# Patient Record
Sex: Male | Born: 1955 | Race: White | Hispanic: No | Marital: Married | Smoking: Never smoker
Health system: Southern US, Community
[De-identification: ages and names within clinical notes are randomized; demographics above are authoritative.]

## PROBLEM LIST (undated history)

## (undated) DIAGNOSIS — E119 Type 2 diabetes mellitus without complications: Secondary | ICD-10-CM

## (undated) DIAGNOSIS — C801 Malignant (primary) neoplasm, unspecified: Secondary | ICD-10-CM

## (undated) DIAGNOSIS — C159 Malignant neoplasm of esophagus, unspecified: Secondary | ICD-10-CM

## (undated) DIAGNOSIS — N189 Chronic kidney disease, unspecified: Secondary | ICD-10-CM

## (undated) DIAGNOSIS — M1990 Unspecified osteoarthritis, unspecified site: Secondary | ICD-10-CM

## (undated) DIAGNOSIS — I10 Essential (primary) hypertension: Secondary | ICD-10-CM

## (undated) DIAGNOSIS — E785 Hyperlipidemia, unspecified: Secondary | ICD-10-CM

## (undated) HISTORY — DX: Essential (primary) hypertension: I10

## (undated) HISTORY — DX: Hyperlipidemia, unspecified: E78.5

## (undated) HISTORY — DX: Type 2 diabetes mellitus without complications: E11.9

## (undated) HISTORY — DX: Malignant neoplasm of esophagus, unspecified: C15.9

---

## 2002-03-05 ENCOUNTER — Ambulatory Visit (HOSPITAL_COMMUNITY): Admission: RE | Admit: 2002-03-05 | Discharge: 2002-03-05 | Payer: Self-pay | Admitting: Internal Medicine

## 2002-03-05 ENCOUNTER — Encounter: Payer: Self-pay | Admitting: Internal Medicine

## 2006-03-27 ENCOUNTER — Ambulatory Visit: Payer: Self-pay | Admitting: Internal Medicine

## 2006-03-28 ENCOUNTER — Ambulatory Visit (HOSPITAL_COMMUNITY): Admission: RE | Admit: 2006-03-28 | Discharge: 2006-03-28 | Payer: Self-pay | Admitting: Internal Medicine

## 2006-03-28 ENCOUNTER — Ambulatory Visit: Payer: Self-pay | Admitting: Internal Medicine

## 2006-04-14 ENCOUNTER — Ambulatory Visit: Payer: Self-pay

## 2020-04-18 ENCOUNTER — Other Ambulatory Visit: Payer: Self-pay | Admitting: Internal Medicine

## 2020-04-19 ENCOUNTER — Other Ambulatory Visit: Payer: Self-pay | Admitting: Diagnostic Radiology

## 2020-04-19 ENCOUNTER — Other Ambulatory Visit: Payer: Self-pay | Admitting: Internal Medicine

## 2020-04-19 DIAGNOSIS — N281 Cyst of kidney, acquired: Secondary | ICD-10-CM

## 2020-04-19 DIAGNOSIS — N183 Chronic kidney disease, stage 3 unspecified: Secondary | ICD-10-CM

## 2020-05-11 ENCOUNTER — Ambulatory Visit
Admission: RE | Admit: 2020-05-11 | Discharge: 2020-05-11 | Disposition: A | Discharge status: Home / Self Care | Payer: 59 | Source: Ambulatory Visit | Attending: Internal Medicine | Admitting: Internal Medicine

## 2020-05-11 DIAGNOSIS — N281 Cyst of kidney, acquired: Secondary | ICD-10-CM

## 2020-05-11 MED ORDER — IOPAMIDOL (ISOVUE-300) INJECTION 61%
100.0000 mL | Freq: Once | INTRAVENOUS | Status: AC | PRN
  Administered 2020-05-11: 100 mL via INTRAVENOUS

## 2020-08-09 ENCOUNTER — Telehealth: Payer: Self-pay | Admitting: Oncology

## 2020-08-09 ENCOUNTER — Inpatient Hospital Stay: Payer: 59

## 2020-08-09 NOTE — Telephone Encounter (Signed)
Per Tanya/Patient (refused to wear a mask) - Canceled 3/23 Labs, 3/29 Follow Up Appt

## 2020-08-15 ENCOUNTER — Inpatient Hospital Stay: Payer: 59 | Admitting: Oncology

## 2021-03-22 ENCOUNTER — Telehealth: Payer: Self-pay | Admitting: Hematology and Oncology

## 2021-03-22 NOTE — Telephone Encounter (Signed)
Scheduled appt per 10/20 referral. Pt is aware of appt date and time.

## 2021-04-08 NOTE — Progress Notes (Signed)
  [facility] CONSULT NOTE  Patient Care Team: Patient, No Pcp Per (Inactive) as PCP - General (General Practice)  CHIEF COMPLAINTS/PURPOSE OF CONSULTATION:  Newly diagnosed protein electrophoresis abnormality  HISTORY OF PRESENTING ILLNESS:  Seth Burch 65 y.o. male is here because of recent diagnosis of  protein electrophoresis abnormality. He presents to the clinic today for initial evaluation and discussion of treatment options.  The entire work-up started because he has chronic kidney disease.  The differential was diabetic nephropathy versus any other etiology.  Work-up was performed which revealed an abnormal serum protein electrophoresis showing increased amounts of M protein.  This led to consultation for hematology.  I reviewed his records extensively and collaborated the history with the patient.  MEDICAL HISTORY:  Diabetes, diabetic nephropathy, hypertension  SURGICAL HISTORY:  no prior surgeries  SOCIAL HISTORY: Denies any tobacco or alcohol or recreational drug use He works as an Art gallery manager at [employer]. He does not have any exposure to chemicals.  FAMILY HISTORY: Grandmother may have passed away of cancer but he does not remember what kind.  No history of any blood disorders.    ALLERGIES:  has no allergies on file.  MEDICATIONS:    REVIEW OF SYSTEMS:   Constitutional: Denies fevers, chills or abnormal night sweats All other systems were reviewed with the patient and are negative.  PHYSICAL EXAMINATION: ECOG PERFORMANCE STATUS: 1 - Symptomatic but completely ambulatory  Vitals:   04/09/21 1527  BP: (!) 144/78  Pulse: 70  Resp: 18  Temp: 97.7 F (36.5 C)  SpO2: 99%   Filed Weights   04/09/21 1527  Weight: 219 lb 3.2 oz (99.4 kg)        RADIOGRAPHIC STUDIES: I have personally reviewed the radiological reports and agreed with the findings in the report.  ASSESSMENT AND PLAN:  MGUS (monoclonal gammopathy of  unknown significance) March 2021: M spike 0.7 g (kidney function started to decline, creatinine 2: Due to diabetes)    Counseling: I discussed with the patient the spectrum of disorders from MGUS to multiple myeloma. We discussed the role of plasma cells in producing immunoglobulins. We discussed structure of immunoglobulins on how they make up the heavy chains and the light chains. The light chains are Kappa and lambda. I discussed the difference between MGUS and multiple myeloma. MGUS is characterized by elevation monoclonal protein without any end organ damage. Multiple myeloma is associated with elevation monoclonal protein and end organ damage (hypercalcemia, renal dysfunction, anemia, bone lytic lesions ) along with a bone marrow showing greater than 10% plasma cells.  Workup recommended: 1. serum protein electrophoresis with immunofixation 2. kappa lambda ratio and beta-2 microglobulin 3. Bone survey   If the bone survey shows abnormality then we have to consider doing a bone marrow biopsy.  I will call him with the result of this test. The plan is to bring him back in 6 months with labs and a telephone visit 1 week afterwards to discuss results.   All questions were answered. The patient knows to call the clinic with any problems, questions or concerns.   Rulon Eisenmenger, MD, MPH 04/09/2021    I, Thana Ates, am acting as scribe for Nicholas Lose, MD.  I have reviewed the above documentation for accuracy and completeness, and I agree with the above.

## 2021-04-09 ENCOUNTER — Inpatient Hospital Stay: Payer: 59 | Attending: Hematology and Oncology | Admitting: Hematology and Oncology

## 2021-04-09 ENCOUNTER — Other Ambulatory Visit: Payer: Self-pay

## 2021-04-09 ENCOUNTER — Inpatient Hospital Stay: Payer: 59

## 2021-04-09 DIAGNOSIS — E1140 Type 2 diabetes mellitus with diabetic neuropathy, unspecified: Secondary | ICD-10-CM | POA: Insufficient documentation

## 2021-04-09 DIAGNOSIS — E1122 Type 2 diabetes mellitus with diabetic chronic kidney disease: Secondary | ICD-10-CM | POA: Diagnosis not present

## 2021-04-09 DIAGNOSIS — D472 Monoclonal gammopathy: Secondary | ICD-10-CM

## 2021-04-09 DIAGNOSIS — Z79899 Other long term (current) drug therapy: Secondary | ICD-10-CM | POA: Diagnosis not present

## 2021-04-09 DIAGNOSIS — N189 Chronic kidney disease, unspecified: Secondary | ICD-10-CM | POA: Insufficient documentation

## 2021-04-09 DIAGNOSIS — I129 Hypertensive chronic kidney disease with stage 1 through stage 4 chronic kidney disease, or unspecified chronic kidney disease: Secondary | ICD-10-CM | POA: Diagnosis not present

## 2021-04-09 NOTE — Assessment & Plan Note (Signed)
March 2021: M spike 0.7 g (kidney function started to decline, creatinine 2: Due to diabetes) was seen by Dr. Bobby Rumpf at [facility] (observation)

## 2021-04-10 LAB — KAPPA/LAMBDA LIGHT CHAINS
Kappa free light chain: 65.6 mg/L — ABNORMAL HIGH (ref 3.3–19.4)
Kappa, lambda light chain ratio: 5.56 — ABNORMAL HIGH (ref 0.26–1.65)
Lambda free light chains: 11.8 mg/L (ref 5.7–26.3)

## 2021-04-12 LAB — MULTIPLE MYELOMA PANEL, SERUM
Albumin SerPl Elph-Mcnc: 4 g/dL (ref 2.9–4.4)
Albumin/Glob SerPl: 1.4 (ref 0.7–1.7)
Alpha 1: 0.1 g/dL (ref 0.0–0.4)
Alpha2 Glob SerPl Elph-Mcnc: 0.9 g/dL (ref 0.4–1.0)
B-Globulin SerPl Elph-Mcnc: 1.1 g/dL (ref 0.7–1.3)
Gamma Glob SerPl Elph-Mcnc: 0.9 g/dL (ref 0.4–1.8)
Globulin, Total: 3 g/dL (ref 2.2–3.9)
IgA: 73 mg/dL (ref 61–437)
IgG (Immunoglobin G), Serum: 988 mg/dL (ref 603–1613)
IgM (Immunoglobulin M), Srm: 26 mg/dL (ref 20–172)
M Protein SerPl Elph-Mcnc: 0.7 g/dL — ABNORMAL HIGH
Total Protein ELP: 7 g/dL (ref 6.0–8.5)

## 2021-04-22 NOTE — Progress Notes (Incomplete)
°  HEMATOLOGY-ONCOLOGY TELEPHONE VISIT PROGRESS NOTE  I connected with Seth Burch on 04/23/2021 at  4:30 PM EST by telephone and verified that I am speaking with the correct person using two identifiers.  I discussed the limitations, risks, security and privacy concerns of performing an evaluation and management service by telephone and the availability of in person appointments.  I also discussed with the patient that there may be a patient responsible charge related to this service. The patient expressed understanding and agreed to proceed.   History of Present Illness: Seth Burch is a 65 y.o. male with above-mentioned history of protein electrophoresis abnormality. He presents via telephone for follow-up.   Observations/Objective:     Assessment Plan:  MGUS (monoclonal gammopathy of unknown significance) March 2021: M spike 0.7 g (kidney function started to decline, creatinine 2: Due to diabetes)   04/09/21: M Protein 0.7 gm, IgG Kappa, Kappa: 65.6, Ratio: 5.56  Bone Survey:Pending Discussed with the patient that the M-Protein has not changed and there fore we can see him again in 1 year with labs ahead of the visit.  RTC in 1 year    I discussed the assessment and treatment plan with the patient. The patient was provided an opportunity to ask questions and all were answered. The patient agreed with the plan and demonstrated an understanding of the instructions. The patient was advised to call back or seek an in-person evaluation if the symptoms worsen or if the condition fails to improve as anticipated.   Total time spent: *** mins including non-face to face time and time spent for planning, charting and coordination of care  Rulon Eisenmenger, MD 04/23/2021    I, Thana Ates, am acting as scribe for Nicholas Lose, MD.  {Add scribe attestation statement}

## 2021-04-22 NOTE — Assessment & Plan Note (Deleted)
March 2021: M spike 0.7 g (kidney function started to decline, creatinine 2: Due to diabetes)   04/09/21: M Protein 0.7 gm, IgG Kappa, Kappa: 65.6, Ratio: 5.56  Bone Survey:Pending Discussed with the patient that the M-Protein has not changed and there fore we can see him again in 1 year with labs ahead of the visit.  RTC in 1 year

## 2021-04-23 ENCOUNTER — Inpatient Hospital Stay: Payer: 59 | Admitting: Hematology and Oncology

## 2021-05-04 ENCOUNTER — Ambulatory Visit (HOSPITAL_COMMUNITY)
Admission: RE | Admit: 2021-05-04 | Discharge: 2021-05-04 | Disposition: A | Discharge status: Home / Self Care | Payer: 59 | Source: Ambulatory Visit | Attending: Hematology and Oncology | Admitting: Hematology and Oncology

## 2021-05-04 DIAGNOSIS — D472 Monoclonal gammopathy: Secondary | ICD-10-CM

## 2021-05-06 NOTE — Progress Notes (Signed)
°  HEMATOLOGY-ONCOLOGY TELEPHONE VISIT PROGRESS NOTE  I connected with Seth Burch on 05/07/2021 at 12:30 PM EST by telephone and verified that I am speaking with the correct person using two identifiers.  I discussed the limitations, risks, security and privacy concerns of performing an evaluation and management service by telephone and the availability of in person appointments.  I also discussed with the patient that there may be a patient responsible charge related to this service. The patient expressed understanding and agreed to proceed.   History of Present Illness: Seth Burch is a 65 y.o. male with above-mentioned history of  protein electrophoresis abnormality. He presents via telephone today for follow-up.  Observations/Objective: No new problems or concerns    Assessment Plan:  MGUS (monoclonal gammopathy of unknown significance) Lab Review March 2021: M spike 0.7 g (kidney function started to decline, creatinine 2: Due to diabetes) 04/09/21: M-Spike 0.7 gm, IgG Kappa, Kappa 65.6, Lambda 11.8, Ratio: 5.56   05/04/21: Bone Survey: Small possible lytic lesion on skull (this is related to prior trauma)   Plan: Labs in 1 year and follow-up a week later by telephone visit      I discussed the assessment and treatment plan with the patient. The patient was provided an opportunity to ask questions and all were answered. The patient agreed with the plan and demonstrated an understanding of the instructions. The patient was advised to call back or seek an in-person evaluation if the symptoms worsen or if the condition fails to improve as anticipated.   Total time spent: 11 mins including non-face to face time and time spent for planning, charting and coordination of care  Rulon Eisenmenger, MD 05/07/2021    I, Thana Ates, am acting as scribe for Nicholas Lose, MD.  I have reviewed the above documentation for accuracy and completeness, and I agree with the above.

## 2021-05-06 NOTE — Assessment & Plan Note (Signed)
Lab Review March 2021: M spike 0.7 g (kidney function started to decline, creatinine 2: Due to diabetes) 04/09/21: M-Spike 0.7 gm, IgG Kappa, Kappa 65.6, Lambda 11.8, Ratio: 5.56   05/04/21: Bone Survey: Small possible lytic lesion on skull  Workup recommended: Bone Marrow aspiration and Biopsy (Because of the lytic lesion)

## 2021-05-07 ENCOUNTER — Inpatient Hospital Stay: Payer: 59 | Attending: Hematology and Oncology | Admitting: Hematology and Oncology

## 2021-05-07 DIAGNOSIS — D472 Monoclonal gammopathy: Secondary | ICD-10-CM

## 2021-10-08 ENCOUNTER — Inpatient Hospital Stay: Payer: 59 | Attending: Hematology and Oncology

## 2021-10-08 ENCOUNTER — Other Ambulatory Visit: Payer: Self-pay | Admitting: *Deleted

## 2021-10-08 ENCOUNTER — Telehealth: Payer: Self-pay

## 2021-10-08 DIAGNOSIS — D472 Monoclonal gammopathy: Secondary | ICD-10-CM

## 2021-10-08 NOTE — Telephone Encounter (Signed)
Attempted to call pt regarding [place]/[facility] for lab appt today. LVM form pt to call back and R/S.

## 2021-10-10 NOTE — Progress Notes (Incomplete)
HEMATOLOGY-ONCOLOGY TELEPHONE VISIT PROGRESS NOTE  I connected with '@PTNAME'$ @ on 10/10/21 at  3:30 PM EDT by telephone and verified that I am speaking with the correct person using two identifiers.  I discussed the limitations, risks, security and privacy concerns of performing an evaluation and management service by telephone and the availability of in person appointments.  I also discussed with the patient that there may be a patient responsible charge related to this service. The patient expressed understanding and agreed to proceed.   History of Present Illness: Seth Burch is a 66 y.o. male with above-mentioned history of  protein electrophoresis abnormality. He presents via telephone today for follow-up.  Oncology History   No history exists.    REVIEW OF SYSTEMS:   Constitutional: Denies fevers, chills or abnormal weight loss Eyes: Denies blurriness of vision Ears, nose, mouth, throat, and face: Denies mucositis or sore throat Respiratory: Denies cough, dyspnea or wheezes Cardiovascular: Denies palpitation, chest discomfort Gastrointestinal:  Denies nausea, heartburn or change in bowel habits Skin: Denies abnormal skin rashes Lymphatics: Denies new lymphadenopathy or easy bruising Neurological:Denies numbness, tingling or new weaknesses Behavioral/Psych: Mood is stable, no new changes  Extremities: No lower extremity edema Breast: *** denies any pain or lumps or nodules in either breasts All other systems were reviewed with the patient and are negative. Observations/Objective:     Assessment Plan:  No problem-specific Assessment & Plan notes found for this encounter.    I discussed the assessment and treatment plan with the patient. The patient was provided an opportunity to ask questions and all were answered. The patient agreed with the plan and demonstrated an understanding of the instructions. The patient was advised to call back or seek an in-person evaluation if the  symptoms worsen or if the condition fails to improve as anticipated.   I provided *** minutes of non-face-to-face time during this encounter. Derrian Rodak Bryson Ha, CMA   I Gardiner Coins am scribing for Dr. Lindi Adie  ***

## 2021-10-16 ENCOUNTER — Inpatient Hospital Stay: Payer: 59 | Admitting: Hematology and Oncology

## 2021-10-16 DIAGNOSIS — D472 Monoclonal gammopathy: Secondary | ICD-10-CM

## 2021-10-16 NOTE — Progress Notes (Signed)
Patient did not respond to the phone call. He needs blood work first and then follow-up a week later to discuss results.

## 2021-10-16 NOTE — Assessment & Plan Note (Signed)
March 2021: M spike 0.7 g (kidney function started to decline, creatinine 2: Due to diabetes) 04/09/21: M-Spike 0.7 gm, IgG Kappa, Kappa 65.6, Lambda 11.8, Ratio: 5.56 05/04/21: Bone Survey: Small possible lytic lesion on skull (this is related to prior trauma)   Plan: Labs in 1 year and follow-up a week later by telephone visit

## 2022-01-05 IMAGING — CT CT ABD-PEL WO/W CM
3 of 7 series · 12 of 32 positions shown, 17 images · IV contrast (APPLIED)
Comparison: Ultrasound dated 04/03/2020

CLINICAL DATA: Renal cystic lesion workup

EXAM:
CT ABDOMEN AND PELVIS WITHOUT AND WITH CONTRAST
TECHNIQUE: Multidetector CT imaging of the abdomen and pelvis was performed
following the standard protocol before and following the bolus
administration of intravenous contrast.
CONTRAST:  100mL P49TPM-T33 IOPAMIDOL ([id number]) INJECTION 61%
Creatinine was obtained on site at [HOSPITAL] at [HOSPITAL].
Results: Creatinine 1.7 mg/dL.

[Series 2: abd w/(date) · axial · 0.90mm/px · z∈[-175,-82]mm · 2 of 94 slices shown]
[im 32/94  soft-tissue]
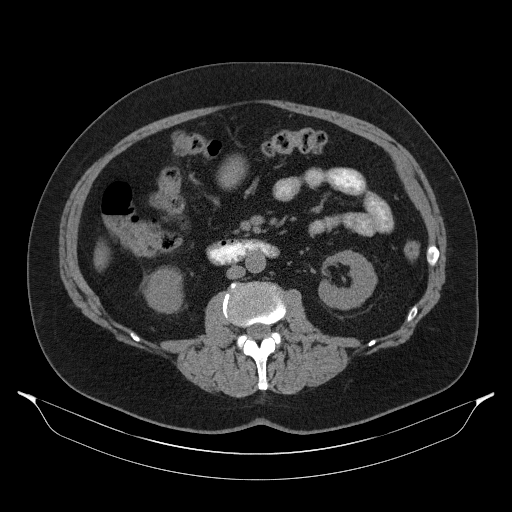
[im 63/94  soft-tissue]
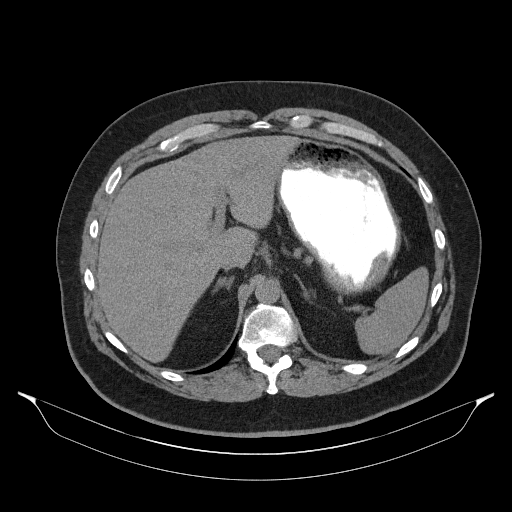

[Series 3: arterial · axial · arterial · 0.90mm/px · z∈[-199,-61]mm · 3 of 94 slices shown]
[im 24/94  soft-tissue]
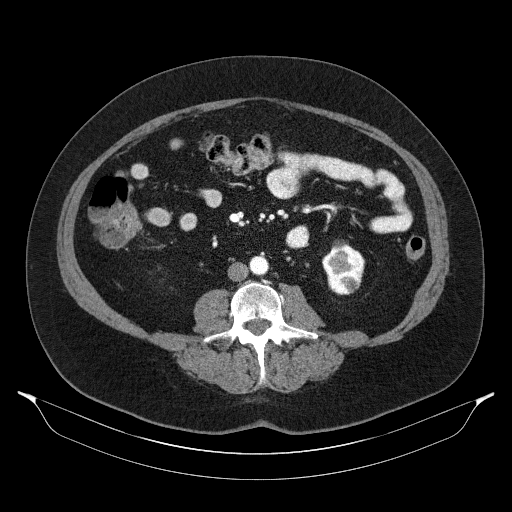
[im 47/94  soft-tissue]
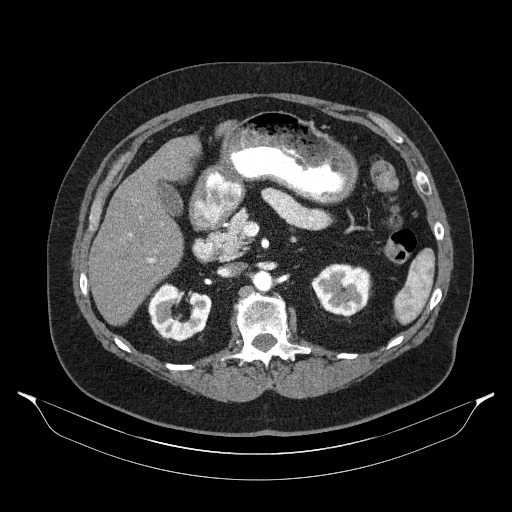
[im 70/94  soft-tissue]
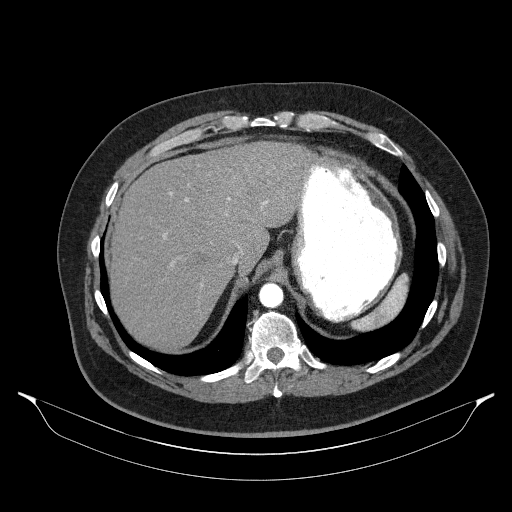

[Series 5: nephrographic · axial · 0.90mm/px · z∈[-412,-52]mm · 7 of 162 slices shown, 12 images]
[im 21/162  soft-tissue]
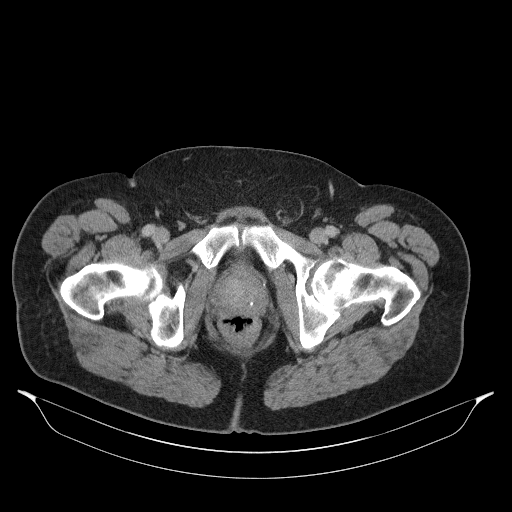
[im 21/162  bone]
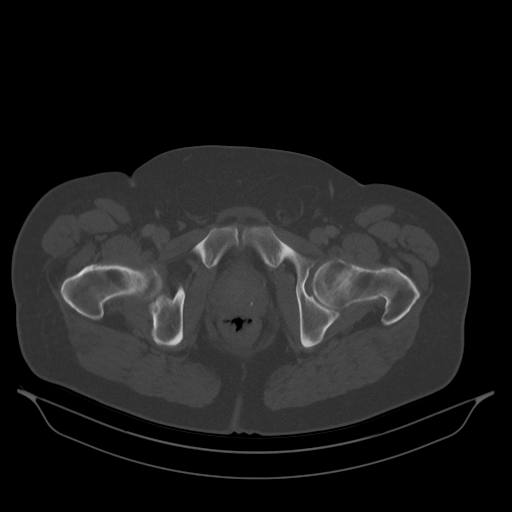
[im 41/162  soft-tissue]
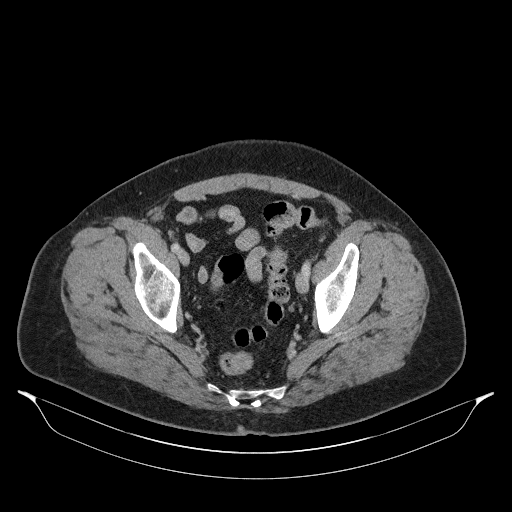
[im 61/162  soft-tissue]
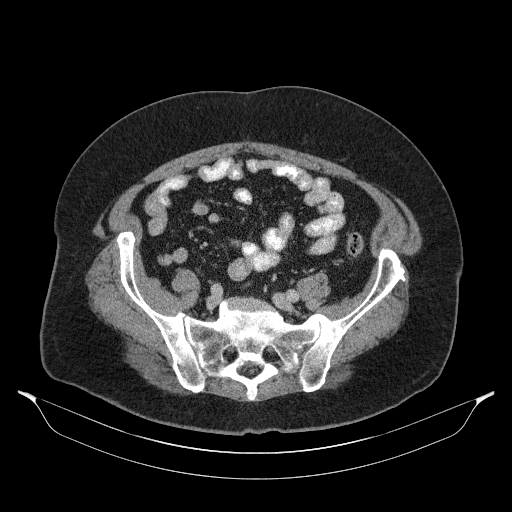
[im 81/162  soft-tissue]
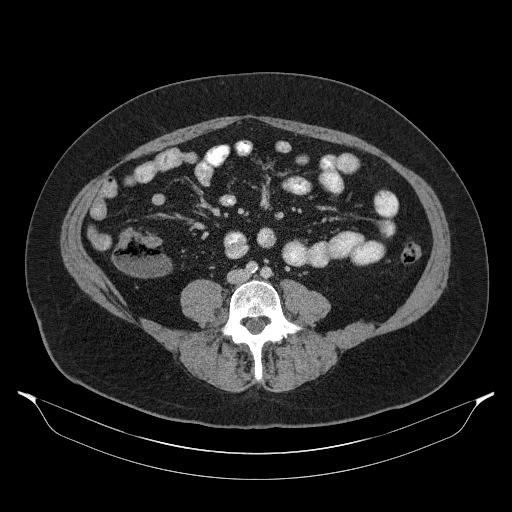
[im 81/162  lung]
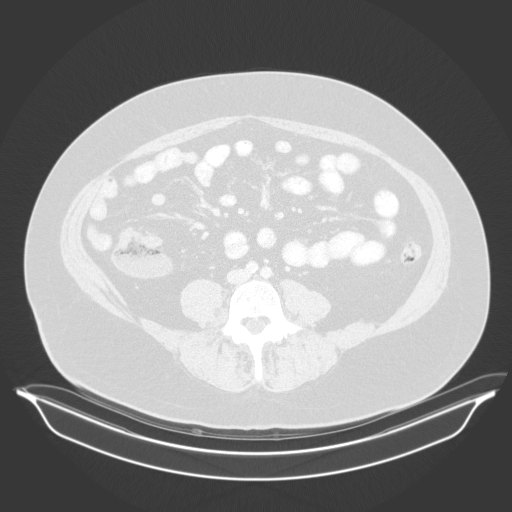
[im 101/162  soft-tissue]
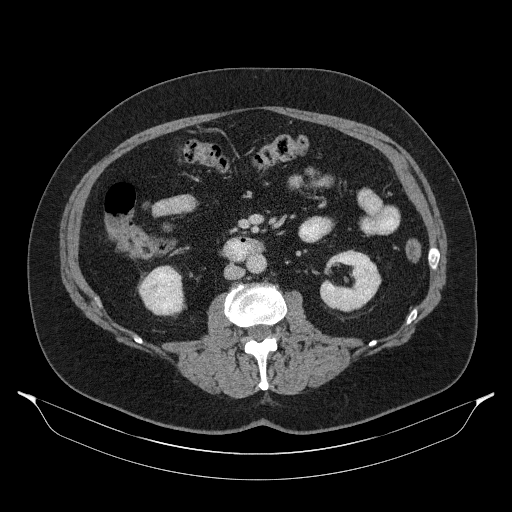
[im 101/162  lung]
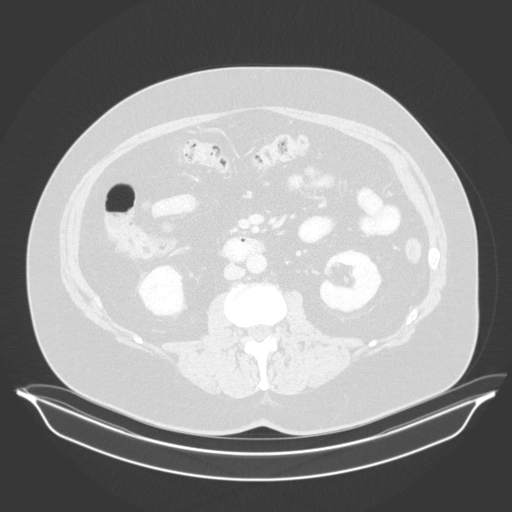
[im 121/162  soft-tissue]
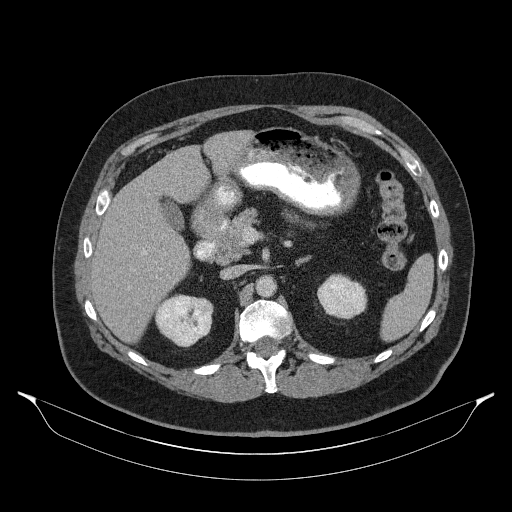
[im 121/162  lung]
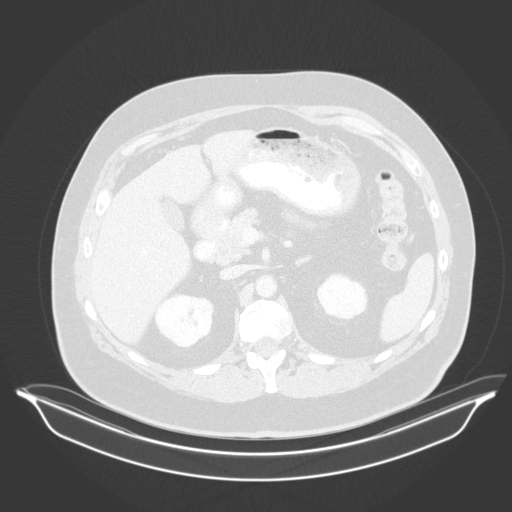
[im 141/162  soft-tissue]
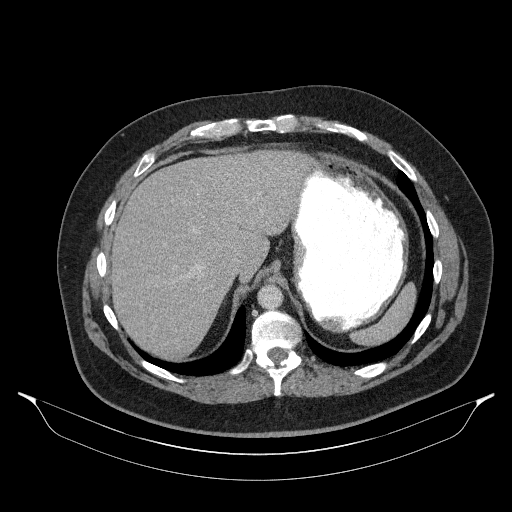
[im 141/162  lung]
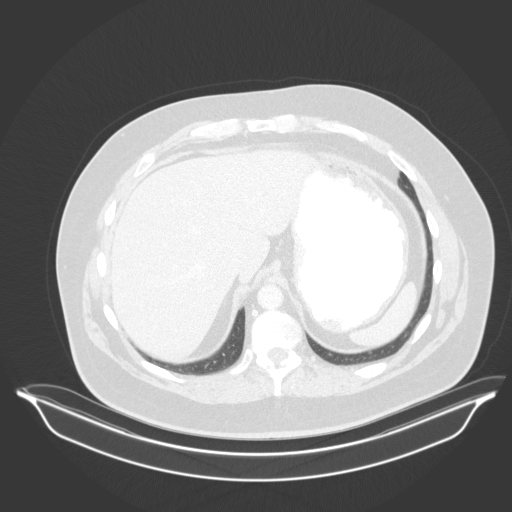

[12 of 32 positions shown; findings below may reference images not displayed]

FINDINGS: Lower chest: Small type 1 hiatal hernia. Mild distal esophageal wall
thickening, esophagitis would be a common cause.

Hepatobiliary: 4 mm hypodensity along the anterior right hepatic
lobe capsule on image 12 of series 5, likely a tiny cyst although
technically nonspecific due to small size.

The gallbladder appears unremarkable. No biliary dilatation. The
liver appears otherwise normal.

Pancreas: Unremarkable

Spleen: Unremarkable

Adrenals/Urinary Tract: No hydronephrosis or proximal hydroureter.
2.6 by 1.7 cm left peripelvic cyst on image 24 series May 26, 14 by
cm anterior cyst of the left kidney lower pole, and 1.4 by 1.0 cm
posterior cyst of the left kidney lower pole. These are Bosniak
category 1.

There several small hypodense lesions in the right kidney which are
7 mm or less in diameter and technically too small to characterize,
although statistically likely to be benign. No urinary tract
calculi. Urinary bladder appears unremarkable.

Stomach/Bowel: Unremarkable

Vascular/Lymphatic: Aortoiliac atherosclerotic vascular disease.

Reproductive: Borderline prostatomegaly.

Other: No supplemental non-categorized findings.

Musculoskeletal: Mild bridging spurring of both sacroiliac joints.
IMPRESSION: 1. There are 3 Bosniak category 1 cysts of the left kidney including
a parapelvic cyst. No hydronephrosis or urinary tract calculi.
2. Small type 1 hiatal hernia. Mild distal esophageal wall
thickening, esophagitis would be a common cause.
3. Borderline prostatomegaly.
4. Mild bridging spurring of both sacroiliac joints.
5. Aortic atherosclerosis.

Aortic Atherosclerosis ([id number]).

## 2022-04-30 ENCOUNTER — Inpatient Hospital Stay: Payer: 59 | Attending: Hematology and Oncology

## 2022-05-07 ENCOUNTER — Inpatient Hospital Stay: Payer: 59 | Admitting: Hematology and Oncology

## 2022-05-07 ENCOUNTER — Telehealth: Payer: Self-pay | Admitting: *Deleted

## 2022-05-07 NOTE — Telephone Encounter (Signed)
RN placed call to pt regarding the need to cancel appt for today.  Pt did not show for labs work and provider requesting to reschedule since lab work is needed prior.  Pt notified and stated he wished to not continue with following up with our office at this time.  RN encouraged pt to contact our office when he would like to be seen in the future.  Pt verbalized understanding.

## 2023-03-11 DIAGNOSIS — C155 Malignant neoplasm of lower third of esophagus: Secondary | ICD-10-CM | POA: Insufficient documentation

## 2023-03-27 ENCOUNTER — Encounter: Payer: Self-pay | Admitting: Oncology

## 2023-03-27 ENCOUNTER — Inpatient Hospital Stay: Payer: 59

## 2023-03-27 ENCOUNTER — Other Ambulatory Visit: Payer: Self-pay

## 2023-03-27 ENCOUNTER — Inpatient Hospital Stay: Payer: 59 | Attending: Oncology | Admitting: Oncology

## 2023-03-27 VITALS — BP 129/76 | HR 74 | Temp 98.9°F | Resp 17 | Wt 185.9 lb

## 2023-03-27 DIAGNOSIS — D472 Monoclonal gammopathy: Secondary | ICD-10-CM

## 2023-03-27 DIAGNOSIS — R634 Abnormal weight loss: Secondary | ICD-10-CM | POA: Diagnosis not present

## 2023-03-27 DIAGNOSIS — Z7952 Long term (current) use of systemic steroids: Secondary | ICD-10-CM | POA: Insufficient documentation

## 2023-03-27 DIAGNOSIS — I10 Essential (primary) hypertension: Secondary | ICD-10-CM | POA: Diagnosis not present

## 2023-03-27 DIAGNOSIS — C155 Malignant neoplasm of lower third of esophagus: Secondary | ICD-10-CM

## 2023-03-27 DIAGNOSIS — E785 Hyperlipidemia, unspecified: Secondary | ICD-10-CM | POA: Diagnosis not present

## 2023-03-27 DIAGNOSIS — Z7984 Long term (current) use of oral hypoglycemic drugs: Secondary | ICD-10-CM | POA: Insufficient documentation

## 2023-03-27 DIAGNOSIS — R1319 Other dysphagia: Secondary | ICD-10-CM

## 2023-03-27 DIAGNOSIS — Z79899 Other long term (current) drug therapy: Secondary | ICD-10-CM | POA: Diagnosis not present

## 2023-03-27 DIAGNOSIS — E119 Type 2 diabetes mellitus without complications: Secondary | ICD-10-CM | POA: Diagnosis not present

## 2023-03-27 DIAGNOSIS — R1310 Dysphagia, unspecified: Secondary | ICD-10-CM | POA: Insufficient documentation

## 2023-03-27 LAB — COMPREHENSIVE METABOLIC PANEL
ALT: 13 U/L (ref 0–44)
AST: 16 U/L (ref 15–41)
Albumin: 4.4 g/dL (ref 3.5–5.0)
Alkaline Phosphatase: 132 U/L — ABNORMAL HIGH (ref 38–126)
Anion gap: 6 (ref 5–15)
BUN: 20 mg/dL (ref 8–23)
CO2: 27 mmol/L (ref 22–32)
Calcium: 9.4 mg/dL (ref 8.9–10.3)
Chloride: 107 mmol/L (ref 98–111)
Creatinine, Ser: 1.41 mg/dL — ABNORMAL HIGH (ref 0.61–1.24)
GFR, Estimated: 55 mL/min — ABNORMAL LOW (ref >60)
Glucose, Bld: 127 mg/dL — ABNORMAL HIGH (ref 70–99)
Potassium: 4.1 mmol/L (ref 3.5–5.1)
Sodium: 140 mmol/L (ref 135–145)
Total Bilirubin: 0.5 mg/dL (ref <1.2)
Total Protein: 8 g/dL (ref 6.5–8.1)

## 2023-03-27 LAB — CBC WITH DIFFERENTIAL/PLATELET
Abs Immature Granulocytes: 0.01 10*3/uL (ref 0.00–0.07)
Basophils Absolute: 0.1 10*3/uL (ref 0.0–0.1)
Basophils Relative: 1 %
Eosinophils Absolute: 0.2 10*3/uL (ref 0.0–0.5)
Eosinophils Relative: 3 %
HCT: 40.8 % (ref 39.0–52.0)
Hemoglobin: 13.5 g/dL (ref 13.0–17.0)
Immature Granulocytes: 0 %
Lymphocytes Relative: 29 %
Lymphs Abs: 1.9 10*3/uL (ref 0.7–4.0)
MCH: 29.3 pg (ref 26.0–34.0)
MCHC: 33.1 g/dL (ref 30.0–36.0)
MCV: 88.7 fL (ref 80.0–100.0)
Monocytes Absolute: 0.6 10*3/uL (ref 0.1–1.0)
Monocytes Relative: 9 %
Neutro Abs: 3.9 10*3/uL (ref 1.7–7.7)
Neutrophils Relative %: 58 %
Platelets: 279 10*3/uL (ref 150–400)
RBC: 4.6 MIL/uL (ref 4.22–5.81)
RDW: 13.4 % (ref 11.5–15.5)
WBC: 6.7 10*3/uL (ref 4.0–10.5)
nRBC: 0 % (ref 0.0–0.2)

## 2023-03-27 LAB — IRON AND IRON BINDING CAPACITY (CC-WL,HP ONLY)
Iron: 58 ug/dL (ref 45–182)
Saturation Ratios: 13 % — ABNORMAL LOW (ref 17.9–39.5)
TIBC: 434 ug/dL (ref 250–450)
UIBC: 376 ug/dL (ref 117–376)

## 2023-03-27 LAB — FERRITIN: Ferritin: 28 ng/mL (ref 24–336)

## 2023-03-27 LAB — LACTATE DEHYDROGENASE: LDH: 146 U/L (ref 98–192)

## 2023-03-27 LAB — MAGNESIUM: Magnesium: 2.3 mg/dL (ref 1.7–2.4)

## 2023-03-27 MED ORDER — ONDANSETRON HCL 8 MG PO TABS: 8.0000 mg | ORAL_TABLET | Freq: Three times a day (TID) | ORAL | 1 refills | Status: DC | PRN

## 2023-03-27 MED ORDER — LIDOCAINE-PRILOCAINE 2.5-2.5 % EX CREA: TOPICAL_CREAM | CUTANEOUS | 3 refills | Status: DC

## 2023-03-27 MED ORDER — DEXAMETHASONE 4 MG PO TABS: ORAL_TABLET | ORAL | 1 refills | Status: DC

## 2023-03-27 MED ORDER — PROCHLORPERAZINE MALEATE 10 MG PO TABS: 10.0000 mg | ORAL_TABLET | Freq: Four times a day (QID) | ORAL | 1 refills | Status: DC | PRN

## 2023-03-27 NOTE — Assessment & Plan Note (Signed)
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  Concern for lymph node involvement based on CT scan.  Significant weight loss (40-50 lbs) over the past 9-10 months.  - Dysphagia with pain, particularly with sticky foods. No vomiting, hematemesis, or melena. CT scan shows a lymph node in the vicinity of the esophagus, suggesting at least stage II or III disease. -Today I discussed tentative staging, prognosis, plan of care, treatment options with the patient and his wife, pending.  Reviewed NCCN guidelines.  Explained to them that treatment of esophageal cancer involves multidisciplinary approach including cardiothoracic surgery, medical oncology, radiation oncology, gastroenterology.  His case will be presented at GI tumor conference. -Request submitted for staging PET/CT. -I discussed his case with Dr. Jennye Boroughs today.  Since they do not perform EUS, referral sent to local GI Dr. Jeani Hawking for possible EUS to help with staging. -Referral sent to cardiothoracic surgeon Dr. Cliffton Asters and to Dr. Mitzi Hansen, radiation oncologist for concurrent chemoradiation planning in the neoadjuvant setting. -Discussed different treatment options.  Discussed risks versus benefits with each modality/regimen. Plan to start chemotherapy with carboplatin and paclitaxel, once weekly, in conjunction with radiation therapy. -Explained to him that he may need laparoscopic staging prior to treatment initiation but we will defer this to Dr. Cliffton Asters. -Order additional testing on biopsy specimen for potential targeted therapy options including MMR testing, PD L1 testing -Plan for potential surgery following chemoradiation, pending further staging and response to treatment. -He was advised to continue omeprazole daily.

## 2023-03-27 NOTE — Progress Notes (Signed)
Seth Burch  ONCOLOGY CONSULT NOTE   PATIENT NAME: Seth Burch   MR#: [id number] DOB: 11-02-1955  DATE OF SERVICE: 03/27/2023   REFERRING PHYSICIAN  Seth Roof, MD, Gastroenterology.   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  Discussed the use of AI scribe software for clinical note transcription with the patient, who gave verbal consent to proceed.   I have reviewed his chart and materials related to his cancer extensively and collaborated history with the patient. 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 Jennye Burch after he presented to his PCP with complaints of dysphagia and weight loss.  He started noticing difficulty in swallowing in January or February 2024.  He also reported unintentional weight loss of about 20 pounds in 1 year.  Previously he used to take ranitidine for intermittent acid reflux but it was stopped approximately in 2018.  He was started on omeprazole and an EGD was planned for further evaluation.  Also screening colonoscopy was recommended since last colonoscopy was in 2012.   03/11/2023 Pathology Results   Biopsy of stomach/gastric cardia: Cardiac mucosa showing at least intramucosal adenocarcinoma.  Biopsy of esophageal mass: Invasive, moderately  differentiated adenocarcinoma.   03/11/2023 Procedure   EGD with biopsy by Seth Burch: Large mass in the distal esophagus between 30 and 38 cm.  Probable infiltration of the mass in the gastric cardia.  Colonoscopy on 03/11/2023: Mild diverticular disease in the left colon.   03/14/2023 Imaging   CT chest, abdomen and pelvis: Circumferential, masslike thickening in the lower third of esophagus, 7 cm in length, consistent with known primary esophageal malignancy.  Enlarged right paraesophageal lymph node measuring 1.2 x 0.8 cm, consistent with nodal metastatic disease.  No other evidence of lymphadenopathy or metastatic disease in the chest, abdomen or pelvis.  Background of very fine centrilobular pulmonary nodules, most concentrated in the lung apices as well as mild diffuse bronchial wall thickening, most commonly seen in smoking-related respiratory bronchiolitis.  Prostatomegaly.   04/10/2023 -  Chemotherapy   Patient is on Treatment Plan : ESOPHAGUS Carboplatin + Paclitaxel Weekly X 6 Weeks with XRT       INTERVAL HISTORY:  He reports difficulty swallowing that started at the beginning of the year. The patient reports that the condition has not worsened but remains consistent. Certain foods, particularly sticky ones, cause discomfort and pain during swallowing. The patient describes the pain as if someone is squeezing the top of their stomach with their fingernails. The patient has also experienced significant weight loss, approximately 40-50 pounds since January. The patient denies any nausea, vomiting, or blood in stools. The patient has regular bowel movements and no blood in stools. The patient used to experience frequent acid reflux and took ranitidine regularly, but the frequency of acid reflux has decreased significantly. The patient reports no pain in the stomach area except when swallowing certain  foods, particularly bread-type products. The patient has no issues swallowing liquids,  non-carbonated drinks, and protein shakes.  MEDICAL HISTORY:  Past Medical History:  Diagnosis Date   Diabetes mellitus (HCC)    Dyslipidemia    Hypertension     SURGICAL HISTORY: History reviewed. No pertinent surgical history.  SOCIAL HISTORY: Social History   Socioeconomic History   Marital status: Married    Spouse name: Not on file   Number of children: Not on file   Years of education: Not on file   Highest education level: Not on file  Occupational History   Not on file  Tobacco Use   Smoking status: Never   Smokeless tobacco: Never  Substance and Sexual Activity   Alcohol use: Yes    Alcohol/week: 1.0 standard drink of alcohol    Types: 1 Cans of beer per week   Drug use: Never   Sexual activity: Not on file  Other Topics Concern   Not on file  Social History Narrative   Not on file   Social Determinants of Health   Financial Resource Strain: Not on file  Food Insecurity: No Food Insecurity (03/27/2023)   Hunger Vital Sign    Worried About Running Out of Food in the Last Year: Never true    Ran Out of Food in the Last Year: Never true  Transportation Needs: No Transportation Needs (03/27/2023)   PRAPARE - Administrator, Civil Service (Medical): No    Lack of Transportation (Non-Medical): No  Physical Activity: Not on file  Stress: Not on file  Social Connections: Not on file  Intimate Partner Violence: Not At Risk (03/27/2023)   Humiliation, Afraid, Rape, and Kick questionnaire    Fear of Current or Ex-Partner: No    Emotionally Abused: No    Physically Abused: No    Sexually Abused: No    FAMILY HISTORY: History reviewed. No pertinent family history.  ALLERGIES:  He is allergic to no known allergies.  MEDICATIONS:  Current Outpatient Medications  Medication Sig Dispense Refill   ACCU-CHEK GUIDE test strip 1 each by Other route 2 (two) times daily.     amLODipine (NORVASC) 5 MG tablet Take 1 tablet by mouth daily.     Aspirin 81  MG CAPS Take 81 mg by mouth daily.     atorvastatin (LIPITOR) 20 MG tablet Take 1 tablet by mouth daily.     JARDIANCE 10 MG TABS tablet Take 1 tablet by mouth daily.     LANTUS SOLOSTAR 100 UNIT/ML Solostar Pen Inject 26 Units into the skin daily.     metFORMIN (GLUCOPHAGE) 500 MG tablet Take 2 tablets by mouth daily.     omeprazole (PRILOSEC) 40 MG capsule Take 40 mg by mouth daily.     ramipril (ALTACE) 10 MG capsule Take 2 capsules by mouth daily.     tamsulosin (FLOMAX) 0.4 MG CAPS capsule Take 1 capsule by mouth daily.     dexamethasone (DECADRON) 4 MG tablet Take 2 tablets daily for 2 days, start the day after chemotherapy. Take with food. 30 tablet 1   lidocaine-prilocaine (EMLA) cream Apply to affected area once 30 g 3   ondansetron (ZOFRAN) 8 MG tablet Take 1 tablet (8 mg total) by mouth every 8 (eight) hours as needed for nausea or vomiting. Start on the third day after chemotherapy. 30 tablet 1   prochlorperazine (COMPAZINE) 10 MG tablet Take 1 tablet (10 mg total) by mouth every 6 (six) hours as  needed for nausea or vomiting. 30 tablet 1   No current facility-administered medications for this visit.    REVIEW OF SYSTEMS:    Review of Systems - Oncology  All other pertinent systems were reviewed with the patient and are negative.  PHYSICAL EXAMINATION:  ECOG PERFORMANCE STATUS: 1 - Symptomatic but completely ambulatory  Vitals:   03/27/23 1114  BP: 129/76  Pulse: 74  Resp: 17  Temp: 98.9 F (37.2 C)  SpO2: 100%   Filed Weights   03/27/23 1114  Weight: 185 lb 14.4 oz (84.3 kg)    Physical Exam Constitutional:      General: He is not in acute distress.    Appearance: Normal appearance.  HENT:     Head: Normocephalic and atraumatic.  Eyes:     General: No scleral icterus.    Conjunctiva/sclera: Conjunctivae normal.  Cardiovascular:     Rate and Rhythm: Normal rate and regular rhythm.     Heart sounds: Normal heart sounds.  Pulmonary:     Effort: Pulmonary  effort is normal.     Breath sounds: Normal breath sounds.  Abdominal:     General: There is no distension.     Palpations: Abdomen is soft. There is no mass.  Musculoskeletal:     Right lower leg: No edema.     Left lower leg: No edema.  Lymphadenopathy:     Cervical: No cervical adenopathy.  Neurological:     General: No focal deficit present.     Mental Status: He is alert and oriented to person, place, and time.  Psychiatric:        Mood and Affect: Mood normal.        Behavior: Behavior normal.        Thought Content: Thought content normal.     LABORATORY DATA:   I have reviewed the data as listed.  Results for orders placed or performed in visit on 03/27/23  Burch and Burch Binding Capacity ([facility] only)  Result Value Ref Range   Burch 58 45 - 182 ug/dL   TIBC 161 096 - 045 ug/dL   Saturation Ratios 13 (L) 17.9 - 39.5 %   UIBC 376 117 - 376 ug/dL  Lactate dehydrogenase  Result Value Ref Range   LDH 146 98 - 192 U/L  Magnesium  Result Value Ref Range   Magnesium 2.3 1.7 - 2.4 mg/dL  Comprehensive metabolic panel  Result Value Ref Range   Sodium 140 135 - 145 mmol/L   Potassium 4.1 3.5 - 5.1 mmol/L   Chloride 107 98 - 111 mmol/L   CO2 27 22 - 32 mmol/L   Glucose, Bld 127 (H) 70 - 99 mg/dL   BUN 20 8 - 23 mg/dL   Creatinine, Ser 4.09 (H) 0.61 - 1.24 mg/dL   Calcium 9.4 8.9 - 81.1 mg/dL   Total Protein 8.0 6.5 - 8.1 g/dL   Albumin 4.4 3.5 - 5.0 g/dL   AST 16 15 - 41 U/L   ALT 13 0 - 44 U/L   Alkaline Phosphatase 132 (H) 38 - 126 U/L   Total Bilirubin 0.5 <1.2 mg/dL   GFR, Estimated 55 (L) >60 mL/min   Anion gap 6 5 - 15  CBC with Differential/Platelet  Result Value Ref Range   WBC 6.7 4.0 - 10.5 K/uL   RBC 4.60 4.22 - 5.81 MIL/uL   Hemoglobin 13.5 13.0 - 17.0 g/dL   HCT 91.4 78.2 - 95.6 %   MCV 88.7 80.0 -  100.0 fL   MCH 29.3 26.0 - 34.0 pg   MCHC 33.1 30.0 - 36.0 g/dL   RDW 16.1 09.6 - 04.5 %   Platelets 279 150 - 400 K/uL   nRBC 0.0 0.0 - 0.2 %    Neutrophils Relative % 58 %   Neutro Abs 3.9 1.7 - 7.7 K/uL   Lymphocytes Relative 29 %   Lymphs Abs 1.9 0.7 - 4.0 K/uL   Monocytes Relative 9 %   Monocytes Absolute 0.6 0.1 - 1.0 K/uL   Eosinophils Relative 3 %   Eosinophils Absolute 0.2 0.0 - 0.5 K/uL   Basophils Relative 1 %   Basophils Absolute 0.1 0.0 - 0.1 K/uL   Immature Granulocytes 0 %   Abs Immature Granulocytes 0.01 0.00 - 0.07 K/uL      RADIOGRAPHIC STUDIES:  I have personally reviewed the CT chest, abdomen pelvis report from outside facility dated 03/14/2023.  ASSESSMENT & PLAN:   67 y.o. 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  Concern for lymph node involvement based on CT scan.  Significant weight loss (40-50 lbs) over the past 9-10 months.  - Dysphagia with pain, particularly with sticky foods. No vomiting, hematemesis, or melena. CT scan shows a lymph node in the vicinity of the esophagus, suggesting at least stage II or III disease. -Today I discussed tentative staging, prognosis, plan of care, treatment options with the patient and his wife, pending.  Reviewed NCCN guidelines.  Explained to them that treatment of esophageal cancer involves multidisciplinary approach including cardiothoracic surgery, medical oncology, radiation oncology, gastroenterology.  His case will be presented at GI tumor conference. -Request submitted for staging PET/CT. -I discussed his case with Dr. Jennye Burch today.  Since they do not perform EUS, referral sent to local GI Dr. Jeani Hawking for possible EUS to help with staging. -Referral sent to cardiothoracic surgeon Dr. Cliffton Asters and to Dr. Mitzi Hansen, radiation oncologist for concurrent  chemoradiation planning in the neoadjuvant setting. -Discussed different treatment options.  Discussed risks versus benefits with each modality/regimen. Plan to start chemotherapy with carboplatin and paclitaxel, once weekly, in conjunction with radiation therapy. -Explained to him that he may need laparoscopic staging prior to treatment initiation but we will defer this to Dr. Cliffton Asters. -Order additional testing on biopsy specimen for potential targeted therapy options including MMR testing, PD L1 testing -Plan for potential surgery following chemoradiation, pending further staging and response to treatment. -He was advised to continue omeprazole daily.  MGUS (monoclonal gammopathy of unknown significance) -He has been on surveillance for IgG kappa MGUS, last evaluation in August 2022.  Repeated myeloma labs today.  Will follow-up.  We will arrange for chemo education.  Plan to start chemoradiation in the next 3 to 4 weeks, pending above workup/procedures.  He will also need Port-A-Cath placement and feeding tube placement.  Will discuss this with cardiothoracic surgery department.  The total time spent in the appointment was 60 minutes encounter with the patient, including review of chart and results of various tests, discussion about plan of care and coordination of care plan.  I reviewed lab results and outside records for this visit and discussed relevant results with the patient. Diagnosis, plan of care and treatment options were also discussed in detail with the patient.  Opportunity provided to ask questions and answers provided to his apparent satisfaction. Provided instructions to call our clinic with any problems, questions or concerns prior to return visit. I recommended to continue follow-up with PCP and sub-specialists. He verbalized understanding and agreed with the plan. No barriers to learning was detected.  NCCN guidelines have been consulted in the planning of this patient's  care.  Meryl Crutch, MD  03/27/2023 2:38 PM  [facility] - [facility] [address] [place] [address] Dept: [phone] Dept Fax: [phone]   Orders Placed This Encounter  Procedures   Consent Attestation for Oncology Treatment    Order Specific Question:   The patient is informed of risks, benefits, side-effects of the prescribed oncology treatment. Potential short term and long term side effects and response rates discussed. After a long discussion, the patient made informed decision to proceed.    Answer:   Yes   CBC with Differential/Platelet    Standing Status:   Future    Number of Occurrences:   1    Standing Expiration Date:   03/26/2024   Comprehensive metabolic panel    Standing Status:   Future    Number of Occurrences:   1    Standing Expiration Date:   03/26/2024   Magnesium    Standing Status:   Future    Number of Occurrences:   1    Standing Expiration Date:   03/26/2024   Lactate dehydrogenase    Standing Status:   Future    Number of Occurrences:   1    Standing Expiration Date:   03/26/2024   Ferritin    Standing Status:   Future    Number of Occurrences:   1    Standing Expiration Date:   03/26/2024   Burch and Burch Binding Capacity ([facility] only)    Standing Status:   Future    Number of Occurrences:   1    Standing Expiration Date:   03/26/2024   Multiple Myeloma Panel (SPEP&IFE w/QIG)    Standing Status:   Future    Number of Occurrences:   1    Standing Expiration Date:   03/26/2024   Kappa/lambda light chains    Standing Status:   Future    Number of Occurrences:   1    Standing Expiration Date:   03/26/2024   [facility] PHYSICIAN COMMUNICATION 1    0 Number of doses of carboplatin received at [facility] or outside facility. Nahara Dona. signature of Provider. If patient has received greater than 5 doses of carboplatin, the following pre-medications should be ordered: dexamethasone,  diphenhydramine, and formulary histamine H2 antagonist. If patient cannot tolerate oral histamine H2 antagonist, IV may be given.     CODE STATUS:   Future Appointments  Date Time Provider Department [facility]  04/03/2023  4:00 PM [facility] CHEMO [facility]U [facility] None      This document was completed utilizing speech recognition software. Grammatical errors, random word insertions, pronoun errors, and incomplete sentences are an occasional consequence of this system due to software limitations, ambient noise, and hardware issues. Any formal questions or concerns about the content, text or information contained within the body of this dictation should be directly addressed to the provider for clarification.

## 2023-03-27 NOTE — Assessment & Plan Note (Signed)
-  He has been on surveillance for IgG kappa MGUS, last evaluation in August 2022.  Repeated myeloma labs today.  Will follow-up.

## 2023-03-28 ENCOUNTER — Other Ambulatory Visit: Payer: Self-pay

## 2023-03-28 LAB — KAPPA/LAMBDA LIGHT CHAINS
Kappa free light chain: 89.1 mg/L — ABNORMAL HIGH (ref 3.3–19.4)
Kappa, lambda light chain ratio: 5.24 — ABNORMAL HIGH (ref 0.26–1.65)
Lambda free light chains: 17 mg/L (ref 5.7–26.3)

## 2023-03-29 ENCOUNTER — Telehealth: Payer: Self-pay | Admitting: Oncology

## 2023-03-29 NOTE — Telephone Encounter (Signed)
Scheduled per 11/07 los, spoke with patient's spouse regarding upcoming appointments. Informed patient's spouse, we are still trying to work patient's infusion appointments in and they will receive a separate call when that is made.

## 2023-03-31 ENCOUNTER — Other Ambulatory Visit: Payer: Self-pay | Admitting: Radiation Oncology

## 2023-03-31 ENCOUNTER — Inpatient Hospital Stay
Admission: RE | Admit: 2023-03-31 | Discharge: 2023-03-31 | Disposition: A | Discharge status: Home / Self Care | Payer: Self-pay | Source: Ambulatory Visit | Attending: Radiation Oncology | Admitting: Radiation Oncology

## 2023-03-31 DIAGNOSIS — C155 Malignant neoplasm of lower third of esophagus: Secondary | ICD-10-CM

## 2023-03-31 NOTE — Progress Notes (Signed)
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   HISTORY OF PRESENT ILLNESS: Seth Burch is a 67 y.o. male seen at Seth request of Dr. Arlana Pouch for a newly diagnosed distal esophagus cancer.  Seth Burch initially presented to Seth primary care doctor after complaining of dysphagia and weight loss since January or February 2024 with approximately 20 pounds of unintended weight loss.  He had a history of intermittent reflux and had been taking H2 blockers in Seth past and was started on omeprazole and referred to GI.  He underwent endoscopy and colonoscopy in [place] with Dr. Jennye Boroughs on 03/11/2023.  On endoscopy he had a mass seated in Seth distal esophagus between 30 and 38 cm from Seth incisors that was necrotic and infiltrating and some oozing prior to Seth passing of Seth endoscope was noted, Seth remainder of Seth exam showed infiltrative mass extending into Seth gastric cardia and normal-appearing duodenum.  No abnormalities were seen on colonoscopy.  Final pathology showed invasive moderately differentiated adenocarcinoma of Seth esophagus specimen and at least intramucosal adenocarcinoma of Seth gastric cardia biopsy.  He has undergone additional imaging with CT of Seth chest abdomen and pelvis on 03/14/2023, this showed circumferential masslike thickening of Seth lower third of Seth esophagus measuring 7 cm in length with an enlarged right paraesophageal lymph node measuring 1.2 cm a background of very fine centrilobular pulmonary nodules most contracted send treated in Seth lung apices and mild diffuse bronchial wall thickening was noted, no other findings concerning for metastatic disease were  appreciated and he had prostatic megaly and atherosclerotic changes.  He met with Dr. Arlana Pouch, and a PET scan is scheduled for Monday, and is scheduled for EUS with Dr. Elnoria Howard tomorrow, and will see Dr. Cliffton Asters 04/25/23 to consider esophagectomy after neoadjuvant treatment. He's seen today to discuss chemoradiation.     PREVIOUS RADIATION THERAPY: No   PAST MEDICAL HISTORY:  Past Medical History:  Diagnosis Date   Diabetes mellitus (HCC)    Dyslipidemia    Hypertension        PAST SURGICAL HISTORY:History reviewed. No pertinent surgical history.   FAMILY HISTORY: History reviewed. No pertinent family history.   SOCIAL HISTORY:  reports that he has never smoked. He has never used smokeless tobacco. He reports current alcohol use of about 1.0 standard drink of alcohol per week. He reports that he does not use drugs. Seth Burch is married and lives in [place]. 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 He is accompanied by Seth Burch.    ALLERGIES: No known allergies   MEDICATIONS:  Current Outpatient Medications  Medication Sig Dispense Refill   ACCU-CHEK GUIDE test strip 1 each by Other route 2 (two) times daily.     amLODipine (NORVASC) 5 MG tablet Take 1 tablet by mouth daily.     Aspirin 81 MG CAPS Take 81 mg by mouth daily.     atorvastatin (LIPITOR) 20 MG tablet Take 1 tablet by mouth daily.     dexamethasone (DECADRON) 4 MG tablet Take 2 tablets daily for 2 days, start Seth day after chemotherapy. Take with food. 30 tablet 1  JARDIANCE 10 MG TABS tablet Take 1 tablet by mouth daily.     LANTUS SOLOSTAR 100 UNIT/ML Solostar Pen Inject 26 Units into Seth skin daily.     lidocaine-prilocaine (EMLA) cream Apply to affected area once 30 g 3   metFORMIN (GLUCOPHAGE) 500 MG tablet Take 2 tablets by mouth daily.     omeprazole (PRILOSEC) 40 MG capsule Take 40 mg by mouth daily.     ondansetron (ZOFRAN) 8 MG tablet Take 1 tablet (8 mg total) by mouth every 8 (eight)  hours as needed for nausea or vomiting. Start on Seth third day after chemotherapy. 30 tablet 1   prochlorperazine (COMPAZINE) 10 MG tablet Take 1 tablet (10 mg total) by mouth every 6 (six) hours as needed for nausea or vomiting. 30 tablet 1   ramipril (ALTACE) 10 MG capsule Take 2 capsules by mouth daily.     tamsulosin (FLOMAX) 0.4 MG CAPS capsule Take 1 capsule by mouth daily.     No current facility-administered medications for this encounter.     REVIEW OF SYSTEMS: On review of systems, Seth Burch reports that he is doing well and eating most foods without difficulties. Seth exception is with bread or starch containing foods. He has lost about 40 pounds unintentionally. He describes pain with belching or feelings of indigestion. No other complaints are verbalized.      PHYSICAL EXAM:  Wt Readings from Last 3 Encounters:  04/03/23 185 lb (83.9 kg)  03/27/23 185 lb 14.4 oz (84.3 kg)  04/09/21 219 lb 3.2 oz (99.4 kg)   Temp Readings from Last 3 Encounters:  04/03/23 97.8 F (36.6 C)  03/27/23 98.9 F (37.2 C) (Oral)  04/09/21 97.7 F (36.5 C) (Temporal)   BP Readings from Last 3 Encounters:  04/03/23 (!) 147/81  03/27/23 129/76  04/09/21 (!) 144/78   Pulse Readings from Last 3 Encounters:  04/03/23 79  03/27/23 74  04/09/21 70   Pain Assessment Pain Score: 0-No pain/10  In general this is a well appearing caucasian male in no acute distress. He's alert and oriented x4 and appropriate throughout Seth examination. Cardiopulmonary assessment is negative for acute distress and he exhibits normal effort.     ECOG = 1  0 - Asymptomatic (Fully active, able to carry on all predisease activities without restriction)  1 - Symptomatic but completely ambulatory (Restricted in physically strenuous activity but ambulatory and able to carry out work of a light or sedentary nature. For example, light housework, office work)  2 - Symptomatic, <50% in bed during Seth day (Ambulatory  and capable of all self care but unable to carry out any work activities. Up and about more than 50% of waking hours)  3 - Symptomatic, >50% in bed, but not bedbound (Capable of only limited self-care, confined to bed or chair 50% or more of waking hours)  4 - Bedbound (Completely disabled. Cannot carry on any self-care. Totally confined to bed or chair)  5 - Death   Santiago Glad MM, Creech RH, Tormey DC, et al. [phone]). "Toxicity and response criteria of [facility]". Am. Evlyn Clines. Oncol. 5 (6): 649-55    LABORATORY DATA:  Lab Results  Component Value Date   WBC 6.7 03/27/2023   HGB 13.5 03/27/2023   HCT 40.8 03/27/2023   MCV 88.7 03/27/2023   PLT 279 03/27/2023   Lab Results  Component Value Date   NA 140 03/27/2023   K 4.1 03/27/2023   CL 107 03/27/2023  CO2 27 03/27/2023   Lab Results  Component Value Date   ALT 13 03/27/2023   AST 16 03/27/2023   ALKPHOS 132 (H) 03/27/2023   BILITOT 0.5 03/27/2023      RADIOGRAPHY: No results found.     IMPRESSION/PLAN: 1. Locally Advanced Adenocarcinoma of Seth Distal esophagus overlapping into Seth gastric cardia. Dr. Mitzi Hansen discusses Seth pathology findings and reviews Seth nature of locally advanced cancer of Seth esophagus. Dr. Mitzi Hansen agrees with Seth recommendations given by Dr. Arlana Pouch for PET imaging to rule out metastatic disease as well as that this is used for treatment planning. He also agrees with EUS for more formal local staging and if a candidate, evaluation with Dr. Cliffton Asters to consider esophagectomy. Dr. Mitzi Hansen also agrees with neoadjuvant chemoradiation.  We discussed Seth risks, benefits, short, and long term effects of radiotherapy, as well as Seth curative intent, and Seth Burch is interested in proceeding. Dr. Mitzi Hansen discusses Seth delivery and logistics of radiotherapy and anticipates a course of 5 1/2 weeks of radiotherapy to Seth esophagus and regional nodes pending Seth work up. Written consent is obtained  and placed in Seth chart, a copy was provided to Seth Burch. Seth Burch will be contacted to coordinate treatment planning by our simulation department. We anticipate starting therapy on 04/21/23   In a visit lasting 60 minutes, greater than 50% of Seth time was spent face to face discussing Seth Burch's condition, in preparation for Seth discussion, and coordinating Seth Burch's care. I was located remotely from home via WebEx encounter.  Seth above documentation reflects my direct findings during this shared Burch visit. Please see Seth separate note by Dr. Mitzi Hansen on this date for Seth remainder of Seth Burch's plan of care.    Osker Mason, [facility]   **Disclaimer: This note was dictated with voice recognition software. Similar sounding words can inadvertently be transcribed and this note may contain transcription errors which may not have been corrected upon publication of note.**

## 2023-04-01 ENCOUNTER — Other Ambulatory Visit: Payer: Self-pay

## 2023-04-01 LAB — MULTIPLE MYELOMA PANEL, SERUM
Albumin SerPl Elph-Mcnc: 3.8 g/dL (ref 2.9–4.4)
Albumin/Glob SerPl: 1.1 (ref 0.7–1.7)
Alpha 1: 0.2 g/dL (ref 0.0–0.4)
Alpha2 Glob SerPl Elph-Mcnc: 1.1 g/dL — ABNORMAL HIGH (ref 0.4–1.0)
B-Globulin SerPl Elph-Mcnc: 1.2 g/dL (ref 0.7–1.3)
Gamma Glob SerPl Elph-Mcnc: 1 g/dL (ref 0.4–1.8)
Globulin, Total: 3.5 g/dL (ref 2.2–3.9)
IgA: 73 mg/dL (ref 61–437)
IgG (Immunoglobin G), Serum: 1120 mg/dL (ref 603–1613)
IgM (Immunoglobulin M), Srm: 29 mg/dL (ref 20–172)
M Protein SerPl Elph-Mcnc: 0.6 g/dL — ABNORMAL HIGH
Total Protein ELP: 7.3 g/dL (ref 6.0–8.5)

## 2023-04-02 ENCOUNTER — Other Ambulatory Visit: Payer: Self-pay

## 2023-04-02 ENCOUNTER — Other Ambulatory Visit: Payer: Self-pay | Admitting: Gastroenterology

## 2023-04-02 NOTE — Progress Notes (Signed)
GI Location of Tumor / Histology: Distal Esophagus  Seth Burch presented to his PCP with dysphagia and  roughly 20 pound weight loss since January/February 2024.   EUS 04/04/2023  PET 04/07/2023     Past/Anticipated interventions by surgeon, if any:    Past/Anticipated interventions by medical oncology, if any:  Dr. Arlana Pouch 03/27/2023 -Plan to start chemotherapy with carboplatin and paclitaxel, once weekly, in conjunction with radiation therapy.     Weight changes, if any: He reports about 5 pounds weight loss over the past month.  Bowel/Bladder complaints, if any: Denies  Nausea / Vomiting, if any: Denies nausea or vomiting.  He reports hiccups when stuff get stuck.  Pain issues, if any: None  Appetite: Good.  He is eating less amounts at a time than his usual.  Swallowing:  He reports it fluctuates depending on what he is eating.  He reports having trouble with starchy foods (breads, mashed potatoes)  SAFETY ISSUES: Prior radiation? No Pacemaker/ICD? No Possible current pregnancy? N/a Is the patient on methotrexate?   Current Complaints/Details:

## 2023-04-03 ENCOUNTER — Inpatient Hospital Stay: Payer: 59

## 2023-04-03 ENCOUNTER — Encounter (HOSPITAL_COMMUNITY): Payer: Self-pay | Admitting: Gastroenterology

## 2023-04-03 ENCOUNTER — Other Ambulatory Visit: Payer: Self-pay

## 2023-04-03 ENCOUNTER — Encounter: Payer: Self-pay | Admitting: Radiation Oncology

## 2023-04-03 ENCOUNTER — Ambulatory Visit
Admission: RE | Admit: 2023-04-03 | Discharge: 2023-04-03 | Disposition: A | Discharge status: Home / Self Care | Payer: 59 | Source: Ambulatory Visit | Attending: Radiation Oncology | Admitting: Radiation Oncology

## 2023-04-03 ENCOUNTER — Other Ambulatory Visit: Payer: Self-pay | Admitting: Oncology

## 2023-04-03 VITALS — BP 147/81 | HR 79 | Temp 97.8°F | Resp 20 | Ht 70.0 in | Wt 185.0 lb

## 2023-04-03 DIAGNOSIS — Z79899 Other long term (current) drug therapy: Secondary | ICD-10-CM | POA: Diagnosis not present

## 2023-04-03 DIAGNOSIS — E119 Type 2 diabetes mellitus without complications: Secondary | ICD-10-CM | POA: Insufficient documentation

## 2023-04-03 DIAGNOSIS — C155 Malignant neoplasm of lower third of esophagus: Secondary | ICD-10-CM | POA: Insufficient documentation

## 2023-04-03 DIAGNOSIS — E785 Hyperlipidemia, unspecified: Secondary | ICD-10-CM | POA: Insufficient documentation

## 2023-04-03 DIAGNOSIS — Z7984 Long term (current) use of oral hypoglycemic drugs: Secondary | ICD-10-CM | POA: Insufficient documentation

## 2023-04-03 DIAGNOSIS — Z7982 Long term (current) use of aspirin: Secondary | ICD-10-CM | POA: Insufficient documentation

## 2023-04-03 DIAGNOSIS — I10 Essential (primary) hypertension: Secondary | ICD-10-CM | POA: Insufficient documentation

## 2023-04-04 ENCOUNTER — Telehealth: Payer: Self-pay

## 2023-04-04 ENCOUNTER — Ambulatory Visit (HOSPITAL_COMMUNITY)
Admission: RE | Admit: 2023-04-04 | Discharge: 2023-04-04 | Disposition: A | Discharge status: Home / Self Care | Payer: 59 | Attending: Gastroenterology | Admitting: Gastroenterology

## 2023-04-04 ENCOUNTER — Ambulatory Visit (HOSPITAL_COMMUNITY): Payer: 59 | Admitting: Anesthesiology

## 2023-04-04 ENCOUNTER — Other Ambulatory Visit: Payer: Self-pay

## 2023-04-04 ENCOUNTER — Encounter (HOSPITAL_COMMUNITY): Payer: Self-pay | Admitting: Gastroenterology

## 2023-04-04 ENCOUNTER — Encounter (HOSPITAL_COMMUNITY)
Admission: RE | Disposition: A | Discharge status: Home / Self Care | Payer: Self-pay | Source: Home / Self Care | Attending: Gastroenterology

## 2023-04-04 DIAGNOSIS — E119 Type 2 diabetes mellitus without complications: Secondary | ICD-10-CM | POA: Insufficient documentation

## 2023-04-04 DIAGNOSIS — K2281 Esophageal polyp: Secondary | ICD-10-CM | POA: Diagnosis present

## 2023-04-04 DIAGNOSIS — I10 Essential (primary) hypertension: Secondary | ICD-10-CM | POA: Diagnosis not present

## 2023-04-04 DIAGNOSIS — C155 Malignant neoplasm of lower third of esophagus: Secondary | ICD-10-CM | POA: Insufficient documentation

## 2023-04-04 DIAGNOSIS — K2289 Other specified disease of esophagus: Secondary | ICD-10-CM | POA: Insufficient documentation

## 2023-04-04 HISTORY — PX: ESOPHAGOGASTRODUODENOSCOPY (EGD) WITH PROPOFOL: SHX5813

## 2023-04-04 HISTORY — PX: UPPER ESOPHAGEAL ENDOSCOPIC ULTRASOUND (EUS): SHX6562

## 2023-04-04 LAB — GLUCOSE, CAPILLARY: Glucose-Capillary: 186 mg/dL — ABNORMAL HIGH (ref 70–99)

## 2023-04-04 SURGERY — UPPER ESOPHAGEAL ENDOSCOPIC ULTRASOUND (EUS)
Anesthesia: Monitor Anesthesia Care

## 2023-04-04 MED ORDER — PROPOFOL 10 MG/ML IV BOLUS
INTRAVENOUS | Status: DC | PRN
  Administered 2023-04-04: 70 mg via INTRAVENOUS

## 2023-04-04 MED ORDER — PROPOFOL 500 MG/50ML IV EMUL
INTRAVENOUS | Status: DC | PRN
  Administered 2023-04-04: 140 ug/kg/min via INTRAVENOUS

## 2023-04-04 MED ORDER — SODIUM CHLORIDE 0.9 % IV SOLN: INTRAVENOUS | Status: DC | PRN

## 2023-04-04 MED ORDER — DEXMEDETOMIDINE HCL IN NACL 80 MCG/20ML IV SOLN
INTRAVENOUS | Status: DC | PRN
  Administered 2023-04-04: 8 ug via INTRAVENOUS

## 2023-04-04 MED ORDER — LIDOCAINE HCL (CARDIAC) PF 100 MG/5ML IV SOSY
PREFILLED_SYRINGE | INTRAVENOUS | Status: DC | PRN
  Administered 2023-04-04: 80 mg via INTRAVENOUS

## 2023-04-04 NOTE — Transfer of Care (Signed)
Immediate Anesthesia Transfer of Care Note  Patient: Seth Burch  Procedure(s) Performed: UPPER ESOPHAGEAL ENDOSCOPIC ULTRASOUND (EUS) ESOPHAGOGASTRODUODENOSCOPY (EGD) WITH PROPOFOL  Patient Location: PACU and Endoscopy Unit  Anesthesia Type:MAC  Level of Consciousness: drowsy  Airway & Oxygen Therapy: Patient Spontanous Breathing and Patient connected to face mask oxygen  Post-op Assessment: Report given to RN and Post -op Vital signs reviewed and stable  Post vital signs: Reviewed and stable  Last Vitals:  Vitals Value Taken Time  BP    Temp    Pulse 63 04/04/23 0856  Resp 21 04/04/23 0856  SpO2 98 % 04/04/23 0856  Vitals shown include unfiled device data.  Last Pain:  Vitals:   04/04/23 0736  TempSrc: Temporal  PainSc: 0-No pain         Complications: No notable events documented.

## 2023-04-04 NOTE — H&P (Signed)
Seth Burch HPI: This past January he had an episode of odynophagia after eating a corn chip.  He thought that he lacerated his esophagus and he had persistent pain at the thoracic inlet.  Over this past summer he started to experience solid dysphagia and his weight loss accelerated.  In total over this year he lost 40 lbs.  Over time his symptoms continued to progress.  He saw Dr. Braulio Conte in October and he was diagnosed with an esophageal cancer.  The patient was then referred to Oncology for further evaluation.  Past Medical History:  Diagnosis Date   Diabetes mellitus (HCC)    Dyslipidemia    Hypertension     History reviewed. No pertinent surgical history.  History reviewed. No pertinent family history.  Social History:  reports that he has never smoked. He has never used smokeless tobacco. He reports current alcohol use of about 1.0 standard drink of alcohol per week. He reports that he does not use drugs.  Allergies:  Allergies  Allergen Reactions   No Known Allergies     Other Reaction(s): Not available    Medications: Scheduled: Continuous:  No results found for this or any previous visit (from the past 24 hour(s)).   No results found.  ROS:  As stated above in the HPI otherwise negative.  There were no vitals taken for this visit.    PE: Gen: NAD, Alert and Oriented HEENT:  [place]/AT, EOMI Neck: Supple, no LAD Lungs: CTA Bilaterally CV: RRR without M/G/R ABD: Soft, NTND, +BS Ext: No C/C/E  Assessment/Plan: 1) Esopahgeal cancer - EUS with possible FNA.  Diontre Harps D 04/04/2023, 7:05 AM

## 2023-04-04 NOTE — Op Note (Addendum)
[facility] Patient Name: Seth Burch Procedure Date: 04/04/2023 MRN: [id number] Attending MD: Jeani Hawking , MD, [id number] Date of Birth: February 14, 1956 CSN: [id number] Age: 67 Admit Type: Outpatient Procedure:                Upper EUS Indications:              Esophageal mucosal mass/polyp found on endoscopy Providers:                Jeani Hawking, MD, Fransisca Connors, Suzy Bouchard,                            RN, Geoffery Lyons, Technician Referring MD:              Medicines:                Propofol per Anesthesia Complications:            No immediate complications. Estimated Blood Loss:     Estimated blood loss: none. Procedure:                Pre-Anesthesia Assessment:                           - Prior to the procedure, a History and Physical                            was performed, and patient medications and                            allergies were reviewed. The patient's tolerance of                            previous anesthesia was also reviewed. The risks                            and benefits of the procedure and the sedation                            options and risks were discussed with the patient.                            All questions were answered, and informed consent                            was obtained. Prior Anticoagulants: The patient has                            taken no anticoagulant or antiplatelet agents. ASA                            Grade Assessment: III - A patient with severe                            systemic disease. After reviewing the risks and  benefits, the patient was deemed in satisfactory                            condition to undergo the procedure.                           - Sedation was administered by an anesthesia                            professional. Deep sedation was attained.                           After obtaining informed consent, the endoscope was                             passed under direct vision. Throughout the                            procedure, the patient's blood pressure, pulse, and                            oxygen saturations were monitored continuously. The                            [id number]-[id number] ([id number]) Olympus endoscope was introduced                            through the mouth, and advanced to the second part                            of duodenum. The [id number] ([id number]) Olympus linear                            ultrasound scope was introduced through the mouth,                            and advanced to the second part of duodenum. The                            upper EUS was accomplished without difficulty. The                            patient tolerated the procedure well. Scope In: Scope Out: Findings:      ENDOSCOPIC FINDING: :      A large, fungating mass with no bleeding and no stigmata of recent       bleeding was found in the lower third of the esophagus, 33 cm from the       incisors. The mass was non-obstructing and circumferential.      ENDOSONOGRAPHIC FINDING: :      A hypoechoic mass was found in the lower third of the esophagus. The       mass was encountered at 33 cm from the incisors and extended to 39 cm.       The lesion was circumferential. The endosonographic borders were  irregular. 50 mm. There was sonographic evidence suggesting invasion       into the adventitia (Layer 5).      The esophageal cancer was noted to extend from 33-39 cm. In the proximal       portion it was 2/3rds circumfirential, but then it was circumfirential       distally. There was no obstruction with the cancer. The cancer was       friable. At its greatest depth it was measured to be approximately 50       mm, however, this was possibily a tagential view. The lesion was       certainly 30 mm. The cancer involved the adventitia, but there was no       invasion into the surrounding vascular structures. Two paraesophageal       LNs were  identified and they were pathologic appearing. In the subcarina       there was evidence of an 11 mm LN, but it appeared shody and it was not       amenable to FNA as it was a thin LN. In the viewed portions of the liver       the was no evidence of any mets. The celiac axis appeared normal and       there was no evidence of any LAD in that region. His pancreas exhibited       hyperechoic strainding and some lobularity.      The staging is a uT3N1/2Mx Impression:               - Malignant esophageal tumor was found in the lower                            third of the esophagus.                           - A mass was found in the lower third of the                            esophagus. Tissue has not been obtained. However,                            the endosonographic appearance is consistent with                            adenocarcinoma. This was staged uT3 N1/2 Mx by                            endosonographic criteria.                           - No specimens collected. Moderate Sedation:      Not Applicable - Patient had care per Anesthesia. Recommendation:           - Patient has a contact number available for                            emergencies. The signs and symptoms of potential  delayed complications were discussed with the                            patient. Return to normal activities tomorrow.                            Written discharge instructions were provided to the                            patient.                           - Resume regular diet.                           - Treatment per Oncology.                           ADDENDUM: This is a uT3 tumor and not a uT4. The                            correction was made in the body of the note. Procedure Code(s):        --- Professional ---                           [phone], Esophagogastroduodenoscopy, flexible,                            transoral; with endoscopic ultrasound examination                             limited to the esophagus, stomach or duodenum, and                            adjacent structures Diagnosis Code(s):        --- Professional ---                           C15.5, Malignant neoplasm of lower third of                            esophagus                           K22.89, Other specified disease of esophagus                           K22.81, Esophageal polyp CPT copyright 2022 American Medical Association. All rights reserved. The codes documented in this report are preliminary and upon coder review may  be revised to meet current compliance requirements. Jeani Hawking, MD Jeani Hawking, MD 04/04/2023 9:14:56 AM This report has been signed electronically. Number of Addenda: 0

## 2023-04-04 NOTE — Telephone Encounter (Signed)
Contacted [facility] to see if I could get color images of this patients endoscopy for Dr. Cliffton Asters so we can move forward with his treatment. Left my number for them to call me back.

## 2023-04-04 NOTE — Anesthesia Postprocedure Evaluation (Signed)
Anesthesia Post Note  Patient: Seth Burch  Procedure(s) Performed: UPPER ESOPHAGEAL ENDOSCOPIC ULTRASOUND (EUS) ESOPHAGOGASTRODUODENOSCOPY (EGD) WITH PROPOFOL     Patient location during evaluation: PACU Anesthesia Type: MAC Level of consciousness: awake and alert Pain management: pain level controlled Vital Signs Assessment: post-procedure vital signs reviewed and stable Respiratory status: spontaneous breathing, nonlabored ventilation and respiratory function stable Cardiovascular status: blood pressure returned to baseline and stable Postop Assessment: no apparent nausea or vomiting Anesthetic complications: no   No notable events documented.  Last Vitals:  Vitals:   04/04/23 0910 04/04/23 0920  BP: 122/66 126/71  Pulse: 61 (!) 59  Resp: (!) 22 18  Temp:    SpO2: 96% 98%    Last Pain:  Vitals:   04/04/23 0920  TempSrc:   PainSc: 0-No pain                 Lowella Curb

## 2023-04-04 NOTE — Anesthesia Preprocedure Evaluation (Signed)
Anesthesia Evaluation  Patient identified by MRN, date of birth, ID band Patient awake    Reviewed: Allergy & Precautions, H&P , NPO status , Patient's Chart, lab work & pertinent test results  Airway Mallampati: II  TM Distance: >3 FB Neck ROM: Full    Dental no notable dental hx.    Pulmonary neg pulmonary ROS   Pulmonary exam normal breath sounds clear to auscultation       Cardiovascular hypertension, Pt. on medications negative cardio ROS Normal cardiovascular exam Rhythm:Regular Rate:Normal     Neuro/Psych negative neurological ROS  negative psych ROS   GI/Hepatic negative GI ROS, Neg liver ROS,,,  Endo/Other  negative endocrine ROSdiabetes, Type 2    Renal/GU negative Renal ROS  negative genitourinary   Musculoskeletal negative musculoskeletal ROS (+)    Abdominal   Peds negative pediatric ROS (+)  Hematology negative hematology ROS (+)   Anesthesia Other Findings Esophageal Cancer  Reproductive/Obstetrics negative OB ROS                             Anesthesia Physical Anesthesia Plan  ASA: 3  Anesthesia Plan: MAC   Post-op Pain Management: Minimal or no pain anticipated   Induction: Intravenous  PONV Risk Score and Plan: 1 and Propofol infusion and Treatment may vary due to age or medical condition  Airway Management Planned: Nasal Cannula  Additional Equipment:   Intra-op Plan:   Post-operative Plan:   Informed Consent: I have reviewed the patients History and Physical, chart, labs and discussed the procedure including the risks, benefits and alternatives for the proposed anesthesia with the patient or authorized representative who has indicated his/her understanding and acceptance.     Dental advisory given  Plan Discussed with: CRNA  Anesthesia Plan Comments:        Anesthesia Quick Evaluation

## 2023-04-04 NOTE — Discharge Instructions (Signed)

## 2023-04-06 ENCOUNTER — Other Ambulatory Visit: Payer: Self-pay

## 2023-04-07 ENCOUNTER — Ambulatory Visit (HOSPITAL_COMMUNITY)
Admission: RE | Admit: 2023-04-07 | Discharge: 2023-04-07 | Disposition: A | Discharge status: Home / Self Care | Payer: 59 | Source: Ambulatory Visit | Attending: Oncology | Admitting: Oncology

## 2023-04-07 ENCOUNTER — Encounter (HOSPITAL_COMMUNITY): Payer: Self-pay | Admitting: Gastroenterology

## 2023-04-07 DIAGNOSIS — C155 Malignant neoplasm of lower third of esophagus: Secondary | ICD-10-CM | POA: Diagnosis present

## 2023-04-07 LAB — GLUCOSE, CAPILLARY: Glucose-Capillary: 134 mg/dL — ABNORMAL HIGH (ref 70–99)

## 2023-04-07 MED ORDER — FLUDEOXYGLUCOSE F - 18 (FDG) INJECTION
9.5000 | Freq: Once | INTRAVENOUS | Status: AC | PRN
  Administered 2023-04-07: 9.2 via INTRAVENOUS

## 2023-04-08 ENCOUNTER — Other Ambulatory Visit: Payer: Self-pay | Admitting: Urology

## 2023-04-08 ENCOUNTER — Ambulatory Visit
Admission: RE | Admit: 2023-04-08 | Discharge: 2023-04-08 | Disposition: A | Discharge status: Home / Self Care | Payer: 59 | Source: Ambulatory Visit | Attending: Radiation Oncology | Admitting: Radiation Oncology

## 2023-04-08 ENCOUNTER — Other Ambulatory Visit: Payer: Self-pay | Admitting: Oncology

## 2023-04-08 ENCOUNTER — Telehealth: Payer: Self-pay

## 2023-04-08 DIAGNOSIS — C155 Malignant neoplasm of lower third of esophagus: Secondary | ICD-10-CM | POA: Diagnosis present

## 2023-04-08 NOTE — H&P (Signed)
Chief Complaint: Patient was seen in consultation today for port-A-cath placement for management of Primary adenocarcinoma of distal third of esophagus, at the request of SethBurch  Supervising Physician: Seth Burch  Patient Status: [facility] - Out-pt  History of Present Illness: Seth Burch is a 67 y.o. male   [...] Code status per patient.  The patient initially presented to his primary care doctor after complaining of dysphagia and weight loss since January or February 2024 with approximately 20 pounds of unintended weight loss.  He was referred to GI.    He underwent endoscopy and colonoscopy in [place] with Dr. Jennye Burch on 03/11/2023.   On endoscopy he had a mass seated in the distal esophagus between 30 and 38 cm from the incisors that was necrotic and infiltrating and some oozing prior to the passing of the endoscope was noted, the remainder of the exam showed infiltrative mass extending into the gastric cardia and normal-appearing duodenum.  No abnormalities were seen on colonoscopy.  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    He has undergone additional imaging with CT of the chest abdomen and pelvis on 03/14/2023, notable circumferential masslike thickening of the lower third of the esophagus measuring 7 cm in length with an enlarged right paraesophageal lymph node measuring 1.2 cm.changes.    He met with Dr. Arlana Burch, and a PET/CT from 04/07/2023 showed known distal esophageal/GE junction adenocarcinoma and lower paraesophageal nodal metastasis.  No evidence of distant metastatic disease.    Patient is scheduled for EUS with Dr. Elnoria Burch 11/19, and will see Dr. Cliffton Burch 04/25/23 to consider esophagectomy after neoadjuvant treatment. Patient is scheduled to begin radiation treatments tentatively from 04/21/2023.  Plan for chemo initiation with  weekly carboplatin and paclitaxel around the same time.  Patient is referred to IR for port-a-cath placement.    Past Medical History:  Diagnosis Date   Diabetes mellitus (HCC)    Dyslipidemia    Hypertension     Past Surgical History:  Procedure Laterality Date   ESOPHAGOGASTRODUODENOSCOPY (EGD) WITH PROPOFOL N/A 04/04/2023   Procedure: ESOPHAGOGASTRODUODENOSCOPY (EGD) WITH PROPOFOL;  Surgeon: Seth Hawking, MD;  Location: [facility];  Service: Gastroenterology;  Laterality: N/A;   UPPER ESOPHAGEAL ENDOSCOPIC ULTRASOUND (EUS) N/A 04/04/2023   Procedure: UPPER ESOPHAGEAL ENDOSCOPIC ULTRASOUND (EUS);  Surgeon: Seth Hawking, MD;  Location: [facility];  Service: Gastroenterology;  Laterality: N/A;    Allergies: No known allergies  Medications: Prior to Admission medications   Medication Sig Start Date End Date Taking? Authorizing Provider  ACCU-CHEK GUIDE test strip 1 each by Other route 2 (two) times daily. 03/15/23   [provider]  amLODipine (NORVASC) 5 MG tablet Take 1 tablet by mouth daily.    [provider]  Aspirin 81 MG CAPS Take 81 mg by mouth daily.    [provider]  atorvastatin (LIPITOR) 20 MG tablet Take 1 tablet by mouth daily. 02/26/19   [provider]  dexamethasone (DECADRON) 4 MG tablet Take 2 tablets daily for 2 days, start the day after chemotherapy. Take with food. 03/27/23   [name], Seth Patten, MD  JARDIANCE 10 MG TABS tablet Take 1 tablet by mouth daily.    [provider]  LANTUS SOLOSTAR 100 UNIT/ML Solostar Pen Inject 26 Units into the skin daily. 02/19/23   [provider]  lidocaine-prilocaine (EMLA) cream Apply to affected area once 03/27/23   [name], Seth Patten, MD  metFORMIN (GLUCOPHAGE)  500 MG tablet Take 2 tablets by mouth daily. 02/26/19   [provider]  omeprazole (PRILOSEC) 40 MG capsule Take 40 mg by mouth daily. 03/13/23   [provider]  ondansetron (ZOFRAN) 8 MG tablet  Take 1 tablet (8 mg total) by mouth every 8 (eight) hours as needed for nausea or vomiting. Start on the third day after chemotherapy. 03/27/23   [name], Seth Patten, MD  prochlorperazine (COMPAZINE) 10 MG tablet Take 1 tablet (10 mg total) by mouth every 6 (six) hours as needed for nausea or vomiting. 03/27/23   [name], Seth Patten, MD  ramipril (ALTACE) 10 MG capsule Take 2 capsules by mouth daily. 03/04/19   [provider]  tamsulosin (FLOMAX) 0.4 MG CAPS capsule Take 1 capsule by mouth daily. 02/08/23   [provider]     No family history on file.  Social History   Socioeconomic History   Marital status: Married    Spouse name: Not on file   Number of children: Not on file   Years of education: Not on file   Highest education level: Not on file  Occupational History   Not on file  Tobacco Use   Smoking status: Never   Smokeless tobacco: Never  Substance and Sexual Activity   Alcohol use: Yes    Alcohol/week: 1.0 standard drink of alcohol    Types: 1 Cans of beer per week   Drug use: Never   Sexual activity: Not on file  Other Topics Concern   Not on file  Social History Narrative   Not on file   Social Determinants of Health   Financial Resource Strain: Not on file  Food Insecurity: No Food Insecurity (03/27/2023)   Hunger Vital Sign    Worried About Running Out of Food in the Last Year: Never true    Ran Out of Food in the Last Year: Never true  Transportation Needs: No Transportation Needs (03/27/2023)   PRAPARE - Administrator, Civil Service (Medical): No    Lack of Transportation (Non-Medical): No  Physical Activity: Not on file  Stress: Not on file  Social Connections: Not on file    Review of Systems: A 12 point ROS discussed and pertinent positives are indicated in the HPI above.  All other systems are negative.  Review of Systems  Vital Signs: There were no vitals taken for this visit.  Advance Care Plan: {Advance Care ZOXW:[id number]}     Physical Exam  Imaging: NM PET Image Initial (PI) Skull Base To Thigh  Result Date: 04/08/2023 CLINICAL DATA:  Initial treatment strategy for distal esophageal cancer. EXAM: NUCLEAR MEDICINE PET SKULL BASE TO THIGH TECHNIQUE: 9.2 mCi F-18 FDG was injected intravenously. Full-ring PET imaging was performed from the skull base to thigh after the radiotracer. CT data was obtained and used for attenuation correction and anatomic localization. Fasting blood glucose: 134 mg/dl COMPARISON:  CT chest abdomen pelvis dated 03/14/2023 FINDINGS: Mediastinal blood pool activity: SUV max 2.9 Liver activity: SUV max NA NECK: No hypermetabolic lymph nodes in the neck. Incidental CT findings: None. CHEST: Long segment masslike wall thickening extending from the distal esophagus to the gastric cardia, max SUV 26.2, suggesting distal esophageal/GE junction adenocarcinoma. Isolated 7 mm short axis lower paraesophageal node (series 4/image 87), max SUV 4.1, suspicious for nodal metastasis. No suspicious pulmonary nodules. Incidental CT findings: Mild atherosclerotic calcifications of the aortic arch. ABDOMEN/PELVIS: No abnormal hypermetabolic activity within the liver, pancreas, adrenal glands, or spleen. No hypermetabolic lymph  nodes in the abdomen or pelvis. Incidental CT findings: Left renal cysts, benign. Prostatomegaly, suggesting BPH. Small fat containing bilateral inguinal hernias. Mild atherosclerotic calcifications abdominal aorta. SKELETON: No focal hypermetabolic activity to suggest skeletal metastasis. Incidental CT findings: Mild degenerative changes of the visualized thoracolumbar spine. IMPRESSION: Distal esophageal/GE junction adenocarcinoma, as above. Lower paraesophageal nodal metastasis. No evidence of metastatic disease in the abdomen/pelvis. Electronically Signed   By: Charline Bills M.D.   On: 04/08/2023 00:13    Labs:  CBC: Recent Labs    03/27/23 1214  WBC 6.7  HGB 13.5  HCT 40.8  PLT  279    COAGS: No results for input(s): "INR", "APTT" in the last 8760 hours.  BMP: Recent Labs    03/27/23 1214  NA 140  K 4.1  CL 107  CO2 27  GLUCOSE 127*  BUN 20  CALCIUM 9.4  CREATININE 1.41*  GFRNONAA 55*    LIVER FUNCTION TESTS: Recent Labs    03/27/23 1214  BILITOT 0.5  AST 16  ALT 13  ALKPHOS 132*  PROT 8.0  ALBUMIN 4.4    TUMOR MARKERS: No results for input(s): "AFPTM", "CEA", "CA199", "CHROMGRNA" in the last 8760 hours.  Assessment and Plan:  PET/CT from 04/07/2023 showed known distal esophageal/GE junction adenocarcinoma and lower paraesophageal nodal metastasis.  No evidence of distant metastatic disease.    Patient is scheduled to begin radiation treatments tentatively from 04/21/2023.  Plan for chemo initiation with weekly carboplatin and paclitaxel around the same time.  Patient presents in IR for scheduled Port-A-cath placement.  Risks and benefits of image guided port-a-catheter placement was discussed with the patient including, but not limited to bleeding, infection, pneumothorax, or fibrin sheath development and need for additional procedures.  All of the patient's questions were answered, patient is agreeable to proceed. Consent signed and in chart.   Thank you for this interesting consult.  I greatly enjoyed meeting KIRIN BUELTEL and look forward to participating in their care.  A copy of this report was sent to the requesting provider on this date.  Electronically Signed: Sable Feil, PA-C 04/08/2023, 10:45 AM   I spent a total of  30 Minutes   in face to face in clinical consultation, greater than 50% of which was counseling/coordinating care for Port-A-Cath placement.

## 2023-04-08 NOTE — Telephone Encounter (Signed)
[facility] Clinical Social Work  Clinical Social Work was referred by new patient protocol for assessment of psychosocial needs.  Clinical Social Worker attempted to contact patient by phone to offer support and assess for needs.  CSW left vm stating purpose of call and direct information if needed. No follow up scheduled at this time.   Marguerita Merles, LCSW  Clinical Social Worker [facility]

## 2023-04-08 NOTE — Progress Notes (Signed)
Patient is scheduled to begin radiation treatments tentatively from 04/21/2023.  We will plan for chemo initiation with weekly carboplatin and paclitaxel around the same time.  Our clinic will arrange appointments.  PET/CT from 04/07/2023 showed known distal esophageal/GE junction adenocarcinoma and lower paraesophageal nodal metastasis.  No evidence of distant metastatic disease.  I tried calling patient with results.  There was no answer.  I will try calling him again.

## 2023-04-09 ENCOUNTER — Ambulatory Visit (HOSPITAL_COMMUNITY)
Admission: RE | Admit: 2023-04-09 | Discharge: 2023-04-09 | Disposition: A | Discharge status: Home / Self Care | Payer: 59 | Source: Ambulatory Visit | Attending: Oncology | Admitting: Oncology

## 2023-04-09 ENCOUNTER — Other Ambulatory Visit: Payer: Self-pay

## 2023-04-09 ENCOUNTER — Encounter (HOSPITAL_COMMUNITY): Payer: Self-pay

## 2023-04-09 DIAGNOSIS — C155 Malignant neoplasm of lower third of esophagus: Secondary | ICD-10-CM | POA: Insufficient documentation

## 2023-04-09 DIAGNOSIS — R634 Abnormal weight loss: Secondary | ICD-10-CM | POA: Insufficient documentation

## 2023-04-09 HISTORY — PX: IR IMAGING GUIDED PORT INSERTION: IMG5740

## 2023-04-09 LAB — GLUCOSE, CAPILLARY: Glucose-Capillary: 204 mg/dL — ABNORMAL HIGH (ref 70–99)

## 2023-04-09 MED ORDER — MIDAZOLAM HCL 2 MG/2ML IJ SOLN
INTRAMUSCULAR | Status: AC | PRN
  Administered 2023-04-09: 1 mg via INTRAVENOUS

## 2023-04-09 MED ORDER — HEPARIN SOD (PORK) LOCK FLUSH 100 UNIT/ML IV SOLN
INTRAVENOUS | Status: AC
  Filled 2023-04-09: qty 5

## 2023-04-09 MED ORDER — MIDAZOLAM HCL 2 MG/2ML IJ SOLN
INTRAMUSCULAR | Status: AC
  Filled 2023-04-09: qty 4

## 2023-04-09 MED ORDER — FENTANYL CITRATE (PF) 100 MCG/2ML IJ SOLN
INTRAMUSCULAR | Status: AC
  Filled 2023-04-09: qty 2

## 2023-04-09 MED ORDER — SODIUM CHLORIDE 0.9% FLUSH
10.0000 mL | Freq: Two times a day (BID) | INTRAVENOUS | Status: DC
  Administered 2023-04-09: 10 mL via INTRAVENOUS

## 2023-04-09 MED ORDER — FENTANYL CITRATE (PF) 100 MCG/2ML IJ SOLN
INTRAMUSCULAR | Status: AC | PRN
  Administered 2023-04-09: 50 ug via INTRAVENOUS

## 2023-04-09 MED ORDER — HEPARIN SOD (PORK) LOCK FLUSH 100 UNIT/ML IV SOLN
500.0000 [IU] | Freq: Once | INTRAVENOUS | Status: AC
  Administered 2023-04-09: 500 [IU] via INTRAVENOUS

## 2023-04-09 MED ORDER — LIDOCAINE-EPINEPHRINE 1 %-1:100000 IJ SOLN
INTRAMUSCULAR | Status: AC
  Filled 2023-04-09: qty 1

## 2023-04-09 MED ORDER — LIDOCAINE-EPINEPHRINE 1 %-1:100000 IJ SOLN
20.0000 mL | Freq: Once | INTRAMUSCULAR | Status: AC
  Administered 2023-04-09: 12 mL via INTRADERMAL

## 2023-04-09 NOTE — Procedures (Signed)
Interventional Radiology Procedure Note  Procedure: Placement of a right internal jugular approach single lumen power-injectable [employer].  Catheter tip at the cavoatrial jxn and ready for use.  Complications: None  Estimated Blood Loss: None  Recommendations: - Routine wound care   Signed,  Sterling Big, MD

## 2023-04-09 NOTE — Discharge Instructions (Addendum)

## 2023-04-10 ENCOUNTER — Other Ambulatory Visit: Payer: Self-pay | Admitting: Oncology

## 2023-04-10 ENCOUNTER — Telehealth: Payer: Self-pay | Admitting: Oncology

## 2023-04-10 NOTE — Telephone Encounter (Signed)
Scheduled appointments per WQ. Talked with the patients wife and she is aware of the made appointments.

## 2023-04-11 ENCOUNTER — Other Ambulatory Visit: Payer: Self-pay

## 2023-04-12 ENCOUNTER — Other Ambulatory Visit: Payer: Self-pay

## 2023-04-15 ENCOUNTER — Telehealth: Payer: Self-pay

## 2023-04-15 DIAGNOSIS — C155 Malignant neoplasm of lower third of esophagus: Secondary | ICD-10-CM | POA: Diagnosis present

## 2023-04-15 NOTE — Telephone Encounter (Signed)
Notified patient's spouse that his [employer] FMLA had been completed and faxed back to the company 11/25. Fax confirmation received.  Copy of documents placed up front for pick up at next visit. No further concerns at this time.

## 2023-04-21 ENCOUNTER — Other Ambulatory Visit: Payer: Self-pay

## 2023-04-21 ENCOUNTER — Ambulatory Visit
Admission: RE | Admit: 2023-04-21 | Discharge: 2023-04-21 | Disposition: A | Discharge status: Home / Self Care | Payer: 59 | Source: Ambulatory Visit | Attending: Radiation Oncology | Admitting: Radiation Oncology

## 2023-04-21 DIAGNOSIS — D701 Agranulocytosis secondary to cancer chemotherapy: Secondary | ICD-10-CM | POA: Diagnosis not present

## 2023-04-21 DIAGNOSIS — I700 Atherosclerosis of aorta: Secondary | ICD-10-CM | POA: Diagnosis not present

## 2023-04-21 DIAGNOSIS — C9000 Multiple myeloma not having achieved remission: Secondary | ICD-10-CM | POA: Diagnosis not present

## 2023-04-21 DIAGNOSIS — Z79630 Long term (current) use of alkylating agent: Secondary | ICD-10-CM | POA: Diagnosis not present

## 2023-04-21 DIAGNOSIS — R634 Abnormal weight loss: Secondary | ICD-10-CM | POA: Diagnosis not present

## 2023-04-21 DIAGNOSIS — I129 Hypertensive chronic kidney disease with stage 1 through stage 4 chronic kidney disease, or unspecified chronic kidney disease: Secondary | ICD-10-CM | POA: Diagnosis not present

## 2023-04-21 DIAGNOSIS — C155 Malignant neoplasm of lower third of esophagus: Secondary | ICD-10-CM | POA: Insufficient documentation

## 2023-04-21 DIAGNOSIS — N281 Cyst of kidney, acquired: Secondary | ICD-10-CM | POA: Diagnosis not present

## 2023-04-21 DIAGNOSIS — Z79633 Long term (current) use of mitotic inhibitor: Secondary | ICD-10-CM | POA: Diagnosis not present

## 2023-04-21 DIAGNOSIS — K4020 Bilateral inguinal hernia, without obstruction or gangrene, not specified as recurrent: Secondary | ICD-10-CM | POA: Diagnosis not present

## 2023-04-21 DIAGNOSIS — Z5111 Encounter for antineoplastic chemotherapy: Secondary | ICD-10-CM | POA: Diagnosis present

## 2023-04-21 DIAGNOSIS — N183 Chronic kidney disease, stage 3 unspecified: Secondary | ICD-10-CM | POA: Diagnosis not present

## 2023-04-21 DIAGNOSIS — Z8501 Personal history of malignant neoplasm of esophagus: Secondary | ICD-10-CM | POA: Diagnosis not present

## 2023-04-21 DIAGNOSIS — E1122 Type 2 diabetes mellitus with diabetic chronic kidney disease: Secondary | ICD-10-CM | POA: Diagnosis not present

## 2023-04-21 DIAGNOSIS — T451X5A Adverse effect of antineoplastic and immunosuppressive drugs, initial encounter: Secondary | ICD-10-CM | POA: Diagnosis not present

## 2023-04-21 DIAGNOSIS — K219 Gastro-esophageal reflux disease without esophagitis: Secondary | ICD-10-CM | POA: Diagnosis not present

## 2023-04-21 DIAGNOSIS — N400 Enlarged prostate without lower urinary tract symptoms: Secondary | ICD-10-CM | POA: Diagnosis not present

## 2023-04-21 DIAGNOSIS — Z79899 Other long term (current) drug therapy: Secondary | ICD-10-CM | POA: Diagnosis not present

## 2023-04-21 LAB — RAD ONC ARIA SESSION SUMMARY
Course Elapsed Days: 0
Plan Fractions Treated to Date: 1
Plan Prescribed Dose Per Fraction: 1.8 Gy
Plan Total Fractions Prescribed: 25
Plan Total Prescribed Dose: 45 Gy
Reference Point Dosage Given to Date: 1.8 Gy
Reference Point Session Dosage Given: 1.8 Gy
Session Number: 1

## 2023-04-22 ENCOUNTER — Encounter: Payer: Self-pay | Admitting: Specialist

## 2023-04-22 ENCOUNTER — Other Ambulatory Visit: Payer: Self-pay

## 2023-04-22 ENCOUNTER — Ambulatory Visit
Admission: RE | Admit: 2023-04-22 | Discharge: 2023-04-22 | Disposition: A | Discharge status: Home / Self Care | Payer: 59 | Source: Ambulatory Visit | Attending: Radiation Oncology | Admitting: Radiation Oncology

## 2023-04-22 DIAGNOSIS — Z5111 Encounter for antineoplastic chemotherapy: Secondary | ICD-10-CM | POA: Diagnosis not present

## 2023-04-22 LAB — RAD ONC ARIA SESSION SUMMARY
Course Elapsed Days: 1
Plan Fractions Treated to Date: 2
Plan Prescribed Dose Per Fraction: 1.8 Gy
Plan Total Fractions Prescribed: 25
Plan Total Prescribed Dose: 45 Gy
Reference Point Dosage Given to Date: 3.6 Gy
Reference Point Session Dosage Given: 1.8 Gy
Session Number: 2

## 2023-04-23 ENCOUNTER — Inpatient Hospital Stay: Payer: 59 | Attending: Oncology

## 2023-04-23 ENCOUNTER — Ambulatory Visit
Admission: RE | Admit: 2023-04-23 | Discharge: 2023-04-23 | Disposition: A | Discharge status: Home / Self Care | Payer: 59 | Source: Ambulatory Visit | Attending: Radiation Oncology | Admitting: Radiation Oncology

## 2023-04-23 ENCOUNTER — Other Ambulatory Visit: Payer: Self-pay

## 2023-04-23 DIAGNOSIS — I700 Atherosclerosis of aorta: Secondary | ICD-10-CM | POA: Insufficient documentation

## 2023-04-23 DIAGNOSIS — Z79633 Long term (current) use of mitotic inhibitor: Secondary | ICD-10-CM | POA: Insufficient documentation

## 2023-04-23 DIAGNOSIS — K4020 Bilateral inguinal hernia, without obstruction or gangrene, not specified as recurrent: Secondary | ICD-10-CM | POA: Insufficient documentation

## 2023-04-23 DIAGNOSIS — C9000 Multiple myeloma not having achieved remission: Secondary | ICD-10-CM | POA: Insufficient documentation

## 2023-04-23 DIAGNOSIS — N183 Chronic kidney disease, stage 3 unspecified: Secondary | ICD-10-CM | POA: Insufficient documentation

## 2023-04-23 DIAGNOSIS — C155 Malignant neoplasm of lower third of esophagus: Secondary | ICD-10-CM | POA: Insufficient documentation

## 2023-04-23 DIAGNOSIS — D701 Agranulocytosis secondary to cancer chemotherapy: Secondary | ICD-10-CM | POA: Insufficient documentation

## 2023-04-23 DIAGNOSIS — I129 Hypertensive chronic kidney disease with stage 1 through stage 4 chronic kidney disease, or unspecified chronic kidney disease: Secondary | ICD-10-CM | POA: Insufficient documentation

## 2023-04-23 DIAGNOSIS — N400 Enlarged prostate without lower urinary tract symptoms: Secondary | ICD-10-CM | POA: Insufficient documentation

## 2023-04-23 DIAGNOSIS — R634 Abnormal weight loss: Secondary | ICD-10-CM | POA: Insufficient documentation

## 2023-04-23 DIAGNOSIS — Z5111 Encounter for antineoplastic chemotherapy: Secondary | ICD-10-CM | POA: Diagnosis not present

## 2023-04-23 DIAGNOSIS — Z79630 Long term (current) use of alkylating agent: Secondary | ICD-10-CM | POA: Insufficient documentation

## 2023-04-23 DIAGNOSIS — Z8501 Personal history of malignant neoplasm of esophagus: Secondary | ICD-10-CM | POA: Insufficient documentation

## 2023-04-23 DIAGNOSIS — K219 Gastro-esophageal reflux disease without esophagitis: Secondary | ICD-10-CM | POA: Insufficient documentation

## 2023-04-23 DIAGNOSIS — Z79899 Other long term (current) drug therapy: Secondary | ICD-10-CM | POA: Insufficient documentation

## 2023-04-23 DIAGNOSIS — T451X5A Adverse effect of antineoplastic and immunosuppressive drugs, initial encounter: Secondary | ICD-10-CM | POA: Insufficient documentation

## 2023-04-23 DIAGNOSIS — N281 Cyst of kidney, acquired: Secondary | ICD-10-CM | POA: Insufficient documentation

## 2023-04-23 DIAGNOSIS — E1122 Type 2 diabetes mellitus with diabetic chronic kidney disease: Secondary | ICD-10-CM | POA: Insufficient documentation

## 2023-04-23 LAB — CBC WITH DIFFERENTIAL (CANCER CENTER ONLY)
Abs Immature Granulocytes: 0.01 10*3/uL (ref 0.00–0.07)
Basophils Absolute: 0.1 10*3/uL (ref 0.0–0.1)
Basophils Relative: 1 %
Eosinophils Absolute: 0.2 10*3/uL (ref 0.0–0.5)
Eosinophils Relative: 2 %
HCT: 40.2 % (ref 39.0–52.0)
Hemoglobin: 13.1 g/dL (ref 13.0–17.0)
Immature Granulocytes: 0 %
Lymphocytes Relative: 18 %
Lymphs Abs: 1.2 10*3/uL (ref 0.7–4.0)
MCH: 28.9 pg (ref 26.0–34.0)
MCHC: 32.6 g/dL (ref 30.0–36.0)
MCV: 88.5 fL (ref 80.0–100.0)
Monocytes Absolute: 0.7 10*3/uL (ref 0.1–1.0)
Monocytes Relative: 10 %
Neutro Abs: 4.8 10*3/uL (ref 1.7–7.7)
Neutrophils Relative %: 69 %
Platelet Count: 260 10*3/uL (ref 150–400)
RBC: 4.54 MIL/uL (ref 4.22–5.81)
RDW: 13.8 % (ref 11.5–15.5)
WBC Count: 6.9 10*3/uL (ref 4.0–10.5)
nRBC: 0 % (ref 0.0–0.2)

## 2023-04-23 LAB — CMP (CANCER CENTER ONLY)
ALT: 9 U/L (ref 0–44)
AST: 12 U/L — ABNORMAL LOW (ref 15–41)
Albumin: 4 g/dL (ref 3.5–5.0)
Alkaline Phosphatase: 112 U/L (ref 38–126)
Anion gap: 6 (ref 5–15)
BUN: 22 mg/dL (ref 8–23)
CO2: 28 mmol/L (ref 22–32)
Calcium: 9.2 mg/dL (ref 8.9–10.3)
Chloride: 106 mmol/L (ref 98–111)
Creatinine: 1.26 mg/dL — ABNORMAL HIGH (ref 0.61–1.24)
GFR, Estimated: >60 mL/min (ref >60)
Glucose, Bld: 158 mg/dL — ABNORMAL HIGH (ref 70–99)
Potassium: 4 mmol/L (ref 3.5–5.1)
Sodium: 140 mmol/L (ref 135–145)
Total Bilirubin: 0.4 mg/dL (ref <1.2)
Total Protein: 7.2 g/dL (ref 6.5–8.1)

## 2023-04-23 LAB — RAD ONC ARIA SESSION SUMMARY
Course Elapsed Days: 2
Plan Fractions Treated to Date: 3
Plan Prescribed Dose Per Fraction: 1.8 Gy
Plan Total Fractions Prescribed: 25
Plan Total Prescribed Dose: 45 Gy
Reference Point Dosage Given to Date: 5.4 Gy
Reference Point Session Dosage Given: 1.8 Gy
Session Number: 3

## 2023-04-24 ENCOUNTER — Telehealth: Payer: Self-pay | Admitting: *Deleted

## 2023-04-24 ENCOUNTER — Inpatient Hospital Stay: Payer: 59 | Admitting: Oncology

## 2023-04-24 ENCOUNTER — Ambulatory Visit
Admission: RE | Admit: 2023-04-24 | Discharge: 2023-04-24 | Disposition: A | Discharge status: Home / Self Care | Payer: 59 | Source: Ambulatory Visit | Attending: Radiation Oncology | Admitting: Radiation Oncology

## 2023-04-24 ENCOUNTER — Encounter: Payer: Self-pay | Admitting: Oncology

## 2023-04-24 ENCOUNTER — Other Ambulatory Visit: Payer: Self-pay

## 2023-04-24 VITALS — BP 139/80 | HR 72 | Temp 97.8°F | Resp 18 | Wt 179.7 lb

## 2023-04-24 DIAGNOSIS — Z5111 Encounter for antineoplastic chemotherapy: Secondary | ICD-10-CM | POA: Diagnosis not present

## 2023-04-24 DIAGNOSIS — N1831 Chronic kidney disease, stage 3a: Secondary | ICD-10-CM | POA: Diagnosis not present

## 2023-04-24 DIAGNOSIS — C155 Malignant neoplasm of lower third of esophagus: Secondary | ICD-10-CM | POA: Diagnosis not present

## 2023-04-24 DIAGNOSIS — D472 Monoclonal gammopathy: Secondary | ICD-10-CM | POA: Diagnosis not present

## 2023-04-24 DIAGNOSIS — N183 Chronic kidney disease, stage 3 unspecified: Secondary | ICD-10-CM | POA: Insufficient documentation

## 2023-04-24 LAB — RAD ONC ARIA SESSION SUMMARY
Course Elapsed Days: 3
Plan Fractions Treated to Date: 4
Plan Prescribed Dose Per Fraction: 1.8 Gy
Plan Total Fractions Prescribed: 25
Plan Total Prescribed Dose: 45 Gy
Reference Point Dosage Given to Date: 7.2 Gy
Reference Point Session Dosage Given: 1.8 Gy
Session Number: 4

## 2023-04-24 NOTE — Assessment & Plan Note (Addendum)
-  He has been on surveillance for IgG kappa MGUS. Seth Burch  Repeat myeloma labs from 03/27/2023 showed M protein of 0.6 g/dL, stable.  IgG kappa type on IFE.  Kappa/lambda light chain ratio stable at 5.24.  Overall picture consistent with MGUS.  Will continue surveillance alone.

## 2023-04-24 NOTE — Assessment & Plan Note (Signed)
History of kidney issues related to diabetes, creatinine improved to 1.26. Patient seeing a kidney specialist. -Monitor kidney function during chemotherapy, ensure patient stays hydrated.  He was advised to drink at least 80 ounces of water a day.

## 2023-04-24 NOTE — Telephone Encounter (Signed)
[employer] Disability claim no#: [id number] received on 04-23-2023 without signed ROI or [facility] cover sheet.  Connected with Placido Sou in radiation waiting area and obtained both. Today completed form to providers collaborative pick up bin for review, amendment, signature an return to form staff to complete process.

## 2023-04-24 NOTE — Progress Notes (Signed)
[facility]  ONCOLOGY CLINIC PROGRESS NOTE   Patient Care Team: Shelle Iron, MD as PCP - General (Family Medicine) Misenheimer, Marcial Pacas, MD as Consulting Physician (Gastroenterology) Meryl Crutch, MD as Consulting Physician (Hematology and Oncology)  Date of visit: 04/24/2023   ASSESSMENT & PLAN:   67 y.o.  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  Concern for lymph node involvement based on CT scan.  Significant weight loss (40-50 lbs) over the past 9-10 months.   - Previously I discussed tentative staging, prognosis, plan of care, treatment options with the patient and his wife.  Reviewed NCCN guidelines.  Explained to them that treatment of esophageal cancer involves multidisciplinary approach including cardiothoracic surgery, medical oncology, radiation oncology, gastroenterology.    -I discussed his case with Dr. Jennye Boroughs previously.  Since they do not perform EUS, referral sent to local GI Dr. Jeani Hawking for EUS to help with staging.  On 04/04/2023, Dr. Elnoria Howard performed upper EUS.  It was at least T3-T4, N 1/2, MX by endosonographic criteria.   -Staging PET scan on 04/07/2023 showed distal esophageal/GE junction adenocarcinoma.  Lower paraesophageal nodal metastasis.  No evidence of distant metastatic disease.  -Referral sent to cardiothoracic surgeon Dr. Cliffton Asters and to Dr. Mitzi Hansen, radiation oncologist for concurrent chemoradiation planning in the neoadjuvant setting.  -Discussed different treatment options.  Discussed risks versus benefits with each modality/regimen. Plan to start chemotherapy with carboplatin and paclitaxel, once weekly,  in conjunction with radiation therapy.  -He started radiation treatments under the direction of Dr. Mitzi Hansen from 04/21/2023.  He is scheduled to start cycle 1 of chemotherapy with carboplatin and paclitaxel tomorrow, 04/25/2023.  He is also meeting Dr. Cliffton Asters for surgical consultation tomorrow.  -Explained to him that he may need laparoscopic staging but we will defer this to Dr. Cliffton Asters.  -Order additional testing on biopsy specimen for potential targeted therapy options including MMR testing, PD L1 testing  -Plan for potential surgery following chemoradiation, pending further staging and response to treatment.  -He was advised to continue omeprazole daily.  MGUS (monoclonal gammopathy of unknown significance) -He has been on surveillance for IgG kappa MGUS. [name]  Repeat myeloma labs from 03/27/2023 showed M protein of 0.6 g/dL, stable.  IgG kappa type on IFE.  Kappa/lambda light chain ratio stable at 5.24.  Overall picture consistent with MGUS.  Will continue surveillance alone.  CKD (chronic kidney disease) stage 3, GFR 30-59 ml/min (HCC) History of kidney issues related to diabetes, creatinine improved to 1.26. Patient seeing a kidney specialist. -Monitor kidney function during chemotherapy, ensure patient stays hydrated.  He was advised to drink at least 80 ounces of water a day.   I will see him in 1 week for follow-up with repeat labs and for cycle 2 of chemotherapy.  He was advised to maintain adequate hydration to preserve his renal function.  I reviewed lab results and outside records for this visit and discussed relevant results with the patient. Diagnosis, plan of care and treatment options were also discussed in detail with the patient. Opportunity provided to ask questions and answers provided to his apparent satisfaction. Provided instructions to call our clinic with any problems, questions or concerns prior to return visit. I recommended to continue  follow-up with PCP and  sub-specialists. He verbalized understanding and agreed with the plan.   NCCN guidelines have been consulted in the planning of this patient's care.  I provided 40 minutes of face-to-face time during this encounter and > 50% was spent counseling as documented under my assessment and plan.    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  Radiation treatment started from 04/21/2023.  Chemotherapy started from 04/25/2023.  INTERVAL HISTORY:    Discussed the use of AI scribe software for clinical note transcription with the patient, who gave verbal consent to proceed.   [facility] is here today for repeat clinical assessment.   The patient's kidney function has been a concern due to elevated creatinine levels in the past, but recent tests show improvement. The patient has been under the care of a kidney specialist for several years due to diabetes-related kidney issues.  The patient has been eating well and has not experienced any difficulty swallowing, nausea, vomiting, or changes in stool. The patient has not reported any fevers, chills, or night sweats.   The patient has opted against a feeding tube at this time due to improved eating habits.  I have reviewed the past medical history, past surgical history, social history and family history with the patient and they are unchanged from previous note.  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 Jennye Boroughs after he presented to his PCP with complaints of dysphagia and weight loss.  He started noticing difficulty in swallowing in January or February 2024.  He also reported unintentional weight loss of about 20 pounds in 1 year.  Previously he used to take ranitidine for intermittent acid reflux but it was stopped approximately in 2018.  He was started on omeprazole and an EGD was planned for further evaluation.  Also screening colonoscopy was recommended since last colonoscopy was in 2012.   03/11/2023 Pathology Results   Biopsy of stomach/gastric cardia: Cardiac mucosa showing at least intramucosal adenocarcinoma.  Biopsy of esophageal mass: Invasive, moderately differentiated adenocarcinoma.   03/11/2023 Procedure   EGD with biopsy by Dr.Misenheimer: Large mass in the distal esophagus between 30 and 38 cm.  Probable infiltration of the mass in the gastric cardia.  Colonoscopy on 03/11/2023: Mild diverticular disease in the left colon.   03/14/2023 Imaging   CT chest, abdomen and pelvis: Circumferential, masslike thickening in the lower third of esophagus, 7 cm in length, consistent with known primary esophageal malignancy.  Enlarged right paraesophageal lymph node measuring 1.2 x 0.8 cm, consistent with nodal metastatic disease.  No other evidence of lymphadenopathy or metastatic disease in the chest, abdomen or pelvis.  Background of very fine centrilobular pulmonary nodules, most concentrated in the lung apices as well as mild diffuse bronchial wall thickening, most commonly seen in smoking-related respiratory bronchiolitis.  Prostatomegaly.   04/04/2023 Procedure   EUS by Dr.Hung: cT3 or cT4, N1/N2,Mx disease.   04/07/2023 PET scan  PET scan: distal esophageal/GE junction adenocarcinoma.  Lower paraesophageal nodal metastasis.  No evidence of distant metastatic disease.   04/24/2023 Cancer Staging   Staging form:  Esophagus - Adenocarcinoma, AJCC 8th Edition - Clinical: Stage III (cT3, cN1, cM0, G2) - Signed by Meryl Crutch, MD on 04/24/2023 Histologic grading system: 3 grade system   04/25/2023 -  Chemotherapy   Patient is on Treatment Plan : ESOPHAGUS Carboplatin + Paclitaxel Weekly X 6 Weeks with XRT         REVIEW OF SYSTEMS:   Review of Systems - Oncology  All other pertinent systems were reviewed with the patient and are negative.  ALLERGIES: He is allergic to no known allergies.  MEDICATIONS:  Current Outpatient Medications  Medication Sig Dispense Refill   ACCU-CHEK GUIDE test strip 1 each by Other route 2 (two) times daily.     amLODipine (NORVASC) 5 MG tablet Take 1 tablet by mouth daily.     Aspirin 81 MG CAPS Take 81 mg by mouth daily.     atorvastatin (LIPITOR) 20 MG tablet Take 1 tablet by mouth daily.     dexamethasone (DECADRON) 4 MG tablet Take 2 tablets daily for 2 days, start the day after chemotherapy. Take with food. 30 tablet 1   JARDIANCE 10 MG TABS tablet Take 1 tablet by mouth daily.     LANTUS SOLOSTAR 100 UNIT/ML Solostar Pen Inject 26 Units into the skin daily.     lidocaine-prilocaine (EMLA) cream Apply to affected area once 30 g 3   metFORMIN (GLUCOPHAGE) 500 MG tablet Take 2 tablets by mouth daily.     omeprazole (PRILOSEC) 40 MG capsule Take 40 mg by mouth daily.     ondansetron (ZOFRAN) 8 MG tablet Take 1 tablet (8 mg total) by mouth every 8 (eight) hours as needed for nausea or vomiting. Start on the third day after chemotherapy. 30 tablet 1   prochlorperazine (COMPAZINE) 10 MG tablet Take 1 tablet (10 mg total) by mouth every 6 (six) hours as needed for nausea or vomiting. 30 tablet 1   ramipril (ALTACE) 10 MG capsule Take 2 capsules by mouth daily.     tamsulosin (FLOMAX) 0.4 MG CAPS capsule Take 1 capsule by mouth daily.     No current facility-administered medications for this visit.     VITALS:   Blood pressure 139/80, pulse 72, temperature 97.8  F (36.6 C), temperature source Temporal, resp. rate 18, weight 179 lb 11.2 oz (81.5 kg), SpO2 100%.  Wt Readings from Last 3 Encounters:  04/24/23 179 lb 11.2 oz (81.5 kg)  04/09/23 185 lb (83.9 kg)  04/04/23 185 lb (83.9 kg)    Body mass index is 25.78 kg/m.  Performance status (ECOG): 1 - Symptomatic but completely ambulatory  PHYSICAL EXAM:   Physical Exam Constitutional:      General: He is not in acute distress.    Appearance: Normal appearance.  HENT:     Head: Normocephalic and atraumatic.  Eyes:     General: No scleral icterus.    Conjunctiva/sclera: Conjunctivae normal.  Cardiovascular:     Rate and Rhythm: Normal rate and regular rhythm.     Heart sounds: Normal heart sounds.  Pulmonary:     Effort: Pulmonary effort is normal.     Breath sounds: Normal breath sounds.  Chest:     Comments: Right-sided Port-A-Cath in place without any signs of infection Abdominal:     General: There is no distension.  Musculoskeletal:  Right lower leg: No edema.     Left lower leg: No edema.  Neurological:     General: No focal deficit present.     Mental Status: He is alert and oriented to person, place, and time.  Psychiatric:        Mood and Affect: Mood normal.        Behavior: Behavior normal.        Thought Content: Thought content normal.      LABORATORY DATA:   I have reviewed the data as listed.   Results for orders placed or performed in visit on 04/23/23 (from the past 72 hour(s))  CMP ([facility] only)     Status: Abnormal   Collection Time: 04/23/23  2:24 PM  Result Value Ref Range   Sodium 140 135 - 145 mmol/L   Potassium 4.0 3.5 - 5.1 mmol/L   Chloride 106 98 - 111 mmol/L   CO2 28 22 - 32 mmol/L   Glucose, Bld 158 (H) 70 - 99 mg/dL    Comment: Glucose reference range applies only to samples taken after fasting for at least 8 hours.   BUN 22 8 - 23 mg/dL   Creatinine 8.65 (H) 7.84 - 1.24 mg/dL   Calcium 9.2 8.9 - 69.6 mg/dL   Total Protein  7.2 6.5 - 8.1 g/dL   Albumin 4.0 3.5 - 5.0 g/dL   AST 12 (L) 15 - 41 U/L   ALT 9 0 - 44 U/L   Alkaline Phosphatase 112 38 - 126 U/L   Total Bilirubin 0.4 <1.2 mg/dL   GFR, Estimated >29 >52 mL/min    Comment: (NOTE) Calculated using the CKD-EPI Creatinine Equation (2021)    Anion gap 6 5 - 15    Comment: Performed at [facility], [address], [place], [place] [address]  CBC with Differential ([facility] Only)     Status: None   Collection Time: 04/23/23  2:24 PM  Result Value Ref Range   WBC Count 6.9 4.0 - 10.5 K/uL   RBC 4.54 4.22 - 5.81 MIL/uL   Hemoglobin 13.1 13.0 - 17.0 g/dL   HCT 44.0 10.2 - 72.5 %   MCV 88.5 80.0 - 100.0 fL   MCH 28.9 26.0 - 34.0 pg   MCHC 32.6 30.0 - 36.0 g/dL   RDW 36.6 44.0 - 34.7 %   Platelet Count 260 150 - 400 K/uL   nRBC 0.0 0.0 - 0.2 %   Neutrophils Relative % 69 %   Neutro Abs 4.8 1.7 - 7.7 K/uL   Lymphocytes Relative 18 %   Lymphs Abs 1.2 0.7 - 4.0 K/uL   Monocytes Relative 10 %   Monocytes Absolute 0.7 0.1 - 1.0 K/uL   Eosinophils Relative 2 %   Eosinophils Absolute 0.2 0.0 - 0.5 K/uL   Basophils Relative 1 %   Basophils Absolute 0.1 0.0 - 0.1 K/uL   Immature Granulocytes 0 %   Abs Immature Granulocytes 0.01 0.00 - 0.07 K/uL    Comment: Performed at [facility], [address] [place], [place] [address]     RADIOGRAPHIC STUDIES:  I have personally reviewed the radiological images as listed and agree with the findings in the report.  IR IMAGING GUIDED PORT INSERTION  Result Date: 04/09/2023 INDICATION: 67 year old male with a history of primary adenocarcinoma of the distal third of the esophagus. He presents for port catheter placement for chemotherapy. EXAM: IMPLANTED PORT A CATH PLACEMENT WITH  ULTRASOUND AND FLUOROSCOPIC GUIDANCE MEDICATIONS: None. ANESTHESIA/SEDATION: Versed 4 mg IV; Fentanyl 100 mcg IV administered by the radiology nurse under my supervision. Moderate  Sedation Time:  16 minutes The patient's vital signs and level of consciousness were continuously monitored during the procedure by the interventional radiology nurse under my direct supervision. FLUOROSCOPY: Radiation exposure index: 1 mGy reference air kerma COMPLICATIONS: None immediate. PROCEDURE: The right neck and chest was prepped with chlorhexidine, and draped in the usual sterile fashion using maximum barrier technique (cap and mask, sterile gown, sterile gloves, large sterile sheet, hand hygiene and cutaneous antiseptic). Local anesthesia was attained by infiltration with 1% lidocaine with epinephrine. Ultrasound demonstrated patency of the right internal jugular vein, and this was documented with an image. Under real-time ultrasound guidance, this vein was accessed with a 21 gauge micropuncture needle and image documentation was performed. A small dermatotomy was made at the access site with an 11 scalpel. A 0.018" wire was advanced into the SVC and the access needle exchanged for a 30F micropuncture vascular sheath. The 0.018" wire was then removed and a 0.035" wire advanced into the IVC. An appropriate location for the subcutaneous reservoir was selected below the clavicle and an incision was made through the skin and underlying soft tissues. The subcutaneous tissues were then dissected using a combination of blunt and sharp surgical technique and a pocket was formed. A single lumen power injectable portacatheter [facility]) was then tunneled through the subcutaneous tissues from the pocket to the dermatotomy and the port reservoir placed within the subcutaneous pocket. The venous access site was then serially dilated and a peel away vascular sheath placed over the wire. The wire was removed and the port catheter advanced into position under fluoroscopic guidance. The catheter tip is positioned in the superior cavoatrial junction. This was documented with a spot image. The portacatheter  was then tested and found to flush and aspirate well. The port was flushed with saline followed by 100 units/mL heparinized saline. The pocket was then closed in two layers using first subdermal inverted interrupted absorbable sutures followed by a running subcuticular suture. The epidermis was then sealed with Dermabond. The dermatotomy at the venous access site was also closed with Dermabond. IMPRESSION: Successful placement of a right IJ approach Smart Port with ultrasound and fluoroscopic guidance. The catheter is ready for use. Electronically Signed   By: Malachy Moan M.D.   On: 04/09/2023 12:47   NM PET Image Initial (PI) Skull Base To Thigh  Result Date: 04/08/2023 CLINICAL DATA:  Initial treatment strategy for distal esophageal cancer. EXAM: NUCLEAR MEDICINE PET SKULL BASE TO THIGH TECHNIQUE: 9.2 mCi F-18 FDG was injected intravenously. Full-ring PET imaging was performed from the skull base to thigh after the radiotracer. CT data was obtained and used for attenuation correction and anatomic localization. Fasting blood glucose: 134 mg/dl COMPARISON:  CT chest abdomen pelvis dated 03/14/2023 FINDINGS: Mediastinal blood pool activity: SUV max 2.9 Liver activity: SUV max NA NECK: No hypermetabolic lymph nodes in the neck. Incidental CT findings: None. CHEST: Long segment masslike wall thickening extending from the distal esophagus to the gastric cardia, max SUV 26.2, suggesting distal esophageal/GE junction adenocarcinoma. Isolated 7 mm short axis lower paraesophageal node (series 4/image 87), max SUV 4.1, suspicious for nodal metastasis. No suspicious pulmonary nodules. Incidental CT findings: Mild atherosclerotic calcifications of the aortic arch. ABDOMEN/PELVIS: No abnormal hypermetabolic activity within the liver, pancreas, adrenal glands, or spleen. No hypermetabolic lymph nodes in the abdomen or pelvis.  Incidental CT findings: Left renal cysts, benign. Prostatomegaly, suggesting BPH. Small fat  containing bilateral inguinal hernias. Mild atherosclerotic calcifications abdominal aorta. SKELETON: No focal hypermetabolic activity to suggest skeletal metastasis. Incidental CT findings: Mild degenerative changes of the visualized thoracolumbar spine. IMPRESSION: Distal esophageal/GE junction adenocarcinoma, as above. Lower paraesophageal nodal metastasis. No evidence of metastatic disease in the abdomen/pelvis. Electronically Signed   By: Charline Bills M.D.   On: 04/08/2023 00:13    CODE STATUS:   No orders of the defined types were placed in this encounter.    Future Appointments  Date Time Provider Department [facility]  04/25/2023  8:00 AM [facility] INFUSION [facility] None  04/25/2023  1:10 PM [name], MD [facility]  04/25/2023  3:15 PM [facility] [id number] [facility] None  04/25/2023  3:30 PM LINAC-MOODY [facility] None  04/28/2023  3:30 PM [facility] [id number] [facility] None  04/29/2023  3:45 PM [facility] [id number] [facility] None  04/30/2023  3:45 PM [facility] [id number] [facility] None  05/01/2023  3:45 PM [facility] [id number] [facility] None  05/02/2023 11:00 AM [facility] FLUSH [facility] None  05/02/2023 11:20 AM Janaria Mccammon, MD [facility] None  05/02/2023 12:00 PM [facility] INFUSION [facility] None  05/02/2023  3:45 PM [facility] [id number] [facility] None  05/05/2023  3:45 PM [facility] [id number] [facility] None  05/06/2023  3:45 PM [facility] [id number] [facility] None  05/07/2023  3:45 PM [facility] [id number] [facility] None  05/08/2023  3:45 PM [facility] [id number] [facility] None  05/09/2023 11:00 AM [facility] FLUSH [facility] None  05/09/2023 11:20 AM Chrystle Murillo, MD [facility] None  05/09/2023 12:00 PM [facility] INFUSION [facility] None  05/09/2023  3:55 PM [facility] [id number] [facility] None  05/12/2023  3:45 PM [facility] [id number] [facility] None  05/13/2023  1:00 PM [facility] [id number]  [facility] None  05/15/2023  3:45 PM [facility] [id number] [facility] None  05/16/2023 11:00 AM [facility] FLUSH [facility] None  05/16/2023 11:20 AM Atom Solivan, MD [facility] None  05/16/2023 12:00 PM [facility] INFUSION [facility] None  05/16/2023  3:45 PM [facility] [id number] [facility] None  05/19/2023  3:45 PM [facility] [id number] [facility] None  05/20/2023  1:30 PM [facility] [id number] [facility] None  05/22/2023 11:15 AM [facility] FLUSH [facility] None  05/22/2023 11:40 AM Korde Jeppsen, MD [facility] None  05/22/2023 12:30 PM [facility] INFUSION [facility] None  05/22/2023  4:15 PM [facility] [id number] [facility] None  05/23/2023  3:45 PM [facility] [id number] [facility] None  05/26/2023  3:45 PM [facility] [id number] [facility] None  05/27/2023  3:45 PM [facility] [id number] [facility] None  05/28/2023  3:45 PM [facility] [id number] [facility] None  05/29/2023  3:45 PM [facility] [id number] [facility] None  05/30/2023 11:15 AM [facility] FLUSH [facility] None  05/30/2023 11:40 AM Orvill Coulthard, MD [facility] None  05/30/2023 12:30 PM [facility] INFUSION [facility] None  05/30/2023  4:15 PM [facility] [id number] [facility] None      This document was completed utilizing speech recognition software. Grammatical errors, random word insertions, pronoun errors, and incomplete sentences are an occasional consequence of this system due to software limitations, ambient noise, and hardware issues. Any formal questions or concerns about the content, text or information contained within the body of this dictation should be directly addressed to the provider for clarification.

## 2023-04-24 NOTE — Assessment & Plan Note (Addendum)
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  Concern for lymph node involvement based on CT scan.  Significant weight loss (40-50 lbs) over the past 9-10 months.   - Previously I discussed tentative staging, prognosis, plan of care, treatment options with the patient and his wife.  Reviewed NCCN guidelines.  Explained to them that treatment of esophageal cancer involves multidisciplinary approach including cardiothoracic surgery, medical oncology, radiation oncology, gastroenterology.    -I discussed his case with Dr. Jennye Boroughs previously.  Since they do not perform EUS, referral sent to local GI Dr. Jeani Hawking for EUS to help with staging.  On 04/04/2023, Dr. Elnoria Howard performed upper EUS.  It was at least T3-T4, N 1/2, MX by endosonographic criteria.   -Staging PET scan on 04/07/2023 showed distal esophageal/GE junction adenocarcinoma.  Lower paraesophageal nodal metastasis.  No evidence of distant metastatic disease.  -Referral sent to cardiothoracic surgeon Dr. Cliffton Asters and to Dr. Mitzi Hansen, radiation oncologist for concurrent chemoradiation planning in the neoadjuvant setting.  -Discussed different treatment options.  Discussed risks versus benefits with each modality/regimen. Plan to start chemotherapy with carboplatin and paclitaxel, once weekly, in conjunction with radiation therapy.  -He started radiation treatments under the direction of Dr. Mitzi Hansen from 04/21/2023.  He is scheduled to start cycle 1 of chemotherapy with carboplatin and paclitaxel tomorrow, 04/25/2023.  He is also meeting Dr. Cliffton Asters for surgical consultation tomorrow.  -Explained to him that he may need laparoscopic staging but we will defer this to Dr. Cliffton Asters.  -Order additional testing on biopsy specimen for potential targeted therapy options including MMR testing, PD L1 testing  -Plan for potential surgery following chemoradiation, pending further staging and response to  treatment.  -He was advised to continue omeprazole daily.

## 2023-04-25 ENCOUNTER — Other Ambulatory Visit: Payer: Self-pay

## 2023-04-25 ENCOUNTER — Inpatient Hospital Stay: Payer: 59

## 2023-04-25 ENCOUNTER — Institutional Professional Consult (permissible substitution): Payer: 59 | Admitting: Thoracic Surgery (Cardiothoracic Vascular Surgery)

## 2023-04-25 ENCOUNTER — Encounter: Payer: Self-pay | Admitting: Thoracic Surgery (Cardiothoracic Vascular Surgery)

## 2023-04-25 ENCOUNTER — Ambulatory Visit
Admission: RE | Admit: 2023-04-25 | Discharge: 2023-04-25 | Disposition: A | Discharge status: Home / Self Care | Payer: 59 | Source: Ambulatory Visit | Attending: Radiation Oncology

## 2023-04-25 VITALS — BP 151/81 | HR 76 | Resp 20 | Ht 70.0 in | Wt 181.0 lb

## 2023-04-25 VITALS — BP 131/71 | HR 65 | Temp 98.4°F | Resp 18 | Wt 180.5 lb

## 2023-04-25 DIAGNOSIS — C155 Malignant neoplasm of lower third of esophagus: Secondary | ICD-10-CM | POA: Diagnosis not present

## 2023-04-25 DIAGNOSIS — Z5111 Encounter for antineoplastic chemotherapy: Secondary | ICD-10-CM | POA: Diagnosis not present

## 2023-04-25 LAB — RAD ONC ARIA SESSION SUMMARY
Course Elapsed Days: 4
Plan Fractions Treated to Date: 5
Plan Prescribed Dose Per Fraction: 1.8 Gy
Plan Total Fractions Prescribed: 25
Plan Total Prescribed Dose: 45 Gy
Reference Point Dosage Given to Date: 9 Gy
Reference Point Session Dosage Given: 1.8 Gy
Session Number: 5

## 2023-04-25 MED ORDER — DIPHENHYDRAMINE HCL 50 MG/ML IJ SOLN
50.0000 mg | Freq: Once | INTRAMUSCULAR | Status: AC
Start: 2023-04-25 — End: 2023-04-25
  Administered 2023-04-25: 50 mg via INTRAVENOUS
  Filled 2023-04-25: qty 1

## 2023-04-25 MED ORDER — SODIUM CHLORIDE 0.9 % IV SOLN: INTRAVENOUS | Status: DC

## 2023-04-25 MED ORDER — FAMOTIDINE IN NACL 20-0.9 MG/50ML-% IV SOLN
20.0000 mg | Freq: Once | INTRAVENOUS | Status: AC
  Administered 2023-04-25: 20 mg via INTRAVENOUS
  Filled 2023-04-25: qty 50

## 2023-04-25 MED ORDER — DEXAMETHASONE SODIUM PHOSPHATE 10 MG/ML IJ SOLN
10.0000 mg | Freq: Once | INTRAMUSCULAR | Status: AC
  Administered 2023-04-25: 10 mg via INTRAVENOUS
  Filled 2023-04-25: qty 1

## 2023-04-25 MED ORDER — CARBOPLATIN CHEMO INJECTION 450 MG/45ML
186.8000 mg | Freq: Once | INTRAVENOUS | Status: AC
  Administered 2023-04-25: 190 mg via INTRAVENOUS
  Filled 2023-04-25: qty 19

## 2023-04-25 MED ORDER — SODIUM CHLORIDE 0.9 % IV SOLN
50.0000 mg/m2 | Freq: Once | INTRAVENOUS | Status: AC
  Administered 2023-04-25: 102 mg via INTRAVENOUS
  Filled 2023-04-25: qty 17

## 2023-04-25 MED ORDER — PALONOSETRON HCL INJECTION 0.25 MG/5ML
0.2500 mg | Freq: Once | INTRAVENOUS | Status: AC
  Administered 2023-04-25: 0.25 mg via INTRAVENOUS
  Filled 2023-04-25: qty 5

## 2023-04-25 MED ORDER — SONAFINE EX EMUL
1.0000 | Freq: Once | CUTANEOUS | Status: AC
  Administered 2023-04-25: 1 via TOPICAL

## 2023-04-25 MED ORDER — SODIUM CHLORIDE 0.9% FLUSH
10.0000 mL | INTRAVENOUS | Status: DC | PRN
  Administered 2023-04-25: 10 mL

## 2023-04-25 MED ORDER — HEPARIN SOD (PORK) LOCK FLUSH 100 UNIT/ML IV SOLN
500.0000 [IU] | Freq: Once | INTRAVENOUS | Status: AC | PRN
  Administered 2023-04-25: 500 [IU]

## 2023-04-25 NOTE — Patient Instructions (Signed)
 [facility] - A DEPT OF [facility]  Discharge Instructions: Thank you for choosing [facility] to provide your oncology and hematology care.   If you have a lab appointment with the Cancer Center, please go directly to the Cancer Center and check in at the registration area.   Wear comfortable clothing and clothing appropriate for easy access to any Portacath or PICC line.   We strive to give you quality time with your provider. You may need to reschedule your appointment if you arrive late (15 or more minutes).  Arriving late affects you and other patients whose appointments are after yours.  Also, if you miss three or more appointments without notifying the office, you may be dismissed from the clinic at the provider's discretion.      For prescription refill requests, have your pharmacy contact our office and allow 72 hours for refills to be completed.    Today you received the following chemotherapy and/or immunotherapy agents Paclitaxel, Carboplatin   To help prevent nausea and vomiting after your treatment, we encourage you to take your nausea medication as directed.  BELOW ARE SYMPTOMS THAT SHOULD BE REPORTED IMMEDIATELY: *FEVER GREATER THAN 100.4 F (38 C) OR HIGHER *CHILLS OR SWEATING *NAUSEA AND VOMITING THAT IS NOT CONTROLLED WITH YOUR NAUSEA MEDICATION *UNUSUAL SHORTNESS OF BREATH *UNUSUAL BRUISING OR BLEEDING *URINARY PROBLEMS (pain or burning when urinating, or frequent urination) *BOWEL PROBLEMS (unusual diarrhea, constipation, pain near the anus) TENDERNESS IN MOUTH AND THROAT WITH OR WITHOUT PRESENCE OF ULCERS (sore throat, sores in mouth, or a toothache) UNUSUAL RASH, SWELLING OR PAIN  UNUSUAL VAGINAL DISCHARGE OR ITCHING   Items with * indicate a potential emergency and should be followed up as soon as possible or go to the Emergency Department if any problems should occur.  Please show the CHEMOTHERAPY ALERT CARD or  IMMUNOTHERAPY ALERT CARD at check-in to the Emergency Department and triage nurse.  Should you have questions after your visit or need to cancel or reschedule your appointment, please contact [facility] - A DEPT OF [facility]  Dept: [phone]  and follow the prompts.  Office hours are 8:00 a.m. to 4:30 p.m. Monday - Friday. Please note that voicemails left after 4:00 p.m. may not be returned until the following business day.  We are closed weekends and major holidays. You have access to a nurse at all times for urgent questions. Please call the main number to the clinic Dept: [phone] and follow the prompts.   For any non-urgent questions, you may also contact your provider using MyChart. We now offer e-Visits for anyone 1 and older to request care online for non-urgent symptoms. For details visit [email or web address].   Also download the MyChart app! Go to the app store, search "MyChart", open the app, select [facility], and log in with your MyChart username and password.

## 2023-04-25 NOTE — Progress Notes (Signed)

## 2023-04-26 ENCOUNTER — Other Ambulatory Visit: Payer: Self-pay

## 2023-04-28 ENCOUNTER — Other Ambulatory Visit: Payer: Self-pay

## 2023-04-28 ENCOUNTER — Telehealth: Payer: Self-pay | Admitting: *Deleted

## 2023-04-28 ENCOUNTER — Ambulatory Visit
Admission: RE | Admit: 2023-04-28 | Discharge: 2023-04-28 | Disposition: A | Discharge status: Home / Self Care | Payer: 59 | Source: Ambulatory Visit | Attending: Radiation Oncology | Admitting: Radiation Oncology

## 2023-04-28 DIAGNOSIS — Z5111 Encounter for antineoplastic chemotherapy: Secondary | ICD-10-CM | POA: Diagnosis not present

## 2023-04-28 LAB — RAD ONC ARIA SESSION SUMMARY
Course Elapsed Days: 7
Plan Fractions Treated to Date: 6
Plan Prescribed Dose Per Fraction: 1.8 Gy
Plan Total Fractions Prescribed: 25
Plan Total Prescribed Dose: 45 Gy
Reference Point Dosage Given to Date: 10.8 Gy
Reference Point Session Dosage Given: 1.8 Gy
Session Number: 6

## 2023-04-28 NOTE — Telephone Encounter (Signed)
Left message for pt to return call & discuss how he is doing with his recent treatment.  Encouraged to call Collaborative RN to discuss.

## 2023-04-28 NOTE — Telephone Encounter (Signed)
-----   Message from Nurse Lanora Manis T sent at 04/25/2023 11:45 AM EST ----- Regarding: Dr Arlana Pouch 1st time taxol/carbo Dr Campbell Riches pt received first time paclitaxel, carboplatin. Pt tolerated tx well without incident. Pt verbalized understanding of education materials and possible side effects. Pt due for callback.

## 2023-04-29 ENCOUNTER — Ambulatory Visit
Admission: RE | Admit: 2023-04-29 | Discharge: 2023-04-29 | Disposition: A | Discharge status: Home / Self Care | Payer: 59 | Source: Ambulatory Visit | Attending: Radiation Oncology | Admitting: Radiation Oncology

## 2023-04-29 ENCOUNTER — Other Ambulatory Visit: Payer: Self-pay

## 2023-04-29 DIAGNOSIS — Z5111 Encounter for antineoplastic chemotherapy: Secondary | ICD-10-CM | POA: Diagnosis not present

## 2023-04-29 LAB — RAD ONC ARIA SESSION SUMMARY
Course Elapsed Days: 8
Plan Fractions Treated to Date: 7
Plan Prescribed Dose Per Fraction: 1.8 Gy
Plan Total Fractions Prescribed: 25
Plan Total Prescribed Dose: 45 Gy
Reference Point Dosage Given to Date: 12.6 Gy
Reference Point Session Dosage Given: 1.8 Gy
Session Number: 7

## 2023-04-30 ENCOUNTER — Ambulatory Visit
Admission: RE | Admit: 2023-04-30 | Discharge: 2023-04-30 | Discharge status: Home / Self Care | Payer: 59 | Source: Ambulatory Visit | Attending: Radiation Oncology

## 2023-04-30 ENCOUNTER — Other Ambulatory Visit: Payer: Self-pay

## 2023-04-30 DIAGNOSIS — Z5111 Encounter for antineoplastic chemotherapy: Secondary | ICD-10-CM | POA: Diagnosis not present

## 2023-04-30 LAB — RAD ONC ARIA SESSION SUMMARY
Course Elapsed Days: 9
Plan Fractions Treated to Date: 8
Plan Prescribed Dose Per Fraction: 1.8 Gy
Plan Total Fractions Prescribed: 25
Plan Total Prescribed Dose: 45 Gy
Reference Point Dosage Given to Date: 14.4 Gy
Reference Point Session Dosage Given: 1.8 Gy
Session Number: 8

## 2023-04-30 NOTE — Telephone Encounter (Signed)
Received [facility] Disability signed by provider.  Successfully returned via fax [phone]) with medication list and pathology per [facility] Provider Statement.  Copy to [facility] bin for items to be scanned.  Form in envelope addressed to Placido Sou to [facility] file folder for pick up as requested completes process.  No further instructions received or actions performed by this nurse.

## 2023-04-30 NOTE — Progress Notes (Signed)
[address]       [place] [address]             [phone]                    JURIEL STAHR [facility] Medical Record #[id number] Date of Birth: March 29, 1956  Referring: Meryl Crutch, MD Primary Care: Shelle Iron, MD Primary Cardiologist: None  Chief Complaint:    Chief Complaint  Patient presents with   Esophageal Cancer    Chest CT 11/11, PET 11/18, EUS 11/15    History of Present Illness:    Seth Burch 67 y.o. 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  He has had a 30lb weight loss over the last several months with progressive dysphagia.  Since have his EUS, he states that he has been able to eat much better.     Zubrod Score: At the time of surgery this patient's most appropriate activity status/level should be described as: [x]     0    Normal activity, no symptoms []     1    Restricted in physical strenuous activity but ambulatory, able to do out light work []     2    Ambulatory and capable of self care, unable to do work activities, up and about               >50 % of waking hours                              []     3    Only limited self care, in bed greater than 50% of waking hours []     4    Completely disabled, no self care, confined to bed or chair []     5    Moribund   Past Medical History:  Diagnosis Date   Diabetes mellitus (HCC)    Dyslipidemia    Hypertension     Past Surgical History:  Procedure Laterality Date   ESOPHAGOGASTRODUODENOSCOPY (EGD) WITH PROPOFOL N/A 04/04/2023   Procedure: ESOPHAGOGASTRODUODENOSCOPY (EGD) WITH PROPOFOL;  Surgeon: Jeani Hawking, MD;  Location: [facility];  Service: Gastroenterology;  Laterality: N/A;   IR IMAGING GUIDED PORT INSERTION  04/09/2023   UPPER ESOPHAGEAL ENDOSCOPIC ULTRASOUND (EUS) N/A 04/04/2023   Procedure: UPPER ESOPHAGEAL ENDOSCOPIC ULTRASOUND (EUS);  Surgeon: Jeani Hawking, MD;  Location: [facility];  Service:  Gastroenterology;  Laterality: N/A;    No family history on file.   Social History   Tobacco Use  Smoking Status Never  Smokeless Tobacco Never    Social History   Substance and Sexual Activity  Alcohol Use Yes   Alcohol/week: 1.0 standard drink of alcohol   Types: 1 Cans of beer per week     Allergies  Allergen Reactions   No Known Allergies     Other Reaction(s): Not available    Current Outpatient Medications  Medication Sig Dispense Refill   ACCU-CHEK GUIDE test strip 1 each by Other route 2 (two) times daily.     amLODipine (NORVASC) 5 MG tablet Take 1 tablet by mouth daily.     Aspirin 81 MG CAPS Take 81 mg by mouth daily.     atorvastatin (LIPITOR) 20 MG tablet Take 1 tablet by mouth daily.     dexamethasone (DECADRON) 4 MG tablet Take 2 tablets daily for 2 days, start  the day after chemotherapy. Take with food. 30 tablet 1   JARDIANCE 10 MG TABS tablet Take 1 tablet by mouth daily.     LANTUS SOLOSTAR 100 UNIT/ML Solostar Pen Inject 26 Units into the skin daily.     lidocaine-prilocaine (EMLA) cream Apply to affected area once 30 g 3   metFORMIN (GLUCOPHAGE) 500 MG tablet Take 2 tablets by mouth daily.     omeprazole (PRILOSEC) 40 MG capsule Take 40 mg by mouth daily.     ondansetron (ZOFRAN) 8 MG tablet Take 1 tablet (8 mg total) by mouth every 8 (eight) hours as needed for nausea or vomiting. Start on the third day after chemotherapy. 30 tablet 1   prochlorperazine (COMPAZINE) 10 MG tablet Take 1 tablet (10 mg total) by mouth every 6 (six) hours as needed for nausea or vomiting. 30 tablet 1   ramipril (ALTACE) 10 MG capsule Take 2 capsules by mouth daily.     tamsulosin (FLOMAX) 0.4 MG CAPS capsule Take 1 capsule by mouth daily.     No current facility-administered medications for this visit.    Review of Systems  Constitutional:  Positive for malaise/fatigue and weight loss.  Respiratory:  Negative for cough and shortness of breath.   Cardiovascular:   Negative for chest pain.  Gastrointestinal:  Positive for abdominal pain and heartburn.  Neurological: Negative.      PHYSICAL EXAMINATION: BP (!) 151/81 (BP Location: Right Arm, Patient Position: Sitting, Cuff Size: Normal)   Pulse 76   Resp 20   Ht 5\' 10"  (1.778 m)   Wt 181 lb (82.1 kg)   SpO2 97% Comment: RA  BMI 25.97 kg/m  Physical Exam Constitutional:      General: He is not in acute distress.    Appearance: He is not ill-appearing.  HENT:     Head: Normocephalic and atraumatic.  Eyes:     Extraocular Movements: Extraocular movements intact.  Cardiovascular:     Rate and Rhythm: Normal rate.  Pulmonary:     Effort: Pulmonary effort is normal. No respiratory distress.  Abdominal:     General: Abdomen is flat. There is no distension.  Musculoskeletal:        General: Normal range of motion.     Cervical back: Normal range of motion.  Skin:    General: Skin is warm and dry.  Neurological:     General: No focal deficit present.     Mental Status: He is alert and oriented to person, place, and time.     Diagnostic Studies & Laboratory data:     PET/CT:   EGD/EUS: Findings: ENDOSCOPIC FINDING:  A large, fungating mass with no bleeding and no stigmata of recent bleeding was found in the lower third of the esophagus, 33 cm from the incisors. The mass was non- obstructing and circumferential.  ENDOSONOGRAPHIC FINDING:  A hypoechoic mass was found in the lower third of the esophagus. The mass was encountered at 33 cm from the incisors and extended to 39 cm. The lesion was circumferential. The endosonographic borders were irregular. 50 mm. There was sonographic evidence suggesting invasion into the adventitia ( Layer 5) .  The esophageal cancer was noted to extend from 33- 39 cm. In the proximal portion it was 2/ 3rds circumfirential, but then it was circumfirential distally. There was no obstruction with the cancer. The cancer was friable. At its greatest depth it was  measured to be approximately 50 mm, however, this was possibily a tagential view.  The lesion was certainly 30 mm. The cancer involved the adventitia, but there was no invasion into the surrounding vascular structures. Two paraesophageal LNs were identified and they were pathologic appearing. In the subcarina there was evidence of an 11 mm LN, but it appeared shody and it was not amenable to FNA as it was a thin LN. In the viewed portions of the liver the was no evidence of any mets. The celiac axis appeared normal and there was no evidence of any LAD in that region. His pancreas exhibited hyperechoic strainding and some lobularity.  The staging is a T4N1/ Impression: - Malignant esophageal tumor was found in the lower third of the esophagus. - A mass was found in the lower third of the esophagus. Tissue has not been obtained. However, the endosonographic appearance is consistent with adenocarcinoma. This was staged T4 N1/ 2 Mx by endosonographic criteria. - No specimens collected.  Path: Cardia - adenocarcinoma, Esophagus moderately differentiated adenocarcinoma Radiation Hx: Began on 12/2     I have independently reviewed the above radiology studies  and reviewed the findings with the patient.   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  I will need to clarify with GI whether there is invasion into surrounding structures as this will determine his resectability.  He has begun his radiation, and we discussed the importance of getting enough protein in his diet.  I will check with him in 1  month.     I  spent 40 minutes with the patient face to face counseling and coordination of care.    [name] 04/30/2023 1:24 PM

## 2023-05-01 ENCOUNTER — Ambulatory Visit
Admission: RE | Admit: 2023-05-01 | Discharge: 2023-05-01 | Disposition: A | Discharge status: Home / Self Care | Payer: 59 | Source: Ambulatory Visit | Attending: Radiation Oncology | Admitting: Radiation Oncology

## 2023-05-01 ENCOUNTER — Other Ambulatory Visit: Payer: Self-pay

## 2023-05-01 DIAGNOSIS — Z5111 Encounter for antineoplastic chemotherapy: Secondary | ICD-10-CM | POA: Diagnosis not present

## 2023-05-01 LAB — RAD ONC ARIA SESSION SUMMARY
Course Elapsed Days: 10
Plan Fractions Treated to Date: 9
Plan Prescribed Dose Per Fraction: 1.8 Gy
Plan Total Fractions Prescribed: 25
Plan Total Prescribed Dose: 45 Gy
Reference Point Dosage Given to Date: 16.2 Gy
Reference Point Session Dosage Given: 1.8 Gy
Session Number: 9

## 2023-05-02 ENCOUNTER — Encounter: Payer: Self-pay | Admitting: Oncology

## 2023-05-02 ENCOUNTER — Inpatient Hospital Stay: Payer: 59

## 2023-05-02 ENCOUNTER — Other Ambulatory Visit: Payer: Self-pay

## 2023-05-02 ENCOUNTER — Ambulatory Visit
Admission: RE | Admit: 2023-05-02 | Discharge: 2023-05-02 | Disposition: A | Discharge status: Home / Self Care | Payer: 59 | Source: Ambulatory Visit | Attending: Radiation Oncology

## 2023-05-02 ENCOUNTER — Inpatient Hospital Stay (HOSPITAL_BASED_OUTPATIENT_CLINIC_OR_DEPARTMENT_OTHER): Payer: 59 | Admitting: Oncology

## 2023-05-02 VITALS — BP 137/76 | HR 77 | Temp 98.0°F | Resp 17

## 2023-05-02 DIAGNOSIS — T451X5A Adverse effect of antineoplastic and immunosuppressive drugs, initial encounter: Secondary | ICD-10-CM | POA: Insufficient documentation

## 2023-05-02 DIAGNOSIS — D472 Monoclonal gammopathy: Secondary | ICD-10-CM | POA: Diagnosis not present

## 2023-05-02 DIAGNOSIS — D701 Agranulocytosis secondary to cancer chemotherapy: Secondary | ICD-10-CM | POA: Diagnosis not present

## 2023-05-02 DIAGNOSIS — C155 Malignant neoplasm of lower third of esophagus: Secondary | ICD-10-CM

## 2023-05-02 DIAGNOSIS — N1831 Chronic kidney disease, stage 3a: Secondary | ICD-10-CM

## 2023-05-02 DIAGNOSIS — Z5111 Encounter for antineoplastic chemotherapy: Secondary | ICD-10-CM | POA: Diagnosis not present

## 2023-05-02 DIAGNOSIS — Z95828 Presence of other vascular implants and grafts: Secondary | ICD-10-CM | POA: Insufficient documentation

## 2023-05-02 LAB — RAD ONC ARIA SESSION SUMMARY
Course Elapsed Days: 11
Plan Fractions Treated to Date: 10
Plan Prescribed Dose Per Fraction: 1.8 Gy
Plan Total Fractions Prescribed: 25
Plan Total Prescribed Dose: 45 Gy
Reference Point Dosage Given to Date: 18 Gy
Reference Point Session Dosage Given: 1.8 Gy
Session Number: 10

## 2023-05-02 LAB — CBC WITH DIFFERENTIAL (CANCER CENTER ONLY)
Abs Immature Granulocytes: 0.02 10*3/uL (ref 0.00–0.07)
Basophils Absolute: 0 10*3/uL (ref 0.0–0.1)
Basophils Relative: 1 %
Eosinophils Absolute: 0.2 10*3/uL (ref 0.0–0.5)
Eosinophils Relative: 5 %
HCT: 39.2 % (ref 39.0–52.0)
Hemoglobin: 12.8 g/dL — ABNORMAL LOW (ref 13.0–17.0)
Immature Granulocytes: 1 %
Lymphocytes Relative: 17 %
Lymphs Abs: 0.6 10*3/uL — ABNORMAL LOW (ref 0.7–4.0)
MCH: 28.6 pg (ref 26.0–34.0)
MCHC: 32.7 g/dL (ref 30.0–36.0)
MCV: 87.7 fL (ref 80.0–100.0)
Monocytes Absolute: 0.3 10*3/uL (ref 0.1–1.0)
Monocytes Relative: 9 %
Neutro Abs: 2.2 10*3/uL (ref 1.7–7.7)
Neutrophils Relative %: 67 %
Platelet Count: 210 10*3/uL (ref 150–400)
RBC: 4.47 MIL/uL (ref 4.22–5.81)
RDW: 13.7 % (ref 11.5–15.5)
WBC Count: 3.2 10*3/uL — ABNORMAL LOW (ref 4.0–10.5)
nRBC: 0 % (ref 0.0–0.2)

## 2023-05-02 LAB — CMP (CANCER CENTER ONLY)
ALT: 11 U/L (ref 0–44)
AST: 13 U/L — ABNORMAL LOW (ref 15–41)
Albumin: 3.9 g/dL (ref 3.5–5.0)
Alkaline Phosphatase: 95 U/L (ref 38–126)
Anion gap: 5 (ref 5–15)
BUN: 24 mg/dL — ABNORMAL HIGH (ref 8–23)
CO2: 28 mmol/L (ref 22–32)
Calcium: 9.1 mg/dL (ref 8.9–10.3)
Chloride: 107 mmol/L (ref 98–111)
Creatinine: 1.36 mg/dL — ABNORMAL HIGH (ref 0.61–1.24)
GFR, Estimated: 57 mL/min — ABNORMAL LOW (ref >60)
Glucose, Bld: 274 mg/dL — ABNORMAL HIGH (ref 70–99)
Potassium: 4.4 mmol/L (ref 3.5–5.1)
Sodium: 140 mmol/L (ref 135–145)
Total Bilirubin: 0.4 mg/dL (ref <1.2)
Total Protein: 6.8 g/dL (ref 6.5–8.1)

## 2023-05-02 MED ORDER — SODIUM CHLORIDE 0.9 % IV SOLN
176.8000 mg | Freq: Once | INTRAVENOUS | Status: AC
  Administered 2023-05-02: 180 mg via INTRAVENOUS
  Filled 2023-05-02: qty 18

## 2023-05-02 MED ORDER — PALONOSETRON HCL INJECTION 0.25 MG/5ML
0.2500 mg | Freq: Once | INTRAVENOUS | Status: AC
  Administered 2023-05-02: 0.25 mg via INTRAVENOUS
  Filled 2023-05-02: qty 5

## 2023-05-02 MED ORDER — DEXAMETHASONE SODIUM PHOSPHATE 10 MG/ML IJ SOLN
10.0000 mg | Freq: Once | INTRAMUSCULAR | Status: AC
  Administered 2023-05-02: 10 mg via INTRAVENOUS
  Filled 2023-05-02: qty 1

## 2023-05-02 MED ORDER — SODIUM CHLORIDE 0.9 % IV SOLN: INTRAVENOUS | Status: DC

## 2023-05-02 MED ORDER — HEPARIN SOD (PORK) LOCK FLUSH 100 UNIT/ML IV SOLN
500.0000 [IU] | Freq: Once | INTRAVENOUS | Status: AC | PRN
  Administered 2023-05-02: 500 [IU]

## 2023-05-02 MED ORDER — SODIUM CHLORIDE 0.9% FLUSH
10.0000 mL | Freq: Once | INTRAVENOUS | Status: AC
Start: 2023-05-02 — End: 2023-05-02
  Administered 2023-05-02: 10 mL

## 2023-05-02 MED ORDER — DIPHENHYDRAMINE HCL 50 MG/ML IJ SOLN
50.0000 mg | Freq: Once | INTRAMUSCULAR | Status: AC
  Administered 2023-05-02: 50 mg via INTRAVENOUS
  Filled 2023-05-02: qty 1

## 2023-05-02 MED ORDER — SODIUM CHLORIDE 0.9% FLUSH
10.0000 mL | INTRAVENOUS | Status: DC | PRN
  Administered 2023-05-02: 10 mL

## 2023-05-02 MED ORDER — FAMOTIDINE IN NACL 20-0.9 MG/50ML-% IV SOLN
20.0000 mg | Freq: Once | INTRAVENOUS | Status: AC
  Administered 2023-05-02: 20 mg via INTRAVENOUS
  Filled 2023-05-02: qty 50

## 2023-05-02 MED ORDER — SODIUM CHLORIDE 0.9 % IV SOLN
50.0000 mg/m2 | Freq: Once | INTRAVENOUS | Status: AC
  Administered 2023-05-02: 102 mg via INTRAVENOUS
  Filled 2023-05-02: qty 17

## 2023-05-02 NOTE — Assessment & Plan Note (Signed)
-   White count 3200 but ANC is 2200.  Will proceed with chemotherapy today.  Will continue to monitor labs each week and defer treatment if ANC is below 1000.

## 2023-05-02 NOTE — Assessment & Plan Note (Signed)
-  He has been on surveillance for IgG kappa MGUS. [name]  Repeat myeloma labs from 03/27/2023 showed M protein of 0.6 g/dL, stable.  IgG kappa type on IFE.  Kappa/lambda light chain ratio stable at 5.24.  Overall picture consistent with MGUS.  Will continue surveillance alone.

## 2023-05-02 NOTE — Patient Instructions (Signed)
 [facility] - A DEPT OF [facility]  Discharge Instructions: Thank you for choosing [facility] to provide your oncology and hematology care.   If you have a lab appointment with the Cancer Center, please go directly to the Cancer Center and check in at the registration area.   Wear comfortable clothing and clothing appropriate for easy access to any Portacath or PICC line.   We strive to give you quality time with your provider. You may need to reschedule your appointment if you arrive late (15 or more minutes).  Arriving late affects you and other patients whose appointments are after yours.  Also, if you miss three or more appointments without notifying the office, you may be dismissed from the clinic at the provider's discretion.      For prescription refill requests, have your pharmacy contact our office and allow 72 hours for refills to be completed.    Today you received the following chemotherapy and/or immunotherapy agents Paclitaxel, Carboplatin   To help prevent nausea and vomiting after your treatment, we encourage you to take your nausea medication as directed.  BELOW ARE SYMPTOMS THAT SHOULD BE REPORTED IMMEDIATELY: *FEVER GREATER THAN 100.4 F (38 C) OR HIGHER *CHILLS OR SWEATING *NAUSEA AND VOMITING THAT IS NOT CONTROLLED WITH YOUR NAUSEA MEDICATION *UNUSUAL SHORTNESS OF BREATH *UNUSUAL BRUISING OR BLEEDING *URINARY PROBLEMS (pain or burning when urinating, or frequent urination) *BOWEL PROBLEMS (unusual diarrhea, constipation, pain near the anus) TENDERNESS IN MOUTH AND THROAT WITH OR WITHOUT PRESENCE OF ULCERS (sore throat, sores in mouth, or a toothache) UNUSUAL RASH, SWELLING OR PAIN  UNUSUAL VAGINAL DISCHARGE OR ITCHING   Items with * indicate a potential emergency and should be followed up as soon as possible or go to the Emergency Department if any problems should occur.  Please show the CHEMOTHERAPY ALERT CARD or  IMMUNOTHERAPY ALERT CARD at check-in to the Emergency Department and triage nurse.  Should you have questions after your visit or need to cancel or reschedule your appointment, please contact [facility] - A DEPT OF [facility]  Dept: [phone]  and follow the prompts.  Office hours are 8:00 a.m. to 4:30 p.m. Monday - Friday. Please note that voicemails left after 4:00 p.m. may not be returned until the following business day.  We are closed weekends and major holidays. You have access to a nurse at all times for urgent questions. Please call the main number to the clinic Dept: [phone] and follow the prompts.   For any non-urgent questions, you may also contact your provider using MyChart. We now offer e-Visits for anyone 1 and older to request care online for non-urgent symptoms. For details visit [email or web address].   Also download the MyChart app! Go to the app store, search "MyChart", open the app, select [facility], and log in with your MyChart username and password.

## 2023-05-02 NOTE — Progress Notes (Signed)
[facility]  ONCOLOGY CLINIC PROGRESS NOTE   Patient Care Team: Shelle Iron, MD as PCP - General (Family Medicine) Misenheimer, Marcial Pacas, MD as Consulting Physician (Gastroenterology) Meryl Crutch, MD as Consulting Physician (Hematology and Oncology)  Date of visit: 05/02/2023   ASSESSMENT & PLAN:   67 y.o.  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  Concern for lymph node involvement based on CT scan.  Significant weight loss (40-50 lbs) over the past 9-10 months.   - Previously I discussed tentative staging, prognosis, plan of care, treatment options with the patient and his wife.  Reviewed NCCN guidelines.  Explained to them that treatment of esophageal cancer involves multidisciplinary approach including cardiothoracic surgery, medical oncology, radiation oncology, gastroenterology.    -I discussed his case with Dr. Jennye Boroughs previously.  Since they do not perform EUS, referral sent to local GI Dr. Jeani Hawking for EUS to help with staging. On 04/04/2023, Dr. Elnoria Howard performed upper EUS.  It was at least T3, N 1/2, MX by endosonographic criteria.   -Staging PET scan on 04/07/2023 showed distal esophageal/GE junction adenocarcinoma.  Lower paraesophageal nodal metastasis.  No evidence of distant metastatic disease.  -Referral sent to cardiothoracic surgeon Dr. Cliffton Asters and to Dr. Mitzi Hansen, radiation oncologist for concurrent chemoradiation planning in the neoadjuvant setting.  -Discussed different treatment options.  Discussed risks versus benefits with each modality/regimen. Plan made to proceed with chemotherapy with carboplatin and paclitaxel, once  weekly, in conjunction with radiation therapy.  -He started radiation treatments under the direction of Dr. Mitzi Hansen from 04/21/2023.  He started chemotherapy with carboplatin and paclitaxel from 04/25/2023.    -Due for dose #2 of carboplatin and paclitaxel today.  Labs reveal mild leukopenia but ANC is 2200.  No other dose-limiting toxicities.  Will proceed with cycle #2 of carboplatin and paclitaxel today and continue this weekly.  -Order additional testing on biopsy specimen for potential targeted therapy options including MMR testing, PD-L1 testing  -Plan for potential surgery following chemoradiation, pending further staging and response to treatment.  -He was advised to continue omeprazole daily.  MGUS (monoclonal gammopathy of unknown significance) -He has been on surveillance for IgG kappa MGUS. [name]  Repeat myeloma labs from 03/27/2023 showed M protein of 0.6 g/dL, stable.  IgG kappa type on IFE.  Kappa/lambda light chain ratio stable at 5.24.  Overall picture consistent with MGUS.  Will continue surveillance alone.  CKD (chronic kidney disease) stage 3, GFR 30-59 ml/min (HCC) History of kidney issues related to diabetes, creatinine slightly worse at 1.36 today.  Patient seeing a kidney specialist. -Monitor kidney function during chemotherapy, ensure patient stays hydrated.  He was advised to drink at least 80 ounces of water a day.  Leukopenia due to antineoplastic chemotherapy (HCC) - White count 3200 but ANC is 2200.  Will proceed with chemotherapy today.  Will continue to monitor labs each week and defer treatment if ANC is below 1000.    I will see him in 1 week for follow-up with repeat labs and for cycle 3 of chemotherapy.  He was advised to maintain adequate hydration to preserve his renal function.  I reviewed lab results and outside records for this visit and discussed relevant results with the patient.  Diagnosis, plan of care and treatment options were also discussed in detail  with the patient. Opportunity provided to ask questions and answers provided to his apparent satisfaction. Provided instructions to call our clinic with any problems, questions or concerns prior to return visit. I recommended to continue follow-up with PCP and sub-specialists. He verbalized understanding and agreed with the plan.   NCCN guidelines have been consulted in the planning of this patient's care.  I provided 30 minutes of face-to-face time during this encounter and > 50% was spent counseling as documented under my assessment and plan.    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  Radiation treatment started from 04/21/2023.  Chemotherapy started from 04/25/2023.  INTERVAL HISTORY:    Discussed the use of AI scribe software for clinical note transcription with the patient, who gave verbal consent to proceed.   Seth Burch is here today for repeat clinical assessment.   He reports feeling 'a little bit on the tired side' on the second and third day after chemotherapy, but otherwise denies any major side effects. He also reports that the radiation treatments are going 'about the same.' He has met with Dr. Cliffton Asters and is scheduled to see him again in about a month.  The patient also mentions a concern about bruising around the upper portion of his port, where it goes into the vein. He denies any issues with swallowing and reports that his eating has been 'great.' He has not had any issues with blood in stools or black stools.  The patient admits to not drinking enough  water, but does consume a lot of fluids, specifically Propel, which contains electrolytes. He denies consuming a lot of caffeinated drinks.  I have reviewed the past medical history, past surgical history, social history and family history with the patient and they are unchanged from previous note.  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 Jennye Boroughs after he presented to his PCP with complaints of dysphagia and weight loss.  He started noticing difficulty in swallowing in January or February 2024.  He also reported unintentional weight loss of about 20 pounds in 1 year.  Previously he used to take ranitidine for intermittent acid reflux but it was stopped approximately in 2018.  He was started on omeprazole and an EGD was planned for further evaluation.  Also screening colonoscopy was recommended since last colonoscopy was in 2012.   03/11/2023 Pathology Results   Biopsy of stomach/gastric cardia: Cardiac mucosa showing at least intramucosal adenocarcinoma.  Biopsy of esophageal mass: Invasive, moderately differentiated adenocarcinoma.   03/11/2023 Procedure   EGD with biopsy by Dr.Misenheimer: Large mass in the distal esophagus between 30 and 38 cm.  Probable infiltration of the mass in the gastric cardia.  Colonoscopy on 03/11/2023: Mild diverticular disease in the left colon.   03/14/2023 Imaging   CT chest, abdomen and pelvis: Circumferential, masslike thickening in the lower third of esophagus, 7 cm  in length, consistent with known primary esophageal malignancy.  Enlarged right paraesophageal lymph node measuring 1.2 x 0.8 cm, consistent with nodal metastatic disease.  No other evidence of lymphadenopathy or metastatic disease in the chest, abdomen or pelvis.  Background of very fine  centrilobular pulmonary nodules, most concentrated in the lung apices as well as mild diffuse bronchial wall thickening, most commonly seen in smoking-related respiratory bronchiolitis.  Prostatomegaly.   04/04/2023 Procedure   EUS by Dr.Hung: cT3 or cT4, N1/N2,Mx disease.   04/07/2023 PET scan   PET scan: distal esophageal/GE junction adenocarcinoma.  Lower paraesophageal nodal metastasis.  No evidence of distant metastatic disease.   04/24/2023 Cancer Staging   Staging form: Esophagus - Adenocarcinoma, AJCC 8th Edition - Clinical: Stage III (cT3, cN1, cM0, G2) - Signed by Meryl Crutch, MD on 04/24/2023 Histologic grading system: 3 grade system   04/25/2023 -  Chemotherapy   Patient is on Treatment Plan : ESOPHAGUS Carboplatin + Paclitaxel Weekly X 6 Weeks with XRT         REVIEW OF SYSTEMS:   Review of Systems - Oncology  All other pertinent systems were reviewed with the patient and are negative.  ALLERGIES: He is allergic to no known allergies.  MEDICATIONS:  Current Outpatient Medications  Medication Sig Dispense Refill   ACCU-CHEK GUIDE test strip 1 each by Other route 2 (two) times daily.     amLODipine (NORVASC) 5 MG tablet Take 1 tablet by mouth daily.     Aspirin 81 MG CAPS Take 81 mg by mouth daily.     atorvastatin (LIPITOR) 20 MG tablet Take 1 tablet by mouth daily.     dexamethasone (DECADRON) 4 MG tablet Take 2 tablets daily for 2 days, start the day after chemotherapy. Take with food. 30 tablet 1   JARDIANCE 10 MG TABS tablet Take 1 tablet by mouth daily.     LANTUS SOLOSTAR 100 UNIT/ML Solostar Pen Inject 26 Units into the skin daily.     lidocaine-prilocaine (EMLA) cream Apply to affected area once 30 g 3   metFORMIN (GLUCOPHAGE) 500 MG tablet Take 2 tablets by mouth daily.     omeprazole (PRILOSEC) 40 MG capsule Take 40 mg by mouth daily.     ondansetron (ZOFRAN) 8 MG tablet Take 1 tablet (8 mg total) by mouth every 8 (eight) hours as needed for nausea or  vomiting. Start on the third day after chemotherapy. 30 tablet 1   prochlorperazine (COMPAZINE) 10 MG tablet Take 1 tablet (10 mg total) by mouth every 6 (six) hours as needed for nausea or vomiting. 30 tablet 1   ramipril (ALTACE) 10 MG capsule Take 2 capsules by mouth daily.     tamsulosin (FLOMAX) 0.4 MG CAPS capsule Take 1 capsule by mouth daily.     No current facility-administered medications for this visit.   Facility-Administered Medications Ordered in Other Visits  Medication Dose Route Frequency Provider Last Rate Last Admin   0.9 %  sodium chloride infusion   Intravenous Continuous River Ambrosio, MD 10 mL/hr at 05/02/23 1209 New Bag at 05/02/23 1209   CARBOplatin (PARAPLATIN) 180 mg in sodium chloride 0.9 % 100 mL chemo infusion  180 mg Intravenous Once Khaya Theissen, MD 236 mL/hr at 05/02/23 1430 180 mg at 05/02/23 1430   heparin lock flush 100 unit/mL  500 Units Intracatheter Once PRN Keaisha Sublette, MD       sodium chloride flush (NS) 0.9 % injection 10 mL  10 mL Intracatheter PRN Lizabeth Fellner,  Breella Vanostrand, MD         VITALS:   Blood pressure 137/76, pulse 77, temperature 98 F (36.7 C), resp. rate 17, SpO2 100%.  Wt Readings from Last 3 Encounters:  04/25/23 181 lb (82.1 kg)  04/25/23 180 lb 8 oz (81.9 kg)  04/24/23 179 lb 11.2 oz (81.5 kg)    There is no height or weight on file to calculate BMI.  Performance status (ECOG): 1 - Symptomatic but completely ambulatory  PHYSICAL EXAM:   Physical Exam Constitutional:      General: He is not in acute distress.    Appearance: Normal appearance.  HENT:     Head: Normocephalic and atraumatic.  Eyes:     General: No scleral icterus.    Conjunctiva/sclera: Conjunctivae normal.  Cardiovascular:     Rate and Rhythm: Normal rate and regular rhythm.     Heart sounds: Normal heart sounds.  Pulmonary:     Effort: Pulmonary effort is normal.     Breath sounds: Normal breath sounds.  Chest:     Comments: Right-sided Port-A-Cath in  place without any signs of infection Abdominal:     General: There is no distension.  Musculoskeletal:     Right lower leg: No edema.     Left lower leg: No edema.  Neurological:     General: No focal deficit present.     Mental Status: He is alert and oriented to person, place, and time.  Psychiatric:        Mood and Affect: Mood normal.        Behavior: Behavior normal.        Thought Content: Thought content normal.      LABORATORY DATA:   I have reviewed the data as listed.   Results for orders placed or performed in visit on 05/02/23 (from the past 72 hours)  CMP (Cancer Center only)     Status: Abnormal   Collection Time: 05/02/23 10:36 AM  Result Value Ref Range   Sodium 140 135 - 145 mmol/L   Potassium 4.4 3.5 - 5.1 mmol/L   Chloride 107 98 - 111 mmol/L   CO2 28 22 - 32 mmol/L   Glucose, Bld 274 (H) 70 - 99 mg/dL    Comment: Glucose reference range applies only to samples taken after fasting for at least 8 hours.   BUN 24 (H) 8 - 23 mg/dL   Creatinine 4.09 (H) 8.11 - 1.24 mg/dL   Calcium 9.1 8.9 - 91.4 mg/dL   Total Protein 6.8 6.5 - 8.1 g/dL   Albumin 3.9 3.5 - 5.0 g/dL   AST 13 (L) 15 - 41 U/L   ALT 11 0 - 44 U/L   Alkaline Phosphatase 95 38 - 126 U/L   Total Bilirubin 0.4 <1.2 mg/dL   GFR, Estimated 57 (L) >60 mL/min    Comment: (NOTE) Calculated using the CKD-EPI Creatinine Equation (2021)    Anion gap 5 5 - 15    Comment: Performed at [facility], [address], [place], [place] [address]  CBC with Differential (Cancer Center Only)     Status: Abnormal   Collection Time: 05/02/23 10:36 AM  Result Value Ref Range   WBC Count 3.2 (L) 4.0 - 10.5 K/uL   RBC 4.47 4.22 - 5.81 MIL/uL   Hemoglobin 12.8 (L) 13.0 - 17.0 g/dL   HCT 62.1 30.8 - 65.7 %   MCV 87.7 80.0 - 100.0 fL   MCH 28.6 26.0 -  34.0 pg   MCHC 32.7 30.0 - 36.0 g/dL   RDW 52.8 41.3 - 24.4 %   Platelet Count 210 150 - 400 K/uL   nRBC 0.0 0.0 - 0.2 %   Neutrophils  Relative % 67 %   Neutro Abs 2.2 1.7 - 7.7 K/uL   Lymphocytes Relative 17 %   Lymphs Abs 0.6 (L) 0.7 - 4.0 K/uL   Monocytes Relative 9 %   Monocytes Absolute 0.3 0.1 - 1.0 K/uL   Eosinophils Relative 5 %   Eosinophils Absolute 0.2 0.0 - 0.5 K/uL   Basophils Relative 1 %   Basophils Absolute 0.0 0.0 - 0.1 K/uL   Immature Granulocytes 1 %   Abs Immature Granulocytes 0.02 0.00 - 0.07 K/uL    Comment: Performed at [facility], [address] [place], [place] [address]     RADIOGRAPHIC STUDIES:  I have personally reviewed the radiological images as listed and agree with the findings in the report.  IR IMAGING GUIDED PORT INSERTION Result Date: 04/09/2023 INDICATION: 67 year old male with a history of primary adenocarcinoma of the distal third of the esophagus. He presents for port catheter placement for chemotherapy. EXAM: IMPLANTED PORT A CATH PLACEMENT WITH ULTRASOUND AND FLUOROSCOPIC GUIDANCE MEDICATIONS: None. ANESTHESIA/SEDATION: Versed 4 mg IV; Fentanyl 100 mcg IV administered by the radiology nurse under my supervision. Moderate Sedation Time:  16 minutes The patient's vital signs and level of consciousness were continuously monitored during the procedure by the interventional radiology nurse under my direct supervision. FLUOROSCOPY: Radiation exposure index: 1 mGy reference air kerma COMPLICATIONS: None immediate. PROCEDURE: The right neck and chest was prepped with chlorhexidine, and draped in the usual sterile fashion using maximum barrier technique (cap and mask, sterile gown, sterile gloves, large sterile sheet, hand hygiene and cutaneous antiseptic). Local anesthesia was attained by infiltration with 1% lidocaine with epinephrine. Ultrasound demonstrated patency of the right internal jugular vein, and this was documented with an image. Under real-time ultrasound guidance, this vein was accessed with a 21 gauge micropuncture needle and image documentation was  performed. A small dermatotomy was made at the access site with an 11 scalpel. A 0.018" wire was advanced into the SVC and the access needle exchanged for a 68F micropuncture vascular sheath. The 0.018" wire was then removed and a 0.035" wire advanced into the IVC. An appropriate location for the subcutaneous reservoir was selected below the clavicle and an incision was made through the skin and underlying soft tissues. The subcutaneous tissues were then dissected using a combination of blunt and sharp surgical technique and a pocket was formed. A single lumen power injectable portacatheter [facility]) was then tunneled through the subcutaneous tissues from the pocket to the dermatotomy and the port reservoir placed within the subcutaneous pocket. The venous access site was then serially dilated and a peel away vascular sheath placed over the wire. The wire was removed and the port catheter advanced into position under fluoroscopic guidance. The catheter tip is positioned in the superior cavoatrial junction. This was documented with a spot image. The portacatheter was then tested and found to flush and aspirate well. The port was flushed with saline followed by 100 units/mL heparinized saline. The pocket was then closed in two layers using first subdermal inverted interrupted absorbable sutures followed by a running subcuticular suture. The epidermis was then sealed with Dermabond. The dermatotomy at the venous access site was also closed with Dermabond. IMPRESSION: Successful placement of a right IJ  approach Smart Port with ultrasound and fluoroscopic guidance. The catheter is ready for use. Electronically Signed   By: Malachy Moan M.D.   On: 04/09/2023 12:47   NM PET Image Initial (PI) Skull Base To Thigh Result Date: 04/08/2023 CLINICAL DATA:  Initial treatment strategy for distal esophageal cancer. EXAM: NUCLEAR MEDICINE PET SKULL BASE TO THIGH TECHNIQUE: 9.2 mCi F-18 FDG was injected  intravenously. Full-ring PET imaging was performed from the skull base to thigh after the radiotracer. CT data was obtained and used for attenuation correction and anatomic localization. Fasting blood glucose: 134 mg/dl COMPARISON:  CT chest abdomen pelvis dated 03/14/2023 FINDINGS: Mediastinal blood pool activity: SUV max 2.9 Liver activity: SUV max NA NECK: No hypermetabolic lymph nodes in the neck. Incidental CT findings: None. CHEST: Long segment masslike wall thickening extending from the distal esophagus to the gastric cardia, max SUV 26.2, suggesting distal esophageal/GE junction adenocarcinoma. Isolated 7 mm short axis lower paraesophageal node (series 4/image 87), max SUV 4.1, suspicious for nodal metastasis. No suspicious pulmonary nodules. Incidental CT findings: Mild atherosclerotic calcifications of the aortic arch. ABDOMEN/PELVIS: No abnormal hypermetabolic activity within the liver, pancreas, adrenal glands, or spleen. No hypermetabolic lymph nodes in the abdomen or pelvis. Incidental CT findings: Left renal cysts, benign. Prostatomegaly, suggesting BPH. Small fat containing bilateral inguinal hernias. Mild atherosclerotic calcifications abdominal aorta. SKELETON: No focal hypermetabolic activity to suggest skeletal metastasis. Incidental CT findings: Mild degenerative changes of the visualized thoracolumbar spine. IMPRESSION: Distal esophageal/GE junction adenocarcinoma, as above. Lower paraesophageal nodal metastasis. No evidence of metastatic disease in the abdomen/pelvis. Electronically Signed   By: Charline Bills M.D.   On: 04/08/2023 00:13    CODE STATUS:   No orders of the defined types were placed in this encounter.    Future Appointments  Date Time Provider Department Center  05/02/2023  3:45 PM [facility] [id number] [facility] None  05/02/2023  4:00 PM [facility]AC-MOODY [facility] None  05/05/2023  3:45 PM [facility] [id number] [facility] None  05/06/2023  3:45 PM [facility]  [id number] [facility] None  05/07/2023  3:45 PM [facility] [id number] [facility] None  05/08/2023  3:45 PM [facility] [id number] [facility] None  05/09/2023 11:00 AM [facility] FLUSH [facility] None  05/09/2023 11:20 AM Kresta Templeman, MD [facility] None  05/09/2023 12:00 PM [facility] INFUSION [facility] None  05/09/2023  4:00 PM [facility] [id number] [facility] None  05/12/2023  3:45 PM [facility] [id number] [facility] None  05/13/2023  1:00 PM [facility] [id number] [facility] None  05/15/2023  3:45 PM [facility] [id number] [facility] None  05/16/2023 11:00 AM [facility] FLUSH [facility] None  05/16/2023 11:20 AM Sora Olivo, MD [facility] None  05/16/2023 12:00 PM [facility] INFUSION [facility] None  05/16/2023  3:45 PM [facility] [id number] [facility] None  05/19/2023  3:45 PM [facility] [id number] [facility] None  05/20/2023  1:30 PM [facility] [id number] [facility] None  05/22/2023 11:15 AM [facility] FLUSH [facility] None  05/22/2023 11:40 AM Shyheim Tanney, MD [facility] None  05/22/2023 12:30 PM [facility] INFUSION [facility] None  05/22/2023  4:15 PM [facility] [id number] [facility] None  05/23/2023  3:45 PM [facility] [id number] [facility] None  05/26/2023  3:45 PM [facility] [id number] [facility] None  05/27/2023  3:45 PM [facility] [id number] [facility] None  05/28/2023  3:45 PM [facility] [id number] [facility] None  05/29/2023  3:45 PM [facility] [id number] [facility] None  05/30/2023 11:15 AM [facility] FLUSH [facility] None  05/30/2023 11:40 AM Braidyn Scorsone, MD [facility] None  05/30/2023 12:30 PM [facility] INFUSION [facility] None  05/30/2023  4:15 PM [facility] LINAC 3 [facility] None  06/12/2023  2:00 PM Bonnita Levan, RD [facility] NDM  06/13/2023  2:00 PM Lightfoot, Eliezer Lofts, MD [facility] TCTSG      This document was completed utilizing speech recognition software. Grammatical errors, random word insertions, pronoun errors, and incomplete  sentences are an occasional consequence of this system due to software limitations, ambient noise, and hardware issues. Any formal questions or concerns about the content, text or information contained within the body of this dictation should be directly addressed to the provider for clarification.

## 2023-05-02 NOTE — Assessment & Plan Note (Signed)
History of kidney issues related to diabetes, creatinine slightly worse at 1.36 today.  Patient seeing a kidney specialist. -Monitor kidney function during chemotherapy, ensure patient stays hydrated.  He was advised to drink at least 80 ounces of water a day.

## 2023-05-02 NOTE — Assessment & Plan Note (Addendum)
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  Concern for lymph node involvement based on CT scan.  Significant weight loss (40-50 lbs) over the past 9-10 months.   - Previously I discussed tentative staging, prognosis, plan of care, treatment options with the patient and his wife.  Reviewed NCCN guidelines.  Explained to them that treatment of esophageal cancer involves multidisciplinary approach including cardiothoracic surgery, medical oncology, radiation oncology, gastroenterology.    -I discussed his case with Dr. Jennye Boroughs previously.  Since they do not perform EUS, referral sent to local GI Dr. Jeani Hawking for EUS to help with staging. On 04/04/2023, Dr. Elnoria Howard performed upper EUS.  It was at least T3, N 1/2, MX by endosonographic criteria.   -Staging PET scan on 04/07/2023 showed distal esophageal/GE junction adenocarcinoma.  Lower paraesophageal nodal metastasis.  No evidence of distant metastatic disease.  -Referral sent to cardiothoracic surgeon Dr. Cliffton Asters and to Dr. Mitzi Hansen, radiation oncologist for concurrent chemoradiation planning in the neoadjuvant setting.  -Discussed different treatment options.  Discussed risks versus benefits with each modality/regimen. Plan made to proceed with chemotherapy with carboplatin and paclitaxel, once weekly, in conjunction with radiation therapy.  -He started radiation treatments under the direction of Dr. Mitzi Hansen from 04/21/2023.  He started chemotherapy with carboplatin and paclitaxel from 04/25/2023.    -Due for dose #2 of carboplatin and paclitaxel today.  Labs reveal mild leukopenia but ANC is 2200.  No other dose-limiting toxicities.  Will proceed with cycle #2 of carboplatin and paclitaxel today and continue this weekly.  -Order additional testing on biopsy specimen for potential targeted therapy options including MMR testing, PD-L1 testing  -Plan for potential surgery following chemoradiation, pending further  staging and response to treatment.  -He was advised to continue omeprazole daily.

## 2023-05-05 ENCOUNTER — Other Ambulatory Visit: Payer: Self-pay

## 2023-05-05 ENCOUNTER — Other Ambulatory Visit: Payer: Self-pay | Admitting: Oncology

## 2023-05-05 ENCOUNTER — Ambulatory Visit
Admission: RE | Admit: 2023-05-05 | Discharge: 2023-05-05 | Disposition: A | Discharge status: Home / Self Care | Payer: 59 | Source: Ambulatory Visit | Attending: Radiation Oncology

## 2023-05-05 DIAGNOSIS — Z5111 Encounter for antineoplastic chemotherapy: Secondary | ICD-10-CM | POA: Diagnosis not present

## 2023-05-05 LAB — RAD ONC ARIA SESSION SUMMARY
Course Elapsed Days: 14
Plan Fractions Treated to Date: 11
Plan Prescribed Dose Per Fraction: 1.8 Gy
Plan Total Fractions Prescribed: 25
Plan Total Prescribed Dose: 45 Gy
Reference Point Dosage Given to Date: 19.8 Gy
Reference Point Session Dosage Given: 1.8 Gy
Session Number: 11

## 2023-05-06 ENCOUNTER — Ambulatory Visit
Admission: RE | Admit: 2023-05-06 | Discharge: 2023-05-06 | Disposition: A | Discharge status: Home / Self Care | Payer: 59 | Source: Ambulatory Visit | Attending: Radiation Oncology | Admitting: Radiation Oncology

## 2023-05-06 ENCOUNTER — Other Ambulatory Visit: Payer: Self-pay

## 2023-05-06 DIAGNOSIS — Z5111 Encounter for antineoplastic chemotherapy: Secondary | ICD-10-CM | POA: Diagnosis not present

## 2023-05-06 LAB — RAD ONC ARIA SESSION SUMMARY
Course Elapsed Days: 15
Plan Fractions Treated to Date: 12
Plan Prescribed Dose Per Fraction: 1.8 Gy
Plan Total Fractions Prescribed: 25
Plan Total Prescribed Dose: 45 Gy
Reference Point Dosage Given to Date: 21.6 Gy
Reference Point Session Dosage Given: 1.8 Gy
Session Number: 12

## 2023-05-07 ENCOUNTER — Other Ambulatory Visit: Payer: Self-pay

## 2023-05-07 ENCOUNTER — Ambulatory Visit
Admission: RE | Admit: 2023-05-07 | Discharge: 2023-05-07 | Disposition: A | Discharge status: Home / Self Care | Payer: 59 | Source: Ambulatory Visit | Attending: Radiation Oncology | Admitting: Radiation Oncology

## 2023-05-07 DIAGNOSIS — Z5111 Encounter for antineoplastic chemotherapy: Secondary | ICD-10-CM | POA: Diagnosis not present

## 2023-05-07 LAB — RAD ONC ARIA SESSION SUMMARY
Course Elapsed Days: 16
Plan Fractions Treated to Date: 13
Plan Prescribed Dose Per Fraction: 1.8 Gy
Plan Total Fractions Prescribed: 25
Plan Total Prescribed Dose: 45 Gy
Reference Point Dosage Given to Date: 23.4 Gy
Reference Point Session Dosage Given: 1.8 Gy
Session Number: 13

## 2023-05-08 ENCOUNTER — Other Ambulatory Visit: Payer: Self-pay

## 2023-05-08 ENCOUNTER — Ambulatory Visit
Admission: RE | Admit: 2023-05-08 | Discharge: 2023-05-08 | Disposition: A | Discharge status: Home / Self Care | Payer: 59 | Source: Ambulatory Visit | Attending: Radiation Oncology | Admitting: Radiation Oncology

## 2023-05-08 DIAGNOSIS — Z5111 Encounter for antineoplastic chemotherapy: Secondary | ICD-10-CM | POA: Diagnosis not present

## 2023-05-08 LAB — RAD ONC ARIA SESSION SUMMARY
Course Elapsed Days: 17
Plan Fractions Treated to Date: 14
Plan Prescribed Dose Per Fraction: 1.8 Gy
Plan Total Fractions Prescribed: 25
Plan Total Prescribed Dose: 45 Gy
Reference Point Dosage Given to Date: 25.2 Gy
Reference Point Session Dosage Given: 1.8 Gy
Session Number: 14

## 2023-05-09 ENCOUNTER — Inpatient Hospital Stay: Payer: 59 | Admitting: Oncology

## 2023-05-09 ENCOUNTER — Inpatient Hospital Stay: Payer: 59

## 2023-05-09 ENCOUNTER — Ambulatory Visit
Admission: RE | Admit: 2023-05-09 | Discharge: 2023-05-09 | Disposition: A | Discharge status: Home / Self Care | Payer: 59 | Source: Ambulatory Visit | Attending: Radiation Oncology | Admitting: Radiation Oncology

## 2023-05-09 ENCOUNTER — Encounter: Payer: Self-pay | Admitting: Oncology

## 2023-05-09 ENCOUNTER — Other Ambulatory Visit: Payer: Self-pay

## 2023-05-09 VITALS — BP 129/76 | HR 72 | Temp 97.8°F | Resp 18 | Ht 70.0 in | Wt 182.0 lb

## 2023-05-09 DIAGNOSIS — T451X5A Adverse effect of antineoplastic and immunosuppressive drugs, initial encounter: Secondary | ICD-10-CM

## 2023-05-09 DIAGNOSIS — Z95828 Presence of other vascular implants and grafts: Secondary | ICD-10-CM

## 2023-05-09 DIAGNOSIS — C155 Malignant neoplasm of lower third of esophagus: Secondary | ICD-10-CM | POA: Diagnosis not present

## 2023-05-09 DIAGNOSIS — N1831 Chronic kidney disease, stage 3a: Secondary | ICD-10-CM | POA: Diagnosis not present

## 2023-05-09 DIAGNOSIS — D472 Monoclonal gammopathy: Secondary | ICD-10-CM

## 2023-05-09 DIAGNOSIS — D701 Agranulocytosis secondary to cancer chemotherapy: Secondary | ICD-10-CM

## 2023-05-09 DIAGNOSIS — Z5111 Encounter for antineoplastic chemotherapy: Secondary | ICD-10-CM | POA: Diagnosis not present

## 2023-05-09 LAB — CBC WITH DIFFERENTIAL (CANCER CENTER ONLY)
Abs Immature Granulocytes: 0.01 10*3/uL (ref 0.00–0.07)
Basophils Absolute: 0.1 10*3/uL (ref 0.0–0.1)
Basophils Relative: 2 %
Eosinophils Absolute: 0.1 10*3/uL (ref 0.0–0.5)
Eosinophils Relative: 4 %
HCT: 37.8 % — ABNORMAL LOW (ref 39.0–52.0)
Hemoglobin: 12.8 g/dL — ABNORMAL LOW (ref 13.0–17.0)
Immature Granulocytes: 0 %
Lymphocytes Relative: 15 %
Lymphs Abs: 0.4 10*3/uL — ABNORMAL LOW (ref 0.7–4.0)
MCH: 29.2 pg (ref 26.0–34.0)
MCHC: 33.9 g/dL (ref 30.0–36.0)
MCV: 86.1 fL (ref 80.0–100.0)
Monocytes Absolute: 0.3 10*3/uL (ref 0.1–1.0)
Monocytes Relative: 10 %
Neutro Abs: 1.8 10*3/uL (ref 1.7–7.7)
Neutrophils Relative %: 69 %
Platelet Count: 160 10*3/uL (ref 150–400)
RBC: 4.39 MIL/uL (ref 4.22–5.81)
RDW: 14.3 % (ref 11.5–15.5)
WBC Count: 2.6 10*3/uL — ABNORMAL LOW (ref 4.0–10.5)
nRBC: 0 % (ref 0.0–0.2)

## 2023-05-09 LAB — CMP (CANCER CENTER ONLY)
ALT: 13 U/L (ref 0–44)
AST: 14 U/L — ABNORMAL LOW (ref 15–41)
Albumin: 3.9 g/dL (ref 3.5–5.0)
Alkaline Phosphatase: 84 U/L (ref 38–126)
Anion gap: 5 (ref 5–15)
BUN: 20 mg/dL (ref 8–23)
CO2: 27 mmol/L (ref 22–32)
Calcium: 8.9 mg/dL (ref 8.9–10.3)
Chloride: 107 mmol/L (ref 98–111)
Creatinine: 1.24 mg/dL (ref 0.61–1.24)
GFR, Estimated: >60 mL/min (ref >60)
Glucose, Bld: 273 mg/dL — ABNORMAL HIGH (ref 70–99)
Potassium: 4.4 mmol/L (ref 3.5–5.1)
Sodium: 139 mmol/L (ref 135–145)
Total Bilirubin: 0.3 mg/dL (ref <1.2)
Total Protein: 6.2 g/dL — ABNORMAL LOW (ref 6.5–8.1)

## 2023-05-09 LAB — RAD ONC ARIA SESSION SUMMARY
Course Elapsed Days: 18
Plan Fractions Treated to Date: 15
Plan Prescribed Dose Per Fraction: 1.8 Gy
Plan Total Fractions Prescribed: 25
Plan Total Prescribed Dose: 45 Gy
Reference Point Dosage Given to Date: 27 Gy
Reference Point Session Dosage Given: 1.8 Gy
Session Number: 15

## 2023-05-09 MED ORDER — HEPARIN SOD (PORK) LOCK FLUSH 100 UNIT/ML IV SOLN
500.0000 [IU] | Freq: Once | INTRAVENOUS | Status: AC | PRN
  Administered 2023-05-09: 500 [IU]

## 2023-05-09 MED ORDER — PACLITAXEL CHEMO INJECTION 300 MG/50ML
50.0000 mg/m2 | Freq: Once | INTRAVENOUS | Status: AC
  Administered 2023-05-09: 102 mg via INTRAVENOUS
  Filled 2023-05-09: qty 17

## 2023-05-09 MED ORDER — FAMOTIDINE IN NACL 20-0.9 MG/50ML-% IV SOLN
20.0000 mg | Freq: Once | INTRAVENOUS | Status: AC
  Administered 2023-05-09: 20 mg via INTRAVENOUS
  Filled 2023-05-09: qty 50

## 2023-05-09 MED ORDER — DEXAMETHASONE SODIUM PHOSPHATE 10 MG/ML IJ SOLN
10.0000 mg | Freq: Once | INTRAMUSCULAR | Status: AC
  Administered 2023-05-09: 10 mg via INTRAVENOUS
  Filled 2023-05-09: qty 1

## 2023-05-09 MED ORDER — SODIUM CHLORIDE 0.9% FLUSH
10.0000 mL | INTRAVENOUS | Status: DC | PRN
  Administered 2023-05-09: 10 mL

## 2023-05-09 MED ORDER — PALONOSETRON HCL INJECTION 0.25 MG/5ML
0.2500 mg | Freq: Once | INTRAVENOUS | Status: AC
Start: 2023-05-09 — End: 2023-05-09
  Administered 2023-05-09: 0.25 mg via INTRAVENOUS
  Filled 2023-05-09: qty 5

## 2023-05-09 MED ORDER — SODIUM CHLORIDE 0.9 % IV SOLN: INTRAVENOUS | Status: DC

## 2023-05-09 MED ORDER — DIPHENHYDRAMINE HCL 50 MG/ML IJ SOLN
50.0000 mg | Freq: Once | INTRAMUSCULAR | Status: AC
  Administered 2023-05-09: 50 mg via INTRAVENOUS
  Filled 2023-05-09: qty 1

## 2023-05-09 MED ORDER — SODIUM CHLORIDE 0.9% FLUSH
10.0000 mL | Freq: Once | INTRAVENOUS | Status: AC
  Administered 2023-05-09: 10 mL

## 2023-05-09 MED ORDER — CARBOPLATIN CHEMO INJECTION 450 MG/45ML
141.7500 mg | Freq: Once | INTRAVENOUS | Status: AC
  Administered 2023-05-09: 140 mg via INTRAVENOUS
  Filled 2023-05-09: qty 14

## 2023-05-09 NOTE — Assessment & Plan Note (Addendum)
History of kidney issues related to diabetes, creatinine has improved at 1.24 today.  Patient seeing a kidney specialist. -Monitor kidney function during chemotherapy, ensure patient stays hydrated.  He was advised to drink at least 80 ounces of water a day.

## 2023-05-09 NOTE — Patient Instructions (Signed)

## 2023-05-09 NOTE — Assessment & Plan Note (Addendum)
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  Concern for lymph node involvement based on CT scan.  Significant weight loss (40-50 lbs) over the past 9-10 months.   - Previously I discussed tentative staging, prognosis, plan of care, treatment options with the patient and his wife.  Reviewed NCCN guidelines.  Explained to them that treatment of esophageal cancer involves multidisciplinary approach including cardiothoracic surgery, medical oncology, radiation oncology, gastroenterology.    -I discussed his case with Dr. Jennye Boroughs previously.  Since they do not perform EUS, referral sent to local GI Dr. Jeani Hawking for EUS to help with staging. On 04/04/2023, Dr. Elnoria Howard performed upper EUS.  It was at least T3, N 1/2, MX by endosonographic criteria.   -Staging PET scan on 04/07/2023 showed distal esophageal/GE junction adenocarcinoma.  Lower paraesophageal nodal metastasis.  No evidence of distant metastatic disease.  -Referral sent to cardiothoracic surgeon Dr. Cliffton Asters and to Dr. Mitzi Hansen, radiation oncologist for concurrent chemoradiation planning in the neoadjuvant setting.  -Discussed different treatment options.  Discussed risks versus benefits with each modality/regimen. Plan made to proceed with chemotherapy with carboplatin and paclitaxel, once weekly, in conjunction with radiation therapy.  -He started radiation treatments under the direction of Dr. Mitzi Hansen from 04/21/2023.  He started chemotherapy with carboplatin and paclitaxel from 04/25/2023.    -Due for dose # 3 of carboplatin and paclitaxel today.  Labs reveal leukopenia with white count of 2600 but ANC is 1800.  No other dose-limiting toxicities.  Will proceed with cycle # 3 of carboplatin and paclitaxel today and continue this weekly.  We will arrange for Neupogen 300 mcg subcutaneously daily x 2 doses next week.   -Ordered additional testing on biopsy specimen for potential targeted therapy options  including MMR testing, PD-L1 testing  -Plan for potential surgery following chemoradiation, pending response assessment to treatment.  -He was advised to continue omeprazole daily.

## 2023-05-09 NOTE — Progress Notes (Signed)
[facility]  ONCOLOGY CLINIC PROGRESS NOTE   Patient Name:  Seth Burch  MR # [id number]  DOB: 1955/09/26   Patient Care Team: Shelle Iron, MD as PCP - General (Family Medicine) Misenheimer, Marcial Pacas, MD as Consulting Physician (Gastroenterology) Meryl Crutch, MD as Consulting Physician (Hematology and Oncology)  Date of visit: 05/09/2023   ASSESSMENT & PLAN:   67 y.o. 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  Concern for lymph node involvement based on CT scan.  Significant weight loss (40-50 lbs) over the past 9-10 months.   - Previously I discussed tentative staging, prognosis, plan of care, treatment options with the patient and his wife.  Reviewed NCCN guidelines.  Explained to them that treatment of esophageal cancer involves multidisciplinary approach including cardiothoracic surgery, medical oncology, radiation oncology, gastroenterology.    -I discussed his case with Dr. Jennye Boroughs previously.  Since they do not perform EUS, referral sent to local GI Dr. Jeani Hawking for EUS to help with staging. On 04/04/2023, Dr. Elnoria Howard performed upper EUS.  It was at least T3, N 1/2, MX by endosonographic criteria.   -Staging PET scan on 04/07/2023 showed distal esophageal/GE junction adenocarcinoma.  Lower paraesophageal nodal metastasis.  No evidence of distant metastatic disease.  -Referral sent to cardiothoracic surgeon Dr. Cliffton Asters and to Dr. Mitzi Hansen, radiation oncologist for concurrent chemoradiation planning in the neoadjuvant setting.  -Discussed different treatment options.  Discussed risks versus benefits with each modality/regimen. Plan made  to proceed with chemotherapy with carboplatin and paclitaxel, once weekly, in conjunction with radiation therapy.  -He started radiation treatments under the direction of Dr. Mitzi Hansen from 04/21/2023.  He started chemotherapy with carboplatin and paclitaxel from 04/25/2023.    -Due for dose # 3 of carboplatin and paclitaxel today.  Labs reveal leukopenia with white count of 2600 but ANC is 1800.  No other dose-limiting toxicities.  Will proceed with cycle # 3 of carboplatin and paclitaxel today and continue this weekly.  We will arrange for Neupogen 300 mcg subcutaneously daily x 2 doses next week.   -Ordered additional testing on biopsy specimen for potential targeted therapy options including MMR testing, PD-L1 testing  -Plan for potential surgery following chemoradiation, pending response assessment to treatment.  -He was advised to continue omeprazole daily.  Leukopenia due to antineoplastic chemotherapy (HCC) - White count 2600 but ANC is 1800.  Will proceed with chemotherapy today but dose reduced carboplatin to AUC 1.5.  Will also arrange Neupogen daily x 2 doses next week.    -Will continue to monitor labs each week and defer treatment if ANC is below 1000.  CKD (chronic kidney disease) stage 3, GFR 30-59 ml/min (HCC) History of kidney issues related to diabetes, creatinine has improved at 1.24 today.  Patient seeing a kidney specialist. -Monitor kidney function during chemotherapy, ensure patient stays hydrated.  He was advised to drink at least 80 ounces of water a day.  MGUS (monoclonal gammopathy of unknown significance) -He has been on surveillance for IgG kappa MGUS. [name]  Repeat myeloma labs from 03/27/2023 showed M protein of 0.6 g/dL, stable.  IgG kappa type on IFE.  Kappa/lambda light chain ratio stable at 5.24.  Overall picture consistent with MGUS.  Will continue  surveillance alone.     I will see him in 1 week for follow-up with repeat labs and for cycle 4 of chemotherapy.  He  was advised to maintain adequate hydration to preserve his renal function.  I reviewed lab results and outside records for this visit and discussed relevant results with the patient. Diagnosis, plan of care and treatment options were also discussed in detail with the patient. Opportunity provided to ask questions and answers provided to his apparent satisfaction. Provided instructions to call our clinic with any problems, questions or concerns prior to return visit. I recommended to continue follow-up with PCP and sub-specialists. He verbalized understanding and agreed with the plan.   NCCN guidelines have been consulted in the planning of this patient's care.  I provided 30 minutes of face-to-face time during this encounter and > 50% was spent counseling as documented under my assessment and plan.    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  Radiation treatment started from 04/21/2023.  Chemotherapy started from 04/25/2023.  INTERVAL HISTORY:    Discussed the use of AI scribe software for clinical note transcription with the patient, who gave verbal consent to proceed.   Seth Burch is here today for repeat clinical assessment.   He reports intermittent chest discomfort occurring two to three times a week, predominantly at night when lying down. The discomfort is described as starting in a specific area and running in a line. The patient denies any relation to food intake. Additionally, the patient experiences neck discomfort, which can be alleviated by stretching the neck. The  patient reports improved swallowing ability and has gained weight. The patient denies any issues with nausea, vomiting, or sores in the mouth. The patient also denies any issues with blood in stools or black stools.  I have reviewed the past medical history, past surgical history, social history and family history with the patient and they are unchanged from previous note.  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 Jennye Boroughs after he presented to his PCP with complaints of dysphagia and weight loss.  He started noticing difficulty in swallowing in January or February 2024.  He also reported unintentional weight loss of about 20 pounds in 1 year.  Previously he used to take ranitidine for intermittent acid reflux but it was stopped approximately in 2018.  He was started on omeprazole and an EGD was planned for further evaluation.  Also screening colonoscopy was recommended since last colonoscopy was in 2012.   03/11/2023 Pathology Results   Biopsy of stomach/gastric cardia: Cardiac mucosa showing at least intramucosal adenocarcinoma.  Biopsy of esophageal mass: Invasive, moderately differentiated adenocarcinoma.   03/11/2023 Procedure   EGD with biopsy by Dr.Misenheimer: Large mass in the distal esophagus between 30 and 38 cm.  Probable infiltration of the mass in the gastric cardia.  Colonoscopy on 03/11/2023: Mild diverticular disease in the left colon.   03/14/2023 Imaging   CT chest,  abdomen and pelvis: Circumferential, masslike thickening in the lower third of esophagus, 7 cm in length, consistent with known primary esophageal malignancy.  Enlarged right paraesophageal lymph node measuring 1.2 x 0.8 cm, consistent with nodal metastatic disease.  No other evidence of lymphadenopathy or  metastatic disease in the chest, abdomen or pelvis.  Background of very fine centrilobular pulmonary nodules, most concentrated in the lung apices as well as mild diffuse bronchial wall thickening, most commonly seen in smoking-related respiratory bronchiolitis.  Prostatomegaly.   04/04/2023 Procedure   EUS by Dr.Hung: cT3 or cT4, N1/N2,Mx disease.   04/07/2023 PET scan   PET scan: distal esophageal/GE junction adenocarcinoma.  Lower paraesophageal nodal metastasis.  No evidence of distant metastatic disease.   04/24/2023 Cancer Staging   Staging form: Esophagus - Adenocarcinoma, AJCC 8th Edition - Clinical: Stage III (cT3, cN1, cM0, G2) - Signed by Meryl Crutch, MD on 04/24/2023 Histologic grading system: 3 grade system   04/25/2023 -  Chemotherapy   Patient is on Treatment Plan : ESOPHAGUS Carboplatin + Paclitaxel Weekly X 6 Weeks with XRT         REVIEW OF SYSTEMS:   Review of Systems - Oncology  All other pertinent systems were reviewed with the patient and are negative.  ALLERGIES: He is allergic to no known allergies.  MEDICATIONS:  Current Outpatient Medications  Medication Sig Dispense Refill   ACCU-CHEK GUIDE test strip 1 each by Other route 2 (two) times daily.     amLODipine (NORVASC) 5 MG tablet Take 1 tablet by mouth daily.     Aspirin 81 MG CAPS Take 81 mg by mouth daily.     atorvastatin (LIPITOR) 20 MG tablet Take 1 tablet by mouth daily.     dexamethasone (DECADRON) 4 MG tablet Take 2 tablets daily for 2 days, start the day after chemotherapy. Take with food. 30 tablet 1   JARDIANCE 10 MG TABS tablet Take 1 tablet by mouth daily.     LANTUS SOLOSTAR 100 UNIT/ML Solostar Pen Inject 26 Units into the skin daily.     lidocaine-prilocaine (EMLA) cream Apply to affected area once 30 g 3   metFORMIN (GLUCOPHAGE) 500 MG tablet Take 2 tablets by mouth daily.     omeprazole (PRILOSEC) 40 MG capsule Take 40 mg by mouth daily.     ondansetron (ZOFRAN) 8 MG tablet Take 1  tablet (8 mg total) by mouth every 8 (eight) hours as needed for nausea or vomiting. Start on the third day after chemotherapy. 30 tablet 1   prochlorperazine (COMPAZINE) 10 MG tablet Take 1 tablet (10 mg total) by mouth every 6 (six) hours as needed for nausea or vomiting. 30 tablet 1   ramipril (ALTACE) 10 MG capsule Take 2 capsules by mouth daily.     tamsulosin (FLOMAX) 0.4 MG CAPS capsule Take 1 capsule by mouth daily.     No current facility-administered medications for this visit.     VITALS:   Blood pressure 129/76, pulse 72, temperature 97.8 F (36.6 C), temperature source Temporal, resp. rate 18, height 5\' 10"  (1.778 m), weight 182 lb (82.6 kg), SpO2 100%.  Wt Readings from Last 3 Encounters:  05/09/23 182 lb (82.6 kg)  04/25/23 181 lb (82.1 kg)  04/25/23 180 lb 8 oz (81.9 kg)    Body mass index is 26.11 kg/m.  Performance status (ECOG): 1 - Symptomatic but completely ambulatory  PHYSICAL EXAM:   Physical Exam Constitutional:      General: He is not in  acute distress.    Appearance: Normal appearance.  HENT:     Head: Normocephalic and atraumatic.  Eyes:     General: No scleral icterus.    Conjunctiva/sclera: Conjunctivae normal.  Cardiovascular:     Rate and Rhythm: Normal rate and regular rhythm.     Heart sounds: Normal heart sounds.  Pulmonary:     Effort: Pulmonary effort is normal.     Breath sounds: Normal breath sounds.  Chest:     Comments: Right-sided Port-A-Cath in place without any signs of infection Abdominal:     General: There is no distension.  Musculoskeletal:     Right lower leg: No edema.     Left lower leg: No edema.  Neurological:     General: No focal deficit present.     Mental Status: He is alert and oriented to person, place, and time.  Psychiatric:        Mood and Affect: Mood normal.        Behavior: Behavior normal.        Thought Content: Thought content normal.      LABORATORY DATA:   I have reviewed the data as  listed.   Results for orders placed or performed in visit on 05/09/23 (from the past 72 hours)  CBC with Differential (Cancer Center Only)     Status: Abnormal   Collection Time: 05/09/23 10:34 AM  Result Value Ref Range   WBC Count 2.6 (L) 4.0 - 10.5 K/uL   RBC 4.39 4.22 - 5.81 MIL/uL   Hemoglobin 12.8 (L) 13.0 - 17.0 g/dL   HCT 16.1 (L) 09.6 - 04.5 %   MCV 86.1 80.0 - 100.0 fL   MCH 29.2 26.0 - 34.0 pg   MCHC 33.9 30.0 - 36.0 g/dL   RDW 40.9 81.1 - 91.4 %   Platelet Count 160 150 - 400 K/uL   nRBC 0.0 0.0 - 0.2 %   Neutrophils Relative % 69 %   Neutro Abs 1.8 1.7 - 7.7 K/uL   Lymphocytes Relative 15 %   Lymphs Abs 0.4 (L) 0.7 - 4.0 K/uL   Monocytes Relative 10 %   Monocytes Absolute 0.3 0.1 - 1.0 K/uL   Eosinophils Relative 4 %   Eosinophils Absolute 0.1 0.0 - 0.5 K/uL   Basophils Relative 2 %   Basophils Absolute 0.1 0.0 - 0.1 K/uL   Immature Granulocytes 0 %   Abs Immature Granulocytes 0.01 0.00 - 0.07 K/uL    Comment: Performed at [facility], [address] [place], [place] [address]  CMP (Cancer Center only)     Status: Abnormal   Collection Time: 05/09/23 10:34 AM  Result Value Ref Range   Sodium 139 135 - 145 mmol/L   Potassium 4.4 3.5 - 5.1 mmol/L   Chloride 107 98 - 111 mmol/L   CO2 27 22 - 32 mmol/L   Glucose, Bld 273 (H) 70 - 99 mg/dL    Comment: Glucose reference range applies only to samples taken after fasting for at least 8 hours.   BUN 20 8 - 23 mg/dL   Creatinine 6.21 3.08 - 1.24 mg/dL   Calcium 8.9 8.9 - 65.7 mg/dL   Total Protein 6.2 (L) 6.5 - 8.1 g/dL   Albumin 3.9 3.5 - 5.0 g/dL   AST 14 (L) 15 - 41 U/L   ALT 13 0 - 44 U/L   Alkaline Phosphatase 84 38 - 126 U/L   Total Bilirubin 0.3 <1.2 mg/dL  GFR, Estimated >60 >60 mL/min    Comment: (NOTE) Calculated using the CKD-EPI Creatinine Equation (2021)    Anion gap 5 5 - 15    Comment: Performed at [facility], [address],  [place], [place] [address]     RADIOGRAPHIC STUDIES:  I have personally reviewed the radiological images as listed and agree with the findings in the report.  No results found.   CODE STATUS:   No orders of the defined types were placed in this encounter.    Future Appointments  Date Time Provider Department Center  05/09/2023  4:00 PM [facility] [id number] [facility] None  05/09/2023  4:15 PM LINAC-MOODY [facility] None  05/12/2023  3:00 PM [facility] FLUSH [facility] None  05/12/2023  3:45 PM [facility] [id number] [facility] None  05/13/2023 12:00 PM [facility] FLUSH [facility] None  05/13/2023  1:00 PM [facility] [id number] [facility] None  05/15/2023  3:45 PM [facility] [id number] [facility] None  05/16/2023 11:00 AM [facility] FLUSH [facility] None  05/16/2023 11:20 AM Patrese Neal, MD [facility] None  05/16/2023 12:00 PM [facility] INFUSION [facility] None  05/16/2023  3:45 PM [facility] [id number] [facility] None  05/19/2023  3:45 PM [facility] [id number] [facility] None  05/20/2023  1:30 PM [facility] [id number] [facility] None  05/22/2023 11:15 AM [facility] FLUSH [facility] None  05/22/2023 11:40 AM Adaysha Dubinsky, MD [facility] None  05/22/2023 12:30 PM [facility] INFUSION [facility] None  05/22/2023  4:15 PM [facility] [id number] [facility] None  05/23/2023  3:45 PM [facility] [id number] [facility] None  05/26/2023  3:45 PM [facility] [id number] [facility] None  05/27/2023  3:45 PM [facility] [id number] [facility] None  05/28/2023  3:45 PM [facility] [id number] [facility] None  05/29/2023  3:45 PM [facility] [id number] [facility] None  05/30/2023 11:15 AM [facility] FLUSH [facility] None  05/30/2023 11:40 AM Nikita Humble, MD [facility] None  05/30/2023 12:30 PM [facility] INFUSION [facility] None  05/30/2023  4:15 PM [facility] LINAC 3 [facility] None  06/12/2023  2:00 PM Bonnita Levan, RD [facility] NDM  06/13/2023  2:00 PM Lightfoot, Eliezer Lofts, MD  [facility] TCTSG      This document was completed utilizing speech recognition software. Grammatical errors, random word insertions, pronoun errors, and incomplete sentences are an occasional consequence of this system due to software limitations, ambient noise, and hardware issues. Any formal questions or concerns about the content, text or information contained within the body of this dictation should be directly addressed to the provider for clarification.

## 2023-05-09 NOTE — Assessment & Plan Note (Signed)
-  He has been on surveillance for IgG kappa MGUS. Seth Burch  Repeat myeloma labs from 03/27/2023 showed M protein of 0.6 g/dL, stable.  IgG kappa type on IFE.  Kappa/lambda light chain ratio stable at 5.24.  Overall picture consistent with MGUS.  Will continue surveillance alone.

## 2023-05-09 NOTE — Assessment & Plan Note (Signed)
-   White count 2600 but ANC is 1800.  Will proceed with chemotherapy today but dose reduced carboplatin to AUC 1.5.  Will also arrange Neupogen daily x 2 doses next week.    -Will continue to monitor labs each week and defer treatment if ANC is below 1000.

## 2023-05-12 ENCOUNTER — Encounter: Payer: Self-pay | Admitting: Oncology

## 2023-05-12 ENCOUNTER — Ambulatory Visit
Admission: RE | Admit: 2023-05-12 | Discharge: 2023-05-12 | Disposition: A | Discharge status: Home / Self Care | Payer: 59 | Source: Ambulatory Visit | Attending: Radiation Oncology | Admitting: Radiation Oncology

## 2023-05-12 ENCOUNTER — Inpatient Hospital Stay: Payer: 59

## 2023-05-12 ENCOUNTER — Other Ambulatory Visit: Payer: Self-pay

## 2023-05-12 VITALS — BP 131/83 | HR 76 | Temp 97.9°F | Resp 18

## 2023-05-12 DIAGNOSIS — Z95828 Presence of other vascular implants and grafts: Secondary | ICD-10-CM

## 2023-05-12 DIAGNOSIS — Z5111 Encounter for antineoplastic chemotherapy: Secondary | ICD-10-CM | POA: Diagnosis not present

## 2023-05-12 LAB — RAD ONC ARIA SESSION SUMMARY
Course Elapsed Days: 21
Plan Fractions Treated to Date: 16
Plan Prescribed Dose Per Fraction: 1.8 Gy
Plan Total Fractions Prescribed: 25
Plan Total Prescribed Dose: 45 Gy
Reference Point Dosage Given to Date: 28.8 Gy
Reference Point Session Dosage Given: 1.8 Gy
Session Number: 16

## 2023-05-12 MED ORDER — FILGRASTIM-SNDZ 300 MCG/0.5ML IJ SOSY
300.0000 ug | PREFILLED_SYRINGE | Freq: Once | INTRAMUSCULAR | Status: AC
  Administered 2023-05-12: 300 ug via SUBCUTANEOUS
  Filled 2023-05-12: qty 0.5

## 2023-05-12 NOTE — Patient Instructions (Signed)
Filgrastim Injection What is this medication? FILGRASTIM (fil GRA stim) lowers the risk of infection in people who are receiving chemotherapy. It works by helping your body make more white blood cells, which protects your body from infection. It may also be used to help people who have been exposed to high doses of radiation. It can be used to help prepare your body before a stem cell transplant. It works by helping your bone marrow make and release stem cells into the blood. This medicine may be used for other purposes; ask your health care provider or pharmacist if you have questions. COMMON BRAND NAME(S): Neupogen, Nivestym, Releuko, Zarxio What should I tell my care team before I take this medication? They need to know if you have any of these conditions: History of blood diseases, such as sickle cell anemia Kidney disease Recent or ongoing radiation An unusual or allergic reaction to filgrastim, pegfilgrastim, latex, rubber, other medications, foods, dyes, or preservatives Pregnant or trying to get pregnant Breast-feeding How should I use this medication? This medication is injected under the skin or into a vein. It is usually given by your care team in a hospital or clinic setting. It may be given at home. If you get this medication at home, you will be taught how to prepare and give it. Use exactly as directed. Take it as directed on the prescription label at the same time every day. Keep taking it unless your care team tells you to stop. It is important that you put your used needles and syringes in a special sharps container. Do not put them in a trash can. If you do not have a sharps container, call your pharmacist or care team to get one. This medication comes with INSTRUCTIONS FOR USE. Ask your pharmacist for directions on how to use this medication. Read the information carefully. Talk to your pharmacist or care team if you have questions. Talk to your care team about the use of this  medication in children. While it may be prescribed for children for selected conditions, precautions do apply. Overdosage: If you think you have taken too much of this medicine contact a poison control center or emergency room at once. NOTE: This medicine is only for you. Do not share this medicine with others. What if I miss a dose? It is important not to miss any doses. Talk to your care team about what to do if you miss a dose. What may interact with this medication? Medications that may cause a release of neutrophils, such as lithium This list may not describe all possible interactions. Give your health care provider a list of all the medicines, herbs, non-prescription drugs, or dietary supplements you use. Also tell them if you smoke, drink alcohol, or use illegal drugs. Some items may interact with your medicine. What should I watch for while using this medication? Your condition will be monitored carefully while you are receiving this medication. You may need bloodwork while taking this medication. Talk to your care team about your risk of cancer. You may be more at risk for certain types of cancer if you take this medication. What side effects may I notice from receiving this medication? Side effects that you should report to your care team as soon as possible: Allergic reactions--skin rash, itching, hives, swelling of the face, lips, tongue, or throat Capillary leak syndrome--stomach or muscle pain, unusual weakness or fatigue, feeling faint or lightheaded, decrease in the amount of urine, swelling of the ankles, hands, or   feet, trouble breathing High white blood cell level--fever, fatigue, trouble breathing, night sweats, change in vision, weight loss Inflammation of the aorta--fever, fatigue, back, chest, or stomach pain, severe headache Kidney injury (glomerulonephritis)--decrease in the amount of urine, red or dark brown urine, foamy or bubbly urine, swelling of the ankles, hands, or  feet Shortness of breath or trouble breathing Spleen injury--pain in upper left stomach or shoulder Unusual bruising or bleeding Side effects that usually do not require medical attention (report to your care team if they continue or are bothersome): Back pain Bone pain Fatigue Fever Headache Nausea This list may not describe all possible side effects. Call your doctor for medical advice about side effects. You may report side effects to FDA at 1-800-FDA-1088. Where should I keep my medication? Keep out of the reach of children and pets. Keep this medication in the original packaging until you are ready to take it. Protect from light. See product for storage information. Each product may have different instructions. Get rid of any unused medication after the expiration date. To get rid of medications that are no longer needed or have expired: Take the medication to a medications take-back program. Check with your pharmacy or law enforcement to find a location. If you cannot return the medication, ask your pharmacist or care team how to get rid of this medication safely. NOTE: This sheet is a summary. It may not cover all possible information. If you have questions about this medicine, talk to your doctor, pharmacist, or health care provider.  2024 Elsevier/Gold Standard (2021-09-27 00:00:00)  

## 2023-05-13 ENCOUNTER — Ambulatory Visit
Admission: RE | Admit: 2023-05-13 | Discharge: 2023-05-13 | Disposition: A | Discharge status: Home / Self Care | Payer: 59 | Source: Ambulatory Visit | Attending: Radiation Oncology

## 2023-05-13 ENCOUNTER — Inpatient Hospital Stay: Payer: 59

## 2023-05-13 ENCOUNTER — Other Ambulatory Visit: Payer: Self-pay

## 2023-05-13 VITALS — BP 135/84 | HR 76 | Temp 97.9°F | Resp 18 | Wt 178.4 lb

## 2023-05-13 DIAGNOSIS — Z95828 Presence of other vascular implants and grafts: Secondary | ICD-10-CM

## 2023-05-13 DIAGNOSIS — Z5111 Encounter for antineoplastic chemotherapy: Secondary | ICD-10-CM | POA: Diagnosis not present

## 2023-05-13 LAB — RAD ONC ARIA SESSION SUMMARY
Course Elapsed Days: 22
Plan Fractions Treated to Date: 17
Plan Prescribed Dose Per Fraction: 1.8 Gy
Plan Total Fractions Prescribed: 25
Plan Total Prescribed Dose: 45 Gy
Reference Point Dosage Given to Date: 30.6 Gy
Reference Point Session Dosage Given: 1.8 Gy
Session Number: 17

## 2023-05-13 MED ORDER — FILGRASTIM-SNDZ 300 MCG/0.5ML IJ SOSY
300.0000 ug | PREFILLED_SYRINGE | Freq: Every day | INTRAMUSCULAR | Status: DC
Start: 2023-05-13 — End: 2023-05-13
  Administered 2023-05-13: 300 ug via SUBCUTANEOUS

## 2023-05-15 ENCOUNTER — Other Ambulatory Visit: Payer: Self-pay

## 2023-05-15 ENCOUNTER — Ambulatory Visit
Admission: RE | Admit: 2023-05-15 | Discharge: 2023-05-15 | Disposition: A | Discharge status: Home / Self Care | Payer: 59 | Source: Ambulatory Visit | Attending: Radiation Oncology | Admitting: Radiation Oncology

## 2023-05-15 DIAGNOSIS — Z5111 Encounter for antineoplastic chemotherapy: Secondary | ICD-10-CM | POA: Diagnosis not present

## 2023-05-15 LAB — RAD ONC ARIA SESSION SUMMARY
Course Elapsed Days: 24
Plan Fractions Treated to Date: 18
Plan Prescribed Dose Per Fraction: 1.8 Gy
Plan Total Fractions Prescribed: 25
Plan Total Prescribed Dose: 45 Gy
Reference Point Dosage Given to Date: 32.4 Gy
Reference Point Session Dosage Given: 1.8 Gy
Session Number: 18

## 2023-05-16 ENCOUNTER — Inpatient Hospital Stay: Payer: 59

## 2023-05-16 ENCOUNTER — Other Ambulatory Visit: Payer: Self-pay

## 2023-05-16 ENCOUNTER — Ambulatory Visit
Admission: RE | Admit: 2023-05-16 | Discharge: 2023-05-16 | Disposition: A | Discharge status: Home / Self Care | Payer: 59 | Source: Ambulatory Visit | Attending: Radiation Oncology | Admitting: Radiation Oncology

## 2023-05-16 ENCOUNTER — Encounter: Payer: Self-pay | Admitting: Oncology

## 2023-05-16 ENCOUNTER — Inpatient Hospital Stay: Payer: 59 | Admitting: Oncology

## 2023-05-16 VITALS — BP 133/84 | HR 89 | Temp 97.3°F | Resp 18 | Wt 180.9 lb

## 2023-05-16 DIAGNOSIS — C155 Malignant neoplasm of lower third of esophagus: Secondary | ICD-10-CM

## 2023-05-16 DIAGNOSIS — D701 Agranulocytosis secondary to cancer chemotherapy: Secondary | ICD-10-CM | POA: Diagnosis not present

## 2023-05-16 DIAGNOSIS — T451X5A Adverse effect of antineoplastic and immunosuppressive drugs, initial encounter: Secondary | ICD-10-CM

## 2023-05-16 DIAGNOSIS — D472 Monoclonal gammopathy: Secondary | ICD-10-CM

## 2023-05-16 DIAGNOSIS — Z5111 Encounter for antineoplastic chemotherapy: Secondary | ICD-10-CM | POA: Diagnosis not present

## 2023-05-16 DIAGNOSIS — N1831 Chronic kidney disease, stage 3a: Secondary | ICD-10-CM

## 2023-05-16 DIAGNOSIS — Z95828 Presence of other vascular implants and grafts: Secondary | ICD-10-CM

## 2023-05-16 LAB — CBC WITH DIFFERENTIAL (CANCER CENTER ONLY)
Abs Immature Granulocytes: 0.06 10*3/uL (ref 0.00–0.07)
Basophils Absolute: 0.1 10*3/uL (ref 0.0–0.1)
Basophils Relative: 2 %
Eosinophils Absolute: 0.1 10*3/uL (ref 0.0–0.5)
Eosinophils Relative: 3 %
HCT: 37.6 % — ABNORMAL LOW (ref 39.0–52.0)
Hemoglobin: 12.7 g/dL — ABNORMAL LOW (ref 13.0–17.0)
Immature Granulocytes: 2 %
Lymphocytes Relative: 7 %
Lymphs Abs: 0.3 10*3/uL — ABNORMAL LOW (ref 0.7–4.0)
MCH: 29.3 pg (ref 26.0–34.0)
MCHC: 33.8 g/dL (ref 30.0–36.0)
MCV: 86.6 fL (ref 80.0–100.0)
Monocytes Absolute: 0.6 10*3/uL (ref 0.1–1.0)
Monocytes Relative: 14 %
Neutro Abs: 3 10*3/uL (ref 1.7–7.7)
Neutrophils Relative %: 72 %
Platelet Count: 129 10*3/uL — ABNORMAL LOW (ref 150–400)
RBC: 4.34 MIL/uL (ref 4.22–5.81)
RDW: 14.9 % (ref 11.5–15.5)
WBC Count: 4.1 10*3/uL (ref 4.0–10.5)
nRBC: 0 % (ref 0.0–0.2)

## 2023-05-16 LAB — CMP (CANCER CENTER ONLY)
ALT: 14 U/L (ref 0–44)
AST: 14 U/L — ABNORMAL LOW (ref 15–41)
Albumin: 4 g/dL (ref 3.5–5.0)
Alkaline Phosphatase: 91 U/L (ref 38–126)
Anion gap: 6 (ref 5–15)
BUN: 24 mg/dL — ABNORMAL HIGH (ref 8–23)
CO2: 26 mmol/L (ref 22–32)
Calcium: 9 mg/dL (ref 8.9–10.3)
Chloride: 105 mmol/L (ref 98–111)
Creatinine: 1.4 mg/dL — ABNORMAL HIGH (ref 0.61–1.24)
GFR, Estimated: 55 mL/min — ABNORMAL LOW (ref >60)
Glucose, Bld: 342 mg/dL — ABNORMAL HIGH (ref 70–99)
Potassium: 4.2 mmol/L (ref 3.5–5.1)
Sodium: 137 mmol/L (ref 135–145)
Total Bilirubin: 0.4 mg/dL (ref <1.2)
Total Protein: 6.6 g/dL (ref 6.5–8.1)

## 2023-05-16 LAB — RAD ONC ARIA SESSION SUMMARY
Course Elapsed Days: 25
Plan Fractions Treated to Date: 19
Plan Prescribed Dose Per Fraction: 1.8 Gy
Plan Total Fractions Prescribed: 25
Plan Total Prescribed Dose: 45 Gy
Reference Point Dosage Given to Date: 34.2 Gy
Reference Point Session Dosage Given: 1.8 Gy
Session Number: 19

## 2023-05-16 MED ORDER — SODIUM CHLORIDE 0.9 % IV SOLN
173.2000 mg | Freq: Once | INTRAVENOUS | Status: AC
  Administered 2023-05-16: 170 mg via INTRAVENOUS
  Filled 2023-05-16: qty 17

## 2023-05-16 MED ORDER — DIPHENHYDRAMINE HCL 50 MG/ML IJ SOLN
25.0000 mg | Freq: Once | INTRAMUSCULAR | Status: DC
Start: 2023-05-16 — End: 2023-05-16
  Filled 2023-05-16: qty 1

## 2023-05-16 MED ORDER — SODIUM CHLORIDE 0.9 % IV SOLN
INTRAVENOUS | Status: DC
Start: 2023-05-16 — End: 2023-05-16

## 2023-05-16 MED ORDER — HEPARIN SOD (PORK) LOCK FLUSH 100 UNIT/ML IV SOLN
500.0000 [IU] | Freq: Once | INTRAVENOUS | Status: AC | PRN
  Administered 2023-05-16: 500 [IU]

## 2023-05-16 MED ORDER — SODIUM CHLORIDE 0.9% FLUSH
10.0000 mL | Freq: Once | INTRAVENOUS | Status: AC
  Administered 2023-05-16: 10 mL

## 2023-05-16 MED ORDER — SODIUM CHLORIDE 0.9 % IV SOLN
50.0000 mg/m2 | Freq: Once | INTRAVENOUS | Status: AC
  Administered 2023-05-16: 102 mg via INTRAVENOUS
  Filled 2023-05-16: qty 17

## 2023-05-16 MED ORDER — PALONOSETRON HCL INJECTION 0.25 MG/5ML
0.2500 mg | Freq: Once | INTRAVENOUS | Status: AC
  Administered 2023-05-16: 0.25 mg via INTRAVENOUS
  Filled 2023-05-16: qty 5

## 2023-05-16 MED ORDER — FAMOTIDINE IN NACL 20-0.9 MG/50ML-% IV SOLN
20.0000 mg | Freq: Once | INTRAVENOUS | Status: AC
Start: 2023-05-16 — End: 2023-05-16
  Administered 2023-05-16: 20 mg via INTRAVENOUS
  Filled 2023-05-16: qty 50

## 2023-05-16 MED ORDER — CETIRIZINE HCL 10 MG/ML IV SOLN
10.0000 mg | Freq: Once | INTRAVENOUS | Status: AC
  Administered 2023-05-16: 10 mg via INTRAVENOUS
  Filled 2023-05-16: qty 1

## 2023-05-16 MED ORDER — SODIUM CHLORIDE 0.9% FLUSH
10.0000 mL | INTRAVENOUS | Status: DC | PRN
  Administered 2023-05-16: 10 mL

## 2023-05-16 MED ORDER — DEXAMETHASONE SODIUM PHOSPHATE 10 MG/ML IJ SOLN
10.0000 mg | Freq: Once | INTRAMUSCULAR | Status: AC
  Administered 2023-05-16: 10 mg via INTRAVENOUS
  Filled 2023-05-16: qty 1

## 2023-05-16 NOTE — Assessment & Plan Note (Signed)
History of kidney issues related to diabetes, creatinine is fluctuating and it is slightly worse at 1.4 today.  Patient seeing a kidney specialist. -Monitor kidney function during chemotherapy, ensure patient stays hydrated.  He was advised to drink at least 80 ounces of water a day.

## 2023-05-16 NOTE — Assessment & Plan Note (Signed)
-  He has been on surveillance for IgG kappa MGUS. [name]  Repeat myeloma labs from 03/27/2023 showed M protein of 0.6 g/dL, stable.  IgG kappa type on IFE.  Kappa/lambda light chain ratio stable at 5.24.  Overall picture consistent with MGUS.  Will continue surveillance alone.

## 2023-05-16 NOTE — Progress Notes (Signed)
Patient reports achy feeling, jumpy legs and an overall "hang-over" feeling following treatment suspect Benadryl. This RN offered to have antihistamine changed to cetirizine from benadryl for today and future treatment plans. MD approved change.

## 2023-05-16 NOTE — Patient Instructions (Signed)

## 2023-05-16 NOTE — Assessment & Plan Note (Addendum)
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  Concern for lymph node involvement based on CT scan.   - On 04/04/2023, Dr. Elnoria Howard performed upper EUS.  It was at least T3, N 1/2, MX by endosonographic criteria.   -Staging PET scan on 04/07/2023 showed distal esophageal/GE junction adenocarcinoma.  Lower paraesophageal nodal metastasis.  No evidence of distant metastatic disease.  -Referral sent to cardiothoracic surgeon Dr. Cliffton Asters and to Dr. Mitzi Hansen, radiation oncologist for concurrent chemoradiation planning in the neoadjuvant setting.  -He started radiation treatments under the direction of Dr. Mitzi Hansen from 04/21/2023.  He started chemotherapy with carboplatin and paclitaxel from 04/25/2023.    -Due for dose # 4 of carboplatin and paclitaxel today.  Labs reveal normal white count at 4100 today, ANC 3000.  Creatinine is 1.4 today, slightly worse compared to last week.  No other dose-limiting toxicities.  Will proceed with cycle # 4 of carboplatin and paclitaxel today and continue this weekly.  Last week we dose reduced carboplatin to AUC 1.5 for leukopenia but this week we will proceed with AUC 2.  Depending on white count, we will consider Neupogen injections as needed.  -Previously ordered additional testing on biopsy specimen for potential targeted therapy options including MMR testing, PD-L1 testing  -Plan for potential surgery following chemoradiation, pending response assessment to treatment.  -He was advised to continue omeprazole daily.

## 2023-05-16 NOTE — Progress Notes (Signed)
IV diphenhydramine changed to IV cetirizine in treatment plan per Dr. Arlana Pouch, as pt endorses that IV benadryl makes his legs "jumpy."

## 2023-05-16 NOTE — Assessment & Plan Note (Signed)
-   Leukopenia is now resolved.  He received Neupogen for 2 doses earlier this week.  No indication for Neupogen next week.  -Will continue to monitor labs each week and defer treatment if ANC is below 1000.

## 2023-05-16 NOTE — Progress Notes (Signed)
[facility]  ONCOLOGY CLINIC PROGRESS NOTE   Patient Name:  Seth Burch  MR # [id number]  DOB: Jan 19, 1956   Patient Care Team: Shelle Iron, MD as PCP - General (Family Medicine) Misenheimer, Marcial Pacas, MD as Consulting Physician (Gastroenterology) Meryl Crutch, MD as Consulting Physician (Hematology and Oncology)  Date of visit: 05/16/2023   ASSESSMENT & PLAN:   67 y.o. 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  Concern for lymph node involvement based on CT scan.   - On 04/04/2023, Dr. Elnoria Howard performed upper EUS.  It was at least T3, N 1/2, MX by endosonographic criteria.   -Staging PET scan on 04/07/2023 showed distal esophageal/GE junction adenocarcinoma.  Lower paraesophageal nodal metastasis.  No evidence of distant metastatic disease.  -Referral sent to cardiothoracic surgeon Dr. Cliffton Asters and to Dr. Mitzi Hansen, radiation oncologist for concurrent chemoradiation planning in the neoadjuvant setting.  -He started radiation treatments under the direction of Dr. Mitzi Hansen from 04/21/2023.  He started chemotherapy with carboplatin and paclitaxel from 04/25/2023.    -Due for dose # 4 of carboplatin and paclitaxel today.  Labs reveal normal white count at 4100 today, ANC 3000.  Creatinine is 1.4 today, slightly worse compared to last week.  No other dose-limiting toxicities.  Will proceed with cycle # 4 of carboplatin and paclitaxel today and continue this weekly.  Last week we dose reduced carboplatin to AUC 1.5 for leukopenia but this week we will proceed with AUC 2.  Depending on white count, we will consider Neupogen injections as needed.  -Previously  ordered additional testing on biopsy specimen for potential targeted therapy options including MMR testing, PD-L1 testing  -Plan for potential surgery following chemoradiation, pending response assessment to treatment.  -He was advised to continue omeprazole daily.  CKD (chronic kidney disease) stage 3, GFR 30-59 ml/min (HCC) History of kidney issues related to diabetes, creatinine is fluctuating and it is slightly worse at 1.4 today.  Patient seeing a kidney specialist. -Monitor kidney function during chemotherapy, ensure patient stays hydrated.  He was advised to drink at least 80 ounces of water a day.  Leukopenia due to antineoplastic chemotherapy (HCC) - Leukopenia is now resolved.  He received Neupogen for 2 doses earlier this week.  No indication for Neupogen next week.  -Will continue to monitor labs each week and defer treatment if ANC is below 1000.  MGUS (monoclonal gammopathy of unknown significance) -He has been on surveillance for IgG kappa MGUS. [name]  Repeat myeloma labs from 03/27/2023 showed M protein of 0.6 g/dL, stable.  IgG kappa type on IFE.  Kappa/lambda light chain ratio stable at 5.24.  Overall picture consistent with MGUS.  Will continue surveillance alone.   I will see him in 1 week for follow-up with repeat labs and for cycle 5 of chemotherapy.  He was advised to maintain adequate hydration to preserve his renal function.  I reviewed lab results and outside records for this visit and discussed relevant results with the patient. Diagnosis, plan of care and treatment options were also discussed in detail with the patient. Opportunity provided to ask questions and answers provided to his apparent satisfaction. Provided instructions to call our clinic with any problems, questions or concerns prior to  return visit. I recommended to continue follow-up with PCP and sub-specialists. He verbalized understanding and agreed with the plan.   NCCN guidelines have been consulted in the  planning of this patient's care.  I provided 30 minutes of face-to-face time during this encounter and > 50% was spent counseling as documented under my assessment and plan.    Meryl Crutch, MD  05/16/2023 1:29 PM  [facility] - A DEPT OF [facility] 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  Radiation treatment started from 04/21/2023.  Chemotherapy started from 04/25/2023.  INTERVAL HISTORY:    Discussed the use of AI scribe software for clinical note transcription with the patient, who gave verbal consent to proceed.   Seth Burch is here today for repeat clinical assessment.   He reports no major problems since the last visit. The patient's kidney numbers have been fluctuating, but the patient attributes this to not drinking as much water as before. The patient's friend, who had the same cancer, has already gone through the surgery and is doing well. The patient's treatments are scheduled to end on January 10th.  The patient reports improved swallowing ability and has gained weight. The patient denies any issues with nausea, vomiting, or sores in the mouth. The patient also denies any issues with blood in stools or black stools.  I have reviewed the past medical history, past surgical history, social history and family history with the patient and they are unchanged from previous note.  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 Jennye Boroughs after he  presented to his PCP with complaints of dysphagia and weight loss.  He started noticing difficulty in swallowing in January or February 2024.  He also reported unintentional weight loss of about 20 pounds in 1 year.  Previously he used to take ranitidine for intermittent acid reflux but it was stopped approximately in 2018.  He was started on omeprazole and an EGD was planned for further evaluation.  Also screening colonoscopy was recommended since last colonoscopy was in 2012.   03/11/2023 Pathology Results   Biopsy of stomach/gastric cardia: Cardiac mucosa showing at least intramucosal adenocarcinoma.  Biopsy of esophageal mass: Invasive, moderately differentiated adenocarcinoma.   03/11/2023 Procedure   EGD with biopsy by Dr.Misenheimer: Large mass in the distal esophagus between 30 and 38 cm.  Probable infiltration of the mass in the gastric cardia.  Colonoscopy on 03/11/2023: Mild diverticular disease in the left colon.   03/14/2023 Imaging   CT chest, abdomen and pelvis: Circumferential, masslike thickening in the lower third of esophagus, 7 cm in length, consistent with known primary esophageal malignancy.  Enlarged right paraesophageal lymph node measuring 1.2 x 0.8 cm, consistent with nodal metastatic disease.  No other evidence of lymphadenopathy or metastatic disease in the chest, abdomen or pelvis.  Background of very fine centrilobular pulmonary nodules, most concentrated in the lung apices as well as mild diffuse bronchial wall thickening, most commonly seen in smoking-related respiratory bronchiolitis.  Prostatomegaly.   04/04/2023 Procedure   EUS by Dr.Hung:  cT3 or cT4, N1/N2,Mx disease.   04/07/2023 PET scan   PET scan: distal esophageal/GE junction adenocarcinoma.  Lower paraesophageal nodal metastasis.  No evidence of distant metastatic disease.   04/24/2023 Cancer Staging   Staging form: Esophagus - Adenocarcinoma, AJCC 8th Edition - Clinical: Stage III (cT3, cN1, cM0, G2)  - Signed by Meryl Crutch, MD on 04/24/2023 Histologic grading system: 3 grade system   04/25/2023 -  Chemotherapy   Patient is on Treatment Plan : ESOPHAGUS Carboplatin + Paclitaxel Weekly X 6 Weeks with XRT         REVIEW OF SYSTEMS:   Review of Systems - Oncology  All other pertinent systems were reviewed with the patient and are negative.  ALLERGIES: He is allergic to no known allergies.  MEDICATIONS:  Current Outpatient Medications  Medication Sig Dispense Refill   ACCU-CHEK GUIDE test strip 1 each by Other route 2 (two) times daily.     amLODipine (NORVASC) 5 MG tablet Take 1 tablet by mouth daily.     Aspirin 81 MG CAPS Take 81 mg by mouth daily.     atorvastatin (LIPITOR) 20 MG tablet Take 1 tablet by mouth daily.     dexamethasone (DECADRON) 4 MG tablet Take 2 tablets daily for 2 days, start the day after chemotherapy. Take with food. 30 tablet 1   JARDIANCE 10 MG TABS tablet Take 1 tablet by mouth daily.     LANTUS SOLOSTAR 100 UNIT/ML Solostar Pen Inject 26 Units into the skin daily.     lidocaine-prilocaine (EMLA) cream Apply to affected area once 30 g 3   metFORMIN (GLUCOPHAGE) 500 MG tablet Take 2 tablets by mouth daily.     omeprazole (PRILOSEC) 40 MG capsule Take 40 mg by mouth daily.     ondansetron (ZOFRAN) 8 MG tablet Take 1 tablet (8 mg total) by mouth every 8 (eight) hours as needed for nausea or vomiting. Start on the third day after chemotherapy. 30 tablet 1   prochlorperazine (COMPAZINE) 10 MG tablet Take 1 tablet (10 mg total) by mouth every 6 (six) hours as needed for nausea or vomiting. 30 tablet 1   ramipril (ALTACE) 10 MG capsule Take 2 capsules by mouth daily.     tamsulosin (FLOMAX) 0.4 MG CAPS capsule Take 1 capsule by mouth daily.     No current facility-administered medications for this visit.   Facility-Administered Medications Ordered in Other Visits  Medication Dose Route Frequency Provider Last Rate Last Admin   0.9 %  sodium chloride  infusion   Intravenous Continuous Katheleen Stella, MD 10 mL/hr at 05/16/23 1214 New Bag at 05/16/23 1214   CARBOplatin (PARAPLATIN) 170 mg in sodium chloride 0.9 % 100 mL chemo infusion  170 mg Intravenous Once Kaelyn Nauta, MD       heparin lock flush 100 unit/mL  500 Units Intracatheter Once PRN Chriselda Leppert, MD       PACLitaxel (TAXOL) 102 mg in sodium chloride 0.9 % 250 mL chemo infusion (</= 80mg /m2)  50 mg/m2 (Order-Specific) Intravenous Once Diavion Labrador, MD       sodium chloride flush (NS) 0.9 % injection 10 mL  10 mL Intracatheter PRN King Pinzon, MD         VITALS:   Blood pressure 133/84, pulse 89, temperature (!) 97.3 F (36.3 C), temperature source Temporal, resp. rate 18, weight 180 lb 14.4 oz (82.1 kg), SpO2 97%.  Wt Readings from Last 3 Encounters:  05/16/23 180 lb 14.4 oz (82.1 kg)  05/13/23 178 lb 6.4 oz (80.9 kg)  05/09/23 182 lb (82.6 kg)    Body mass index is 25.96 kg/m.  Performance status (ECOG): 1 - Symptomatic but completely ambulatory  PHYSICAL EXAM:   Physical Exam Constitutional:      General: He is not in acute distress.    Appearance: Normal appearance.  HENT:     Head: Normocephalic and atraumatic.  Eyes:     General: No scleral icterus.    Conjunctiva/sclera: Conjunctivae normal.  Cardiovascular:     Rate and Rhythm: Normal rate and regular rhythm.     Heart sounds: Normal heart sounds.  Pulmonary:     Effort: Pulmonary effort is normal.     Breath sounds: Normal breath sounds.  Chest:     Comments: Right-sided Port-A-Cath in place without any signs of infection Abdominal:     General: There is no distension.  Musculoskeletal:     Right lower leg: No edema.     Left lower leg: No edema.  Neurological:     General: No focal deficit present.     Mental Status: He is alert and oriented to person, place, and time.  Psychiatric:        Mood and Affect: Mood normal.        Behavior: Behavior normal.        Thought Content: Thought  content normal.      LABORATORY DATA:   I have reviewed the data as listed.   Results for orders placed or performed in visit on 05/16/23 (from the past 72 hours)  CBC with Differential (Cancer Center Only)     Status: Abnormal   Collection Time: 05/16/23 10:39 AM  Result Value Ref Range   WBC Count 4.1 4.0 - 10.5 K/uL   RBC 4.34 4.22 - 5.81 MIL/uL   Hemoglobin 12.7 (L) 13.0 - 17.0 g/dL   HCT 16.1 (L) 09.6 - 04.5 %   MCV 86.6 80.0 - 100.0 fL   MCH 29.3 26.0 - 34.0 pg   MCHC 33.8 30.0 - 36.0 g/dL   RDW 40.9 81.1 - 91.4 %   Platelet Count 129 (L) 150 - 400 K/uL   nRBC 0.0 0.0 - 0.2 %   Neutrophils Relative % 72 %   Neutro Abs 3.0 1.7 - 7.7 K/uL   Lymphocytes Relative 7 %   Lymphs Abs 0.3 (L) 0.7 - 4.0 K/uL   Monocytes Relative 14 %   Monocytes Absolute 0.6 0.1 - 1.0 K/uL   Eosinophils Relative 3 %   Eosinophils Absolute 0.1 0.0 - 0.5 K/uL   Basophils Relative 2 %   Basophils Absolute 0.1 0.0 - 0.1 K/uL   Immature Granulocytes 2 %   Abs Immature Granulocytes 0.06 0.00 - 0.07 K/uL    Comment: Performed at [facility], [address], [place], [place] [address]  CMP (Cancer Center only)     Status: Abnormal   Collection Time: 05/16/23 10:39 AM  Result Value Ref Range   Sodium 137 135 - 145 mmol/L   Potassium 4.2 3.5 - 5.1 mmol/L   Chloride 105 98 - 111 mmol/L   CO2 26 22 - 32 mmol/L   Glucose, Bld 342 (H) 70 - 99 mg/dL    Comment: Glucose reference range applies only to samples taken after fasting for at least 8 hours.   BUN 24 (H) 8 - 23 mg/dL   Creatinine 6.21 (H) 3.08 - 1.24 mg/dL   Calcium 9.0 8.9 - 10.3  mg/dL   Total Protein 6.6 6.5 - 8.1 g/dL   Albumin 4.0 3.5 - 5.0 g/dL   AST 14 (L) 15 - 41 U/L   ALT 14 0 - 44 U/L   Alkaline Phosphatase 91 38 - 126 U/L   Total Bilirubin 0.4 <1.2 mg/dL   GFR, Estimated 55 (L) >60 mL/min    Comment: (NOTE) Calculated using the CKD-EPI Creatinine Equation (2021)    Anion gap 6 5 - 15    Comment:  Performed at [facility], [address], [place], [place] [address]     RADIOGRAPHIC STUDIES:  No recent imaging studies to review.   No results found.   CODE STATUS: Full code.   No orders of the defined types were placed in this encounter.    Future Appointments  Date Time Provider Department Center  05/16/2023  3:15 PM [facility] LINAC 4 [facility] None  05/16/2023  3:30 PM LINAC-[facility] None  05/19/2023  3:45 PM [facility] [id number] [facility] None  05/20/2023  1:30 PM [facility] [id number] [facility] None  05/22/2023 11:15 AM [facility] FLUSH [facility] None  05/22/2023 11:40 AM Nuvia Hileman, MD [facility] None  05/22/2023 12:30 PM [facility] INFUSION [facility] None  05/22/2023  4:15 PM [facility] [id number] [facility] None  05/23/2023  3:45 PM [facility] [id number] [facility] None  05/26/2023  3:45 PM [facility] [id number] [facility] None  05/27/2023  3:45 PM [facility] [id number] [facility] None  05/28/2023  3:45 PM [facility] [id number] [facility] None  05/29/2023  3:45 PM [facility] [id number] [facility] None  05/30/2023 11:15 AM [facility] FLUSH [facility] None  05/30/2023 11:40 AM Asberry Lascola, MD [facility] None  05/30/2023 12:30 PM [facility] INFUSION [facility] None  05/30/2023  4:15 PM [facility] LINAC 3 [facility] None  06/12/2023  2:00 PM Bonnita Levan, RD [facility] NDM  06/13/2023  2:00 PM Lightfoot, Eliezer Lofts, MD [facility]      This document was completed utilizing speech recognition software. Grammatical errors, random word insertions, pronoun errors, and incomplete sentences are an occasional consequence of this system due to software limitations, ambient noise, and hardware issues. Any formal questions or concerns about the content, text or information contained within the body of this dictation should be directly addressed to the provider for clarification.

## 2023-05-19 ENCOUNTER — Ambulatory Visit
Admission: RE | Admit: 2023-05-19 | Discharge: 2023-05-19 | Disposition: A | Discharge status: Home / Self Care | Payer: 59 | Source: Ambulatory Visit | Attending: Radiation Oncology | Admitting: Radiation Oncology

## 2023-05-19 ENCOUNTER — Other Ambulatory Visit: Payer: Self-pay

## 2023-05-19 DIAGNOSIS — Z5111 Encounter for antineoplastic chemotherapy: Secondary | ICD-10-CM | POA: Diagnosis not present

## 2023-05-19 LAB — RAD ONC ARIA SESSION SUMMARY
Course Elapsed Days: 28
Plan Fractions Treated to Date: 20
Plan Prescribed Dose Per Fraction: 1.8 Gy
Plan Total Fractions Prescribed: 25
Plan Total Prescribed Dose: 45 Gy
Reference Point Dosage Given to Date: 36 Gy
Reference Point Session Dosage Given: 1.8 Gy
Session Number: 20

## 2023-05-20 ENCOUNTER — Ambulatory Visit
Admission: RE | Admit: 2023-05-20 | Discharge: 2023-05-20 | Disposition: A | Discharge status: Home / Self Care | Payer: 59 | Source: Ambulatory Visit | Attending: Radiation Oncology

## 2023-05-20 ENCOUNTER — Other Ambulatory Visit: Payer: Self-pay

## 2023-05-20 DIAGNOSIS — Z5111 Encounter for antineoplastic chemotherapy: Secondary | ICD-10-CM | POA: Diagnosis not present

## 2023-05-20 LAB — RAD ONC ARIA SESSION SUMMARY
Course Elapsed Days: 29
Plan Fractions Treated to Date: 21
Plan Prescribed Dose Per Fraction: 1.8 Gy
Plan Total Fractions Prescribed: 25
Plan Total Prescribed Dose: 45 Gy
Reference Point Dosage Given to Date: 37.8 Gy
Reference Point Session Dosage Given: 1.8 Gy
Session Number: 21

## 2023-05-22 ENCOUNTER — Encounter: Payer: Self-pay | Admitting: Oncology

## 2023-05-22 ENCOUNTER — Inpatient Hospital Stay: Payer: 59

## 2023-05-22 ENCOUNTER — Ambulatory Visit: Payer: 59

## 2023-05-22 ENCOUNTER — Inpatient Hospital Stay: Payer: 59 | Attending: Oncology

## 2023-05-22 ENCOUNTER — Other Ambulatory Visit: Payer: Self-pay

## 2023-05-22 ENCOUNTER — Ambulatory Visit
Admission: RE | Admit: 2023-05-22 | Discharge: 2023-05-22 | Disposition: A | Discharge status: Home / Self Care | Payer: 59 | Source: Ambulatory Visit | Attending: Radiation Oncology | Admitting: Radiation Oncology

## 2023-05-22 ENCOUNTER — Inpatient Hospital Stay: Payer: 59 | Attending: Oncology | Admitting: Oncology

## 2023-05-22 VITALS — BP 133/77 | HR 91 | Temp 97.9°F | Resp 16 | Wt 178.5 lb

## 2023-05-22 DIAGNOSIS — I10 Essential (primary) hypertension: Secondary | ICD-10-CM | POA: Insufficient documentation

## 2023-05-22 DIAGNOSIS — C155 Malignant neoplasm of lower third of esophagus: Secondary | ICD-10-CM | POA: Insufficient documentation

## 2023-05-22 DIAGNOSIS — Z95828 Presence of other vascular implants and grafts: Secondary | ICD-10-CM

## 2023-05-22 DIAGNOSIS — T451X5A Adverse effect of antineoplastic and immunosuppressive drugs, initial encounter: Secondary | ICD-10-CM

## 2023-05-22 DIAGNOSIS — R0602 Shortness of breath: Secondary | ICD-10-CM | POA: Insufficient documentation

## 2023-05-22 DIAGNOSIS — K219 Gastro-esophageal reflux disease without esophagitis: Secondary | ICD-10-CM | POA: Insufficient documentation

## 2023-05-22 DIAGNOSIS — R5383 Other fatigue: Secondary | ICD-10-CM | POA: Diagnosis not present

## 2023-05-22 DIAGNOSIS — R200 Anesthesia of skin: Secondary | ICD-10-CM | POA: Insufficient documentation

## 2023-05-22 DIAGNOSIS — E785 Hyperlipidemia, unspecified: Secondary | ICD-10-CM | POA: Insufficient documentation

## 2023-05-22 DIAGNOSIS — R1310 Dysphagia, unspecified: Secondary | ICD-10-CM | POA: Diagnosis not present

## 2023-05-22 DIAGNOSIS — D701 Agranulocytosis secondary to cancer chemotherapy: Secondary | ICD-10-CM | POA: Diagnosis not present

## 2023-05-22 DIAGNOSIS — R531 Weakness: Secondary | ICD-10-CM | POA: Diagnosis not present

## 2023-05-22 DIAGNOSIS — E119 Type 2 diabetes mellitus without complications: Secondary | ICD-10-CM | POA: Diagnosis not present

## 2023-05-22 DIAGNOSIS — D6959 Other secondary thrombocytopenia: Secondary | ICD-10-CM | POA: Diagnosis not present

## 2023-05-22 DIAGNOSIS — R350 Frequency of micturition: Secondary | ICD-10-CM | POA: Diagnosis not present

## 2023-05-22 DIAGNOSIS — Z79899 Other long term (current) drug therapy: Secondary | ICD-10-CM | POA: Insufficient documentation

## 2023-05-22 LAB — CBC WITH DIFFERENTIAL (CANCER CENTER ONLY)
Abs Immature Granulocytes: 0.03 10*3/uL (ref 0.00–0.07)
Basophils Absolute: 0 10*3/uL (ref 0.0–0.1)
Basophils Relative: 1 %
Eosinophils Absolute: 0 10*3/uL (ref 0.0–0.5)
Eosinophils Relative: 2 %
HCT: 35.1 % — ABNORMAL LOW (ref 39.0–52.0)
Hemoglobin: 12.1 g/dL — ABNORMAL LOW (ref 13.0–17.0)
Immature Granulocytes: 1 %
Lymphocytes Relative: 7 %
Lymphs Abs: 0.2 10*3/uL — ABNORMAL LOW (ref 0.7–4.0)
MCH: 29 pg (ref 26.0–34.0)
MCHC: 34.5 g/dL (ref 30.0–36.0)
MCV: 84.2 fL (ref 80.0–100.0)
Monocytes Absolute: 0.1 10*3/uL (ref 0.1–1.0)
Monocytes Relative: 6 %
Neutro Abs: 1.7 10*3/uL (ref 1.7–7.7)
Neutrophils Relative %: 83 %
Platelet Count: 81 10*3/uL — ABNORMAL LOW (ref 150–400)
RBC: 4.17 MIL/uL — ABNORMAL LOW (ref 4.22–5.81)
RDW: 15.1 % (ref 11.5–15.5)
WBC Count: 2.1 10*3/uL — ABNORMAL LOW (ref 4.0–10.5)
nRBC: 0 % (ref 0.0–0.2)

## 2023-05-22 LAB — CMP (CANCER CENTER ONLY)
ALT: 16 U/L (ref 0–44)
AST: 15 U/L (ref 15–41)
Albumin: 3.9 g/dL (ref 3.5–5.0)
Alkaline Phosphatase: 86 U/L (ref 38–126)
Anion gap: 3 — ABNORMAL LOW (ref 5–15)
BUN: 27 mg/dL — ABNORMAL HIGH (ref 8–23)
CO2: 27 mmol/L (ref 22–32)
Calcium: 8.8 mg/dL — ABNORMAL LOW (ref 8.9–10.3)
Chloride: 105 mmol/L (ref 98–111)
Creatinine: 1.35 mg/dL — ABNORMAL HIGH (ref 0.61–1.24)
GFR, Estimated: 58 mL/min — ABNORMAL LOW (ref >60)
Glucose, Bld: 350 mg/dL — ABNORMAL HIGH (ref 70–99)
Potassium: 4.5 mmol/L (ref 3.5–5.1)
Sodium: 135 mmol/L (ref 135–145)
Total Bilirubin: 0.5 mg/dL (ref 0.0–1.2)
Total Protein: 6.5 g/dL (ref 6.5–8.1)

## 2023-05-22 LAB — RAD ONC ARIA SESSION SUMMARY
Course Elapsed Days: 31
Plan Fractions Treated to Date: 22
Plan Prescribed Dose Per Fraction: 1.8 Gy
Plan Total Fractions Prescribed: 25
Plan Total Prescribed Dose: 45 Gy
Reference Point Dosage Given to Date: 39.6 Gy
Reference Point Session Dosage Given: 1.8 Gy
Session Number: 22

## 2023-05-22 MED ORDER — FILGRASTIM-SNDZ 300 MCG/0.5ML IJ SOSY
300.0000 ug | PREFILLED_SYRINGE | Freq: Every day | INTRAMUSCULAR | Status: DC
  Administered 2023-05-22: 300 ug via SUBCUTANEOUS

## 2023-05-22 MED ORDER — SODIUM CHLORIDE 0.9% FLUSH
10.0000 mL | Freq: Once | INTRAVENOUS | Status: AC
Start: 2023-05-22 — End: 2023-05-22
  Administered 2023-05-22: 10 mL via INTRAVENOUS

## 2023-05-22 NOTE — Assessment & Plan Note (Signed)
-  Platelet count 81,000 today.  It is below the threshold of 100,000 to proceed with chemotherapy today.  Will hold chemo and plan to resume chemo next week if platelet count permits.

## 2023-05-22 NOTE — Assessment & Plan Note (Signed)
-   Leukopenia with white count of 2100, ANC of 1700.  Will proceed with Neupogen 1 dose today and tomorrow.  Held chemotherapy today because of thrombocytopenia  -Will continue to monitor labs each week and defer treatment if ANC is below 1000.

## 2023-05-22 NOTE — Progress Notes (Signed)
 [facility]  ONCOLOGY CLINIC PROGRESS NOTE   Patient Name:  Seth Burch  MR # [id number]  DOB: [birth year]   Patient Care Team: Marelyn Axon, MD as PCP - General (Family Medicine) Misenheimer, Evalene, MD as Consulting Physician (Gastroenterology) Autumn Millman, MD as Consulting Physician (Hematology and Oncology)  Date of visit: 05/22/2023   ASSESSMENT & PLAN:   68 y.o. 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  Concern for lymph node involvement based on CT scan.   - On 04/04/2023, Dr. Rollin performed upper EUS.  It was at least T3, N 1/2, MX by endosonographic criteria.   -Staging PET scan on 04/07/2023 showed distal esophageal/GE junction adenocarcinoma.  Lower paraesophageal nodal metastasis.  No evidence of distant metastatic disease.  -Referral sent to cardiothoracic surgeon Dr. Shyrl and to Dr. Dewey, radiation oncologist for concurrent chemoradiation planning in the neoadjuvant setting.  -He started radiation treatments under the direction of Dr. Dewey from 04/21/2023.  He started chemotherapy with carboplatin  and paclitaxel  from 04/25/2023.    -Due for dose # 5 of carboplatin  and paclitaxel  today.  Labs all were reviewed leukopenia with white count of 2100, ANC 1700.  Platelet count decreased at 81,000 today.  Hemoglobin 12.1.  Given platelet count below 100,000, we will hold chemotherapy this week.  He will receive Neupogen  300 mcg today and tomorrow.  Plan for reevaluation and possible resumption of chemotherapy next week.  -Previously ordered additional testing on biopsy specimen for potential targeted therapy options including MMR  testing, PD-L1 testing  -Plan for potential surgery following chemoradiation, pending response assessment to treatment.  -He was advised to continue omeprazole daily.  Chemotherapy-induced thrombocytopenia -Platelet count 81,000 today.  It is below the threshold of 100,000 to proceed with chemotherapy today.  Will hold chemo and plan to resume chemo next week if platelet count permits.  Leukopenia due to antineoplastic chemotherapy (HCC) - Leukopenia with white count of 2100, ANC of 1700.  Will proceed with Neupogen  1 dose today and tomorrow.  Held chemotherapy today because of thrombocytopenia  -Will continue to monitor labs each week and defer treatment if ANC is below 1000.  Potential cold symptoms Patient reports feeling like he is catching a cold, with sinus drainage during weather changes. -Advise patient to take over-the-counter remedies such as Nyquil, Claritin, Zyrtec , Allegra, or Afrin nasal spray as needed. -Continue to monitor symptoms and consider antibiotic therapy if neutrophils drop below 1,000.   I will see him in 1 week for follow-up with repeat labs and for cycle 5 of chemotherapy.  He was advised to maintain adequate hydration to preserve his renal function.  I reviewed lab results and outside records for this visit and discussed relevant results with the patient. Diagnosis, plan of care and treatment options were also discussed in detail with the patient. Opportunity provided to ask questions and answers provided to his apparent satisfaction. Provided instructions to call our clinic with any problems, questions or concerns prior to return visit. I recommended to continue follow-up with PCP and sub-specialists. He verbalized understanding and agreed with the plan.   NCCN guidelines have been consulted in the planning of this patient's care.  I provided 30 minutes of face-to-face time during this  encounter and > 50% was spent counseling as documented under my assessment  and plan.    Chinita Patten, MD  05/22/2023 4:00 PM  [facility] - A DEPT OF 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  Radiation treatment started from 04/21/2023.  Chemotherapy started from 04/25/2023.  INTERVAL HISTORY:    Discussed the use of AI scribe software for clinical note transcription with the patient, who gave verbal consent to proceed.   Seth Burch is here today for repeat clinical assessment.   He reports difficulty swallowing and a sensation of stiffness in the throat. He describes the sensation as if something is pulling backwards, and it affects the swallowing of all types of food and drink. He reports that the symptoms are not as severe as he was before the treatment started, but he seems to be gradually returning to that state.  He reports feeling weak at the beginning of the week, but his energy levels have since improved.  He also mentions a potential onset of a cold, with symptoms of sinus drainage that typically occur with weather changes. He has not noticed any bleeding, tingling, numbness, or fevers.  I have reviewed the past medical history, past surgical history, social history and family history with the patient and they are unchanged from previous note.  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 Larene after he presented to his PCP with complaints of dysphagia and weight loss.  He started noticing difficulty in  swallowing in January or February 2024.  He also reported unintentional weight loss of about 20 pounds in 1 year.  Previously he used to take ranitidine for intermittent acid reflux but it was stopped approximately in 2018.  He was started on omeprazole and an EGD was planned for further evaluation.  Also screening colonoscopy was recommended since last colonoscopy was in 2012.   03/11/2023 Pathology Results   Biopsy of stomach/gastric cardia: Cardiac mucosa showing at least intramucosal adenocarcinoma.  Biopsy of esophageal mass: Invasive, moderately differentiated adenocarcinoma.   03/11/2023 Procedure   EGD with biopsy by Dr.Misenheimer: Large mass in the distal esophagus between 30 and 38 cm.  Probable infiltration of the mass in the gastric cardia.  Colonoscopy on 03/11/2023: Mild diverticular disease in the left colon.   03/14/2023 Imaging   CT chest, abdomen and pelvis: Circumferential, masslike thickening in the lower third of esophagus, 7 cm in length, consistent with known primary esophageal malignancy.  Enlarged right paraesophageal lymph node measuring 1.2 x 0.8 cm, consistent with nodal metastatic disease.  No other evidence of lymphadenopathy or metastatic disease in the chest, abdomen or pelvis.  Background of very fine centrilobular pulmonary nodules, most concentrated in the lung apices as well as mild diffuse bronchial wall thickening, most commonly seen in smoking-related respiratory bronchiolitis.  Prostatomegaly.   04/04/2023 Procedure   EUS by Dr.Hung: cT3 or cT4, N1/N2,Mx disease.   04/07/2023 PET scan   PET scan: distal esophageal/GE junction adenocarcinoma.  Lower paraesophageal nodal metastasis.  No evidence of distant  metastatic disease.   04/24/2023 Cancer Staging   Staging form: Esophagus - Adenocarcinoma, AJCC 8th Edition - Clinical: Stage III (cT3, cN1, cM0, G2) - Signed by Autumn Millman, MD on 04/24/2023 Histologic grading system: 3 grade system   04/25/2023  -  Chemotherapy   Patient is on Treatment Plan : ESOPHAGUS Carboplatin  + Paclitaxel  Weekly X 6 Weeks with XRT         REVIEW OF SYSTEMS:   Review of Systems  Constitutional:  Positive for fatigue.  HENT:   Positive for trouble swallowing (starting to get dysphagia again).   Respiratory:  Positive for shortness of breath (with moderate exertion).   Genitourinary:  Positive for frequency.   Neurological:  Positive for numbness (chronic, in feet).    All other pertinent systems were reviewed with the patient and are negative.  ALLERGIES: He is allergic to no known allergies.  MEDICATIONS:  Current Outpatient Medications  Medication Sig Dispense Refill   ACCU-CHEK GUIDE test strip 1 each by Other route 2 (two) times daily.     amLODipine  (NORVASC ) 5 MG tablet Take 1 tablet by mouth daily.     Aspirin 81 MG CAPS Take 81 mg by mouth daily.     atorvastatin  (LIPITOR) 20 MG tablet Take 1 tablet by mouth daily.     JARDIANCE 10 MG TABS tablet Take 1 tablet by mouth daily.     LANTUS  SOLOSTAR 100 UNIT/ML Solostar Pen Inject 26 Units into the skin daily.     lidocaine -prilocaine  (EMLA ) cream Apply to affected area once 30 g 3   metFORMIN (GLUCOPHAGE) 500 MG tablet Take 2 tablets by mouth daily.     omeprazole (PRILOSEC) 40 MG capsule Take 40 mg by mouth daily.     ramipril (ALTACE) 10 MG capsule Take 2 capsules by mouth daily.     tamsulosin  (FLOMAX ) 0.4 MG CAPS capsule Take 1 capsule by mouth daily.     dexamethasone  (DECADRON ) 4 MG tablet Take 2 tablets daily for 2 days, start the day after chemotherapy. Take with food. (Patient not taking: Reported on 05/22/2023) 30 tablet 1   ondansetron  (ZOFRAN ) 8 MG tablet Take 1 tablet (8 mg total) by mouth every 8 (eight) hours as needed for nausea or vomiting. Start on the third day after chemotherapy. (Patient not taking: Reported on 05/22/2023) 30 tablet 1   prochlorperazine  (COMPAZINE ) 10 MG tablet Take 1 tablet (10 mg total) by mouth every 6 (six)  hours as needed for nausea or vomiting. (Patient not taking: Reported on 05/22/2023) 30 tablet 1   No current facility-administered medications for this visit.   Facility-Administered Medications Ordered in Other Visits  Medication Dose Route Frequency Provider Last Rate Last Admin   filgrastim -sndz (ZARXIO ) injection 300 mcg  300 mcg Subcutaneous Q1200 Abdi Husak, MD   300 mcg at 05/22/23 1306     VITALS:   Blood pressure 133/77, pulse 91, temperature 97.9 F (36.6 C), temperature source Temporal, resp. rate 16, weight 178 lb 8 oz (81 kg), SpO2 99%.  Wt Readings from Last 3 Encounters:  05/22/23 178 lb 8 oz (81 kg)  05/16/23 180 lb 14.4 oz (82.1 kg)  05/13/23 178 lb 6.4 oz (80.9 kg)    Body mass index is 25.61 kg/m.  Performance status (ECOG): 1 - Symptomatic but completely ambulatory  PHYSICAL EXAM:   Physical Exam Constitutional:      General: He is not in acute distress.    Appearance: Normal appearance.  HENT:  Head: Normocephalic and atraumatic.  Eyes:     General: No scleral icterus.    Conjunctiva/sclera: Conjunctivae normal.  Cardiovascular:     Rate and Rhythm: Normal rate and regular rhythm.     Heart sounds: Normal heart sounds.  Pulmonary:     Effort: Pulmonary effort is normal.     Breath sounds: Normal breath sounds.  Chest:     Comments: Right-sided Port-A-Cath in place without any signs of infection Abdominal:     General: There is no distension.  Musculoskeletal:     Right lower leg: No edema.     Left lower leg: No edema.  Neurological:     General: No focal deficit present.     Mental Status: He is alert and oriented to person, place, and time.  Psychiatric:        Mood and Affect: Mood normal.        Behavior: Behavior normal.        Thought Content: Thought content normal.      LABORATORY DATA:   I have reviewed the data as listed.   Results for orders placed or performed in visit on 05/22/23 (from the past 72 hours)  CMP  (Cancer Center only)     Status: Abnormal   Collection Time: 05/22/23 11:39 AM  Result Value Ref Range   Sodium 135 135 - 145 mmol/L   Potassium 4.5 3.5 - 5.1 mmol/L   Chloride 105 98 - 111 mmol/L   CO2 27 22 - 32 mmol/L   Glucose, Bld 350 (H) 70 - 99 mg/dL    Comment: Glucose reference range applies only to samples taken after fasting for at least 8 hours.   BUN 27 (H) 8 - 23 mg/dL   Creatinine 8.64 (H) 9.38 - 1.24 mg/dL   Calcium  8.8 (L) 8.9 - 10.3 mg/dL   Total Protein 6.5 6.5 - 8.1 g/dL   Albumin  3.9 3.5 - 5.0 g/dL   AST 15 15 - 41 U/L   ALT 16 0 - 44 U/L   Alkaline Phosphatase 86 38 - 126 U/L   Total Bilirubin 0.5 0.0 - 1.2 mg/dL   GFR, Estimated 58 (L) >60 mL/min    Comment: (NOTE) Calculated using the CKD-EPI Creatinine Equation (2021)    Anion gap 3 (L) 5 - 15    Comment: Performed at [facility], [address], [place], [place] [address]  CBC with Differential (Cancer Center Only)     Status: Abnormal   Collection Time: 05/22/23 11:39 AM  Result Value Ref Range   WBC Count 2.1 (L) 4.0 - 10.5 K/uL   RBC 4.17 (L) 4.22 - 5.81 MIL/uL   Hemoglobin 12.1 (L) 13.0 - 17.0 g/dL   HCT 64.8 (L) 60.9 - 47.9 %   MCV 84.2 80.0 - 100.0 fL   MCH 29.0 26.0 - 34.0 pg   MCHC 34.5 30.0 - 36.0 g/dL   RDW 84.8 88.4 - 84.4 %   Platelet Count 81 (L) 150 - 400 K/uL   nRBC 0.0 0.0 - 0.2 %   Neutrophils Relative % 83 %   Neutro Abs 1.7 1.7 - 7.7 K/uL   Lymphocytes Relative 7 %   Lymphs Abs 0.2 (L) 0.7 - 4.0 K/uL   Monocytes Relative 6 %   Monocytes Absolute 0.1 0.1 - 1.0 K/uL   Eosinophils Relative 2 %   Eosinophils Absolute 0.0 0.0 - 0.5 K/uL   Basophils Relative 1 %   Basophils Absolute  0.0 0.0 - 0.1 K/uL   Immature Granulocytes 1 %   Abs Immature Granulocytes 0.03 0.00 - 0.07 K/uL    Comment: Performed at [facility], [address], [place], [place] [address]     RADIOGRAPHIC STUDIES:  No recent imaging studies to review.    No results found.   CODE STATUS: Full code.   Orders Placed This Encounter  Procedures   CBC with Differential (Cancer Center Only)    Standing Status:   Future    Expected Date:   05/30/2023    Expiration Date:   05/29/2024   CMP (Cancer Center only)    Standing Status:   Future    Expected Date:   05/30/2023    Expiration Date:   05/29/2024     Future Appointments  Date Time Provider Department Center  05/23/2023  3:00 PM [facility] FLUSH [facility] None  05/23/2023  3:45 PM [facility] [id number] [facility] None  05/23/2023  4:00 PM [facility] None  05/26/2023  3:45 PM [facility] [id number] [facility] None  05/27/2023  3:45 PM [facility] [id number] [facility] None  05/28/2023  3:45 PM [facility] [id number] [facility] None  05/29/2023  3:45 PM [facility] [id number] [facility] None  05/30/2023 11:15 AM [facility] FLUSH [facility] None  05/30/2023 11:40 AM Mikylah Ackroyd, MD [facility] None  05/30/2023 12:30 PM [facility] INFUSION [facility] None  05/30/2023  4:15 PM [facility] LINAC 3 [facility] None  06/12/2023  2:00 PM Knox Leita CROME, RD [facility] NDM  06/13/2023  2:00 PM Lightfoot, Linnie KIDD, MD [facility]      This document was completed utilizing speech recognition software. Grammatical errors, random word insertions, pronoun errors, and incomplete sentences are an occasional consequence of this system due to software limitations, ambient noise, and hardware issues. Any formal questions or concerns about the content, text or information contained within the body of this dictation should be directly addressed to the provider for clarification.

## 2023-05-22 NOTE — Assessment & Plan Note (Signed)
 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  Concern for lymph node involvement based on CT scan.   - On 04/04/2023, Dr. Rollin performed upper EUS.  It was at least T3, N 1/2, MX by endosonographic criteria.   -Staging PET scan on 04/07/2023 showed distal esophageal/GE junction adenocarcinoma.  Lower paraesophageal nodal metastasis.  No evidence of distant metastatic disease.  -Referral sent to cardiothoracic surgeon Dr. Shyrl and to Dr. Dewey, radiation oncologist for concurrent chemoradiation planning in the neoadjuvant setting.  -He started radiation treatments under the direction of Dr. Dewey from 04/21/2023.  He started chemotherapy with carboplatin  and paclitaxel  from 04/25/2023.    -Due for dose # 5 of carboplatin  and paclitaxel  today.  Labs all were reviewed leukopenia with white count of 2100, ANC 1700.  Platelet count decreased at 81,000 today.  Hemoglobin 12.1.  Given platelet count below 100,000, we will hold chemotherapy this week.  He will receive Neupogen  300 mcg today and tomorrow.  Plan for reevaluation and possible resumption of chemotherapy next week.  -Previously ordered additional testing on biopsy specimen for potential targeted therapy options including MMR testing, PD-L1 testing  -Plan for potential surgery following chemoradiation, pending response assessment to treatment.  -He was advised to continue omeprazole daily.

## 2023-05-23 ENCOUNTER — Inpatient Hospital Stay: Payer: 59

## 2023-05-23 ENCOUNTER — Other Ambulatory Visit: Payer: Self-pay

## 2023-05-23 ENCOUNTER — Ambulatory Visit
Admission: RE | Admit: 2023-05-23 | Discharge: 2023-05-23 | Disposition: A | Discharge status: Home / Self Care | Payer: 59 | Source: Ambulatory Visit | Attending: Radiation Oncology | Admitting: Radiation Oncology

## 2023-05-23 ENCOUNTER — Ambulatory Visit: Payer: 59

## 2023-05-23 VITALS — BP 129/82 | HR 94 | Temp 98.4°F | Resp 17

## 2023-05-23 DIAGNOSIS — Z95828 Presence of other vascular implants and grafts: Secondary | ICD-10-CM

## 2023-05-23 DIAGNOSIS — C155 Malignant neoplasm of lower third of esophagus: Secondary | ICD-10-CM | POA: Diagnosis not present

## 2023-05-23 LAB — RAD ONC ARIA SESSION SUMMARY
Course Elapsed Days: 32
Plan Fractions Treated to Date: 23
Plan Prescribed Dose Per Fraction: 1.8 Gy
Plan Total Fractions Prescribed: 25
Plan Total Prescribed Dose: 45 Gy
Reference Point Dosage Given to Date: 41.4 Gy
Reference Point Session Dosage Given: 1.8 Gy
Session Number: 23

## 2023-05-23 MED ORDER — FILGRASTIM-SNDZ 300 MCG/0.5ML IJ SOSY
300.0000 ug | PREFILLED_SYRINGE | Freq: Every day | INTRAMUSCULAR | Status: DC
  Administered 2023-05-23: 300 ug via SUBCUTANEOUS
  Filled 2023-05-23: qty 0.5

## 2023-05-26 ENCOUNTER — Ambulatory Visit
Admission: RE | Admit: 2023-05-26 | Discharge: 2023-05-26 | Disposition: A | Discharge status: Home / Self Care | Payer: 59 | Source: Ambulatory Visit | Attending: Radiation Oncology | Admitting: Radiation Oncology

## 2023-05-26 ENCOUNTER — Other Ambulatory Visit: Payer: Self-pay

## 2023-05-26 DIAGNOSIS — C155 Malignant neoplasm of lower third of esophagus: Secondary | ICD-10-CM | POA: Diagnosis not present

## 2023-05-26 LAB — RAD ONC ARIA SESSION SUMMARY
Course Elapsed Days: 35
Plan Fractions Treated to Date: 24
Plan Prescribed Dose Per Fraction: 1.8 Gy
Plan Total Fractions Prescribed: 25
Plan Total Prescribed Dose: 45 Gy
Reference Point Dosage Given to Date: 43.2 Gy
Reference Point Session Dosage Given: 1.8 Gy
Session Number: 24

## 2023-05-27 ENCOUNTER — Ambulatory Visit
Admission: RE | Admit: 2023-05-27 | Discharge: 2023-05-27 | Disposition: A | Discharge status: Home / Self Care | Payer: 59 | Source: Ambulatory Visit | Attending: Radiation Oncology | Admitting: Radiation Oncology

## 2023-05-27 ENCOUNTER — Other Ambulatory Visit: Payer: Self-pay

## 2023-05-27 DIAGNOSIS — C155 Malignant neoplasm of lower third of esophagus: Secondary | ICD-10-CM | POA: Diagnosis not present

## 2023-05-27 LAB — RAD ONC ARIA SESSION SUMMARY
Course Elapsed Days: 36
Plan Fractions Treated to Date: 25
Plan Prescribed Dose Per Fraction: 1.8 Gy
Plan Total Fractions Prescribed: 25
Plan Total Prescribed Dose: 45 Gy
Reference Point Dosage Given to Date: 45 Gy
Reference Point Session Dosage Given: 1.8 Gy
Session Number: 25

## 2023-05-28 ENCOUNTER — Other Ambulatory Visit: Payer: Self-pay

## 2023-05-28 ENCOUNTER — Ambulatory Visit
Admission: RE | Admit: 2023-05-28 | Discharge: 2023-05-28 | Disposition: A | Discharge status: Home / Self Care | Payer: 59 | Source: Ambulatory Visit | Attending: Radiation Oncology | Admitting: Radiation Oncology

## 2023-05-28 DIAGNOSIS — C155 Malignant neoplasm of lower third of esophagus: Secondary | ICD-10-CM | POA: Diagnosis not present

## 2023-05-28 LAB — RAD ONC ARIA SESSION SUMMARY
Course Elapsed Days: 37
Plan Fractions Treated to Date: 1
Plan Prescribed Dose Per Fraction: 1.8 Gy
Plan Total Fractions Prescribed: 3
Plan Total Prescribed Dose: 5.4 Gy
Reference Point Dosage Given to Date: 1.8 Gy
Reference Point Session Dosage Given: 1.8 Gy
Session Number: 26

## 2023-05-29 ENCOUNTER — Other Ambulatory Visit: Payer: Self-pay

## 2023-05-29 ENCOUNTER — Ambulatory Visit
Admission: RE | Admit: 2023-05-29 | Discharge: 2023-05-29 | Disposition: A | Discharge status: Home / Self Care | Payer: 59 | Source: Ambulatory Visit | Attending: Radiation Oncology | Admitting: Radiation Oncology

## 2023-05-29 ENCOUNTER — Encounter: Payer: Self-pay | Admitting: Oncology

## 2023-05-29 DIAGNOSIS — C155 Malignant neoplasm of lower third of esophagus: Secondary | ICD-10-CM | POA: Diagnosis not present

## 2023-05-29 LAB — RAD ONC ARIA SESSION SUMMARY
Course Elapsed Days: 38
Plan Fractions Treated to Date: 2
Plan Prescribed Dose Per Fraction: 1.8 Gy
Plan Total Fractions Prescribed: 3
Plan Total Prescribed Dose: 5.4 Gy
Reference Point Dosage Given to Date: 3.6 Gy
Reference Point Session Dosage Given: 1.8 Gy
Session Number: 27

## 2023-05-30 ENCOUNTER — Ambulatory Visit
Admission: RE | Admit: 2023-05-30 | Discharge: 2023-05-30 | Disposition: A | Discharge status: Home / Self Care | Payer: 59 | Source: Ambulatory Visit | Attending: Radiation Oncology

## 2023-05-30 ENCOUNTER — Encounter: Payer: Self-pay | Admitting: Oncology

## 2023-05-30 ENCOUNTER — Inpatient Hospital Stay: Payer: 59

## 2023-05-30 ENCOUNTER — Inpatient Hospital Stay: Payer: 59 | Admitting: Oncology

## 2023-05-30 ENCOUNTER — Other Ambulatory Visit: Payer: Self-pay

## 2023-05-30 VITALS — BP 126/80 | HR 85 | Temp 97.5°F | Resp 19 | Wt 177.6 lb

## 2023-05-30 DIAGNOSIS — C155 Malignant neoplasm of lower third of esophagus: Secondary | ICD-10-CM

## 2023-05-30 DIAGNOSIS — D701 Agranulocytosis secondary to cancer chemotherapy: Secondary | ICD-10-CM

## 2023-05-30 DIAGNOSIS — Z95828 Presence of other vascular implants and grafts: Secondary | ICD-10-CM

## 2023-05-30 DIAGNOSIS — D6959 Other secondary thrombocytopenia: Secondary | ICD-10-CM | POA: Diagnosis not present

## 2023-05-30 DIAGNOSIS — T451X5A Adverse effect of antineoplastic and immunosuppressive drugs, initial encounter: Secondary | ICD-10-CM | POA: Diagnosis not present

## 2023-05-30 LAB — CBC WITH DIFFERENTIAL (CANCER CENTER ONLY)
Abs Immature Granulocytes: 0.01 10*3/uL (ref 0.00–0.07)
Basophils Absolute: 0 10*3/uL (ref 0.0–0.1)
Basophils Relative: 1 %
Eosinophils Absolute: 0 10*3/uL (ref 0.0–0.5)
Eosinophils Relative: 2 %
HCT: 33.5 % — ABNORMAL LOW (ref 39.0–52.0)
Hemoglobin: 11.4 g/dL — ABNORMAL LOW (ref 13.0–17.0)
Immature Granulocytes: 1 %
Lymphocytes Relative: 9 %
Lymphs Abs: 0.2 10*3/uL — ABNORMAL LOW (ref 0.7–4.0)
MCH: 29.7 pg (ref 26.0–34.0)
MCHC: 34 g/dL (ref 30.0–36.0)
MCV: 87.2 fL (ref 80.0–100.0)
Monocytes Absolute: 0.4 10*3/uL (ref 0.1–1.0)
Monocytes Relative: 19 %
Neutro Abs: 1.4 10*3/uL — ABNORMAL LOW (ref 1.7–7.7)
Neutrophils Relative %: 68 %
Platelet Count: 117 10*3/uL — ABNORMAL LOW (ref 150–400)
RBC: 3.84 MIL/uL — ABNORMAL LOW (ref 4.22–5.81)
RDW: 16.9 % — ABNORMAL HIGH (ref 11.5–15.5)
WBC Count: 2 10*3/uL — ABNORMAL LOW (ref 4.0–10.5)
nRBC: 0 % (ref 0.0–0.2)

## 2023-05-30 LAB — RAD ONC ARIA SESSION SUMMARY
Course Elapsed Days: 39
Plan Fractions Treated to Date: 3
Plan Prescribed Dose Per Fraction: 1.8 Gy
Plan Total Fractions Prescribed: 3
Plan Total Prescribed Dose: 5.4 Gy
Reference Point Dosage Given to Date: 5.4 Gy
Reference Point Session Dosage Given: 1.8 Gy
Session Number: 28

## 2023-05-30 LAB — CMP (CANCER CENTER ONLY)
ALT: 15 U/L (ref 0–44)
AST: 16 U/L (ref 15–41)
Albumin: 4 g/dL (ref 3.5–5.0)
Alkaline Phosphatase: 107 U/L (ref 38–126)
Anion gap: 5 (ref 5–15)
BUN: 24 mg/dL — ABNORMAL HIGH (ref 8–23)
CO2: 28 mmol/L (ref 22–32)
Calcium: 9.1 mg/dL (ref 8.9–10.3)
Chloride: 107 mmol/L (ref 98–111)
Creatinine: 1.34 mg/dL — ABNORMAL HIGH (ref 0.61–1.24)
GFR, Estimated: 58 mL/min — ABNORMAL LOW (ref >60)
Glucose, Bld: 173 mg/dL — ABNORMAL HIGH (ref 70–99)
Potassium: 4.3 mmol/L (ref 3.5–5.1)
Sodium: 140 mmol/L (ref 135–145)
Total Bilirubin: 0.5 mg/dL (ref 0.0–1.2)
Total Protein: 6.7 g/dL (ref 6.5–8.1)

## 2023-05-30 MED ORDER — SODIUM CHLORIDE 0.9% FLUSH
10.0000 mL | INTRAVENOUS | Status: DC | PRN
Start: 2023-05-30 — End: 2023-05-30
  Administered 2023-05-30: 10 mL

## 2023-05-30 MED ORDER — FAMOTIDINE IN NACL 20-0.9 MG/50ML-% IV SOLN
20.0000 mg | Freq: Once | INTRAVENOUS | Status: AC
  Administered 2023-05-30: 20 mg via INTRAVENOUS
  Filled 2023-05-30: qty 50

## 2023-05-30 MED ORDER — SODIUM CHLORIDE 0.9 % IV SOLN
130.0000 mg | Freq: Once | INTRAVENOUS | Status: AC
  Administered 2023-05-30: 130 mg via INTRAVENOUS
  Filled 2023-05-30: qty 13

## 2023-05-30 MED ORDER — PACLITAXEL CHEMO INJECTION 300 MG/50ML
50.0000 mg/m2 | Freq: Once | INTRAVENOUS | Status: AC
  Administered 2023-05-30: 102 mg via INTRAVENOUS
  Filled 2023-05-30: qty 17

## 2023-05-30 MED ORDER — CETIRIZINE HCL 10 MG/ML IV SOLN
10.0000 mg | Freq: Once | INTRAVENOUS | Status: AC
  Administered 2023-05-30: 10 mg via INTRAVENOUS
  Filled 2023-05-30: qty 1

## 2023-05-30 MED ORDER — SODIUM CHLORIDE 0.9 % IV SOLN: INTRAVENOUS | Status: DC

## 2023-05-30 MED ORDER — PALONOSETRON HCL INJECTION 0.25 MG/5ML
0.2500 mg | Freq: Once | INTRAVENOUS | Status: AC
  Administered 2023-05-30: 0.25 mg via INTRAVENOUS
  Filled 2023-05-30: qty 5

## 2023-05-30 MED ORDER — DEXAMETHASONE SODIUM PHOSPHATE 10 MG/ML IJ SOLN
10.0000 mg | Freq: Once | INTRAMUSCULAR | Status: AC
  Administered 2023-05-30: 10 mg via INTRAVENOUS
  Filled 2023-05-30: qty 1

## 2023-05-30 MED ORDER — HEPARIN SOD (PORK) LOCK FLUSH 100 UNIT/ML IV SOLN
500.0000 [IU] | Freq: Once | INTRAVENOUS | Status: AC | PRN
  Administered 2023-05-30: 500 [IU]

## 2023-05-30 MED ORDER — SODIUM CHLORIDE 0.9% FLUSH
10.0000 mL | Freq: Once | INTRAVENOUS | Status: AC
  Administered 2023-05-30: 10 mL

## 2023-05-30 NOTE — Patient Instructions (Signed)

## 2023-05-30 NOTE — Progress Notes (Signed)
 [facility]  ONCOLOGY CLINIC PROGRESS NOTE   Patient Name:  Seth Burch  MR # [id number]  DOB: 11/15/1955   Patient Care Team: Marelyn Axon, MD as PCP - General (Family Medicine) Misenheimer, Evalene, MD as Consulting Physician (Gastroenterology) Autumn Millman, MD as Consulting Physician (Hematology and Oncology)  Date of visit: 05/30/2023   ASSESSMENT & PLAN:   68 y.o. 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  Concern for lymph node involvement based on CT scan.   - On 04/04/2023, Dr. Rollin performed upper EUS.  It was at least T3, N 1/2, MX by endosonographic criteria.   -Staging PET scan on 04/07/2023 showed distal esophageal/GE junction adenocarcinoma.  Lower paraesophageal nodal metastasis.  No evidence of distant metastatic disease.  -Referral sent to cardiothoracic surgeon Dr. Shyrl and to Dr. Dewey, radiation oncologist for concurrent chemoradiation planning in the neoadjuvant setting.  -He started radiation treatments under the direction of Dr. Dewey from 04/21/2023.  He started chemotherapy with carboplatin  and paclitaxel  from 04/25/2023.    -Due for dose # 6 of carboplatin  and paclitaxel  today.  Labs all were reviewed leukopenia with white count of 2000, ANC 1400.  Platelet count improved at 117,000 today.  Hemoglobin 11.4  We will proceed with chemo today but continue with Carboplatin  AUC 1.5 dose. He will return next week for 2 days for neupogen .   -Previously ordered additional testing on biopsy specimen for potential targeted therapy options including MMR testing, PD-L1 testing  -Plan for potential surgery following  chemoradiation, pending response assessment to treatment. He has appt coming up with Dr.Lightfoot on 06/13/23.  -He was advised to continue omeprazole daily.  - Depending on surgery outcome, we will plan for adjuvant Nivolumab  for up to one year. This was briefly discussed with patient today.   Leukopenia due to antineoplastic chemotherapy (HCC) - Leukopenia with white count of 2000, ANC of 1400.  Will proceed with chemo today as scheduled and give him Neupogen  for 2 days next week.  -Since this is his last of the planned chemotherapy, we expect blood counts to slowly improve over the next few weeks.   Chemotherapy-induced thrombocytopenia -Platelet count 117,000 today. Since it is above 100,000, we proceeded with chemo today.    RTC in 4 weeks for routine follow up.   I reviewed lab results and outside records for this visit and discussed relevant results with the patient. Diagnosis, plan of care and treatment options were also discussed in detail with the patient. Opportunity provided to ask questions and answers provided to his apparent satisfaction. Provided instructions to call our clinic with any problems, questions or concerns prior to return visit. I recommended to continue follow-up with PCP and sub-specialists. He verbalized understanding and agreed with the plan.   NCCN guidelines have been consulted in the planning of this patient's care.  I provided 30 minutes of face-to-face time during this encounter and > 50% was spent counseling as documented under my assessment and plan.    Millman Autumn, MD  05/30/2023 8:01 PM  [facility] - A DEPT OF [facility] 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  Radiation treatment started from 04/21/2023.  Chemotherapy started from 04/25/2023.  INTERVAL HISTORY:    Discussed the use of AI scribe software for clinical note transcription with the patient, who gave verbal consent to proceed.   Seth Burch is here today for repeat clinical assessment.   He reports feeling pretty good today. He has been receiving booster shots to manage his low white blood cell, which have improved to a safe level. The patient's kidney function has also improved slightly, attributed to hydration. The patient reports some difficulty swallowing, with tenderness experienced during eating. Despite this, the patient has been able to maintain his appetite.  I have reviewed the past medical history, past surgical history, social history and family history with the patient and they are unchanged from previous note.  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 Larene after he presented to his PCP with complaints of dysphagia and weight loss.  He started noticing difficulty in swallowing in January or February 2024.  He also reported unintentional weight loss of about 20 pounds in 1 year.  Previously he used to take ranitidine for intermittent acid reflux but it was stopped approximately in 2018.  He was started on omeprazole and an EGD was planned for further evaluation.  Also screening colonoscopy was recommended since last colonoscopy was in 2012.   03/11/2023 Pathology Results   Biopsy of stomach/gastric cardia: Cardiac mucosa showing at least intramucosal adenocarcinoma.  Biopsy of esophageal mass: Invasive, moderately differentiated adenocarcinoma.   03/11/2023 Procedure   EGD with biopsy by Dr.Misenheimer: Large  mass in the distal esophagus between 30 and 38 cm.  Probable infiltration of the mass in the gastric cardia.  Colonoscopy on 03/11/2023: Mild diverticular disease in the left colon.   03/14/2023 Imaging   CT chest, abdomen and pelvis: Circumferential, masslike thickening in the lower third of esophagus, 7 cm in length, consistent with known primary esophageal malignancy.  Enlarged right paraesophageal lymph node measuring 1.2 x 0.8 cm, consistent with nodal metastatic disease.  No other evidence of lymphadenopathy or metastatic disease in the chest, abdomen or pelvis.  Background of very fine centrilobular pulmonary nodules, most concentrated in the lung apices as well as mild diffuse bronchial wall thickening, most commonly seen in smoking-related respiratory bronchiolitis.  Prostatomegaly.   04/04/2023 Procedure   EUS by Dr.Hung: cT3 or cT4, N1/N2,Mx disease.   04/07/2023 PET scan   PET scan: distal esophageal/GE junction adenocarcinoma.  Lower paraesophageal nodal metastasis.  No evidence of distant metastatic disease.   04/24/2023 Cancer Staging   Staging form: Esophagus - Adenocarcinoma, AJCC 8th Edition - Clinical: Stage III (cT3, cN1, cM0, G2) - Signed by Autumn Millman, MD on 04/24/2023 Histologic grading system: 3 grade system   04/25/2023 -  Chemotherapy   Patient is on Treatment Plan : ESOPHAGUS Carboplatin  + Paclitaxel  Weekly X 6 Weeks with XRT         REVIEW OF SYSTEMS:   Review of Systems  Constitutional:  Positive for fatigue.  HENT:   Positive for trouble swallowing (starting to get dysphagia again).   Respiratory:  Positive for shortness of breath (with moderate  exertion).   Genitourinary:  Positive for frequency.   Neurological:  Positive for numbness (chronic, in feet).    All other pertinent systems were reviewed with the patient and are negative.  ALLERGIES: He is allergic to no known allergies.  MEDICATIONS:  Current Outpatient Medications  Medication Sig  Dispense Refill   ACCU-CHEK GUIDE test strip 1 each by Other route 2 (two) times daily.     amLODipine  (NORVASC ) 5 MG tablet Take 1 tablet by mouth daily.     Aspirin 81 MG CAPS Take 81 mg by mouth daily.     atorvastatin  (LIPITOR) 20 MG tablet Take 1 tablet by mouth daily.     dexamethasone  (DECADRON ) 4 MG tablet Take 2 tablets daily for 2 days, start the day after chemotherapy. Take with food. 30 tablet 1   JARDIANCE 10 MG TABS tablet Take 1 tablet by mouth daily.     LANTUS  SOLOSTAR 100 UNIT/ML Solostar Pen Inject 26 Units into the skin daily.     lidocaine -prilocaine  (EMLA ) cream Apply to affected area once 30 g 3   metFORMIN (GLUCOPHAGE) 500 MG tablet Take 2 tablets by mouth daily.     omeprazole (PRILOSEC) 40 MG capsule Take 40 mg by mouth daily.     ramipril (ALTACE) 10 MG capsule Take 2 capsules by mouth daily.     tamsulosin  (FLOMAX ) 0.4 MG CAPS capsule Take 1 capsule by mouth daily.     ondansetron  (ZOFRAN ) 8 MG tablet Take 1 tablet (8 mg total) by mouth every 8 (eight) hours as needed for nausea or vomiting. Start on the third day after chemotherapy. (Patient not taking: Reported on 05/30/2023) 30 tablet 1   prochlorperazine  (COMPAZINE ) 10 MG tablet Take 1 tablet (10 mg total) by mouth every 6 (six) hours as needed for nausea or vomiting. (Patient not taking: Reported on 05/30/2023) 30 tablet 1   No current facility-administered medications for this visit.   Facility-Administered Medications Ordered in Other Visits  Medication Dose Route Frequency Provider Last Rate Last Admin   0.9 %  sodium chloride  infusion   Intravenous Continuous Scott Vanderveer, MD   Stopped at 05/30/23 1522   sodium chloride  flush (NS) 0.9 % injection 10 mL  10 mL Intracatheter PRN Sherilee Smotherman, MD   10 mL at 05/30/23 1521     VITALS:   Blood pressure 126/80, pulse 85, temperature (!) 97.5 F (36.4 C), temperature source Temporal, resp. rate 19, weight 177 lb 9.6 oz (80.6 kg), SpO2 100%.  Wt Readings  from Last 3 Encounters:  05/30/23 177 lb 9.6 oz (80.6 kg)  05/22/23 178 lb 8 oz (81 kg)  05/16/23 180 lb 14.4 oz (82.1 kg)    Body mass index is 25.48 kg/m.  Performance status (ECOG): 1 - Symptomatic but completely ambulatory  PHYSICAL EXAM:   Physical Exam Constitutional:      General: He is not in acute distress.    Appearance: Normal appearance.  HENT:     Head: Normocephalic and atraumatic.  Eyes:     General: No scleral icterus.    Conjunctiva/sclera: Conjunctivae normal.  Cardiovascular:     Rate and Rhythm: Normal rate and regular rhythm.     Heart sounds: Normal heart sounds.  Pulmonary:     Effort: Pulmonary effort is normal.     Breath sounds: Normal breath sounds.  Chest:     Comments: Right-sided Port-A-Cath in place without any signs of infection Abdominal:     General: There is no distension.  Musculoskeletal:     Right lower leg: No edema.     Left lower leg: No edema.  Neurological:     General: No focal deficit present.     Mental Status: He is alert and oriented to person, place, and time.  Psychiatric:        Mood and Affect: Mood normal.        Behavior: Behavior normal.        Thought Content: Thought content normal.      LABORATORY DATA:   I have reviewed the data as listed.   Results for orders placed or performed in visit on 05/30/23 (from the past 72 hours)  CMP (Cancer Center only)     Status: Abnormal   Collection Time: 05/30/23 10:39 AM  Result Value Ref Range   Sodium 140 135 - 145 mmol/L   Potassium 4.3 3.5 - 5.1 mmol/L   Chloride 107 98 - 111 mmol/L   CO2 28 22 - 32 mmol/L   Glucose, Bld 173 (H) 70 - 99 mg/dL    Comment: Glucose reference range applies only to samples taken after fasting for at least 8 hours.   BUN 24 (H) 8 - 23 mg/dL   Creatinine 8.65 (H) 9.38 - 1.24 mg/dL   Calcium  9.1 8.9 - 10.3 mg/dL   Total Protein 6.7 6.5 - 8.1 g/dL   Albumin  4.0 3.5 - 5.0 g/dL   AST 16 15 - 41 U/L   ALT 15 0 - 44 U/L   Alkaline  Phosphatase 107 38 - 126 U/L   Total Bilirubin 0.5 0.0 - 1.2 mg/dL   GFR, Estimated 58 (L) >60 mL/min    Comment: (NOTE) Calculated using the CKD-EPI Creatinine Equation (2021)    Anion gap 5 5 - 15    Comment: Performed at [facility], [address] [place], [place] [address]  CBC with Differential (Cancer Center Only)     Status: Abnormal   Collection Time: 05/30/23 10:39 AM  Result Value Ref Range   WBC Count 2.0 (L) 4.0 - 10.5 K/uL   RBC 3.84 (L) 4.22 - 5.81 MIL/uL   Hemoglobin 11.4 (L) 13.0 - 17.0 g/dL   HCT 66.4 (L) 60.9 - 47.9 %   MCV 87.2 80.0 - 100.0 fL   MCH 29.7 26.0 - 34.0 pg   MCHC 34.0 30.0 - 36.0 g/dL   RDW 83.0 (H) 88.4 - 84.4 %   Platelet Count 117 (L) 150 - 400 K/uL   nRBC 0.0 0.0 - 0.2 %   Neutrophils Relative % 68 %   Neutro Abs 1.4 (L) 1.7 - 7.7 K/uL   Lymphocytes Relative 9 %   Lymphs Abs 0.2 (L) 0.7 - 4.0 K/uL   Monocytes Relative 19 %   Monocytes Absolute 0.4 0.1 - 1.0 K/uL   Eosinophils Relative 2 %   Eosinophils Absolute 0.0 0.0 - 0.5 K/uL   Basophils Relative 1 %   Basophils Absolute 0.0 0.0 - 0.1 K/uL   Immature Granulocytes 1 %   Abs Immature Granulocytes 0.01 0.00 - 0.07 K/uL    Comment: Performed at [facility], [address] [place], [place] [address]     RADIOGRAPHIC STUDIES:  No recent imaging studies to review.   No results found.   CODE STATUS: Full code.   No orders of the defined types were placed in this encounter.    Future Appointments  Date Time Provider Department Center  06/12/2023  2:00 PM Knox Leita CROME, RD [facility] NDM  06/13/2023  2:00 PM Shyrl Linnie KIDD, MD [facility]  06/30/2023  2:30 PM [facility] None      This document was completed utilizing speech recognition software. Grammatical errors, random word insertions, pronoun errors, and incomplete sentences are an occasional consequence of this system due to software  limitations, ambient noise, and hardware issues. Any formal questions or concerns about the content, text or information contained within the body of this dictation should be directly addressed to the provider for clarification.

## 2023-05-30 NOTE — Assessment & Plan Note (Addendum)
 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  Concern for lymph node involvement based on CT scan.   - On 04/04/2023, Dr. Rollin performed upper EUS.  It was at least T3, N 1/2, MX by endosonographic criteria.   -Staging PET scan on 04/07/2023 showed distal esophageal/GE junction adenocarcinoma.  Lower paraesophageal nodal metastasis.  No evidence of distant metastatic disease.  -Referral sent to cardiothoracic surgeon Dr. Shyrl and to Dr. Dewey, radiation oncologist for concurrent chemoradiation planning in the neoadjuvant setting.  -He started radiation treatments under the direction of Dr. Dewey from 04/21/2023.  He started chemotherapy with carboplatin  and paclitaxel  from 04/25/2023.    -Due for dose # 6 of carboplatin  and paclitaxel  today.  Labs all were reviewed leukopenia with white count of 2000, ANC 1400.  Platelet count improved at 117,000 today.  Hemoglobin 11.4  We will proceed with chemo today but continue with Carboplatin  AUC 1.5 dose. He will return next week for 2 days for neupogen .   -Previously ordered additional testing on biopsy specimen for potential targeted therapy options including MMR testing, PD-L1 testing  -Plan for potential surgery following chemoradiation, pending response assessment to treatment. He has appt coming up with Dr.Lightfoot on 06/13/23.  -He was advised to continue omeprazole daily.  - Depending on surgery outcome, we will plan for adjuvant Nivolumab  for up to one year. This was briefly discussed with patient today.

## 2023-05-30 NOTE — Assessment & Plan Note (Signed)
-   Leukopenia with white count of 2000, ANC of 1400.  Will proceed with chemo today as scheduled and give him Neupogen  for 2 days next week.  -Since this is his last of the planned chemotherapy, we expect blood counts to slowly improve over the next few weeks.

## 2023-05-30 NOTE — Assessment & Plan Note (Signed)
-  Platelet count 117,000 today. Since it is above 100,000, we proceeded with chemo today.

## 2023-06-02 ENCOUNTER — Telehealth: Payer: Self-pay | Admitting: Oncology

## 2023-06-02 ENCOUNTER — Encounter: Payer: Self-pay | Admitting: Medical Oncology

## 2023-06-02 ENCOUNTER — Telehealth: Payer: Self-pay | Admitting: Medical Oncology

## 2023-06-02 NOTE — Telephone Encounter (Signed)
 Injection appts scheduled.

## 2023-06-02 NOTE — Radiation Completion Notes (Addendum)
  Radiation Oncology         [phone] ________________________________  Name: Seth Burch MRN: [id number]  Date of Service: 05/30/2023  DOB: [birth year]  End of Treatment Note   Diagnosis:  Locally Advanced Adenocarcinoma of the Distal esophagus overlapping into the gastric cardia   Intent: Curative     ==========DELIVERED PLANS==========  First Treatment Date: 2023-04-21 Last Treatment Date: 2023-05-30   Plan Name: Esoph Site: Esophagus Technique: IMRT Mode: Photon Dose Per Fraction: 1.8 Gy Prescribed Dose (Delivered / Prescribed): 45 Gy / 45 Gy Prescribed Fxs (Delivered / Prescribed): 25 / 25   Plan Name: Esoph_Bst Site: Esophagus Technique: IMRT Mode: Photon Dose Per Fraction: 1.8 Gy Prescribed Dose (Delivered / Prescribed): 5.4 Gy / 5.4 Gy Prescribed Fxs (Delivered / Prescribed): 3 / 3     ==========ON TREATMENT VISIT DATES========== 2023-04-25, 2023-05-02, 2023-05-09, 2023-05-16, 2023-05-23, 2023-05-30    See weekly On Treatment Notes in Epic for details in the Media tab (listed as Progress notes on the On Treatment Visit Dates listed above).The patient tolerated radiation. He developed fatigue though he had not complained of esophagitis at the conclusion of treatment.  The patient will receive a call in about one month from the radiation oncology department. He will continue follow up with Dr. Autumn as well as with Dr. Shyrl.      Donald KYM Husband, PAC

## 2023-06-02 NOTE — Telephone Encounter (Signed)
 Left patient a voicemail in regards to scheduled appointment times/dates for injection appointment; left callback number in scheduling if needed for rescheduling

## 2023-06-03 ENCOUNTER — Other Ambulatory Visit: Payer: Self-pay

## 2023-06-05 ENCOUNTER — Inpatient Hospital Stay: Payer: 59

## 2023-06-05 DIAGNOSIS — C155 Malignant neoplasm of lower third of esophagus: Secondary | ICD-10-CM | POA: Diagnosis not present

## 2023-06-05 DIAGNOSIS — Z95828 Presence of other vascular implants and grafts: Secondary | ICD-10-CM

## 2023-06-05 MED ORDER — FILGRASTIM-SNDZ 300 MCG/0.5ML IJ SOSY
300.0000 ug | PREFILLED_SYRINGE | Freq: Every day | INTRAMUSCULAR | Status: DC
Start: 2023-06-05 — End: 2023-06-05
  Administered 2023-06-05: 300 ug via SUBCUTANEOUS
  Filled 2023-06-05: qty 0.5

## 2023-06-06 ENCOUNTER — Inpatient Hospital Stay: Payer: 59

## 2023-06-06 VITALS — BP 123/78 | HR 95 | Temp 98.2°F | Resp 20

## 2023-06-06 DIAGNOSIS — C155 Malignant neoplasm of lower third of esophagus: Secondary | ICD-10-CM | POA: Diagnosis not present

## 2023-06-06 DIAGNOSIS — Z95828 Presence of other vascular implants and grafts: Secondary | ICD-10-CM

## 2023-06-06 MED ORDER — FILGRASTIM-SNDZ 300 MCG/0.5ML IJ SOSY
300.0000 ug | PREFILLED_SYRINGE | Freq: Every day | INTRAMUSCULAR | Status: DC
  Administered 2023-06-06: 300 ug via SUBCUTANEOUS
  Filled 2023-06-06: qty 0.5

## 2023-06-12 ENCOUNTER — Encounter: Payer: 59 | Attending: Thoracic Surgery (Cardiothoracic Vascular Surgery) | Admitting: Dietician

## 2023-06-12 ENCOUNTER — Encounter: Payer: Self-pay | Admitting: Dietician

## 2023-06-12 VITALS — Ht 70.0 in | Wt 182.0 lb

## 2023-06-12 DIAGNOSIS — E118 Type 2 diabetes mellitus with unspecified complications: Secondary | ICD-10-CM | POA: Diagnosis present

## 2023-06-12 NOTE — Progress Notes (Signed)
Medical Nutrition Therapy  Appointment Start time:  1355  Appointment End time:  1500 Patient is here today with his wife. Primary concerns today: fasting blood glucose 140-200.  Expect goal is improve nutritional status and improve blood glucose. He has some weeks that he cannot eat well due to treatment and difficulty swallowing.  Sometimes liquids, instant mashed potatoes, frozen mac and cheese, sometimes bread No difficulty swallowing or eating this week.   Referral diagnosis: Esophageal cancer Preferred learning style: no preference indicated Learning readiness: ready, change in progress   NUTRITION ASSESSMENT  67" 182 lbs 06/12/23 UBW 230 lbs 05/2022 He states that his weight has been stable since 03/2023.  Clinical Medical Hx: Type 2 Diabetes, HTN, Esophageal cancer stage 3/4 and has upcoming surgery, s/p chemo and radiation Medications: Lantus 26 units q am, Jardiance, Metformin, Labs: glucose 173, BUN 24, Creatinine 1.34, Potassium 4.3, eGFR 58 05/30/2023 Notable Signs/Symptoms: see above  Lifestyle & Dietary Hx Patient lives with his wife and 22 yo granddaughter.  His wife does the shopping and cooking.  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  Supplements: occasional Vitamin C, occasional Vitamin D3 Sleep: fair, not used to being on disability and gets his days and nights mixed up. Stress / self-care: fair Current average weekly physical activity: limited - walking 4 days per week from 30-120 minutes - generally in stores  24-Hr Dietary Recall First Meal: missed today OR homemade waffles, sugar free syrup, bacon (2 strips) Snack: twix bar Second Meal: Kung Pao chicken, white rice, egg roll Snack: fruit Third Meal: chili beans, corn bread, angel food cake with berries Snack: popcorn, [place] Beverages: water, Sugar free soda, 8 oz OJ, occasional coffee with regular creamer and splenda, occasional sugar free Carnation Instant  Breakfast, 2% milk  Estimated Energy Needs Calories: 2500 Protein: 100g  NUTRITION DIAGNOSIS  NB-1.1 Food and nutrition-related knowledge deficit As related to balance of carbohydrate, protein and fat.  As evidenced by patient report.   NUTRITION INTERVENTION  Nutrition education (E-1) on the following topics:  Discussed importance of blood glucose control for upcoming surgery and benefits for wound healing Discussed his goal to gain weight.  As he eats more normally he should gain.  He should focus on nutritionally quality snacks, consistent meals, and supplements Dysphagia symptoms, tips to eat to avoid further problems Brief assessment of nutrition.  Handouts Provided Include  Esophageal Surgery Nutrition Therapy How to Thrive:  A Guide for Your Journey with Diabetes by the ADA Snack list  Learning Style & Readiness for Change Teaching method utilized: Visual & Auditory  Demonstrated degree of understanding via: Teach Back  Barriers to learning/adherence to lifestyle change: health  Goals Established by Pt Goals: Blood glucose control Weight gain Maintain/improve nutritional status  Avoid swallowing issues/adjust diet based on swallowing.    Regularly scheduled meals and snacks/shakes Avoid juice or keep to 4 oz unless you are unable to eat. Choose soft protein with each meal and snack Continue to stay active as you feel able.   Snack ideas: (base on how well you can swallow)   Cottage cheese and fruit Sugar free Carnation Breakfast with 2% milk (peanut butter, banana, other fruit optional) - or Glucerna or Boost glucose control Fruit and cheese Protein bar (15 grams carbs) and milk Hummus with crackers or vegetables Peanut butter with whole grain crackers, milk   MONITORING & EVALUATION Dietary intake, weekly physical activity, and weight prn.  Next Steps  Patient is to call for questions.

## 2023-06-12 NOTE — Patient Instructions (Addendum)
Goals: Blood glucose control Weight gain Maintain/improve nutritional status  Avoid swallowing issues/adjust diet based on swallowing.    Regularly scheduled meals and snacks/shakes Avoid juice or keep to 4 oz unless you are unable to eat. Choose soft protein with each meal and snack Continue to stay active as you feel able.   Snack ideas: (base on how well you can swallow)   Cottage cheese and fruit Sugar free Carnation Breakfast with 2% milk (peanut butter, banana, other fruit optional) - or Glucerna or Boost glucose control Fruit and cheese Protein bar (15 grams carbs) and milk Hummus with crackers or vegetables Peanut butter with whole grain crackers, milk

## 2023-06-13 ENCOUNTER — Other Ambulatory Visit: Payer: Self-pay | Admitting: Thoracic Surgery (Cardiothoracic Vascular Surgery)

## 2023-06-13 ENCOUNTER — Ambulatory Visit (INDEPENDENT_AMBULATORY_CARE_PROVIDER_SITE_OTHER): Payer: 59 | Admitting: Thoracic Surgery (Cardiothoracic Vascular Surgery)

## 2023-06-13 DIAGNOSIS — C155 Malignant neoplasm of lower third of esophagus: Secondary | ICD-10-CM

## 2023-06-13 NOTE — Progress Notes (Signed)
     [address]       [name] [address]             [phone]       Patient: Home Provider: Office Consent for Telemedicine visit obtained.  Today's visit was completed via a real-time telehealth (see specific modality noted below). The patient/authorized person provided oral consent at the time of the visit to engage in a telemedicine encounter with the present provider at [facility]. The patient/authorized person was informed of the potential benefits, limitations, and risks of telemedicine. The patient/authorized person expressed understanding that the laws that protect confidentiality also apply to telemedicine. The patient/authorized person acknowledged understanding that telemedicine does not provide emergency services and that he or she would need to call 911 or proceed to the nearest hospital for help if such a need arose.   Total time spent in the clinical discussion 20 minutes.  Telehealth Modality: Phone visit (audio only)  I had a telephone visit with [name].  Overall, he is doing well.  He tolerated and completed his neoadjuvant therapy.  He has gained 4lbs     Chemo completion date: 05/30/23 Radiation completion date: 05/30/23   Plan: repeat PET/CT 4 weeks from radiation.  Surgery in early March.

## 2023-06-30 ENCOUNTER — Ambulatory Visit
Admission: RE | Admit: 2023-06-30 | Discharge: 2023-06-30 | Disposition: A | Discharge status: Home / Self Care | Payer: 59 | Source: Ambulatory Visit | Attending: Radiation Oncology | Admitting: Radiation Oncology

## 2023-06-30 ENCOUNTER — Ambulatory Visit (HOSPITAL_COMMUNITY)
Admission: RE | Admit: 2023-06-30 | Discharge: 2023-06-30 | Disposition: A | Discharge status: Home / Self Care | Payer: 59 | Source: Ambulatory Visit | Attending: Thoracic Surgery (Cardiothoracic Vascular Surgery) | Admitting: Thoracic Surgery (Cardiothoracic Vascular Surgery)

## 2023-06-30 DIAGNOSIS — C155 Malignant neoplasm of lower third of esophagus: Secondary | ICD-10-CM | POA: Insufficient documentation

## 2023-06-30 LAB — GLUCOSE, CAPILLARY: Glucose-Capillary: 188 mg/dL — ABNORMAL HIGH (ref 70–99)

## 2023-06-30 MED ORDER — FLUDEOXYGLUCOSE F - 18 (FDG) INJECTION
9.1000 | Freq: Once | INTRAVENOUS | Status: AC
  Administered 2023-06-30: 8.35 via INTRAVENOUS

## 2023-06-30 NOTE — Progress Notes (Signed)
  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   Symptoms of fatigue have improved since completing therapy.  Symptoms of skin changes have improved since completing therapy.  Symptoms of difficulty swallowing have improved since completing therapy.   The patient has scheduled follow up with Dr. Randye Buttner as well as with Dr. Deloise Ferries  for ongoing surveillance, and with Dr. Lurena Sally. The patient was encouraged to call if he  develops concerns or questions regarding radiation.   This concludes the interaction.  Avery Bodo, LPN

## 2023-07-02 ENCOUNTER — Other Ambulatory Visit: Payer: Self-pay

## 2023-07-02 DIAGNOSIS — C155 Malignant neoplasm of lower third of esophagus: Secondary | ICD-10-CM

## 2023-07-03 ENCOUNTER — Inpatient Hospital Stay: Payer: 59 | Attending: Oncology

## 2023-07-03 ENCOUNTER — Inpatient Hospital Stay: Payer: 59 | Admitting: Oncology

## 2023-07-03 ENCOUNTER — Encounter: Payer: Self-pay | Admitting: Oncology

## 2023-07-03 VITALS — BP 133/78 | HR 72 | Temp 97.5°F | Resp 16 | Ht 70.0 in | Wt 176.7 lb

## 2023-07-03 DIAGNOSIS — T451X5A Adverse effect of antineoplastic and immunosuppressive drugs, initial encounter: Secondary | ICD-10-CM

## 2023-07-03 DIAGNOSIS — E119 Type 2 diabetes mellitus without complications: Secondary | ICD-10-CM | POA: Insufficient documentation

## 2023-07-03 DIAGNOSIS — I10 Essential (primary) hypertension: Secondary | ICD-10-CM | POA: Insufficient documentation

## 2023-07-03 DIAGNOSIS — N400 Enlarged prostate without lower urinary tract symptoms: Secondary | ICD-10-CM | POA: Diagnosis not present

## 2023-07-03 DIAGNOSIS — D472 Monoclonal gammopathy: Secondary | ICD-10-CM

## 2023-07-03 DIAGNOSIS — Z79899 Other long term (current) drug therapy: Secondary | ICD-10-CM | POA: Diagnosis not present

## 2023-07-03 DIAGNOSIS — I700 Atherosclerosis of aorta: Secondary | ICD-10-CM | POA: Insufficient documentation

## 2023-07-03 DIAGNOSIS — D6959 Other secondary thrombocytopenia: Secondary | ICD-10-CM

## 2023-07-03 DIAGNOSIS — R202 Paresthesia of skin: Secondary | ICD-10-CM | POA: Insufficient documentation

## 2023-07-03 DIAGNOSIS — C155 Malignant neoplasm of lower third of esophagus: Secondary | ICD-10-CM | POA: Insufficient documentation

## 2023-07-03 DIAGNOSIS — D701 Agranulocytosis secondary to cancer chemotherapy: Secondary | ICD-10-CM | POA: Diagnosis not present

## 2023-07-03 DIAGNOSIS — Z95828 Presence of other vascular implants and grafts: Secondary | ICD-10-CM

## 2023-07-03 DIAGNOSIS — R112 Nausea with vomiting, unspecified: Secondary | ICD-10-CM | POA: Diagnosis not present

## 2023-07-03 DIAGNOSIS — G629 Polyneuropathy, unspecified: Secondary | ICD-10-CM | POA: Diagnosis not present

## 2023-07-03 DIAGNOSIS — E785 Hyperlipidemia, unspecified: Secondary | ICD-10-CM | POA: Insufficient documentation

## 2023-07-03 DIAGNOSIS — I708 Atherosclerosis of other arteries: Secondary | ICD-10-CM | POA: Diagnosis not present

## 2023-07-03 DIAGNOSIS — D72819 Decreased white blood cell count, unspecified: Secondary | ICD-10-CM | POA: Diagnosis not present

## 2023-07-03 DIAGNOSIS — Z923 Personal history of irradiation: Secondary | ICD-10-CM | POA: Insufficient documentation

## 2023-07-03 LAB — CMP (CANCER CENTER ONLY)
ALT: 23 U/L (ref 0–44)
AST: 28 U/L (ref 15–41)
Albumin: 4.2 g/dL (ref 3.5–5.0)
Alkaline Phosphatase: 117 U/L (ref 38–126)
Anion gap: 4 — ABNORMAL LOW (ref 5–15)
BUN: 21 mg/dL (ref 8–23)
CO2: 27 mmol/L (ref 22–32)
Calcium: 9.2 mg/dL (ref 8.9–10.3)
Chloride: 109 mmol/L (ref 98–111)
Creatinine: 1.35 mg/dL — ABNORMAL HIGH (ref 0.61–1.24)
GFR, Estimated: 58 mL/min — ABNORMAL LOW (ref >60)
Glucose, Bld: 147 mg/dL — ABNORMAL HIGH (ref 70–99)
Potassium: 4 mmol/L (ref 3.5–5.1)
Sodium: 140 mmol/L (ref 135–145)
Total Bilirubin: 0.6 mg/dL (ref 0.0–1.2)
Total Protein: 7.3 g/dL (ref 6.5–8.1)

## 2023-07-03 LAB — CBC WITH DIFFERENTIAL (CANCER CENTER ONLY)
Abs Immature Granulocytes: 0.01 10*3/uL (ref 0.00–0.07)
Basophils Absolute: 0.1 10*3/uL (ref 0.0–0.1)
Basophils Relative: 2 %
Eosinophils Absolute: 0.1 10*3/uL (ref 0.0–0.5)
Eosinophils Relative: 2 %
HCT: 37 % — ABNORMAL LOW (ref 39.0–52.0)
Hemoglobin: 12.4 g/dL — ABNORMAL LOW (ref 13.0–17.0)
Immature Granulocytes: 0 %
Lymphocytes Relative: 41 %
Lymphs Abs: 2.1 10*3/uL (ref 0.7–4.0)
MCH: 31.3 pg (ref 26.0–34.0)
MCHC: 33.5 g/dL (ref 30.0–36.0)
MCV: 93.4 fL (ref 80.0–100.0)
Monocytes Absolute: 0.6 10*3/uL (ref 0.1–1.0)
Monocytes Relative: 12 %
Neutro Abs: 2.1 10*3/uL (ref 1.7–7.7)
Neutrophils Relative %: 43 %
Platelet Count: 195 10*3/uL (ref 150–400)
RBC: 3.96 MIL/uL — ABNORMAL LOW (ref 4.22–5.81)
RDW: 19.8 % — ABNORMAL HIGH (ref 11.5–15.5)
WBC Count: 5 10*3/uL (ref 4.0–10.5)
nRBC: 0 % (ref 0.0–0.2)

## 2023-07-03 LAB — MAGNESIUM: Magnesium: 2.1 mg/dL (ref 1.7–2.4)

## 2023-07-03 MED ORDER — SODIUM CHLORIDE 0.9% FLUSH
10.0000 mL | Freq: Once | INTRAVENOUS | Status: AC
  Administered 2023-07-03: 10 mL

## 2023-07-03 MED ORDER — HEPARIN SOD (PORK) LOCK FLUSH 100 UNIT/ML IV SOLN
500.0000 [IU] | Freq: Once | INTRAVENOUS | Status: AC
  Administered 2023-07-03: 500 [IU]

## 2023-07-03 NOTE — Assessment & Plan Note (Signed)
This has resolved now.

## 2023-07-03 NOTE — Assessment & Plan Note (Signed)
Please review oncology history for additional details.  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  Concern for lymph node involvement based on CT scan.   - On 04/04/2023, Dr. Elnoria Howard performed upper EUS.  It was at least T3, N 1/2, MX by endosonographic criteria.   -Previously ordered additional testing on biopsy specimen for potential targeted therapy options including MMR testing, PD-L1 testing  -Staging PET scan on 04/07/2023 showed distal esophageal/GE junction adenocarcinoma.  Lower paraesophageal nodal metastasis.  No evidence of distant metastatic disease.  -Referral sent to cardiothoracic surgeon Dr. Cliffton Asters and to Dr. Mitzi Hansen, radiation oncologist for concurrent chemoradiation planning in the neoadjuvant setting.  -He started radiation treatments under the direction of Dr. Mitzi Hansen from 04/21/2023.  He started chemotherapy with carboplatin and paclitaxel from 04/25/2023.  Completed last dose of chemotherapy on 05/30/2023.  -Restaging PET scan from 06/30/2023 showed excellent response with substantial interval improvement in hypermetabolic activity within the distal esophagus, indicating a positive response to therapy.  FDG activity improved from 26 to 5.7, suggesting decreased tumor activity. No residual or progressive nodal disease or signs of disease elsewhere.   -He has had consultation with Dr. Cliffton Asters previously.  Surgery planned for early March.   - Schedule follow-up appointment 3-4 weeks post-surgery to assess recovery and plan further treatment. Depending on surgery outcome, we will plan for adjuvant Nivolumab for up to one year. This was briefly discussed with patient today.   -He was advised to continue omeprazole daily.

## 2023-07-03 NOTE — Assessment & Plan Note (Signed)
-  He has been on surveillance for IgG kappa MGUS. [name]  Repeat myeloma labs from 03/27/2023 showed M protein of 0.6 g/dL, stable.  IgG kappa type on IFE.  Kappa/lambda light chain ratio stable at 5.24.  Overall picture consistent with MGUS.  Will continue surveillance alone.

## 2023-07-03 NOTE — Progress Notes (Signed)
[facility]  ONCOLOGY CLINIC PROGRESS NOTE   Patient Name:  Seth Burch  MR # [id number]  DOB: 04-20-1956   Patient Care Team: Shelle Iron, MD as PCP - General (Family Medicine) Misenheimer, Marcial Pacas, MD as Consulting Physician (Gastroenterology) Meryl Crutch, MD as Consulting Physician (Hematology and Oncology)  Date of visit: 07/03/2023   ASSESSMENT & PLAN:   68 y.o. 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  Primary adenocarcinoma of distal third of esophagus [facility]) Please review oncology history for additional details.  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  Concern for lymph node involvement based on CT scan.   - On 04/04/2023, Dr. Elnoria Howard performed upper EUS.  It was at least T3, N 1/2, MX by endosonographic criteria.   -Previously ordered additional testing on biopsy specimen for potential targeted therapy options including MMR testing, PD-L1 testing  -Staging PET scan on 04/07/2023 showed distal esophageal/GE junction adenocarcinoma.  Lower paraesophageal nodal metastasis.  No evidence of distant metastatic disease.  -Referral sent to cardiothoracic surgeon Dr. Cliffton Asters and to Dr. Mitzi Hansen, radiation oncologist for concurrent chemoradiation planning in the neoadjuvant setting.  -He started radiation treatments under the direction of Dr. Mitzi Hansen from 04/21/2023.  He started chemotherapy with carboplatin and paclitaxel from 04/25/2023.  Completed last dose of chemotherapy on 05/30/2023.  -Restaging PET scan from 06/30/2023 showed excellent response with substantial interval improvement in hypermetabolic activity within the distal esophagus, indicating a positive response to therapy.  FDG activity improved from 26 to 5.7, suggesting  decreased tumor activity. No residual or progressive nodal disease or signs of disease elsewhere.   -He has had consultation with Dr. Cliffton Asters previously.  Surgery planned for early March.   - Schedule follow-up appointment 3-4 weeks post-surgery to assess recovery and plan further treatment. Depending on surgery outcome, we will plan for adjuvant Nivolumab for up to one year. This was briefly discussed with patient today.   -He was advised to continue omeprazole daily.  Leukopenia due to antineoplastic chemotherapy [facility]) This has resolved now.  Chemotherapy-induced thrombocytopenia This has resolved now.  MGUS (monoclonal gammopathy of unknown significance) -He has been on surveillance for IgG kappa MGUS. [name]  Repeat myeloma labs from 03/27/2023 showed M protein of 0.6 g/dL, stable.  IgG kappa type on IFE.  Kappa/lambda light chain ratio stable at 5.24.  Overall picture consistent with MGUS.  Will continue surveillance alone.    I reviewed lab results and outside records for this visit and discussed relevant results with the patient. Diagnosis, plan of care and treatment options were also discussed in detail with the patient. Opportunity provided to ask questions and answers provided to his apparent satisfaction. Provided instructions to call our clinic with any problems, questions or concerns prior to return visit. I recommended to continue follow-up with PCP and sub-specialists. He verbalized understanding and agreed with the plan.   NCCN guidelines have been consulted in the planning of this patient's care.  I provided 30 minutes of face-to-face time during this encounter and > 50% was spent counseling as documented under my assessment and plan.    Meryl Crutch, MD  07/03/2023 3:46 PM  [facility] - A DEPT OF [facility] 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  Radiation treatment started from 04/21/2023.  Chemotherapy started from 04/25/2023 and received last dose on 05/30/2023.  INTERVAL HISTORY:    Discussed the use of AI scribe software for clinical note transcription with the patient, who gave verbal consent to proceed.   Seth Burch is here today for repeat clinical assessment.   He reports no difficulty swallowing and denies any lingering side effects from the chemotherapy such as nausea, vomiting, tingling, or numbness. He also denies any oral symptoms. 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 He recently had a PET scan, but the results were not available at the time of the visit. He is also under the care of Dr. Cliffton Asters, who he has already seen.  I have reviewed the past medical history, past surgical history, social history and family history with the patient and they are unchanged from previous note.  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 Jennye Boroughs after he presented to his PCP with complaints of dysphagia and weight loss.  He started noticing difficulty in swallowing in January or February 2024.  He also reported unintentional weight loss of about 20 pounds in 1 year.  Previously he used to take ranitidine for intermittent acid reflux but it was stopped approximately in 2018.  He was started on omeprazole and an EGD was planned for further evaluation.  Also screening colonoscopy was recommended since last colonoscopy was in 2012.   03/11/2023 Pathology Results   Biopsy of stomach/gastric  cardia: Cardiac mucosa showing at least intramucosal adenocarcinoma.  Biopsy of esophageal mass: Invasive, moderately differentiated adenocarcinoma.   03/11/2023 Procedure   EGD with biopsy by Dr.Misenheimer: Large mass in the distal esophagus between 30 and 38 cm.  Probable infiltration of the mass in the gastric cardia.  Colonoscopy on 03/11/2023: Mild diverticular disease in the left colon.   03/14/2023 Imaging   CT chest, abdomen and pelvis: Circumferential, masslike thickening in the lower third of esophagus, 7 cm in length, consistent with known primary esophageal malignancy.  Enlarged right paraesophageal lymph node measuring 1.2 x 0.8 cm, consistent with nodal metastatic disease.  No other evidence of lymphadenopathy or metastatic disease in the chest, abdomen or pelvis.  Background of very fine centrilobular pulmonary nodules, most concentrated in the lung apices as well as mild diffuse bronchial wall thickening, most commonly seen in smoking-related respiratory bronchiolitis.  Prostatomegaly.   04/04/2023 Procedure   EUS by Dr.Hung: cT3 or cT4, N1/N2,Mx disease.   04/07/2023 PET scan   PET scan: distal esophageal/GE junction adenocarcinoma.  Lower paraesophageal nodal metastasis.  No evidence of distant metastatic disease.   04/24/2023 Cancer Staging   Staging form: Esophagus - Adenocarcinoma, AJCC 8th Edition - Clinical: Stage III (cT3, cN1, cM0, G2) - Signed by Meryl Crutch, MD on 04/24/2023 Histologic grading system: 3 grade system   04/25/2023 -  Chemotherapy   Patient is on Treatment Plan : ESOPHAGUS Carboplatin + Paclitaxel Weekly X 6 Weeks with XRT     06/30/2023 PET scan   Substantial interval improvement in hypermetabolic activity within the distal esophagus, consistent  with response to therapy. No residual or progressive adjacent nodal disease demonstrated. No evidence of metastatic disease.       REVIEW OF SYSTEMS:   Review of Systems - Oncology  All other  pertinent systems were reviewed with the patient and are negative.  ALLERGIES: He is allergic to no known allergies.  MEDICATIONS:  Current Outpatient Medications  Medication Sig Dispense Refill   ACCU-CHEK GUIDE test strip 1 each by Other route 2 (two) times daily.     amLODipine (NORVASC) 5 MG tablet Take 1 tablet by mouth daily.     Aspirin 81 MG CAPS Take 81 mg by mouth daily.     atorvastatin (LIPITOR) 20 MG tablet Take 1 tablet by mouth daily.     JARDIANCE 10 MG TABS tablet Take 1 tablet by mouth daily.     LANTUS SOLOSTAR 100 UNIT/ML Solostar Pen Inject 26 Units into the skin daily.     lidocaine-prilocaine (EMLA) cream Apply to affected area once 30 g 3   metFORMIN (GLUCOPHAGE) 500 MG tablet Take 2 tablets by mouth daily.     omeprazole (PRILOSEC) 40 MG capsule Take 40 mg by mouth daily.     ramipril (ALTACE) 10 MG capsule Take 2 capsules by mouth daily.     tamsulosin (FLOMAX) 0.4 MG CAPS capsule Take 1 capsule by mouth daily.     dexamethasone (DECADRON) 4 MG tablet Take 2 tablets daily for 2 days, start the day after chemotherapy. Take with food. (Patient not taking: Reported on 07/03/2023) 30 tablet 1   ondansetron (ZOFRAN) 8 MG tablet Take 1 tablet (8 mg total) by mouth every 8 (eight) hours as needed for nausea or vomiting. Start on the third day after chemotherapy. (Patient not taking: Reported on 07/03/2023) 30 tablet 1   prochlorperazine (COMPAZINE) 10 MG tablet Take 1 tablet (10 mg total) by mouth every 6 (six) hours as needed for nausea or vomiting. (Patient not taking: Reported on 07/03/2023) 30 tablet 1   No current facility-administered medications for this visit.     VITALS:   Blood pressure 133/78, pulse 72, temperature (!) 97.5 F (36.4 C), temperature source Temporal, resp. rate 16, height 5\' 10"  (1.778 m), weight 176 lb 11.2 oz (80.2 kg), SpO2 99%.  Wt Readings from Last 3 Encounters:  07/03/23 176 lb 11.2 oz (80.2 kg)  06/30/23 175 lb (79.4 kg)  06/12/23 182  lb (82.6 kg)    Body mass index is 25.35 kg/m.   Onc Performance Status - 07/03/23 1137       ECOG Perf Status   ECOG Perf Status Fully active, able to carry on all pre-disease performance without restriction      KPS SCALE   KPS % SCORE Normal, no compliants, no evidence of disease             PHYSICAL EXAM:   Physical Exam Constitutional:      General: He is not in acute distress.    Appearance: Normal appearance.  HENT:     Head: Normocephalic and atraumatic.  Eyes:     General: No scleral icterus.    Conjunctiva/sclera: Conjunctivae normal.  Cardiovascular:     Rate and Rhythm: Normal rate and regular rhythm.     Heart sounds: Normal heart sounds.  Pulmonary:     Effort: Pulmonary effort is normal.     Breath sounds: Normal breath sounds.  Chest:     Comments: Right-sided Port-A-Cath in place without any signs of infection Abdominal:  General: There is no distension.  Musculoskeletal:     Right lower leg: No edema.     Left lower leg: No edema.  Neurological:     General: No focal deficit present.     Mental Status: He is alert and oriented to person, place, and time.  Psychiatric:        Mood and Affect: Mood normal.        Behavior: Behavior normal.        Thought Content: Thought content normal.      LABORATORY DATA:   I have reviewed the data as listed.   Results for orders placed or performed in visit on 07/03/23 (from the past 72 hours)  Magnesium     Status: None   Collection Time: 07/03/23 11:05 AM  Result Value Ref Range   Magnesium 2.1 1.7 - 2.4 mg/dL    Comment: Performed at [facility], [address], [place], [place] [address]  CMP (Cancer Center only)     Status: Abnormal   Collection Time: 07/03/23 11:05 AM  Result Value Ref Range   Sodium 140 135 - 145 mmol/L   Potassium 4.0 3.5 - 5.1 mmol/L   Chloride 109 98 - 111 mmol/L   CO2 27 22 - 32 mmol/L   Glucose, Bld 147 (H) 70 - 99 mg/dL    Comment:  Glucose reference range applies only to samples taken after fasting for at least 8 hours.   BUN 21 8 - 23 mg/dL   Creatinine 9.14 (H) 7.82 - 1.24 mg/dL   Calcium 9.2 8.9 - 95.6 mg/dL   Total Protein 7.3 6.5 - 8.1 g/dL   Albumin 4.2 3.5 - 5.0 g/dL   AST 28 15 - 41 U/L   ALT 23 0 - 44 U/L   Alkaline Phosphatase 117 38 - 126 U/L   Total Bilirubin 0.6 0.0 - 1.2 mg/dL   GFR, Estimated 58 (L) >60 mL/min    Comment: (NOTE) Calculated using the CKD-EPI Creatinine Equation (2021)    Anion gap 4 (L) 5 - 15    Comment: Performed at [facility], [address], [place], [place] [address]  CBC with Differential (Cancer Center Only)     Status: Abnormal   Collection Time: 07/03/23 11:05 AM  Result Value Ref Range   WBC Count 5.0 4.0 - 10.5 K/uL   RBC 3.96 (L) 4.22 - 5.81 MIL/uL   Hemoglobin 12.4 (L) 13.0 - 17.0 g/dL   HCT 65.7 (L) 84.6 - 96.2 %   MCV 93.4 80.0 - 100.0 fL   MCH 31.3 26.0 - 34.0 pg   MCHC 33.5 30.0 - 36.0 g/dL   RDW 95.2 (H) 84.1 - 32.4 %   Platelet Count 195 150 - 400 K/uL   nRBC 0.0 0.0 - 0.2 %   Neutrophils Relative % 43 %   Neutro Abs 2.1 1.7 - 7.7 K/uL   Lymphocytes Relative 41 %   Lymphs Abs 2.1 0.7 - 4.0 K/uL   Monocytes Relative 12 %   Monocytes Absolute 0.6 0.1 - 1.0 K/uL   Eosinophils Relative 2 %   Eosinophils Absolute 0.1 0.0 - 0.5 K/uL   Basophils Relative 2 %   Basophils Absolute 0.1 0.0 - 0.1 K/uL   Immature Granulocytes 0 %   Abs Immature Granulocytes 0.01 0.00 - 0.07 K/uL    Comment: Performed at [facility], [address], [place], [place] [address]  RADIOGRAPHIC STUDIES:  NM PET Image Restag (PS) Skull Base To Thigh Result Date: 07/03/2023 CLINICAL DATA:  Subsequent treatment strategy for esophageal cancer. Radiation and chemotherapy completed. EXAM: NUCLEAR MEDICINE PET SKULL BASE TO THIGH TECHNIQUE: 8.35 mCi F-18 FDG was injected intravenously. Full-ring PET imaging was performed from the  skull base to thigh after the radiotracer. CT data was obtained and used for attenuation correction and anatomic localization. Fasting blood glucose: 188 mg/dl COMPARISON:  PET-CT 16/02/9603.  Chest CT 03/14/2023. FINDINGS: Mediastinal blood pool activity: SUV max 2.8 NECK: No hypermetabolic cervical lymph nodes are identified. No suspicious activity identified within the pharyngeal mucosal space. Incidental CT findings: none CHEST: Previously demonstrated long segment hypermetabolic activity within the distal esophagus has substantially improved in the interval. This now has an SUV max of 5.7 (previously 26.3). There is residual distal esophageal wall thickening. No residual hypermetabolic paraesophageal lymph nodes. No new hypermetabolic mediastinal, hilar or axillary lymph nodes. No hypermetabolic pulmonary activity or suspicious nodularity. Incidental CT findings: Mild aortic atherosclerosis. Right IJ Port-A-Cath extends to the superior cavoatrial junction. ABDOMEN/PELVIS: There is no hypermetabolic activity within the liver, adrenal glands, spleen or pancreas. There is no hypermetabolic nodal activity in the abdomen or pelvis. Incidental CT findings: Unchanged left renal cysts for which no specific follow-up imaging is recommended. Stable prostatomegaly and prominent fat in both inguinal canals. Mild aortoiliac atherosclerosis. SKELETON: There is no hypermetabolic activity to suggest osseous metastatic disease. Incidental CT findings: Decreased marrow activity in the lower thoracic spine attributed to interval radiation therapy. Mild spondylosis. IMPRESSION: 1. Substantial interval improvement in hypermetabolic activity within the distal esophagus, consistent with response to therapy. No residual or progressive adjacent nodal disease demonstrated. 2. No evidence of metastatic disease. 3.  Aortic Atherosclerosis (ICD10-I70.0). Electronically Signed   By: Carey Bullocks M.D.   On: 07/03/2023 11:24     CODE  STATUS: Full code.   No orders of the defined types were placed in this encounter.    Future Appointments  Date Time Provider Department Center  07/11/2023  1:00 PM [name], MD [facility]  08/27/2023 10:15 AM [facility] None  08/27/2023 10:40 AM Tamirra Sienkiewicz, [name], MD [facility] None      This document was completed utilizing speech recognition software. Grammatical errors, random word insertions, pronoun errors, and incomplete sentences are an occasional consequence of this system due to software limitations, ambient noise, and hardware issues. Any formal questions or concerns about the content, text or information contained within the body of this dictation should be directly addressed to the provider for clarification.

## 2023-07-04 ENCOUNTER — Telehealth: Payer: Self-pay | Admitting: Oncology

## 2023-07-04 NOTE — Telephone Encounter (Signed)
Patient was already scheduled. Informed patient to disregard first message.

## 2023-07-05 ENCOUNTER — Other Ambulatory Visit: Payer: Self-pay

## 2023-07-11 ENCOUNTER — Other Ambulatory Visit: Payer: Self-pay | Admitting: Thoracic Surgery (Cardiothoracic Vascular Surgery)

## 2023-07-11 ENCOUNTER — Encounter: Payer: Self-pay | Admitting: *Deleted

## 2023-07-11 ENCOUNTER — Ambulatory Visit (INDEPENDENT_AMBULATORY_CARE_PROVIDER_SITE_OTHER): Payer: 59 | Admitting: Thoracic Surgery (Cardiothoracic Vascular Surgery)

## 2023-07-11 VITALS — BP 146/86 | HR 70 | Resp 18 | Ht 70.0 in | Wt 183.0 lb

## 2023-07-11 DIAGNOSIS — C155 Malignant neoplasm of lower third of esophagus: Secondary | ICD-10-CM

## 2023-07-11 DIAGNOSIS — Z01818 Encounter for other preprocedural examination: Secondary | ICD-10-CM

## 2023-07-11 NOTE — Progress Notes (Signed)
[address]       [place] [address]             [phone]                                                   CATRELL MORRONE [facility] Medical Record #[id number] Date of Birth: [birth year]   Referring: Meryl Crutch, MD Primary Care: Shelle Iron, MD Primary Cardiologist: None   Chief Complaint:        Chief Complaint  Patient presents with   Esophageal Cancer      Chest CT 11/11, PET 11/18, EUS 11/15      History of Present Illness:    Seth Burch 68 y.o. 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  He has had a 30lb weight loss over the last several months with progressive dysphagia.  Since have his EUS, he states that he has been able to eat much better.       Zubrod Score: At the time of surgery this patient's most appropriate activity status/level should be described as: [x]     0    Normal activity, no symptoms []     1    Restricted in physical strenuous activity but ambulatory, able to do out light work []     2    Ambulatory and capable of self care, unable to do work activities, up and about               >50 % of waking hours                              []     3    Only limited self care, in bed greater than 50% of waking hours []     4    Completely disabled, no self care, confined to bed or chair []     5    Moribund         Past Medical History:  Diagnosis Date   Diabetes mellitus (HCC)     Dyslipidemia     Hypertension                 Past Surgical History:  Procedure Laterality Date   ESOPHAGOGASTRODUODENOSCOPY (EGD) WITH PROPOFOL N/A 04/04/2023    Procedure: ESOPHAGOGASTRODUODENOSCOPY (EGD) WITH PROPOFOL;  Surgeon: Jeani Hawking, MD;  Location: [facility];  Service: Gastroenterology;  Laterality: N/A;   IR IMAGING GUIDED PORT INSERTION   04/09/2023   UPPER ESOPHAGEAL ENDOSCOPIC ULTRASOUND (EUS) N/A 04/04/2023    Procedure: UPPER ESOPHAGEAL ENDOSCOPIC ULTRASOUND  (EUS);  Surgeon: Jeani Hawking, MD;  Location: [facility];  Service: Gastroenterology;  Laterality: N/A;          No family history on file.         Tobacco Use History  Social History       Tobacco Use  Smoking Status Never  Smokeless Tobacco Never      Social History        Substance and Sexual Activity  Alcohol Use Yes   Alcohol/week: 1.0 standard drink of alcohol   Types: 1 Cans of beer per week        Allergies       Allergies  Allergen Reactions   No Known Allergies        Other Reaction(s): Not available              Current Outpatient Medications  Medication Sig Dispense Refill   ACCU-CHEK GUIDE test strip 1 each by Other route 2 (two) times daily.       amLODipine (NORVASC) 5 MG tablet Take 1 tablet by mouth daily.       Aspirin 81 MG CAPS Take 81 mg by mouth daily.       atorvastatin (LIPITOR) 20 MG tablet Take 1 tablet by mouth daily.       dexamethasone (DECADRON) 4 MG tablet Take 2 tablets daily for 2 days, start the day after chemotherapy. Take with food. 30 tablet 1   JARDIANCE 10 MG TABS tablet Take 1 tablet by mouth daily.       LANTUS SOLOSTAR 100 UNIT/ML Solostar Pen Inject 26 Units into the skin daily.       lidocaine-prilocaine (EMLA) cream Apply to affected area once 30 g 3   metFORMIN (GLUCOPHAGE) 500 MG tablet Take 2 tablets by mouth daily.       omeprazole (PRILOSEC) 40 MG capsule Take 40 mg by mouth daily.       ondansetron (ZOFRAN) 8 MG tablet Take 1 tablet (8 mg total) by mouth every 8 (eight) hours as needed for nausea or vomiting. Start on the third day after chemotherapy. 30 tablet 1   prochlorperazine (COMPAZINE) 10 MG tablet Take 1 tablet (10 mg total) by mouth every 6 (six) hours as needed for nausea or vomiting. 30 tablet 1   ramipril (ALTACE) 10 MG capsule Take 2 capsules by mouth daily.       tamsulosin (FLOMAX) 0.4 MG CAPS capsule Take 1 capsule by mouth daily.          No current facility-administered medications for  this visit.        Review of Systems  Constitutional:  Positive for malaise/fatigue and weight loss.  Respiratory:  Negative for cough and shortness of breath.   Cardiovascular:  Negative for chest pain.  Gastrointestinal:  Positive for abdominal pain and heartburn.  Neurological: Negative.         PHYSICAL EXAMINATION: BP (!) 151/81 (BP Location: Right Arm, Patient Position: Sitting, Cuff Size: Normal)   Pulse 76   Resp 20   Ht 5\' 10"  (1.778 m)   Wt 181 lb (82.1 kg)   SpO2 97% Comment: RA  BMI 25.97 kg/m  Physical Exam Constitutional:      General: He is not in acute distress.    Appearance: He is not ill-appearing.  HENT:     Head: Normocephalic and atraumatic.  Eyes:     Extraocular Movements: Extraocular movements intact.  Cardiovascular:     Rate and Rhythm: Normal rate.  Pulmonary:     Effort: Pulmonary effort is normal. No respiratory distress.  Abdominal:     General: Abdomen is flat. There is no distension.  Musculoskeletal:        General: Normal range of motion.     Cervical back: Normal range of motion.  Skin:    General: Skin is warm and dry.  Neurological:     General: No focal deficit present.     Mental Status: He is alert and oriented to person, place, and time.        Diagnostic Studies & Laboratory data:      PET/CT:   EGD/EUS: Findings: ENDOSCOPIC  FINDING:   A large, fungating mass with no bleeding and no stigmata of recent bleeding was found in the lower third of the esophagus, 33 cm from the incisors. The mass was non- obstructing and circumferential.   ENDOSONOGRAPHIC FINDING:   A hypoechoic mass was found in the lower third of the esophagus. The mass was encountered at 33 cm from the incisors and extended to 39 cm. The lesion was circumferential. The endosonographic borders were irregular. 50 mm. There was sonographic evidence suggesting invasion into the adventitia ( Layer 5) .   The esophageal cancer was noted to extend from 33- 39  cm. In the proximal portion it was 2/ 3rds circumfirential, but then it was circumfirential distally. There was no obstruction with the cancer. The cancer was friable. At its greatest depth it was measured to be approximately 50 mm, however, this was possibily a tagential view. The lesion was certainly 30 mm. The cancer involved the adventitia, but there was no invasion into the surrounding vascular structures. Two paraesophageal LNs were identified and they were pathologic appearing. In the subcarina there was evidence of an 11 mm LN, but it appeared shody and it was not amenable to FNA as it was a thin LN. In the viewed portions of the liver the was no evidence of any mets. The celiac axis appeared normal and there was no evidence of any LAD in that region. His pancreas exhibited hyperechoic strainding and some lobularity.   The staging is a T4N1/ Impression: - Malignant esophageal tumor was found in the lower third of the esophagus. - A mass was found in the lower third of the esophagus. Tissue has not been obtained. However, the endosonographic appearance is consistent with adenocarcinoma. This was staged T4 N1/ 2 Mx by endosonographic criteria. - No specimens collected.   Path: Cardia - adenocarcinoma, Esophagus moderately differentiated adenocarcinoma Radiation Hx: Began on 12/2       I have independently reviewed the above radiology studies  and reviewed the findings with the patient.         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  He has completed his neoadjuvant radiation and chemotherapy.  Most recent PET/CT shows a good response.  His nutritional labs show an albumin of 4.2.  He has done well with this.  He has been able to resume eating without much complications.  He has also been able to put on some weight.  We discussed the risks and benefits of robotic assisted Ivor Lewis esophagectomy.  He is agreeable to proceed.  He is scheduled for  early March.

## 2023-07-11 NOTE — H&P (View-Only) (Signed)
 [address]       [place] [address]             [phone]                                                   Seth Burch [facility] Medical Record #[id number] Date of Birth: [birth year]   Referring: Meryl Crutch, MD Primary Care: Shelle Iron, MD Primary Cardiologist: None   Chief Complaint:        Chief Complaint  Patient presents with   Esophageal Cancer      Chest CT 11/11, PET 11/18, EUS 11/15      History of Present Illness:    Seth Burch 68 y.o. 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  He has had a 30lb weight loss over the last several months with progressive dysphagia.  Since have his EUS, he states that he has been able to eat much better.       Zubrod Score: At the time of surgery this patient's most appropriate activity status/level should be described as: [x]     0    Normal activity, no symptoms []     1    Restricted in physical strenuous activity but ambulatory, able to do out light work []     2    Ambulatory and capable of self care, unable to do work activities, up and about               >50 % of waking hours                              []     3    Only limited self care, in bed greater than 50% of waking hours []     4    Completely disabled, no self care, confined to bed or chair []     5    Moribund         Past Medical History:  Diagnosis Date   Diabetes mellitus (HCC)     Dyslipidemia     Hypertension                 Past Surgical History:  Procedure Laterality Date   ESOPHAGOGASTRODUODENOSCOPY (EGD) WITH PROPOFOL N/A 04/04/2023    Procedure: ESOPHAGOGASTRODUODENOSCOPY (EGD) WITH PROPOFOL;  Surgeon: Jeani Hawking, MD;  Location: [facility];  Service: Gastroenterology;  Laterality: N/A;   IR IMAGING GUIDED PORT INSERTION   04/09/2023   UPPER ESOPHAGEAL ENDOSCOPIC ULTRASOUND (EUS) N/A 04/04/2023    Procedure: UPPER ESOPHAGEAL ENDOSCOPIC ULTRASOUND  (EUS);  Surgeon: Jeani Hawking, MD;  Location: [facility];  Service: Gastroenterology;  Laterality: N/A;          No family history on file.         Tobacco Use History  Social History       Tobacco Use  Smoking Status Never  Smokeless Tobacco Never      Social History        Substance and Sexual Activity  Alcohol Use Yes   Alcohol/week: 1.0 standard drink of alcohol   Types: 1 Cans of beer per week        Allergies       Allergies  Allergen Reactions   No Known Allergies        Other Reaction(s): Not available              Current Outpatient Medications  Medication Sig Dispense Refill   ACCU-CHEK GUIDE test strip 1 each by Other route 2 (two) times daily.       amLODipine (NORVASC) 5 MG tablet Take 1 tablet by mouth daily.       Aspirin 81 MG CAPS Take 81 mg by mouth daily.       atorvastatin (LIPITOR) 20 MG tablet Take 1 tablet by mouth daily.       dexamethasone (DECADRON) 4 MG tablet Take 2 tablets daily for 2 days, start the day after chemotherapy. Take with food. 30 tablet 1   JARDIANCE 10 MG TABS tablet Take 1 tablet by mouth daily.       LANTUS SOLOSTAR 100 UNIT/ML Solostar Pen Inject 26 Units into the skin daily.       lidocaine-prilocaine (EMLA) cream Apply to affected area once 30 g 3   metFORMIN (GLUCOPHAGE) 500 MG tablet Take 2 tablets by mouth daily.       omeprazole (PRILOSEC) 40 MG capsule Take 40 mg by mouth daily.       ondansetron (ZOFRAN) 8 MG tablet Take 1 tablet (8 mg total) by mouth every 8 (eight) hours as needed for nausea or vomiting. Start on the third day after chemotherapy. 30 tablet 1   prochlorperazine (COMPAZINE) 10 MG tablet Take 1 tablet (10 mg total) by mouth every 6 (six) hours as needed for nausea or vomiting. 30 tablet 1   ramipril (ALTACE) 10 MG capsule Take 2 capsules by mouth daily.       tamsulosin (FLOMAX) 0.4 MG CAPS capsule Take 1 capsule by mouth daily.          No current facility-administered medications for  this visit.        Review of Systems  Constitutional:  Positive for malaise/fatigue and weight loss.  Respiratory:  Negative for cough and shortness of breath.   Cardiovascular:  Negative for chest pain.  Gastrointestinal:  Positive for abdominal pain and heartburn.  Neurological: Negative.         PHYSICAL EXAMINATION: BP (!) 151/81 (BP Location: Right Arm, Patient Position: Sitting, Cuff Size: Normal)   Pulse 76   Resp 20   Ht 5\' 10"  (1.778 m)   Wt 181 lb (82.1 kg)   SpO2 97% Comment: RA  BMI 25.97 kg/m  Physical Exam Constitutional:      General: He is not in acute distress.    Appearance: He is not ill-appearing.  HENT:     Head: Normocephalic and atraumatic.  Eyes:     Extraocular Movements: Extraocular movements intact.  Cardiovascular:     Rate and Rhythm: Normal rate.  Pulmonary:     Effort: Pulmonary effort is normal. No respiratory distress.  Abdominal:     General: Abdomen is flat. There is no distension.  Musculoskeletal:        General: Normal range of motion.     Cervical back: Normal range of motion.  Skin:    General: Skin is warm and dry.  Neurological:     General: No focal deficit present.     Mental Status: He is alert and oriented to person, place, and time.        Diagnostic Studies & Laboratory data:      PET/CT:   EGD/EUS: Findings: ENDOSCOPIC  FINDING:   A large, fungating mass with no bleeding and no stigmata of recent bleeding was found in the lower third of the esophagus, 33 cm from the incisors. The mass was non- obstructing and circumferential.   ENDOSONOGRAPHIC FINDING:   A hypoechoic mass was found in the lower third of the esophagus. The mass was encountered at 33 cm from the incisors and extended to 39 cm. The lesion was circumferential. The endosonographic borders were irregular. 50 mm. There was sonographic evidence suggesting invasion into the adventitia ( Layer 5) .   The esophageal cancer was noted to extend from 33- 39  cm. In the proximal portion it was 2/ 3rds circumfirential, but then it was circumfirential distally. There was no obstruction with the cancer. The cancer was friable. At its greatest depth it was measured to be approximately 50 mm, however, this was possibily a tagential view. The lesion was certainly 30 mm. The cancer involved the adventitia, but there was no invasion into the surrounding vascular structures. Two paraesophageal LNs were identified and they were pathologic appearing. In the subcarina there was evidence of an 11 mm LN, but it appeared shody and it was not amenable to FNA as it was a thin LN. In the viewed portions of the liver the was no evidence of any mets. The celiac axis appeared normal and there was no evidence of any LAD in that region. His pancreas exhibited hyperechoic strainding and some lobularity.   The staging is a T4N1/ Impression: - Malignant esophageal tumor was found in the lower third of the esophagus. - A mass was found in the lower third of the esophagus. Tissue has not been obtained. However, the endosonographic appearance is consistent with adenocarcinoma. This was staged T4 N1/ 2 Mx by endosonographic criteria. - No specimens collected.   Path: Cardia - adenocarcinoma, Esophagus moderately differentiated adenocarcinoma Radiation Hx: Began on 12/2       I have independently reviewed the above radiology studies  and reviewed the findings with the patient.         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  He has completed his neoadjuvant radiation and chemotherapy.  Most recent PET/CT shows a good response.  His nutritional labs show an albumin of 4.2.  He has done well with this.  He has been able to resume eating without much complications.  He has also been able to put on some weight.  We discussed the risks and benefits of robotic assisted Ivor Lewis esophagectomy.  He is agreeable to proceed.  He is scheduled for  early March.

## 2023-07-16 ENCOUNTER — Other Ambulatory Visit: Payer: Self-pay

## 2023-07-16 ENCOUNTER — Telehealth: Payer: Self-pay | Admitting: *Deleted

## 2023-07-16 ENCOUNTER — Other Ambulatory Visit: Payer: Self-pay | Admitting: Thoracic Surgery (Cardiothoracic Vascular Surgery)

## 2023-07-16 ENCOUNTER — Encounter (HOSPITAL_COMMUNITY): Payer: Self-pay

## 2023-07-16 DIAGNOSIS — C155 Malignant neoplasm of lower third of esophagus: Secondary | ICD-10-CM

## 2023-07-16 NOTE — Telephone Encounter (Signed)
 STD paperwork faxed to [employer] at [phone]. Originals mailed to home address. Estimated RTW date of 10/20/23.

## 2023-07-18 ENCOUNTER — Ambulatory Visit (HOSPITAL_COMMUNITY): Payer: 59 | Attending: Internal Medicine

## 2023-07-18 DIAGNOSIS — Z01810 Encounter for preprocedural cardiovascular examination: Secondary | ICD-10-CM | POA: Diagnosis present

## 2023-07-18 DIAGNOSIS — Z01818 Encounter for other preprocedural examination: Secondary | ICD-10-CM | POA: Insufficient documentation

## 2023-07-18 LAB — MYOCARDIAL PERFUSION IMAGING
LV dias vol: 72 mL (ref 62–150)
LV sys vol: 28 mL
Nuc Stress EF: 62 %
Peak HR: 92 {beats}/min
Rest HR: 63 {beats}/min
Rest Nuclear Isotope Dose: 11 mCi
SDS: 1
SRS: 0
SSS: 1
ST Depression (mm): 0 mm
Stress Nuclear Isotope Dose: 32 mCi
TID: 1.05

## 2023-07-18 MED ORDER — REGADENOSON 0.4 MG/5ML IV SOLN
0.4000 mg | Freq: Once | INTRAVENOUS | Status: AC
Start: 2023-07-18 — End: 2023-07-18
  Administered 2023-07-18: 0.4 mg via INTRAVENOUS

## 2023-07-18 MED ORDER — TECHNETIUM TC 99M TETROFOSMIN IV KIT
11.0000 | PACK | Freq: Once | INTRAVENOUS | Status: AC | PRN
  Administered 2023-07-18: 11 via INTRAVENOUS

## 2023-07-18 MED ORDER — TECHNETIUM TC 99M TETROFOSMIN IV KIT
32.0000 | PACK | Freq: Once | INTRAVENOUS | Status: AC | PRN
Start: 2023-07-18 — End: 2023-07-18
  Administered 2023-07-18: 32 via INTRAVENOUS

## 2023-07-23 ENCOUNTER — Encounter (HOSPITAL_COMMUNITY): Payer: Self-pay

## 2023-07-23 NOTE — Progress Notes (Signed)
 Surgical Instructions    Your procedure is scheduled on Monday, 07/28/23.  Report to [facility] Main Entrance "A" at 5:30 A.M., then check in with the Admitting office.  Call this number if you have problems the morning of surgery:  [phone]   If you have any questions prior to your surgery date call [phone]: Open Monday-Friday 8am-4pm If you experience any cold or flu symptoms such as cough, fever, chills, shortness of breath, etc. between now and your scheduled surgery, please notify us at the above number     Remember:  Do not eat or drink after midnight the night before your surgery- Sunday.     Take these medicines the morning of surgery with A SIP OF WATER:  amLODipine (NORVASC)  atorvastatin (LIPITOR)  omeprazole (PRILOSEC)  tamsulosin (FLOMAX)    ORAL DIABETIC MEDICATION INSTRUCTIONS  The day before your procedure:  Take Metformin as you do normally  The day of your procedure:  Do not take your Metformin.   We will check your blood sugar levels during the admission process and again in Recovery before discharging you home  Hold Jardiance 72 hours prior to procedure.  Last dose will be on Thursday, 07/24/23. ____________________________________________ INSULIN (LONG ACTING) MEDICATION INSTRUCTIONS (Lantus)  The day of surgery, take 13 Units of Lantus Insulin ( your regular daily dose).    As of today, STOP taking any Aspirin (unless otherwise instructed by your surgeon) Aleve, Naproxen, Ibuprofen, Motrin, Advil, Goody's, BC's, all herbal medications, fish oil, and all vitamins.           Do not wear jewelry  Do not wear lotions, powders, cologne or deodorant. Men may shave face and neck. Do not bring valuables to the hospital.   [facility] is not responsible for any belongings or valuables.    Do NOT Smoke (Tobacco/Vaping)  24 hours prior to your procedure  If you use a CPAP at night, you may bring your mask for your overnight stay.   Contacts,  glasses, hearing aids, dentures or partials may not be worn into surgery, please bring cases for these belongings   For patients admitted to the hospital, discharge time will be determined by your treatment team.     SURGICAL WAITING ROOM VISITATION Patients having surgery or a procedure may have no more than 2 support people in the waiting area - these visitors may rotate.   Children under the age of 32 must have an adult with them who is not the patient. If the patient needs to stay at the hospital during part of their recovery, the visitor guidelines for inpatient rooms apply. Pre-op nurse will coordinate an appropriate time for 1 support person to accompany patient in pre-op.  This support person may not rotate.   Please refer to [email or web address] for the visitor guidelines for Inpatients (after your surgery is over and you are in a regular room).    Special instructions:    Oral Hygiene is also important to reduce your risk of infection.  Remember - BRUSH YOUR TEETH THE MORNING OF SURGERY WITH YOUR REGULAR TOOTHPASTE   [facility]- Preparing For Surgery  Before surgery, you can play an important role. Because skin is not sterile, your skin needs to be as free of germs as possible. You can reduce the number of germs on your skin by washing with CHG (chlorahexidine gluconate) Soap before surgery.  CHG is an antiseptic cleaner which kills germs and bonds with the skin to continue killing germs even  after washing.     Please do not use if you have an allergy to CHG or antibacterial soaps. If your skin becomes reddened/irritated stop using the CHG.  Do not shave (including legs and underarms) for at least 48 hours prior to first CHG shower. It is OK to shave your face.  Please follow these instructions carefully.     Shower the [employer] [facility] and the MORNING OF SURGERY-Mon with CHG Soap.   If you chose to wash your hair,  wash your hair first as usual with your normal shampoo. After you shampoo, rinse your hair and body thoroughly to remove the shampoo.  Then [employer] and genitals (private parts) with your normal soap and rinse thoroughly to remove soap.  After that Use CHG Soap as you would any other liquid soap. You can apply CHG directly to the skin and wash gently with a scrungie or a clean washcloth.   Apply the CHG Soap to your body ONLY FROM THE NECK DOWN.  Do not use on open wounds or open sores. Avoid contact with your eyes, ears, mouth and genitals (private parts). Wash Face and genitals (private parts)  with your normal soap.   Wash thoroughly, paying special attention to the area where your surgery will be performed.  Thoroughly rinse your body with warm water from the neck down.  DO NOT shower/wash with your normal soap after using and rinsing off the CHG Soap.  Pat yourself dry with a CLEAN TOWEL.  Wear CLEAN PAJAMAS to bed the night before surgery  Place CLEAN SHEETS on your bed the night before your surgery  DO NOT SLEEP WITH PETS.   Day of Surgery:  Take a shower with CHG soap. Wear Clean/Comfortable clothing the morning of surgery Do not apply any deodorants/lotions.   Remember to brush your teeth WITH YOUR REGULAR TOOTHPASTE.    If you received a COVID test during your pre-op visit, it is requested that you wear a mask when out in public, stay away from anyone that may not be feeling well, and notify your surgeon if you develop symptoms. If you have been in contact with anyone that has tested positive in the last 10 days, please notify your surgeon.    Please read over the following fact sheets that you were given.

## 2023-07-24 ENCOUNTER — Encounter (HOSPITAL_COMMUNITY)
Admission: RE | Admit: 2023-07-24 | Discharge: 2023-07-24 | Disposition: A | Discharge status: Home / Self Care | Payer: 59 | Source: Ambulatory Visit | Attending: Thoracic Surgery (Cardiothoracic Vascular Surgery) | Admitting: Thoracic Surgery (Cardiothoracic Vascular Surgery)

## 2023-07-24 ENCOUNTER — Ambulatory Visit (HOSPITAL_COMMUNITY)
Admission: RE | Admit: 2023-07-24 | Discharge: 2023-07-24 | Disposition: A | Discharge status: Home / Self Care | Source: Ambulatory Visit | Attending: Thoracic Surgery (Cardiothoracic Vascular Surgery) | Admitting: Thoracic Surgery (Cardiothoracic Vascular Surgery)

## 2023-07-24 ENCOUNTER — Other Ambulatory Visit: Payer: Self-pay

## 2023-07-24 ENCOUNTER — Encounter (HOSPITAL_COMMUNITY): Payer: Self-pay

## 2023-07-24 VITALS — BP 150/90 | HR 73 | Temp 97.6°F | Resp 17 | Ht 70.0 in | Wt 181.0 lb

## 2023-07-24 DIAGNOSIS — Z01818 Encounter for other preprocedural examination: Secondary | ICD-10-CM | POA: Diagnosis present

## 2023-07-24 DIAGNOSIS — E119 Type 2 diabetes mellitus without complications: Secondary | ICD-10-CM | POA: Diagnosis not present

## 2023-07-24 DIAGNOSIS — Z794 Long term (current) use of insulin: Secondary | ICD-10-CM | POA: Diagnosis not present

## 2023-07-24 DIAGNOSIS — C155 Malignant neoplasm of lower third of esophagus: Secondary | ICD-10-CM | POA: Diagnosis not present

## 2023-07-24 HISTORY — DX: Malignant (primary) neoplasm, unspecified: C80.1

## 2023-07-24 HISTORY — DX: Unspecified osteoarthritis, unspecified site: M19.90

## 2023-07-24 HISTORY — DX: Chronic kidney disease, unspecified: N18.9

## 2023-07-24 LAB — APTT: aPTT: 28 s (ref 24–36)

## 2023-07-24 LAB — TYPE AND SCREEN
ABO/RH(D): B NEG
Antibody Screen: NEGATIVE

## 2023-07-24 LAB — URINALYSIS, ROUTINE W REFLEX MICROSCOPIC
Bacteria, UA: NONE SEEN
Bilirubin Urine: NEGATIVE
Glucose, UA: >=500 mg/dL — AB
Hgb urine dipstick: NEGATIVE
Ketones, ur: NEGATIVE mg/dL
Leukocytes,Ua: NEGATIVE
Nitrite: NEGATIVE
Protein, ur: NEGATIVE mg/dL
Specific Gravity, Urine: 1.021 (ref 1.005–1.030)
pH: 5 (ref 5.0–8.0)

## 2023-07-24 LAB — CBC
HCT: 39 % (ref 39.0–52.0)
Hemoglobin: 12.9 g/dL — ABNORMAL LOW (ref 13.0–17.0)
MCH: 32.2 pg (ref 26.0–34.0)
MCHC: 33.1 g/dL (ref 30.0–36.0)
MCV: 97.3 fL (ref 80.0–100.0)
Platelets: 215 10*3/uL (ref 150–400)
RBC: 4.01 MIL/uL — ABNORMAL LOW (ref 4.22–5.81)
RDW: 16.1 % — ABNORMAL HIGH (ref 11.5–15.5)
WBC: 5.2 10*3/uL (ref 4.0–10.5)
nRBC: 0 % (ref 0.0–0.2)

## 2023-07-24 LAB — COMPREHENSIVE METABOLIC PANEL
ALT: 20 U/L (ref 0–44)
AST: 23 U/L (ref 15–41)
Albumin: 4 g/dL (ref 3.5–5.0)
Alkaline Phosphatase: 109 U/L (ref 38–126)
Anion gap: 5 (ref 5–15)
BUN: 21 mg/dL (ref 8–23)
CO2: 28 mmol/L (ref 22–32)
Calcium: 9.2 mg/dL (ref 8.9–10.3)
Chloride: 106 mmol/L (ref 98–111)
Creatinine, Ser: 1.23 mg/dL (ref 0.61–1.24)
GFR, Estimated: >60 mL/min (ref >60)
Glucose, Bld: 119 mg/dL — ABNORMAL HIGH (ref 70–99)
Potassium: 4.3 mmol/L (ref 3.5–5.1)
Sodium: 139 mmol/L (ref 135–145)
Total Bilirubin: 0.6 mg/dL (ref 0.0–1.2)
Total Protein: 7.3 g/dL (ref 6.5–8.1)

## 2023-07-24 LAB — HEMOGLOBIN A1C
Hgb A1c MFr Bld: 7.1 % — ABNORMAL HIGH (ref 4.8–5.6)
Mean Plasma Glucose: 157.07 mg/dL

## 2023-07-24 LAB — PROTIME-INR
INR: 0.9 (ref 0.8–1.2)
Prothrombin Time: 12.5 s (ref 11.4–15.2)

## 2023-07-24 LAB — GLUCOSE, CAPILLARY: Glucose-Capillary: 142 mg/dL — ABNORMAL HIGH (ref 70–99)

## 2023-07-24 LAB — SURGICAL PCR SCREEN
MRSA, PCR: NEGATIVE
Staphylococcus aureus: NEGATIVE

## 2023-07-24 NOTE — Progress Notes (Signed)
 PCP - Shelle Iron, MD Cardiologist - denies  PPM/ICD - denies Device Orders - n/a Rep Notified - n/a  Chest x-ray - 07/24/2023 EKG - 07/24/2023 Stress Test - 07/18/2023 ECHO - denies Cardiac Cath - denies  Sleep Study - >20 years ago. Per pt it was neg CPAP - denies  Fasting Blood Sugar - 180-200. CBG 142 at PAT appt Checks Blood Sugar 3-4 times a day  Last dose of GLP1 agonist-  n/a GLP1 instructions: n/a  Blood Thinner Instructions: Aspirin Instructions: follow surgeon's instructions   ERAS Protcol - no, NPO PRE-SURGERY Ensure or G2- n/a  COVID TEST- n/a   Anesthesia review: yes, recent stress test.    Patient denies shortness of breath, fever, cough and chest pain at PAT appointment   All instructions explained to the patient, with a verbal understanding of the material. Patient agrees to go over the instructions while at home for a better understanding. Patient also instructed to self quarantine after being tested for COVID-19. The opportunity to ask questions was provided.

## 2023-07-28 ENCOUNTER — Encounter (HOSPITAL_COMMUNITY): Payer: Self-pay | Admitting: Thoracic Surgery (Cardiothoracic Vascular Surgery)

## 2023-07-28 ENCOUNTER — Inpatient Hospital Stay (HOSPITAL_COMMUNITY)
Admission: RE | Admit: 2023-07-28 | Discharge: 2023-08-19 | DRG: 326 | Disposition: A | Discharge status: Home Health | Payer: 59 | Attending: Thoracic Surgery (Cardiothoracic Vascular Surgery) | Admitting: Thoracic Surgery (Cardiothoracic Vascular Surgery)

## 2023-07-28 ENCOUNTER — Encounter (HOSPITAL_COMMUNITY)
Admission: RE | Disposition: A | Discharge status: Home Health | Payer: Self-pay | Source: Home / Self Care | Attending: Thoracic Surgery (Cardiothoracic Vascular Surgery)

## 2023-07-28 ENCOUNTER — Other Ambulatory Visit: Payer: Self-pay

## 2023-07-28 ENCOUNTER — Inpatient Hospital Stay (HOSPITAL_COMMUNITY)

## 2023-07-28 ENCOUNTER — Inpatient Hospital Stay (HOSPITAL_COMMUNITY): Payer: Self-pay | Admitting: Certified Registered Nurse Anesthetist

## 2023-07-28 ENCOUNTER — Inpatient Hospital Stay (HOSPITAL_COMMUNITY): Payer: Self-pay | Admitting: Physician Assistant

## 2023-07-28 DIAGNOSIS — E43 Unspecified severe protein-calorie malnutrition: Secondary | ICD-10-CM | POA: Diagnosis present

## 2023-07-28 DIAGNOSIS — N1831 Chronic kidney disease, stage 3a: Secondary | ICD-10-CM | POA: Diagnosis present

## 2023-07-28 DIAGNOSIS — K9413 Enterostomy malfunction: Secondary | ICD-10-CM | POA: Diagnosis not present

## 2023-07-28 DIAGNOSIS — I959 Hypotension, unspecified: Secondary | ICD-10-CM | POA: Diagnosis not present

## 2023-07-28 DIAGNOSIS — I973 Postprocedural hypertension: Secondary | ICD-10-CM | POA: Diagnosis not present

## 2023-07-28 DIAGNOSIS — I898 Other specified noninfective disorders of lymphatic vessels and lymph nodes: Secondary | ICD-10-CM | POA: Diagnosis not present

## 2023-07-28 DIAGNOSIS — I129 Hypertensive chronic kidney disease with stage 1 through stage 4 chronic kidney disease, or unspecified chronic kidney disease: Secondary | ICD-10-CM

## 2023-07-28 DIAGNOSIS — T50995A Adverse effect of other drugs, medicaments and biological substances, initial encounter: Secondary | ICD-10-CM | POA: Diagnosis not present

## 2023-07-28 DIAGNOSIS — K9189 Other postprocedural complications and disorders of digestive system: Secondary | ICD-10-CM | POA: Diagnosis present

## 2023-07-28 DIAGNOSIS — R1311 Dysphagia, oral phase: Secondary | ICD-10-CM | POA: Diagnosis present

## 2023-07-28 DIAGNOSIS — J918 Pleural effusion in other conditions classified elsewhere: Secondary | ICD-10-CM | POA: Diagnosis not present

## 2023-07-28 DIAGNOSIS — J9811 Atelectasis: Secondary | ICD-10-CM | POA: Diagnosis not present

## 2023-07-28 DIAGNOSIS — E876 Hypokalemia: Secondary | ICD-10-CM | POA: Diagnosis present

## 2023-07-28 DIAGNOSIS — R1310 Dysphagia, unspecified: Secondary | ICD-10-CM | POA: Diagnosis not present

## 2023-07-28 DIAGNOSIS — D62 Acute posthemorrhagic anemia: Secondary | ICD-10-CM | POA: Diagnosis not present

## 2023-07-28 DIAGNOSIS — R6510 Systemic inflammatory response syndrome (SIRS) of non-infectious origin without acute organ dysfunction: Secondary | ICD-10-CM | POA: Diagnosis not present

## 2023-07-28 DIAGNOSIS — F410 Panic disorder [episodic paroxysmal anxiety] without agoraphobia: Secondary | ICD-10-CM | POA: Diagnosis not present

## 2023-07-28 DIAGNOSIS — J9601 Acute respiratory failure with hypoxia: Secondary | ICD-10-CM | POA: Diagnosis not present

## 2023-07-28 DIAGNOSIS — E785 Hyperlipidemia, unspecified: Secondary | ICD-10-CM | POA: Diagnosis present

## 2023-07-28 DIAGNOSIS — N401 Enlarged prostate with lower urinary tract symptoms: Secondary | ICD-10-CM | POA: Diagnosis not present

## 2023-07-28 DIAGNOSIS — C155 Malignant neoplasm of lower third of esophagus: Principal | ICD-10-CM | POA: Diagnosis present

## 2023-07-28 DIAGNOSIS — Z923 Personal history of irradiation: Secondary | ICD-10-CM

## 2023-07-28 DIAGNOSIS — R509 Fever, unspecified: Secondary | ICD-10-CM | POA: Diagnosis not present

## 2023-07-28 DIAGNOSIS — D630 Anemia in neoplastic disease: Secondary | ICD-10-CM | POA: Diagnosis present

## 2023-07-28 DIAGNOSIS — B370 Candidal stomatitis: Secondary | ICD-10-CM | POA: Diagnosis not present

## 2023-07-28 DIAGNOSIS — E1165 Type 2 diabetes mellitus with hyperglycemia: Secondary | ICD-10-CM | POA: Diagnosis not present

## 2023-07-28 DIAGNOSIS — J948 Other specified pleural conditions: Secondary | ICD-10-CM | POA: Diagnosis not present

## 2023-07-28 DIAGNOSIS — Z9049 Acquired absence of other specified parts of digestive tract: Secondary | ICD-10-CM

## 2023-07-28 DIAGNOSIS — Z7984 Long term (current) use of oral hypoglycemic drugs: Secondary | ICD-10-CM

## 2023-07-28 DIAGNOSIS — L24B1 Irritant contact dermatitis related to digestive stoma or fistula: Secondary | ICD-10-CM | POA: Diagnosis not present

## 2023-07-28 DIAGNOSIS — N179 Acute kidney failure, unspecified: Secondary | ICD-10-CM | POA: Diagnosis not present

## 2023-07-28 DIAGNOSIS — R197 Diarrhea, unspecified: Secondary | ICD-10-CM | POA: Diagnosis not present

## 2023-07-28 DIAGNOSIS — J90 Pleural effusion, not elsewhere classified: Secondary | ICD-10-CM | POA: Diagnosis not present

## 2023-07-28 DIAGNOSIS — J690 Pneumonitis due to inhalation of food and vomit: Secondary | ICD-10-CM | POA: Diagnosis not present

## 2023-07-28 DIAGNOSIS — Y831 Surgical operation with implant of artificial internal device as the cause of abnormal reaction of the patient, or of later complication, without mention of misadventure at the time of the procedure: Secondary | ICD-10-CM | POA: Diagnosis not present

## 2023-07-28 DIAGNOSIS — Z794 Long term (current) use of insulin: Secondary | ICD-10-CM

## 2023-07-28 DIAGNOSIS — Y832 Surgical operation with anastomosis, bypass or graft as the cause of abnormal reaction of the patient, or of later complication, without mention of misadventure at the time of the procedure: Secondary | ICD-10-CM | POA: Diagnosis present

## 2023-07-28 DIAGNOSIS — E872 Acidosis, unspecified: Secondary | ICD-10-CM | POA: Diagnosis present

## 2023-07-28 DIAGNOSIS — N183 Chronic kidney disease, stage 3 unspecified: Secondary | ICD-10-CM

## 2023-07-28 DIAGNOSIS — E119 Type 2 diabetes mellitus without complications: Secondary | ICD-10-CM

## 2023-07-28 DIAGNOSIS — N400 Enlarged prostate without lower urinary tract symptoms: Secondary | ICD-10-CM | POA: Diagnosis not present

## 2023-07-28 DIAGNOSIS — J9801 Acute bronchospasm: Secondary | ICD-10-CM | POA: Diagnosis not present

## 2023-07-28 DIAGNOSIS — C159 Malignant neoplasm of esophagus, unspecified: Secondary | ICD-10-CM | POA: Diagnosis not present

## 2023-07-28 DIAGNOSIS — E440 Moderate protein-calorie malnutrition: Secondary | ICD-10-CM | POA: Diagnosis present

## 2023-07-28 DIAGNOSIS — Z7982 Long term (current) use of aspirin: Secondary | ICD-10-CM

## 2023-07-28 DIAGNOSIS — Z79899 Other long term (current) drug therapy: Secondary | ICD-10-CM

## 2023-07-28 DIAGNOSIS — I10 Essential (primary) hypertension: Secondary | ICD-10-CM | POA: Diagnosis not present

## 2023-07-28 DIAGNOSIS — N182 Chronic kidney disease, stage 2 (mild): Secondary | ICD-10-CM | POA: Diagnosis not present

## 2023-07-28 DIAGNOSIS — E870 Hyperosmolality and hypernatremia: Secondary | ICD-10-CM | POA: Diagnosis not present

## 2023-07-28 DIAGNOSIS — R7881 Bacteremia: Secondary | ICD-10-CM | POA: Diagnosis present

## 2023-07-28 DIAGNOSIS — T85598A Other mechanical complication of other gastrointestinal prosthetic devices, implants and grafts, initial encounter: Secondary | ICD-10-CM | POA: Diagnosis not present

## 2023-07-28 DIAGNOSIS — E860 Dehydration: Secondary | ICD-10-CM | POA: Diagnosis present

## 2023-07-28 DIAGNOSIS — Z1152 Encounter for screening for COVID-19: Secondary | ICD-10-CM

## 2023-07-28 DIAGNOSIS — T380X5A Adverse effect of glucocorticoids and synthetic analogues, initial encounter: Secondary | ICD-10-CM | POA: Diagnosis not present

## 2023-07-28 DIAGNOSIS — E11649 Type 2 diabetes mellitus with hypoglycemia without coma: Secondary | ICD-10-CM | POA: Diagnosis not present

## 2023-07-28 DIAGNOSIS — K9429 Other complications of gastrostomy: Secondary | ICD-10-CM | POA: Diagnosis not present

## 2023-07-28 DIAGNOSIS — E1122 Type 2 diabetes mellitus with diabetic chronic kidney disease: Secondary | ICD-10-CM

## 2023-07-28 DIAGNOSIS — J940 Chylous effusion: Secondary | ICD-10-CM | POA: Diagnosis not present

## 2023-07-28 DIAGNOSIS — Z781 Physical restraint status: Secondary | ICD-10-CM

## 2023-07-28 DIAGNOSIS — E46 Unspecified protein-calorie malnutrition: Secondary | ICD-10-CM | POA: Diagnosis not present

## 2023-07-28 DIAGNOSIS — I952 Hypotension due to drugs: Secondary | ICD-10-CM | POA: Diagnosis not present

## 2023-07-28 DIAGNOSIS — J869 Pyothorax without fistula: Secondary | ICD-10-CM | POA: Diagnosis present

## 2023-07-28 DIAGNOSIS — Z9889 Other specified postprocedural states: Secondary | ICD-10-CM

## 2023-07-28 DIAGNOSIS — G9341 Metabolic encephalopathy: Secondary | ICD-10-CM | POA: Diagnosis not present

## 2023-07-28 DIAGNOSIS — Z9221 Personal history of antineoplastic chemotherapy: Secondary | ICD-10-CM

## 2023-07-28 DIAGNOSIS — J392 Other diseases of pharynx: Secondary | ICD-10-CM | POA: Diagnosis not present

## 2023-07-28 DIAGNOSIS — J384 Edema of larynx: Secondary | ICD-10-CM | POA: Diagnosis not present

## 2023-07-28 DIAGNOSIS — Z6825 Body mass index (BMI) 25.0-25.9, adult: Secondary | ICD-10-CM

## 2023-07-28 DIAGNOSIS — Z6828 Body mass index (BMI) 28.0-28.9, adult: Secondary | ICD-10-CM

## 2023-07-28 DIAGNOSIS — R338 Other retention of urine: Secondary | ICD-10-CM | POA: Diagnosis not present

## 2023-07-28 HISTORY — PX: ESOPHAGOGASTRODUODENOSCOPY: SHX5428

## 2023-07-28 LAB — GLUCOSE, CAPILLARY
Glucose-Capillary: 108 mg/dL — ABNORMAL HIGH (ref 70–99)
Glucose-Capillary: 124 mg/dL — ABNORMAL HIGH (ref 70–99)
Glucose-Capillary: 126 mg/dL — ABNORMAL HIGH (ref 70–99)
Glucose-Capillary: 154 mg/dL — ABNORMAL HIGH (ref 70–99)
Glucose-Capillary: 162 mg/dL — ABNORMAL HIGH (ref 70–99)
Glucose-Capillary: 162 mg/dL — ABNORMAL HIGH (ref 70–99)
Glucose-Capillary: 171 mg/dL — ABNORMAL HIGH (ref 70–99)
Glucose-Capillary: 173 mg/dL — ABNORMAL HIGH (ref 70–99)
Glucose-Capillary: 174 mg/dL — ABNORMAL HIGH (ref 70–99)
Glucose-Capillary: 180 mg/dL — ABNORMAL HIGH (ref 70–99)
Glucose-Capillary: 211 mg/dL — ABNORMAL HIGH (ref 70–99)
Glucose-Capillary: 99 mg/dL (ref 70–99)

## 2023-07-28 LAB — ABO/RH: ABO/RH(D): B NEG

## 2023-07-28 SURGERY — ESOPHAGECTOMY, ROBOT-ASSISTED
Anesthesia: General | Site: Chest

## 2023-07-28 MED ORDER — PROPOFOL 10 MG/ML IV BOLUS
INTRAVENOUS | Status: AC
  Filled 2023-07-28: qty 20

## 2023-07-28 MED ORDER — LIDOCAINE 2% (20 MG/ML) 5 ML SYRINGE
INTRAMUSCULAR | Status: DC | PRN
  Administered 2023-07-28: 100 mg via INTRAVENOUS

## 2023-07-28 MED ORDER — LACTATED RINGERS IV SOLN: INTRAVENOUS | Status: DC

## 2023-07-28 MED ORDER — ONDANSETRON HCL 4 MG/2ML IJ SOLN
INTRAMUSCULAR | Status: DC | PRN
  Administered 2023-07-28: 4 mg via INTRAVENOUS

## 2023-07-28 MED ORDER — FENTANYL CITRATE (PF) 100 MCG/2ML IJ SOLN: 25.0000 ug | INTRAMUSCULAR | Status: DC | PRN

## 2023-07-28 MED ORDER — ALBUMIN HUMAN 5 % IV SOLN: INTRAVENOUS | Status: DC | PRN

## 2023-07-28 MED ORDER — HYDRALAZINE HCL 20 MG/ML IJ SOLN
10.0000 mg | Freq: Four times a day (QID) | INTRAMUSCULAR | Status: DC | PRN
  Administered 2023-07-28: 10 mg via INTRAVENOUS
  Filled 2023-07-28: qty 1

## 2023-07-28 MED ORDER — KETAMINE HCL 50 MG/5ML IJ SOSY
PREFILLED_SYRINGE | INTRAMUSCULAR | Status: AC
  Filled 2023-07-28: qty 5

## 2023-07-28 MED ORDER — FENTANYL CITRATE (PF) 250 MCG/5ML IJ SOLN
INTRAMUSCULAR | Status: DC | PRN
  Administered 2023-07-28 (×2): 50 ug via INTRAVENOUS
  Administered 2023-07-28: 100 ug via INTRAVENOUS
  Administered 2023-07-28 (×3): 50 ug via INTRAVENOUS
  Administered 2023-07-28 (×2): 100 ug via INTRAVENOUS
  Administered 2023-07-28 (×5): 50 ug via INTRAVENOUS
  Administered 2023-07-28: 100 ug via INTRAVENOUS
  Administered 2023-07-28: 50 ug via INTRAVENOUS

## 2023-07-28 MED ORDER — HYDROMORPHONE HCL 1 MG/ML IJ SOLN
INTRAMUSCULAR | Status: AC
  Filled 2023-07-28: qty 0.5

## 2023-07-28 MED ORDER — SUGAMMADEX SODIUM 200 MG/2ML IV SOLN
INTRAVENOUS | Status: DC | PRN
  Administered 2023-07-28: 200 mg via INTRAVENOUS

## 2023-07-28 MED ORDER — SODIUM CHLORIDE 0.9 % IV SOLN
2.0000 g | INTRAVENOUS | Status: AC
  Administered 2023-07-28 (×5): 2 g via INTRAVENOUS
  Filled 2023-07-28 (×5): qty 2

## 2023-07-28 MED ORDER — BUPIVACAINE HCL 0.25 % IJ SOLN
INTRAMUSCULAR | Status: DC | PRN
  Administered 2023-07-28: 30 mL

## 2023-07-28 MED ORDER — FENTANYL CITRATE (PF) 250 MCG/5ML IJ SOLN
INTRAMUSCULAR | Status: AC
  Filled 2023-07-28: qty 5

## 2023-07-28 MED ORDER — MORPHINE SULFATE (PF) 2 MG/ML IV SOLN
1.0000 mg | INTRAVENOUS | Status: DC | PRN
  Administered 2023-07-28: 4 mg via INTRAVENOUS
  Administered 2023-07-28 (×2): 2 mg via INTRAVENOUS
  Administered 2023-07-29 (×2): 4 mg via INTRAVENOUS
  Administered 2023-07-29: 2 mg via INTRAVENOUS
  Administered 2023-07-29 – 2023-07-30 (×7): 4 mg via INTRAVENOUS
  Filled 2023-07-28 (×3): qty 2
  Filled 2023-07-28: qty 1
  Filled 2023-07-28: qty 2
  Filled 2023-07-28: qty 1
  Filled 2023-07-28 (×3): qty 2
  Filled 2023-07-28: qty 1
  Filled 2023-07-28 (×3): qty 2

## 2023-07-28 MED ORDER — DEXAMETHASONE SODIUM PHOSPHATE 10 MG/ML IJ SOLN
INTRAMUSCULAR | Status: DC | PRN
  Administered 2023-07-28: 10 mg via INTRAVENOUS

## 2023-07-28 MED ORDER — CHLORHEXIDINE GLUCONATE 0.12 % MT SOLN
15.0000 mL | Freq: Once | OROMUCOSAL | Status: AC
  Administered 2023-07-28: 15 mL via OROMUCOSAL
  Filled 2023-07-28: qty 15

## 2023-07-28 MED ORDER — PANTOPRAZOLE SODIUM 40 MG IV SOLR
40.0000 mg | Freq: Two times a day (BID) | INTRAVENOUS | Status: DC
  Administered 2023-07-28 – 2023-08-06 (×19): 40 mg via INTRAVENOUS
  Filled 2023-07-28 (×19): qty 10

## 2023-07-28 MED ORDER — ONDANSETRON HCL 4 MG/2ML IJ SOLN: 4.0000 mg | INTRAMUSCULAR | Status: DC | PRN

## 2023-07-28 MED ORDER — PHENYLEPHRINE HCL-NACL 20-0.9 MG/250ML-% IV SOLN
INTRAVENOUS | Status: DC | PRN
  Administered 2023-07-28: 25 ug/min via INTRAVENOUS

## 2023-07-28 MED ORDER — DEXAMETHASONE SODIUM PHOSPHATE 10 MG/ML IJ SOLN
INTRAMUSCULAR | Status: AC
  Filled 2023-07-28: qty 1

## 2023-07-28 MED ORDER — SODIUM CHLORIDE FLUSH 0.9 % IV SOLN
INTRAVENOUS | Status: DC | PRN
  Administered 2023-07-28: 100 mL

## 2023-07-28 MED ORDER — ALBUTEROL SULFATE (2.5 MG/3ML) 0.083% IN NEBU: 2.5000 mg | INHALATION_SOLUTION | RESPIRATORY_TRACT | Status: AC | PRN

## 2023-07-28 MED ORDER — SUGAMMADEX SODIUM 200 MG/2ML IV SOLN
INTRAVENOUS | Status: AC
  Filled 2023-07-28: qty 2

## 2023-07-28 MED ORDER — OXYCODONE HCL 5 MG PO TABS
5.0000 mg | ORAL_TABLET | ORAL | Status: DC | PRN
  Administered 2023-07-29: 10 mg
  Filled 2023-07-28: qty 2

## 2023-07-28 MED ORDER — LACTATED RINGERS IV SOLN: INTRAVENOUS | Status: DC | PRN

## 2023-07-28 MED ORDER — MIDAZOLAM HCL 2 MG/2ML IJ SOLN
INTRAMUSCULAR | Status: DC | PRN
  Administered 2023-07-28: 2 mg via INTRAVENOUS

## 2023-07-28 MED ORDER — OSMOLITE 1.2 CAL PO LIQD
1000.0000 mL | ORAL | Status: DC
  Administered 2023-07-28: 1000 mL
  Filled 2023-07-28: qty 1000

## 2023-07-28 MED ORDER — HYDROMORPHONE HCL 1 MG/ML IJ SOLN
INTRAMUSCULAR | Status: DC | PRN
  Administered 2023-07-28 (×3): .5 mg via INTRAVENOUS

## 2023-07-28 MED ORDER — OXYCODONE HCL 5 MG PO TABS: 5.0000 mg | ORAL_TABLET | Freq: Once | ORAL | Status: DC | PRN

## 2023-07-28 MED ORDER — INSULIN REGULAR(HUMAN) IN NACL 100-0.9 UT/100ML-% IV SOLN
INTRAVENOUS | Status: DC
  Administered 2023-07-28: 7 [IU]/h via INTRAVENOUS

## 2023-07-28 MED ORDER — SODIUM CHLORIDE 0.45 % IV SOLN: INTRAVENOUS | Status: DC

## 2023-07-28 MED ORDER — ROCURONIUM BROMIDE 10 MG/ML (PF) SYRINGE
PREFILLED_SYRINGE | INTRAVENOUS | Status: DC | PRN
  Administered 2023-07-28: 50 mg via INTRAVENOUS
  Administered 2023-07-28: 100 mg via INTRAVENOUS
  Administered 2023-07-28: 50 mg via INTRAVENOUS
  Administered 2023-07-28: 20 mg via INTRAVENOUS
  Administered 2023-07-28: 50 mg via INTRAVENOUS
  Administered 2023-07-28: 20 mg via INTRAVENOUS

## 2023-07-28 MED ORDER — MIDAZOLAM HCL 2 MG/2ML IJ SOLN
INTRAMUSCULAR | Status: AC
  Filled 2023-07-28: qty 2

## 2023-07-28 MED ORDER — EPHEDRINE 5 MG/ML INJ
INTRAVENOUS | Status: AC
Start: 2023-07-28 — End: ?
  Filled 2023-07-28: qty 5

## 2023-07-28 MED ORDER — ACETAMINOPHEN 10 MG/ML IV SOLN: 1000.0000 mg | Freq: Once | INTRAVENOUS | Status: DC | PRN

## 2023-07-28 MED ORDER — BUPIVACAINE HCL (PF) 0.5 % IJ SOLN
INTRAMUSCULAR | Status: AC
  Filled 2023-07-28: qty 30

## 2023-07-28 MED ORDER — ROCURONIUM BROMIDE 10 MG/ML (PF) SYRINGE
PREFILLED_SYRINGE | INTRAVENOUS | Status: AC
  Filled 2023-07-28: qty 10

## 2023-07-28 MED ORDER — ONDANSETRON HCL 4 MG/2ML IJ SOLN
INTRAMUSCULAR | Status: AC
  Filled 2023-07-28: qty 2

## 2023-07-28 MED ORDER — BUPIVACAINE HCL (PF) 0.25 % IJ SOLN
INTRAMUSCULAR | Status: AC
  Filled 2023-07-28: qty 30

## 2023-07-28 MED ORDER — INSULIN ASPART 100 UNIT/ML IJ SOLN
0.0000 [IU] | INTRAMUSCULAR | Status: DC
  Administered 2023-07-28 – 2023-07-29 (×4): 4 [IU] via SUBCUTANEOUS
  Administered 2023-07-29: 8 [IU] via SUBCUTANEOUS
  Administered 2023-07-29 (×2): 4 [IU] via SUBCUTANEOUS
  Administered 2023-07-30 (×3): 8 [IU] via SUBCUTANEOUS
  Administered 2023-07-30 (×3): 4 [IU] via SUBCUTANEOUS
  Administered 2023-07-31: 8 [IU] via SUBCUTANEOUS
  Administered 2023-07-31 (×2): 4 [IU] via SUBCUTANEOUS

## 2023-07-28 MED ORDER — KETAMINE HCL 50 MG/5ML IJ SOSY
PREFILLED_SYRINGE | INTRAMUSCULAR | Status: DC | PRN
  Administered 2023-07-28: 10 mg via INTRAVENOUS
  Administered 2023-07-28: 40 mg via INTRAVENOUS

## 2023-07-28 MED ORDER — 0.9 % SODIUM CHLORIDE (POUR BTL) OPTIME
TOPICAL | Status: DC | PRN
  Administered 2023-07-28: 2000 mL

## 2023-07-28 MED ORDER — ONDANSETRON HCL 4 MG/2ML IJ SOLN: 4.0000 mg | Freq: Once | INTRAMUSCULAR | Status: DC | PRN

## 2023-07-28 MED ORDER — DIAZEPAM 5 MG/ML PO CONC: 5.0000 mg | Freq: Once | ORAL | Status: DC

## 2023-07-28 MED ORDER — CHLORHEXIDINE GLUCONATE 0.12 % MT SOLN
15.0000 mL | OROMUCOSAL | Status: DC
  Filled 2023-07-28: qty 15

## 2023-07-28 MED ORDER — METOCLOPRAMIDE HCL 5 MG/ML IJ SOLN
10.0000 mg | Freq: Four times a day (QID) | INTRAMUSCULAR | Status: DC
  Administered 2023-07-28 – 2023-08-07 (×38): 10 mg via INTRAVENOUS
  Filled 2023-07-28 (×38): qty 2

## 2023-07-28 MED ORDER — ACETAMINOPHEN 10 MG/ML IV SOLN
INTRAVENOUS | Status: AC
  Filled 2023-07-28: qty 100

## 2023-07-28 MED ORDER — EPHEDRINE SULFATE-NACL 50-0.9 MG/10ML-% IV SOSY
PREFILLED_SYRINGE | INTRAVENOUS | Status: DC | PRN
  Administered 2023-07-28: 2.5 mg via INTRAVENOUS
  Administered 2023-07-28: 5 mg via INTRAVENOUS

## 2023-07-28 MED ORDER — SODIUM CHLORIDE 0.9 % IV SOLN
2.0000 g | Freq: Four times a day (QID) | INTRAVENOUS | Status: AC
  Administered 2023-07-28 – 2023-07-29 (×3): 2 g via INTRAVENOUS
  Filled 2023-07-28 (×5): qty 2

## 2023-07-28 MED ORDER — PROPOFOL 10 MG/ML IV BOLUS
INTRAVENOUS | Status: DC | PRN
  Administered 2023-07-28: 100 mg via INTRAVENOUS
  Administered 2023-07-28: 20 mg via INTRAVENOUS

## 2023-07-28 MED ORDER — OXYCODONE HCL 5 MG/5ML PO SOLN: 5.0000 mg | ORAL | Status: DC | PRN

## 2023-07-28 MED ORDER — DIAZEPAM 5 MG PO TABS
5.0000 mg | ORAL_TABLET | Freq: Once | ORAL | Status: AC
  Administered 2023-07-28: 5 mg
  Filled 2023-07-28: qty 1

## 2023-07-28 MED ORDER — ORAL CARE MOUTH RINSE: 15.0000 mL | Freq: Once | OROMUCOSAL | Status: AC

## 2023-07-28 MED ORDER — PHENYLEPHRINE 80 MCG/ML (10ML) SYRINGE FOR IV PUSH (FOR BLOOD PRESSURE SUPPORT)
PREFILLED_SYRINGE | INTRAVENOUS | Status: AC
  Filled 2023-07-28: qty 10

## 2023-07-28 MED ORDER — LIDOCAINE 2% (20 MG/ML) 5 ML SYRINGE
INTRAMUSCULAR | Status: AC
  Filled 2023-07-28: qty 5

## 2023-07-28 MED ORDER — OXYCODONE HCL 5 MG/5ML PO SOLN: 5.0000 mg | Freq: Once | ORAL | Status: DC | PRN

## 2023-07-28 MED ORDER — BUPIVACAINE LIPOSOME 1.3 % IJ SUSP
INTRAMUSCULAR | Status: AC
  Filled 2023-07-28: qty 20

## 2023-07-28 MED ORDER — ENOXAPARIN SODIUM 40 MG/0.4ML IJ SOSY
40.0000 mg | PREFILLED_SYRINGE | INTRAMUSCULAR | Status: DC
  Administered 2023-07-29 – 2023-08-18 (×21): 40 mg via SUBCUTANEOUS
  Filled 2023-07-28 (×21): qty 0.4

## 2023-07-28 MED ORDER — ACETAMINOPHEN 10 MG/ML IV SOLN
INTRAVENOUS | Status: DC | PRN
Start: 2023-07-28 — End: 2023-07-28
  Administered 2023-07-28: 1000 mg via INTRAVENOUS

## 2023-07-28 SURGICAL SUPPLY — 119 items
BLADE SURG 11 STRL SS (BLADE) ×2 IMPLANT
BUTTON OLYMPUS DEFENDO 5 PIECE (MISCELLANEOUS) ×2 IMPLANT
CANISTER SUCT 3000ML PPV (MISCELLANEOUS) ×4 IMPLANT
CANNULA REDUCER 12-8 DVNC XI (CANNULA) ×2 IMPLANT
CATH THOR STR 28F SOFT WA (CATHETERS) IMPLANT
CATH THORACIC 28FR (CATHETERS) IMPLANT
CAUTERY SPATULA MNPLR 1.7 DVNC (INSTRUMENTS) IMPLANT
CHLORAPREP W/TINT 26 (MISCELLANEOUS) ×4 IMPLANT
CNTNR URN SCR LID CUP LEK RST (MISCELLANEOUS) ×2 IMPLANT
CONN ST 1/4X3/8 BEN (MISCELLANEOUS) ×2 IMPLANT
COVER TIP SHEARS 8 DVNC (MISCELLANEOUS) IMPLANT
DEFOGGER SCOPE WARMER CLEARIFY (MISCELLANEOUS) ×2 IMPLANT
DERMABOND ADVANCED .7 DNX12 (GAUZE/BANDAGES/DRESSINGS) ×4 IMPLANT
DEVICE SUTURE ENDOST 10MM (ENDOMECHANICALS) IMPLANT
DRAIN CHANNEL 19F RND (DRAIN) IMPLANT
DRAIN CHANNEL 28F RND 3/8 FF (WOUND CARE) IMPLANT
DRAIN PENROSE .5X12 LATEX STL (DRAIN) IMPLANT
DRAIN PENROSE 0.5X18 (DRAIN) IMPLANT
DRAPE ARM DVNC X/XI (DISPOSABLE) ×8 IMPLANT
DRAPE COLUMN DVNC XI (DISPOSABLE) ×2 IMPLANT
DRAPE CV SPLIT W-CLR ANES SCRN (DRAPES) ×4 IMPLANT
DRAPE INCISE IOBAN 66X45 STRL (DRAPES) ×4 IMPLANT
DRAPE SURG ORHT 6 SPLT 77X108 (DRAPES) ×4 IMPLANT
DRIVER NDL LRG 8 DVNC XI (INSTRUMENTS) IMPLANT
DRIVER NDL MEGA 8 DVNC XI (INSTRUMENTS) IMPLANT
DRIVER NDLE LRG 8 DVNC XI (INSTRUMENTS) ×2 IMPLANT
DRIVER NDLE MEGA DVNC XI (INSTRUMENTS) ×2 IMPLANT
DRSG TEGADERM 2-3/8X2-3/4 SM (GAUZE/BANDAGES/DRESSINGS) IMPLANT
DRSG TEGADERM 4X4.75 (GAUZE/BANDAGES/DRESSINGS) IMPLANT
ELECT REM PT RETURN 9FT ADLT (ELECTROSURGICAL) ×2 IMPLANT
ELECTRODE REM PT RTRN 9FT ADLT (ELECTROSURGICAL) ×2 IMPLANT
EVACUATOR SILICONE 100CC (DRAIN) IMPLANT
FELT TEFLON 1X6 (MISCELLANEOUS) IMPLANT
FORCEPS BPLR FENES DVNC XI (FORCEP) IMPLANT
FORCEPS BPLR LNG DVNC XI (INSTRUMENTS) IMPLANT
FORCEPS CADIERE DVNC XI (FORCEP) IMPLANT
FORCEPS PROGRASP DVNC XI (FORCEP) IMPLANT
GAUZE KITTNER 4X5 RF (MISCELLANEOUS) ×4 IMPLANT
GAUZE SPONGE 4X4 12PLY STRL (GAUZE/BANDAGES/DRESSINGS) ×2 IMPLANT
GLOVE BIO SURGEON STRL SZ7 (GLOVE) ×2 IMPLANT
GLOVE BIO SURGEON STRL SZ7.5 (GLOVE) ×8 IMPLANT
GOWN STRL REUS W/ TWL LRG LVL3 (GOWN DISPOSABLE) ×4 IMPLANT
GOWN STRL REUS W/ TWL XL LVL3 (GOWN DISPOSABLE) ×4 IMPLANT
GRASPER ENDOPATH ANVIL 10MM (MISCELLANEOUS) IMPLANT
GRASPER SUT TROCAR 14GX15 (MISCELLANEOUS) ×2 IMPLANT
GRASPER TIP-UP FEN DVNC XI (INSTRUMENTS) IMPLANT
HEMOSTAT SURGICEL 2X14 (HEMOSTASIS) IMPLANT
IRRIGATOR SUCT 8 DISP DVNC XI (IRRIGATION / IRRIGATOR) IMPLANT
IV NS 1000ML BAXH (IV SOLUTION) IMPLANT
J-TUBE MIC 16FX51 UNV ENFIT (TUBING) IMPLANT
J-TUBE MIC 18FX51 UNV ENFIT (TUBING) IMPLANT
KIT BASIN OR (CUSTOM PROCEDURE TRAY) ×2 IMPLANT
KIT DILATOR VASC 18G NDL (KITS) IMPLANT
KIT TURNOVER KIT B (KITS) ×2 IMPLANT
MARKER SKIN DUAL TIP RULER LAB (MISCELLANEOUS) ×2 IMPLANT
NDL DRIVE SUT CUT DVNC (INSTRUMENTS) IMPLANT
NEEDLE DRIVE SUT CUT DVNC (INSTRUMENTS) IMPLANT
NS IRRIG 1000ML POUR BTL (IV SOLUTION) ×4 IMPLANT
OIL SILICONE PENTAX (PARTS (SERVICE/REPAIRS)) IMPLANT
PACK CHEST (CUSTOM PROCEDURE TRAY) ×2 IMPLANT
PAD ARMBOARD 7.5X6 YLW CONV (MISCELLANEOUS) ×4 IMPLANT
PORT ACCESS TROCAR AIRSEAL 12 (TROCAR) IMPLANT
RELOAD ENDO STITCH (ENDOMECHANICALS) ×12 IMPLANT
RELOAD STAPLE 45 2.5 WHT DVNC (STAPLE) IMPLANT
RELOAD STAPLE 45 3.5 BLU DVNC (STAPLE) IMPLANT
RELOAD STAPLE 45 4.3 GRN DVNC (STAPLE) IMPLANT
RELOAD STAPLER 2.5X45 WHT DVNC (STAPLE) ×4 IMPLANT
RELOAD STAPLER 3.5X45 BLU DVNC (STAPLE) ×6 IMPLANT
RELOAD STAPLER 4.3X45 GRN DVNC (STAPLE) ×24 IMPLANT
RELOAD SUT TRIPLE-STITCH 2-0 (ENDOMECHANICALS) IMPLANT
RETRACTOR WOUND ALXS 19CM XSML (INSTRUMENTS) ×2 IMPLANT
RTRCTR WOUND ALEXIS 19CM XSML (INSTRUMENTS) ×4 IMPLANT
SCISSORS LAP 5X35 DISP (ENDOMECHANICALS) IMPLANT
SCISSORS MNPLR CVD DVNC XI (INSTRUMENTS) IMPLANT
SEAL UNIV 5-12 XI (MISCELLANEOUS) ×10 IMPLANT
SEALER SYNCHRO 8 IS4000 DVNC (MISCELLANEOUS) ×2 IMPLANT
SET GASTRO INIT PLACEMENT MED (SET/KITS/TRAYS/PACK) ×2 IMPLANT
SET TRI-LUMEN FLTR TB AIRSEAL (TUBING) ×2 IMPLANT
SHEATH PEEL AWAY 13X20F (SHEATH) IMPLANT
STAPLER 45 SUREFORM DVNC (STAPLE) IMPLANT
STAPLER CIRC 25MM 4.8MM THK (STAPLE) IMPLANT
STAPLER RELOAD 2.5X45 WHT DVNC (STAPLE) ×4 IMPLANT
STAPLER RELOAD 3.5X45 BLU DVNC (STAPLE) ×6 IMPLANT
STAPLER RELOAD 4.3X45 GRN DVNC (STAPLE) ×24 IMPLANT
STAPLER TRANS-ORAL 25MM EEA (STAPLE) IMPLANT
STOPCOCK 4 WAY LG BORE MALE ST (IV SETS) ×2 IMPLANT
SUT ETHIBOND 0 36 GRN (SUTURE) IMPLANT
SUT SILK 1 MH (SUTURE) ×2 IMPLANT
SUT SILK 2 0 SH (SUTURE) ×2 IMPLANT
SUT SURGIDAC NAB ES-9 0 48 120 (SUTURE) ×4 IMPLANT
SUT VIC AB 2-0 CT1 36 (SUTURE) IMPLANT
SUT VIC AB 2-0 CT1 TAPERPNT 27 (SUTURE) IMPLANT
SUT VIC AB 2-0 SH 27XBRD (SUTURE) ×2 IMPLANT
SUT VIC AB 3-0 SH 27X BRD (SUTURE) ×8 IMPLANT
SUT VIC AB 3-0 SH 8-18 (SUTURE) ×2 IMPLANT
SUT VICRYL 0 UR6 27IN ABS (SUTURE) ×8 IMPLANT
SUT VLOC 180 0 9IN GS21 (SUTURE) ×2 IMPLANT
SUT VLOC 180 2-0 6IN GS21 (SUTURE) IMPLANT
SYR 10ML LL (SYRINGE) ×2 IMPLANT
SYR 20ML ECCENTRIC (SYRINGE) ×2 IMPLANT
SYR 20ML LL LF (SYRINGE) ×2 IMPLANT
SYR 50ML LL SCALE MARK (SYRINGE) ×2 IMPLANT
SYSTEM SAHARA CHEST DRAIN ATS (WOUND CARE) ×2 IMPLANT
TAPE CLOTH SURG 4X10 WHT LF (GAUZE/BANDAGES/DRESSINGS) IMPLANT
TOWEL GREEN STERILE (TOWEL DISPOSABLE) ×4 IMPLANT
TOWEL GREEN STERILE FF (TOWEL DISPOSABLE) ×4 IMPLANT
TRAY FOLEY MTR SLVR 16FR STAT (SET/KITS/TRAYS/PACK) ×2 IMPLANT
TROCAR XCEL BLADELESS 5X75MML (TROCAR) ×2 IMPLANT
TROCAR XCEL NON-BLD 5MMX100MML (ENDOMECHANICALS) IMPLANT
TUBE CONNECTING 20X1/4 (TUBING) ×2 IMPLANT
TUBE JEJUNAL 16FR ENFIT (TUBING) ×2 IMPLANT
TUBE JEJUNAL 18FR ENFIT (TUBING) IMPLANT
TUBING ENDO SMARTCAP (MISCELLANEOUS) ×2 IMPLANT
TUBING EXTENTION W/L.L. (IV SETS) ×2 IMPLANT
TUBING LAP HI FLOW INSUFFLATIO (TUBING) ×2 IMPLANT
UNDERPAD 30X36 HEAVY ABSORB (UNDERPADS AND DIAPERS) ×2 IMPLANT
WATER STERILE IRR 1000ML POUR (IV SOLUTION) ×4 IMPLANT
WIRE BENTSON .035X145CM (WIRE) IMPLANT
WIRE EMERALD 3MM-J .035X150CM (WIRE) IMPLANT

## 2023-07-28 NOTE — Interval H&P Note (Signed)
 History and Physical Interval Note:  07/28/2023 7:29 AM  Seth Burch  has presented today for surgery, with the diagnosis of ESOPHAGEAL CANCER.  The various methods of treatment have been discussed with the patient and family. After consideration of risks, benefits and other options for treatment, the patient has consented to  Procedure(s): XI ROBOTIC ASSISTED IVOR LEWIS ESOPHAGECTOMY (N/A) EGD (ESOPHAGOGASTRODUODENOSCOPY) (N/A) as a surgical intervention.  The patient's history has been reviewed, patient examined, no change in status, stable for surgery.  I have reviewed the patient's chart and labs.  Questions were answered to the patient's satisfaction.     [name]

## 2023-07-28 NOTE — Op Note (Signed)
 [address]       [name] [address]             [phone]        07/28/2023  Patient:  Seth Burch Pre-Op Dx: Distal Esophageal Adenocarcinoma  T3N2M0 Post-op Dx:  same Procedure: - Esophagoscopy - Robotic assisted laparoscopy - Robotic assisted thoracoscopy - Ivor-Lewis esophagectomy - Pyloromyotomy - Laparoscopic jejunostomy tube placement 68F - Intercostal nerve block   Surgeon and Role:      * Dvaughn Fickle, Eliezer Lofts, MD - Primary  Assistant: Jaclyn Prime, PA-C  An experienced assistant was required given the complexity of this surgery and the standard of surgical care. The assistant was needed for exposure, dissection, suctioning, retraction of delicate tissues and sutures, instrument exchange and for overall help during this procedure.   Anesthesia  general EBL:  Blood Administration: none Specimen:  esophagogastrectomy   Counts: correct   Indications: 68yo male with Seiwert II distal esophageal adenocarcinoma.  He has completed his neoadjuvant radiation and chemotherapy.  Most recent PET/CT shows a good response.  His nutritional labs show an albumin of 4.2.  He has done well with this.  He has been able to resume eating without much complications.  He has also been able to put on some weight.  We discussed the risks and benefits of robotic assisted Ivor Lewis esophagectomy.  He is agreeable to proceed   Findings: Good response.  Radiation changes in the mediastinum.    Operative Technique: After the risks, benefits and alternatives were thoroughly discussed, the patient was brought to the operative theatre.  Anesthesia was induced, and the esophagoscope was passed through the oropharynx down to the stomach.  The scope was retroflexed.  On retrograde examination of the esophagus, here was good response to neoadjuvant therapy.  The scope was then parked at 25 cm from the incisors.  The patient was then prepped and draped in normal  sterile fashion.  An appropriate surgical pause was performed, and pre-operative antibiotics were dosed accordingly.  We began with a 1 cm incision 15 cm caudad from the xiphoid and slightly lateral to the umbilicus.  Using an Optiview we entered the peritoneal space.  The abdomen was then insufflated with CO2.  3 other robotic ports were placed to triangulate the hiatus.  Another 12 mm port was placed in place at the level of the umbilicus laterally for an assistant port and another 5 mm trocar was placed in the right lower quadrant for liver retractor.  The patient was then placed in steep reverse Trendelenburg and the liver was elevated to expose the esophageal hiatus.  And then the robot was docked.  We began by dividing the gastrohepatic ligament to expose the right diaphragmatic crus and then dissected the esophagus into the mediastinum.  We then divided the short gastrics and moved towards the right crus and completed our dissection along the esophageal hiatus.  A Penrose drain was then used to encircle the the esophagus and we continued our dissection up into the mediastinum.  The stomach was then retracted superiorly, and we mobilized it off of the pancreas.  The left gastric artery was then isolated and divided with a robotic stabler.  We then marked an area away from the right gastroepiploic artery, and began to divide the omentum.    Once we achieved good mobilization, we focused our attention on the pylorus.  A 3cm longitudinal incision.  Along the muscle, the mucosa  was evident and without thermal injury.  We then began to tubularized the gastric conduit with several fires of the robotic stapler.  Once complete, the conduit was then attached to the distal end of the specimen.  We then undocked the robot, after removing all instruments, and the liver retractor, and then focused our attention placement of the jejunostomy tube.  The ligament of Treitz was identified, and a portion of small bowel  about 20-30cm distal was used.  Corner stiches were placed, and passed out through the abdominal wall.  Using Seldinger technique, we access the small bowel and confirmed its position with insufflation.  The tract was sequentially dilated, and we passed a 22F jejunostomy tube distally.  The sutures were secured, and another proximal stitch was placed to prevent torsion.  The pneumoperitoneum was released, and all ports were removed.  The incisions were closed with absorbable suture.  The esophagoscope was removed.  The patient was then placed in a left lateral decubitus position.  The robotic ports were placed to triangulate the esophagus.  We continued our mobilization of the esophagus up to the azygous vein.  The azygous vein was divided with a robotic stapler.  The esophagus was then mobilized circumferentially, and then divided at the level of the azygous vein.  We then mobilized the esophagus along with the gastric conduit into the chest.  We ensured that the staple line in the conduit was oriented appropriately.  A small gastrotomy was then made and using the robotic stapler the back row the anastomosis between the gastric conduit and the proximal esophagus was then created.  Then using 2-0 V-Loc sutures the front row of the anastomosis was closed in 2 layers.  A vascularized omental fat pad was then used to buttress the anastomosis.  The ports were removed, and a 20F chest tube was placed.  An intercostal nerve block was performed.  The lung was expanded.  The incisions were closed with absorbable suture.    The patient was then placed back in a supine position and the gastroscope was then inserted through the oropharynx and passed down.  The anastomosis appeared intact.  We then passed the gastroscope down under direct visualization into the gastric conduit by the pylorus.  The patient tolerated the procedure without any immediate complications, and was transferred to the PACU in stable  condition.  Seth Burch

## 2023-07-28 NOTE — Brief Op Note (Signed)
 07/28/2023  4:18 PM  PATIENT:  Placido Sou  68 y.o. male  PRE-OPERATIVE DIAGNOSIS:  ESOPHAGEAL CANCER  POST-OPERATIVE DIAGNOSIS:  ESOPHAGEAL CANCER  PROCEDURE: EGD (ESOPHAGOGASTRODUODENOSCOPY), XI ROBOTIC ASSISTED IVOR LEWIS ESOPHAGECTOMY, and  PLACEMENT OF FEEDING JEJUNOSTOMY TUBE  SURGEON:  Surgeons and Role:    [name], MD - Primary  PHYSICIAN ASSISTANT: [name] PA-C, Zygmunt Mcglinn Janeann Forehand, Rivka Barbara PA Student  ASSISTANTS: none   ANESTHESIA:   general  EBL: Per anesthesia record  BLOOD ADMINISTERED:none  DRAINS: none   LOCAL MEDICATIONS USED:  Blake Drain, Right chest tube, J tube  SPECIMEN:  Source of Specimen:  Esophagus, Remnant of stomach  DISPOSITION OF SPECIMEN:  PATHOLOGY  COUNTS CORRECT:  YES  DICTATION: .Dragon Dictation  PLAN OF CARE: Admit to inpatient   PATIENT DISPOSITION:  ICU - extubated and stable.   Delay start of Pharmacological VTE agent (>24hrs) due to surgical blood loss or risk of bleeding: yes

## 2023-07-28 NOTE — Anesthesia Preprocedure Evaluation (Signed)
 Anesthesia Evaluation  Patient identified by MRN, date of birth, ID band Patient awake    Reviewed: Allergy & Precautions, NPO status , Patient's Chart, lab work & pertinent test results, reviewed documented beta blocker date and time   History of Anesthesia Complications Negative for: history of anesthetic complications  Airway Mallampati: II  TM Distance: >3 FB Neck ROM: Full    Dental no notable dental hx.    Pulmonary neg COPD, neg PE   breath sounds clear to auscultation       Cardiovascular hypertension, (-) CAD, (-) Past MI, (-) Cardiac Stents and (-) CABG  Rhythm:Regular Rate:Normal     Neuro/Psych neg Seizures    GI/Hepatic ,neg GERD  ,,(+) neg Cirrhosis      Esophageal cancer   Endo/Other  diabetes, Type 2    Renal/GU CRFRenal disease     Musculoskeletal  (+) Arthritis ,    Abdominal   Peds  Hematology   Anesthesia Other Findings   Reproductive/Obstetrics                              Anesthesia Physical Anesthesia Plan  ASA: 3  Anesthesia Plan: General   Post-op Pain Management: Ketamine IV*, Dilaudid IV and Ofirmev IV (intra-op)*   Induction: Intravenous  PONV Risk Score and Plan: 2 and Ondansetron and Dexamethasone  Airway Management Planned: Double Lumen EBT and Oral ETT  Additional Equipment: ClearSight  Intra-op Plan:   Post-operative Plan: Extubation in OR  Informed Consent: I have reviewed the patients History and Physical, chart, labs and discussed the procedure including the risks, benefits and alternatives for the proposed anesthesia with the patient or authorized representative who has indicated his/her understanding and acceptance.     Dental advisory given  Plan Discussed with: CRNA  Anesthesia Plan Comments:          Anesthesia Quick Evaluation

## 2023-07-28 NOTE — Anesthesia Procedure Notes (Signed)
 Procedure Name: Intubation Date/Time: 07/28/2023 7:52 AM  Performed by: Yolonda Kida, CRNAPre-anesthesia Checklist: Patient identified, Emergency Drugs available, Suction available and Patient being monitored Patient Re-evaluated:Patient Re-evaluated prior to induction Oxygen Delivery Method: Circle System Utilized Preoxygenation: Pre-oxygenation with 100% oxygen Induction Type: IV induction Ventilation: Mask ventilation without difficulty Laryngoscope Size: Mac and 4 Grade View: Grade I Tube type: Oral Endobronchial tube: Left, Double lumen EBT, EBT position confirmed by auscultation and EBT position confirmed by fiberoptic bronchoscope and 39 Fr Number of attempts: 1 Airway Equipment and Method: Stylet Placement Confirmation: ETT inserted through vocal cords under direct vision, positive ETCO2 and breath sounds checked- equal and bilateral Secured at: 29 cm Tube secured with: Tape Dental Injury: Teeth and Oropharynx as per pre-operative assessment

## 2023-07-28 NOTE — Transfer of Care (Signed)
 Immediate Anesthesia Transfer of Care Note  Patient: Seth Burch  Procedure(s) Performed: XI ROBOTIC ASSISTED IVOR LEWIS ESOPHAGECTOMY (Chest) EGD (ESOPHAGOGASTRODUODENOSCOPY)  Patient Location: PACU  Anesthesia Type:General  Level of Consciousness: awake and patient cooperative  Airway & Oxygen Therapy: Patient Spontanous Breathing and Patient connected to face mask oxygen  Post-op Assessment: Report given to RN and Post -op Vital signs reviewed and stable  Post vital signs: Reviewed and stable  Last Vitals:  Vitals Value Taken Time  BP 145/88 07/28/23 1801  Temp 36.9 C 07/28/23 1800  Pulse 78 07/28/23 1807  Resp 13 07/28/23 1807  SpO2 95 % 07/28/23 1807  Vitals shown include unfiled device data.  Last Pain:  Vitals:   07/28/23 0610  TempSrc:   PainSc: 0-No pain         Complications: No notable events documented.

## 2023-07-28 NOTE — Anesthesia Procedure Notes (Signed)
 Arterial Line Insertion Start/End3/02/2024 7:00 AM, 07/28/2023 7:10 AM Performed by: Mariann Barter, MD, Yolonda Kida, CRNA, CRNA  Patient location: Pre-op. Preanesthetic checklist: patient identified, IV checked, site marked, risks and benefits discussed, surgical consent, monitors and equipment checked, pre-op evaluation, timeout performed and anesthesia consent Lidocaine 1% used for infiltration Right, radial was placed Catheter size: 20 G Hand hygiene performed  and maximum sterile barriers used   Attempts: 2 Procedure performed without using ultrasound guided technique. Following insertion, dressing applied and Biopatch. Post procedure assessment: normal and unchanged  Patient tolerated the procedure well with no immediate complications.

## 2023-07-28 NOTE — Anesthesia Postprocedure Evaluation (Signed)
 Anesthesia Post Note  Patient: Seth Burch  Procedure(s) Performed: XI ROBOTIC ASSISTED IVOR LEWIS ESOPHAGECTOMY (Chest) EGD (ESOPHAGOGASTRODUODENOSCOPY)     Patient location during evaluation: PACU Anesthesia Type: General Level of consciousness: awake and alert, patient cooperative and oriented Pain management: pain level controlled Vital Signs Assessment: post-procedure vital signs reviewed and stable Respiratory status: spontaneous breathing, nonlabored ventilation, respiratory function stable and patient connected to nasal cannula oxygen Cardiovascular status: blood pressure returned to baseline and stable Postop Assessment: no apparent nausea or vomiting Anesthetic complications: no   No notable events documented.  Last Vitals:  Vitals:   07/28/23 0549 07/28/23 1800  BP: (!) 152/96 (!) 145/88  Pulse: 67 80  Resp: 18 13  Temp: 37.1 C 36.9 C  SpO2: 98% 96%    Last Pain:  Vitals:   07/28/23 1800  TempSrc:   PainSc: 0-No pain                 Alexsander Cavins,E. Sevrin Sally

## 2023-07-29 ENCOUNTER — Encounter: Payer: Self-pay | Admitting: Oncology

## 2023-07-29 ENCOUNTER — Telehealth: Payer: Self-pay

## 2023-07-29 ENCOUNTER — Encounter (HOSPITAL_COMMUNITY): Payer: Self-pay | Admitting: Thoracic Surgery (Cardiothoracic Vascular Surgery)

## 2023-07-29 LAB — POCT I-STAT 7, (LYTES, BLD GAS, ICA,H+H)
Acid-base deficit: 6 mmol/L — ABNORMAL HIGH (ref 0.0–2.0)
Acid-base deficit: 5 mmol/L — ABNORMAL HIGH (ref 0.0–2.0)
Acid-base deficit: 4 mmol/L — ABNORMAL HIGH (ref 0.0–2.0)
Acid-base deficit: 3 mmol/L — ABNORMAL HIGH (ref 0.0–2.0)
Acid-base deficit: 2 mmol/L (ref 0.0–2.0)
Bicarbonate: 20.7 mmol/L (ref 20.0–28.0)
Bicarbonate: 20.1 mmol/L (ref 20.0–28.0)
Bicarbonate: 21.9 mmol/L (ref 20.0–28.0)
Bicarbonate: 22.2 mmol/L (ref 20.0–28.0)
Bicarbonate: 21.8 mmol/L (ref 20.0–28.0)
Calcium, Ion: 1.15 mmol/L (ref 1.15–1.40)
Calcium, Ion: 1.15 mmol/L (ref 1.15–1.40)
Calcium, Ion: 1.19 mmol/L (ref 1.15–1.40)
Calcium, Ion: 1.14 mmol/L — ABNORMAL LOW (ref 1.15–1.40)
Calcium, Ion: 1.1 mmol/L — ABNORMAL LOW (ref 1.15–1.40)
HCT: 29 % — ABNORMAL LOW (ref 39.0–52.0)
HCT: 28 % — ABNORMAL LOW (ref 39.0–52.0)
HCT: 29 % — ABNORMAL LOW (ref 39.0–52.0)
HCT: 29 % — ABNORMAL LOW (ref 39.0–52.0)
HCT: 27 % — ABNORMAL LOW (ref 39.0–52.0)
Hemoglobin: 9.9 g/dL — ABNORMAL LOW (ref 13.0–17.0)
Hemoglobin: 9.5 g/dL — ABNORMAL LOW (ref 13.0–17.0)
Hemoglobin: 9.9 g/dL — ABNORMAL LOW (ref 13.0–17.0)
Hemoglobin: 9.9 g/dL — ABNORMAL LOW (ref 13.0–17.0)
Hemoglobin: 9.2 g/dL — ABNORMAL LOW (ref 13.0–17.0)
O2 Saturation: 99 %
O2 Saturation: 99 %
O2 Saturation: 92 %
O2 Saturation: 100 %
O2 Saturation: 100 %
Potassium: 3.7 mmol/L (ref 3.5–5.1)
Potassium: 4.2 mmol/L (ref 3.5–5.1)
Potassium: 3.7 mmol/L (ref 3.5–5.1)
Potassium: 3.7 mmol/L (ref 3.5–5.1)
Potassium: 3.9 mmol/L (ref 3.5–5.1)
Sodium: 140 mmol/L (ref 135–145)
Sodium: 140 mmol/L (ref 135–145)
Sodium: 141 mmol/L (ref 135–145)
Sodium: 142 mmol/L (ref 135–145)
Sodium: 142 mmol/L (ref 135–145)
TCO2: 22 mmol/L (ref 22–32)
TCO2: 21 mmol/L — ABNORMAL LOW (ref 22–32)
TCO2: 23 mmol/L (ref 22–32)
TCO2: 23 mmol/L (ref 22–32)
TCO2: 23 mmol/L (ref 22–32)
pCO2 arterial: 44.5 mmHg (ref 32–48)
pCO2 arterial: 38.7 mmHg (ref 32–48)
pCO2 arterial: 41.6 mmHg (ref 32–48)
pCO2 arterial: 38.5 mmHg (ref 32–48)
pCO2 arterial: 33.9 mmHg (ref 32–48)
pH, Arterial: 7.276 — ABNORMAL LOW (ref 7.35–7.45)
pH, Arterial: 7.324 — ABNORMAL LOW (ref 7.35–7.45)
pH, Arterial: 7.33 — ABNORMAL LOW (ref 7.35–7.45)
pH, Arterial: 7.37 (ref 7.35–7.45)
pH, Arterial: 7.416 (ref 7.35–7.45)
pO2, Arterial: 159 mmHg — ABNORMAL HIGH (ref 83–108)
pO2, Arterial: 145 mmHg — ABNORMAL HIGH (ref 83–108)
pO2, Arterial: 67 mmHg — ABNORMAL LOW (ref 83–108)
pO2, Arterial: 181 mmHg — ABNORMAL HIGH (ref 83–108)
pO2, Arterial: 186 mmHg — ABNORMAL HIGH (ref 83–108)

## 2023-07-29 LAB — CBC
HCT: 32.3 % — ABNORMAL LOW (ref 39.0–52.0)
Hemoglobin: 10.9 g/dL — ABNORMAL LOW (ref 13.0–17.0)
MCH: 32.9 pg (ref 26.0–34.0)
MCHC: 33.7 g/dL (ref 30.0–36.0)
MCV: 97.6 fL (ref 80.0–100.0)
Platelets: 211 10*3/uL (ref 150–400)
RBC: 3.31 MIL/uL — ABNORMAL LOW (ref 4.22–5.81)
RDW: 15.3 % (ref 11.5–15.5)
WBC: 6.1 10*3/uL (ref 4.0–10.5)
nRBC: 0 % (ref 0.0–0.2)

## 2023-07-29 LAB — BASIC METABOLIC PANEL
Anion gap: 7 (ref 5–15)
BUN: 19 mg/dL (ref 8–23)
CO2: 22 mmol/L (ref 22–32)
Calcium: 8 mg/dL — ABNORMAL LOW (ref 8.9–10.3)
Chloride: 109 mmol/L (ref 98–111)
Creatinine, Ser: 1.5 mg/dL — ABNORMAL HIGH (ref 0.61–1.24)
GFR, Estimated: 51 mL/min — ABNORMAL LOW (ref >60)
Glucose, Bld: 190 mg/dL — ABNORMAL HIGH (ref 70–99)
Potassium: 4.2 mmol/L (ref 3.5–5.1)
Sodium: 138 mmol/L (ref 135–145)

## 2023-07-29 LAB — BLOOD GAS, ARTERIAL
Acid-base deficit: 2 mmol/L (ref 0.0–2.0)
Bicarbonate: 22.3 mmol/L (ref 20.0–28.0)
O2 Saturation: 95.2 %
Patient temperature: 36.8
pCO2 arterial: 36 mmHg (ref 32–48)
pH, Arterial: 7.4 (ref 7.35–7.45)
pO2, Arterial: 70 mmHg — ABNORMAL LOW (ref 83–108)

## 2023-07-29 LAB — GLUCOSE, CAPILLARY
Glucose-Capillary: 167 mg/dL — ABNORMAL HIGH (ref 70–99)
Glucose-Capillary: 201 mg/dL — ABNORMAL HIGH (ref 70–99)
Glucose-Capillary: 186 mg/dL — ABNORMAL HIGH (ref 70–99)
Glucose-Capillary: 178 mg/dL — ABNORMAL HIGH (ref 70–99)
Glucose-Capillary: 194 mg/dL — ABNORMAL HIGH (ref 70–99)

## 2023-07-29 MED ORDER — METHOCARBAMOL 1000 MG/10ML IJ SOLN
500.0000 mg | Freq: Four times a day (QID) | INTRAMUSCULAR | Status: DC | PRN
  Administered 2023-07-29 – 2023-08-04 (×7): 500 mg via INTRAVENOUS
  Filled 2023-07-29 (×7): qty 10

## 2023-07-29 MED ORDER — HYDRALAZINE HCL 20 MG/ML IJ SOLN
10.0000 mg | Freq: Four times a day (QID) | INTRAMUSCULAR | Status: DC
  Administered 2023-07-29 – 2023-08-02 (×17): 10 mg via INTRAVENOUS
  Filled 2023-07-29 (×18): qty 1

## 2023-07-29 MED ORDER — ACETAMINOPHEN 160 MG/5ML PO SOLN
650.0000 mg | Freq: Four times a day (QID) | ORAL | Status: DC | PRN
  Administered 2023-07-29 – 2023-07-30 (×3): 650 mg via ORAL
  Filled 2023-07-29 (×3): qty 20.3

## 2023-07-29 MED ORDER — OSMOLITE 1.2 CAL PO LIQD
1000.0000 mL | ORAL | Status: DC
  Administered 2023-07-29: 1000 mL
  Filled 2023-07-29: qty 1000

## 2023-07-29 MED ORDER — OSMOLITE 1.5 CAL PO LIQD
1000.0000 mL | ORAL | Status: DC
Start: 2023-07-29 — End: 2023-07-30
  Administered 2023-07-30: 1000 mL

## 2023-07-29 MED ORDER — OXYCODONE HCL 5 MG/5ML PO SOLN
5.0000 mg | ORAL | Status: DC | PRN
  Administered 2023-07-29 – 2023-07-30 (×4): 5 mg
  Filled 2023-07-29 (×4): qty 5

## 2023-07-29 NOTE — Progress Notes (Signed)
 Initial Nutrition Assessment  DOCUMENTATION CODES:   Non-severe (moderate) malnutrition in context of chronic illness  INTERVENTION:   Tube Feeding via J-Tube:  Osmolite 1.5 at 20 ml/hr today, no titration Goal TF: Osmolite 1.5 at 65 ml/hr  TF at goal provides 2340 kcals, 98 g of protein and 1185 mL of free water  Add Pro-Source TF20 60 mL daily currently for increased needs post-op. Provides additional 20 g of protein  Pt will need additional free water flushes to meet hydration needs while NPO. Do not recommend starting free water until pt demonstrating tolerance of trickle TF with advancement.  Free water flush of 150 mL q 4 hour provides additional 900 mL of free water per day  Initial nutrition education provided but further education, including handouts to be provided, pending discharge nutrition poc, diet advancement, etc  NUTRITION DIAGNOSIS:   Moderate Malnutrition related to chronic illness (esophageal cancer s/p chemo, radiation and surgery) as evidenced by percent weight loss, mild fat depletion, moderate muscle depletion.   GOAL:   Patient will meet greater than or equal to 90% of their needs   MONITOR:   TF tolerance, Labs, Weight trends, Diet advancement, Skin, I & O's  REASON FOR ASSESSMENT:   Consult Enteral/tube feeding initiation and management, Assessment of nutrition requirement/status (New J-tube post Esophagectomy)  ASSESSMENT:   68 yo male with hx of primary adenocarcinoma of distal third esophagus s/p chemo and radiation and admitted for scheduled esophagectomy.  PMH includes DM, HTN, dyslipidemia, CKD, IgG kappa MGUS  3/10 EGD, XI Robotic Assisted Ivor Lewis Esophagectomy, J-tube placement 3/11 Trickle TF initiated  Strict NPO, NG to LIS. Noted tentative plan for swallow study on Monday with NG to remain in place until then New 16 fr J-tube in place, Osmolite 1.5 infusing at 20 ml/hr  Pt denies any nausea. No BM yet, abdomen soft. Pt reports  significant pain on visit today, appears very uncomfortable. RN notified. RD obtained some nutritional history from pt but did not push given how uncomfortable pt was at time of visit. Pt reports he was drinking some [employer]ate brand shakes at home prior to admission, reports appetite, po intake and ability to swallow waxed and waned over the past year with pt indicating current intake prior to admission was fairly good.   Noted pt was seen by an outpatient RD in Jan 2025 for blood sugar management, esophageal cancer.   Pt with hx of difficulty swallowing in Jan/Feb 2024 with unintentional wt loss. Reported 40-50 pound wt loss in November 2024 (new dx of esophageal cancer) Pt started radiation and chemo treatment in December 2024. UBW around 240 pounds in Jan 2024, reports rencet wt around 180 kg. Current wt 90.5 kg, question accuracy of weights  Pt reports no decline in physical function recently.   Current Wt: 90.5 kg (199.5 pounds); question accuracy of weights 07/29/23 90.5 kg  07/24/23 82.1 kg  07/18/23 83 kg  07/11/23 83 kg  07/03/23 80.2 kg  06/30/23 79.4 kg  06/12/23 82.6 kg  05/30/23 80.6 kg  05/22/23 81 kg  05/16/23 82.1 kg     Labs: CBG 98-201 Meds: reglan, ss novolog   NUTRITION - FOCUSED PHYSICAL EXAM:  Flowsheet Row Most Recent Value  Orbital Region Mild depletion  Upper Arm Region Moderate depletion  Thoracic and Lumbar Region Mild depletion  Buccal Region Mild depletion  Temple Region Mild depletion  Clavicle Bone Region Mild depletion  Clavicle and Acromion Bone Region Mild depletion  Scapular Bone Region  Mild depletion  Dorsal Hand Unable to assess  Patellar Region Moderate depletion  Anterior Thigh Region Moderate depletion  Posterior Calf Region Moderate depletion  Edema (RD Assessment) None       Diet Order:   Diet Order             Diet NPO time specified  Diet effective now                   EDUCATION NEEDS:   Education needs have been  addressed  Skin:  Skin Assessment: Skin Integrity Issues: Skin Integrity Issues:: Incisions Incisions: new chest tube, new J-tube (07/28/2022)  Last BM:  PTA  Height:   Ht Readings from Last 1 Encounters:  07/28/23 5\' 10"  (1.778 m)    Weight:   Wt Readings from Last 1 Encounters:  07/29/23 90.5 kg    BMI:  Body mass index is 28.63 kg/m.  Estimated Nutritional Needs:   Kcal:  2200-2400 kcals  Protein:  115-130 g  Fluid:  >/= 2L    Romelle Starcher MS, RDN, LDN, CNSC Registered Dietitian 3 Clinical Nutrition RD Inpatient Contact Info in Amion

## 2023-07-29 NOTE — Progress Notes (Addendum)
      [address]       [name] [address]             [phone]      1 Day Post-Op Procedure(s) (LRB): XI ROBOTIC ASSISTED IVOR LEWIS ESOPHAGECTOMY (N/A) EGD (ESOPHAGOGASTRODUODENOSCOPY) (N/A)  Subjective:  Patient not doing great.  Complains of right shoulder and chest pain.  Explained this is due to the chest tube.  He has also been unable to void, bladder residuals showing 650 cc.  Objective: Vital signs in last 24 hours: Temp:  [98 F (36.7 C)-98.4 F (36.9 C)] 98.3 F (36.8 C) (03/11 0327) Pulse Rate:  [73-91] 91 (03/11 0327) Cardiac Rhythm: Normal sinus rhythm (03/10 2000) Resp:  [12-19] 12 (03/11 0327) BP: (145-172)/(70-88) 145/70 (03/11 0327) SpO2:  [92 %-96 %] 95 % (03/11 0327) Weight:  [90 kg-90.5 kg] 90.5 kg (03/11 0327)  Intake/Output from previous day: 03/10 0701 - 03/11 0700 In: 6450 [I.V.:5100; IV Piggyback:1350] Out: 1178 [Urine:500; Drains:40; Blood:200; Chest Tube:438]  General appearance: alert, cooperative, and no distress Heart: regular rate and rhythm Lungs: clear to auscultation bilaterally Abdomen: soft, non-tender; bowel sounds normal; no masses,  no organomegaly Extremities: extremities normal, atraumatic, no cyanosis or edema Wound: clean, + serous drainage  Lab Results: Recent Labs    07/29/23 0218  WBC 6.1  HGB 10.9*  HCT 32.3*  PLT 211   BMET:  Recent Labs    07/29/23 0218  NA 138  K 4.2  CL 109  CO2 22  GLUCOSE 190*  BUN 19  CREATININE 1.50*  CALCIUM 8.0*    PT/INR: No results for input(s): "LABPROT", "INR" in the last 72 hours. ABG    Component Value Date/Time   PHART 7.4 07/29/2023 0514   HCO3 22.3 07/29/2023 0514   ACIDBASEDEF 2.0 07/29/2023 0514   O2SAT 95.2 07/29/2023 0514   CBG (last 3)  Recent Labs    07/28/23 2029 07/28/23 2343 07/29/23 0442  GLUCAP 171* 162* 167*    Assessment/Plan: S/P Procedure(s) (LRB): XI ROBOTIC ASSISTED IVOR LEWIS ESOPHAGECTOMY (N/A) EGD  (ESOPHAGOGASTRODUODENOSCOPY) (N/A)  CV- Sinus Tach, + HTN-known history of HTN at home... continue IV Hydralazine for HTN.[name] elevated HR likely related to pain will monitor.[name] if persists can start liquid lopressor Pulm- CXR with trace right pneumothorax, no air leak from chest tube...438 cc since surgery.. JP drain with minimal output Renal- creatinine up to 1.50,  has baseline of 1.3-1.4.[name] continue IV hydration GI- will advance tube feedings slow.. up to 54ml/hr today... plan for swallow study Monday.. NG tube to remain in place until then GU- urinary retention... on flomax prior to admission unfortunately we do not have this in liquid dosing.. nurse will I/O if needed... if requires a seconding occurrence will plan to reinsert foley catheter Pain control- continue Morphine, oxycodone, tylenol, add robaxin prn muscle spasms.. unfortunately can't use Toradol in setting of elevated creatinine H/O DM- sugars are stable, continue insulin SSIP Dispo- patient stable, doing well.. complaints are not unexpected, adjustments as above   LOS: 1 day    [name], PA-C 07/29/2023  Agree with above Adjusting pain medication Swallow on Monday  Torrey Horseman O Tiffnay Bossi

## 2023-07-29 NOTE — Discharge Summary (Signed)
 Physician Discharge Summary  Patient ID: Seth Burch MRN: [id number] DOB/AGE: 11-27-1955 68 y.o.  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) 03/11/2023   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   Discharge Diagnoses:   Patient Active Problem List   Diagnosis Date Noted   Malnutrition of moderate degree 07/30/2023   Esophageal cancer (HCC) 07/28/2023   H/O esophagectomy 07/28/2023   Chemotherapy-induced thrombocytopenia 05/22/2023   Port-A-Cath in place 05/02/2023   Leukopenia due to antineoplastic chemotherapy (HCC) 05/02/2023   CKD (chronic kidney disease) stage 3, GFR 30-59 ml/min (HCC) 04/24/2023   Dysphagia 03/27/2023   Unintentional weight loss 03/27/2023   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  Clinical stage III.  He underwent EUS on 04/04/2023 by Dr. Elnoria Howard and was felt to be at least T3, N1/2, MX.  He underwent radiation and chemotherapy in December with last dose being 05/30/2023.  Restaging PET CT scan was obtained on 06/30/2023, which showed response with substantial improvement in  hypermetabolic activity within the distal esophagus consistent with a positive response to therapy.  He followed up with Dr. Cliffton Asters on 07/11/2023 at which time the patient was able to eat without complications and was able to put on some weight.  He was offered a Robotic Assisted Ivor Lewis Esophagectomy.  The risks and benefits of the procedure were explained to the patient and he was agreeable to proceed.  Hospital Course:  Seth Burch presented to [facility] on 07/28/2023.  He was taken to the operating room and underwent Robotic Assisted Ivor Lewis Esophagectomy and Placement of Jejunostomy tube. (Full procedure posted below).  He tolerated the procedure without difficulty, was extubated, and taken the PACU in stable condition.  The patient was started on tube feedings at 10 ml per hour the evening of surgery.  These were advanced slowly as he tolerated.  He was tachycardic and hypertensive post operatively, that was likely exacerbated by pain.  He was treated with IV hydralazine and low dose lopressor solution was also added to help with tachycardia.   His chest x-ray showed a trace right apical pneumothorax.  CT output was serous.  NG tube drainage was also reasonable and will be removed prior to his swallow study.  Pain control was difficult and IV Robaxin was added.  He has known BPH and developed urinary retention post operatively.  He required in and out cath with removal of 700 cc of urine.  Has been voiding without difficulty since that time.  He developed low grade fever felt likely to be stress response from surgery.  This was monitored very closely.  He continued to have issues with pain control.  IV pain medication was discontinued and he was started  on a PCA pump for better control.  This caused the patient to be very somnolent and was discontinued.  He developed worsening AKI on CKD with creatinine level peaking at 2.49.  This improved with albumin and IV fluid administration.  He  underwent placement of foley catheter for accurate urinary output measurements.  He was also treated with albumin to help with mild dehydration.  It was felt best the patient be transferred to the ICU for closer monitoring and critical care consult was obtained.  His tube feeds were placed to goal on 3/14.  He has tolerated this without any nausea or vomiting.  He developed respiratory distress. He was given Duo nebs but did not help. He became agitated. He was given Precedex and Dexamethasone and put on bi pap. Ultimately, he was re intubated the evening of 03/15. He was put on Vancomycin and Zosyn to cover PNA. He underwent a bronchoscopy 03/15 and thick mucous in trachea was suctioned. Per pulmonary, suspicion is aspiration and mucus plugging with recurrent false vocal cord irritation leading to swelling, stridor, respiratory  failure.  He was able to be re-extubated on 08/04/2023.  He required support with Levophed which was weaned off as hemodynamics allowed.  He developed drainage around J Tube and topical Gerhardt's butt cream was ordered and dressing changes as needed.  He underwent Esophogram on 08/05/2023 which showed no evidence of anastomotic leak.  However there was brisk aspiration of contrast media into Left mainstem bronchus.  Speech and Language pathology consultation was obtained.  They evaluated patient and performed MBS which showed patient's pharyngeal edema is affecting his swallowing.  He was placed on Dysphagia 2 diet and nectar thickened liquids per recommendations.  He was felt to be stable for transfer to the progressive care unit on 08/07/2023.  The patient developed uncontrollable diarrhea initially but this is soon slow but steady improvement.[name]  He was started on Banatrol to help with this.  He developed persistent bilious drainage from his J tube with some leakage of tube feedings when running.  Diligent wound care was performed.  Tube feeding rate was decreased.  He was also transitioned  to a different formulation to see if this helps with his diarrhea. He developed a fever without an obvious source.  This was closely monitored.  Over the next 2 days he did defervesce.  Additionally leukocytosis resolved.  He was transitioned back on oral medications as tolerated.  The patient did not wish to go to CIR at time of discharge.  Home health orders were placed and arrangements will be made prior to discharge.  He developed urinary retention and flomax was started.  This helped and patient was able to void with minimal difficulty.  He developed fever and leukcytosis.  He was started on Meropenem and Diflucan.  Repeat Esophogram was obtained and continued to show no evidence of anastomotic leak.  Chest x-ray was obtained and showed evidence of right pleural effusion.  Urinalysis was obtained and was negative for UTI.  He was restarted on a dysphagia 3 diet.  His antibiotics were discontinued by Dr. Cliffton Asters on 3/27.  The patient's oral intake improved.  Calorie count was initiated and revealed  the patient did not meet caloric or protein goals.  He will require tube feedings at discharge, Vivonex at a rate of 70 ml/hr.  His JP drain output remained low and was removed prior to discharge.  His surgical incisions are healing without evidence of infection.  He is medically stable  for discharge home today.  Consults:  nutrition  Significant Diagnostic Studies: nuclear medicine:   FINDINGS: Mediastinal blood pool activity: SUV max 2.8   NECK:   No hypermetabolic cervical lymph nodes are identified. No suspicious activity identified within the pharyngeal mucosal space.   Incidental CT findings: none   CHEST:   Previously demonstrated long segment hypermetabolic activity within the distal esophagus has substantially improved in the interval. This now has an SUV max of 5.7 (previously 26.3). There is residual distal esophageal wall thickening. No residual hypermetabolic paraesophageal lymph  nodes. No new hypermetabolic mediastinal, hilar or axillary lymph nodes. No hypermetabolic pulmonary activity or suspicious nodularity.   Incidental CT findings: Mild aortic atherosclerosis. Right IJ Port-A-Cath extends to the superior cavoatrial junction.   ABDOMEN/PELVIS:   There is no hypermetabolic activity within the liver, adrenal glands, spleen or pancreas. There is no hypermetabolic nodal activity in the abdomen or pelvis.   Incidental CT findings: Unchanged left renal cysts for which no specific follow-up imaging is recommended. Stable prostatomegaly and prominent fat in both inguinal canals. Mild aortoiliac atherosclerosis.   SKELETON:   There is no hypermetabolic activity to suggest osseous metastatic disease.   Incidental CT findings: Decreased marrow activity in the lower thoracic spine attributed to interval radiation therapy. Mild spondylosis.   IMPRESSION: 1. Substantial interval improvement in hypermetabolic activity within the distal esophagus, consistent with response to therapy. No residual or progressive adjacent nodal disease demonstrated. 2. No evidence of metastatic disease. 3.  Aortic Atherosclerosis (ICD10-I70.0).     Electronically Signed   By: Carey Bullocks M.D.   On: 07/03/2023 11:24  Treatments: surgery:   07/28/2023   Patient:  Placido Sou Pre-Op Dx: Distal Esophageal Adenocarcinoma  T3N2M0 Post-op Dx:  same Procedure: - Esophagoscopy - Robotic assisted laparoscopy - Robotic assisted thoracoscopy - Ivor-Lewis esophagectomy - Pyloromyotomy - Laparoscopic jejunostomy tube placement 63F - Intercostal nerve block     Surgeon and Role:      * Lightfoot, Eliezer Lofts, MD - Primary   Assistant: Jaclyn Prime, PA-C  An experienced assistant was required given the complexity of this surgery and the standard of surgical care. The assistant was needed for exposure, dissection, suctioning, retraction of delicate tissues and sutures,  instrument exchange and for overall help during this procedure.    PATHOLOGY:  A. ESOPHAGUS, RESECTION:  -  Very focal minimal residual adenocarcinoma predominantly in the  background of scar with areas of histiocytosis and acellular mucin.  -  Treatment effect-near complete response, score 1 of 3.  -  Margins negative.  -  14 lymph nodes, negative for malignancy.  -  See synoptic report and gross description below.    Discharge Exam: Blood pressure 130/67, pulse 98, temperature 98.8 F (37.1 C), temperature source Oral, resp. rate (!) 22, height 5\' 10"  (1.778 m), weight 77.4 kg, SpO2 99%.  General appearance: alert, cooperative, and no distress Heart: regular rate and rhythm Lungs: clear to auscultation bilaterally Abdomen: soft, non-tender; bowel sounds normal; no masses,  no organomegaly Extremities: extremities normal, atraumatic, no cyanosis or edema Wound: clean and dry  Discharge disposition: 01-Home or Self Care  Allergies as of 08/19/2023   No Known Allergies      Medication List     STOP taking these medications    Jardiance 10 MG Tabs tablet Generic drug: empagliflozin   metFORMIN 500 MG tablet Commonly known as: GLUCOPHAGE   ramipril 10 MG capsule Commonly known as: ALTACE  TAKE these medications    Accu-Chek Guide test strip Generic drug: glucose blood 1 each by Other route 2 (two) times daily.   acetaminophen 325 MG tablet Commonly known as: Tylenol Take 1-2 tablets (325-650 mg total) by mouth every 4 (four) hours as needed.   amLODipine 10 MG tablet Commonly known as: NORVASC Take 1 tablet (10 mg total) by mouth daily. What changed:  medication strength how much to take   Aspirin 81 MG Caps Take 81 mg by mouth daily.   atorvastatin 20 MG tablet Commonly known as: LIPITOR Take 20 mg by mouth daily.   feeding supplement (PROSource TF20) liquid Place 60 mLs into feeding tube 2 (two) times daily.   FreeStyle [employer] 3 Sensor  Misc See admin instructions.   Gerhardt's butt cream Crea Apply 1 Application topically 3 (three) times daily.   Lantus SoloStar 100 UNIT/ML Solostar Pen Generic drug: insulin glargine Inject 26 Units into the skin daily.   metoprolol tartrate 25 MG tablet Commonly known as: LOPRESSOR Take 0.5 tablets (12.5 mg total) by mouth 2 (two) times daily.   omeprazole 40 MG capsule Commonly known as: PRILOSEC Take 40 mg by mouth daily.   oxyCODONE 5 MG immediate release tablet Commonly known as: Oxy IR/ROXICODONE Take 1 tablet (5 mg total) by mouth every 4 (four) hours as needed for severe pain (pain score 7-10).   tamsulosin 0.4 MG Caps capsule Commonly known as: FLOMAX Take 0.4 mg by mouth daily.   Vivonex RTF Liqd 1,000 mLs by Per J Tube route continuous.               Durable Medical Equipment  (From admission, onward)           Start     Ordered   08/11/23 0753  For home use only DME Bedside commode  Once       Comments: Frequent liquid stools due to tube feedings  Question:  Patient needs a bedside commode to treat with the following condition  Answer:  H/O esophagectomy   08/11/23 0753   08/11/23 0753  For home use only DME 3 n 1  Once        08/11/23 0753   08/11/23 0753  For home use only DME Walker rolling  Once       Question Answer Comment  Walker: With 5 Inch Wheels   Patient needs a walker to treat with the following condition H/O esophagectomy      08/11/23 0753   08/11/23 0752  For home use only DME Tube feeding  Once        08/11/23 0753   08/11/23 0752  For home use only DME Tube feeding pump  Once       Question:  Length of Need  Answer:  Lifetime   08/11/23 1610            Follow-up Information     [name], MD Follow up on 08/22/2023.   Specialty: Cardiothoracic Surgery Why: Appointment is at 11:00 Contact information: [address] [place] [address] [phone]         [facility] Follow up.   Why: Home health has been arranged. They will contact you to schedule apt within 48hrs post discharge. Contact information: [address] [place] [address] [phone]         [facility] Follow up.   Why: tube feedings Contact information: [address] [place] [address] [phone]         Shelle Iron, MD Follow up.   Specialty: Family Medicine Why: Please make appointment for 1 week and obtain lab work follow up Contact information: [address] [place] [address] [phone]                 Signed: [name], PA-C  08/19/2023, 8:55 AM

## 2023-07-29 NOTE — Telephone Encounter (Signed)
 S/p Esophagectomy with Dr. Cliffton Asters 07/28/23. Faxed STD paperwork to [place] at [phone]. LOA 07/28/23-10/20/23.

## 2023-07-29 NOTE — Hospital Course (Addendum)
 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  Clinical stage III.  He underwent EUS on 04/04/2023 by Dr. Elnoria Burch and was felt to be at least T3, N1/2, MX.  He underwent radiation and chemotherapy in December with last dose being 05/30/2023.  Restaging PET CT scan was obtained on 06/30/2023, which showed response with substantial improvement in hypermetabolic activity within the distal esophagus consistent with a positive response to therapy.  He followed up with Dr. Cliffton Burch on 07/11/2023 at which time the patient was able to eat without complications and was able to put on some weight.  He was offered a Robotic Assisted Ivor Lewis Esophagectomy.  The risks and benefits of the procedure were explained to the patient and he was agreeable to proceed.  Hospital Course:  Seth Burch presented to [facility] on 07/28/2023.  He was taken to the operating room and underwent Robotic Assisted Ivor Lewis Esophagectomy and Placement of Jejunostomy tube. (Full procedure posted below).  He tolerated the procedure without difficulty, was extubated, and taken the PACU in stable condition.  The patient was started on tube feedings at 10 ml per hour the evening of surgery.  These were advanced slowly as he tolerated.  He was tachycardic and hypertensive post operatively, that was likely exacerbated by pain.  He was treated with IV hydralazine and low dose lopressor solution was also added to help with tachycardia.   His chest x-ray showed a trace right apical pneumothorax.  CT output was serous.  NG tube drainage was also reasonable and will be removed prior to his swallow study.  Pain control was difficult and IV Robaxin was added.  He has known BPH and developed urinary retention post operatively.  He required in and out cath  with removal of 700 cc of urine.  Has been voiding without difficulty since that time.  He developed low grade fever felt likely to be stress response from surgery.  This was monitored very closely.  He continued to have issues with pain control.  IV pain medication was discontinued and he was started on a PCA pump for better control.  This caused the patient to be very somnolent and was discontinued.  He developed worsening AKI on CKD with creatinine level peaking at 2.49.  This improved with albumin and IV fluid administration.  He underwent placement of foley catheter for accurate urinary output measurements.  He was also treated with albumin to help with mild dehydration.  It was felt best the patient be transferred to the ICU for closer monitoring and critical care consult was obtained.  His tube feeds were placed to goal on 3/14.  He has tolerated this without any nausea or vomiting.  He developed respiratory distress. He was given Duo nebs but did not help. He became agitated. He was given Precedex and Dexamethasone and put on bi pap. Ultimately, he was re intubated the evening of 03/15. He was put on Vancomycin and Zosyn to cover PNA. He underwent a bronchoscopy 03/15 and thick mucous in trachea was suctioned. Per pulmonary, suspicion is aspiration and mucus plugging with recurrent false vocal cord irritation leading to swelling, stridor, respiratory  failure.  He was able to be re-extubated on 08/04/2023.  He required support with Levophed which was weaned off as hemodynamics allowed.  He developed drainage around  J Tube and topical Gerhardt's butt cream was ordered and dressing changes as needed.  He underwent Esophogram on 08/05/2023 which showed no evidence of anastomotic leak.  However there was brisk aspiration of contrast media into Left mainstem bronchus.  Speech and Language pathology consultation was obtained.  They evaluated patient and performed MBS which showed patient's pharyngeal edema is  affecting his swallowing.  He was placed on Dysphagia 2 diet and nectar thickened liquids per recommendations.  He was felt to be stable for transfer to the progressive care unit on 08/07/2023.  The patient developed uncontrollable diarrhea initially but this is soon slow but steady improvement.[name]  He was started on Banatrol to help with this.  He developed persistent bilious drainage from his J tube with some leakage of tube feedings when running.  Diligent wound care was performed.  Tube feeding rate was decreased.  He was also transitioned to a different formulation to see if this helps with his diarrhea. He developed a fever without an obvious source.  This was closely monitored.  Over the next 2 days he did defervesce.  Additionally leukocytosis resolved.  He was transitioned back on oral medications as tolerated.  The patient did not wish to go to CIR at time of discharge.  Home health orders were placed and arrangements will be made prior to discharge.  He developed urinary retention and flomax was started.  This helped and patient was able to void with minimal difficulty.  He developed fever and leukcytosis.  He was started on Meropenem and Diflucan.  Repeat Esophogram was obtained and continued to show no evidence of anastomotic leak.  Chest x-ray was obtained and showed evidence of right pleural effusion.  Urinalysis was obtained and was negative for UTI.  He was restarted on a dysphagia 3 diet.  His antibiotics were discontinued by Dr. Cliffton Burch on 3/27.  The patient's oral intake improved.  Calorie count was initiated and revealed ***.  He will require tube feedings at discharge, *** at a rate of *** ml/hr.  His JP drain output remained low and was removed prior to discharge.  His surgical incisions are healing without evidence of infection.  He is medically stable for discharge home today.

## 2023-07-29 NOTE — TOC CM/SW Note (Signed)
 Transition of Care [facility]) - Inpatient Brief Assessment   Patient Details  Name: Seth Burch MRN: [id number] Date of Birth: Sep 13, 1955  Transition of Care [facility]) CM/SW Contact:    Kermit Balo, RN Phone Number: 07/29/2023, 3:55 PM   Clinical Narrative:  Pt is s/p XI ROBOTIC ASSISTED IVOR LEWIS ESOPHAGECTOMY. Pt with chest tube and NGT.  Transition of Care Asessment: Insurance and Status: Insurance coverage has been reviewed Patient has primary care physician: Yes Home environment has been reviewed: home with spouse   Prior/Current Home Services: No current home services Social Drivers of Health Review: SDOH reviewed no interventions necessary Readmission risk has been reviewed: Yes Transition of care needs: no transition of care needs at this time

## 2023-07-30 ENCOUNTER — Inpatient Hospital Stay (HOSPITAL_COMMUNITY)

## 2023-07-30 DIAGNOSIS — E440 Moderate protein-calorie malnutrition: Secondary | ICD-10-CM | POA: Insufficient documentation

## 2023-07-30 LAB — CBC
HCT: 35 % — ABNORMAL LOW (ref 39.0–52.0)
Hemoglobin: 11.6 g/dL — ABNORMAL LOW (ref 13.0–17.0)
MCH: 33.2 pg (ref 26.0–34.0)
MCHC: 33.1 g/dL (ref 30.0–36.0)
MCV: 100.3 fL — ABNORMAL HIGH (ref 80.0–100.0)
Platelets: 261 10*3/uL (ref 150–400)
RBC: 3.49 MIL/uL — ABNORMAL LOW (ref 4.22–5.81)
RDW: 16 % — ABNORMAL HIGH (ref 11.5–15.5)
WBC: 10.4 10*3/uL (ref 4.0–10.5)
nRBC: 0 % (ref 0.0–0.2)

## 2023-07-30 LAB — BASIC METABOLIC PANEL
Anion gap: 7 (ref 5–15)
BUN: 19 mg/dL (ref 8–23)
CO2: 22 mmol/L (ref 22–32)
Calcium: 8 mg/dL — ABNORMAL LOW (ref 8.9–10.3)
Chloride: 105 mmol/L (ref 98–111)
Creatinine, Ser: 1.88 mg/dL — ABNORMAL HIGH (ref 0.61–1.24)
GFR, Estimated: 39 mL/min — ABNORMAL LOW (ref >60)
Glucose, Bld: 255 mg/dL — ABNORMAL HIGH (ref 70–99)
Potassium: 3.9 mmol/L (ref 3.5–5.1)
Sodium: 134 mmol/L — ABNORMAL LOW (ref 135–145)

## 2023-07-30 LAB — GLUCOSE, CAPILLARY
Glucose-Capillary: 255 mg/dL — ABNORMAL HIGH (ref 70–99)
Glucose-Capillary: 229 mg/dL — ABNORMAL HIGH (ref 70–99)
Glucose-Capillary: 237 mg/dL — ABNORMAL HIGH (ref 70–99)
Glucose-Capillary: 194 mg/dL — ABNORMAL HIGH (ref 70–99)
Glucose-Capillary: 172 mg/dL — ABNORMAL HIGH (ref 70–99)
Glucose-Capillary: 182 mg/dL — ABNORMAL HIGH (ref 70–99)

## 2023-07-30 LAB — SURGICAL PATHOLOGY

## 2023-07-30 MED ORDER — SODIUM CHLORIDE 0.9% FLUSH: 10.0000 mL | INTRAVENOUS | Status: DC | PRN

## 2023-07-30 MED ORDER — INSULIN ASPART 100 UNIT/ML IJ SOLN
4.0000 [IU] | INTRAMUSCULAR | Status: DC
  Administered 2023-07-30 – 2023-07-31 (×7): 4 [IU] via SUBCUTANEOUS

## 2023-07-30 MED ORDER — OSMOLITE 1.5 CAL PO LIQD: 1000.0000 mL | ORAL | Status: DC

## 2023-07-30 MED ORDER — METOPROLOL TARTRATE 25 MG/10 ML ORAL SUSPENSION
12.5000 mg | Freq: Two times a day (BID) | ORAL | Status: DC
  Administered 2023-07-30 – 2023-08-07 (×16): 12.5 mg
  Filled 2023-07-30: qty 10
  Filled 2023-07-30: qty 5
  Filled 2023-07-30 (×4): qty 10
  Filled 2023-07-30: qty 5
  Filled 2023-07-30: qty 10
  Filled 2023-07-30 (×2): qty 5
  Filled 2023-07-30: qty 10
  Filled 2023-07-30: qty 5
  Filled 2023-07-30 (×3): qty 10
  Filled 2023-07-30: qty 5
  Filled 2023-07-30 (×2): qty 10

## 2023-07-30 MED ORDER — PROPOFOL 500 MG/50ML IV EMUL
INTRAVENOUS | Status: AC
  Filled 2023-07-30: qty 100

## 2023-07-30 MED ORDER — PROSOURCE TF20 ENFIT COMPATIBL EN LIQD
60.0000 mL | Freq: Every day | ENTERAL | Status: DC
  Administered 2023-07-30 – 2023-08-08 (×10): 60 mL
  Filled 2023-07-30 (×10): qty 60

## 2023-07-30 MED ORDER — HYDROMORPHONE 1 MG/ML IV SOLN
INTRAVENOUS | Status: DC
  Administered 2023-07-30 (×2): 1.2 mg via INTRAVENOUS
  Administered 2023-07-30: 30 mg via INTRAVENOUS
  Administered 2023-07-30: 2.1 mg via INTRAVENOUS
  Administered 2023-07-31: 3 mg via INTRAVENOUS
  Administered 2023-07-31: 2.1 mg via INTRAVENOUS
  Filled 2023-07-30: qty 30

## 2023-07-30 MED ORDER — ACETAMINOPHEN 160 MG/5ML PO SOLN
650.0000 mg | Freq: Four times a day (QID) | ORAL | Status: DC | PRN
  Administered 2023-07-31 – 2023-08-04 (×6): 650 mg
  Filled 2023-07-30 (×6): qty 20.3

## 2023-07-30 MED ORDER — DIPHENHYDRAMINE HCL 50 MG/ML IJ SOLN: 12.5000 mg | Freq: Four times a day (QID) | INTRAMUSCULAR | Status: DC | PRN

## 2023-07-30 MED ORDER — SODIUM CHLORIDE 0.9% FLUSH: 9.0000 mL | INTRAVENOUS | Status: DC | PRN

## 2023-07-30 MED ORDER — NALOXONE HCL 0.4 MG/ML IJ SOLN: 0.4000 mg | INTRAMUSCULAR | Status: DC | PRN

## 2023-07-30 MED ORDER — DIPHENHYDRAMINE HCL 12.5 MG/5ML PO ELIX: 12.5000 mg | ORAL_SOLUTION | Freq: Four times a day (QID) | ORAL | Status: DC | PRN

## 2023-07-30 MED ORDER — VASOPRESSIN 20 UNIT/ML IV SOLN
INTRAVENOUS | Status: AC
  Filled 2023-07-30: qty 1

## 2023-07-30 MED ORDER — SODIUM CHLORIDE 0.9% FLUSH
10.0000 mL | Freq: Two times a day (BID) | INTRAVENOUS | Status: DC
Start: 2023-07-30 — End: 2023-08-05
  Administered 2023-07-30: 10 mL
  Administered 2023-07-30: 20 mL
  Administered 2023-07-31 – 2023-08-04 (×10): 10 mL

## 2023-07-30 MED ORDER — CHLORHEXIDINE GLUCONATE CLOTH 2 % EX PADS
6.0000 | MEDICATED_PAD | Freq: Every day | CUTANEOUS | Status: DC
  Administered 2023-07-30 – 2023-08-19 (×20): 6 via TOPICAL

## 2023-07-30 NOTE — Progress Notes (Addendum)
 [address]       [name] [address]             [phone]      2 Days Post-Op Procedure(s) (LRB): XI ROBOTIC ASSISTED IVOR LEWIS ESOPHAGECTOMY (N/A) EGD (ESOPHAGOGASTRODUODENOSCOPY) (N/A)  Subjective:  Patient not doing well.  Had a very rough night.  He is not having good pain control.  Denies N/V.  Objective: Vital signs in last 24 hours: Temp:  [98.3 F (36.8 C)-100.2 F (37.9 C)] 98.5 F (36.9 C) (03/12 0721) Pulse Rate:  [82-124] 109 (03/12 0721) Cardiac Rhythm: Sinus tachycardia (03/11 1900) Resp:  [20-21] 20 (03/12 0721) BP: (124-164)/(62-81) 124/62 (03/12 0721) SpO2:  [93 %-96 %] 93 % (03/12 0721) Weight:  [92.6 kg] 92.6 kg (03/12 0648)  Intake/Output from previous day: 03/11 0701 - 03/12 0700 In: 0  Out: 2250 [Urine:1650; Emesis/NG output:200; Drains:150; Chest Tube:250]  General appearance: alert, cooperative, and no distress Heart: regular rate and rhythm and tachy Lungs: coarse, clears with ocugh Abdomen: soft, non-tender; bowel sounds normal; no masses,  no organomegaly Extremities: edema trace Wound: clean and dry  Lab Results: Recent Labs    07/28/23 1653 07/29/23 0218  WBC  --  6.1  HGB 9.2* 10.9*  HCT 27.0* 32.3*  PLT  --  211   BMET:  Recent Labs    07/28/23 1653 07/29/23 0218  NA 142 138  K 3.9 4.2  CL  --  109  CO2  --  22  GLUCOSE  --  190*  BUN  --  19  CREATININE  --  1.50*  CALCIUM  --  8.0*    PT/INR: No results for input(s): "LABPROT", "INR" in the last 72 hours. ABG    Component Value Date/Time   PHART 7.4 07/29/2023 0514   HCO3 22.3 07/29/2023 0514   TCO2 23 07/28/2023 1653   ACIDBASEDEF 2.0 07/29/2023 0514   O2SAT 95.2 07/29/2023 0514   CBG (last 3)  Recent Labs    07/29/23 1624 07/29/23 1955 07/30/23 0519  GLUCAP 178* 194* 255*    Assessment/Plan: S/P Procedure(s) (LRB): XI ROBOTIC ASSISTED IVOR LEWIS ESOPHAGECTOMY (N/A) EGD (ESOPHAGOGASTRODUODENOSCOPY) (N/A)  CV- Sinus  Tachycardia, HTN, chronic- continue Hydralazine, will add Lorpessor 12.5 mg BID per tube Pulm- no acute issues, CXR with improvement of apical pneumothorax, no evidence of significant pleural effusion, + atelectasis... CT output 250, JP drain 150 cc Renal- stop IV fluids, labs pending.. Pain control- patient is not having good pain control, will stop IV Morphine, continue Oxycodone, add Dilaudid PCA, Tylenol prn.. if creatinine normalizes could try toradol GI- tube feedings are to be titrated slowly.. will increase to 40 today.. continue supplements per nutrition.. NG output 200, no N/V.... will remain NPO... swallow study for Monday GU- urinary retention resolved after I/O cath ID- low grade fever, likely SIRS.[name] will monitor closely.. very low threshold to start ABX should this persist or leukocytosis develop DM- sugars are stable.. elevated at times as tube feedings increase may need to add long acting insulin to help with coverage monitor for now Malnutrition, chronic- Albumin was 4 on admission...significant weight loss prior to surgery in setting of esophageal cancer Deconditioning- patient has not gotten out of bed.. pain is his biggest issue, needs to get into chair and ambulate.[name] get PT/OT consult Lovenox- for DVT prophylaxis   LOS: 2 days    [name], PA-C 07/30/2023    AM labs completed and reviewed:   His  creatinine level has increased to 1.88... He has known Stage 3 CKD at baseline with likely AKI due to dehydration being NPO post surgery.  He is currently receiving tube feeds and free water flushes.  Hemoglobin level is good at 11.6, no evidence of anemia or acute bleeding. Previous reading was likely dilutional with IV fluids running..  Repeat labs daily for now.. will monitor closely.  [name], PA-C 12:58 PM 07/30/23    Agree with above Pain control Watching creat  Herschel Fleagle O Yuette Putnam

## 2023-07-30 NOTE — Progress Notes (Signed)
 Physical Therapy Evaluation Patient Details Name: Seth Burch MRN: [id number] DOB: [birth year] Today's Date: 07/30/2023  History of Present Illness  68 y.o. 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 Pt with trace R ptx, + atelectasis. PMHx: HTN, DM  Clinical Impression  Pt in bed upon arrival and agreeable to PT eval. PTA, pt was independent for mobility with no AD. Pt was limited in today's session by pain and fatigue. Pt required ModA to roll and ModA to move from sidelying to sit on the EOB. Increased time and effort needed. Pt was able to stand with heavy ModA and RW with assistance to boost up, stabilize RW, and for balance. Upon standing, pt had a posterior lean corrected with cueing. Pt was able to march in place but became fatigued and needed to return to sitting. Pt required ModA to return to supine for LE management. Pt currently with functional limitations due to the deficits listed below (see PT Problem List). Pt would benefit from acute skilled PT to address functional impairments. Recommending post-acute rehab >3hrs to work towards independence with mobility. Pt will have 24/7 family support upon d/c from the hospital. Anticipated pt will progress well due to high levels of motivation, strong family support, and prior level of mobility. Acute PT to follow.         If plan is discharge home, recommend the following: A lot of help with walking and/or transfers;A lot of help with bathing/dressing/bathroom;Assistance with cooking/housework;Assist for transportation;Help with stairs or ramp for entrance   Can travel by private vehicle    No    Equipment Recommendations Rolling walker (2 wheels);BSC/3in1;Wheelchair (measurements PT);Wheelchair cushion (measurements PT)  Recommendations for Other Services  Rehab consult    Functional Status Assessment Patient has had a recent decline in their functional status and demonstrates  the ability to make significant improvements in function in a reasonable and predictable amount of time.     Precautions / Restrictions Precautions Precautions: Fall Precaution/Restrictions Comments: chest tube, JP drain, NG tube, PCA pump Restrictions Weight Bearing Restrictions Per Provider Order: No      Mobility  Bed Mobility Overal bed mobility: Needs Assistance Bed Mobility: Rolling, Sidelying to Sit, Sit to Supine Rolling: Mod assist Sidelying to sit: Mod assist   Sit to supine: Mod assist   General bed mobility comments: ModA to roll and move from sidelying to sit for trunk elevation. Increased time and effort. ModA to return to supine for LE management    Transfers Overall transfer level: Needs assistance Equipment used: Rolling walker (2 wheels) Transfers: Sit to/from Stand Sit to Stand: Mod assist      General transfer comment: heavy ModA to boost up and steady with assistance needed to stabilize RW. Cues for hand placement. Initial posterior lean able to be correct with cues. Able to march in place with increased fatigue    Ambulation/Gait  General Gait Details: unable to progress 2/2 fatigue and pain     Balance Overall balance assessment: Needs assistance, Mild deficits observed, not formally tested Sitting-balance support: No upper extremity supported, Feet supported Sitting balance-Leahy Scale: Fair   Postural control: Posterior lean Standing balance support: Bilateral upper extremity supported, During functional activity, Reliant on assistive device for balance Standing balance-Leahy Scale: Poor Standing balance comment: reliant on RW and external support       Pertinent Vitals/Pain Pain Assessment Pain Assessment: Faces Faces Pain Scale: Hurts even more Pain Location: R abdomen Pain Descriptors /  Indicators: Aching, Discomfort Pain Intervention(s): Limited activity within patient's tolerance, Monitored during session, Repositioned    Home  Living Family/patient expects to be discharged to:: Private residence Living Arrangements: Spouse/significant other Available Help at Discharge: Family;Available 24 hours/day Type of Home: House Home Access: Stairs to enter Entrance Stairs-Rails: Right;Left;Can reach both Entrance Stairs-Number of Steps: 4   Home Layout: One level Home Equipment: Shower seat      Prior Function Prior Level of Function : Independent/Modified Independent;Driving (on disability)        Extremity/Trunk Assessment   Upper Extremity Assessment Upper Extremity Assessment: Defer to OT evaluation    Lower Extremity Assessment Lower Extremity Assessment: Generalized weakness    Cervical / Trunk Assessment Cervical / Trunk Assessment: Normal  Communication   Communication Communication: No apparent difficulties    Cognition Arousal: Lethargic Behavior During Therapy: WFL for tasks assessed/performed   PT - Cognitive impairments: No apparent impairments     Following commands: Intact       Cueing Cueing Techniques: Verbal cues     General Comments General comments (skin integrity, edema, etc.): HR 120-130 BPM with activity    Exercises General Exercises - Lower Extremity Hip Flexion/Marching: AROM, Both, Standing (3 reps)   Assessment/Plan    PT Assessment Patient needs continued PT services  PT Problem List Decreased strength;Decreased activity tolerance;Decreased balance;Decreased mobility;Decreased knowledge of use of DME       PT Treatment Interventions DME instruction;Gait training;Stair training;Functional mobility training;Therapeutic activities;Therapeutic exercise;Balance training;Neuromuscular re-education;Patient/family education    PT Goals (Current goals can be found in the Care Plan section)  Acute Rehab PT Goals Patient Stated Goal: to get better PT Goal Formulation: With patient/family Time For Goal Achievement: 08/13/23 Potential to Achieve Goals: Good     Frequency Min 3X/week        AM-PAC PT "6 Clicks" Mobility  Outcome Measure Help needed turning from your back to your side while in a flat bed without using bedrails?: A Lot Help needed moving from lying on your back to sitting on the side of a flat bed without using bedrails?: A Lot Help needed moving to and from a bed to a chair (including a wheelchair)?: A Lot Help needed standing up from a chair using your arms (e.g., wheelchair or bedside chair)?: A Lot Help needed to walk in hospital room?: Total Help needed climbing 3-5 steps with a railing? : Total 6 Click Score: 10    End of Session   Activity Tolerance: Patient limited by fatigue;Patient limited by pain Patient left: in bed;with call bell/phone within reach;with family/visitor present Nurse Communication: Mobility status;Other (comment) (alert on PCA pump) PT Visit Diagnosis: Unsteadiness on feet (R26.81);Muscle weakness (generalized) (M62.81);Other abnormalities of gait and mobility (R26.89)    Time: 1610-9604 PT Time Calculation (min) (ACUTE ONLY): 23 min   Charges:   PT Evaluation $PT Eval Moderate Complexity: 1 Mod   PT General Charges $$ ACUTE PT VISIT: 1 Visit       Hilton Cork, PT, DPT Secure Chat Preferred  Rehab Office [phone]   Arturo Morton Brion Aliment 07/30/2023, 1:19 PM

## 2023-07-30 NOTE — Inpatient Diabetes Management (Signed)
 Inpatient Diabetes Program Recommendations  AACE/ADA: New Consensus Statement on Inpatient Glycemic Control  Target Ranges:  Prepandial:   less than 140 mg/dL      Peak postprandial:   less than 180 mg/dL (1-2 hours)      Critically ill patients:  140 - 180 mg/dL    Latest Reference Range & Units 07/29/23 08:01 07/29/23 12:02 07/29/23 16:24 07/29/23 19:55 07/30/23 05:19 07/30/23 08:09  Glucose-Capillary 70 - 99 mg/dL 604 (H) 540 (H) 981 (H) 194 (H) 255 (H) 229 (H)   Review of Glycemic Control  Diabetes history: DM2 Outpatient Diabetes medications: Jardiance 10 mg daily, Lantus 26 units daily, Metformin 1000 mg daily Current orders for Inpatient glycemic control: Novolog 0-24 units Q24H; Osmolite @ 40 ml/hr  Inpatient Diabetes Program Recommendations:    Insulin:  Please consider ordering Novolog 4 units Q4H for tube feeding coverage. If tube feeding is stopped or held then Novolog tube feeding coverage should also be stopped or held.  Thanks, Orlando Penner, RN, MSN, CDCES Diabetes Coordinator Inpatient Diabetes Program [phone] (Team Pager from 8am to 5pm)

## 2023-07-30 NOTE — TOC Initial Note (Signed)
 Transition of Care [facility]) - Initial/Assessment Note    Patient Details  Name: Seth Burch MRN: [id number] Date of Birth: Mar 31, 1956  [facility]) CM/SW Contact:    Marliss Coots, LCSW Phone Number: 07/30/2023, 4:48 PM  Clinical Narrative:                  4:48 PM Per chart review, patient is a potential candidate for CIR. CIR admissions has placed an order for rehab consult for full assessment.   Expected Discharge Plan: IP Rehab Facility Barriers to Discharge: Continued Medical Work up   Patient Goals and CMS Choice Patient states their goals for this hospitalization and ongoing recovery are:: CIR          Expected Discharge Plan and Services     Post Acute Care Choice: IP Rehab                                        Prior Living Arrangements/Services   Lives with:: Spouse Patient language and need for interpreter reviewed:: Yes        Need for Family Participation in Patient Care: Yes (Comment) Care giver support system in place?: Yes (comment)   Criminal Activity/Legal Involvement Pertinent to Current Situation/Hospitalization: No - Comment as needed  Activities of Daily Living   ADL Screening (condition at time of admission) Independently performs ADLs?: Yes (appropriate for developmental age) Is the patient deaf or have difficulty hearing?: No Does the patient have difficulty seeing, even when wearing glasses/contacts?: Yes Does the patient have difficulty concentrating, remembering, or making decisions?: No  Permission Sought/Granted Permission sought to share information with : Family Supports Permission granted to share information with : No (Contact information on chart)  Share Information with NAME: Mariano Doshi  Permission granted to share info w AGENCY: CIR  Permission granted to share info w Relationship: Spouse  Permission granted to share info w Contact Information: [phone]  Emotional Assessment        Orientation: : Oriented to Self, Oriented to Place, Oriented to  Time, Oriented to Situation Alcohol / Substance Use: Not Applicable Psych Involvement: No (comment)  Admission diagnosis:  Esophageal cancer (HCC) [C15.9] H/O esophagectomy [O96.295, Z90.49] Patient Active Problem List   Diagnosis Date Noted   Malnutrition of moderate degree 07/30/2023   Esophageal cancer (HCC) 07/28/2023   H/O esophagectomy 07/28/2023   Chemotherapy-induced thrombocytopenia 05/22/2023   Port-A-Cath in place 05/02/2023   Leukopenia due to antineoplastic chemotherapy (HCC) 05/02/2023   CKD (chronic kidney disease) stage 3, GFR 30-59 ml/min (HCC) 04/24/2023   Dysphagia 03/27/2023   Unintentional weight loss 03/27/2023   Primary adenocarcinoma of distal third of esophagus (HCC) 03/11/2023   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   PCP:  Shelle Iron, MD Pharmacy:   [facility] [address] [place] - [address] [place] [address] Phone: [phone] Fax: [phone]     Social Drivers of Health (SDOH) Social History: SDOH Screenings   Food Insecurity: No Food Insecurity (07/29/2023)  Housing: Low Risk  (07/29/2023)  Transportation Needs: No Transportation Needs (07/29/2023)  Utilities: Not At Risk (07/29/2023)  Depression (PHQ2-9): Low Risk  (06/12/2023)  Social Connections: Moderately Integrated (07/29/2023)  Tobacco Use: Low Risk  (07/28/2023)   SDOH Interventions:     Readmission Risk Interventions     No data to display

## 2023-07-30 NOTE — Progress Notes (Signed)

## 2023-07-31 ENCOUNTER — Inpatient Hospital Stay (HOSPITAL_COMMUNITY)

## 2023-07-31 DIAGNOSIS — C155 Malignant neoplasm of lower third of esophagus: Secondary | ICD-10-CM | POA: Diagnosis not present

## 2023-07-31 DIAGNOSIS — N179 Acute kidney failure, unspecified: Secondary | ICD-10-CM

## 2023-07-31 DIAGNOSIS — Z794 Long term (current) use of insulin: Secondary | ICD-10-CM | POA: Diagnosis not present

## 2023-07-31 DIAGNOSIS — E119 Type 2 diabetes mellitus without complications: Secondary | ICD-10-CM | POA: Diagnosis not present

## 2023-07-31 DIAGNOSIS — R509 Fever, unspecified: Secondary | ICD-10-CM

## 2023-07-31 DIAGNOSIS — I10 Essential (primary) hypertension: Secondary | ICD-10-CM

## 2023-07-31 DIAGNOSIS — E785 Hyperlipidemia, unspecified: Secondary | ICD-10-CM

## 2023-07-31 LAB — BASIC METABOLIC PANEL
Anion gap: 7 (ref 5–15)
BUN: 32 mg/dL — ABNORMAL HIGH (ref 8–23)
CO2: 22 mmol/L (ref 22–32)
Calcium: 7.9 mg/dL — ABNORMAL LOW (ref 8.9–10.3)
Chloride: 111 mmol/L (ref 98–111)
Creatinine, Ser: 2.49 mg/dL — ABNORMAL HIGH (ref 0.61–1.24)
GFR, Estimated: 28 mL/min — ABNORMAL LOW (ref >60)
Glucose, Bld: 218 mg/dL — ABNORMAL HIGH (ref 70–99)
Potassium: 4 mmol/L (ref 3.5–5.1)
Sodium: 140 mmol/L (ref 135–145)

## 2023-07-31 LAB — CBC
HCT: 30 % — ABNORMAL LOW (ref 39.0–52.0)
Hemoglobin: 9.7 g/dL — ABNORMAL LOW (ref 13.0–17.0)
MCH: 32.7 pg (ref 26.0–34.0)
MCHC: 32.3 g/dL (ref 30.0–36.0)
MCV: 101 fL — ABNORMAL HIGH (ref 80.0–100.0)
Platelets: 259 10*3/uL (ref 150–400)
RBC: 2.97 MIL/uL — ABNORMAL LOW (ref 4.22–5.81)
RDW: 15.9 % — ABNORMAL HIGH (ref 11.5–15.5)
WBC: 10.4 10*3/uL (ref 4.0–10.5)
nRBC: 0 % (ref 0.0–0.2)

## 2023-07-31 LAB — GLUCOSE, CAPILLARY
Glucose-Capillary: 189 mg/dL — ABNORMAL HIGH (ref 70–99)
Glucose-Capillary: 206 mg/dL — ABNORMAL HIGH (ref 70–99)
Glucose-Capillary: 198 mg/dL — ABNORMAL HIGH (ref 70–99)
Glucose-Capillary: 231 mg/dL — ABNORMAL HIGH (ref 70–99)

## 2023-07-31 MED ORDER — FREE WATER
150.0000 mL | Status: DC
  Administered 2023-07-31 (×2): 150 mL

## 2023-07-31 MED ORDER — INSULIN ASPART 100 UNIT/ML IJ SOLN
0.0000 [IU] | INTRAMUSCULAR | Status: DC
  Administered 2023-07-31 – 2023-08-01 (×4): 8 [IU] via SUBCUTANEOUS
  Administered 2023-08-01 (×2): 2 [IU] via SUBCUTANEOUS
  Administered 2023-08-01: 8 [IU] via SUBCUTANEOUS

## 2023-07-31 MED ORDER — INSULIN ASPART 100 UNIT/ML IJ SOLN
5.0000 [IU] | INTRAMUSCULAR | Status: DC
  Administered 2023-07-31 – 2023-08-02 (×11): 5 [IU] via SUBCUTANEOUS

## 2023-07-31 MED ORDER — OXYCODONE HCL 5 MG/5ML PO SOLN
5.0000 mg | ORAL | Status: DC | PRN
  Administered 2023-07-31 – 2023-08-07 (×9): 5 mg
  Filled 2023-07-31 (×11): qty 5

## 2023-07-31 MED ORDER — INSULIN GLARGINE 100 UNIT/ML ~~LOC~~ SOLN
5.0000 [IU] | Freq: Two times a day (BID) | SUBCUTANEOUS | Status: DC
Start: 2023-07-31 — End: 2023-08-02
  Administered 2023-07-31 – 2023-08-01 (×4): 5 [IU] via SUBCUTANEOUS
  Filled 2023-07-31 (×6): qty 0.05

## 2023-07-31 MED ORDER — MORPHINE SULFATE (PF) 2 MG/ML IV SOLN
2.0000 mg | INTRAVENOUS | Status: DC | PRN
  Administered 2023-08-02 – 2023-08-06 (×3): 2 mg via INTRAVENOUS
  Filled 2023-07-31 (×4): qty 1

## 2023-07-31 MED ORDER — FREE WATER
200.0000 mL | Status: DC
  Administered 2023-07-31 – 2023-08-07 (×38): 200 mL

## 2023-07-31 MED ORDER — OSMOLITE 1.5 CAL PO LIQD
1000.0000 mL | ORAL | Status: DC
  Administered 2023-07-31: 1000 mL

## 2023-07-31 MED ORDER — INSULIN ASPART 100 UNIT/ML IJ SOLN: 7.0000 [IU] | INTRAMUSCULAR | Status: DC

## 2023-07-31 MED ORDER — ALBUMIN HUMAN 5 % IV SOLN
12.5000 g | Freq: Once | INTRAVENOUS | Status: AC
  Administered 2023-07-31: 12.5 g via INTRAVENOUS
  Filled 2023-07-31: qty 250

## 2023-07-31 MED ORDER — ATORVASTATIN CALCIUM 10 MG PO TABS: 20.0000 mg | ORAL_TABLET | Freq: Every day | ORAL | Status: DC

## 2023-07-31 NOTE — Progress Notes (Signed)
 OT Cancellation Note  Patient Details Name: Seth Burch MRN: [id number] DOB: Jun 12, 1955   Cancelled Treatment:    Reason Eval/Treat Not Completed: Other (comment) (Attempted to see pt for acute OT eval. However, pt being transferred to [facility] upon arrival. OT to reattempt to see pt for OT eval at a later time as appropriate/available.)  Christinia Lambeth "Ronaldo Miyamoto" M., OTR/L, MA Acute Rehab [phone]  Lendon Colonel 07/31/2023, 10:48 AM

## 2023-07-31 NOTE — Inpatient Diabetes Management (Signed)
 Inpatient Diabetes Program Recommendations  AACE/ADA: New Consensus Statement on Inpatient Glycemic Control  Target Ranges:  Prepandial:   less than 140 mg/dL      Peak postprandial:   less than 180 mg/dL (1-2 hours)      Critically ill patients:  140 - 180 mg/dL    Latest Reference Range & Units 07/30/23 08:09 07/30/23 12:01 07/30/23 16:03 07/30/23 20:13 07/30/23 23:18 07/31/23 04:53 07/31/23 08:28  Glucose-Capillary 70 - 99 mg/dL 811 (H) 914 (H) 782 (H) 172 (H) 182 (H) 189 (H) 206 (H)   Review of Glycemic Control  Diabetes history: DM2 Outpatient Diabetes medications: Jardiance 10 mg daily, Lantus 26 units daily, Metformin 1000 mg daily Current orders for Inpatient glycemic control: Novolog 0-24 units Q24H, Novlog 4 units Q4H; Osmolite @ 55 ml/hr   Inpatient Diabetes Program Recommendations:     Insulin:  Please consider increasing tube feeding coverage to Novolog 7 units Q4H. If tube feeding is stopped or held then Novolog tube feeding coverage should also be stopped or held.  Thanks, Orlando Penner, RN, MSN, CDCES Diabetes Coordinator Inpatient Diabetes Program [phone] (Team Pager from 8am to 5pm)

## 2023-07-31 NOTE — Progress Notes (Signed)
 PT Cancellation Note  Patient Details Name: Seth Burch MRN: [id number] DOB: 1955/11/29   Cancelled Treatment:    Reason Eval/Treat Not Completed: Pt is being transferred to [facility]. Acute PT to re-attempt as schedule allows. Acute PT to follow.  Hilton Cork, PT, DPT Secure Chat Preferred  Rehab Office [phone]  Arturo Morton Brion Aliment 07/31/2023, 10:36 AM

## 2023-07-31 NOTE — Progress Notes (Addendum)
 Stopped PCA pump per order. Wasted 17 ml or 17mg  of iv Dilaudid with second Charity fundraiser. Hendricks Milo RN.

## 2023-07-31 NOTE — Evaluation (Signed)
 Occupational Therapy Evaluation Patient Details Name: Seth Burch MRN: [id number] DOB: [birth year] Today's Date: 07/31/2023   History of Present Illness   68 y.o. male presents to [facility] 07/28/23 for esophagectomy and EGD to address distal esophageal adenocarcinoma. Pt with trace R ptx, + atelectasis. Course complicated with AKI on CKD stage IIIa, uncontrolled pain, and increasing lethargy. Pt transferred to ICU on 3/13 for close monitoring. PMHx: HTN, DM     Clinical Impressions At baseline, pt is Independent with ADLs, IADLs, and functional mobility without an AD, and drives. Pt now presents with decreased activity tolerance, increased fatigue, lethargy, generalized B UE weakness, decreased B UE fine and gross motor coordination, decreased B UE proprioception, decreased balance, pain affecting functional level, and decreased safety and independence with functional tasks. Pt currently demonstrates ability to complete UB ADLs with Mod to Max assist, LB ADLs with Total assist +2, and functional step-pivot transfers with Mod assist +2. Pt participated well in session, is motivated to increase independence, and has good family support. Pt's O2 sat >/98%, HR in the 110s to 120s throughout session. Pt will benefit from acute skilled OT services to address deficits outlined below and to increase safety and independence with functional tasks. Post acute discharge, pt will benefit from intensive inpatient skilled rehab services > 3 hours per day to maximize rehab potential.      If plan is discharge home, recommend the following:   Two people to help with walking and/or transfers;Two people to help with bathing/dressing/bathroom;Assistance with cooking/housework;Assist for transportation;Help with stairs or ramp for entrance;Assistance with feeding (currently NPO)     Functional Status Assessment   Patient has had a recent decline in their functional status and demonstrates the ability to make  significant improvements in function in a reasonable and predictable amount of time.     Equipment Recommendations   BSC/3in1;Other (comment) (Other TBD based on pt progress)     Recommendations for Other Services   Rehab consult     Precautions/Restrictions   Precautions Precautions: Fall;Other (comment) Precaution/Restrictions Comments: chest tube, JP drain, NG tube, PCA pump Restrictions Weight Bearing Restrictions Per Provider Order: No     Mobility Bed Mobility Overal bed mobility: Needs Assistance Bed Mobility: Rolling, Sit to Sidelying Rolling: Mod assist       Sit to sidelying: Max assist, +2 for physical assistance, +2 for safety/equipment      Transfers Overall transfer level: Needs assistance Equipment used: 2 person hand held assist Transfers: Sit to/from Stand, Bed to chair/wheelchair/BSC Sit to Stand: Mod assist, +2 safety/equipment     Step pivot transfers: Mod assist, +2 safety/equipment     General transfer comment: Pt funcitonal level affected by fatigue and lethargy.      Balance Overall balance assessment: Needs assistance, Mild deficits observed, not formally tested Sitting-balance support: No upper extremity supported, Single extremity supported, Bilateral upper extremity supported, Feet supported Sitting balance-Leahy Scale: Fair Sitting balance - Comments: Largely Fair sitting balance with pt sitting up with unsupported back in recliner for bandage approx. 10 min while RN changed R flank bandanges and UB dressing. During that time pt required intermittent CGA to maintain sitting balance due to fatigue, lethargy, and increased pain with bandage change. Postural control: Right lateral lean, Posterior lean, Other (comment) (anterior lean; pt largely with posterior and Right lean but with intermittent anterior lean when sitting with back unsupported in recliner with pt requiring verbal cues and intermittent CGA to correct)  ADL either performed or assessed with clinical judgement   ADL Overall ADL's : Needs assistance/impaired Eating/Feeding: NPO   Grooming: Moderate assistance;Maximal assistance;Sitting   Upper Body Bathing: Maximal assistance;Bed level;Cueing for compensatory techniques   Lower Body Bathing: Total assistance;+2 for physical assistance;+2 for safety/equipment;Bed level   Upper Body Dressing : Moderate assistance;Maximal assistance;Sitting;Cueing for compensatory techniques   Lower Body Dressing: Total assistance;+2 for physical assistance;+2 for safety/equipment;Bed level   Toilet Transfer: Moderate assistance;+2 for safety/equipment;BSC/3in1 (HHA +2; step-pivot transfer) Toilet Transfer Details (indicate cue type and reason): simulated chair to bed Toileting- Clothing Manipulation and Hygiene: Total assistance;+2 for physical assistance;+2 for safety/equipment;Bed level       Functional mobility during ADLs:  (Deferred this session for pt/therapist safety due to fatigue and lethargy) General ADL Comments: Pt with decreased activity tolerance and lethargy affecting functional level. Pt keeping eyes closed most of session, including during tasks, unless prompted to open them.     Vision Baseline Vision/History: 1 Wears glasses (readers) Ability to See in Adequate Light: 0 Adequate Patient Visual Report: No change from baseline       Perception         Praxis         Pertinent Vitals/Pain Pain Assessment Pain Assessment: Faces Faces Pain Scale: Hurts even more Pain Location: nose at site of NG tube; R abdomen/flank Pain Descriptors / Indicators: Aching, Discomfort, Guarding Pain Intervention(s): Limited activity within patient's tolerance, Monitored during session, Repositioned, Other (comment) (RN changing R flank bandages at site of drain during session)     Extremity/Trunk Assessment Upper Extremity Assessment Upper Extremity Assessment:  Right hand dominant;Generalized weakness;LUE deficits/detail;RUE deficits/detail RUE Deficits / Details: generalized weakness; AROM shoulder flexion to appox. 85 degrees; decreased proprioception; decreased fine and gross motor coordination RUE Sensation: decreased proprioception RUE Coordination: decreased fine motor;decreased gross motor LUE Deficits / Details: generalized weakness; AROM shoulder flexion to appox. 90 degrees; decreased proprioception; decreased fine and gross motor coordination; edematous hand and forearm; missing distal phalanx of 5th digit, uncertain if congenital or due to prior injury- skin intact and does not affect function LUE Sensation: decreased proprioception LUE Coordination: decreased fine motor;decreased gross motor   Lower Extremity Assessment Lower Extremity Assessment: Defer to PT evaluation   Cervical / Trunk Assessment Cervical / Trunk Assessment: Normal   Communication Communication Communication: No apparent difficulties Factors Affecting Communication: Other (comment) (Pt lethargy and requiring mildly increased time for processing and responding)   Cognition Arousal: Lethargic Behavior During Therapy: WFL for tasks assessed/performed Cognition: No apparent impairments (Pt requiring mildly increased time for processing/responding to questions)             OT - Cognition Comments: Pt oriented x4 and pleasant throughout session with cognition WNL for tasks assessed. Pt lethargic but answering all questions appropriately and demonstrating overall good attention to task. Cognition not formally screened or assessed.                 Following commands: Intact       Cueing  General Comments   Cueing Techniques: Verbal cues (for technique/hand placement with novel tasks)  O2 sat >/98%, HR in the 110s to 120s throughout session. Pt's wife and daughter present throughout session. RN present and assisting through majority of session. OT  educated pt and family in positioning L UE in bed and recliner to address edematous L UE with all verbalizing understanding of training.   Exercises Exercises: General Upper Extremity General Exercises - Upper Extremity Elbow Flexion:  AROM, Left, 5 reps, Supine (address edematous hand and forearm) Elbow Extension: AROM, Left, 5 reps, Supine (address edematous hand and forearm) Wrist Flexion: AROM, Left, 5 reps, Supine (address edematous hand and forearm) Wrist Extension: AROM, Left, 5 reps, Supine (address edematous hand and forearm) Digit Composite Flexion: AROM, Left, 5 reps, Supine (address edematous hand and forearm) Composite Extension: AROM, Left, 5 reps, Supine (address edematous hand and forearm)   Shoulder Instructions      Home Living Family/patient expects to be discharged to:: Private residence Living Arrangements: Spouse/significant other Available Help at Discharge: Family;Available 24 hours/day Type of Home: House Home Access: Stairs to enter [employer] of Steps: 4 Entrance Stairs-Rails: Right;Left;Can reach both Home Layout: One level     Bathroom Shower/Tub: Producer, television/film/video: Standard     Home Equipment: Shower seat          Prior Functioning/Environment Prior Level of Function : Independent/Modified Independent;Driving (on disability)             Mobility Comments: Independent without an AD ADLs Comments: Independent with ADLs and IADLs; drives    OT Problem List: Decreased strength;Decreased activity tolerance;Impaired balance (sitting and/or standing);Decreased coordination;Decreased knowledge of use of DME or AE;Cardiopulmonary status limiting activity   OT Treatment/Interventions: Self-care/ADL training;Therapeutic exercise;Energy conservation;DME and/or AE instruction;Therapeutic activities;Patient/family education;Balance training      OT Goals(Current goals can be found in the care plan section)   Acute Rehab  OT Goals Patient Stated Goal: to return home OT Goal Formulation: With patient/family Time For Goal Achievement: 08/14/23 Potential to Achieve Goals: Good ADL Goals Pt Will Perform Grooming: with contact guard assist;sitting (sitting EOB for 5 or more minutes with Good balance) Pt Will Perform Lower Body Bathing: with min assist;sitting/lateral leans;sit to/from stand (with adpative equipment as needed) Pt Will Perform Upper Body Dressing: with contact guard assist;sitting Pt Will Perform Lower Body Dressing: with min assist;sitting/lateral leans;sit to/from stand (with adpative equipment as needed) Pt Will Transfer to Toilet: with contact guard assist;ambulating;bedside commode (with least restrictive AD) Pt Will Perform Toileting - Clothing Manipulation and hygiene: with min assist;sitting/lateral leans;sit to/from stand   OT Frequency:  Min 2X/week    Co-evaluation PT/OT/SLP Co-Evaluation/Treatment: Yes Reason for Co-Treatment: For patient/therapist safety;To address functional/ADL transfers   OT goals addressed during session: ADL's and self-care;Other (comment) (Positioning)      AM-PAC OT "6 Clicks" Daily Activity     Outcome Measure Help from another person eating meals?: Total (NPO) Help from another person taking care of personal grooming?: A Lot Help from another person toileting, which includes using toliet, bedpan, or urinal?: Total Help from another person bathing (including washing, rinsing, drying)?: A Lot Help from another person to put on and taking off regular upper body clothing?: A Lot Help from another person to put on and taking off regular lower body clothing?: Total 6 Click Score: 9   End of Session Nurse Communication: Mobility status;Other (comment) (RN prestent and assisting during majority of session with RN also changing R flank bandages during session)  Activity Tolerance: Patient tolerated treatment well;Patient limited by lethargy;Patient limited by  fatigue Patient left: in bed;with call bell/phone within reach;with nursing/sitter in room;with family/visitor present  OT Visit Diagnosis: Unsteadiness on feet (R26.81);Other abnormalities of gait and mobility (R26.89);Muscle weakness (generalized) (M62.81);Other (comment) (decreased activity tolerance)                Time: 1610-9604 OT Time Calculation (min): 33 min Charges:  OT General Charges $  OT Visit: 1 Visit OT Evaluation $OT Eval Moderate Complexity: 1 Mod  Derward Marple "[employer] M., OTR/L, MA Acute Rehab [phone]  Lendon Colonel 07/31/2023, 4:51 PM

## 2023-07-31 NOTE — Consult Note (Signed)
 NAME:  Seth Burch, MRN:  [id number], DOB:  May 22, 1955, LOS: 3 ADMISSION DATE:  07/28/2023, CONSULTATION DATE:  3/13\ REFERRING MD:  Cliffton Asters, CHIEF COMPLAINT:  s/p esophagectomy, renal failure    History of Present Illness:  68 year old male with past medical history of diabetes, dyslipidemia, hypertension, CKD, distal esophageal carcinoma and progressive dysphagia s/p EUS and neoadjuvant chemoradiation who presented to [facility] for esophagectomy.  On 3/10 he had Ivor-Lewis esophagectomy, pyloromyotomy, laparoscopic jejunostomy tube placement.  On 3/11 he was started on gentle tube feeds.  Having urinary retention with subsequent Foley catheter placement.  Overnight into 3/12 had difficulty with pain control.  On day of consultation, patient noted to be somnolent, difficulty with taking deep breaths and coughing.  He has had progressive worsening renal function with creatinine up to 2.49.  He has had low-grade fevers which is not likely to be due to respiratory atelectasis.  He was transferred to ICU for closer monitoring and CCM was consulted to help with medical management.  Pertinent  Medical History  diabetes, dyslipidemia, hypertension, CKD, distal esophageal carcinoma and progressive dysphagia s/p EUS and neoadjuvant chemoradiation  Significant Hospital Events: Including procedures, antibiotic start and stop dates in addition to other pertinent events   3/10: Esophagectomy, pyloromyotomy, jejunostomy placement 3/11: Gentle tube feeds started, urinary retention, increasing creatinine 3/12: Difficulty with pain control 3/13: Worsening renal function, low-grade fevers, not up and ambulating, transferred to ICU  Interim History / Subjective:  Moved to ICU. States he's "been better." No acute distress. On [place].   Objective   Blood pressure (!) 158/72, pulse (!) 122, temperature 98.2 F (36.8 C), temperature source Oral, resp. rate 19, height 5\' 10"  (1.778 m), weight 90.8 kg, SpO2 94%.         Intake/Output Summary (Last 24 hours) at 07/31/2023 1216 Last data filed at 07/31/2023 0510 Gross per 24 hour  Intake 2152.5 ml  Output 1280 ml  Net 872.5 ml   Filed Weights   07/29/23 0327 07/30/23 0648 07/31/23 0455  Weight: 90.5 kg 92.6 kg 90.8 kg    Examination: General: older male, laying in bed, no acute distress, flat affect  HENT: NCAT, dry mucous membranes, NGT  Lungs: rhonchi bilterally, [place], resp even and unlabored  Cardiovascular: s1s2, ST, no murmur Abdomen: rounded, soft, JP drain and J tube. Operative incisions healing  Extremities: no pitting edema  Neuro: awake, alert, oriented  GU: foley   Resolved Hospital Problem list    Assessment & Plan:  Esophageal adenocarcinoma s/p esophagectomy, pyloromyotomy and jejunostomy placement -[facility] primary management -Difficulty with pain control now on Tylenol, morphine, oxycodone, Robaxin but intermittently somnolent -Zofran as needed for nausea -Reglan every 6 hours -Continue tube feeds, free water -N.p.o. -Really needs PT/OT  Acute on chronic kidney disease Baseline appears around 1.3-1.4.  Currently 2.49.  900 cc urine output 3/12.  3/12 with urinary retention s/p Foley placement.  UA without UTI. - Renal ultrasound - CRRT watch - trend bmp, mag, phos - replete elytes - strict I&O - Avoid nephrotoxic agents, renally dose medications - ensure adequate renal perfusion   Fever Low grade fevers up to 100.1 thought likely 2/2 atelectasis. In setting of adenocarcinoma s/p chemoradiation. UA clear. CXR without obvious infiltrate. No leukocytosis.  - low threshold for antibiotic coverage with immunocompromise  - trend fever, wbc curve   Diabetes - ssi  - cbg q4h  - TC coverage q4h   Hypertension Hyperlipidemia - hydralazine q6h  - metoprolol 12.5mg  BID  -  start statin per J tube   Best Practice (right click and "Reselect all SmartList Selections" daily)   Diet/type: tubefeeds and NPO DVT prophylaxis:  LMWH Pressure ulcer(s): na GI prophylaxis: N/A Lines: N/A Foley:  Yes, and it is still needed Code Status:  full code Last date of multidisciplinary goals of care discussion [per primary]  Labs   CBC: Recent Labs  Lab 07/24/23 1145 07/28/23 0920 07/28/23 1453 07/28/23 1653 07/29/23 0218 07/30/23 0803 07/31/23 0500  WBC 5.2  --   --   --  6.1 10.4 10.4  HGB 12.9*   < > 9.9* 9.2* 10.9* 11.6* 9.7*  HCT 39.0   < > 29.0* 27.0* 32.3* 35.0* 30.0*  MCV 97.3  --   --   --  97.6 100.3* 101.0*  PLT 215  --   --   --  211 261 259   < > = values in this interval not displayed.    Basic Metabolic Panel: Recent Labs  Lab 07/24/23 1145 07/28/23 0920 07/28/23 1453 07/28/23 1653 07/29/23 0218 07/30/23 0803 07/31/23 0500  NA 139   < > 142 142 138 134* 140  K 4.3   < > 3.7 3.9 4.2 3.9 4.0  CL 106  --   --   --  109 105 111  CO2 28  --   --   --  22 22 22   GLUCOSE 119*  --   --   --  190* 255* 218*  BUN 21  --   --   --  19 19 32*  CREATININE 1.23  --   --   --  1.50* 1.88* 2.49*  CALCIUM 9.2  --   --   --  8.0* 8.0* 7.9*   < > = values in this interval not displayed.   GFR: Estimated Creatinine Clearance: 33.1 mL/min (A) (by C-G formula based on SCr of 2.49 mg/dL (H)). Recent Labs  Lab 07/24/23 1145 07/29/23 0218 07/30/23 0803 07/31/23 0500  WBC 5.2 6.1 10.4 10.4    Liver Function Tests: Recent Labs  Lab 07/24/23 1145  AST 23  ALT 20  ALKPHOS 109  BILITOT 0.6  PROT 7.3  ALBUMIN 4.0   No results for input(s): "LIPASE", "AMYLASE" in the last 168 hours. No results for input(s): "AMMONIA" in the last 168 hours.  ABG    Component Value Date/Time   PHART 7.4 07/29/2023 0514   PCO2ART 36 07/29/2023 0514   PO2ART 70 (L) 07/29/2023 0514   HCO3 22.3 07/29/2023 0514   TCO2 23 07/28/2023 1653   ACIDBASEDEF 2.0 07/29/2023 0514   O2SAT 95.2 07/29/2023 0514     Coagulation Profile: Recent Labs  Lab 07/24/23 1145  INR 0.9    Cardiac Enzymes: No results for  input(s): "CKTOTAL", "CKMB", "CKMBINDEX", "TROPONINI" in the last 168 hours.  HbA1C: Hgb A1c MFr Bld  Date/Time Value Ref Range Status  07/24/2023 11:45 AM 7.1 (H) 4.8 - 5.6 % Final    Comment:    (NOTE) Pre diabetes:          5.7%-6.4%  Diabetes:              >6.4%  Glycemic control for   <7.0% adults with diabetes     CBG: Recent Labs  Lab 07/30/23 2013 07/30/23 2318 07/31/23 0453 07/31/23 0828 07/31/23 1123  GLUCAP 172* 182* 189* 206* 198*    Review of Systems:   As above  Past Medical History:  He,  has a past  medical history of Arthritis, Cancer (HCC), Chronic kidney disease, Diabetes mellitus (HCC), Dyslipidemia, and Hypertension.   Surgical History:   Past Surgical History:  Procedure Laterality Date   ESOPHAGOGASTRODUODENOSCOPY N/A 07/28/2023   Procedure: EGD (ESOPHAGOGASTRODUODENOSCOPY);  Surgeon: [name], MD;  Location: [facility] OR;  Service: Thoracic;  Laterality: N/A;   ESOPHAGOGASTRODUODENOSCOPY (EGD) WITH PROPOFOL N/A 04/04/2023   Procedure: ESOPHAGOGASTRODUODENOSCOPY (EGD) WITH PROPOFOL;  Surgeon: Jeani Hawking, MD;  Location: [facility] ENDOSCOPY;  Service: Gastroenterology;  Laterality: N/A;   IR IMAGING GUIDED PORT INSERTION  04/09/2023   UPPER ESOPHAGEAL ENDOSCOPIC ULTRASOUND (EUS) N/A 04/04/2023   Procedure: UPPER ESOPHAGEAL ENDOSCOPIC ULTRASOUND (EUS);  Surgeon: Jeani Hawking, MD;  Location: [facility];  Service: Gastroenterology;  Laterality: N/A;     Social History:   reports that he has never smoked. He has never used smokeless tobacco. He reports current alcohol use of about 1.0 standard drink of alcohol per week. He reports that he does not use drugs.   Family History:  His family history is not on file.   Allergies No Known Allergies   Home Medications  Prior to Admission medications   Medication Sig Start Date End Date Taking? Authorizing Provider  amLODipine (NORVASC) 5 MG tablet Take 5 mg by mouth daily.   Yes [provider]  Aspirin 81 MG CAPS Take 81 mg by mouth daily.   Yes [provider]  atorvastatin (LIPITOR) 20 MG tablet Take 20 mg by mouth daily. 02/26/19  Yes [provider]  JARDIANCE 10 MG TABS tablet Take 10 mg by mouth daily.   Yes [provider]  LANTUS SOLOSTAR 100 UNIT/ML Solostar Pen Inject 26 Units into the skin daily. 02/19/23  Yes [provider]  metFORMIN (GLUCOPHAGE) 500 MG tablet Take 1,000 mg by mouth daily. 02/26/19  Yes [provider]  omeprazole (PRILOSEC) 40 MG capsule Take 40 mg by mouth daily. 03/13/23  Yes [provider]  ramipril (ALTACE) 10 MG capsule Take 20 mg by mouth daily. 03/04/19  Yes [provider]  tamsulosin (FLOMAX) 0.4 MG CAPS capsule Take 0.4 mg by mouth daily. 02/08/23  Yes [provider]  ACCU-CHEK GUIDE test strip 1 each by Other route 2 (two) times daily. Patient not taking: Reported on 07/11/2023 03/15/23   [provider]  Continuous Glucose Sensor (FREESTYLE LIBRE 3 SENSOR) MISC See admin instructions. 07/07/23   [provider]     Critical care time: 68    Lenard Galloway [facility] 07/31/23 12:38 PM  Please see [email or web address] for pager details.  From 7A-7P if no response, please call [phone] After hours, please call ELink [phone]

## 2023-07-31 NOTE — Progress Notes (Signed)
   Inpatient Rehabilitation Admissions Coordinator   I await further medical progress before beginning discussions of rehab venue options.  Ottie Glazier, RN, MSN Rehab Admissions Coordinator [phone] 07/31/2023 2:08 PM

## 2023-07-31 NOTE — Plan of Care (Signed)
  Problem: Education: Goal: Knowledge of General Education information will improve Description: Including pain rating scale, medication(s)/side effects and non-pharmacologic comfort measures Outcome: Progressing   Problem: Health Behavior/Discharge Planning: Goal: Ability to manage health-related needs will improve Outcome: Progressing   Problem: Clinical Measurements: Goal: Ability to maintain clinical measurements within normal limits will improve Outcome: Progressing Goal: Will remain free from infection Outcome: Progressing Goal: Diagnostic test results will improve Outcome: Progressing Goal: Respiratory complications will improve Outcome: Progressing Goal: Cardiovascular complication will be avoided Outcome: Progressing   Problem: Activity: Goal: Risk for activity intolerance will decrease Outcome: Progressing   Problem: Nutrition: Goal: Adequate nutrition will be maintained Outcome: Progressing   Problem: Coping: Goal: Level of anxiety will decrease Outcome: Progressing   Problem: Elimination: Goal: Will not experience complications related to bowel motility Outcome: Progressing Goal: Will not experience complications related to urinary retention Outcome: Progressing   Problem: Pain Managment: Goal: General experience of comfort will improve and/or be controlled Outcome: Progressing   Problem: Safety: Goal: Ability to remain free from injury will improve Outcome: Progressing   Problem: Skin Integrity: Goal: Risk for impaired skin integrity will decrease Outcome: Progressing   Problem: Education: Goal: Knowledge of the prescribed therapeutic regimen will improve Outcome: Progressing   Problem: Bowel/Gastric: Goal: Gastrointestinal status for postoperative course will improve Outcome: Progressing   Problem: Nutritional: Goal: Ability to achieve adequate nutritional intake will improve Outcome: Progressing   Problem: Clinical Measurements: Goal:  Postoperative complications will be avoided or minimized Outcome: Progressing   Problem: Respiratory: Goal: Ability to maintain a clear airway will improve Outcome: Progressing

## 2023-07-31 NOTE — Progress Notes (Signed)
 Physical Therapy Treatment Patient Details Name: Seth Burch MRN: [id number] DOB: 13-Jun-1955 Today's Date: 07/31/2023   History of Present Illness 68 y.o. 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 Pt with trace R ptx, + atelectasis. 3/13 worsening renal function with transfer to ICU. PMHx: HTN, DM    PT Comments  Pt in recliner upon arrival and agreeable to PT session. Worked on seated balance, transfers, and LE strength in today's session. Pt was fatigued upon arrival, however, was motivated for PT session. Pt was able to sit with no back support for ~10 minutes with CGA while RN changed dressings and for gown change. Pt required ModAx2 to stand with pt holding onto therapists posterior elbows. He was then able to take steps towards the bed with ModAx2 for safety as pt was unsteady with x1 LOB. Pt is progressing well towards goals. Pt would continue to benefit from >3hrs post acute rehab to work towards independence with mobility. Acute PT to follow.   SpO2 >98% on 2L Bp 156/81, HR 110-120 BPM with activity    If plan is discharge home, recommend the following: A lot of help with walking and/or transfers;A lot of help with bathing/dressing/bathroom;Assistance with cooking/housework;Assist for transportation;Help with stairs or ramp for entrance   Can travel by private vehicle      No  Equipment Recommendations  Rolling walker (2 wheels);BSC/3in1;Wheelchair (measurements PT);Wheelchair cushion (measurements PT)    Recommendations for Other Services Rehab consult     Precautions / Restrictions Precautions Precautions: Fall;Other (comment) Precaution/Restrictions Comments: L UQ jejunostomy, chest tube, JP bulb, NG tube, catheter Restrictions Weight Bearing Restrictions Per Provider Order: No     Mobility  Bed Mobility Overal bed mobility: Needs Assistance Bed Mobility: Rolling, Sit to Sidelying Rolling: Mod assist      Sit to sidelying: Max assist, +2 for physical assistance, +2 for safety/equipment General bed mobility comments: MaxAx2 to assist with LE management and to shift trunk/hips. Able to roll with ModA    Transfers Overall transfer level: Needs assistance Equipment used: 2 person hand held assist Transfers: Sit to/from Stand, Bed to chair/wheelchair/BSC Sit to Stand: Mod assist, +2 safety/equipment   Step pivot transfers: Mod assist, +2 safety/equipment    General transfer comment: ModAx2 to boost up and for steadying assist. Pt held onto PT/OT posterior elbow to perform steppivot transfer. Decreased assistance needed to take steps with ModA to assist with x1 LOB.    Ambulation/Gait    General Gait Details: deferred due to fatigue/lethargy    Balance Overall balance assessment: Needs assistance, Mild deficits observed, not formally tested Sitting-balance support: No upper extremity supported, Single extremity supported, Bilateral upper extremity supported, Feet supported Sitting balance-Leahy Scale: Fair Sitting balance - Comments: Largely Fair sitting balance with pt sitting up with unsupported back in recliner for bandage approx. 10 min while RN changed R flank bandages and UB dressing. Intermittent CGA to maintain sitting balance due to fatigue, lethargy, and increased pain with bandage change. Postural control: Right lateral lean, Other (comment) (pt leans to the right and anterior when seated at edge of recliner) Standing balance support: Bilateral upper extremity supported, During functional activity, Reliant on assistive device for balance Standing balance-Leahy Scale: Poor Standing balance comment: reliant on UE support and external support     Communication Communication Communication: No apparent difficulties Factors Affecting Communication: Other (comment) (Pt lethargy and requiring mildly increased time for processing and responding)  Cognition Arousal: Lethargic Behavior  During  Therapy: WFL for tasks assessed/performed   PT - Cognitive impairments: No apparent impairments      Following commands: Intact      Cueing Cueing Techniques: Verbal cues  Exercises General Exercises - Lower Extremity Ankle Circles/Pumps: AROM, Both, 10 reps, Supine Quad Sets: AROM, Both, Supine (2 reps) Straight Leg Raises: AROM, Both, Supine (x3 reps)    General Comments General comments (skin integrity, edema, etc.): Wife and daughter present through session and supportive. Gave SLR, ankle pumps, and quad sets for HEP      Pertinent Vitals/Pain Pain Assessment Pain Assessment: Faces Faces Pain Scale: Hurts even more Pain Location: nose at site of NG tube; R abdomen/flank Pain Descriptors / Indicators: Aching, Discomfort, Guarding Pain Intervention(s): Limited activity within patient's tolerance, Monitored during session, Repositioned    Home Living Family/patient expects to be discharged to:: Private residence Living Arrangements: Spouse/significant other Available Help at Discharge: Family;Available 24 hours/day Type of Home: House Home Access: Stairs to enter Entrance Stairs-Rails: Right;Left;Can reach both Entrance Stairs-Number of Steps: 4   Home Layout: One level Home Equipment: Shower seat          PT Goals (current goals can now be found in the care plan section) Acute Rehab PT Goals PT Goal Formulation: With patient/family Time For Goal Achievement: 08/13/23 Potential to Achieve Goals: Good Progress towards PT goals: Progressing toward goals    Frequency    Min 3X/week      PT Plan      Co-evaluation   Reason for Co-Treatment: For patient/therapist safety;To address functional/ADL transfers   OT goals addressed during session: ADL's and self-care;Other (comment) (Positioning)      AM-PAC PT "6 Clicks" Mobility   Outcome Measure  Help needed turning from your back to your side while in a flat bed without using bedrails?: A  Lot Help needed moving from lying on your back to sitting on the side of a flat bed without using bedrails?: A Lot Help needed moving to and from a bed to a chair (including a wheelchair)?: Total Help needed standing up from a chair using your arms (e.g., wheelchair or bedside chair)?: Total Help needed to walk in hospital room?: Total Help needed climbing 3-5 steps with a railing? : Total 6 Click Score: 8    End of Session Equipment Utilized During Treatment: Oxygen Activity Tolerance: Patient limited by fatigue Patient left: in bed;with call bell/phone within reach;with nursing/sitter in room;with family/visitor present Nurse Communication: Mobility status PT Visit Diagnosis: Unsteadiness on feet (R26.81);Muscle weakness (generalized) (M62.81);Other abnormalities of gait and mobility (R26.89)     Time: 2130-8657 PT Time Calculation (min) (ACUTE ONLY): 37 min  Charges:    $Therapeutic Activity: 8-22 mins PT General Charges $$ ACUTE PT VISIT: 1 Visit                     Hilton Cork, PT, DPT Secure Chat Preferred  Rehab Office [phone]   Arturo Morton Brion Aliment 07/31/2023, 5:07 PM

## 2023-07-31 NOTE — Progress Notes (Addendum)
 [address]       [place] Burch [address]             [phone]      3 Days Post-Op Procedure(s) (LRB): XI ROBOTIC ASSISTED IVOR LEWIS ESOPHAGECTOMY (N/A) EGD (ESOPHAGOGASTRODUODENOSCOPY) (N/A)  Subjective:  Patient somnolent today.. Continues to have difficulty with pain taking deep breaths, coughing.  Continues to deny N/V.  Has not really ambulated... Stressed importance with patient that he has to be up and out of bed, taking good deep breaths, coughing.. needs to ambulate  Objective: Vital signs in last 24 hours: Temp:  [97.6 F (36.4 C)-100.1 F (37.8 C)] 97.6 F (36.4 C) (03/13 0802) Pulse Rate:  [108-126] 122 (03/13 0802) Cardiac Rhythm: Sinus tachycardia (03/13 0706) Resp:  [12-24] 19 (03/13 0802) BP: (127-158)/(71-79) 158/72 (03/13 0802) SpO2:  [93 %-97 %] 94 % (03/13 0802) Weight:  [90.8 kg] 90.8 kg (03/13 0455)  Intake/Output from previous day: 03/12 0701 - 03/13 0700 In: 2152.5 [I.V.:26.2; NG/GT:2126.3] Out: 1280 [Urine:900; Emesis/NG output:255; Drains:50; Chest Tube:75]  General appearance: no distress and somnolent Heart: regular rate and rhythm and tachy Lungs: clear to auscultation bilaterally Abdomen: soft non-distended, minimal to no BS Extremities: extremities normal, atraumatic, no cyanosis or edema Wound: clean, some mild serous drainage  Lab Results: Recent Labs    07/30/23 0803 07/31/23 0500  WBC 10.4 10.4  HGB 11.6* 9.7*  HCT 35.0* 30.0*  PLT 261 259   BMET:  Recent Labs    07/30/23 0803 07/31/23 0500  NA 134* 140  K 3.9 4.0  CL 105 111  CO2 22 22  GLUCOSE 255* 218*  BUN 19 32*  CREATININE 1.88* 2.49*  CALCIUM 8.0* 7.9*    PT/INR: No results for input(s): "LABPROT", "INR" in the last 72 hours. ABG    Component Value Date/Time   PHART 7.4 07/29/2023 0514   HCO3 22.3 07/29/2023 0514   TCO2 23 07/28/2023 1653   ACIDBASEDEF 2.0 07/29/2023 0514   O2SAT 95.2 07/29/2023 0514   CBG (last 3)  Recent Labs     07/30/23 2318 07/31/23 0453 07/31/23 0828  GLUCAP 182* 189* 206*    Assessment/Plan: S/P Procedure(s) (LRB): XI ROBOTIC ASSISTED IVOR LEWIS ESOPHAGECTOMY (N/A) EGD (ESOPHAGOGASTRODUODENOSCOPY) (N/A)  Cv- Sinus Tach, H/O HTN- continue Hydralazine, Lopressor.. do not want to lower BP any further in setting of elevated creatinine Pulm- CT/JP output remain acceptable.. CXR with continued atelectasis, no evidence of pneumothorax, effusion Renal- CKD Stage 3 with AKI.. creatinine up to 2.49...[name] Albumin ordered by Dr. Cliffton Asters.. will place foley for accurate output..?550 cc urine yesterday.. may benefit from IV fluids with being NPO due to esophageal surgery, ? Nephrology consult if doesn't peak soon? GI- strict NPO.[name] tolerating tube feedings will increase to 44ml/hr, added prosource.. start free water today as recommended by Nutrition ID-low grade temp at times, likely due to respiratory atelectasis.[name] no evidence of infection at this time.. continue aggressive pulmonary toilet Pain control- patient is somnolent.. likely oversedated.. will stop PCA.. continue Oxycodone, prn IV morphine for breakthrough DM- sugars remain elevated in setting of tube feedings, continue SSIP added 4 units novolog yesterday, will likely need to increase... diabetes coordinator following Deconditioning- severe... patient must be out of bed and needs to ambulate.. PT eval recs CIR  Dispo- spoke with Dr. Cliffton Asters.[name] details of plan documented above... will transfer patient to ICU for closer monitoring.[name] CCM consult also to help manage   LOS: 3 days    [name], PA-C 07/31/2023  Agree Will transfer to ICU due to AKI  [name]

## 2023-07-31 NOTE — Progress Notes (Signed)
 Nutrition Follow-up  DOCUMENTATION CODES:   Non-severe (moderate) malnutrition in context of chronic illness  INTERVENTION:   Tube Feeding via J-tube:  TF goal: Osmolite 1.5 at 65 ml/hr (currently at 50 ml/hr with titration per surgery) TF at goal provides 2340 kcals, 98 g of protein and 1185 mL of free water   Recommend monitoring closely for any signs of TF intolerance including worsening abdominal distention, nausea, etc  Pro-Source TF20 60 mL daily currently for increased needs post-op. Provides additional 20 g of protein   Recommend free water flush of 200 mL q 4 hours: provides 1200 mL in 24 hours (does not include free water from TF or additional flushes). Osmolite 1.5 at current rate of 55 ml/hr provides around 1000 mL free water over 24 hours  Recommend considering addition of scheduled bowel regimen given no BM since admission, pt post surgery and has required significant pain medication. Reached out to MD  NUTRITION DIAGNOSIS:   Moderate Malnutrition related to chronic illness (esophageal cancer s/p chemo, radiation and surgery) as evidenced by percent weight loss, mild fat depletion, moderate muscle depletion.  Continue but being addressed  GOAL:   Patient will meet greater than or equal to 90% of their needs  Progressing  MONITOR:   TF tolerance, Labs, Weight trends, Diet advancement, Skin, I & O's  REASON FOR ASSESSMENT:   Consult Enteral/tube feeding initiation and management, Assessment of nutrition requirement/status (New J-tube post Esophagectomy)  ASSESSMENT:   68 yo male with hx of primary adenocarcinoma of distal third esophagus s/p chemo and radiation and admitted for scheduled esophagectomy.  PMH includes DM, HTN, dyslipidemia, CKD, IgG kappa MGUS  3/10 EGD, XI Robotic Assisted Ivor Lewis Esophagectomy, J-tube placement 3/11 Trickle TF initiated 3/12 Surgery increased TF to 40 ml/hr 3/13 Surgery increased TF to 55 ml/hr, transferred to ICU    Transferred to ICU this AM for closer monitoring US kidney pending  Osmolite 1.5 increased from 40 ml/hr to 55 ml/hr this AM per TCTS  Per RN, no leakage from around J-tube. Dressing over J-tube insertion site dry. RN does indicate some leakage around chest tube insertion site, dressing has been changed  Denies nausea or pain on visit this AM; pt with zofran prn and reglan q 6 hours. No BM since admission and no bowel regimen ordered. Receiving sig pain medication regimen post op and would recommend considering bowel regimen  Worsening renal function, noted free water ordered this AM 150 mL q 4 hours. UOP 900 mL in 24 hours, 205 mL thus far today  NG with 255 mL in 24 hours Chest tubes: 75 mL DP Drain: 50 mL in 24 hours  Labs: BUN 32 (trending up), Creatinine 2.49 (trending up) sodium 140 (wdl), potassium 4.0 (wdl) CBGs 172-237 (goal 140-180)-noted insulin regimen adjusted today   Meds: ss novolog, noted novolog 5 units q 4 hours and 5 units of lantus BID started today Reglan Oxycodone per tube q 4 hours prn, robaxin IV q 6 hours, morphine IV q 2 hours prn   Diet Order:   Diet Order             Diet NPO time specified  Diet effective now                   EDUCATION NEEDS:   Education needs have been addressed  Skin:  Skin Assessment: Skin Integrity Issues: Skin Integrity Issues:: Incisions Incisions: new chest tube, new J-tube (07/28/2022)  Last BM:  PTA  Height:  Ht Readings from Last 1 Encounters:  07/28/23 5\' 10"  (1.778 m)    Weight:   Wt Readings from Last 1 Encounters:  07/31/23 90.8 kg     BMI:  Body mass index is 28.72 kg/m.  Estimated Nutritional Needs:   Kcal:  2200-2400 kcals  Protein:  115-130 g  Fluid:  >/= 2L   Romelle Starcher MS, RDN, LDN, CNSC Registered Dietitian 3 Clinical Nutrition RD Inpatient Contact Info in Amion

## 2023-08-01 ENCOUNTER — Inpatient Hospital Stay (HOSPITAL_COMMUNITY)

## 2023-08-01 LAB — BASIC METABOLIC PANEL
Anion gap: 5 (ref 5–15)
BUN: 39 mg/dL — ABNORMAL HIGH (ref 8–23)
CO2: 24 mmol/L (ref 22–32)
Calcium: 7.8 mg/dL — ABNORMAL LOW (ref 8.9–10.3)
Chloride: 110 mmol/L (ref 98–111)
Creatinine, Ser: 2.29 mg/dL — ABNORMAL HIGH (ref 0.61–1.24)
GFR, Estimated: 31 mL/min — ABNORMAL LOW (ref >60)
Glucose, Bld: 136 mg/dL — ABNORMAL HIGH (ref 70–99)
Potassium: 3.5 mmol/L (ref 3.5–5.1)
Sodium: 139 mmol/L (ref 135–145)

## 2023-08-01 LAB — GLUCOSE, CAPILLARY
Glucose-Capillary: 131 mg/dL — ABNORMAL HIGH (ref 70–99)
Glucose-Capillary: 144 mg/dL — ABNORMAL HIGH (ref 70–99)
Glucose-Capillary: 203 mg/dL — ABNORMAL HIGH (ref 70–99)
Glucose-Capillary: 208 mg/dL — ABNORMAL HIGH (ref 70–99)
Glucose-Capillary: 208 mg/dL — ABNORMAL HIGH (ref 70–99)
Glucose-Capillary: 210 mg/dL — ABNORMAL HIGH (ref 70–99)
Glucose-Capillary: 216 mg/dL — ABNORMAL HIGH (ref 70–99)
Glucose-Capillary: 239 mg/dL — ABNORMAL HIGH (ref 70–99)

## 2023-08-01 LAB — CBC
HCT: 28.3 % — ABNORMAL LOW (ref 39.0–52.0)
Hemoglobin: 9.3 g/dL — ABNORMAL LOW (ref 13.0–17.0)
MCH: 33.5 pg (ref 26.0–34.0)
MCHC: 32.9 g/dL (ref 30.0–36.0)
MCV: 101.8 fL — ABNORMAL HIGH (ref 80.0–100.0)
Platelets: 218 10*3/uL (ref 150–400)
RBC: 2.78 MIL/uL — ABNORMAL LOW (ref 4.22–5.81)
RDW: 15.3 % (ref 11.5–15.5)
WBC: 8.6 10*3/uL (ref 4.0–10.5)
nRBC: 0 % (ref 0.0–0.2)

## 2023-08-01 MED ORDER — OSMOLITE 1.5 CAL PO LIQD
1000.0000 mL | ORAL | Status: DC
  Administered 2023-08-01 – 2023-08-06 (×6): 1000 mL
  Filled 2023-08-01: qty 1000

## 2023-08-01 MED ORDER — INSULIN ASPART 100 UNIT/ML IJ SOLN
0.0000 [IU] | INTRAMUSCULAR | Status: DC
  Administered 2023-08-01: 10 [IU] via SUBCUTANEOUS
  Administered 2023-08-02: 6 [IU] via SUBCUTANEOUS
  Administered 2023-08-02: 10 [IU] via SUBCUTANEOUS
  Administered 2023-08-02: 6 [IU] via SUBCUTANEOUS
  Administered 2023-08-02: 15 [IU] via SUBCUTANEOUS
  Administered 2023-08-02: 10 [IU] via SUBCUTANEOUS
  Administered 2023-08-02 – 2023-08-03 (×3): 18 [IU] via SUBCUTANEOUS

## 2023-08-01 MED ORDER — ALBUMIN HUMAN 5 % IV SOLN
12.5000 g | Freq: Four times a day (QID) | INTRAVENOUS | Status: AC
  Administered 2023-08-01 – 2023-08-02 (×4): 12.5 g via INTRAVENOUS
  Filled 2023-08-01 (×4): qty 250

## 2023-08-01 MED ORDER — SORBITOL 70 % SOLN
30.0000 mL | Freq: Once | Status: AC
  Administered 2023-08-01: 30 mL
  Filled 2023-08-01: qty 30

## 2023-08-01 MED ORDER — DOCUSATE SODIUM 50 MG/5ML PO LIQD
100.0000 mg | Freq: Two times a day (BID) | ORAL | Status: DC
  Administered 2023-08-01 – 2023-08-04 (×4): 100 mg
  Filled 2023-08-01 (×6): qty 10

## 2023-08-01 NOTE — Progress Notes (Signed)
      [address]       ,[place] [address]             [phone]                 4 Days Post-Op Procedure(s) (LRB): XI ROBOTIC ASSISTED IVOR LEWIS ESOPHAGECTOMY (N/A) EGD (ESOPHAGOGASTRODUODENOSCOPY) (N/A)   Events: No events _______________________________________________________________ Vitals: BP 127/65   Pulse 86   Temp 98.5 F (36.9 C) (Axillary)   Resp 17   Ht 5\' 10"  (1.778 m)   Wt 90.8 kg   SpO2 99%   BMI 28.72 kg/m  Filed Weights   07/29/23 0327 07/30/23 0648 07/31/23 0455  Weight: 90.5 kg 92.6 kg 90.8 kg     - Neuro: alert NAD   - Cardiovascular: sinus  Drips: none.      - Pulm: EWOB    ABG    Component Value Date/Time   PHART 7.4 07/29/2023 0514   PCO2ART 36 07/29/2023 0514   PO2ART 70 (L) 07/29/2023 0514   HCO3 22.3 07/29/2023 0514   TCO2 23 07/28/2023 1653   ACIDBASEDEF 2.0 07/29/2023 0514   O2SAT 95.2 07/29/2023 0514    - Abd: ND - Extremity: warm  .Intake/Output      03/13 0701 03/14 0700 03/14 0701 03/15 0700   P.O.     I.V. (mL/kg)     NG/GT 1308.3    IV Piggyback 162.6    Total Intake(mL/kg) 1470.9 (16.2)    Urine (mL/kg/hr) 1720 (0.8)    Emesis/NG output     Drains 50    Chest Tube 30    Total Output 1800    Net -329.1            _______________________________________________________________ Labs:    Latest Ref Rng & Units 08/01/2023    4:01 AM 07/31/2023    5:00 AM 07/30/2023    8:03 AM  CBC  WBC 4.0 - 10.5 K/uL 8.6  10.4  10.4   Hemoglobin 13.0 - 17.0 g/dL 9.3  9.7  91.4   Hematocrit 39.0 - 52.0 % 28.3  30.0  35.0   Platelets 150 - 400 K/uL 218  259  261       Latest Ref Rng & Units 08/01/2023    4:01 AM 07/31/2023    5:00 AM 07/30/2023    8:03 AM  CMP  Glucose 70 - 99 mg/dL 782  956  213   BUN 8 - 23 mg/dL 39  32  19   Creatinine 0.61 - 1.24 mg/dL 0.86  5.78  4.69   Sodium 135 - 145 mmol/L 139  140  134   Potassium 3.5 - 5.1 mmol/L 3.5  4.0  3.9   Chloride 98 - 111 mmol/L 110  111   105   CO2 22 - 32 mmol/L 24  22  22    Calcium 8.9 - 10.3 mg/dL 7.8  7.9  8.0     CXR: clear  _______________________________________________________________  Assessment and Plan: POD 4 s/p MIE  Neuro: pain controlled CV: stable Pulm: IS, ambulation Renal: creat down.  Will continue IVF GI: NPO.  Swallow on Monday Heme: stable ID: [email or web address] Endo: SSI Dispo: floor   Seth Burch 08/01/2023 8:15 AM

## 2023-08-01 NOTE — Progress Notes (Signed)
  Inpatient Rehabilitation Admissions Coordinator   Met with patient and wife at bedside for rehab assessment. We discussed goals and expectations of a possible CIR admit. It is too soon to determine rehab venue needs at this time. NGT to suction remains, as well as chest tube. I will follow his progress at a distance to assist when appropriate. Please call me with any questions.   Ottie Glazier, RN, MSN Rehab Admissions Coordinator [phone]

## 2023-08-01 NOTE — Progress Notes (Signed)
 Brief Nutrition Follow-up:  Noted plan to transfer pt out of ICU  Osmolite 1.5 increased to goal of 65 ml/hr this AM by MD. J-tube with minimal leakage, if any, per RN. Chest tube insertion site continues to leak  Free water flush of 200 mL q 4 hours continues, no IV fluids. Creatinine slightly improved today (2.29), BUN 39. Sodium 139 (wdl), potassium 3.5 (wdl)  +constipation, no BM since admission. Currently no ordered bowel regimen (prn or scheduled). Pt does have order for scheduled IV Reglan and Zofran prn. Discussed concerns with PCCM who plans to order bowel regimen.   Interventions:  1) Osmolite 1.5 at 65 ml/hr (TF at goal provides 2340 kcals, 98 g of protein and 1185 mL of free water) 2) Continue free water flush of 200 mL q 4 hours: provides 1200 mL in 24 hours (does not include free water from TF or additional flushes). Total free water 2385 mL in 24 hours 3) Recommend continuing bowel regimen until pt has BM, especially while on pain regimen 4) Pro-Source TF20 60 mL daily currently for increased needs post-op. Provides additional 20 g of protein   Romelle Starcher MS, RDN, LDN, CNSC Registered Dietitian 3 Clinical Nutrition RD Inpatient Contact Info in Amion

## 2023-08-01 NOTE — Progress Notes (Signed)
 Physical Therapy Treatment Patient Details Name: Seth Burch MRN: [id number] DOB: 04-Dec-1955 Today's Date: 08/01/2023   History of Present Illness 68 y.o. male presents to [facility] 07/28/23 for esophagectomy and EGD to address distal esophageal adenocarcinoma. Pt with trace R ptx, + atelectasis. 3/13 worsening renal function with transfer to ICU. PMHx: HTN, DM    PT Comments  Pt resting in bed on arrival and agreeable to session with continued progress towards acute goals. Pt continues to be limited by pain, general weakness, decreased activity tolerance and poor balance/postural reactions. Pt requiring grossly min A to complete bed mobility with assist needed to manage Les out of and into bed and elevate trunk to sitting. Pt able to boost to stand with light min A to steady on rise and cues for hand placement. Pt progressing gait for short in room distance, however pt fatiguing quickly and limited by pain at chest tube insertion site. Pt stating he sat up in chair for a couple of hours in the AM, educated pt on importance of time up OOB and encouraged pt to be up over weekend with pt verbalizing understanding. Patient will benefit from intensive inpatient follow-up therapy, >3 hours/day to maximize functional mobility gains. Pt continues to benefit from skilled PT services to progress toward functional mobility goals.    BP 158/72, HR 101-111 bpm with activity SpO2 97-99% on RA   If plan is discharge home, recommend the following: A lot of help with walking and/or transfers;A lot of help with bathing/dressing/bathroom;Assistance with cooking/housework;Assist for transportation;Help with stairs or ramp for entrance   Can travel by private vehicle        Equipment Recommendations  Rolling walker (2 wheels);BSC/3in1;Wheelchair (measurements PT);Wheelchair cushion (measurements PT)    Recommendations for Other Services       Precautions / Restrictions Precautions Precautions: Fall;Other  (comment) Precaution/Restrictions Comments: L UQ jejunostomy, chest tube, JP bulb, NG tube, catheter Restrictions Weight Bearing Restrictions Per Provider Order: No     Mobility  Bed Mobility Overal bed mobility: Needs Assistance Bed Mobility: Rolling, Sit to Sidelying Rolling: Min assist Sidelying to sit: Min assist     Sit to sidelying: Min assist General bed mobility comments: min A manage LEs and eleavate trunk to sitting    Transfers Overall transfer level: Needs assistance Equipment used: Rolling walker (2 wheels) Transfers: Sit to/from Stand Sit to Stand: Min assist           General transfer comment: light min A to steady on rise to standing x2    Ambulation/Gait Ambulation/Gait assistance: Min assist Gait Distance (Feet): 18 Feet Assistive device: Rolling walker (2 wheels) Gait Pattern/deviations: Step-through pattern, Decreased stride length, Trunk flexed, Knee flexed in stance - left, Knee flexed in stance - right Gait velocity: decr     General Gait Details: distance limited by fatigue and pain, knees and trunk flexed   Stairs             Wheelchair Mobility     Tilt Bed    Modified Rankin (Stroke Patients Only)       Balance Overall balance assessment: Needs assistance, Mild deficits observed, not formally tested Sitting-balance support: No upper extremity supported, Single extremity supported, Bilateral upper extremity supported, Feet supported Sitting balance-Leahy Scale: Fair     Standing balance support: Bilateral upper extremity supported, During functional activity, Reliant on assistive device for balance Standing balance-Leahy Scale: Poor Standing balance comment: reliant on UE support and external support  Communication Communication Communication: No apparent difficulties  Cognition Arousal: Alert Behavior During Therapy: Flat affect   PT - Cognitive impairments: No apparent  impairments                         Following commands: Intact      Cueing Cueing Techniques: Verbal cues  Exercises      General Comments General comments (skin integrity, edema, etc.): pt spouse present and support      Pertinent Vitals/Pain Pain Assessment Pain Assessment: Faces Faces Pain Scale: Hurts even more Pain Location: nose at site of NG tube; R abdomen/flank Pain Descriptors / Indicators: Aching, Discomfort, Guarding Pain Intervention(s): Monitored during session, Limited activity within patient's tolerance    Home Living                          Prior Function            PT Goals (current goals can now be found in the care plan section) Acute Rehab PT Goals Patient Stated Goal: to get better PT Goal Formulation: With patient/family Time For Goal Achievement: 08/13/23 Progress towards PT goals: Progressing toward goals    Frequency    Min 3X/week      PT Plan      Co-evaluation              AM-PAC PT "6 Clicks" Mobility   Outcome Measure  Help needed turning from your back to your side while in a flat bed without using bedrails?: A Lot Help needed moving from lying on your back to sitting on the side of a flat bed without using bedrails?: A Lot Help needed moving to and from a bed to a chair (including a wheelchair)?: A Lot Help needed standing up from a chair using your arms (e.g., wheelchair or bedside chair)?: A Lot Help needed to walk in hospital room?: A Lot Help needed climbing 3-5 steps with a railing? : Total 6 Click Score: 11    End of Session Equipment Utilized During Treatment: Gait belt (under axilla) Activity Tolerance: Patient limited by fatigue;Patient tolerated treatment well Patient left: in bed;with call bell/phone within reach;with family/visitor present Nurse Communication: Mobility status PT Visit Diagnosis: Unsteadiness on feet (R26.81);Muscle weakness (generalized) (M62.81);Other abnormalities  of gait and mobility (R26.89)     Time: 4098-1191 PT Time Calculation (min) (ACUTE ONLY): 26 min  Charges:    $Gait Training: 8-22 mins $Therapeutic Activity: 8-22 mins PT General Charges $$ ACUTE PT VISIT: 1 Visit                     Aminat Shelburne R. PTA Acute Rehabilitation Services Office: [phone]   Catalina Antigua 08/01/2023, 4:10 PM

## 2023-08-01 NOTE — Progress Notes (Signed)
 Brief PCCM Progress Note  Patient is 4 days post op from esophagectomy with Dr. Cliffton Asters, he is progressing well and has no ongoing critical care needs. He is slated to be transferred out ot ICU today. PCCM will sign off. Thank you for the opportunity to participate in this patient's care. Please contact if we can be of further assistance.  Denali Sharma D. Harris, NP-C [facility] Personal contact information can be found on Amion  If no contact or response made please call [phone] 08/01/2023, 9:13 AM

## 2023-08-01 NOTE — Progress Notes (Signed)
      [address]       [place] [address]             [phone]    POD # 4 esophagectomy  Sleeping currently  BP (!) 146/70   Pulse 100   Temp 98.4 F (36.9 C) (Oral)   Resp (!) 26   Ht 5\' 10"  (1.778 m)   Wt 90.8 kg   SpO2 97%   BMI 28.72 kg/m    Intake/Output Summary (Last 24 hours) at 08/01/2023 1723 Last data filed at 08/01/2023 1700 Gross per 24 hour  Intake 3359.86 ml  Output 2040 ml  Net 1319.86 ml   CBG elevated- SSi adjusted  Awaiting transfer to progressive bed  Viviann Spare C. Dorris Fetch, MD [facility] [phone]

## 2023-08-02 ENCOUNTER — Inpatient Hospital Stay (HOSPITAL_COMMUNITY)

## 2023-08-02 DIAGNOSIS — C155 Malignant neoplasm of lower third of esophagus: Secondary | ICD-10-CM | POA: Diagnosis not present

## 2023-08-02 DIAGNOSIS — C159 Malignant neoplasm of esophagus, unspecified: Secondary | ICD-10-CM | POA: Diagnosis not present

## 2023-08-02 LAB — GLUCOSE, CAPILLARY
Glucose-Capillary: 162 mg/dL — ABNORMAL HIGH (ref 70–99)
Glucose-Capillary: 204 mg/dL — ABNORMAL HIGH (ref 70–99)
Glucose-Capillary: 151 mg/dL — ABNORMAL HIGH (ref 70–99)
Glucose-Capillary: 260 mg/dL — ABNORMAL HIGH (ref 70–99)
Glucose-Capillary: 310 mg/dL — ABNORMAL HIGH (ref 70–99)

## 2023-08-02 LAB — CBC
HCT: 27.5 % — ABNORMAL LOW (ref 39.0–52.0)
Hemoglobin: 8.7 g/dL — ABNORMAL LOW (ref 13.0–17.0)
MCH: 32.7 pg (ref 26.0–34.0)
MCHC: 31.6 g/dL (ref 30.0–36.0)
MCV: 103.4 fL — ABNORMAL HIGH (ref 80.0–100.0)
Platelets: 217 10*3/uL (ref 150–400)
RBC: 2.66 MIL/uL — ABNORMAL LOW (ref 4.22–5.81)
RDW: 15.2 % (ref 11.5–15.5)
WBC: 9.2 10*3/uL (ref 4.0–10.5)
nRBC: 0 % (ref 0.0–0.2)

## 2023-08-02 LAB — BLOOD GAS, VENOUS
Acid-Base Excess: 1.7 mmol/L (ref 0.0–2.0)
Bicarbonate: 25.8 mmol/L (ref 20.0–28.0)
O2 Saturation: 94.5 %
Patient temperature: 37.1
pCO2, Ven: 38 mmHg — ABNORMAL LOW (ref 44–60)
pH, Ven: 7.44 — ABNORMAL HIGH (ref 7.25–7.43)
pO2, Ven: 61 mmHg — ABNORMAL HIGH (ref 32–45)

## 2023-08-02 LAB — BASIC METABOLIC PANEL
Anion gap: 10 (ref 5–15)
BUN: 39 mg/dL — ABNORMAL HIGH (ref 8–23)
CO2: 22 mmol/L (ref 22–32)
Calcium: 8.1 mg/dL — ABNORMAL LOW (ref 8.9–10.3)
Chloride: 112 mmol/L — ABNORMAL HIGH (ref 98–111)
Creatinine, Ser: 2.08 mg/dL — ABNORMAL HIGH (ref 0.61–1.24)
GFR, Estimated: 34 mL/min — ABNORMAL LOW (ref >60)
Glucose, Bld: 190 mg/dL — ABNORMAL HIGH (ref 70–99)
Potassium: 3.8 mmol/L (ref 3.5–5.1)
Sodium: 144 mmol/L (ref 135–145)

## 2023-08-02 LAB — PROCALCITONIN: Procalcitonin: 0.71 ng/mL

## 2023-08-02 MED ORDER — RACEPINEPHRINE HCL 2.25 % IN NEBU
INHALATION_SOLUTION | RESPIRATORY_TRACT | Status: AC
  Filled 2023-08-02: qty 0.5

## 2023-08-02 MED ORDER — INSULIN GLARGINE 100 UNIT/ML ~~LOC~~ SOLN
25.0000 [IU] | Freq: Two times a day (BID) | SUBCUTANEOUS | Status: DC
  Administered 2023-08-02 (×2): 25 [IU] via SUBCUTANEOUS
  Filled 2023-08-02 (×3): qty 0.25

## 2023-08-02 MED ORDER — FAMOTIDINE IN NACL 20-0.9 MG/50ML-% IV SOLN
20.0000 mg | INTRAVENOUS | Status: DC
  Administered 2023-08-02 – 2023-08-03 (×2): 20 mg via INTRAVENOUS
  Filled 2023-08-02 (×2): qty 50

## 2023-08-02 MED ORDER — LORAZEPAM 2 MG/ML IJ SOLN: 2.0000 mg | Freq: Once | INTRAMUSCULAR | Status: AC

## 2023-08-02 MED ORDER — ORAL CARE MOUTH RINSE: 15.0000 mL | OROMUCOSAL | Status: DC | PRN

## 2023-08-02 MED ORDER — IPRATROPIUM-ALBUTEROL 0.5-2.5 (3) MG/3ML IN SOLN
3.0000 mL | Freq: Once | RESPIRATORY_TRACT | Status: AC
  Administered 2023-08-02: 3 mL via RESPIRATORY_TRACT
  Filled 2023-08-02: qty 3

## 2023-08-02 MED ORDER — ALBUTEROL SULFATE (2.5 MG/3ML) 0.083% IN NEBU
2.5000 mg | INHALATION_SOLUTION | RESPIRATORY_TRACT | Status: DC | PRN
  Filled 2023-08-02: qty 3

## 2023-08-02 MED ORDER — DOCUSATE SODIUM 50 MG/5ML PO LIQD: 100.0000 mg | Freq: Two times a day (BID) | ORAL | Status: DC

## 2023-08-02 MED ORDER — ALBUTEROL SULFATE (2.5 MG/3ML) 0.083% IN NEBU
INHALATION_SOLUTION | RESPIRATORY_TRACT | Status: AC
  Administered 2023-08-02: 2.5 mg via RESPIRATORY_TRACT
  Filled 2023-08-02: qty 3

## 2023-08-02 MED ORDER — RACEPINEPHRINE HCL 2.25 % IN NEBU
INHALATION_SOLUTION | RESPIRATORY_TRACT | Status: AC
  Administered 2023-08-02: 0.5 mL
  Filled 2023-08-02: qty 0.5

## 2023-08-02 MED ORDER — ETOMIDATE 2 MG/ML IV SOLN
20.0000 mg | Freq: Once | INTRAVENOUS | Status: AC
  Administered 2023-08-02: 20 mg via INTRAVENOUS
  Filled 2023-08-02: qty 10

## 2023-08-02 MED ORDER — ROCURONIUM BROMIDE 10 MG/ML (PF) SYRINGE
100.0000 mg | PREFILLED_SYRINGE | Freq: Once | INTRAVENOUS | Status: AC
  Administered 2023-08-02: 100 mg via INTRAVENOUS
  Filled 2023-08-02: qty 10

## 2023-08-02 MED ORDER — POLYETHYLENE GLYCOL 3350 17 G PO PACK
17.0000 g | PACK | Freq: Every day | ORAL | Status: DC
  Filled 2023-08-02 (×2): qty 1

## 2023-08-02 MED ORDER — FENTANYL 2500MCG IN NS 250ML (10MCG/ML) PREMIX INFUSION
25.0000 ug/h | INTRAVENOUS | Status: DC
Start: 2023-08-02 — End: 2023-08-04
  Administered 2023-08-02: 25 ug/h via INTRAVENOUS
  Administered 2023-08-04: 75 ug/h via INTRAVENOUS
  Filled 2023-08-02 (×2): qty 250

## 2023-08-02 MED ORDER — DIPHENHYDRAMINE HCL 50 MG/ML IJ SOLN
25.0000 mg | Freq: Four times a day (QID) | INTRAMUSCULAR | Status: AC
Start: 2023-08-02 — End: 2023-08-03
  Administered 2023-08-02 – 2023-08-03 (×4): 25 mg via INTRAVENOUS
  Filled 2023-08-02 (×4): qty 1

## 2023-08-02 MED ORDER — NOREPINEPHRINE 4 MG/250ML-% IV SOLN: 0.0000 ug/min | INTRAVENOUS | Status: DC

## 2023-08-02 MED ORDER — NOREPINEPHRINE 4 MG/250ML-% IV SOLN
INTRAVENOUS | Status: AC
  Administered 2023-08-02: 2 ug/min via INTRAVENOUS
  Filled 2023-08-02: qty 250

## 2023-08-02 MED ORDER — ORAL CARE MOUTH RINSE
15.0000 mL | OROMUCOSAL | Status: DC
Start: 2023-08-03 — End: 2023-08-04
  Administered 2023-08-03 – 2023-08-04 (×22): 15 mL via OROMUCOSAL

## 2023-08-02 MED ORDER — FENTANYL CITRATE PF 50 MCG/ML IJ SOSY
25.0000 ug | PREFILLED_SYRINGE | Freq: Once | INTRAMUSCULAR | Status: AC
  Administered 2023-08-02: 25 ug via INTRAVENOUS
  Filled 2023-08-02: qty 1

## 2023-08-02 MED ORDER — IPRATROPIUM-ALBUTEROL 0.5-2.5 (3) MG/3ML IN SOLN
RESPIRATORY_TRACT | Status: AC
  Administered 2023-08-02: 3 mL
  Filled 2023-08-02: qty 3

## 2023-08-02 MED ORDER — HALOPERIDOL LACTATE 5 MG/ML IJ SOLN
5.0000 mg | Freq: Once | INTRAMUSCULAR | Status: AC
  Administered 2023-08-02: 5 mg via INTRAVENOUS
  Filled 2023-08-02: qty 1

## 2023-08-02 MED ORDER — FENTANYL BOLUS VIA INFUSION
25.0000 ug | INTRAVENOUS | Status: DC | PRN
  Administered 2023-08-02 (×2): 25 ug via INTRAVENOUS
  Administered 2023-08-03 (×2): 50 ug via INTRAVENOUS
  Administered 2023-08-03: 100 ug via INTRAVENOUS
  Administered 2023-08-03: 75 ug via INTRAVENOUS
  Administered 2023-08-04: 50 ug via INTRAVENOUS
  Administered 2023-08-04: 100 ug via INTRAVENOUS

## 2023-08-02 MED ORDER — DEXAMETHASONE SODIUM PHOSPHATE 4 MG/ML IJ SOLN
4.0000 mg | Freq: Two times a day (BID) | INTRAMUSCULAR | Status: DC
  Filled 2023-08-02: qty 1

## 2023-08-02 MED ORDER — DEXMEDETOMIDINE HCL IN NACL 400 MCG/100ML IV SOLN
0.0000 ug/kg/h | INTRAVENOUS | Status: DC
  Administered 2023-08-02: 0.4 ug/kg/h via INTRAVENOUS
  Filled 2023-08-02: qty 100

## 2023-08-02 MED ORDER — LORAZEPAM 2 MG/ML IJ SOLN
INTRAMUSCULAR | Status: AC
  Administered 2023-08-02: 2 mg via INTRAVENOUS
  Filled 2023-08-02: qty 1

## 2023-08-02 MED ORDER — MAGIC MOUTHWASH W/LIDOCAINE
10.0000 mL | Freq: Three times a day (TID) | ORAL | Status: DC | PRN
  Administered 2023-08-05: 10 mL via ORAL
  Filled 2023-08-02 (×2): qty 10

## 2023-08-02 MED ORDER — PIPERACILLIN-TAZOBACTAM 3.375 G IVPB
3.3750 g | Freq: Three times a day (TID) | INTRAVENOUS | Status: DC
Start: 2023-08-02 — End: 2023-08-09
  Administered 2023-08-02 – 2023-08-06 (×11): 3.375 g via INTRAVENOUS
  Filled 2023-08-02 (×11): qty 50

## 2023-08-02 MED ORDER — DEXAMETHASONE SODIUM PHOSPHATE 4 MG/ML IJ SOLN
4.0000 mg | Freq: Two times a day (BID) | INTRAMUSCULAR | Status: AC
Start: 2023-08-02 — End: 2023-08-04
  Administered 2023-08-02 – 2023-08-03 (×3): 4 mg via INTRAVENOUS
  Filled 2023-08-02 (×3): qty 1

## 2023-08-02 MED ORDER — LORAZEPAM 2 MG/ML IJ SOLN
0.5000 mg | Freq: Once | INTRAMUSCULAR | Status: AC
  Administered 2023-08-02: 0.5 mg via INTRAVENOUS
  Filled 2023-08-02: qty 1

## 2023-08-02 MED ORDER — SODIUM CHLORIDE 3 % IN NEBU
4.0000 mL | INHALATION_SOLUTION | Freq: Two times a day (BID) | RESPIRATORY_TRACT | Status: DC
  Administered 2023-08-02 – 2023-08-03 (×3): 4 mL via RESPIRATORY_TRACT
  Filled 2023-08-02 (×2): qty 4

## 2023-08-02 MED ORDER — VANCOMYCIN HCL IN DEXTROSE 1-5 GM/200ML-% IV SOLN
1000.0000 mg | INTRAVENOUS | Status: DC
  Administered 2023-08-02 – 2023-08-03 (×2): 1000 mg via INTRAVENOUS
  Filled 2023-08-02 (×2): qty 200

## 2023-08-02 MED ORDER — PROPOFOL 1000 MG/100ML IV EMUL
0.0000 ug/kg/min | INTRAVENOUS | Status: DC
  Administered 2023-08-02: 5 ug/kg/min via INTRAVENOUS
  Administered 2023-08-03 (×3): 25 ug/kg/min via INTRAVENOUS
  Administered 2023-08-03 – 2023-08-04 (×3): 35 ug/kg/min via INTRAVENOUS
  Filled 2023-08-02 (×7): qty 100

## 2023-08-02 NOTE — Procedures (Signed)
 Intubation Procedure Note  Seth Burch  [id number]  1956/01/08  Date:08/02/23  Time:6:31 PM   Provider Performing:Infant Doane C Katrinka Blazing    Procedure: Intubation (31500)  Indication(s) Respiratory Failure  Consent Verbal family   Anesthesia Etomidate and Rocuronium   Time Out Verified patient identification, verified procedure, site/side was marked, verified correct patient position, special equipment/implants available, medications/allergies/relevant history reviewed, required imaging and test results available.   Sterile Technique Usual hand hygeine, masks, and gloves were used   Procedure Description Patient positioned in bed supine.  Sedation given as noted above.  Patient was intubated with endotracheal tube using Glidescope.  View was Grade 1 full glottis  marked swelling of false cords noted, some small posterior pharyngeal secretions.  Number of attempts was 1.  Colorimetric CO2 detector was consistent with tracheal placement.   Complications/Tolerance None; patient tolerated the procedure well. Chest X-ray is ordered to verify placement.   EBL Minimal   Specimen(s) None

## 2023-08-02 NOTE — Progress Notes (Signed)
 08/02/2023    I have seen and evaluated the patient for respiratory distress.   S:  Called back for increased WOB. +wheezing and anxiety   O: Blood pressure 96/66, pulse 64, temperature 97.6 F (36.4 C), temperature source Oral, resp. rate 17, height 5\' 3"  (1.6 m), weight 81.6 kg, SpO2 94 %.    Anxious man sitting in bed Bilateral wheezing Moves to command RASS +1  New CXR clear   A:  ?bronchospasm +/- panic attack S/p esophagectomy AKI Muscular deconditioing   P:  Duoneb, if responds positively will do yupelri/brovana standing Will also concurrently work toward getting anxiety under control If does not respond to duoneb, need to cancel progressive order Will check on him tomorrow   Myrla Halsted MD [facility] Prefer epic messenger for cross cover needs If after hours, please call E-link

## 2023-08-02 NOTE — Progress Notes (Signed)
 08/02/2023 Duonebs no help Agitated trying to get out of bed Lungs now with inspiratory stridor type sounds but moving air Precedex+bipap+couple doses of dexamethasone DC transfer order Wife updated  31 min cc time Myrla Halsted MD PCCM

## 2023-08-02 NOTE — Progress Notes (Signed)
 Sputum collected via bronch and sent to lab

## 2023-08-02 NOTE — Progress Notes (Signed)
 5 Days Post-Op Procedure(s) (LRB): XI ROBOTIC ASSISTED IVOR LEWIS ESOPHAGECTOMY (N/A) EGD (ESOPHAGOGASTRODUODENOSCOPY) (N/A) Subjective: C/o sore throat  Objective: Vital signs in last 24 hours: Temp:  [97.6 F (36.4 C)-100.4 F (38 C)] 97.6 F (36.4 C) (03/15 0700) Pulse Rate:  [78-105] 91 (03/15 0600) Cardiac Rhythm: Normal sinus rhythm (03/15 0800) Resp:  [6-37] 22 (03/15 0600) BP: (103-162)/(52-77) 147/65 (03/15 0600) SpO2:  [96 %-100 %] 100 % (03/15 0600) Weight:  [90 kg] 90 kg (03/15 0500)  Hemodynamic parameters for last 24 hours:    Intake/Output from previous day: 03/14 0701 - 03/15 0700 In: 4093 [NG/GT:3239.8; IV Piggyback:853.2] Out: 1985 [Urine:1725; Emesis/NG output:155; Drains:45; Chest Tube:60] Intake/Output this shift: No intake/output data recorded.  General appearance: alert, cooperative, and no distress Neurologic: intact Heart: regular rate and rhythm Lungs: diminished breath sounds bibasilar Abdomen: soft  Lab Results: Recent Labs    08/01/23 0401 08/02/23 0458  WBC 8.6 9.2  HGB 9.3* 8.7*  HCT 28.3* 27.5*  PLT 218 217   BMET:  Recent Labs    08/01/23 0401 08/02/23 0458  NA 139 144  K 3.5 3.8  CL 110 112*  CO2 24 22  GLUCOSE 136* 190*  BUN 39* 39*  CREATININE 2.29* 2.08*  CALCIUM 7.8* 8.1*    PT/INR: No results for input(s): "LABPROT", "INR" in the last 72 hours. ABG    Component Value Date/Time   PHART 7.4 07/29/2023 0514   HCO3 22.3 07/29/2023 0514   TCO2 23 07/28/2023 1653   ACIDBASEDEF 2.0 07/29/2023 0514   O2SAT 95.2 07/29/2023 0514   CBG (last 3)  Recent Labs    08/01/23 2357 08/02/23 0357 08/02/23 0730  GLUCAP 208* 162* 204*    Assessment/Plan: S/P Procedure(s) (LRB): XI ROBOTIC ASSISTED IVOR LEWIS ESOPHAGECTOMY (N/A) EGD (ESOPHAGOGASTRODUODENOSCOPY) (N/A) POD # 5 NEURO- intact CV- in Sr RESP- continue IS RENAL- AKI- creatinine peaked at 2.5, 2.0 this AM ENDO- CBG persistently elevated  Increase Lantus,  stop novolog, continue SSI Q4 Gi- tolerating TF  Swallow Monday Anemia secondary to ABL- monitor SCD + enoxaparin Ambulate   LOS: 5 days    Seth Burch 08/02/2023

## 2023-08-02 NOTE — Progress Notes (Signed)
 Pharmacy Antibiotic Note  Seth Burch is a 68 y.o. male admitted on 07/28/2023 with esophagectomy. Pt now with worsening respiratory status requiring intubation. Pharmacy has been consulted for vancomycin and Zosyn dosing to cover PNA. AKI is improving.  Plan: Zosyn 3.375g IV EI q8h Vancomycin 1000mg  IV q24h - est AUC  449 Follow Cr closely Vancomycin levels PRN  Height: 5\' 10"  (177.8 cm) Weight: 90 kg (198 lb 6.6 oz) IBW/kg (Calculated) : 73  Temp (24hrs), Avg:97.7 F (36.5 C), Min:96 F (35.6 C), Max:100.4 F (38 C)  Recent Labs  Lab 07/29/23 0218 07/30/23 0803 07/31/23 0500 08/01/23 0401 08/02/23 0458  WBC 6.1 10.4 10.4 8.6 9.2  CREATININE 1.50* 1.88* 2.49* 2.29* 2.08*    Estimated Creatinine Clearance: 39.4 mL/min (A) (by C-G formula based on SCr of 2.08 mg/dL (H)).    No Known Allergies   [name], PharmD, BCPS, [facility] Clinical Pharmacist [phone] Please check AMION for all [facility] Pharmacy numbers 08/02/2023

## 2023-08-02 NOTE — Progress Notes (Signed)
      [address]       [name] [address]             [phone]      Says his breathing is better, but audibly congested  BP (!) 152/71   Pulse (!) 106   Temp (!) 96 F (35.6 C) (Axillary)   Resp 16   Ht 5\' 10"  (1.778 m)   Wt 90 kg   SpO2 96%   BMI 28.47 kg/m   2L [place] 95% sat  Examined with Dr. Katrinka Blazing CCM Will add hypertonic saline and flutter valve, encourage patient to cough  Viviann Spare C. Dorris Fetch, MD [facility] [phone]

## 2023-08-02 NOTE — Procedures (Signed)
 Bronchoscopy Procedure Note  Seth Burch  [id number]  [birth year]  Date:08/02/23  Time:6:32 PM   Provider Performing:Josi Roediger C Katrinka Blazing   Procedure(s):  Flexible bronchoscopy with bronchial alveolar lavage [phone]) and Initial Therapeutic Aspiration of Tracheobronchial Tree [phone])  Indication(s) Stridor  Consent Verbal  Anesthesia In place for intubation   Time Out Verified patient identification, verified procedure, site/side was marked, verified correct patient position, special equipment/implants available, medications/allergies/relevant history reviewed, required imaging and test results available.   Sterile Technique Usual hand hygiene, masks, gowns, and gloves were used   Procedure Description Bronchoscope advanced through endotracheal tube and into airway.   Thick mucus in trachea suctioned, partially occluding L mainstem which explains exam pre-intubation Further down not much mucus but BAL with plug filled clear fluid. Suspicion is aspiration and mucus plugging with recurrent false vocal cord irritation leading to swelling, stridor, resp failure    Complications/Tolerance None; patient tolerated the procedure well. Chest X-ray is needed post procedure.   EBL Minimal   Specimen(s) BAL Lingula

## 2023-08-02 NOTE — Progress Notes (Signed)
 Patient sitting in chair wanting to get back in bed. Noticed increased work of breathing, got patient back in bed and assessed lung sounds. Diminished lung sounds with wheezing in upper airway. Contacted Dr Katrinka Blazing. Chest XRAY ordered, Ativan ordered and given. Ativan had no effect with anxiety, patient wanting to get out of bed and fidgety. Paged Dr Katrinka Blazing again who ordered Duoneb. Paged Dr Dorris Fetch who stated to follow what CCM wanted with no new orders. Duoneb did not work for WOB or upper airway wheezing so Dr Katrinka Blazing ordered Racepi and Precedex. Precedex started and RaceEPi given. Awaiting patient to calm down for BIPAP.

## 2023-08-03 ENCOUNTER — Inpatient Hospital Stay (HOSPITAL_COMMUNITY)

## 2023-08-03 DIAGNOSIS — C159 Malignant neoplasm of esophagus, unspecified: Secondary | ICD-10-CM | POA: Diagnosis not present

## 2023-08-03 DIAGNOSIS — I952 Hypotension due to drugs: Secondary | ICD-10-CM

## 2023-08-03 DIAGNOSIS — C155 Malignant neoplasm of lower third of esophagus: Secondary | ICD-10-CM | POA: Diagnosis not present

## 2023-08-03 DIAGNOSIS — J9601 Acute respiratory failure with hypoxia: Secondary | ICD-10-CM

## 2023-08-03 DIAGNOSIS — E1165 Type 2 diabetes mellitus with hyperglycemia: Secondary | ICD-10-CM

## 2023-08-03 LAB — BASIC METABOLIC PANEL
Anion gap: 9 (ref 5–15)
BUN: 53 mg/dL — ABNORMAL HIGH (ref 8–23)
CO2: 22 mmol/L (ref 22–32)
Calcium: 7.9 mg/dL — ABNORMAL LOW (ref 8.9–10.3)
Chloride: 110 mmol/L (ref 98–111)
Creatinine, Ser: 2.21 mg/dL — ABNORMAL HIGH (ref 0.61–1.24)
GFR, Estimated: 32 mL/min — ABNORMAL LOW (ref >60)
Glucose, Bld: 379 mg/dL — ABNORMAL HIGH (ref 70–99)
Potassium: 4.4 mmol/L (ref 3.5–5.1)
Sodium: 141 mmol/L (ref 135–145)

## 2023-08-03 LAB — CBC
HCT: 25.3 % — ABNORMAL LOW (ref 39.0–52.0)
Hemoglobin: 8.2 g/dL — ABNORMAL LOW (ref 13.0–17.0)
MCH: 33.1 pg (ref 26.0–34.0)
MCHC: 32.4 g/dL (ref 30.0–36.0)
MCV: 102 fL — ABNORMAL HIGH (ref 80.0–100.0)
Platelets: 207 10*3/uL (ref 150–400)
RBC: 2.48 MIL/uL — ABNORMAL LOW (ref 4.22–5.81)
RDW: 15.1 % (ref 11.5–15.5)
WBC: 9.4 10*3/uL (ref 4.0–10.5)
nRBC: 0 % (ref 0.0–0.2)

## 2023-08-03 LAB — GLUCOSE, CAPILLARY
Glucose-Capillary: 172 mg/dL — ABNORMAL HIGH (ref 70–99)
Glucose-Capillary: 179 mg/dL — ABNORMAL HIGH (ref 70–99)
Glucose-Capillary: 184 mg/dL — ABNORMAL HIGH (ref 70–99)
Glucose-Capillary: 185 mg/dL — ABNORMAL HIGH (ref 70–99)
Glucose-Capillary: 185 mg/dL — ABNORMAL HIGH (ref 70–99)
Glucose-Capillary: 196 mg/dL — ABNORMAL HIGH (ref 70–99)
Glucose-Capillary: 203 mg/dL — ABNORMAL HIGH (ref 70–99)
Glucose-Capillary: 204 mg/dL — ABNORMAL HIGH (ref 70–99)
Glucose-Capillary: 256 mg/dL — ABNORMAL HIGH (ref 70–99)
Glucose-Capillary: 271 mg/dL — ABNORMAL HIGH (ref 70–99)
Glucose-Capillary: 296 mg/dL — ABNORMAL HIGH (ref 70–99)
Glucose-Capillary: 312 mg/dL — ABNORMAL HIGH (ref 70–99)
Glucose-Capillary: 314 mg/dL — ABNORMAL HIGH (ref 70–99)
Glucose-Capillary: 315 mg/dL — ABNORMAL HIGH (ref 70–99)
Glucose-Capillary: 317 mg/dL — ABNORMAL HIGH (ref 70–99)
Glucose-Capillary: 325 mg/dL — ABNORMAL HIGH (ref 70–99)
Glucose-Capillary: 327 mg/dL — ABNORMAL HIGH (ref 70–99)
Glucose-Capillary: 333 mg/dL — ABNORMAL HIGH (ref 70–99)

## 2023-08-03 LAB — MAGNESIUM: Magnesium: 2.5 mg/dL — ABNORMAL HIGH (ref 1.7–2.4)

## 2023-08-03 LAB — TRIGLYCERIDES: Triglycerides: 184 mg/dL — ABNORMAL HIGH (ref <150)

## 2023-08-03 MED ORDER — INSULIN REGULAR(HUMAN) IN NACL 100-0.9 UT/100ML-% IV SOLN
INTRAVENOUS | Status: DC
  Administered 2023-08-03: 10 [IU]/h via INTRAVENOUS
  Administered 2023-08-03: 9.5 [IU]/h via INTRAVENOUS
  Administered 2023-08-03: 14 [IU]/h via INTRAVENOUS
  Administered 2023-08-04: 8 [IU]/h via INTRAVENOUS
  Administered 2023-08-04: 9.5 [IU]/h via INTRAVENOUS
  Administered 2023-08-04: 4.4 [IU]/h via INTRAVENOUS
  Administered 2023-08-06: 4 [IU]/h via INTRAVENOUS
  Filled 2023-08-03 (×6): qty 100

## 2023-08-03 MED ORDER — HYDRALAZINE HCL 20 MG/ML IJ SOLN: 10.0000 mg | Freq: Four times a day (QID) | INTRAMUSCULAR | Status: DC | PRN

## 2023-08-03 MED ORDER — DEXTROSE 50 % IV SOLN: 0.0000 mL | INTRAVENOUS | Status: DC | PRN

## 2023-08-03 MED ORDER — INSULIN GLARGINE 100 UNIT/ML ~~LOC~~ SOLN
30.0000 [IU] | Freq: Two times a day (BID) | SUBCUTANEOUS | Status: DC
  Filled 2023-08-03 (×2): qty 0.3

## 2023-08-03 NOTE — Progress Notes (Signed)
 There was a consult for placing a PIV access. Checked compatibility of medications, Norepinephrine and regular insulin were half studies compatible and the other non-compatible. Informed patient's RN regarding this matter and ask pharmacist for advice. Patient's RN asked the pharmacist and no PIV access needed at this time. HS [employer]

## 2023-08-03 NOTE — Progress Notes (Signed)
 eLink Physician-Brief Progress Note Patient Name: CAL GINDLESPERGER DOB: 1956/05/02 MRN: [id number]   Date of Service  08/03/2023  HPI/Events of Note  CBG's 300's since this evening receiving 18 units each time He received Lantus 25 BID yesterday  Patient takes Lantus 26 units daily at home Currently on systemic steroids (dexamethasone 4 q 12)  eICU Interventions  Increased Lantus to 30 BID. Will defer to bedside rounding team on further adjustments Discussed with [facility]     Intervention Category Intermediate Interventions: Hyperglycemia - evaluation and treatment  Rosalie Gums Azia Toutant 08/03/2023, 3:37 AM

## 2023-08-03 NOTE — Progress Notes (Signed)
 eLink Physician-Brief Progress Note Patient Name: Seth Burch DOB: Oct 16, 1955 MRN: [id number]   Date of Service  08/03/2023  HPI/Events of Note  Received request for restraints Patient seen intubated and a risk for self harm by pulling lines and tubes  eICU Interventions  Bilateral soft wrist restraints ordered Bedside team to assess in am if restraints to be continued     Intervention Category Intermediate Interventions: Other:  Darl Pikes 08/03/2023, 4:36 AM

## 2023-08-03 NOTE — Consult Note (Signed)
 NAME:  Seth Burch, MRN:  [id number], DOB:  1955/12/12, LOS: 6 ADMISSION DATE:  07/28/2023, CONSULTATION DATE:  3/13\ REFERRING MD:  Cliffton Asters, CHIEF COMPLAINT:  s/p esophagectomy, renal failure    History of Present Illness:  68 year old male with past medical history of diabetes, dyslipidemia, hypertension, CKD, distal esophageal carcinoma and progressive dysphagia s/p EUS and neoadjuvant chemoradiation who presented to [facility] for esophagectomy.  On 3/10 he had Ivor-Lewis esophagectomy, pyloromyotomy, laparoscopic jejunostomy tube placement.  On 3/11 he was started on gentle tube feeds.  Having urinary retention with subsequent Foley catheter placement.  Overnight into 3/12 had difficulty with pain control.  On day of consultation, patient noted to be somnolent, difficulty with taking deep breaths and coughing.  He has had progressive worsening renal function with creatinine up to 2.49.  He has had low-grade fevers which is not likely to be due to respiratory atelectasis.  He was transferred to ICU for closer monitoring and CCM was consulted to help with medical management.  Pertinent  Medical History  diabetes, dyslipidemia, hypertension, CKD, distal esophageal carcinoma and progressive dysphagia s/p EUS and neoadjuvant chemoradiation  Significant Hospital Events: Including procedures, antibiotic start and stop dates in addition to other pertinent events   3/10: Esophagectomy, pyloromyotomy, jejunostomy placement 3/11: Gentle tube feeds started, urinary retention, increasing creatinine 3/12: Difficulty with pain control 3/13: Worsening renal function, low-grade fevers, not up and ambulating, transferred to ICU 3/15 sudden onset stridor refractory to conservative measures requiring intubation: marked false cord swelling noted  Interim History / Subjective:  Sedated on vent Family at bedside Low dose pressors to facilitate sedation  Objective   Blood pressure (!) 107/51, pulse 60,  temperature 97.8 F (36.6 C), temperature source Oral, resp. rate (!) 22, height 5\' 10"  (1.778 m), weight 87.5 kg, SpO2 96%.    Vent Mode: PRVC FiO2 (%):  [30 %-40 %] 30 % Set Rate:  [22 bmp] 22 bmp Vt Set:  [580 mL] 580 mL PEEP:  [5 cmH20] 5 cmH20 Plateau Pressure:  [19 cmH20-20 cmH20] 20 cmH20   Intake/Output Summary (Last 24 hours) at 08/03/2023 1031 Last data filed at 08/03/2023 4098 Gross per 24 hour  Intake 3157.97 ml  Output 1125 ml  Net 2032.97 ml   Filed Weights   07/31/23 0455 08/02/23 0500 08/03/23 0500  Weight: 90.8 kg 90 kg 87.5 kg    Examination: Sedated appears comfortable Lung mechanics normal and lungs clear Incision sites look fine No edema  Labs, CXR look fine Sugars up with dexamethasone  Resolved Hospital Problem list    Assessment & Plan:  Esophageal adenocarcinoma s/p esophagectomy, pyloromyotomy and jejunostomy placement Acute on chronic kidney disease Diabetes w/ hyperglycemia worsened by dexamethasone Acute respiratory failure secondary to vocal cord swelling and likely aspiration s/p intubation and therapeutic bronch 3/15 evening Sedation associated hypotension  - Abx started, f/u bronch culture data - NGT activity per primary - Insulin gtt - Vent bundle - Gave course of steroids, benadryl, pepcid - Avoid nephrotoxins - Give a full 24h of rest then tomorrow would look with laryngoscope to assure cord swelling is improved then can work toward extubation (hopefully).  Family updated  Best Practice (right click and "Reselect all SmartList Selections" daily)   Diet/type: tubefeeds and NPO DVT prophylaxis: LMWH Pressure ulcer(s): na GI prophylaxis: N/A Lines: N/A Foley:  Yes, and it is still needed Code Status:  full code Last date of multidisciplinary goals of care discussion [per primary]  32 min cc time Myrla Halsted  MD PCCM

## 2023-08-03 NOTE — Plan of Care (Signed)
  Problem: Clinical Measurements: Goal: Ability to maintain clinical measurements within normal limits will improve Outcome: Progressing Goal: Will remain free from infection Outcome: Progressing Goal: Diagnostic test results will improve Outcome: Progressing Goal: Respiratory complications will improve Outcome: Progressing Goal: Cardiovascular complication will be avoided Outcome: Progressing   Problem: Nutrition: Goal: Adequate nutrition will be maintained Outcome: Progressing   Problem: Pain Managment: Goal: General experience of comfort will improve and/or be controlled Outcome: Progressing   Problem: Safety: Goal: Ability to remain free from injury will improve Outcome: Progressing   Problem: Skin Integrity: Goal: Risk for impaired skin integrity will decrease Outcome: Progressing   Problem: Bowel/Gastric: Goal: Gastrointestinal status for postoperative course will improve Outcome: Progressing   Problem: Nutritional: Goal: Ability to achieve adequate nutritional intake will improve Outcome: Progressing   Problem: Clinical Measurements: Goal: Postoperative complications will be avoided or minimized Outcome: Progressing

## 2023-08-03 NOTE — Progress Notes (Addendum)
 Pt NTS at this time with a moderate return of clear/white secretions. Pt tolerated well with no complications.

## 2023-08-03 NOTE — Progress Notes (Signed)
 6 Days Post-Op Procedure(s) (LRB): XI ROBOTIC ASSISTED IVOR LEWIS ESOPHAGECTOMY (N/A) EGD (ESOPHAGOGASTRODUODENOSCOPY) (N/A) Subjective: Intubated, sedated  Objective: Vital signs in last 24 hours: Temp:  [96 F (35.6 C)-98.8 F (37.1 C)] 97.8 F (36.6 C) (03/16 0753) Pulse Rate:  [74-136] 77 (03/16 0600) Cardiac Rhythm: Sinus tachycardia (03/15 2000) Resp:  [14-37] 22 (03/16 0600) BP: (79-204)/(47-169) 79/54 (03/16 0600) SpO2:  [80 %-100 %] 95 % (03/16 0600) FiO2 (%):  [30 %-40 %] 30 % (03/16 0129) Weight:  [87.5 kg] 87.5 kg (03/16 0500)  Hemodynamic parameters for last 24 hours:    Intake/Output from previous day: 03/15 0701 - 03/16 0700 In: 2637.7 [I.V.:275.7; NG/GT:1560; IV Piggyback:312] Out: 1315 [Urine:1155; Drains:70; Chest Tube:90] Intake/Output this shift: No intake/output data recorded.  General appearance: intubated Heart: regular rate and rhythm Lungs: clear to auscultation bilaterally Abdomen: tube in place, soft  Lab Results: Recent Labs    08/02/23 0458 08/03/23 0219  WBC 9.2 9.4  HGB 8.7* 8.2*  HCT 27.5* 25.3*  PLT 217 207   BMET:  Recent Labs    08/02/23 0458 08/03/23 0219  NA 144 141  K 3.8 4.4  CL 112* 110  CO2 22 22  GLUCOSE 190* 379*  BUN 39* 53*  CREATININE 2.08* 2.21*  CALCIUM 8.1* 7.9*    PT/INR: No results for input(s): "LABPROT", "INR" in the last 72 hours. ABG    Component Value Date/Time   PHART 7.4 07/29/2023 0514   HCO3 25.8 08/02/2023 2006   TCO2 23 07/28/2023 1653   ACIDBASEDEF 2.0 07/29/2023 0514   O2SAT 94.5 08/02/2023 2006   CBG (last 3)  Recent Labs    08/03/23 0022 08/03/23 0258 08/03/23 0751  GLUCAP 312* 333* 327*    Assessment/Plan: S/P Procedure(s) (LRB): XI ROBOTIC ASSISTED IVOR LEWIS ESOPHAGECTOMY (N/A) EGD (ESOPHAGOGASTRODUODENOSCOPY) (N/A) POD # 6 Intubated yesterday for acute respiratory failure secondary to vocal cord swelling NEURO- sedated CV- in SR, on low dose norepi for  hypotension RESP- VDRF   Good oxygenation on 30% FiO2  Dr. Katrinka Blazing wants to wait until tomorrow to extubate to give time for cord edema to resolve ID- afebrile and WBC normal, but PCT 0.7  Started on vanco and Zosyn empirically for possible aspiration RENAL- creatinine up slightly to 2.2 from 2.1- monitor ENDO- CBG markedly elevated in 300s on steroids  Will start insulin drip GI- tolerating TF  On Protonix Anemia Hgb 8.2, monitor SCD + enoxaparin   LOS: 6 days    Loreli Slot 08/03/2023

## 2023-08-03 NOTE — Progress Notes (Signed)
      [address]       [place] [address]             [phone]      Intubated, sedated  BP (!) 99/54 (BP Location: Right Arm)   Pulse 67   Temp 97.8 F (36.6 C) (Axillary)   Resp (!) 22   Ht 5\' 10"  (1.778 m)   Wt 87.5 kg   SpO2 96%   BMI 27.68 kg/m   Norepi at 1 PRVC 22/30%/5 PEEP  Intake/Output Summary (Last 24 hours) at 08/03/2023 1750 Last data filed at 08/03/2023 1700 Gross per 24 hour  Intake 4352.22 ml  Output 1150 ml  Net 3202.22 ml  Some leakage at J tube site earlier- resolved Insulin drip at 10- CBG trending down  Consider weaning to extubate tomorrow  Viviann Spare C. Dorris Fetch, MD [facility] [phone]

## 2023-08-04 ENCOUNTER — Inpatient Hospital Stay (HOSPITAL_COMMUNITY)

## 2023-08-04 DIAGNOSIS — E119 Type 2 diabetes mellitus without complications: Secondary | ICD-10-CM | POA: Diagnosis not present

## 2023-08-04 DIAGNOSIS — C155 Malignant neoplasm of lower third of esophagus: Secondary | ICD-10-CM | POA: Diagnosis not present

## 2023-08-04 DIAGNOSIS — N1831 Chronic kidney disease, stage 3a: Secondary | ICD-10-CM

## 2023-08-04 DIAGNOSIS — J9601 Acute respiratory failure with hypoxia: Secondary | ICD-10-CM | POA: Diagnosis not present

## 2023-08-04 DIAGNOSIS — C159 Malignant neoplasm of esophagus, unspecified: Secondary | ICD-10-CM | POA: Diagnosis not present

## 2023-08-04 LAB — POCT I-STAT EG7
Acid-base deficit: 1 mmol/L (ref 0.0–2.0)
Bicarbonate: 22.9 mmol/L (ref 20.0–28.0)
Calcium, Ion: 1.12 mmol/L — ABNORMAL LOW (ref 1.15–1.40)
HCT: 27 % — ABNORMAL LOW (ref 39.0–52.0)
Hemoglobin: 9.2 g/dL — ABNORMAL LOW (ref 13.0–17.0)
O2 Saturation: 63 %
Potassium: 4 mmol/L (ref 3.5–5.1)
Sodium: 144 mmol/L (ref 135–145)
TCO2: 24 mmol/L (ref 22–32)
pCO2, Ven: 35.4 mmHg — ABNORMAL LOW (ref 44–60)
pH, Ven: 7.419 (ref 7.25–7.43)
pO2, Ven: 32 mmHg (ref 32–45)

## 2023-08-04 LAB — BASIC METABOLIC PANEL
Anion gap: 9 (ref 5–15)
BUN: 68 mg/dL — ABNORMAL HIGH (ref 8–23)
CO2: 22 mmol/L (ref 22–32)
Calcium: 8 mg/dL — ABNORMAL LOW (ref 8.9–10.3)
Chloride: 110 mmol/L (ref 98–111)
Creatinine, Ser: 2.25 mg/dL — ABNORMAL HIGH (ref 0.61–1.24)
GFR, Estimated: 31 mL/min — ABNORMAL LOW (ref >60)
Glucose, Bld: 129 mg/dL — ABNORMAL HIGH (ref 70–99)
Potassium: 4.4 mmol/L (ref 3.5–5.1)
Sodium: 141 mmol/L (ref 135–145)

## 2023-08-04 LAB — CBC
HCT: 26.3 % — ABNORMAL LOW (ref 39.0–52.0)
Hemoglobin: 8.4 g/dL — ABNORMAL LOW (ref 13.0–17.0)
MCH: 33.1 pg (ref 26.0–34.0)
MCHC: 31.9 g/dL (ref 30.0–36.0)
MCV: 103.5 fL — ABNORMAL HIGH (ref 80.0–100.0)
Platelets: 235 10*3/uL (ref 150–400)
RBC: 2.54 MIL/uL — ABNORMAL LOW (ref 4.22–5.81)
RDW: 14.6 % (ref 11.5–15.5)
WBC: 8.9 10*3/uL (ref 4.0–10.5)
nRBC: 0 % (ref 0.0–0.2)

## 2023-08-04 LAB — GLUCOSE, CAPILLARY
Glucose-Capillary: 114 mg/dL — ABNORMAL HIGH (ref 70–99)
Glucose-Capillary: 115 mg/dL — ABNORMAL HIGH (ref 70–99)
Glucose-Capillary: 124 mg/dL — ABNORMAL HIGH (ref 70–99)
Glucose-Capillary: 125 mg/dL — ABNORMAL HIGH (ref 70–99)
Glucose-Capillary: 127 mg/dL — ABNORMAL HIGH (ref 70–99)
Glucose-Capillary: 132 mg/dL — ABNORMAL HIGH (ref 70–99)
Glucose-Capillary: 133 mg/dL — ABNORMAL HIGH (ref 70–99)
Glucose-Capillary: 136 mg/dL — ABNORMAL HIGH (ref 70–99)
Glucose-Capillary: 137 mg/dL — ABNORMAL HIGH (ref 70–99)
Glucose-Capillary: 139 mg/dL — ABNORMAL HIGH (ref 70–99)
Glucose-Capillary: 140 mg/dL — ABNORMAL HIGH (ref 70–99)
Glucose-Capillary: 141 mg/dL — ABNORMAL HIGH (ref 70–99)
Glucose-Capillary: 143 mg/dL — ABNORMAL HIGH (ref 70–99)
Glucose-Capillary: 148 mg/dL — ABNORMAL HIGH (ref 70–99)
Glucose-Capillary: 150 mg/dL — ABNORMAL HIGH (ref 70–99)
Glucose-Capillary: 158 mg/dL — ABNORMAL HIGH (ref 70–99)
Glucose-Capillary: 97 mg/dL (ref 70–99)

## 2023-08-04 LAB — CULTURE, RESPIRATORY W GRAM STAIN: Culture: NORMAL

## 2023-08-04 LAB — MRSA NEXT GEN BY PCR, NASAL: MRSA by PCR Next Gen: NOT DETECTED

## 2023-08-04 MED ORDER — FLUCONAZOLE IN SODIUM CHLORIDE 200-0.9 MG/100ML-% IV SOLN
200.0000 mg | Freq: Once | INTRAVENOUS | Status: AC
  Administered 2023-08-04: 200 mg via INTRAVENOUS
  Filled 2023-08-04: qty 100

## 2023-08-04 MED ORDER — ORAL CARE MOUTH RINSE: 15.0000 mL | OROMUCOSAL | Status: DC | PRN

## 2023-08-04 MED ORDER — FLUCONAZOLE 100MG IVPB
100.0000 mg | INTRAVENOUS | Status: DC
  Administered 2023-08-05 – 2023-08-06 (×2): 100 mg via INTRAVENOUS
  Filled 2023-08-04 (×3): qty 50

## 2023-08-04 MED ORDER — GERHARDT'S BUTT CREAM
TOPICAL_CREAM | Freq: Three times a day (TID) | CUTANEOUS | Status: DC
  Administered 2023-08-04 – 2023-08-09 (×4): 1 via TOPICAL
  Filled 2023-08-04 (×3): qty 60

## 2023-08-04 MED ORDER — ORAL CARE MOUTH RINSE
15.0000 mL | OROMUCOSAL | Status: DC
  Administered 2023-08-04 – 2023-08-14 (×20): 15 mL via OROMUCOSAL

## 2023-08-04 NOTE — Plan of Care (Signed)
 Assessment:  Neuro: A/Ox4. Cardiac: NSR, mildly hypertensive. Respiratory: 2L O2 via Taylor, saturation >95%. GI: Tube feeding infusing at goal - 65 mL/hr; NPO; NGT in the left nare to intermittent suction. GU: Indwelling urinary catheter in place d/t urinary retention; adequate UAP. MSK: Moving all four extremities to and from bed-to-chair.   Events: Extubation at approx. 9AM with no complications. Mobility improved - transfers to and from bed to chair. J-tube noted to have significant drainage surrounding insertion site requiring multiple dressing changes this shift.   Plan: Maintain skin integrity. Progress mobility. Wean off of supplemental oxygen.   Problem: Education: Goal: Knowledge of General Education information will improve Description: Including pain rating scale, medication(s)/side effects and non-pharmacologic comfort measures Outcome: Progressing   Problem: Health Behavior/Discharge Planning: Goal: Ability to manage health-related needs will improve Outcome: Progressing   Problem: Clinical Measurements: Goal: Ability to maintain clinical measurements within normal limits will improve Outcome: Progressing Goal: Will remain free from infection Outcome: Progressing Goal: Diagnostic test results will improve Outcome: Progressing Goal: Respiratory complications will improve Outcome: Progressing Goal: Cardiovascular complication will be avoided Outcome: Progressing   Problem: Activity: Goal: Risk for activity intolerance will decrease Outcome: Progressing   Problem: Coping: Goal: Level of anxiety will decrease Outcome: Progressing   Problem: Safety: Goal: Ability to remain free from injury will improve Outcome: Progressing   Problem: Skin Integrity: Goal: Risk for impaired skin integrity will decrease Outcome: Progressing   Problem: Education: Goal: Knowledge of the prescribed therapeutic regimen will improve Outcome: Progressing   Problem: Nutritional: Goal:  Ability to achieve adequate nutritional intake will improve Outcome: Progressing   Problem: Respiratory: Goal: Ability to maintain a clear airway will improve Outcome: Progressing   Problem: Skin Integrity: Goal: Demonstration of wound healing without infection will improve Outcome: Progressing   Problem: Nutrition: Goal: Adequate nutrition will be maintained Outcome: Not Progressing   Problem: Elimination: Goal: Will not experience complications related to bowel motility Outcome: Not Progressing Goal: Will not experience complications related to urinary retention Outcome: Not Progressing   Problem: Pain Managment: Goal: General experience of comfort will improve and/or be controlled Outcome: Not Progressing   Problem: Bowel/Gastric: Goal: Gastrointestinal status for postoperative course will improve Outcome: Not Progressing   Problem: Clinical Measurements: Goal: Postoperative complications will be avoided or minimized Outcome: Not Progressing

## 2023-08-04 NOTE — Progress Notes (Signed)
 eLink Physician-Brief Progress Note Patient Name: DARSHAWN BOATENG DOB: Jan 18, 1956 MRN: [id number]   Date of Service  08/04/2023  HPI/Events of Note  irritation around g-tube  eICU Interventions  Gerhardt's butt cream     Intervention Category Minor Interventions: Routine modifications to care plan (e.g. PRN medications for pain, fever)  Shannara Winbush 08/04/2023, 10:15 PM

## 2023-08-04 NOTE — TOC Progression Note (Signed)
 Transition of Care [facility]) - Progression Note    Patient Details  Name: Seth Burch MRN: [id number] Date of Birth: [birth year]  Transition of Care [facility]) CM/SW Contact  Elliot Cousin, RN Phone Number:[phone] 08/04/2023, 2:52 PM  Clinical Narrative:     TOC CM spoke to pt, wife and dtr at bedside. Gave permission to speak to wife and dtr. Wife states they have ordered a lift chair for pt. He does not have a RW at home. Pt has a cane. CIR following for IP rehab.   Will continue to follow for dc needs.   Expected Discharge Plan: IP Rehab Facility Barriers to Discharge: Continued Medical Work up  Expected Discharge Plan and Services   Discharge Planning Services: CM Consult Post Acute Care Choice: IP Rehab Living arrangements for the past 2 months: Single Family Home                                       Social Determinants of Health (SDOH) Interventions SDOH Screenings   Food Insecurity: No Food Insecurity (07/29/2023)  Housing: Low Risk  (07/29/2023)  Transportation Needs: No Transportation Needs (07/29/2023)  Utilities: Not At Risk (07/29/2023)  Depression (PHQ2-9): Low Risk  (06/12/2023)  Social Connections: Moderately Integrated (07/29/2023)  Tobacco Use: Low Risk  (07/28/2023)    Readmission Risk Interventions     No data to display

## 2023-08-04 NOTE — Plan of Care (Signed)
  Problem: Education: Goal: Knowledge of General Education information will improve Description: Including pain rating scale, medication(s)/side effects and non-pharmacologic comfort measures Outcome: Progressing   Problem: Health Behavior/Discharge Planning: Goal: Ability to manage health-related needs will improve Outcome: Progressing   Problem: Clinical Measurements: Goal: Ability to maintain clinical measurements within normal limits will improve Outcome: Progressing Goal: Will remain free from infection Outcome: Progressing Goal: Diagnostic test results will improve Outcome: Progressing Goal: Respiratory complications will improve Outcome: Progressing Goal: Cardiovascular complication will be avoided Outcome: Progressing   Problem: Nutrition: Goal: Adequate nutrition will be maintained Outcome: Progressing   Problem: Coping: Goal: Level of anxiety will decrease Outcome: Progressing   Problem: Elimination: Goal: Will not experience complications related to bowel motility Outcome: Progressing   Problem: Pain Managment: Goal: General experience of comfort will improve and/or be controlled Outcome: Progressing   Problem: Safety: Goal: Ability to remain free from injury will improve Outcome: Progressing   Problem: Skin Integrity: Goal: Risk for impaired skin integrity will decrease Outcome: Progressing   Problem: Education: Goal: Knowledge of the prescribed therapeutic regimen will improve Outcome: Progressing   Problem: Bowel/Gastric: Goal: Gastrointestinal status for postoperative course will improve Outcome: Progressing   Problem: Nutritional: Goal: Ability to achieve adequate nutritional intake will improve Outcome: Progressing   Problem: Clinical Measurements: Goal: Postoperative complications will be avoided or minimized Outcome: Progressing   Problem: Skin Integrity: Goal: Demonstration of wound healing without infection will improve Outcome:  Progressing

## 2023-08-04 NOTE — Progress Notes (Signed)
      [address]       [place] [address]             [phone]                 7 Days Post-Op Procedure(s) (LRB): XI ROBOTIC ASSISTED IVOR LEWIS ESOPHAGECTOMY (N/A) EGD (ESOPHAGOGASTRODUODENOSCOPY) (N/A)   Events: No events _______________________________________________________________ Vitals: BP (!) 142/72   Pulse 98   Temp 97.9 F (36.6 C) (Oral)   Resp 14   Ht 5\' 10"  (1.778 m)   Wt 87.3 kg   SpO2 96%   BMI 27.62 kg/m  Filed Weights   08/02/23 0500 08/03/23 0500 08/04/23 0405  Weight: 90 kg 87.5 kg 87.3 kg     - Neuro: alert NAd  - Cardiovascular: sinus  Drips: none.      - PulmFarrel Burch, extubated  ABG    Component Value Date/Time   PHART 7.4 07/29/2023 0514   PCO2ART 36 07/29/2023 0514   PO2ART 70 (L) 07/29/2023 0514   HCO3 25.8 08/02/2023 2006   TCO2 24 08/02/2023 1506   ACIDBASEDEF 1.0 08/02/2023 1506   O2SAT 94.5 08/02/2023 2006    - Abd: ND - Extremity: warm  .Intake/Output      03/16 0701 03/17 0700 03/17 0701 03/18 0700   I.V. (mL/kg) 929.8 (10.7) 128.6 (1.5)   Other 590    NG/GT 2760 195   IV Piggyback 387.7 33.2   Total Intake(mL/kg) 4667.5 (53.5) 356.8 (4.1)   Urine (mL/kg/hr) 1085 (0.5) 125 (0.4)   Emesis/NG output 10    Drains 55 20   Chest Tube 30 0   Total Output 1180 145   Net +3487.5 +211.8           _______________________________________________________________ Labs:    Latest Ref Rng & Units 08/04/2023    2:47 AM 08/03/2023    2:19 AM 08/02/2023    3:06 PM  CBC  WBC 4.0 - 10.5 K/uL 8.9  9.4    Hemoglobin 13.0 - 17.0 g/dL 8.4  8.2  9.2   Hematocrit 39.0 - 52.0 % 26.3  25.3  27.0   Platelets 150 - 400 K/uL 235  207        Latest Ref Rng & Units 08/04/2023    2:47 AM 08/03/2023    2:19 AM 08/02/2023    3:06 PM  CMP  Glucose 70 - 99 mg/dL 914  782    BUN 8 - 23 mg/dL 68  53    Creatinine 9.56 - 1.24 mg/dL 2.13  0.86    Sodium 578 - 145 mmol/L 141  141  144   Potassium 3.5 - 5.1 mmol/L 4.4   4.4  4.0   Chloride 98 - 111 mmol/L 110  110    CO2 22 - 32 mmol/L 22  22    Calcium 8.9 - 10.3 mg/dL 8.0  7.9      CXR: stable  _______________________________________________________________  Assessment and Plan: POD 7 s/p MIE  Neuro: pain controlled CV: stable Pulm: IS, ambulation Renal: creat up.  Will continue to follw GI: NPO.  Swallow tomorrow Heme: stable ID: afebrile.  Will likely stop abx Endo: SSI Dispo: continue ICU care   Seth Burch 08/04/2023 10:56 AM

## 2023-08-04 NOTE — Progress Notes (Signed)
 Physical Therapy Treatment Patient Details Name: Seth Burch MRN: [id number] DOB: March 22, 1956 Today's Date: 08/04/2023   History of Present Illness 68 y.o. male presents to [facility] 07/28/23 for esophagectomy and EGD to address distal esophageal adenocarcinoma. Pt with trace R ptx, + atelectasis. 3/13 worsening renal function with transfer to ICU, intubated, extubated on 3/17. PMHx: HTN, DM    PT Comments  Pt with medical decline over the weekend however extubated this morning and tolerated mobility well. Pt able to sit EOB and stand with min/modA. Pt with generalized weakness, impaired balance, decreased activity tolerance, and requires assist for ADLs and mobility. Pt was indep PTA. Continue to recommend inpatient rehab program > 3 hrs a day to achieve maximal functional recovery. Acute PT to cont to follow.    If plan is discharge home, recommend the following: A lot of help with walking and/or transfers;A lot of help with bathing/dressing/bathroom;Assistance with cooking/housework;Assist for transportation;Help with stairs or ramp for entrance   Can travel by private vehicle        Equipment Recommendations  Rolling walker (2 wheels);BSC/3in1;Wheelchair (measurements PT);Wheelchair cushion (measurements PT)    Recommendations for Other Services Rehab consult     Precautions / Restrictions Precautions Precautions: Fall;Other (comment) Precaution/Restrictions Comments: L UQ jejunostomy, chest tube, JP bulb, NG tube, catheter Restrictions Weight Bearing Restrictions Per Provider Order: No     Mobility  Bed Mobility Overal bed mobility: Needs Assistance Bed Mobility: Rolling, Sit to Sidelying, Sidelying to Sit Rolling: Min assist Sidelying to sit: Mod assist     Sit to sidelying: Mod assist General bed mobility comments: max directional verbal cues, increased time, HOB elevated, able to bring LEs off EOB with increased time, modA for trunk elevation and to return LEs back into  bed    Transfers Overall transfer level: Needs assistance Equipment used: 2 person hand held assist (face to face transfer) Transfers: Sit to/from Stand Sit to Stand: Min assist           General transfer comment: minA to power up, wide base of support. completed 3 side steps to [facility], pt mildly shaky    Ambulation/Gait                   Stairs             Wheelchair Mobility     Tilt Bed    Modified Rankin (Stroke Patients Only)       Balance Overall balance assessment: Needs assistance, Mild deficits observed, not formally tested Sitting-balance support: No upper extremity supported, Single extremity supported, Bilateral upper extremity supported, Feet supported Sitting balance-Leahy Scale: Fair Sitting balance - Comments: Largely Fair sitting balance with pt sitting up with unsupported back in recliner for bandage approx. 10 min while RN changed R flank bandages and UB dressing. Intermittent CGA to maintain sitting balance due to fatigue, lethargy, and increased pain with bandage change. Postural control: Right lateral lean, Other (comment) (pt leans to the right and anterior when seated at edge of recliner) Standing balance support: Bilateral upper extremity supported, During functional activity, Reliant on assistive device for balance Standing balance-Leahy Scale: Poor Standing balance comment: reliant on UE support and external support                            Communication Communication Communication: Impaired Factors Affecting Communication: Reduced clarity of speech (soft spoken)  Cognition Arousal: Alert Behavior During Therapy: Flat affect   PT -  Cognitive impairments: No apparent impairments                       PT - Cognition Comments: pt extubated earlier this morning, admitted to not knowing the date but once reoriented pt able to recall Following commands: Intact      Cueing Cueing Techniques: Verbal cues   Exercises General Exercises - Lower Extremity Ankle Circles/Pumps: AROM, Both, 10 reps, Supine Quad Sets: AROM, Both, Supine (2 reps) Long Arc Quad: AROM, Both, 10 reps, Seated    General Comments General comments (skin integrity, edema, etc.): VSS, spouse present      Pertinent Vitals/Pain Pain Assessment Pain Assessment: Faces Faces Pain Scale: Hurts little more Pain Location: nose at site of NG tube; R abdomen/flank Pain Descriptors / Indicators: Aching, Discomfort, Guarding    Home Living                          Prior Function            PT Goals (current goals can now be found in the care plan section) Acute Rehab PT Goals Patient Stated Goal: to get better PT Goal Formulation: With patient/family Time For Goal Achievement: 08/13/23 Potential to Achieve Goals: Good Progress towards PT goals: Progressing toward goals    Frequency    Min 3X/week      PT Plan      Co-evaluation              AM-PAC PT "6 Clicks" Mobility   Outcome Measure  Help needed turning from your back to your side while in a flat bed without using bedrails?: A Lot Help needed moving from lying on your back to sitting on the side of a flat bed without using bedrails?: A Lot Help needed moving to and from a bed to a chair (including a wheelchair)?: A Lot Help needed standing up from a chair using your arms (e.g., wheelchair or bedside chair)?: A Lot Help needed to walk in hospital room?: A Lot Help needed climbing 3-5 steps with a railing? : Total 6 Click Score: 11    End of Session   Activity Tolerance: Patient limited by fatigue;Patient tolerated treatment well Patient left: in bed;with call bell/phone within reach;with family/visitor present;with nursing/sitter in room Nurse Communication: Mobility status PT Visit Diagnosis: Unsteadiness on feet (R26.81);Muscle weakness (generalized) (M62.81);Other abnormalities of gait and mobility (R26.89)     Time:  1129-1150 PT Time Calculation (min) (ACUTE ONLY): 21 min  Charges:    $Therapeutic Activity: 8-22 mins PT General Charges $$ ACUTE PT VISIT: 1 Visit                     Lewis Shock, PT, DPT Acute Rehabilitation Services Secure chat preferred Office #: [phone]    Iona Hansen 08/04/2023, 2:37 PM

## 2023-08-04 NOTE — Progress Notes (Signed)
 Nutrition Follow-up  DOCUMENTATION CODES:   Non-severe (moderate) malnutrition in context of chronic illness  INTERVENTION:   Continue Osmolite 1.5 at 65 ml/hr (TF at goal provides 2340 kcals, 98 g of protein and 1185 mL of free water) Continue free water flush of 200 mL q 4 hours: provides 1200 mL in 24 hours (does not include free water from TF or additional flushes). Total free water 2385 mL in 24 hours Pro-Source TF20 60 mL daily currently for increased needs post-op. Provides additional 20 g of protein  Noted leakage around J-tube site; recommend close monitoring of skin around J-tube site with frequent changing of dressing when saturated to keep skin as dry as possible. Discussed with RN, RN plans to discuss with MD.     NUTRITION DIAGNOSIS:   Moderate Malnutrition related to chronic illness (esophageal cancer s/p chemo, radiation and surgery) as evidenced by percent weight loss, mild fat depletion, moderate muscle depletion.  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  PMH includes DM, HTN, dyslipidemia, CKD, IgG kappa MGUS  3/10 EGD, XI Robotic Assisted Ivor Lewis Esophagectomy, J-tube placement 3/11 Trickle TF initiated 3/12 Surgery increased TF to 40 ml/hr 3/13 Surgery increased TF to 55 ml/hr, transferred to ICU  3/14 Surgery increased TF to 65 ml/hr (goal), bowel regimen added and +BM 3/15 Intubated, Bronch (Thick mucus in trachea suctioned, partially occluding L mainstem, ?aspiration, recurrent false vocal cord  irritation with swelling leading to stridor and resp failure) 3/17 Extubated  NG remains in place, NPO. Noted tentative plan for barium swallow tomorrow Levophed at 1  Osmolite 1.5 infusing at 65 ml/hr via J-tube  J-tube now leaking. RN reports she changed dressing right before PT worked with pt. RD assessed after PT left the room and gauze already soaked with bilious fluid. RN changed dressing again. RD discussed importance of keeping wound as dry as possible with pt and RN. RN plans to discuss leakage with MD.   Pt complains of sore throat, RD noted significant white patch on tongue-looks like thrush. Per RN, pt was started on treatment for thrush this morning. Noted IV diflucan ordered. This is likely contributing to sore throat, although NGT could also be causing some irritation as well  Weight down to 87.3 kg  Labs:  BUN 68 (H) Creatinine 2.25 (H) Sodium 141 (wdl)  Meds:  Insulin gtt Levophed   Diet Order:   Diet Order             Diet NPO time specified  Diet effective now                   EDUCATION NEEDS:   Education needs have been addressed  Skin:  Skin Assessment: Skin Integrity Issues: Skin Integrity Issues:: Other (Comment) Incisions: new chest tube, new J-tube (07/28/2022) Other: Leaking around J-tube  Last BM:  3/17  Height:   Ht Readings from Last 1 Encounters:  07/28/23 5\' 10"  (1.778 m)    Weight:   Wt Readings from Last 1 Encounters:  08/04/23 87.3 kg  BMI:  Body mass index is 27.62 kg/m.  Estimated Nutritional Needs:   Kcal:  2200-2400 kcals  Protein:  115-130 g  Fluid:  >/= 2L   Romelle Starcher MS, RDN, LDN, CNSC Registered Dietitian 3 Clinical Nutrition RD Inpatient Contact Info in Amion

## 2023-08-04 NOTE — Procedures (Signed)
 Extubation Procedure Note  Patient Details:   Name: Seth Burch DOB: 04-15-1956 MRN: [id number]   Airway Documentation:    Vent end date: 08/04/23 Vent end time: 0930   Evaluation  O2 sats: stable throughout Complications: No apparent complications Patient did tolerate procedure well. Bilateral Breath Sounds: Clear, Diminished   Yes  RT extubated patient per MD order. Cuff leak present. Extubated to 3L nasal canula humidified. Able to pull about 800 on IS.  Elam Dutch 08/04/2023, 9:37 AM

## 2023-08-04 NOTE — Progress Notes (Signed)
 Inpatient Rehab Admissions Coordinator:   Continuing to follow for potential CIR admission. Patient intubated over weekend and extubated this am. Will await medical readiness,  activity tolerance to begin insurance authorization.   Rehab Admissons Coordinator [place], [place], [place] [phone]

## 2023-08-04 NOTE — Progress Notes (Signed)
 NAME:  Seth Burch, MRN:  [id number], DOB:  10-02-1955, LOS: 7 ADMISSION DATE:  07/28/2023, CONSULTATION DATE:  3/13\ REFERRING MD:  Cliffton Asters, CHIEF COMPLAINT:  s/p esophagectomy, renal failure    History of Present Illness:  68 year old male with past medical history of diabetes, dyslipidemia, hypertension, CKD, distal esophageal carcinoma and progressive dysphagia s/p EUS and neoadjuvant chemoradiation who presented to [facility] for esophagectomy.  On 3/10 he had Ivor-Lewis esophagectomy, pyloromyotomy, laparoscopic jejunostomy tube placement.  On 3/11 he was started on gentle tube feeds.  Having urinary retention with subsequent Foley catheter placement.  Overnight into 3/12 had difficulty with pain control.  On day of consultation, patient noted to be somnolent, difficulty with taking deep breaths and coughing.  He has had progressive worsening renal function with creatinine up to 2.49.  He has had low-grade fevers which is not likely to be due to respiratory atelectasis.  He was transferred to ICU for closer monitoring and CCM was consulted to help with medical management.  Pertinent  Medical History  diabetes, dyslipidemia, hypertension, CKD, distal esophageal carcinoma and progressive dysphagia s/p EUS and neoadjuvant chemoradiation  Significant Hospital Events: Including procedures, antibiotic start and stop dates in addition to other pertinent events   3/10: Esophagectomy, pyloromyotomy, jejunostomy placement 3/11: Gentle tube feeds started, urinary retention, increasing creatinine 3/12: Difficulty with pain control 3/13: Worsening renal function, low-grade fevers, not up and ambulating, transferred to ICU 3/15 sudden onset stridor refractory to conservative measures requiring intubation: marked false cord swelling noted  Interim History / Subjective:  Afebrile On 1 mic of Levophed in the setting of deep sedation Tolerating spontaneous breathing trial  Objective   Blood pressure  131/68, pulse 70, temperature 98.2 F (36.8 C), temperature source Oral, resp. rate (!) 23, height 5\' 10"  (1.778 m), weight 87.3 kg, SpO2 96%.    Vent Mode: PRVC FiO2 (%):  [30 %-50 %] 30 % Set Rate:  [22 bmp] 22 bmp Vt Set:  [580 mL] 580 mL PEEP:  [5 cmH20] 5 cmH20 Plateau Pressure:  [13 cmH20-20 cmH20] 13 cmH20   Intake/Output Summary (Last 24 hours) at 08/04/2023 0801 Last data filed at 08/04/2023 0600 Gross per 24 hour  Intake 4395.77 ml  Output 1100 ml  Net 3295.77 ml   Filed Weights   08/02/23 0500 08/03/23 0500 08/04/23 0405  Weight: 90 kg 87.5 kg 87.3 kg    Examination: General: Crtitically ill-appearing male, orally intubated HEENT: [place]/AT, eyes anicteric.  ETT and OGT in place Neuro: Sedated, not following commands.  Eyes are closed.  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 Diabetes w/ hyperglycemia worsened by dexamethasone Acute respiratory failure secondary to vocal cord swelling and likely aspiration PNA s/p intubation and therapeutic bronch 3/15 evening Sedation associated hypotension  [facility] following On TF Serum Cr is is plateauing, currently at 2.2 Made 1.08 L of urine in last 24 hours Monitor intake and output Avoid nephrotoxic agent Currently on insulin infusion, dexamethasone was stopped after finishing course Will continue insulin infusion for now, adjust once blood sugar is better controlled With a GlideScope vocal cords were seen, does not look like swollen Will give him spontaneous breathing trial  and try to extubate him Currently on vancomycin and Zosyn Respiratory culture is  negative Will do MRSA screen if negative will stop vancomycin He is on 1 mic of Levophed likely in the setting of sedation with propofol and fentanyl Continue lung protective ventilation VAP prevention bundle in place   Best Practice (right click and "Reselect all SmartList Selections" daily)   Diet/type: tubefeeds and NPO DVT prophylaxis: LMWH Pressure ulcer(s): na GI prophylaxis: N/A Lines: N/A Foley:  Yes, and it is still needed Code Status:  full code Last date of multidisciplinary goals of care discussion: 3/17: Patient's wife was updated at bedside, decision was to continue full scope of care  The patient is critically ill due to acute respiratory failure with hypoxia/esophageal cancer status post esophagectomy.  Critical care was necessary to treat or prevent imminent or life-threatening deterioration.  Critical care was time spent personally by me on the following activities: development of treatment plan with patient and/or surrogate as well as nursing, discussions with consultants, evaluation of patient's response to treatment, examination of patient, obtaining history from patient or surrogate, ordering and performing treatments and interventions, ordering and review of laboratory studies, ordering and review of radiographic studies, pulse oximetry, re-evaluation of patient's condition and participation in multidisciplinary rounds.   During this encounter critical care time was devoted to patient care services described in this note for 38 minutes.     Cheri Fowler, MD [place] [facility] See Amion for pager If no response to pager, please call [phone] until 7pm After 7pm, Please call E-link [phone]

## 2023-08-05 ENCOUNTER — Inpatient Hospital Stay (HOSPITAL_COMMUNITY)

## 2023-08-05 DIAGNOSIS — C159 Malignant neoplasm of esophagus, unspecified: Secondary | ICD-10-CM | POA: Diagnosis not present

## 2023-08-05 DIAGNOSIS — E119 Type 2 diabetes mellitus without complications: Secondary | ICD-10-CM | POA: Diagnosis not present

## 2023-08-05 DIAGNOSIS — J9601 Acute respiratory failure with hypoxia: Secondary | ICD-10-CM | POA: Diagnosis not present

## 2023-08-05 DIAGNOSIS — C155 Malignant neoplasm of lower third of esophagus: Secondary | ICD-10-CM | POA: Diagnosis not present

## 2023-08-05 LAB — BASIC METABOLIC PANEL
Anion gap: 9 (ref 5–15)
BUN: 68 mg/dL — ABNORMAL HIGH (ref 8–23)
CO2: 24 mmol/L (ref 22–32)
Calcium: 8.1 mg/dL — ABNORMAL LOW (ref 8.9–10.3)
Chloride: 111 mmol/L (ref 98–111)
Creatinine, Ser: 2.2 mg/dL — ABNORMAL HIGH (ref 0.61–1.24)
GFR, Estimated: 32 mL/min — ABNORMAL LOW (ref >60)
Glucose, Bld: 142 mg/dL — ABNORMAL HIGH (ref 70–99)
Potassium: 4.6 mmol/L (ref 3.5–5.1)
Sodium: 144 mmol/L (ref 135–145)

## 2023-08-05 LAB — GLUCOSE, CAPILLARY
Glucose-Capillary: 104 mg/dL — ABNORMAL HIGH (ref 70–99)
Glucose-Capillary: 108 mg/dL — ABNORMAL HIGH (ref 70–99)
Glucose-Capillary: 111 mg/dL — ABNORMAL HIGH (ref 70–99)
Glucose-Capillary: 116 mg/dL — ABNORMAL HIGH (ref 70–99)
Glucose-Capillary: 119 mg/dL — ABNORMAL HIGH (ref 70–99)
Glucose-Capillary: 125 mg/dL — ABNORMAL HIGH (ref 70–99)
Glucose-Capillary: 132 mg/dL — ABNORMAL HIGH (ref 70–99)
Glucose-Capillary: 132 mg/dL — ABNORMAL HIGH (ref 70–99)
Glucose-Capillary: 136 mg/dL — ABNORMAL HIGH (ref 70–99)
Glucose-Capillary: 142 mg/dL — ABNORMAL HIGH (ref 70–99)
Glucose-Capillary: 144 mg/dL — ABNORMAL HIGH (ref 70–99)
Glucose-Capillary: 145 mg/dL — ABNORMAL HIGH (ref 70–99)
Glucose-Capillary: 148 mg/dL — ABNORMAL HIGH (ref 70–99)
Glucose-Capillary: 148 mg/dL — ABNORMAL HIGH (ref 70–99)
Glucose-Capillary: 178 mg/dL — ABNORMAL HIGH (ref 70–99)
Glucose-Capillary: 95 mg/dL (ref 70–99)
Glucose-Capillary: 99 mg/dL (ref 70–99)

## 2023-08-05 LAB — CBC WITH DIFFERENTIAL/PLATELET
Abs Immature Granulocytes: 0.31 10*3/uL — ABNORMAL HIGH (ref 0.00–0.07)
Basophils Absolute: 0 10*3/uL (ref 0.0–0.1)
Basophils Relative: 0 %
Eosinophils Absolute: 0 10*3/uL (ref 0.0–0.5)
Eosinophils Relative: 0 %
HCT: 32 % — ABNORMAL LOW (ref 39.0–52.0)
Hemoglobin: 10 g/dL — ABNORMAL LOW (ref 13.0–17.0)
Immature Granulocytes: 2 %
Lymphocytes Relative: 8 %
Lymphs Abs: 1.1 10*3/uL (ref 0.7–4.0)
MCH: 32.3 pg (ref 26.0–34.0)
MCHC: 31.3 g/dL (ref 30.0–36.0)
MCV: 103.2 fL — ABNORMAL HIGH (ref 80.0–100.0)
Monocytes Absolute: 1.5 10*3/uL — ABNORMAL HIGH (ref 0.1–1.0)
Monocytes Relative: 11 %
Neutro Abs: 11.4 10*3/uL — ABNORMAL HIGH (ref 1.7–7.7)
Neutrophils Relative %: 79 %
Platelets: 390 10*3/uL (ref 150–400)
RBC: 3.1 MIL/uL — ABNORMAL LOW (ref 4.22–5.81)
RDW: 14.9 % (ref 11.5–15.5)
WBC: 14.4 10*3/uL — ABNORMAL HIGH (ref 4.0–10.5)
nRBC: 0 % (ref 0.0–0.2)

## 2023-08-05 LAB — MAGNESIUM: Magnesium: 2.5 mg/dL — ABNORMAL HIGH (ref 1.7–2.4)

## 2023-08-05 LAB — PHOSPHORUS: Phosphorus: 2.8 mg/dL (ref 2.5–4.6)

## 2023-08-05 MED ORDER — IOHEXOL 300 MG/ML  SOLN
50.0000 mL | Freq: Once | INTRAMUSCULAR | Status: DC | PRN
Start: 2023-08-05 — End: 2023-08-05

## 2023-08-05 MED ORDER — IOHEXOL 300 MG/ML  SOLN
100.0000 mL | Freq: Once | INTRAMUSCULAR | Status: AC | PRN
  Administered 2023-08-05: 30 mL via ORAL

## 2023-08-05 NOTE — Progress Notes (Signed)
      [address]       [place] [address]             [phone]                 8 Days Post-Op Procedure(s) (LRB): XI ROBOTIC ASSISTED IVOR LEWIS ESOPHAGECTOMY (N/A) EGD (ESOPHAGOGASTRODUODENOSCOPY) (N/A)   Events: No events _______________________________________________________________ Vitals: BP (!) 151/78   Pulse (!) 104   Temp (!) 97.5 F (36.4 C) (Oral)   Resp (!) 23   Ht 5\' 10"  (1.778 m)   Wt 88.3 kg   SpO2 93%   BMI 27.93 kg/m  Filed Weights   08/03/23 0500 08/04/23 0405 08/05/23 0457  Weight: 87.5 kg 87.3 kg 88.3 kg     - Neuro: alert NAD  - Cardiovascular: sinus  Drips: none.      - PulmFarrel Conners, SS CT output  ABG    Component Value Date/Time   PHART 7.4 07/29/2023 0514   PCO2ART 36 07/29/2023 0514   PO2ART 70 (L) 07/29/2023 0514   HCO3 25.8 08/02/2023 2006   TCO2 24 08/02/2023 1506   ACIDBASEDEF 1.0 08/02/2023 1506   O2SAT 94.5 08/02/2023 2006    - Abd: ND - Extremity: warm  .Intake/Output      03/17 0701 03/18 0700 03/18 0701 03/19 0700   I.V. (mL/kg) 221.3 (2.5) 4.1 (0)   Other 680    NG/GT 1560 69.3   IV Piggyback 250.4 13.3   Total Intake(mL/kg) 2711.8 (30.7) 86.7 (1)   Urine (mL/kg/hr) 2370 (1.1)    Emesis/NG output 50    Drains 80    Stool 1    Chest Tube 130    Total Output 2631    Net +80.8 +86.7        Stool Occurrence 0 x       _______________________________________________________________ Labs:    Latest Ref Rng & Units 08/04/2023    2:47 AM 08/03/2023    2:19 AM 08/02/2023    3:06 PM  CBC  WBC 4.0 - 10.5 K/uL 8.9  9.4    Hemoglobin 13.0 - 17.0 g/dL 8.4  8.2  9.2   Hematocrit 39.0 - 52.0 % 26.3  25.3  27.0   Platelets 150 - 400 K/uL 235  207        Latest Ref Rng & Units 08/04/2023    2:47 AM 08/03/2023    2:19 AM 08/02/2023    3:06 PM  CMP  Glucose 70 - 99 mg/dL 595  638    BUN 8 - 23 mg/dL 68  53    Creatinine 7.56 - 1.24 mg/dL 4.33  2.95    Sodium 188 - 145 mmol/L 141  141  144    Potassium 3.5 - 5.1 mmol/L 4.4  4.4  4.0   Chloride 98 - 111 mmol/L 110  110    CO2 22 - 32 mmol/L 22  22    Calcium 8.9 - 10.3 mg/dL 8.0  7.9      CXR: stable  _______________________________________________________________  Assessment and Plan: POD 8 s/p MIE  Neuro: pain controlled CV: stable Pulm: IS, ambulation Renal: creat pending GI: NPO.  Swallow today Heme: stable ID: afebrile.  Will likely stop abx Endo: SSI Dispo: continue ICU care   Ardythe Klute O Ranny Wiebelhaus 08/05/2023 8:05 AM

## 2023-08-05 NOTE — Progress Notes (Signed)
 Inpatient Rehab Admissions Coordinator:  Saw pt and wife at bedside. Will continue to monitor progress with therapies and await medical readiness prior to beginning insurance authorization.   Wolfgang Phoenix, MS, CCC-SLP Admissions Coordinator [phone]

## 2023-08-05 NOTE — Progress Notes (Signed)
 Occupational Therapy Treatment Patient Details Name: Seth Burch MRN: [id number] DOB: 01/30/1956 Today's Date: 08/05/2023   History of present illness 68 y.o. male presents to [facility] 07/28/23 for esophagectomy and EGD to address distal esophageal adenocarcinoma. Pt with trace R ptx, + atelectasis. 3/13 worsening renal function with transfer to ICU, intubated, extubated on 3/17. PMHx: HTN, DM   OT comments  Pt making steady progress towards OT goals. Session focused on [facility] transfers, toileting tasks and transfer back to bed at end of session. Overall, pt requires Min-Mod A x 2 for transfers using RW, Total A for LB ADLs completed in standing. Wife present and supportive. Continue to recommend intensive rehab services at [place].      If plan is discharge home, recommend the following:  A lot of help with walking and/or transfers;A lot of help with bathing/dressing/bathroom   Equipment Recommendations  BSC/3in1;Other (comment) (TBD)    Recommendations for Other Services Rehab consult    Precautions / Restrictions Precautions Precautions: Fall;Other (comment) Precaution/Restrictions Comments: L UQ jejunostomy, R chest tube, JP bulb Restrictions Weight Bearing Restrictions Per Provider Order: No       Mobility Bed Mobility Overal bed mobility: Needs Assistance Bed Mobility: Rolling, Sit to Sidelying Rolling: Contact guard assist       Sit to sidelying: Mod assist General bed mobility comments: assist for BLE back into bed with pt able to assist in controlling trunk to  bed    Transfers Overall transfer level: Needs assistance Equipment used: Rolling walker (2 wheels) Transfers: Sit to/from Stand, Bed to chair/wheelchair/BSC Sit to Stand: Mod assist, +2 safety/equipment     Step pivot transfers: Min assist, +2 safety/equipment     General transfer comment: Mod A x 2 for safety to stand from recliner, Min A x 2 to stand from [facility] w/ cues to push from armrest. Once up, Min A x 2  for taking steps to [facility] and then back to bed. cues for sequencing and assist with DME/line mgmt     Balance Overall balance assessment: Needs assistance, Mild deficits observed, not formally tested Sitting-balance support: No upper extremity supported, Single extremity supported, Bilateral upper extremity supported, Feet supported Sitting balance-Leahy Scale: Fair     Standing balance support: Bilateral upper extremity supported, During functional activity, Reliant on assistive device for balance Standing balance-Leahy Scale: Poor                             ADL either performed or assessed with clinical judgement   ADL Overall ADL's : Needs assistance/impaired                 Upper Body Dressing : Minimal assistance;Sitting Upper Body Dressing Details (indicate cue type and reason): assistance primarily needed for line mgmt     Toilet Transfer: Minimal assistance;Moderate assistance;+2 for safety/equipment;Stand-pivot Toilet Transfer Details (indicate cue type and reason): Mod A + 2 for safety to stand, once up, able to pivot with Min A x 2 to/from [facility] using RW Toileting- Clothing Manipulation and Hygiene: Total assistance;Sit to/from stand Toileting - Clothing Manipulation Details (indicate cue type and reason): Total A for peri care in standing- unable to reach while sitting on [facility]            Extremity/Trunk Assessment Upper Extremity Assessment Upper Extremity Assessment: Generalized weakness;Right hand dominant   Lower Extremity Assessment Lower Extremity Assessment: Defer to PT evaluation        Vision  Vision Assessment?: No apparent visual deficits   Perception     Praxis     Communication Communication Communication: No apparent difficulties   Cognition Arousal: Alert Behavior During Therapy: Flat affect, WFL for tasks assessed/performed Cognition: No apparent impairments             OT - Cognition Comments: Pt oriented x4 and  pleasant throughout session with cognition WNL for tasks assessed.                 Following commands: Intact        Cueing   Cueing Techniques: Verbal cues  Exercises      Shoulder Instructions       General Comments VSS on 1-2 LO2. drainage noted from G tube site - RN aware and at bedside to address.    Pertinent Vitals/ Pain       Pain Assessment Pain Assessment: No/denies pain Pain Intervention(s): Monitored during session  Home Living                                          Prior Functioning/Environment              Frequency  Min 1X/week        Progress Toward Goals  OT Goals(current goals can now be found in the care plan section)  Progress towards OT goals: Progressing toward goals  Acute Rehab OT Goals Patient Stated Goal: use the bathroom OT Goal Formulation: With patient/family Time For Goal Achievement: 08/14/23 Potential to Achieve Goals: Good ADL Goals Pt Will Perform Grooming: with contact guard assist;sitting Pt Will Perform Lower Body Bathing: with min assist;sitting/lateral leans;sit to/from stand Pt Will Perform Upper Body Dressing: with contact guard assist;sitting Pt Will Perform Lower Body Dressing: with min assist;sitting/lateral leans;sit to/from stand Pt Will Transfer to Toilet: with contact guard assist;ambulating;bedside commode Pt Will Perform Toileting - Clothing Manipulation and hygiene: with min assist;sitting/lateral leans;sit to/from stand  Plan      Co-evaluation                 AM-PAC OT "6 Clicks" Daily Activity     Outcome Measure   Help from another person eating meals?: Total Help from another person taking care of personal grooming?: A Little Help from another person toileting, which includes using toliet, bedpan, or urinal?: Total Help from another person bathing (including washing, rinsing, drying)?: A Lot Help from another person to put on and taking off regular upper body  clothing?: A Little Help from another person to put on and taking off regular lower body clothing?: Total 6 Click Score: 11    End of Session Equipment Utilized During Treatment: Rolling walker (2 wheels);Oxygen  OT Visit Diagnosis: Unsteadiness on feet (R26.81);Other abnormalities of gait and mobility (R26.89);Muscle weakness (generalized) (M62.81);Other (comment)   Activity Tolerance Patient tolerated treatment well   Patient Left in bed;with call bell/phone within reach;with family/visitor present   Nurse Communication Mobility status;Other (comment) (drainage)        Time: 2831-5176 OT Time Calculation (min): 33 min  Charges: OT General Charges $OT Visit: 1 Visit OT Treatments $Self Care/Home Management : 23-37 mins  Bradd Canary, OTR/L Acute Rehab Services Office: [phone]   Lorre Munroe 08/05/2023, 12:08 PM

## 2023-08-05 NOTE — Progress Notes (Signed)
 NAME:  Seth Burch, MRN:  [id number], DOB:  [birth year], LOS: 8 ADMISSION DATE:  07/28/2023, CONSULTATION DATE:  3/13\ REFERRING MD:  Cliffton Asters, CHIEF COMPLAINT:  s/p esophagectomy, renal failure    History of Present Illness:  68 year old male with past medical history of diabetes, dyslipidemia, hypertension, CKD, distal esophageal carcinoma and progressive dysphagia s/p EUS and neoadjuvant chemoradiation who presented to [facility] for esophagectomy.  On 3/10 he had Ivor-Lewis esophagectomy, pyloromyotomy, laparoscopic jejunostomy tube placement.  On 3/11 he was started on gentle tube feeds.  Having urinary retention with subsequent Foley catheter placement.  Overnight into 3/12 had difficulty with pain control.  On day of consultation, patient noted to be somnolent, difficulty with taking deep breaths and coughing.  He has had progressive worsening renal function with creatinine up to 2.49.  He has had low-grade fevers which is not likely to be due to respiratory atelectasis.  He was transferred to ICU for closer monitoring and CCM was consulted to help with medical management.  Pertinent  Medical History  diabetes, dyslipidemia, hypertension, CKD, distal esophageal carcinoma and progressive dysphagia s/p EUS and neoadjuvant chemoradiation  Significant Hospital Events: Including procedures, antibiotic start and stop dates in addition to other pertinent events   3/10: Esophagectomy, pyloromyotomy, jejunostomy placement 3/11: Gentle tube feeds started, urinary retention, increasing creatinine 3/12: Difficulty with pain control 3/13: Worsening renal function, low-grade fevers, not up and ambulating, transferred to ICU 3/15 sudden onset stridor refractory to conservative measures requiring intubation: marked false cord swelling noted  Interim History / Subjective:  Patient was successfully extubated yesterday No overnight issues  Remain afebrile  Objective   Blood pressure (!) 151/78, pulse (!)  104, temperature 98.5 F (36.9 C), temperature source Axillary, resp. rate (!) 23, height 5\' 10"  (1.778 m), weight 88.3 kg, SpO2 95%.    Vent Mode: PSV;CPAP FiO2 (%):  [30 %] 30 % PEEP:  [5 cmH20] 5 cmH20 Pressure Support:  [8 cmH20] 8 cmH20   Intake/Output Summary (Last 24 hours) at 08/05/2023 0831 Last data filed at 08/05/2023 2130 Gross per 24 hour  Intake 2567.31 ml  Output 2486 ml  Net 81.31 ml   Filed Weights   08/03/23 0500 08/04/23 0405 08/05/23 0457  Weight: 87.5 kg 87.3 kg 88.3 kg    Examination: General: Elderly male, sitting on recliner HEENT: [place]/AT, eyes anicteric.  moist mucus membranes Neuro: Alert, awake following commands Chest: Coarse breath sounds, no wheezes or rhonchi. 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 Diabetes w/ hyperglycemia worsened by dexamethasone Acute respiratory failure secondary to vocal cord swelling and likely aspiration PNA s/p intubation and therapeutic bronch 3/15 evening Oral thrush  TCTS following On TF Patient is going for swallow evaluation today Serum creatinine remained stable around 2.2 Made 2.4 L of urine in last 24 hours Monitor intake and output Avoid nephrotoxic agent Still on insulin infusion though dose has decreased, will try to switch to long-acting and sliding scale insulin once swallow evaluation is done Patient was successfully extubated yesterday Remain on 3 L oxygen, titrate with O2 sat goal 92% Completed antibiotic therapy Continue Diflucan and Magic mouthwash   Best Practice (right click  and "Reselect all SmartList Selections" daily)    Diet/type: tubefeeds and NPO DVT prophylaxis: LMWH Pressure ulcer(s): na GI prophylaxis: N/A Lines: N/A Foley:  Yes, and it is still needed Code Status:  full code Last date of multidisciplinary goals of care discussion: 3/18: Patient and his wife were updated at bedside, decision was to continue full scope of care    Cheri Fowler, MD [place] [facility] See Amion for pager If no response to pager, please call [phone] until 7pm After 7pm, Please call E-link [phone]

## 2023-08-05 NOTE — Progress Notes (Signed)
 Pharmacy Antibiotic Note  Seth Burch is a 68 y.o. male admitted on 07/28/2023 with esophagectomy. Pt now with worsening respiratory status requiring intubation. Pharmacy has been consulted for Zosyn dosing to cover PNA.   Currently on day #4 of zosyn. MRSA PCR neg so vancomycin stopped yesterday. Started on fluconazole yesterday for thrush. WBC trending up to 14, afebrile. Scr 2.2 (CrCl 37 mL/min).   Plan: Continue zosyn 3.375g IV EI q8h for 7 days total Continue fluconazole 100 mg IV every 24 hours  Monitor renal fx, cx results, clinical pic, LOT  Height: 5\' 10"  (177.8 cm) Weight: 88.3 kg (194 lb 10.7 oz) IBW/kg (Calculated) : 73  Temp (24hrs), Avg:97.8 F (36.6 C), Min:97.1 F (36.2 C), Max:98.5 F (36.9 C)  Recent Labs  Lab 07/31/23 0500 08/01/23 0401 08/02/23 0458 08/03/23 0219 08/04/23 0247 08/05/23 0841  WBC 10.4 8.6 9.2 9.4 8.9 14.4*  CREATININE 2.49* 2.29* 2.08* 2.21* 2.25*  --     Estimated Creatinine Clearance: 36.1 mL/min (A) (by C-G formula based on SCr of 2.25 mg/dL (H)).    No Known Allergies  Thank you for allowing pharmacy to participate in this patient's care,  Sherron Monday, PharmD, BCCCP Clinical Pharmacist  Phone: [phone] 08/05/2023 9:27 AM  Please check AMION for all [facility] Pharmacy phone numbers After 10:00 PM, call Main Pharmacy [phone]

## 2023-08-06 ENCOUNTER — Inpatient Hospital Stay (HOSPITAL_COMMUNITY)

## 2023-08-06 DIAGNOSIS — E1165 Type 2 diabetes mellitus with hyperglycemia: Secondary | ICD-10-CM | POA: Diagnosis not present

## 2023-08-06 DIAGNOSIS — Z794 Long term (current) use of insulin: Secondary | ICD-10-CM | POA: Diagnosis not present

## 2023-08-06 DIAGNOSIS — E870 Hyperosmolality and hypernatremia: Secondary | ICD-10-CM | POA: Diagnosis not present

## 2023-08-06 DIAGNOSIS — C159 Malignant neoplasm of esophagus, unspecified: Secondary | ICD-10-CM | POA: Diagnosis not present

## 2023-08-06 LAB — GLUCOSE, CAPILLARY
Glucose-Capillary: 104 mg/dL — ABNORMAL HIGH (ref 70–99)
Glucose-Capillary: 114 mg/dL — ABNORMAL HIGH (ref 70–99)
Glucose-Capillary: 133 mg/dL — ABNORMAL HIGH (ref 70–99)
Glucose-Capillary: 142 mg/dL — ABNORMAL HIGH (ref 70–99)
Glucose-Capillary: 148 mg/dL — ABNORMAL HIGH (ref 70–99)
Glucose-Capillary: 153 mg/dL — ABNORMAL HIGH (ref 70–99)
Glucose-Capillary: 171 mg/dL — ABNORMAL HIGH (ref 70–99)
Glucose-Capillary: 213 mg/dL — ABNORMAL HIGH (ref 70–99)
Glucose-Capillary: 247 mg/dL — ABNORMAL HIGH (ref 70–99)
Glucose-Capillary: 260 mg/dL — ABNORMAL HIGH (ref 70–99)
Glucose-Capillary: 287 mg/dL — ABNORMAL HIGH (ref 70–99)
Glucose-Capillary: 90 mg/dL (ref 70–99)

## 2023-08-06 LAB — BASIC METABOLIC PANEL
Anion gap: 10 (ref 5–15)
BUN: 58 mg/dL — ABNORMAL HIGH (ref 8–23)
CO2: 25 mmol/L (ref 22–32)
Calcium: 8.1 mg/dL — ABNORMAL LOW (ref 8.9–10.3)
Chloride: 111 mmol/L (ref 98–111)
Creatinine, Ser: 2.07 mg/dL — ABNORMAL HIGH (ref 0.61–1.24)
GFR, Estimated: 35 mL/min — ABNORMAL LOW (ref >60)
Glucose, Bld: 159 mg/dL — ABNORMAL HIGH (ref 70–99)
Potassium: 4.4 mmol/L (ref 3.5–5.1)
Sodium: 146 mmol/L — ABNORMAL HIGH (ref 135–145)

## 2023-08-06 MED ORDER — DOCUSATE SODIUM 50 MG/5ML PO LIQD: 100.0000 mg | Freq: Two times a day (BID) | ORAL | Status: DC | PRN

## 2023-08-06 MED ORDER — INSULIN ASPART 100 UNIT/ML IJ SOLN
0.0000 [IU] | INTRAMUSCULAR | Status: DC
  Administered 2023-08-06: 8 [IU] via SUBCUTANEOUS
  Administered 2023-08-07 (×2): 5 [IU] via SUBCUTANEOUS
  Administered 2023-08-07: 3 [IU] via SUBCUTANEOUS

## 2023-08-06 MED ORDER — PIPERACILLIN-TAZOBACTAM 3.375 G IVPB
3.3750 g | Freq: Three times a day (TID) | INTRAVENOUS | Status: AC
  Administered 2023-08-06 – 2023-08-07 (×4): 3.375 g via INTRAVENOUS
  Filled 2023-08-06 (×4): qty 50

## 2023-08-06 MED ORDER — INSULIN ASPART 100 UNIT/ML IJ SOLN
0.0000 [IU] | Freq: Three times a day (TID) | INTRAMUSCULAR | Status: DC
  Administered 2023-08-06: 2 [IU] via SUBCUTANEOUS

## 2023-08-06 MED ORDER — INSULIN ASPART 100 UNIT/ML IJ SOLN: 0.0000 [IU] | Freq: Every day | INTRAMUSCULAR | Status: DC

## 2023-08-06 MED ORDER — INSULIN GLARGINE 100 UNIT/ML ~~LOC~~ SOLN
10.0000 [IU] | Freq: Every day | SUBCUTANEOUS | Status: DC
  Administered 2023-08-06 – 2023-08-07 (×2): 10 [IU] via SUBCUTANEOUS
  Filled 2023-08-06 (×2): qty 0.1

## 2023-08-06 MED ORDER — POLYETHYLENE GLYCOL 3350 17 G PO PACK: 17.0000 g | PACK | Freq: Every day | ORAL | Status: DC | PRN

## 2023-08-06 NOTE — Procedures (Signed)
 Modified Barium Swallow Study  Patient Details  Name: Seth Burch MRN: [id number] Date of Birth: August 22, 1955  Today's Date: 08/06/2023  Modified Barium Swallow completed.  Full report located under Chart Review in the Imaging Section.  History of Present Illness Patient is a 68 y.o. male with PMH: HTN, DM. He presented to [facility] on 07/28/23 for esophagectomy, pyloromyotomy, jejunostomy placed and gentle tube feedings started on 3/11. Patient with worsening renal function, low grade fevers and transferred to ICU. On 3/15 he had sudden onset of stridor and required intubation; marked false cord swelling noted. Bronchoscopy performed on 3/15 which reported thick mucus in trachea partially occluding left mainstem, suspicion or aspiration and mucus plugging with recurrent false vocal cord irritation leading to swelling, stridor, respiratory failure. He was extubated on 3/17. On 3/18, barium esophagram was completed to evaluate for an anastomotic leak and aspiration of barium liquid was reported, prompting SLP swallow evaluation order. SLP recommending MBS.   Clinical Impression Patient presents with an pharyngoesophageal dysphagia as per this MBS. Swallow was initiated at the vallecular sinus with all consistencies of barium. (thin, nectar and honey thick, puree, solid) With head in neutral position, patient with deep penetration of trace amount of thin liquid barium which reached as far as passing below vocal cords (PAS 6) before ejecting out. More shallow penetration appeared to remain in laryngeal vestibule (PAS 3). Frequency, depth and amount of penetration was significantly reduced when patient utilized chin tuck posture with thin liquids. Vallecular residuals were observed with all consistencies but amount increased with increased viscocity and density of bolus. Secondary, dry swallows did aid in clearing some of the vallecular residuals. With mechanical soft solids, patient with moderate amount of  barium in vallecular sinus, and mild amount of residuals along posterior pharyngeal wall and at and below PES. Subsequent swallows as well as swallows of liquids did aid in reducing amount of residuals. SLP suspects pharyngeal edema is impacting patient's ability to clear residuals. Patient demonstrated understanding of and consistent use of chin tuck posture when drinking liquids. SLP is recommending to initiate PO diet of Dys 2 (minced) solids, thin liquids. Factors that may increase risk of adverse event in presence of aspiration Rubye Oaks [employer] Clearance Coots 2021): Poor general health and/or compromised immunity  Swallow Evaluation Recommendations Recommendations: PO diet PO Diet Recommendation: Dysphagia 2 (Finely chopped);Thin liquids (Level 0) Liquid Administration via: Cup;Straw Medication Administration: Whole meds with puree Supervision: Intermittent supervision/cueing for swallowing strategies;Patient able to self-feed Swallowing strategies  : Slow rate;Small bites/sips;Chin tuck Postural changes: Position pt fully upright for meals Oral care recommendations: Oral care BID (2x/day)     Angela Nevin, MA, CCC-SLP Speech Therapy

## 2023-08-06 NOTE — Progress Notes (Signed)
      [address]       [place] [address]             [phone]                 9 Days Post-Op Procedure(s) (LRB): XI ROBOTIC ASSISTED IVOR LEWIS ESOPHAGECTOMY (N/A) EGD (ESOPHAGOGASTRODUODENOSCOPY) (N/A)   Events: No events _______________________________________________________________ Vitals: BP (!) 139/52   Pulse 89   Temp 98.1 F (36.7 C) (Axillary)   Resp 13   Ht 5\' 10"  (1.778 m)   Wt 84.9 kg   SpO2 95%   BMI 26.86 kg/m  Filed Weights   08/04/23 0405 08/05/23 0457 08/06/23 0500  Weight: 87.3 kg 88.3 kg 84.9 kg     - Neuro: alert NAD  - Cardiovascular: sinus  Drips: none.      - PulmFarrel Conners, SS CT output  ABG    Component Value Date/Time   PHART 7.4 07/29/2023 0514   PCO2ART 36 07/29/2023 0514   PO2ART 70 (L) 07/29/2023 0514   HCO3 25.8 08/02/2023 2006   TCO2 24 08/02/2023 1506   ACIDBASEDEF 1.0 08/02/2023 1506   O2SAT 94.5 08/02/2023 2006    - Abd: ND - Extremity: warm  .Intake/Output      03/18 0701 03/19 0700 03/19 0701 03/20 0700   I.V. (mL/kg) 73.5 (0.9) 7.5 (0.1)   Other 620    NG/GT 3425 260   IV Piggyback 204.2 49.9   Total Intake(mL/kg) 4322.6 (50.9) 317.4 (3.7)   Urine (mL/kg/hr) 2630 (1.3) 590 (0.8)   Emesis/NG output     Drains 65 10   Stool 0    Chest Tube 190 60   Total Output 2885 660   Net +1437.6 -342.6        Stool Occurrence 5 x       _______________________________________________________________ Labs:    Latest Ref Rng & Units 08/05/2023    8:41 AM 08/04/2023    2:47 AM 08/03/2023    2:19 AM  CBC  WBC 4.0 - 10.5 K/uL 14.4  8.9  9.4   Hemoglobin 13.0 - 17.0 g/dL 59.5  8.4  8.2   Hematocrit 39.0 - 52.0 % 32.0  26.3  25.3   Platelets 150 - 400 K/uL 390  235  207       Latest Ref Rng & Units 08/06/2023    5:17 AM 08/05/2023    8:41 AM 08/04/2023    2:47 AM  CMP  Glucose 70 - 99 mg/dL 638  756  433   BUN 8 - 23 mg/dL 58  68  68   Creatinine 0.61 - 1.24 mg/dL 2.95  1.88  4.16   Sodium  135 - 145 mmol/L 146  144  141   Potassium 3.5 - 5.1 mmol/L 4.4  4.6  4.4   Chloride 98 - 111 mmol/L 111  111  110   CO2 22 - 32 mmol/L 25  24  22    Calcium 8.9 - 10.3 mg/dL 8.1  8.1  8.0     CXR: stable  _______________________________________________________________  Assessment and Plan: POD 9 s/p MIE  Neuro: pain controlled CV: stable Pulm: IS, ambulation Renal: creat trending down GI: Cleared for dysphagia 2 diet with chin tuck. Heme: stable ID: afebrile.   Endo: SSI Dispo: To see   [name] 08/06/2023 3:21 PM

## 2023-08-06 NOTE — Progress Notes (Signed)
 Physical Therapy Treatment Patient Details Name: Seth Burch MRN: [id number] DOB: 1955/09/17 Today's Date: 08/06/2023   History of Present Illness 68 y.o. 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 Pt with trace R ptx, + atelectasis. 3/13 worsening renal function with transfer to ICU. 3/15-3/17 intubation. PMHx: HTN, DM    PT Comments  Pt in bed upon arrival and agreeable to PT session. Worked primarily on gait training in today's session. Pt was able to stand with MinAx2 for line management, slight boost up from EOB, and to steady RW. Pt was able to improve by increasing gait distance to 370 ft with RW and MinAx2. Pt needed assistance for RW management when turning and to avoid obstacles. Pt is progressing well towards goals. Pt would continue to benefit from >3hrs post acute rehab to work towards independence with mobility. Acute PT to follow.   BP 139/52; 96 BPM 94% on 3L at rest, >90% on 3L during gait     If plan is discharge home, recommend the following: A lot of help with walking and/or transfers;A lot of help with bathing/dressing/bathroom;Assistance with cooking/housework;Assist for transportation;Help with stairs or ramp for entrance     Equipment Recommendations  Rolling walker (2 wheels);BSC/3in1;Wheelchair (measurements PT);Wheelchair cushion (measurements PT)    Recommendations for Other Services Rehab consult     Precautions / Restrictions Precautions Precautions: Fall;Other (comment) Precaution/Restrictions Comments: L UQ jejunostomy, R chest tube, JP bulb, cortrak Restrictions Weight Bearing Restrictions Per Provider Order: No     Mobility  Bed Mobility Overal bed mobility: Needs Assistance Bed Mobility: Rolling, Sit to Sidelying Rolling: Contact guard assist Sidelying to sit: Mod assist    General bed mobility comments: able to roll with CGA for safety, ModA for trunk elevation and to complete moving  LE's off EOB. Cues to shift hips forwards    Transfers Overall transfer level: Needs assistance Equipment used: Rolling walker (2 wheels) Transfers: Sit to/from Stand Sit to Stand: Min assist, +2 safety/equipment    General transfer comment: MinAx2 for safety with lines, slight boost up from EOB and assist to hold RW steady. Cues for hand placement    Ambulation/Gait Ambulation/Gait assistance: Min assist, +2 safety/equipment Gait Distance (Feet): 370 Feet Assistive device: Rolling walker (2 wheels) Gait Pattern/deviations: Step-through pattern, Decreased stride length, Trunk flexed, Knee flexed in stance - left, Knee flexed in stance - right Gait velocity: decr     General Gait Details: MinA for cues with RW managment to avoid obstacles and when performing turns. Cues to relax shoulders as pt tends to have forward flexed posture with tension in B shoulders.        Balance Overall balance assessment: Needs assistance, Mild deficits observed, not formally tested Sitting-balance support: No upper extremity supported, Feet supported Sitting balance-Leahy Scale: Fair     Standing balance support: Bilateral upper extremity supported, During functional activity, Reliant on assistive device for balance Standing balance-Leahy Scale: Poor Standing balance comment: reliant on UE support and external support       Communication Communication Communication: No apparent difficulties  Cognition Arousal: Alert Behavior During Therapy: Flat affect, WFL for tasks assessed/performed   PT - Cognitive impairments: No apparent impairments    Following commands: Intact      Cueing Cueing Techniques: Verbal cues         Pertinent Vitals/Pain Pain Assessment Pain Assessment: Faces Faces Pain Scale: Hurts little more Pain Location: L abdomen, low back Pain Descriptors / Indicators:  Aching, Discomfort, Guarding Pain Intervention(s): Limited activity within patient's tolerance,  Monitored during session, Premedicated before session, Repositioned     PT Goals (current goals can now be found in the care plan section) Acute Rehab PT Goals PT Goal Formulation: With patient/family Time For Goal Achievement: 08/13/23 Potential to Achieve Goals: Good Progress towards PT goals: Progressing toward goals    Frequency    Min 3X/week       AM-PAC PT "6 Clicks" Mobility   Outcome Measure  Help needed turning from your back to your side while in a flat bed without using bedrails?: A Little Help needed moving from lying on your back to sitting on the side of a flat bed without using bedrails?: A Lot Help needed moving to and from a bed to a chair (including a wheelchair)?: A Little Help needed standing up from a chair using your arms (e.g., wheelchair or bedside chair)?: A Little Help needed to walk in hospital room?: A Little Help needed climbing 3-5 steps with a railing? : Total 6 Click Score: 15    End of Session Equipment Utilized During Treatment: Oxygen Activity Tolerance: Patient tolerated treatment well Patient left: in chair;with call bell/phone within reach;with family/visitor present Nurse Communication: Mobility status PT Visit Diagnosis: Unsteadiness on feet (R26.81);Muscle weakness (generalized) (M62.81);Other abnormalities of gait and mobility (R26.89)     Time: 2130-8657 PT Time Calculation (min) (ACUTE ONLY): 27 min  Charges:    $Gait Training: 8-22 mins $Therapeutic Activity: 8-22 mins PT General Charges $$ ACUTE PT VISIT: 1 Visit                    Hilton Cork, PT, DPT Secure Chat Preferred  Rehab Office [phone]   Arturo Morton Brion Aliment 08/06/2023, 9:24 AM

## 2023-08-06 NOTE — Progress Notes (Signed)
 Nutrition Follow-up  DOCUMENTATION CODES:   Non-severe (moderate) malnutrition in context of chronic illness  INTERVENTION:   Encouraged pt to focus on taking small sips and bites only, chewing well and taking his time. Plan to follow-up tomorrow to check for tolerance and consideration of oral nutrition supplement such as Ensure  Continue to recommend close monitoring of skin around J-tube with frequent dressing changes   Tube Feeding via J-tube: Continue Osmolite 1.5 at 65 ml/hr (TF at goal provides 2340 kcals, 98 g of protein and 1185 mL of free water) Continue free water flush of 200 mL q 4 hours: provides 1200 mL in 24 hours (does not include free water from TF or additional flushes). Total free water 2385 mL in 24 hours Pro-Source TF20 60 mL daily currently for increased needs post-op. Provides additional 20 g of protein   Monitor sodium trend, renal function with plans to adjust free water as needed   NUTRITION DIAGNOSIS:   Moderate Malnutrition related to chronic illness (esophageal cancer s/p chemo, radiation and surgery) as evidenced by percent weight loss, mild fat depletion, moderate muscle depletion.  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  PMH includes DM, HTN, dyslipidemia, CKD, IgG kappa MGUS  3/10 EGD, XI Robotic Assisted Ivor Lewis Esophagectomy, J-tube placement 3/11 Trickle TF initiated 3/12 Surgery increased TF to 40 ml/hr 3/13 Surgery increased TF to 55 ml/hr, transferred to ICU  3/14 Surgery  increased TF to 65 ml/hr (goal), bowel regimen added and +BM 3/15 Intubated, Bronch (Thick mucus in trachea suctioned, partially occluding L mainstem, ?aspiration, recurrent false vocal cord irritation with swelling leading to stridor and resp failure) 3/17 Extubated 3/18 Esophogram:  Brisk tracheal aspiration of ingested contrast, into the LEFT mainstem bronchus. SLP eval recommended. Stagnant contrast at diaphragmatic hiatus. No anastomotic leak.  3/19 Diet advanced to Dysphagia 2/Thins, later downgraded to Nectar Thick Liquids  MBS performed today by SLP and diet advanced to Dysphagia 2, Thins with chin tuck and double swallow after each sip of liquid. Lunch tray arrived at time of RD visit. Pt just finished ambulating around the unit. Pt appears excited to try oral diet. Pt took a sip of tea and began to cough. RN reports pt coughed again multiple times with sips of tea, which per RN did not occur during MBS. RN notified SLP for re-evaluation and diet downgraded to Dys 2, Nectar liquids  J-tube site continues with significant leakage, dressing changed multiple times today at attempts of keeping skin around site dry. RN reports skin is reddened but no bleeding, open wound. RN also applies barrier cream after skin dried post each dressing change. Today's output from RN with so significant that is saturated the bed linens.   Osmolite 1.5 continues at 65 ml/hr with Pro-Source TF20 60 mL daily and free water flush of 200 mL q 4  hours.   Noted type 7 stools; recommend changing bowel regimen to prn.   Weight down to 84.9 kg. Admit wt near 90 kg  Labs: Sodium 146 (H) BUN 58 (H) Creatinine 2.07 (  H) CBGs 104-287 (goal 140-180)  Meds:  SS novolog Lantus Magic Mouthwash prn Reglan q 6 hours Miralax daily Colace BID Protonix   Diet Order:   Diet Order             DIET DYS 2 Room service appropriate? Yes; Fluid consistency: Nectar Thick  Diet effective now                    EDUCATION NEEDS:   Education needs have been addressed  Skin:  Skin Assessment: Skin Integrity Issues: Skin Integrity Issues:: Other (Comment) Incisions: new chest tube, new J-tube (07/28/2022) Other: Leaking around J-tube  Last BM:  3/19 type 7 medium  Height:   Ht Readings from Last 1 Encounters:  07/28/23 5\' 10"  (1.778 m)    Weight:   Wt Readings from Last 1 Encounters:  08/06/23 84.9 kg    BMI:  Body mass index is 26.86 kg/m.  Estimated Nutritional Needs:   Kcal:  2200-2400 kcals  Protein:  115-130 g  Fluid:  >/= 2L   Romelle Starcher MS, RDN, LDN, CNSC Registered Dietitian 3 Clinical Nutrition RD Inpatient Contact Info in Amion

## 2023-08-06 NOTE — Progress Notes (Signed)
 Patient ID: Seth Burch, male   DOB: 1956/01/16, 68 y.o.   MRN: [id number] [facility] Evening Rounds:  Afebrile, Hemodynamically stable.  Sats 94%  UO good. CT output low.  Diet dysphagia 2, Nectar-thick per ST.

## 2023-08-06 NOTE — Plan of Care (Signed)
  Problem: Education: Goal: Knowledge of General Education information will improve Description: Including pain rating scale, medication(s)/side effects and non-pharmacologic comfort measures Outcome: Progressing   Problem: Health Behavior/Discharge Planning: Goal: Ability to manage health-related needs will improve Outcome: Progressing   Problem: Clinical Measurements: Goal: Ability to maintain clinical measurements within normal limits will improve Outcome: Progressing Goal: Diagnostic test results will improve Outcome: Progressing Goal: Respiratory complications will improve Outcome: Progressing Goal: Cardiovascular complication will be avoided Outcome: Progressing   Problem: Activity: Goal: Risk for activity intolerance will decrease Outcome: Progressing   Problem: Elimination: Goal: Will not experience complications related to bowel motility Outcome: Progressing   Problem: Pain Managment: Goal: General experience of comfort will improve and/or be controlled Outcome: Progressing   Problem: Skin Integrity: Goal: Risk for impaired skin integrity will decrease Outcome: Progressing   Problem: Clinical Measurements: Goal: Postoperative complications will be avoided or minimized Outcome: Progressing   Problem: Respiratory: Goal: Ability to maintain a clear airway will improve Outcome: Progressing   Problem: Skin Integrity: Goal: Demonstration of wound healing without infection will improve Outcome: Progressing

## 2023-08-06 NOTE — Progress Notes (Signed)
 NAME:  Seth Burch, MRN:  [id number], DOB:  1955-10-01, LOS: 9 ADMISSION DATE:  07/28/2023, CONSULTATION DATE:  3/13\ REFERRING MD:  Cliffton Asters, CHIEF COMPLAINT:  s/p esophagectomy, renal failure    History of Present Illness:  68 year old male with past medical history of diabetes, dyslipidemia, hypertension, CKD, distal esophageal carcinoma and progressive dysphagia s/p EUS and neoadjuvant chemoradiation who presented to [facility] for esophagectomy.  On 3/10 he had Ivor-Lewis esophagectomy, pyloromyotomy, laparoscopic jejunostomy tube placement.  On 3/11 he was started on gentle tube feeds.  Having urinary retention with subsequent Foley catheter placement.  Overnight into 3/12 had difficulty with pain control.  On day of consultation, patient noted to be somnolent, difficulty with taking deep breaths and coughing.  He has had progressive worsening renal function with creatinine up to 2.49.  He has had low-grade fevers which is not likely to be due to respiratory atelectasis.  He was transferred to ICU for closer monitoring and CCM was consulted to help with medical management.  Pertinent  Medical History  diabetes, dyslipidemia, hypertension, CKD, distal esophageal carcinoma and progressive dysphagia s/p EUS and neoadjuvant chemoradiation  Significant Hospital Events: Including procedures, antibiotic start and stop dates in addition to other pertinent events   3/10: Esophagectomy, pyloromyotomy, jejunostomy placement 3/11: Gentle tube feeds started, urinary retention, increasing creatinine 3/12: Difficulty with pain control 3/13: Worsening renal function, low-grade fevers, not up and ambulating, transferred to ICU 3/15 sudden onset stridor refractory to conservative measures requiring intubation: marked false cord swelling noted  Interim History / Subjective:  Patient denies any complaint, stated slept few hours last night Still awaiting swallow evaluation Having loose stools after tube feed  has been restarted Walked in the hallway, tolerated well  Objective   Blood pressure (!) 139/52, pulse 89, temperature 98.2 F (36.8 C), temperature source Oral, resp. rate 13, height 5\' 10"  (1.778 m), weight 84.9 kg, SpO2 95%.        Intake/Output Summary (Last 24 hours) at 08/06/2023 0919 Last data filed at 08/06/2023 0800 Gross per 24 hour  Intake 4317.37 ml  Output 2790 ml  Net 1527.37 ml   Filed Weights   08/04/23 0405 08/05/23 0457 08/06/23 0500  Weight: 87.3 kg 88.3 kg 84.9 kg    Examination: General: Elderly male, sitting on recliner  HEENT: [place]/AT, eyes anicteric.  moist mucus membranes Neuro: Alert, awake following commands Chest: Coarse breath sounds, no wheezes or rhonchi.  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 Hypernatremia Diabetes w/ hyperglycemia worsened by dexamethasone, improving Acute respiratory failure secondary to vocal cord swelling and likely aspiration PNA s/p intubation and therapeutic bronch 3/15 evening Oral thrush  [facility] following On TF and free water flushes Awaiting swallow evaluation Serum creatinine is slightly better than yesterday down to 2.0 Made 2.6 L of urine in last 24 hours Monitor intake and output Avoid nephrotoxic agent Chest tube management per [facility] Chest tube output was 190 cc in last 24 hours Continue insulin infusion for now until swallow evaluation is done, then switch to long-acting and sliding scale Remain on 3 L oxygen, titrate with O2 sat goal 92%.  Encourage incentive spirometry and flutter valve  Continue Zosyn to complete 7 days therapy Continue  Diflucan and Magic mouthwash   Best Practice (right click and "Reselect all SmartList Selections" daily)   Diet/type: tubefeeds and NPO DVT prophylaxis: LMWH Pressure ulcer(s): na GI prophylaxis: N/A Lines: N/A Foley:  Yes, and it is still needed Code Status:  full code Last date of multidisciplinary goals of care discussion: 3/18: Patient and his wife were updated at bedside, decision was to continue full scope of care    Cheri Fowler, MD [facility] See Amion for pager If no response to pager, please call [phone] until 7pm After 7pm, Please call E-link [phone]

## 2023-08-06 NOTE — Progress Notes (Signed)
 Speech Language Pathology Treatment: Dysphagia  Patient Details Name: Seth Burch MRN: [id number] DOB: Sep 28, 1955 Today's Date: 08/06/2023 Time: 7829-5621 SLP Time Calculation (min) (ACUTE ONLY): 15 min  Assessment / Plan / Recommendation Clinical Impression  SLP returned to see patient after his RN alerted that patient was coughing consistently with sips of water, even when sitting up in chair and performing chin tuck. Patient indicated that he put some lemon in the water and he feels that  "That bite" caused him to need to clear his throat. SLP observed him with PO intake of thin liquids as well as nectar thick liquids. He did exhibit instances of throat clearing with thin liquids but not with nectar thick. He indicated that nectar thick liquids felt better when he was swallowing. SLP discussed MBS findings of increased pharyngeal residuals with nectar thick liquids but no aspiration. Patient, spouse and SLP all in agreement to try nectar thick liquids with meals and thin liquids (with chin tuck posture) in between meals. SLP will continue to follow.   HPI HPI: Patient is a 68 y.o. male with PMH: HTN, DM. He presented to [facility] on [facility]23 for esophagectomy, pyloromyotomy, jejunostomy placed and gentle tube feedings started on 3/11. Patient with worsening renal function, low grade fevers and transferred to ICU. On 3/15 he had sudden onset of stridor and required intubation; marked false cord swelling noted. Bronchoscopy performed on 3/15 which reported thick mucus in trachea partially occluding left mainstem, suspicion or aspiration and mucus plugging with recurrent false vocal cord irritation leading to swelling, stridor, respiratory failure. He was extubated on 3/17. On 3/18, barium esophagram was completed to evaluate for an anastomotic leak and aspiration of barium liquid was reported, prompting SLP swallow evaluation order. SLP recommending MBS.      SLP Plan  Continue with current plan of  care      Recommendations for follow up therapy are one component of a multi-disciplinary discharge planning process, led by the attending physician.  Recommendations may be updated based on patient status, additional functional criteria and insurance authorization.    Recommendations  Diet recommendations: Dysphagia 2 (fine chop);Nectar-thick liquid;Other(comment) (thin liquids in between meals with chin tuck) Liquids provided via: Cup;Straw Medication Administration: Whole meds with puree Supervision: Patient able to self feed Compensations: Slow rate;Small sips/bites;Chin tuck Postural Changes and/or Swallow Maneuvers: Seated upright 90 degrees                      None Dysphagia, pharyngoesophageal phase (R13.14)     Continue with current plan of care    Angela Nevin, MA, CCC-SLP Speech Therapy

## 2023-08-07 DIAGNOSIS — R1310 Dysphagia, unspecified: Secondary | ICD-10-CM

## 2023-08-07 DIAGNOSIS — Z794 Long term (current) use of insulin: Secondary | ICD-10-CM | POA: Diagnosis not present

## 2023-08-07 DIAGNOSIS — E870 Hyperosmolality and hypernatremia: Secondary | ICD-10-CM | POA: Diagnosis not present

## 2023-08-07 DIAGNOSIS — E1165 Type 2 diabetes mellitus with hyperglycemia: Secondary | ICD-10-CM | POA: Diagnosis not present

## 2023-08-07 DIAGNOSIS — C159 Malignant neoplasm of esophagus, unspecified: Secondary | ICD-10-CM | POA: Diagnosis not present

## 2023-08-07 LAB — BASIC METABOLIC PANEL
Anion gap: 5 (ref 5–15)
BUN: 49 mg/dL — ABNORMAL HIGH (ref 8–23)
CO2: 25 mmol/L (ref 22–32)
Calcium: 7.5 mg/dL — ABNORMAL LOW (ref 8.9–10.3)
Chloride: 108 mmol/L (ref 98–111)
Creatinine, Ser: 2.03 mg/dL — ABNORMAL HIGH (ref 0.61–1.24)
GFR, Estimated: 35 mL/min — ABNORMAL LOW (ref >60)
Glucose, Bld: 262 mg/dL — ABNORMAL HIGH (ref 70–99)
Potassium: 4.4 mmol/L (ref 3.5–5.1)
Sodium: 138 mmol/L (ref 135–145)

## 2023-08-07 LAB — GLUCOSE, CAPILLARY
Glucose-Capillary: 206 mg/dL — ABNORMAL HIGH (ref 70–99)
Glucose-Capillary: 189 mg/dL — ABNORMAL HIGH (ref 70–99)
Glucose-Capillary: 207 mg/dL — ABNORMAL HIGH (ref 70–99)
Glucose-Capillary: 109 mg/dL — ABNORMAL HIGH (ref 70–99)
Glucose-Capillary: 208 mg/dL — ABNORMAL HIGH (ref 70–99)

## 2023-08-07 LAB — CBC
HCT: 24.6 % — ABNORMAL LOW (ref 39.0–52.0)
Hemoglobin: 7.6 g/dL — ABNORMAL LOW (ref 13.0–17.0)
MCH: 32.3 pg (ref 26.0–34.0)
MCHC: 30.9 g/dL (ref 30.0–36.0)
MCV: 104.7 fL — ABNORMAL HIGH (ref 80.0–100.0)
Platelets: 320 10*3/uL (ref 150–400)
RBC: 2.35 MIL/uL — ABNORMAL LOW (ref 4.22–5.81)
RDW: 14.6 % (ref 11.5–15.5)
WBC: 12.8 10*3/uL — ABNORMAL HIGH (ref 4.0–10.5)
nRBC: 0 % (ref 0.0–0.2)

## 2023-08-07 MED ORDER — ENSURE ENLIVE PO LIQD
237.0000 mL | Freq: Two times a day (BID) | ORAL | Status: DC
  Administered 2023-08-07: 237 mL via ORAL

## 2023-08-07 MED ORDER — OSMOLITE 1.5 CAL PO LIQD
1000.0000 mL | ORAL | Status: DC
  Administered 2023-08-07: 1000 mL

## 2023-08-07 MED ORDER — INSULIN ASPART 100 UNIT/ML IJ SOLN
0.0000 [IU] | Freq: Every day | INTRAMUSCULAR | Status: DC
  Administered 2023-08-07 – 2023-08-08 (×2): 2 [IU] via SUBCUTANEOUS
  Administered 2023-08-12: 3 [IU] via SUBCUTANEOUS
  Administered 2023-08-13: 2 [IU] via SUBCUTANEOUS
  Administered 2023-08-18: 3 [IU] via SUBCUTANEOUS

## 2023-08-07 MED ORDER — INSULIN GLARGINE 100 UNIT/ML ~~LOC~~ SOLN
10.0000 [IU] | Freq: Two times a day (BID) | SUBCUTANEOUS | Status: DC
  Administered 2023-08-07: 10 [IU] via SUBCUTANEOUS
  Filled 2023-08-07 (×4): qty 0.1

## 2023-08-07 MED ORDER — PANTOPRAZOLE SODIUM 40 MG PO TBEC
40.0000 mg | DELAYED_RELEASE_TABLET | Freq: Every day | ORAL | Status: DC
  Administered 2023-08-07 – 2023-08-19 (×13): 40 mg via ORAL
  Filled 2023-08-07 (×13): qty 1

## 2023-08-07 MED ORDER — INSULIN ASPART 100 UNIT/ML IJ SOLN
0.0000 [IU] | Freq: Three times a day (TID) | INTRAMUSCULAR | Status: DC
  Administered 2023-08-07: 5 [IU] via SUBCUTANEOUS
  Administered 2023-08-08: 8 [IU] via SUBCUTANEOUS
  Administered 2023-08-08 (×2): 3 [IU] via SUBCUTANEOUS
  Administered 2023-08-09: 5 [IU] via SUBCUTANEOUS
  Administered 2023-08-09 – 2023-08-10 (×3): 2 [IU] via SUBCUTANEOUS
  Administered 2023-08-10: 5 [IU] via SUBCUTANEOUS
  Administered 2023-08-11: 3 [IU] via SUBCUTANEOUS
  Administered 2023-08-12 (×2): 5 [IU] via SUBCUTANEOUS
  Administered 2023-08-13 – 2023-08-14 (×3): 3 [IU] via SUBCUTANEOUS
  Administered 2023-08-14 (×2): 5 [IU] via SUBCUTANEOUS
  Administered 2023-08-15: 2 [IU] via SUBCUTANEOUS
  Administered 2023-08-15 (×2): 5 [IU] via SUBCUTANEOUS
  Administered 2023-08-16 (×2): 3 [IU] via SUBCUTANEOUS
  Administered 2023-08-16: 5 [IU] via SUBCUTANEOUS
  Administered 2023-08-17: 3 [IU] via SUBCUTANEOUS
  Administered 2023-08-17: 8 [IU] via SUBCUTANEOUS
  Administered 2023-08-17: 3 [IU] via SUBCUTANEOUS
  Administered 2023-08-18: 5 [IU] via SUBCUTANEOUS
  Administered 2023-08-18: 3 [IU] via SUBCUTANEOUS
  Administered 2023-08-18: 8 [IU] via SUBCUTANEOUS
  Administered 2023-08-19: 5 [IU] via SUBCUTANEOUS

## 2023-08-07 NOTE — Progress Notes (Signed)
      [address]       [place] [address]             [phone]                 10 Days Post-Op Procedure(s) (LRB): XI ROBOTIC ASSISTED IVOR LEWIS ESOPHAGECTOMY (N/A) EGD (ESOPHAGOGASTRODUODENOSCOPY) (N/A)   Events: No events _______________________________________________________________ Vitals: BP 135/69   Pulse 83   Temp 99.6 F (37.6 C) (Oral)   Resp (!) 22   Ht 5\' 10"  (1.778 m)   Wt 84.4 kg   SpO2 92%   BMI 26.70 kg/m  Filed Weights   08/05/23 0457 08/06/23 0500 08/07/23 0500  Weight: 88.3 kg 84.9 kg 84.4 kg     - Neuro: alert NAD  - Cardiovascular: sinus  Drips: none.      - PulmFarrel Burch, SS CT output  ABG    Component Value Date/Time   PHART 7.4 07/29/2023 0514   PCO2ART 36 07/29/2023 0514   PO2ART 70 (L) 07/29/2023 0514   HCO3 25.8 08/02/2023 2006   TCO2 24 08/02/2023 1506   ACIDBASEDEF 1.0 08/02/2023 1506   O2SAT 94.5 08/02/2023 2006    - Abd: ND - Extremity: warm  .Intake/Output      03/19 0701 03/20 0700 03/20 0701 03/21 0700   P.O. 1050    I.V. (mL/kg) 35.2 (0.4)    Other 40    NG/GT 1560    IV Piggyback 238.8    Total Intake(mL/kg) 2924 (34.6)    Urine (mL/kg/hr) 1740 (0.9)    Drains 125    Stool 0    Chest Tube 60    Total Output 1925    Net +999         Stool Occurrence 1 x       _______________________________________________________________ Labs:    Latest Ref Rng & Units 08/07/2023    5:00 AM 08/05/2023    8:41 AM 08/04/2023    2:47 AM  CBC  WBC 4.0 - 10.5 K/uL 12.8  14.4  8.9   Hemoglobin 13.0 - 17.0 g/dL 7.6  01.0  8.4   Hematocrit 39.0 - 52.0 % 24.6  32.0  26.3   Platelets 150 - 400 K/uL 320  390  235       Latest Ref Rng & Units 08/07/2023    5:00 AM 08/06/2023    5:17 AM 08/05/2023    8:41 AM  CMP  Glucose 70 - 99 mg/dL 272  536  644   BUN 8 - 23 mg/dL 49  58  68   Creatinine 0.61 - 1.24 mg/dL 0.34  7.42  5.95   Sodium 135 - 145 mmol/L 138  146  144   Potassium 3.5 - 5.1 mmol/L 4.4   4.4  4.6   Chloride 98 - 111 mmol/L 108  111  111   CO2 22 - 32 mmol/L 25  25  24    Calcium 8.9 - 10.3 mg/dL 7.5  8.1  8.1     CXR: stable  _______________________________________________________________  Assessment and Plan: POD 10 s/p MIE  Neuro: pain controlled CV: stable Pulm: IS, ambulation Renal: creat trending down GI: Cleared for dysphagia 2 diet with chin tuck. Heme: stable ID: afebrile.   Endo: SSI Dispo: floor   Seth Burch 08/07/2023 8:40 AM

## 2023-08-07 NOTE — Inpatient Diabetes Management (Signed)
 Inpatient Diabetes Program Recommendations  AACE/ADA: New Consensus Statement on Inpatient Glycemic Control (2015)  Target Ranges:  Prepandial:   less than 140 mg/dL      Peak postprandial:   less than 180 mg/dL (1-2 hours)      Critically ill patients:  140 - 180 mg/dL   Lab Results  Component Value Date   GLUCAP 207 (H) 08/07/2023   HGBA1C 7.1 (H) 07/24/2023    Review of Glycemic Control  Latest Reference Range & Units 08/06/23 07:27 08/06/23 11:22 08/06/23 12:26 08/06/23 14:19 08/06/23 15:12 08/06/23 20:23 08/06/23 23:56 08/07/23 04:19 08/07/23 08:04 08/07/23 11:35  Glucose-Capillary 70 - 99 mg/dL 956 (H) 387 (H) 564 (H) 171 (H) 133 (H) 260 (H) 247 (H) 206 (H) 189 (H) 207 (H)   Diabetes history: DM 2 Outpatient Diabetes medications: Jardiance 10 mg Daily, Lantus 26 units Daily, Metformin 1000 mg Daily Current orders for Inpatient glycemic control:  Lantus 10 units bid Novolog 0-15 units tid + hs  A1c 7.6% on 3/6 Osmolite 65 ml/hour  Inpatient Diabetes Program Recommendations:    -   Start Novolog 4 units Q4 hours Tube Feed Coverage  Thanks,  Christena Deem RN, MSN, BC-ADM Inpatient Diabetes Coordinator Team Pager [phone] (8a-5p)

## 2023-08-07 NOTE — Progress Notes (Signed)
 Speech Language Pathology Treatment: Dysphagia  Patient Details Name: Seth Burch MRN: [id number] DOB: 1955/07/30 Today's Date: 08/07/2023 Time: 2355-7322 SLP Time Calculation (min) (ACUTE ONLY): 17 min  Assessment / Plan / Recommendation Clinical Impression  Pt seen for dysphagia tx f/u session with current diet and upgraded textures of soft solids with min verbal cues provided primarily initially for chin tuck completion with thin liquids.  Pt's vocal quality remained normal throughout session during limited intake of puree, thin, nectar-thick liquids and soft solids (pineapple, to assess mixed consistency) with no overt s/s of aspiration present with use of compensatory strategy of chin tuck with thin liquids in small volumes via cup given min verbal cues and faded during session with pt completing independently by close of session.  Pt informed SLP of decreased appetite and limited satiety/consumption of current diet; requesting to remain on Dysphagia 2/nectar-thick with thin liquids allowed between meals with use of chin tuck.  Wife present and she and pt in agreement with plan for slow progression of diet given esophageal component/dysphagia.  ST will continue to f/u in acute setting for dysphagia tx/management.  HPI HPI: Patient is a 68 y.o. male with PMH: HTN, DM. He presented to [facility] on 07/28/23 for esophagectomy, pyloromyotomy, jejunostomy placed and gentle tube feedings started on 3/11. Patient with worsening renal function, low grade fevers and transferred to ICU. On 3/15 he had sudden onset of stridor and required intubation; marked false cord swelling noted. Bronchoscopy performed on 3/15 which reported thick mucus in trachea partially occluding left mainstem, suspicion or aspiration and mucus plugging with recurrent false vocal cord irritation leading to swelling, stridor, respiratory failure. He was extubated on 3/17. On 3/18, barium esophagram was completed to evaluate for an anastomotic  leak and aspiration of barium liquid was reported, prompting SLP swallow evaluation order. MBS completed with D2/NTL recommended with chin tuck trials with thin liquids.  ST f/u for education/dysphagia tx.      SLP Plan  Continue with current plan of care      Recommendations for follow up therapy are one component of a multi-disciplinary discharge planning process, led by the attending physician.  Recommendations may be updated based on patient status, additional functional criteria and insurance authorization.    Recommendations  Diet recommendations: Dysphagia 2 (fine chop);Nectar-thick liquid;Other(comment) (pt prefers to stay on Dysphagia 2/nectar-thick with thin using chin tuck in between meals for increasing satiety) Liquids provided via: Cup Medication Administration: Whole meds with puree Supervision: Patient able to self feed;Intermittent supervision to cue for compensatory strategies Compensations: Slow rate;Small sips/bites;Multiple dry swallows after each bite/sip;Other (Comment) (chin tuck with thin liquids and/or mixed consistencies) Postural Changes and/or Swallow Maneuvers: Seated upright 90 degrees;Upright 30-60 min after meal                  Oral care BID;Patient independent with oral care   None Dysphagia, pharyngoesophageal phase (R13.14)     Continue with current plan of care     Seth Burch,M.S.,CCC-SLP  08/07/2023, 10:40 AM

## 2023-08-07 NOTE — Progress Notes (Signed)
 Inpatient Rehabilitation Admissions Coordinator   Noted PT has changed dispo recommendations to [facility]. Ambulated 740 feet with CGA and RW with no rest breaks today. No need for CIR admit at this level . We will sign off. Acute team and TOC made aware.  Ottie Glazier, RN, MSN Rehab Admissions Coordinator [phone] 08/07/2023 10:30 AM

## 2023-08-07 NOTE — Progress Notes (Signed)
 Physical Therapy Treatment Patient Details Name: Seth Burch MRN: [id number] DOB: 1955/07/15 Today's Date: 08/07/2023   History of Present Illness 68 y.o. 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 Pt with trace R ptx, + atelectasis. 3/13 worsening renal function with transfer to ICU. 3/15-3/17 intubation. PMHx: HTN, DM    PT Comments  Pt in bed upon arrival with family present and agreeable to PT session. Pt has made significant progress towards acute PT goals. Pt was able to ambulate ~740 ft with CGA and a RW with no rest breaks. Pt progressed from needing MinA to stand with a RW to CGA. Changed post acute recommendations to HHPT to reflect current progress. Will continue to work with pt to progress mobility and work on Psychologist, counselling. Acute PT to follow.   BP 135/69, 88 HR 94 BPM, >93% SpO2 ON ra    If plan is discharge home, recommend the following: A lot of help with walking and/or transfers;A lot of help with bathing/dressing/bathroom;Assistance with cooking/housework;Assist for transportation;Help with stairs or ramp for entrance   Can travel by private vehicle      Yes  Equipment Recommendations  Rolling walker (2 wheels)       Precautions / Restrictions Precautions Precautions: Fall;Other (comment) Precaution/Restrictions Comments: L UQ jejunostomy, JP bulb, cortrak Restrictions Weight Bearing Restrictions Per Provider Order: No     Mobility  Bed Mobility Overal bed mobility: Needs Assistance Bed Mobility: Rolling, Sidelying to Sit, Sit to Supine Rolling: Contact guard assist Sidelying to sit: Min assist, Used rails, HOB elevated   Sit to supine: Min assist   General bed mobility comments: MinA to slightly raise trunk with cues for technique, slight MinA to complete moving LE's to EOB    Transfers Overall transfer level: Needs assistance Equipment used: Rolling walker (2 wheels) Transfers: Sit to/from  Stand Sit to Stand: Min assist, Contact guard assist    General transfer comment: Initially needed MinA for boost up, progressed to CGA with increased repetitions. Cues for technique with hand placement    Ambulation/Gait Ambulation/Gait assistance: Contact guard assist Gait Distance (Feet): 740 Feet Assistive device: Rolling walker (2 wheels) Gait Pattern/deviations: Step-through pattern, Decreased stride length, Trunk flexed Gait velocity: decr     General Gait Details: CGA for safety, cues for upright posture      Balance Overall balance assessment: Needs assistance, Mild deficits observed, not formally tested Sitting-balance support: No upper extremity supported, Feet supported Sitting balance-Leahy Scale: Good     Standing balance support: Bilateral upper extremity supported, During functional activity, Reliant on assistive device for balance Standing balance-Leahy Scale: Poor Standing balance comment: reliant on UE support       Communication Communication Communication: No apparent difficulties  Cognition Arousal: Alert Behavior During Therapy: WFL for tasks assessed/performed   PT - Cognitive impairments: No apparent impairments        Following commands: Intact      Cueing Cueing Techniques: Verbal cues  Exercises General Exercises - Lower Extremity Hip ABduction/ADduction: AROM, 5 reps, Both, Standing Hip Flexion/Marching: AROM, Both, 10 reps, Standing Other Exercises Other Exercises: 5 serial STS with RW, CGA    General Comments General comments (skin integrity, edema, etc.): Daughters present and supportive      Pertinent Vitals/Pain Pain Assessment Pain Assessment: No/denies pain     PT Goals (current goals can now be found in the care plan section) Acute Rehab PT Goals Patient Stated Goal: to get better PT  Goal Formulation: With patient/family Time For Goal Achievement: 08/13/23 Potential to Achieve Goals: Good    Frequency    Min  3X/week       AM-PAC PT "6 Clicks" Mobility   Outcome Measure  Help needed turning from your back to your side while in a flat bed without using bedrails?: A Little Help needed moving from lying on your back to sitting on the side of a flat bed without using bedrails?: A Little Help needed moving to and from a bed to a chair (including a wheelchair)?: A Little Help needed standing up from a chair using your arms (e.g., wheelchair or bedside chair)?: A Little Help needed to walk in hospital room?: A Little Help needed climbing 3-5 steps with a railing? : Total 6 Click Score: 16    End of Session   Activity Tolerance: Patient tolerated treatment well Patient left: with call bell/phone within reach;with family/visitor present;in bed;with nursing/sitter in room Nurse Communication: Mobility status PT Visit Diagnosis: Unsteadiness on feet (R26.81);Muscle weakness (generalized) (M62.81);Other abnormalities of gait and mobility (R26.89)     Time: 9563-8756 PT Time Calculation (min) (ACUTE ONLY): 29 min  Charges:    $Gait Training: 8-22 mins $Therapeutic Exercise: 8-22 mins PT General Charges $$ ACUTE PT VISIT: 1 Visit                     Hilton Cork, PT, DPT Secure Chat Preferred  Rehab Office [phone]   Arturo Morton Brion Aliment 08/07/2023, 9:19 AM

## 2023-08-07 NOTE — Progress Notes (Signed)
 NAME:  Seth Burch, MRN:  [id number], DOB:  March 22, 1956, LOS: 10 ADMISSION DATE:  07/28/2023, CONSULTATION DATE:  3/13\ REFERRING MD:  Cliffton Asters, CHIEF COMPLAINT:  s/p esophagectomy, renal failure    History of Present Illness:  68 year old male with past medical history of diabetes, dyslipidemia, hypertension, CKD, distal esophageal carcinoma and progressive dysphagia s/p EUS and neoadjuvant chemoradiation who presented to [facility] for esophagectomy.  On 3/10 he had Ivor-Lewis esophagectomy, pyloromyotomy, laparoscopic jejunostomy tube placement.  On 3/11 he was started on gentle tube feeds.  Having urinary retention with subsequent Foley catheter placement.  Overnight into 3/12 had difficulty with pain control.  On day of consultation, patient noted to be somnolent, difficulty with taking deep breaths and coughing.  He has had progressive worsening renal function with creatinine up to 2.49.  He has had low-grade fevers which is not likely to be due to respiratory atelectasis.  He was transferred to ICU for closer monitoring and CCM was consulted to help with medical management.  Pertinent  Medical History  diabetes, dyslipidemia, hypertension, CKD, distal esophageal carcinoma and progressive dysphagia s/p EUS and neoadjuvant chemoradiation  Significant Hospital Events: Including procedures, antibiotic start and stop dates in addition to other pertinent events   3/10: Esophagectomy, pyloromyotomy, jejunostomy placement 3/11: Gentle tube feeds started, urinary retention, increasing creatinine 3/12: Difficulty with pain control 3/13: Worsening renal function, low-grade fevers, not up and ambulating, transferred to ICU 3/15 sudden onset stridor refractory to conservative measures requiring intubation: marked false cord swelling noted  Interim History / Subjective:  No overnight issues Passed swallow evaluation, on dysphagia diet Walked in the hallway On room air Afebrile  Objective   Blood  pressure (!) 143/74, pulse 92, temperature 99.6 F (37.6 C), temperature source Oral, resp. rate 17, height 5\' 10"  (1.778 m), weight 84.4 kg, SpO2 91%.        Intake/Output Summary (Last 24 hours) at 08/07/2023 0956 Last data filed at 08/07/2023 0900 Gross per 24 hour  Intake 2965.18 ml  Output 1670 ml  Net 1295.18 ml   Filed Weights   08/05/23 0457 08/06/23 0500 08/07/23 0500  Weight: 88.3 kg 84.9 kg 84.4 kg    Examination: General: Elderly male, lying on the bed HEENT: [place]/AT, eyes anicteric.  moist mucus membranes Neuro: Alert, awake following commands Chest: Coarse breath sounds, no wheezes or rhonchi.  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 Diabetes w/ hyperglycemia Acute respiratory failure secondary to vocal cord swelling and likely aspiration PNA s/p intubation and therapeutic bronch 3/15 evening Oral thrush  [facility] following Patient has swallow evaluation, currently on dysphagia 2 diet Discontinue tube feeds and free water flushes Serum creatinine remained stable Made 1.7 L of urine in last 24 hours Monitor intake and output Avoid nephrotoxic agent Chest tube management per [facility] Chest tube output was 60 cc in last 24 hours Insulin infusion was transitioned to long-acting and sliding scale, remained hyperglycemic, will increase Lantus to twice daily Encourage incentive spirometry Completed Zosyn for aspiration pneumonia Completed Diflucan and Magic mouthwash for oral thrush   Best Practice (right click and "Reselect all SmartList Selections" daily)   Diet/type:  Dysphagia 2 diet DVT prophylaxis: LMWH Pressure ulcer(s): na GI prophylaxis: N/A Lines: N/A Foley:  Yes, and it is still needed Code Status:  full code Last date of multidisciplinary goals of care discussion: 3/18: Patient and his wife were updated at bedside, decision was to continue full scope of care    Cheri Fowler, MD [facility] See Amion for pager If no response to pager, please call [phone] until 7pm After 7pm, Please call E-link [phone]

## 2023-08-07 NOTE — Progress Notes (Signed)
 Nutrition Follow-up  DOCUMENTATION CODES:   Non-severe (moderate) malnutrition in context of chronic illness  INTERVENTION:   Transition to nocturnal TF today. Plan to start at rate to meet 75% of nutritional needs. Need to ensure pt can tolerate increased TF rate; TF goal rate and infusion time will be dependent on tolerance and po intake.   Tube Feeding via J-tube:  Osmolite 1.5 at 80 ml/hr x 14 hours Pro-Source TF20 60 mL daily TF provides 1760 kcals, 90 g of protein and 851 mL of free water  May need to resume free water flushes per J-tube pending oral intake and ability to maintain hydration  Continue to recommend close monitoring of skin around J-tube with frequent dressing changes   Encourage po intake as tolerated; Family to bring in protein shake from home as pt prefers this to Ensure.   Recommend changing any meds that are per tube to by mouth if able, especially those in liquid form as these tend to have higher osmolalities and can contribute to diarrhea   NUTRITION DIAGNOSIS:   Moderate Malnutrition related to chronic illness (esophageal cancer s/p chemo, radiation and surgery) as evidenced by percent weight loss, mild fat depletion, moderate muscle depletion.  Being addressed via TF, oral diet  GOAL:   Patient will meet greater than or equal to 90% of their needs  Progressing  MONITOR:   PO intake, Supplement acceptance, Labs, I & O's, Skin, TF tolerance  REASON FOR ASSESSMENT:   Consult Enteral/tube feeding initiation and management, Assessment of nutrition requirement/status (New J-tube post Esophagectomy)  ASSESSMENT:   68 yo male with hx of primary adenocarcinoma of distal third esophagus s/p chemo and radiation and admitted for scheduled esophagectomy.  PMH includes DM, HTN, dyslipidemia, CKD, IgG kappa MGUS  3/10 EGD, XI Robotic Assisted Ivor Lewis Esophagectomy, J-tube placement 3/11 Trickle TF initiated 3/12 Surgery increased TF to 40  ml/hr 3/13 Surgery increased TF to 55 ml/hr, transferred to ICU  3/14 Surgery increased TF to 65 ml/hr (goal), bowel regimen added and +BM 3/15 Intubated, Bronch (Thick mucus in trachea suctioned, partially occluding L mainstem, ?aspiration, recurrent false vocal cord irritation with swelling leading to stridor and resp failure) 3/17 Extubated 3/18 Esophogram:  Brisk tracheal aspiration of ingested contrast, into the LEFT mainstem bronchus. SLP eval recommended. Stagnant contrast at diaphragmatic hiatus. No anastomotic leak.  3/19 Diet advanced to Dysphagia 2/Thins, later downgraded to Nectar Thick Liquids 3/20 Transition to Nocturnal TF, family to bring protein shake in from home  Daughter, Seth Burch, at bedside. Pt sleeping soundly.  Per report pt tolerating nectar thick liquids well. Pt not eating much yet. Per Seth Burch, pt has received 4 meal trays since diet advanced and as not eaten any food from meal trays. Pt indicates the smell of the food is off putting. Pt did eat a cup of pineapple tidbits that family brought in which he enjoyed. Pt also drinking some thickened liquids. Pt did not particularly like the taste of Ensure. Family reports pt was using a protein shake at home that he prefers, unsure of name but based on description, sounds like maybe Premier Protein. Either way, encouraged family to bring in supplement from home if pt prefers  Osmolite 1.5 at 65 ml/hr via J-tube. RD consulted for transition to nocturnal TF. Noted TF off this morning. Noted free water flushes also discontinued.  J-tube leakage continues, dressing changed x 3 today thus far.   Noted IV zosyn started yesterday x 4 doses. RN reports pt with  multiple loose stools over night. Pt with 3 medium type 7 BMs documented today. Bowel regimen is currently prn. Reglan no longer ordered  Labs: BUN 49 (H) Creatinine 2.03 (H) Corrected Sodium 142 (serum glucose 262) CBGs 189-260  Meds: SS Novolog Lantus Magic Mouthwash  prn Colace prn Miralax prn  Diet Order:   Diet Order             DIET DYS 2 Room service appropriate? Yes; Fluid consistency: Nectar Thick  Diet effective now                   EDUCATION NEEDS:   Education needs have been addressed  Skin:  Skin Assessment: Skin Integrity Issues: Skin Integrity Issues:: Other (Comment) Incisions: new chest tube, new J-tube (07/28/2022) Other: Leaking around J-tube  Last BM:  3/20 type 7  Height:   Ht Readings from Last 1 Encounters:  08/07/23 5\' 10"  (1.778 m)    Weight:   Wt Readings from Last 1 Encounters:  08/07/23 84.1 kg    BMI:  Body mass index is 26.62 kg/m.  Estimated Nutritional Needs:   Kcal:  2200-2400 kcals  Protein:  115-130 g  Fluid:  >/= 2L   Seth Starcher MS, RDN, LDN, CNSC Registered Dietitian 3 Clinical Nutrition RD Inpatient Contact Info in Amion

## 2023-08-08 ENCOUNTER — Inpatient Hospital Stay (HOSPITAL_COMMUNITY)

## 2023-08-08 LAB — BASIC METABOLIC PANEL
Anion gap: 10 (ref 5–15)
BUN: 44 mg/dL — ABNORMAL HIGH (ref 8–23)
CO2: 22 mmol/L (ref 22–32)
Calcium: 7.9 mg/dL — ABNORMAL LOW (ref 8.9–10.3)
Chloride: 108 mmol/L (ref 98–111)
Creatinine, Ser: 1.97 mg/dL — ABNORMAL HIGH (ref 0.61–1.24)
GFR, Estimated: 37 mL/min — ABNORMAL LOW (ref >60)
Glucose, Bld: 270 mg/dL — ABNORMAL HIGH (ref 70–99)
Potassium: 4.2 mmol/L (ref 3.5–5.1)
Sodium: 140 mmol/L (ref 135–145)

## 2023-08-08 LAB — GLUCOSE, CAPILLARY
Glucose-Capillary: 268 mg/dL — ABNORMAL HIGH (ref 70–99)
Glucose-Capillary: 165 mg/dL — ABNORMAL HIGH (ref 70–99)
Glucose-Capillary: 173 mg/dL — ABNORMAL HIGH (ref 70–99)
Glucose-Capillary: 223 mg/dL — ABNORMAL HIGH (ref 70–99)

## 2023-08-08 MED ORDER — INSULIN GLARGINE 100 UNIT/ML ~~LOC~~ SOLN
15.0000 [IU] | Freq: Two times a day (BID) | SUBCUTANEOUS | Status: DC
  Administered 2023-08-08 (×2): 15 [IU] via SUBCUTANEOUS
  Filled 2023-08-08 (×4): qty 0.15

## 2023-08-08 MED ORDER — ACETAMINOPHEN 160 MG/5ML PO SOLN
650.0000 mg | Freq: Four times a day (QID) | ORAL | Status: DC | PRN
  Administered 2023-08-08 – 2023-08-13 (×5): 650 mg via ORAL
  Filled 2023-08-08 (×5): qty 20.3

## 2023-08-08 MED ORDER — METOPROLOL TARTRATE 25 MG/10 ML ORAL SUSPENSION
12.5000 mg | Freq: Two times a day (BID) | ORAL | Status: DC
  Administered 2023-08-08 – 2023-08-13 (×12): 12.5 mg via ORAL
  Filled 2023-08-08 (×13): qty 5

## 2023-08-08 MED ORDER — NON FORMULARY: 1000.0000 mL | Status: DC

## 2023-08-08 MED ORDER — BANATROL TF EN LIQD
60.0000 mL | Freq: Two times a day (BID) | ENTERAL | Status: DC
  Administered 2023-08-08 – 2023-08-09 (×3): 60 mL
  Filled 2023-08-08 (×3): qty 60

## 2023-08-08 MED ORDER — AMLODIPINE BESYLATE 5 MG PO TABS
5.0000 mg | ORAL_TABLET | Freq: Every day | ORAL | Status: DC
  Administered 2023-08-08: 5 mg via ORAL
  Filled 2023-08-08: qty 1

## 2023-08-08 MED ORDER — VIVONEX RTF PO LIQD
1000.0000 mL | ORAL | Status: DC
  Administered 2023-08-08 – 2023-08-18 (×11): 1000 mL via JEJUNOSTOMY
  Filled 2023-08-08 (×18): qty 1000

## 2023-08-08 MED ORDER — OXYCODONE HCL 5 MG PO TABS
5.0000 mg | ORAL_TABLET | ORAL | Status: DC | PRN
  Administered 2023-08-12: 5 mg via ORAL
  Filled 2023-08-08: qty 1

## 2023-08-08 MED ORDER — PROSOURCE TF20 ENFIT COMPATIBL EN LIQD
60.0000 mL | Freq: Three times a day (TID) | ENTERAL | Status: DC
  Administered 2023-08-08 – 2023-08-18 (×26): 60 mL
  Filled 2023-08-08 (×24): qty 60

## 2023-08-08 MED ORDER — PROSOURCE TF20 ENFIT COMPATIBL EN LIQD: 60.0000 mL | Freq: Four times a day (QID) | ENTERAL | Status: DC

## 2023-08-08 MED ORDER — OSMOLITE 1.5 CAL PO LIQD: 1000.0000 mL | ORAL | Status: DC

## 2023-08-08 NOTE — Progress Notes (Addendum)
 Nocturnal tube feed stopped at 6am, patients J-Tube site leaking/pouring bile. Several dressing changes through the night as well as liquid stools every hour. Patient and wife concerned about the site leaking, the redness of the surrounding skin as well as frequent liquid stools. Patient had 9 liquid stools within 10 hours.

## 2023-08-08 NOTE — Progress Notes (Addendum)
      [address]       [place] [address]             [phone]      11 Days Post-Op Procedure(s) (LRB): XI ROBOTIC ASSISTED IVOR LEWIS ESOPHAGECTOMY (N/A) EGD (ESOPHAGOGASTRODUODENOSCOPY) (N/A)  Subjective:  Patient had rough night.  He is having constant diarrhea, non stop.  Per wife he is losing weight because of it.  He has also a lot of drainage from J tube requiring dressing changes multiple times per hour  Objective: Vital signs in last 24 hours: Temp:  [98.4 F (36.9 C)-100.6 F (38.1 C)] 100.6 F (38.1 C) (03/21 0724) Pulse Rate:  [76-123] 88 (03/21 0724) Cardiac Rhythm: Normal sinus rhythm (03/21 0705) Resp:  [15-31] 21 (03/21 0724) BP: (125-156)/(63-81) 145/72 (03/21 0724) SpO2:  [90 %-95 %] 93 % (03/21 0724) Weight:  [82.5 kg-84.1 kg] 82.5 kg (03/21 0431)  Intake/Output from previous day: 03/20 0701 - 03/21 0700 In: 976.1 [NG/GT:870.8; IV Piggyback:105.2] Out: 148 [Urine:2; Drains:145; Stool:1]  General appearance: alert, cooperative, and no distress Heart: regular rate and rhythm Lungs: clear to auscultation bilaterally Abdomen: soft, non-tender; bowel sounds normal; no masses,  no organomegaly Extremities: extremities normal, atraumatic, no cyanosis or edema Wound: clean and dry.. J tube site, bilious/green purulent drainage present.. skin macerated  Lab Results: Recent Labs    08/05/23 0841 08/07/23 0500  WBC 14.4* 12.8*  HGB 10.0* 7.6*  HCT 32.0* 24.6*  PLT 390 320   BMET:  Recent Labs    08/07/23 0500 08/08/23 0334  NA 138 140  K 4.4 4.2  CL 108 108  CO2 25 22  GLUCOSE 262* 270*  BUN 49* 44*  CREATININE 2.03* 1.97*  CALCIUM 7.5* 7.9*    PT/INR: No results for input(s): "LABPROT", "INR" in the last 72 hours. ABG    Component Value Date/Time   PHART 7.4 07/29/2023 0514   HCO3 25.8 08/02/2023 2006   TCO2 24 08/02/2023 1506   ACIDBASEDEF 1.0 08/02/2023 1506   O2SAT 94.5 08/02/2023 2006   CBG (last 3)  Recent  Labs    08/07/23 1658 08/07/23 2110 08/08/23 0616  GLUCAP 109* 208* 268*    Assessment/Plan: S/P Procedure(s) (LRB): XI ROBOTIC ASSISTED IVOR LEWIS ESOPHAGECTOMY (N/A) EGD (ESOPHAGOGASTRODUODENOSCOPY) (N/A)  CV- NSR, BP remain stable- will stop IV hydralazine resume home Norvasc, continue Lopressor Pulm- no acute issues, get CXR today Renal- creatinine level improving, continue to monitor GI-tube feedings causing constant diarrhea, tube feedings leaking from J Tube--- 80 ml/hr is too high for these patients.. will decrease to 37ml/hr from 6pm-5 am.. will add banatrol BID to help bulk stool.. continue dysphagia 1 diet as tolerated ID- febrile today, leukocytosis improving..completed course of Zosyn.. monitor closely for now.. J drain output remains serous.[name] if this should change will get CT scan CAP DM- sugars are elevated, will increase Lantus to 15 units BID, continue SSIP if needed can add meal time coverage Deconditioning- patient needs to be up and ambulating, continue aggressive PT/OT  CXR reviewed- looks good, no evidence of effusion, right apical pneumothorax remains stable   LOS: 11 days    [name], PA-C 08/08/2023  Balloon was down Inflated some with saline Encourage more PO intake PT/OT  [name]

## 2023-08-08 NOTE — Progress Notes (Signed)
 Occupational Therapy Treatment Patient Details Name: Seth Burch MRN: [id number] DOB: [birth year] Today's Date: 08/08/2023   History of present illness 68 y.o. 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 Pt with trace R ptx, + atelectasis. 3/13 worsening renal function with transfer to ICU. 3/15-3/17 intubation. PMHx: HTN, DM   OT comments  OT session focused on training in techniques for increased safety and independence with ADLs and bed mobility/functional transfers/mobility during functional tasks. Pt currently demonstrates ability to complete ADLs largely with Contact guard to Min assist, bed mobility with Supervision to Contact guard assist, and functional mobility/transfers with a RW with Contact guard assist. Pt's HR in the 70s to 80s and O2 sat >/94% on RA throughout session. Pt participated well in session and has met or is making good progress toward all OT goals. Due to this, OT goals updated this day based on pt progress and current functional level. OT discharge recommendation also updated to [facility] OT. Pt will benefit from continued acute skilled OT services to address deficits outlined below and increase safety and independence with functional tasks.      If plan is discharge home, recommend the following:  A little help with walking and/or transfers;A little help with bathing/dressing/bathroom;Assistance with cooking/housework;Assist for transportation;Help with stairs or ramp for entrance   Equipment Recommendations  BSC/3in1    Recommendations for Other Services      Precautions / Restrictions Precautions Precautions: Fall;Other (comment) Precaution/Restrictions Comments: L UQ jejunostomy, JP bulb Restrictions Weight Bearing Restrictions Per Provider Order: No       Mobility Bed Mobility Overal bed mobility: Needs Assistance Bed Mobility: Sit to Supine, Sidelying to Sit   Sidelying to sit: Supervision, Used  rails, HOB elevated   Sit to supine: Contact guard assist, Used rails   General bed mobility comments: GCA for B LE management into bed    Transfers Overall transfer level: Needs assistance Equipment used: Rolling walker (2 wheels) Transfers: Sit to/from Stand, Bed to chair/wheelchair/BSC Sit to Stand: Contact guard assist     Step pivot transfers: Contact guard assist           Balance Overall balance assessment: Needs assistance, Mild deficits observed, not formally tested Sitting-balance support: No upper extremity supported, Feet supported Sitting balance-Leahy Scale: Good     Standing balance support: Single extremity supported, Bilateral upper extremity supported, During functional activity, Reliant on assistive device for balance Standing balance-Leahy Scale: Fair Standing balance comment: reliant on UE support in dynamic standing                           ADL either performed or assessed with clinical judgement   ADL Overall ADL's : Needs assistance/impaired     Grooming: Contact guard assist;Standing   Upper Body Bathing: Contact guard assist;Minimal assistance;Sitting;Cueing for compensatory techniques   Lower Body Bathing: Contact guard assist;Minimal assistance;Sitting/lateral leans;Sit to/from stand;Cueing for compensatory techniques (with increased time)   Upper Body Dressing : Contact guard assist;Sitting   Lower Body Dressing: Minimal assistance;Sit to/from stand;Sitting/lateral leans;Cueing for compensatory techniques   Toilet Transfer: Contact guard assist;BSC/3in1;Rolling walker (2 wheels) (step-pivot transfer) Toilet Transfer Details (indicate cue type and reason): simulated at EOB Toileting- Clothing Manipulation and Hygiene: Contact guard assist;Minimal assistance;Cueing for compensatory techniques;Sitting/lateral lean;Sit to/from stand       Functional mobility during ADLs: Contact guard assist;Rolling walker (2 wheels)       Extremity/Trunk Assessment Upper Extremity  Assessment Upper Extremity Assessment: Right hand dominant;Generalized weakness   Lower Extremity Assessment Lower Extremity Assessment: Defer to PT evaluation        Vision   Vision Assessment?: No apparent visual deficits   Perception     Praxis     Communication Communication Communication: No apparent difficulties   Cognition Arousal: Alert Behavior During Therapy: WFL for tasks assessed/performed Cognition: No apparent impairments             OT - Cognition Comments: Pt oriented x4 and pleasant throughout session with cognition WNL for tasks assessed.                 Following commands: Intact        Cueing   Cueing Techniques: Verbal cues  Exercises      Shoulder Instructions       General Comments HR in the 70s to 80s and O2 sat >/94% on RA throughout session; pt family members present during a portion of session    Pertinent Vitals/ Pain       Pain Assessment Pain Assessment: Faces Faces Pain Scale: Hurts a little bit Pain Location: Right side of flank Pain Descriptors / Indicators: Aching, Discomfort, Sore Pain Intervention(s): Limited activity within patient's tolerance, Monitored during session, Repositioned  Home Living                                          Prior Functioning/Environment              Frequency  Min 2X/week        Progress Toward Goals  OT Goals(current goals can now be found in the care plan section)  Progress towards OT goals: Progressing toward goals;Goals updated;Goals met and updated - see care plan  Acute Rehab OT Goals Patient Stated Goal: to return home OT Goal Formulation: With patient Time For Goal Achievement: 08/15/23 Potential to Achieve Goals: Good ADL Goals Pt Will Perform Grooming: with supervision;standing Pt Will Perform Lower Body Bathing: with supervision;sit to/from stand;sitting/lateral leans Pt Will Perform Upper  Body Dressing: with modified independence;sitting Pt Will Perform Lower Body Dressing: with modified independence;sit to/from stand;sitting/lateral leans Pt Will Transfer to Toilet: with supervision;regular height toilet;ambulating (with least restrictive AD) Pt Will Perform Toileting - Clothing Manipulation and hygiene: sit to/from stand;sitting/lateral leans;with modified independence  Plan      Co-evaluation                 AM-PAC OT "6 Clicks" Daily Activity     Outcome Measure   Help from another person eating meals?: Total Help from another person taking care of personal grooming?: A Little Help from another person toileting, which includes using toliet, bedpan, or urinal?: A Little Help from another person bathing (including washing, rinsing, drying)?: A Little Help from another person to put on and taking off regular upper body clothing?: A Little Help from another person to put on and taking off regular lower body clothing?: A Little 6 Click Score: 16    End of Session Equipment Utilized During Treatment: Rolling walker (2 wheels)  OT Visit Diagnosis: Unsteadiness on feet (R26.81);Other abnormalities of gait and mobility (R26.89);Muscle weakness (generalized) (M62.81);Other (comment) (decreased activity tolerance)   Activity Tolerance Patient tolerated treatment well   Patient Left in bed;with call bell/phone within reach;with family/visitor present   Nurse Communication Mobility status  Time: 1610-9604 OT Time Calculation (min): 18 min  Charges: OT General Charges $OT Visit: 1 Visit OT Treatments $Self Care/Home Management : 8-22 mins  Devanee Pomplun "Orson Eva., OTR/L, MA Acute Rehab [phone]   Lendon Colonel 08/08/2023, 4:35 PM

## 2023-08-08 NOTE — Plan of Care (Signed)
  Problem: Education: Goal: Knowledge of General Education information will improve Description: Including pain rating scale, medication(s)/side effects and non-pharmacologic comfort measures Outcome: Progressing   Problem: Health Behavior/Discharge Planning: Goal: Ability to manage health-related needs will improve Outcome: Progressing   Problem: Clinical Measurements: Goal: Ability to maintain clinical measurements within normal limits will improve Outcome: Progressing Goal: Will remain free from infection Outcome: Progressing Goal: Diagnostic test results will improve Outcome: Progressing Goal: Respiratory complications will improve Outcome: Progressing Goal: Cardiovascular complication will be avoided Outcome: Progressing   Problem: Activity: Goal: Risk for activity intolerance will decrease Outcome: Progressing   Problem: Nutrition: Goal: Adequate nutrition will be maintained Outcome: Progressing   Problem: Coping: Goal: Level of anxiety will decrease Outcome: Progressing   Problem: Elimination: Goal: Will not experience complications related to bowel motility Outcome: Progressing Goal: Will not experience complications related to urinary retention Outcome: Progressing   Problem: Pain Managment: Goal: General experience of comfort will improve and/or be controlled Outcome: Progressing   Problem: Safety: Goal: Ability to remain free from injury will improve Outcome: Progressing   Problem: Skin Integrity: Goal: Risk for impaired skin integrity will decrease Outcome: Progressing   Problem: Education: Goal: Knowledge of the prescribed therapeutic regimen will improve Outcome: Progressing   Problem: Bowel/Gastric: Goal: Gastrointestinal status for postoperative course will improve Outcome: Progressing   Problem: Nutritional: Goal: Ability to achieve adequate nutritional intake will improve Outcome: Progressing   Problem: Clinical Measurements: Goal:  Postoperative complications will be avoided or minimized Outcome: Progressing   Problem: Respiratory: Goal: Ability to maintain a clear airway will improve Outcome: Progressing   Problem: Skin Integrity: Goal: Demonstration of wound healing without infection will improve Outcome: Progressing   Problem: Education: Goal: Ability to describe self-care measures that may prevent or decrease complications (Diabetes Survival Skills Education) will improve Outcome: Progressing Goal: Individualized Educational Video(s) Outcome: Progressing   Problem: Coping: Goal: Ability to adjust to condition or change in health will improve Outcome: Progressing   Problem: Fluid Volume: Goal: Ability to maintain a balanced intake and output will improve Outcome: Progressing   Problem: Health Behavior/Discharge Planning: Goal: Ability to identify and utilize available resources and services will improve Outcome: Progressing Goal: Ability to manage health-related needs will improve Outcome: Progressing   Problem: Metabolic: Goal: Ability to maintain appropriate glucose levels will improve Outcome: Progressing   Problem: Nutritional: Goal: Maintenance of adequate nutrition will improve Outcome: Progressing Goal: Progress toward achieving an optimal weight will improve Outcome: Progressing   Problem: Skin Integrity: Goal: Risk for impaired skin integrity will decrease Outcome: Progressing

## 2023-08-08 NOTE — Progress Notes (Signed)
 Mobility Specialist Progress Note;   08/08/23 1150  Therapy Vitals  Temp 98.6 F (37 C)  Temp Source Oral  Pulse Rate 73  Resp 20  BP 130/69  Patient Position (if appropriate) Lying  Oxygen Therapy  SpO2 93 %  Mobility  Activity Ambulated with assistance in hallway  Level of Assistance Standby assist, set-up cues, supervision of patient - no hands on  Assistive Device Front wheel walker  Distance Ambulated (ft) 425 ft  Activity Response Tolerated well  Mobility Referral Yes  Mobility visit 1 Mobility  Mobility Specialist Start Time (ACUTE ONLY) 1150  Mobility Specialist Stop Time (ACUTE ONLY) 1212  Mobility Specialist Time Calculation (min) (ACUTE ONLY) 22 min   Pt agreeable to mobility. Requested assistance to [name] at [facility], void successful. Required MinG for bed mobility and light MinG to SV for ambulation. VSS throughout and no c/o when asked. Pt left sitting on EOB eating lunch with all needs met. Daughter in room.   Seth Burch Mobility Specialist Please contact via SecureChat or [employer] [phone]

## 2023-08-08 NOTE — Progress Notes (Signed)
 Nutrition Follow-up  DOCUMENTATION CODES:   Non-severe (moderate) malnutrition in context of chronic illness  INTERVENTION:   Discussed nutrition recommendations with Seth Barrett PA-C, patient and family  Recommend considering TPN given significant issues with enteral feeding (significant diarrhea, significant leakage around J-tube site, wt loss)  Tube Feeding via Cortrak: trial of elemental formula given concern for malabsorption/maldigestion Vivonex RTF at 65 ml/hr x 16 hours  TF provides 1040 kcals, 50 g of protein and 1323 mL of free water  Current TF will NOT meet pt's nutritional needs but may help determine if pt is dealing with element of maldigestion/malabsorption contributing to significant diarrhea, wt loss  Increase Pro-SourceTF20 60 mL to TID, each packet provides 20 g of protein, 80 kcals  D/C Ensure as pt does not like, not drinking  Encourage po intake as tolerated; discussed utilization of protein powder with pt and his family to increase protein intake by mouth. Family plans to bring in protein powder (suggested unflavored) that can be mixed with beverages, smoothies, milkshakes, etc.   Continue to recommend close monitoring of skin around J-tube with frequent dressing changes   NUTRITION DIAGNOSIS:   Moderate Malnutrition related to chronic illness (esophageal cancer s/p chemo, radiation and surgery) as evidenced by percent weight loss, mild fat depletion, moderate muscle depletion.  Continues but being addressed  GOAL:   Patient will meet greater than or equal to 90% of their needs  Not met but being addressed  MONITOR:   PO intake, Supplement acceptance, Labs, I & O's, Skin, TF tolerance  REASON FOR ASSESSMENT:   Consult Enteral/tube feeding initiation and management, Assessment of nutrition requirement/status (New J-tube post Esophagectomy)  ASSESSMENT:   68 yo male with hx of primary adenocarcinoma of distal third esophagus s/p chemo and  radiation and admitted for scheduled esophagectomy.  PMH includes DM, HTN, dyslipidemia, CKD, IgG kappa MGUS  3/10 EGD, XI Robotic Assisted Ivor Lewis Esophagectomy, J-tube placement 3/11 Trickle TF initiated 3/12 Surgery increased TF to 40 ml/hr 3/13 Surgery increased TF to 55 ml/hr, transferred to ICU  3/14 Surgery increased TF to 65 ml/hr (goal), bowel regimen added and +BM 3/15 Intubated, Bronch (Thick mucus in trachea suctioned, partially occluding L mainstem, ?aspiration, recurrent false vocal cord irritation with swelling leading to stridor and resp failure) 3/17 Extubated 3/18 Esophogram:  Brisk tracheal aspiration of ingested contrast, into the LEFT mainstem bronchus. SLP eval recommended. Stagnant contrast at diaphragmatic hiatus. No anastomotic leak.  3/19 Diet advanced to Dysphagia 2/Thins, later downgraded to Nectar Thick Liquids 3/20 Transition to Nocturnal TF, family to bring protein shake in from home 3/21 Significant diarrhea overnight, (9 stools in 10 hours), significant J-tube drainage, change to Vivonex with day time feedings  Very rough night, pt did not sleep at all. Pt reports frequent stools, every 30 mins to an hour or so. RN reports 9 liquid stools in 10 hours. Pt also require frequent dressing changes around J-tube site overnight.   Noted MD re-inflated balloon of J-tube today  TF currently off. Switched to nocturnal TF last night with rate of 80 ml/hr (previously on 65 ml/hr). Given significant diarrhea overnight with significant leakage of GI content from around J-tube site, question if pt may be experiencing maldigestion/malabsorption. Discussed changing formula to elemental in effort to better evaluate if current TF formula and malabsorption/maldigestion is problem.  Noted banatrol TF also added today   Strong smells still off putting, taste off as well. Discussed possibility of opening meal tray outside of room prior  to bringing in and sitting in front of patient  (allow smell to dissipate).   Pt does not like the Ensure supplements, pt also not really interested in protein shake from home. Discussed other options for supplementation with pt and family including use of protein powder (suggested unflavored) which can be added to any beverage, including smoothies and milkshakes, and other soft foods. Family and pt like this idea and plan to purchase protein powder and bring to pt  Discussed trial of day time feedings (as opposed to nocturnal TF) until we see if symptoms improve with change in formula in an effort to allow pt ability to get much needed rest/sleep this evening. If change in formula works, can change back to nocturnal TF in the coming days. Also need to consider resuming feedings over a longer time frame, even 24 hour feedings, if po intake remains poor  Weight down to 82.5 kg, +wt loss since admission  Labs: BUN 44, Creatinine 1.97, sodium 140 (wdl) Meds: ss novolog, banatrol TF, lantus   Diet Order:   Diet Order             DIET DYS 2 Room service appropriate? Yes; Fluid consistency: Nectar Thick  Diet effective now                   EDUCATION NEEDS:   Education needs have been addressed  Skin:  Skin Assessment: Skin Integrity Issues: Skin Integrity Issues:: Other (Comment) Incisions: new chest tube, new J-tube (07/28/2022) Other: Leaking around J-tube  Last BM:  3/21 9 type 7 stools in 10 hours overnight on nocturnal TF  Height:   Ht Readings from Last 1 Encounters:  08/07/23 5\' 10"  (1.778 m)    Weight:   Wt Readings from Last 1 Encounters:  08/08/23 82.5 kg    BMI:  Body mass index is 26.1 kg/m.  Estimated Nutritional Needs:   Kcal:  2200-2400 kcals  Protein:  115-130 g  Fluid:  >/= 2L   Seth Starcher MS, RDN, LDN, CNSC Registered Dietitian 3 Clinical Nutrition RD Inpatient Contact Info in Amion

## 2023-08-09 LAB — GLUCOSE, CAPILLARY
Glucose-Capillary: 116 mg/dL — ABNORMAL HIGH (ref 70–99)
Glucose-Capillary: 239 mg/dL — ABNORMAL HIGH (ref 70–99)
Glucose-Capillary: 142 mg/dL — ABNORMAL HIGH (ref 70–99)
Glucose-Capillary: 179 mg/dL — ABNORMAL HIGH (ref 70–99)

## 2023-08-09 LAB — BASIC METABOLIC PANEL
Anion gap: 10 (ref 5–15)
BUN: 41 mg/dL — ABNORMAL HIGH (ref 8–23)
CO2: 22 mmol/L (ref 22–32)
Calcium: 8.3 mg/dL — ABNORMAL LOW (ref 8.9–10.3)
Chloride: 109 mmol/L (ref 98–111)
Creatinine, Ser: 1.77 mg/dL — ABNORMAL HIGH (ref 0.61–1.24)
GFR, Estimated: 42 mL/min — ABNORMAL LOW (ref >60)
Glucose, Bld: 120 mg/dL — ABNORMAL HIGH (ref 70–99)
Potassium: 4.1 mmol/L (ref 3.5–5.1)
Sodium: 141 mmol/L (ref 135–145)

## 2023-08-09 MED ORDER — INSULIN GLARGINE 100 UNIT/ML ~~LOC~~ SOLN
20.0000 [IU] | Freq: Two times a day (BID) | SUBCUTANEOUS | Status: DC
  Administered 2023-08-09 – 2023-08-11 (×5): 20 [IU] via SUBCUTANEOUS
  Filled 2023-08-09 (×6): qty 0.2

## 2023-08-09 MED ORDER — NUTRISOURCE FIBER PO PACK
1.0000 | PACK | Freq: Three times a day (TID) | ORAL | Status: DC
Start: 2023-08-09 — End: 2023-08-12
  Administered 2023-08-09 – 2023-08-12 (×9): 1
  Filled 2023-08-09 (×11): qty 1

## 2023-08-09 MED ORDER — AMLODIPINE BESYLATE 10 MG PO TABS
10.0000 mg | ORAL_TABLET | Freq: Every day | ORAL | Status: DC
  Administered 2023-08-09 – 2023-08-19 (×11): 10 mg via ORAL
  Filled 2023-08-09 (×11): qty 1

## 2023-08-09 NOTE — Plan of Care (Signed)
  Problem: Education: Goal: Knowledge of General Education information will improve Description: Including pain rating scale, medication(s)/side effects and non-pharmacologic comfort measures Outcome: Progressing   Problem: Health Behavior/Discharge Planning: Goal: Ability to manage health-related needs will improve Outcome: Progressing   Problem: Clinical Measurements: Goal: Ability to maintain clinical measurements within normal limits will improve Outcome: Progressing Goal: Will remain free from infection Outcome: Progressing Goal: Diagnostic test results will improve Outcome: Progressing Goal: Respiratory complications will improve Outcome: Progressing Goal: Cardiovascular complication will be avoided Outcome: Progressing   Problem: Activity: Goal: Risk for activity intolerance will decrease Outcome: Progressing   Problem: Nutrition: Goal: Adequate nutrition will be maintained Outcome: Progressing   Problem: Coping: Goal: Level of anxiety will decrease Outcome: Progressing   Problem: Elimination: Goal: Will not experience complications related to bowel motility Outcome: Progressing Goal: Will not experience complications related to urinary retention Outcome: Progressing   Problem: Pain Managment: Goal: General experience of comfort will improve and/or be controlled Outcome: Progressing   Problem: Safety: Goal: Ability to remain free from injury will improve Outcome: Progressing   Problem: Skin Integrity: Goal: Risk for impaired skin integrity will decrease Outcome: Progressing   Problem: Education: Goal: Knowledge of the prescribed therapeutic regimen will improve Outcome: Progressing   Problem: Bowel/Gastric: Goal: Gastrointestinal status for postoperative course will improve Outcome: Progressing   Problem: Nutritional: Goal: Ability to achieve adequate nutritional intake will improve Outcome: Progressing   Problem: Clinical Measurements: Goal:  Postoperative complications will be avoided or minimized Outcome: Progressing   Problem: Respiratory: Goal: Ability to maintain a clear airway will improve Outcome: Progressing   Problem: Skin Integrity: Goal: Demonstration of wound healing without infection will improve Outcome: Progressing   Problem: Education: Goal: Ability to describe self-care measures that may prevent or decrease complications (Diabetes Survival Skills Education) will improve Outcome: Progressing Goal: Individualized Educational Video(s) Outcome: Progressing   Problem: Coping: Goal: Ability to adjust to condition or change in health will improve Outcome: Progressing   Problem: Fluid Volume: Goal: Ability to maintain a balanced intake and output will improve Outcome: Progressing   Problem: Health Behavior/Discharge Planning: Goal: Ability to identify and utilize available resources and services will improve Outcome: Progressing Goal: Ability to manage health-related needs will improve Outcome: Progressing   Problem: Metabolic: Goal: Ability to maintain appropriate glucose levels will improve Outcome: Progressing   Problem: Nutritional: Goal: Maintenance of adequate nutrition will improve Outcome: Progressing Goal: Progress toward achieving an optimal weight will improve Outcome: Progressing   Problem: Skin Integrity: Goal: Risk for impaired skin integrity will decrease Outcome: Progressing   Problem: Tissue Perfusion: Goal: Adequacy of tissue perfusion will improve Outcome: Progressing

## 2023-08-09 NOTE — Progress Notes (Signed)
 Pt had a total of 5x BM overnight. More frequent  around 4 am onwards. Stool is like apple sauce consistency. Encouraged pt as the frequency is lesser compared yesterday.  Plan of care ongoing.

## 2023-08-09 NOTE — Progress Notes (Signed)
 Mobility Specialist Progress Note:    08/09/23 1014  Mobility  Activity Ambulated with assistance in hallway;Ambulated with assistance in room  Level of Assistance Standby assist, set-up cues, supervision of patient - no hands on  Assistive Device Front wheel walker  Distance Ambulated (ft) 425 ft  Activity Response Tolerated well  Mobility visit 1 Mobility  Mobility Specialist Start Time (ACUTE ONLY) 1015  Mobility Specialist Stop Time (ACUTE ONLY) 1023  Mobility Specialist Time Calculation (min) (ACUTE ONLY) 8 min   Pt received in sitting EOB with wife at bedside. Pt agreeable to mobility session after brushing teeth. Ambulated in hallway with RW and SV. Tolerated well, asx throughout. Returned pt to room, all needs met, call bell in reach.    Feliciana Rossetti Mobility Specialist Please contact via Special educational needs teacher or  Rehab office at [phone]

## 2023-08-09 NOTE — Progress Notes (Signed)
 12 Days Post-Op Procedure(s) (LRB): XI ROBOTIC ASSISTED IVOR LEWIS ESOPHAGECTOMY (N/A) EGD (ESOPHAGOGASTRODUODENOSCOPY) (N/A) Subjective: Looks comfortable , some non productive cough and swallowing a little uncomfortable  Objective: Vital signs in last 24 hours: Temp:  [98.6 F (37 C)-100.1 F (37.8 C)] 98.9 F (37.2 C) (03/22 0710) Pulse Rate:  [73-86] 86 (03/22 0336) Cardiac Rhythm: Normal sinus rhythm (03/22 0710) Resp:  [15-22] 22 (03/22 0336) BP: (96-166)/(53-80) 143/69 (03/22 0710) SpO2:  [93 %-98 %] 98 % (03/22 0336) Weight:  [80.1 kg] 80.1 kg (03/22 0335)  Hemodynamic parameters for last 24 hours:    Intake/Output from previous day: 03/21 0701 - 03/22 0700 In: 1016.5 [NG/GT:986.5] Out: 310 [Urine:200; Drains:110] Intake/Output this shift: No intake/output data recorded.  General appearance: alert, cooperative, and no distress Heart: regular rate and rhythm Lungs: clear to auscultation bilaterally Abdomen: minor distension, soft, some incisional soreness Extremities: no edema or calf tenderness Wound: incis healing well Minor drainage from J tube site, much less than it has been since baloon inflated further Lab Results: Recent Labs    08/07/23 0500  WBC 12.8*  HGB 7.6*  HCT 24.6*  PLT 320   BMET:  Recent Labs    08/08/23 0334 08/09/23 0343  NA 140 141  K 4.2 4.1  CL 108 109  CO2 22 22  GLUCOSE 270* 120*  BUN 44* 41*  CREATININE 1.97* 1.77*  CALCIUM 7.9* 8.3*    PT/INR: No results for input(s): "LABPROT", "INR" in the last 72 hours. ABG    Component Value Date/Time   PHART 7.4 07/29/2023 0514   HCO3 25.8 08/02/2023 2006   TCO2 24 08/02/2023 1506   ACIDBASEDEF 1.0 08/02/2023 1506   O2SAT 94.5 08/02/2023 2006   CBG (last 3)  Recent Labs    08/08/23 1614 08/08/23 2108 08/09/23 0629  GLUCAP 173* 223* 116*    Meds Scheduled Meds:  amLODipine  5 mg Oral Daily   Chlorhexidine Gluconate Cloth  6 each Topical Daily   enoxaparin  (LOVENOX) injection  40 mg Subcutaneous Q24H   feeding supplement (PROSource TF20)  60 mL Per Tube TID   fiber supplement (BANATROL TF)  60 mL Per Tube BID   Gerhardt's butt cream   Topical TID   insulin aspart  0-15 Units Subcutaneous TID WC   insulin aspart  0-5 Units Subcutaneous QHS   insulin glargine  15 Units Subcutaneous BID   metoprolol tartrate  12.5 mg Oral BID   mouth rinse  15 mL Mouth Rinse 4 times per day   pantoprazole  40 mg Oral Daily   Continuous Infusions:  Vivonex RTF 1,000 mL (08/09/23 0608)   PRN Meds:.acetaminophen (TYLENOL) oral liquid 160 mg/5 mL, albuterol, fentaNYL, magic mouthwash w/lidocaine, morphine injection, ondansetron (ZOFRAN) IV, mouth rinse, mouth rinse, oxyCODONE, polyethylene glycol  Xrays DG CHEST PORT 1 VIEW Result Date: 08/08/2023 CLINICAL DATA:  History of esophagectomy. EXAM: PORTABLE CHEST 1 VIEW COMPARISON:  August 05, 2023. FINDINGS: The heart size and mediastinal contours are within normal limits. Right internal jugular Port-A-Cath is again noted. Right-sided chest tube is noted with minimal right apical pneumothorax. Mild left basilar subsegmental atelectasis is noted with possible small pleural effusion. The visualized skeletal structures are unremarkable. IMPRESSION: Stable right-sided chest tube with minimal right apical pneumothorax. Mild left basilar subsegmental atelectasis is noted with probable small left pleural effusion. Electronically Signed   By: Lupita Raider M.D.   On: 08/08/2023 12:24    Assessment/Plan: S/P Procedure(s) (LRB): XI ROBOTIC ASSISTED  IVOR LEWIS ESOPHAGECTOMY (N/A) EGD (ESOPHAGOGASTRODUODENOSCOPY) (N/A)  1 Tmax 100.1, S BP 90's-160's range, mostly on higher end- will increase norvasc- will increase Norvasc to 10, but may need additional agent , altace can't be restarted w/ renal insuff yet 2 O2 sats good on RA 3 voiding- not all measured 4 bulb drain 110 ml/24h- serosang 5 BS- fair control- will increase  glargine, cont SSI 6 renal fxn conts to improve-creat 1.77, BUN 41 7 will get CBC in am 8 lovenox for DVT ppx 9 cont rehab therapiesand pulm hygiene 10 cont TF's- stools somewhat more bulky, still frequent, care with PO w/ aspiration precautions   LOS: 12 days    Rowe Clack PA-C Pager [phone] 08/09/2023

## 2023-08-10 LAB — GLUCOSE, CAPILLARY
Glucose-Capillary: 95 mg/dL (ref 70–99)
Glucose-Capillary: 231 mg/dL — ABNORMAL HIGH (ref 70–99)
Glucose-Capillary: 145 mg/dL — ABNORMAL HIGH (ref 70–99)
Glucose-Capillary: 181 mg/dL — ABNORMAL HIGH (ref 70–99)
Glucose-Capillary: 185 mg/dL — ABNORMAL HIGH (ref 70–99)

## 2023-08-10 LAB — BASIC METABOLIC PANEL
Anion gap: 11 (ref 5–15)
BUN: 40 mg/dL — ABNORMAL HIGH (ref 8–23)
CO2: 21 mmol/L — ABNORMAL LOW (ref 22–32)
Calcium: 8.4 mg/dL — ABNORMAL LOW (ref 8.9–10.3)
Chloride: 106 mmol/L (ref 98–111)
Creatinine, Ser: 1.49 mg/dL — ABNORMAL HIGH (ref 0.61–1.24)
GFR, Estimated: 51 mL/min — ABNORMAL LOW (ref >60)
Glucose, Bld: 102 mg/dL — ABNORMAL HIGH (ref 70–99)
Potassium: 3.6 mmol/L (ref 3.5–5.1)
Sodium: 138 mmol/L (ref 135–145)

## 2023-08-10 LAB — CBC
HCT: 27.7 % — ABNORMAL LOW (ref 39.0–52.0)
Hemoglobin: 8.8 g/dL — ABNORMAL LOW (ref 13.0–17.0)
MCH: 31.8 pg (ref 26.0–34.0)
MCHC: 31.8 g/dL (ref 30.0–36.0)
MCV: 100 fL (ref 80.0–100.0)
Platelets: 473 10*3/uL — ABNORMAL HIGH (ref 150–400)
RBC: 2.77 MIL/uL — ABNORMAL LOW (ref 4.22–5.81)
RDW: 14 % (ref 11.5–15.5)
WBC: 9.3 10*3/uL (ref 4.0–10.5)
nRBC: 0 % (ref 0.0–0.2)

## 2023-08-10 MED ORDER — DOXAZOSIN MESYLATE 2 MG PO TABS
2.0000 mg | ORAL_TABLET | Freq: Every day | ORAL | Status: DC
Start: 2023-08-10 — End: 2023-08-11
  Administered 2023-08-10: 2 mg via ORAL
  Filled 2023-08-10 (×2): qty 1

## 2023-08-10 MED ORDER — PROPOFOL 500 MG/50ML IV EMUL
INTRAVENOUS | Status: AC
  Filled 2023-08-10: qty 100

## 2023-08-10 MED ORDER — PROPOFOL 500 MG/50ML IV EMUL
INTRAVENOUS | Status: AC
  Filled 2023-08-10: qty 50

## 2023-08-10 MED ORDER — PHENYLEPHRINE 80 MCG/ML (10ML) SYRINGE FOR IV PUSH (FOR BLOOD PRESSURE SUPPORT)
PREFILLED_SYRINGE | INTRAVENOUS | Status: AC
  Filled 2023-08-10: qty 10

## 2023-08-10 MED ORDER — PHENYLEPHRINE HCL-NACL 20-0.9 MG/250ML-% IV SOLN
INTRAVENOUS | Status: AC
  Filled 2023-08-10: qty 500

## 2023-08-10 NOTE — Progress Notes (Signed)
 13 Days Post-Op Procedure(s) (LRB): XI ROBOTIC ASSISTED IVOR LEWIS ESOPHAGECTOMY (N/A) EGD (ESOPHAGOGASTRODUODENOSCOPY) (N/A) Subjective: Seems to be having less frequent stools, remains loose Conts to feel stronger  Objective: Vital signs in last 24 hours: Temp:  [98.4 F (36.9 C)-98.9 F (37.2 C)] 98.4 F (36.9 C) (03/23 0256) Pulse Rate:  [68-92] 70 (03/23 0256) Cardiac Rhythm: Normal sinus rhythm (03/23 0713) Resp:  [16-26] 16 (03/23 0256) BP: (134-142)/(70-77) 142/77 (03/23 0256) SpO2:  [96 %-97 %] 96 % (03/23 0256) Weight:  [80 kg] 80 kg (03/23 0300)  Hemodynamic parameters for last 24 hours:    Intake/Output from previous day: 03/22 0701 - 03/23 0700 In: -  Out: 40 [Drains:40] Intake/Output this shift: No intake/output data recorded.  General appearance: alert, cooperative, and no distress Heart: regular rate and rhythm Lungs: clear to auscultation bilaterally Abdomen: benign Extremities: no edema or calf tenderness Wound: incis healing well  Lab Results: Recent Labs    08/10/23 0400  WBC 9.3  HGB 8.8*  HCT 27.7*  PLT 473*   BMET:  Recent Labs    08/09/23 0343 08/10/23 0400  NA 141 138  K 4.1 3.6  CL 109 106  CO2 22 21*  GLUCOSE 120* 102*  BUN 41* 40*  CREATININE 1.77* 1.49*  CALCIUM 8.3* 8.4*    PT/INR: No results for input(s): "LABPROT", "INR" in the last 72 hours. ABG    Component Value Date/Time   PHART 7.4 07/29/2023 0514   HCO3 25.8 08/02/2023 2006   TCO2 24 08/02/2023 1506   ACIDBASEDEF 1.0 08/02/2023 1506   O2SAT 94.5 08/02/2023 2006   CBG (last 3)  Recent Labs    08/09/23 1628 08/09/23 2056 08/10/23 0609  GLUCAP 142* 179* 95    Meds Scheduled Meds:  amLODipine  10 mg Oral Daily   Chlorhexidine Gluconate Cloth  6 each Topical Daily   enoxaparin (LOVENOX) injection  40 mg Subcutaneous Q24H   feeding supplement (PROSource TF20)  60 mL Per Tube TID   fiber  1 packet Per Tube TID   Gerhardt's butt cream   Topical TID    insulin aspart  0-15 Units Subcutaneous TID WC   insulin aspart  0-5 Units Subcutaneous QHS   insulin glargine  20 Units Subcutaneous BID   metoprolol tartrate  12.5 mg Oral BID   mouth rinse  15 mL Mouth Rinse 4 times per day   pantoprazole  40 mg Oral Daily   Continuous Infusions:  Vivonex RTF 1,000 mL (08/10/23 0600)   PRN Meds:.acetaminophen (TYLENOL) oral liquid 160 mg/5 mL, albuterol, fentaNYL, magic mouthwash w/lidocaine, morphine injection, ondansetron (ZOFRAN) IV, mouth rinse, mouth rinse, oxyCODONE, polyethylene glycol  Xrays DG CHEST PORT 1 VIEW Result Date: 08/08/2023 CLINICAL DATA:  History of esophagectomy. EXAM: PORTABLE CHEST 1 VIEW COMPARISON:  August 05, 2023. FINDINGS: The heart size and mediastinal contours are within normal limits. Right internal jugular Port-A-Cath is again noted. Right-sided chest tube is noted with minimal right apical pneumothorax. Mild left basilar subsegmental atelectasis is noted with possible small pleural effusion. The visualized skeletal structures are unremarkable. IMPRESSION: Stable right-sided chest tube with minimal right apical pneumothorax. Mild left basilar subsegmental atelectasis is noted with probable small left pleural effusion. Electronically Signed   By: Lupita Raider M.D.   On: 08/08/2023 12:24    Assessment/Plan: S/P Procedure(s) (LRB): XI ROBOTIC ASSISTED IVOR LEWIS ESOPHAGECTOMY (N/A) EGD (ESOPHAGOGASTRODUODENOSCOPY) (N/A)  1 afeb, VSS , NSR, BP control better on norvasc 10, likely will need his  ACE-I restarted when creat improves further or additional agent 2 O2 sats good on RA 3 drain - 40 ml/24 h recorded, serosang 4 less diarrhea amts 5 creat conts to improve, now 1.49 6 leukocytosis resolved 7 H/H improved to 8.8/27.7 8 BS control is reasonable, one reading over 200 yesterday 95-239 range- cont current insulin dosing- consider starting some of home meds soon 9 lovenox for DVT ppx 10 cont rehab/pulm hygiene 11  nutrition slowly improving - ate breakfast this am, has been getting 1/3-1/2 of meals in    LOS: 13 days    Rowe Clack PA-C Pager [phone] 08/10/2023

## 2023-08-10 NOTE — Plan of Care (Signed)
  Problem: Education: Goal: Knowledge of General Education information will improve Description: Including pain rating scale, medication(s)/side effects and non-pharmacologic comfort measures Outcome: Progressing   Problem: Health Behavior/Discharge Planning: Goal: Ability to manage health-related needs will improve Outcome: Progressing   Problem: Clinical Measurements: Goal: Ability to maintain clinical measurements within normal limits will improve Outcome: Progressing Goal: Will remain free from infection Outcome: Progressing Goal: Diagnostic test results will improve Outcome: Progressing Goal: Respiratory complications will improve Outcome: Progressing Goal: Cardiovascular complication will be avoided Outcome: Progressing   Problem: Activity: Goal: Risk for activity intolerance will decrease Outcome: Progressing   Problem: Nutrition: Goal: Adequate nutrition will be maintained Outcome: Progressing   Problem: Coping: Goal: Level of anxiety will decrease Outcome: Progressing   Problem: Elimination: Goal: Will not experience complications related to bowel motility Outcome: Progressing Goal: Will not experience complications related to urinary retention Outcome: Progressing   Problem: Pain Managment: Goal: General experience of comfort will improve and/or be controlled Outcome: Progressing   Problem: Safety: Goal: Ability to remain free from injury will improve Outcome: Progressing   Problem: Skin Integrity: Goal: Risk for impaired skin integrity will decrease Outcome: Progressing   Problem: Education: Goal: Knowledge of the prescribed therapeutic regimen will improve Outcome: Progressing   Problem: Bowel/Gastric: Goal: Gastrointestinal status for postoperative course will improve Outcome: Progressing   Problem: Nutritional: Goal: Ability to achieve adequate nutritional intake will improve Outcome: Progressing   Problem: Clinical Measurements: Goal:  Postoperative complications will be avoided or minimized Outcome: Progressing   Problem: Respiratory: Goal: Ability to maintain a clear airway will improve Outcome: Progressing   Problem: Skin Integrity: Goal: Demonstration of wound healing without infection will improve Outcome: Progressing   Problem: Education: Goal: Ability to describe self-care measures that may prevent or decrease complications (Diabetes Survival Skills Education) will improve Outcome: Progressing Goal: Individualized Educational Video(s) Outcome: Progressing   Problem: Coping: Goal: Ability to adjust to condition or change in health will improve Outcome: Progressing   Problem: Fluid Volume: Goal: Ability to maintain a balanced intake and output will improve Outcome: Progressing   Problem: Health Behavior/Discharge Planning: Goal: Ability to identify and utilize available resources and services will improve Outcome: Progressing Goal: Ability to manage health-related needs will improve Outcome: Progressing   Problem: Metabolic: Goal: Ability to maintain appropriate glucose levels will improve Outcome: Progressing   Problem: Nutritional: Goal: Maintenance of adequate nutrition will improve Outcome: Progressing Goal: Progress toward achieving an optimal weight will improve Outcome: Progressing   Problem: Skin Integrity: Goal: Risk for impaired skin integrity will decrease Outcome: Progressing   Problem: Tissue Perfusion: Goal: Adequacy of tissue perfusion will improve Outcome: Progressing

## 2023-08-11 ENCOUNTER — Other Ambulatory Visit: Payer: Self-pay | Admitting: Surgical

## 2023-08-11 LAB — GLUCOSE, CAPILLARY
Glucose-Capillary: 80 mg/dL (ref 70–99)
Glucose-Capillary: 174 mg/dL — ABNORMAL HIGH (ref 70–99)
Glucose-Capillary: 59 mg/dL — ABNORMAL LOW (ref 70–99)
Glucose-Capillary: 109 mg/dL — ABNORMAL HIGH (ref 70–99)
Glucose-Capillary: 160 mg/dL — ABNORMAL HIGH (ref 70–99)

## 2023-08-11 MED ORDER — INSULIN GLARGINE-YFGN 100 UNIT/ML ~~LOC~~ SOLN
20.0000 [IU] | Freq: Two times a day (BID) | SUBCUTANEOUS | Status: DC
  Administered 2023-08-11: 20 [IU] via SUBCUTANEOUS
  Filled 2023-08-11 (×3): qty 0.2

## 2023-08-11 MED ORDER — INSULIN GLARGINE-YFGN 100 UNIT/ML ~~LOC~~ SOLN
20.0000 [IU] | Freq: Two times a day (BID) | SUBCUTANEOUS | Status: DC
  Filled 2023-08-11 (×2): qty 0.2

## 2023-08-11 MED ORDER — TAMSULOSIN HCL 0.4 MG PO CAPS
0.4000 mg | ORAL_CAPSULE | Freq: Every day | ORAL | Status: DC
  Administered 2023-08-11 – 2023-08-19 (×9): 0.4 mg via ORAL
  Filled 2023-08-11 (×9): qty 1

## 2023-08-11 NOTE — Plan of Care (Signed)

## 2023-08-11 NOTE — Progress Notes (Signed)
 Mobility Specialist Progress Note;   08/11/23 1050  Mobility  Activity Ambulated with assistance in hallway  Level of Assistance Contact guard assist, steadying assist  Assistive Device None  Distance Ambulated (ft) 425 ft  Activity Response Tolerated well  Mobility Referral Yes  Mobility visit 1 Mobility  Mobility Specialist Start Time (ACUTE ONLY) 1050  Mobility Specialist Stop Time (ACUTE ONLY) 1100  Mobility Specialist Time Calculation (min) (ACUTE ONLY) 10 min   Pt agreeable to mobility. Required MinG assistance during ambulation for safety. Pt able to ambulate w/o RW this session, however would reach out to hallway rails if needed. VSS throughout. Only c/o feeling tired. Pt returned back to bed with all needs met. Wife in room.   Caesar Bookman Mobility Specialist Please contact via SecureChat or [employer] [phone]

## 2023-08-11 NOTE — Progress Notes (Signed)
 Brief Nutrition Follow-up:  Pt reports feeling better over the weekend. Tolerating day time feedings of Vivonex RTF at 65 ml/hr over 16 hours. Stool frequency has decreased and stool consistency has thickened.   Per chart review, leakage around J-tube site was better over the weekend. Spoke with RN this AM who reports at change of shift, dressing around J-tube site was dry. Upon RD assessment, RN rechecked dressing and leakage was noted. Skin appeared very red.   Pt continues to eat very minimally on Dysphagia 2, Nectar Thick diet. Pt continues to report that the smell of the food is terrible and he cannot bring himself to eat the food on his meal trays. Per wife, pt did eat some "beanie weanies" that were brought in from home yesterday (?safety of hot dog pieces). Noted SLP revaluated today with liberalization of liquids to thin and plan for repeat instrumental testing on next day to see if solids can be advanced. Hopefully, if diet able to be advanced to Dysphagia 3 or Regular consistency, maybe the food will start to appeal to him more.   Noted discharge plan pending, home with home health vs CIR. Concerned that Vivonex formula may not be available, may not be covered by insurance. Current TF does not meet 100% of his needs. If continue with current TF formula and pt continues to eat minimally, will need to increase rate of TF and/or increase hours of infusion to meet needs. If TF formula not available or not covered by insurance will need to trial new formula (?The [employer] Standard Vs Peptide)  Family purchased unflavored protein powder to mix with food and drinks over the weekend but forgot to bring in today. Wife plans to bring tomorrow.   Romelle Starcher MS, RDN, LDN, CNSC Registered Dietitian 3 Clinical Nutrition RD Inpatient Contact Info in Amion

## 2023-08-11 NOTE — Progress Notes (Signed)
 Physical Therapy Treatment Patient Details Name: BECKHAM CAPISTRAN MRN: [id number] DOB: Apr 08, 1956 Today's Date: 08/11/2023   History of Present Illness 68 y.o. 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 Pt with trace R ptx, + atelectasis. 3/13 worsening renal function with transfer to ICU. 3/15-3/17 intubation. PMHx: HTN, DM    PT Comments  Pt progressing well. Pt able to amb 600' without AD and contact guard. Pt reports mild SOB with SPO2 a t91% on RA, and noted decreased activity tolerance as expected from prolonged hospital stay. Pt progressing well. Educated on sitting up for all meals and walking 3x/day with RN staff/mobility tearm/therapy. Acute PT to cont to follow.    If plan is discharge home, recommend the following: Assistance with cooking/housework;Assist for transportation;Help with stairs or ramp for entrance;A little help with walking and/or transfers;A little help with bathing/dressing/bathroom   Can travel by private vehicle        Equipment Recommendations  Rolling walker (2 wheels)    Recommendations for Other Services       Precautions / Restrictions Precautions Precautions: Fall;Other (comment) Precaution/Restrictions Comments: L UQ jejunostomy, JP bulb Restrictions Weight Bearing Restrictions Per Provider Order: No     Mobility  Bed Mobility Overal bed mobility: Needs Assistance Bed Mobility: Supine to Sit, Sit to Supine     Supine to sit: Supervision, HOB elevated Sit to supine: Supervision, HOB elevated   General bed mobility comments: increased time but no physical assist    Transfers Overall transfer level: Needs assistance Equipment used: None Transfers: Sit to/from Stand Sit to Stand: Contact guard assist           General transfer comment: pt with good power up, wide base of support, contact guard for safety, mild lateral sway    Ambulation/Gait Ambulation/Gait assistance:  Contact guard assist Gait Distance (Feet): 600 Feet   Gait Pattern/deviations: Step-through pattern, Decreased stride length, Drifts right/left Gait velocity: decr     General Gait Details: trialed ambulation without AD, pt mildly unsteady with lateral sway requiring contact guard. Decreased pace but good upright posture, mild SOB, SPO2 > 91% on RA   Stairs             Wheelchair Mobility     Tilt Bed    Modified Rankin (Stroke Patients Only)       Balance Overall balance assessment: Needs assistance, Mild deficits observed, not formally tested Sitting-balance support: No upper extremity supported, Feet supported Sitting balance-Leahy Scale: Good     Standing balance support: No upper extremity supported Standing balance-Leahy Scale: Fair                              Hotel manager: No apparent difficulties  Cognition Arousal: Alert Behavior During Therapy: WFL for tasks assessed/performed   PT - Cognitive impairments: No apparent impairments                       PT - Cognition Comments: mildly anxious but eager to d/c home Following commands: Intact      Cueing Cueing Techniques: Verbal cues  Exercises      General Comments General comments (skin integrity, edema, etc.): SpO2 > 91% on RA      Pertinent Vitals/Pain Pain Assessment Pain Assessment: Faces Faces Pain Scale: Hurts a little bit Pain Location: abdominal soreness Pain Descriptors / Indicators: Sore    Home  Living                          Prior Function            PT Goals (current goals can now be found in the care plan section) Acute Rehab PT Goals Patient Stated Goal: to get better PT Goal Formulation: With patient/family Time For Goal Achievement: 08/13/23 Potential to Achieve Goals: Good Progress towards PT goals: Progressing toward goals    Frequency    Min 3X/week      PT Plan      Co-evaluation               AM-PAC PT "6 Clicks" Mobility   Outcome Measure  Help needed turning from your back to your side while in a flat bed without using bedrails?: A Little Help needed moving from lying on your back to sitting on the side of a flat bed without using bedrails?: A Little Help needed moving to and from a bed to a chair (including a wheelchair)?: A Little Help needed standing up from a chair using your arms (e.g., wheelchair or bedside chair)?: A Little Help needed to walk in hospital room?: A Little Help needed climbing 3-5 steps with a railing? : A Lot 6 Click Score: 17    End of Session   Activity Tolerance: Patient tolerated treatment well Patient left: with call bell/phone within reach;with family/visitor present;in bed;with nursing/sitter in room Nurse Communication: Mobility status PT Visit Diagnosis: Unsteadiness on feet (R26.81);Muscle weakness (generalized) (M62.81);Other abnormalities of gait and mobility (R26.89)     Time: 1610-9604 PT Time Calculation (min) (ACUTE ONLY): 16 min  Charges:    $Gait Training: 8-22 mins PT General Charges $$ ACUTE PT VISIT: 1 Visit                     Lewis Shock, PT, DPT Acute Rehabilitation Services Secure chat preferred Office #: [phone]    Iona Hansen 08/11/2023, 8:57 AM

## 2023-08-11 NOTE — TOC Progression Note (Addendum)
 Transition of Care [facility]) - Progression Note    Patient Details  Name: Seth Burch MRN: [id number] Date of Birth: 06-18-1955  [facility]) CM/SW Contact  Harriet Masson, RN Phone Number: 08/11/2023, 3:36 PM  Clinical Narrative:    Orders for home health.  This RNCM offered choice for Home  Health, Patient states he has no preference, RNCM made referral to Amy with [facility], She is able to take referral.  Home address: [address] [place], [place] [address] Notified Pam with [facility] of tube feeding order. Pam stated [facility] can't accept [facility] commercial and referred to Kaanapali with [employer].  PCP confirmed.  Ordered DME from Helen, notified April.   Amy will contact RNCM regarding patient's copay. TOC following.   Expected Discharge Plan: Home w Home Health Services Barriers to Discharge: Continued Medical Work up  Expected Discharge Plan and Services   Discharge Planning Services: CM Consult Post Acute Care Choice: IP Rehab Living arrangements for the past 2 months: Single Family Home                           HH Arranged: PT, OT, RN, Speech Therapy HH Agency: [facility] Date [facility] Agency Contacted: 08/11/23 Time HH Agency Contacted: 1536 Representative spoke with at [facility] Agency: Amy   Social Determinants of Health (SDOH) Interventions SDOH Screenings   Food Insecurity: No Food Insecurity (07/29/2023)  Housing: Low Risk  (07/29/2023)  Transportation Needs: No Transportation Needs (07/29/2023)  Utilities: Not At Risk (07/29/2023)  Depression (PHQ2-9): Low Risk  (06/12/2023)  Social Connections: Moderately Integrated (07/29/2023)  Tobacco Use: Low Risk  (07/28/2023)    Readmission Risk Interventions     No data to display

## 2023-08-11 NOTE — Progress Notes (Addendum)
      [address]       ,[place] [address]             [phone]      14 Days Post-Op Procedure(s) (LRB): XI ROBOTIC ASSISTED IVOR LEWIS ESOPHAGECTOMY (N/A) EGD (ESOPHAGOGASTRODUODENOSCOPY) (N/A)  Subjective:  Patient's diarrhea is better.  He is now having issues with voiding.  He states its very hard to get his urine flowing and when he does it isn't much.  He denies dysuria.  They do not wish to go to CIR and prefer to discharge home.  Objective: Vital signs in last 24 hours: Temp:  [96.5 F (35.8 C)-99.2 F (37.3 C)] 98.2 F (36.8 C) (03/24 0750) Pulse Rate:  [75-102] 102 (03/24 0750) Cardiac Rhythm: Ventricular tachycardia (03/24 0123) Resp:  [13-21] 17 (03/24 0750) BP: (126-146)/(70-83) 132/70 (03/24 0750) SpO2:  [95 %-99 %] 97 % (03/24 0750) Weight:  [79.7 kg] 79.7 kg (03/24 0444)  Intake/Output from previous day: 03/23 0701 - 03/24 0700 In: 2238.2 [NG/GT:2238.2] Out: 20 [Drains:20]  General appearance: alert, cooperative, and no distress Heart: regular rate and rhythm Lungs: clear to auscultation bilaterally Abdomen: soft, non-tender; bowel sounds normal; no masses,  no organomegaly Extremities: extremities normal, atraumatic, no cyanosis or edema Wound: clean and dry  Lab Results: Recent Labs    08/10/23 0400  WBC 9.3  HGB 8.8*  HCT 27.7*  PLT 473*   BMET:  Recent Labs    08/09/23 0343 08/10/23 0400  NA 141 138  K 4.1 3.6  CL 109 106  CO2 22 21*  GLUCOSE 120* 102*  BUN 41* 40*  CREATININE 1.77* 1.49*  CALCIUM 8.3* 8.4*    PT/INR: No results for input(s): "LABPROT", "INR" in the last 72 hours. ABG    Component Value Date/Time   PHART 7.4 07/29/2023 0514   HCO3 25.8 08/02/2023 2006   TCO2 24 08/02/2023 1506   ACIDBASEDEF 1.0 08/02/2023 1506   O2SAT 94.5 08/02/2023 2006   CBG (last 3)  Recent Labs    08/10/23 1602 08/10/23 2111 08/11/23 0615  GLUCAP 181* 185* 80    Assessment/Plan: S/P Procedure(s)  (LRB): XI ROBOTIC ASSISTED IVOR LEWIS ESOPHAGECTOMY (N/A) EGD (ESOPHAGOGASTRODUODENOSCOPY) (N/A)  CV- NSR, BP is improving- continue Norvasc, if creatinine is normal will resume home Altace Pulm- no acute issues, not requiring oxygen... JP drain remains in place with serous output which is low at 20 cc Renal- AKI resolved, last creatinine was down to 1.49, will repeat tomorrow GU- urinary retention, difficulty starting flo.. will stop cardura, start Flomax, bladder scan showed residual >400 cc urine.. patient does not want foley.[name] if this does not improve, unfortunately will require foley placement.. will monitor GI- diarrhea improved with change in tube feedings and banatrol.... continue tube feedings as patient's oral intake is not enough to meet calorie/protein needs.. diet is at dysphagia 2 will likely not advance in setting of recent esophgaectomy.. continue to encourage oral intake as able DM- sugars are under better control, continue insulin regimen.. if creatinine is lower tomorrow will resume home agents and stop scheduled insulin Deconditioning- patient does not wish to go to CIR.[name] they are requesting to go home.. have placed orders for H/H RN, PT/OT, SLP   LOS: 14 days    [name], PA-C 08/11/2023   Agree with above Looks better Dispo planning  Shelle Galdamez [employer]

## 2023-08-11 NOTE — Progress Notes (Signed)
 Speech Language Pathology Treatment: Dysphagia  Patient Details Name: ARIAN MURLEY MRN: [id number] DOB: [birth year] Today's Date: 08/11/2023 Time: 8657-8469 SLP Time Calculation (min) (ACUTE ONLY): 13 min  Assessment / Plan / Recommendation Clinical Impression  Patient seen for swallowing treatment with focus on readiness for diet upgrade. He is alert and pleasant, able to self feed thin liquids via cup sips with largely independent use of chin tuck with SLP providing only one cue to ensure full chin to chest. No overt s/s of aspiration observed. Patient reported desire to advance to thin liquids after today's visit. Also reported displeasure over both consistency and taste of solid pos, reporting decreased intake. Patient unable to provide specifics regarding his abilities with solids to date. Given degree of residuals noted on MBS which are increased with increasing viscosity and have the potential to impact abilities with solids, recommend advancing liquids today and then proceeding with instrumental testing next date to determine ability to also advance solids. Patient and family in agreement.    HPI HPI: Patient is a 68 y.o. male with PMH: HTN, DM. He presented [facility] for esophagectomy, pyloromyotomy, jejunostomy placed and gentle tube feedings started on 3/11. Patient with worsening renal function, low grade fevers and transferred to ICU. On 3/15 he had sudden onset of stridor and required intubation; marked false cord swelling noted. Bronchoscopy performed on 3/15 which reported thick mucus in trachea partially occluding left mainstem, suspicion or aspiration and mucus plugging with recurrent false vocal cord irritation leading to swelling, stridor, respiratory failure. He was extubated on 3/17. On 3/18, barium esophagram was completed to evaluate for an anastomotic leak and aspiration of barium liquid was reported, prompting SLP swallow evaluation order. MBS completed with D2/NTL  recommended with chin tuck trials with thin liquids.  ST f/u for education/dysphagia tx.      SLP Plan  MBS      Recommendations for follow up therapy are one component of a multi-disciplinary discharge planning process, led by the attending physician.  Recommendations may be updated based on patient status, additional functional criteria and insurance authorization.    Recommendations  Diet recommendations: Dysphagia 2 (fine chop);Thin liquid Liquids provided via: Cup Medication Administration: Whole meds with puree Supervision: Patient able to self feed;Intermittent supervision to cue for compensatory strategies Compensations: Chin tuck;Slow rate;Small sips/bites;Multiple dry swallows after each bite/sip Postural Changes and/or Swallow Maneuvers: Seated upright 90 degrees;Upright 30-60 min after meal                  Oral care BID   None Dysphagia, pharyngoesophageal phase (R13.14)     MBS   Dare Sanger MA, CCC-SLP   Athenia Rys Meryl  08/11/2023, 1:29 PM

## 2023-08-11 NOTE — Plan of Care (Signed)
  Problem: Education: Goal: Knowledge of General Education information will improve Description: Including pain rating scale, medication(s)/side effects and non-pharmacologic comfort measures Outcome: Progressing   Problem: Health Behavior/Discharge Planning: Goal: Ability to manage health-related needs will improve Outcome: Progressing   Problem: Clinical Measurements: Goal: Ability to maintain clinical measurements within normal limits will improve Outcome: Progressing Goal: Will remain free from infection Outcome: Progressing Goal: Respiratory complications will improve Outcome: Progressing Goal: Cardiovascular complication will be avoided Outcome: Progressing   Problem: Activity: Goal: Risk for activity intolerance will decrease Outcome: Progressing   Problem: Education: Goal: Knowledge of the prescribed therapeutic regimen will improve Outcome: Progressing   Problem: Coping: Goal: Ability to adjust to condition or change in health will improve Outcome: Progressing

## 2023-08-12 ENCOUNTER — Inpatient Hospital Stay (HOSPITAL_COMMUNITY)

## 2023-08-12 LAB — GLUCOSE, CAPILLARY
Glucose-Capillary: 78 mg/dL (ref 70–99)
Glucose-Capillary: 243 mg/dL — ABNORMAL HIGH (ref 70–99)
Glucose-Capillary: 214 mg/dL — ABNORMAL HIGH (ref 70–99)
Glucose-Capillary: 268 mg/dL — ABNORMAL HIGH (ref 70–99)

## 2023-08-12 LAB — BASIC METABOLIC PANEL
Anion gap: 5 (ref 5–15)
BUN: 39 mg/dL — ABNORMAL HIGH (ref 8–23)
CO2: 21 mmol/L — ABNORMAL LOW (ref 22–32)
Calcium: 7.7 mg/dL — ABNORMAL LOW (ref 8.9–10.3)
Chloride: 109 mmol/L (ref 98–111)
Creatinine, Ser: 1.68 mg/dL — ABNORMAL HIGH (ref 0.61–1.24)
GFR, Estimated: 45 mL/min — ABNORMAL LOW (ref >60)
Glucose, Bld: 85 mg/dL (ref 70–99)
Potassium: 3.5 mmol/L (ref 3.5–5.1)
Sodium: 135 mmol/L (ref 135–145)

## 2023-08-12 MED ORDER — INSULIN GLARGINE-YFGN 100 UNIT/ML ~~LOC~~ SOLN: 26.0000 [IU] | Freq: Every day | SUBCUTANEOUS | Status: DC

## 2023-08-12 MED ORDER — INSULIN GLARGINE-YFGN 100 UNIT/ML ~~LOC~~ SOLN
20.0000 [IU] | Freq: Every day | SUBCUTANEOUS | Status: DC
  Administered 2023-08-12 – 2023-08-14 (×3): 20 [IU] via SUBCUTANEOUS
  Filled 2023-08-12 (×4): qty 0.2

## 2023-08-12 MED ORDER — PHENYLEPHRINE HCL-NACL 20-0.9 MG/250ML-% IV SOLN
INTRAVENOUS | Status: AC
  Filled 2023-08-12: qty 250

## 2023-08-12 NOTE — Procedures (Signed)
 Modified Barium Swallow Study  Patient Details  Name: Seth Burch MRN: [id number] Date of Birth: [birth year]  Today's Date: 08/12/2023  Modified Barium Swallow completed.  Full report located under Chart Review in the Imaging Section.  History of Present Illness Patient is a 68 y.o. male with PMH: HTN, DM. He presented to [facility] on 07/28/23 for esophagectomy, pyloromyotomy, jejunostomy placed and gentle tube feedings started on 3/11. Patient with worsening renal function, low grade fevers and transferred to ICU. On 3/15 he had sudden onset of stridor and required intubation; marked false cord swelling noted. Bronchoscopy performed on 3/15 which reported thick mucus in trachea partially occluding left mainstem, suspicion or aspiration and mucus plugging with recurrent false vocal cord irritation leading to swelling, stridor, respiratory failure. He was extubated on 3/17. On 3/18, barium esophagram was completed to evaluate for an anastomotic leak and aspiration of barium liquid was reported, prompting SLP swallow evaluation order. MBS completed with D2/NTL recommended with chin tuck trials with thin liquids.  MBS on 3/19 recommending Dys 2, nectar thick liquids and thin liquids in between meals with chin tuck strategy. Repeat MBS ordered to determine patient's readiness to advance with solids.   Clinical Impression Patient presents with significant improvement in swallow function as compared to previous MBS on 08/06/23. During today's MBS, barium consistencies of thin liquid, puree solid,32mm barium tablet and regular texture solids were assessed. Patient with improved anterior hyoid excursion, laryngeal elevation and laryngeal vestibular closure. Thin liquids tested with and without chin tuck and patient did not exhibit any instances of penetration or aspiration with head in neutral position or in chin tuck position. Pharyngeal residuals in vallecular sinus and pyriform sinus were decreased in amount as  compared to prior MBS and subsequent swallows helped clear residuals from mild to trace. 13mm barium tablet taken with puree solids transited through PES and upper esophagus without delay. Tablet transit did slow down in thoracic portion of esophagus but fully transited with sips of liquids and spoon of puree. SLP is recommending to advance diet to regular texture solids, thin liquids. Patient educated on strategies to swallow an extra time to clear residuals and to alternate sips and bites. Factors that may increase risk of adverse event in presence of aspiration Rubye Oaks & Clearance Coots 2021):    Swallow Evaluation Recommendations Recommendations: PO diet PO Diet Recommendation: Regular;Thin liquids (Level 0) Liquid Administration via: Cup;Straw Medication Administration: Whole meds with puree Supervision: Patient able to self-feed Swallowing strategies  : Slow rate;Small bites/sips;Follow solids with liquids Postural changes: Position pt fully upright for meals Oral care recommendations: Oral care BID (2x/day);Pt independent with oral care   Angela Nevin, MA, CCC-SLP Speech Therapy

## 2023-08-12 NOTE — Progress Notes (Signed)
 SLP Cancellation Note  Patient Details Name: Seth Burch MRN: [id number] DOB: 03/25/1956   Cancelled treatment:       Reason Eval/Treat Not Completed: Medical issues which prohibited therapy;Other (comment) (RN reported to radiology technologist that patient is running a fever and requested to cancel MBS today and reschedule for tomorrow.)   Angela Nevin, MA, CCC-SLP Speech Therapy

## 2023-08-12 NOTE — Progress Notes (Addendum)
      [address]       [place] [address]             [phone]      15 Days Post-Op Procedure(s) (LRB): XI ROBOTIC ASSISTED IVOR LEWIS ESOPHAGECTOMY (N/A) EGD (ESOPHAGOGASTRODUODENOSCOPY) (N/A)  Subjective:  Patient doing well.  He continues to have poor oral intake mainly due to smells of food not being palatable.  His stool is starting to thicken up some.  He has ambulated. He refused insulin this morning as sugars have been low  Objective: Vital signs in last 24 hours: Temp:  [98 F (36.7 C)-99.8 F (37.7 C)] 99 F (37.2 C) (03/25 0611) Pulse Rate:  [85-102] 95 (03/25 0611) Cardiac Rhythm: Normal sinus rhythm (03/24 1900) Resp:  [17-26] 26 (03/25 0611) BP: (129-141)/(70-73) 131/70 (03/25 0611) SpO2:  [93 %-97 %] 93 % (03/25 0611) Weight:  [78.8 kg] 78.8 kg (03/25 0500)  Intake/Output from previous day: 03/24 0701 - 03/25 0700 In: 1502.4 [P.O.:480; NG/GT:902.4] Out: 343 [Urine:300; Drains:40; Stool:3]  General appearance: alert, cooperative, and no distress Heart: regular rate and rhythm Lungs: clear to auscultation bilaterally Abdomen: soft, non-tender; bowel sounds normal; no masses,  no organomegaly Extremities: extremities normal, atraumatic, no cyanosis or edema Wound: clean and dry, purulence improved around J Tube  Lab Results: Recent Labs    08/10/23 0400  WBC 9.3  HGB 8.8*  HCT 27.7*  PLT 473*   BMET:  Recent Labs    08/10/23 0400 08/12/23 0615  NA 138 135  K 3.6 3.5  CL 106 109  CO2 21* 21*  GLUCOSE 102* 85  BUN 40* 39*  CREATININE 1.49* 1.68*  CALCIUM 8.4* 7.7*    PT/INR: No results for input(s): "LABPROT", "INR" in the last 72 hours. ABG    Component Value Date/Time   PHART 7.4 07/29/2023 0514   HCO3 25.8 08/02/2023 2006   TCO2 24 08/02/2023 1506   ACIDBASEDEF 1.0 08/02/2023 1506   O2SAT 94.5 08/02/2023 2006   CBG (last 3)  Recent Labs    08/11/23 1650 08/11/23 2127 08/12/23 0609  GLUCAP 109* 160* 78     Assessment/Plan: S/P Procedure(s) (LRB): XI ROBOTIC ASSISTED IVOR LEWIS ESOPHAGECTOMY (N/A) EGD (ESOPHAGOGASTRODUODENOSCOPY) (N/A)  CV- NSR, BP remains slightly elevated- continue Lopressor, Norvasc.Josefa Half okay with slightly elevated BP in setting of elevated creatinine Pulm- no acute issues, JP drain remains in place with minimal serous drainage Renal- creatinine up to 1.69, suspect due to urinary retention over past few days, will recheck in AM.. GU- urinary retention improved, continue Flomax GI- continue tube feeds at current regimen, I have tried to ensure dietary comes and takes patient's order to aim at food he is willing to eat, plan will be to keep on dysphagia 2 diet for at least 1 more week to ensure no increase pressure on anastamosis.. DM-hypoglycemic at times, will continue SSIP, decrease semeglee to home regimen Deconditioning- home PT/OT, RN, SLP ordered.[name] arrangements attempting to be made, however per CM this may be difficult with patient's insurance and location of home   LOS: 15 days    [name], PA-C 08/12/2023   Agree with above Dispo planning  Tyronne Blann Keane Scrape

## 2023-08-12 NOTE — Progress Notes (Signed)
 Occupational Therapy Treatment Patient Details Name: DIONTE BLAUSTEIN MRN: [id number] DOB: 06/01/1955 Today's Date: 08/12/2023   History of present illness 68 y.o. 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 Pt with trace R ptx, + atelectasis. 3/13 worsening renal function with transfer to ICU. 3/15-3/17 intubation. PMHx: HTN, DM   OT comments  Pt making good progress towards OT goals. Session focused on bedside bathing and BSC transfers. Pt reporting new R shoulder pain (reports from sleeping on it wrong) so did require intermittent Min A for ADL completion. Anticipate as R shoulder pain improves, that pt will be able to manage ADLs w/o physical assistance. Wife at bedside and supportive.      If plan is discharge home, recommend the following:  A little help with bathing/dressing/bathroom;Assistance with cooking/housework;Assist for transportation;Help with stairs or ramp for entrance   Equipment Recommendations  BSC/3in1    Recommendations for Other Services      Precautions / Restrictions Precautions Precautions: Fall;Other (comment) Precaution/Restrictions Comments: L UQ jejunostomy, JP bulb Restrictions Weight Bearing Restrictions Per Provider Order: No       Mobility Bed Mobility Overal bed mobility: Modified Independent Bed Mobility: Supine to Sit, Sit to Supine                Transfers Overall transfer level: Needs assistance Equipment used: None Transfers: Sit to/from Stand, Bed to chair/wheelchair/BSC Sit to Stand: Supervision     Step pivot transfers: Supervision     General transfer comment: to/from [facility]     Balance Overall balance assessment: Needs assistance, Mild deficits observed, not formally tested Sitting-balance support: No upper extremity supported, Feet supported Sitting balance-Leahy Scale: Good     Standing balance support: No upper extremity supported Standing balance-Leahy  Scale: Fair Standing balance comment: Fair+                           ADL either performed or assessed with clinical judgement   ADL Overall ADL's : Needs assistance/impaired     Grooming: Set up;Sitting;Wash/dry face   Upper Body Bathing: Set up;Sitting   Lower Body Bathing: Minimal assistance;Sitting/lateral leans;Sit to/from stand Lower Body Bathing Details (indicate cue type and reason): Min A for posterior region thoroughness due to R shoulder pain with reaching. able to cross LE to wash legs easily EOB Upper Body Dressing : Set up;Sitting       Toilet Transfer: Supervision/safety;Stand-pivot;BSC/3in1 Toilet Transfer Details (indicate cue type and reason): upon standing, needed to quickly use BSC. SPV for line safety to step to/from [facility] Toileting- Clothing Manipulation and Hygiene: Supervision/safety;Sit to/from stand;Sitting/lateral lean Toileting - Clothing Manipulation Details (indicate cue type and reason): able to manage hygiene and butt cream application without assist. fatigued and increased R shoulder pain requiring OT assist for bathing w/ washcloth            Extremity/Trunk Assessment Upper Extremity Assessment Upper Extremity Assessment: Right hand dominant;RUE deficits/detail RUE Deficits / Details: reports shoulder soreness likely from sleeping on it wrong overnight. head patch applied prior to session and pain meds given during session   Lower Extremity Assessment Lower Extremity Assessment: Defer to PT evaluation        Vision   Vision Assessment?: No apparent visual deficits   Perception     Praxis     Communication Communication Communication: No apparent difficulties   Cognition Arousal: Alert Behavior During Therapy: WFL for tasks assessed/performed Cognition: No apparent  impairments                               Following commands: Intact        Cueing   Cueing Techniques: Verbal cues  Exercises       Shoulder Instructions       General Comments Wife at bedside and supportive. Discussed options to cover tube site for showering at home w/ both familiar since they previously had to cover one of pt's ports    Pertinent Vitals/ Pain       Pain Assessment Pain Assessment: Faces Faces Pain Scale: Hurts little more Pain Location: R shoulder with movement Pain Descriptors / Indicators: Sore Pain Intervention(s): Monitored during session, Limited activity within patient's tolerance, Premedicated before session, RN gave pain meds during session  Home Living                                          Prior Functioning/Environment              Frequency  Min 1X/week        Progress Toward Goals  OT Goals(current goals can now be found in the care plan section)  Progress towards OT goals: Progressing toward goals  Acute Rehab OT Goals Patient Stated Goal: regain appetite OT Goal Formulation: With patient Time For Goal Achievement: 08/15/23 Potential to Achieve Goals: Good ADL Goals Pt Will Perform Grooming: with supervision;standing Pt Will Perform Lower Body Bathing: with supervision;sit to/from stand;sitting/lateral leans Pt Will Perform Upper Body Dressing: with modified independence;sitting Pt Will Perform Lower Body Dressing: with modified independence;sit to/from stand;sitting/lateral leans Pt Will Transfer to Toilet: with supervision;regular height toilet;ambulating Pt Will Perform Toileting - Clothing Manipulation and hygiene: sit to/from stand;sitting/lateral leans;with modified independence  Plan      Co-evaluation                 AM-PAC OT "6 Clicks" Daily Activity     Outcome Measure   Help from another person eating meals?: A Little Help from another person taking care of personal grooming?: A Little Help from another person toileting, which includes using toliet, bedpan, or urinal?: A Little Help from another person bathing  (including washing, rinsing, drying)?: A Little Help from another person to put on and taking off regular upper body clothing?: A Little Help from another person to put on and taking off regular lower body clothing?: A Little 6 Click Score: 18    End of Session    OT Visit Diagnosis: Unsteadiness on feet (R26.81);Other abnormalities of gait and mobility (R26.89);Muscle weakness (generalized) (M62.81);Other (comment)   Activity Tolerance Patient tolerated treatment well   Patient Left in bed;with bed alarm set;with family/visitor present   Nurse Communication Mobility status        Time: 0981-1914 OT Time Calculation (min): 32 min  Charges: OT General Charges $OT Visit: 1 Visit OT Treatments $Self Care/Home Management : 23-37 mins  Bradd Canary, OTR/L Acute Rehab Services Office: [phone]   Lorre Munroe 08/12/2023, 10:41 AM

## 2023-08-12 NOTE — Progress Notes (Signed)
 Nutrition Follow-up  DOCUMENTATION CODES:   Non-severe (moderate) malnutrition in context of chronic illness  INTERVENTION:   Encouraged pt to try protein powder (brought in by family) in any beverage today  Given po intake remains poor with wt loss, RD suggests increasing TF regimen as current regimen only providing about 50% of calories (currently pro-source providing additional calories and protein). Options were discussed in detail with pt and his wife today; RD also discussed with RN. Recommendations below; pt not ready to make decision but to let RN know later today if he decides or RD will follow-up in AM. Also awaiting MBS results as diet progression to Dysphagia 3 would allow more options on meal trays that might be more appealing to pt.   Tube Feeding via J-tube: Options to meet nutritional needs 1) Increase Rate  and Length of   Infusion Vivonex RTF TF infusion Vivonex at 90 ml/hr x 24 hours provides 2160 kcals, 108 g of protein and 1831 mL of free water If continue at 16 hour feedings, Vivonex rate would have to be increased to 135 mL/hr which high possibility that pt would not be able to tolerate this high rate.  With Vivonex formula, will also likely need to supplement with MCT oil (if po intake remains minimal) to prevent fatty acid deficiency as Vivonex is very low in fat (10% calories from fat)  2) Change Formula Suggested trial of Molli Posey Peptide 1.5: if pt agreeable to trial of different formula, plan again for day time feedings initially to assess for tolerance Goal Regimen:  16 hour: Molli Posey Peptide 1.5 at 90 ml/hr (provides 2160 kcals, 107 g of protein and 1008 mL of free water) 24 hour: Molli Posey Peptide 1.5 at 60 ml/hr (provides same as 16 hour feeding)   NUTRITION DIAGNOSIS:   Moderate Malnutrition related to chronic illness (esophageal cancer s/p chemo, radiation and surgery) as evidenced by percent weight loss, mild fat depletion, moderate muscle  depletion.  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  PMH includes DM, HTN, dyslipidemia, CKD, IgG kappa MGUS  3/10 EGD, XI Robotic Assisted Ivor Lewis Esophagectomy, J-tube placement 3/11 Trickle TF initiated 3/12 Surgery increased TF to 40 ml/hr 3/13 Surgery increased TF to 55 ml/hr, transferred to ICU  3/14 Surgery increased TF to 65 ml/hr (goal), bowel regimen added and +BM 3/15 Intubated, Bronch (Thick mucus in trachea suctioned, partially occluding L mainstem, ?aspiration, recurrent false vocal cord irritation with swelling leading to stridor and resp failure) 3/17 Extubated 3/18 Esophogram:  Brisk tracheal aspiration of ingested contrast, into the LEFT mainstem bronchus. SLP eval recommended. Stagnant contrast at diaphragmatic hiatus. No anastomotic leak.  3/19 Diet advanced to Dysphagia 2/Thins, later downgraded to Nectar Thick Liquids 3/20 Transition to Nocturnal TF, family to bring protein shake in from home 3/21 Significant diarrhea overnight, (9 stools in 10 hours), significant J-tube drainage, change to Vivonex with day time feedings 3/24 Diet changed to Dysphagia 2, Thins per SLP  Pt with low grade fever overnight and this morning. Reports pain at shoulder as  well. Improved with tylenol. Pt continues to not eat well Wife brought in protein powder but pt has not tried it yet. Encouraged pt to try in any liquid this afternoon. Pt was supposed to go for MBS this AM but was postponed til this afternoon.  No recorded  po intake since 3/12. Noted pt with tomato soup on lunch tray but nothing else.   Vivonex RTF continues at 65 ml/hr x 16 hours via J-tube. Pt appears to be tolerating the elemental formula that is low in fat better than he tolerated the Osmolite. However, it will be difficult to meet 100% of pt's nutritional needs with Vivonex, especially if trying to avoid 24 hour feeding. Regardless, rate of TF will need to be increased to meet nutritional needs.   Leakage continues around J-tube site; RN reports it appears to be worsen with any movement.   Noted hypoglycemic even yesterday around 430 pm, while TF infusing. CBGs have ranged from 59-243 over the past 24 hours. Current regimen is sliding scale with meals and bedtime with 20 units of semglee daily  Weight today 78.8 kg; admission wt 81.6 kg in AM but 90 kg in afternoon. Noted on admission, pt indicating recent wt closer to 180 pounds; current wt 174 pounds so regardless it appears that pt has lost weight this admission.    Pt with only 1 BM yesterday, no BM so far today. Remains on nutrisource fiber, recommend backing off on this  Labs: BUN 39 Creatinine 1.68 Sodium 135 (wdl) Potassium 3.5 (wdl) CBGs 59-243  Meds:  Nutrisource fiber Pro-Source SS novolog Semglee Protonix   Diet Order:   Diet Order             DIET DYS 2 Room service appropriate? Yes with Assist; Fluid consistency: Thin  Diet effective now                   EDUCATION NEEDS:   Education needs have been addressed  Skin:  Skin Assessment: Skin Integrity Issues: Skin Integrity Issues:: Other (Comment) Incisions: new chest tube, new J-tube (07/28/2022) Other: Leaking around J-tube  Last BM:  3/21 9 type 7 stools in 10 hours overnight on nocturnal TF  Height:   Ht Readings from Last 1 Encounters:  08/07/23 5\' 10"  (1.778 m)    Weight:   Wt Readings from Last 1 Encounters:  08/12/23 78.8 kg     BMI:  Body mass index is 24.94 kg/m.  Estimated  Nutritional Needs:   Kcal:  2200-2400 kcals  Protein:  115-130 g  Fluid:  >/= 2L   Romelle Starcher MS, RDN, LDN, CNSC Registered Dietitian 3 Clinical Nutrition RD Inpatient Contact Info in Amion

## 2023-08-12 NOTE — Plan of Care (Signed)

## 2023-08-13 ENCOUNTER — Inpatient Hospital Stay (HOSPITAL_COMMUNITY)

## 2023-08-13 LAB — GLUCOSE, CAPILLARY
Glucose-Capillary: 119 mg/dL — ABNORMAL HIGH (ref 70–99)
Glucose-Capillary: 185 mg/dL — ABNORMAL HIGH (ref 70–99)
Glucose-Capillary: 198 mg/dL — ABNORMAL HIGH (ref 70–99)
Glucose-Capillary: 229 mg/dL — ABNORMAL HIGH (ref 70–99)

## 2023-08-13 LAB — URINALYSIS, ROUTINE W REFLEX MICROSCOPIC
Bilirubin Urine: NEGATIVE
Glucose, UA: NEGATIVE mg/dL
Hgb urine dipstick: NEGATIVE
Ketones, ur: NEGATIVE mg/dL
Leukocytes,Ua: NEGATIVE
Nitrite: NEGATIVE
Protein, ur: NEGATIVE mg/dL
Specific Gravity, Urine: 1.014 (ref 1.005–1.030)
pH: 5 (ref 5.0–8.0)

## 2023-08-13 LAB — BASIC METABOLIC PANEL
Anion gap: 9 (ref 5–15)
BUN: 49 mg/dL — ABNORMAL HIGH (ref 8–23)
CO2: 20 mmol/L — ABNORMAL LOW (ref 22–32)
Calcium: 8.2 mg/dL — ABNORMAL LOW (ref 8.9–10.3)
Chloride: 106 mmol/L (ref 98–111)
Creatinine, Ser: 1.88 mg/dL — ABNORMAL HIGH (ref 0.61–1.24)
GFR, Estimated: 39 mL/min — ABNORMAL LOW (ref >60)
Glucose, Bld: 135 mg/dL — ABNORMAL HIGH (ref 70–99)
Potassium: 3.6 mmol/L (ref 3.5–5.1)
Sodium: 135 mmol/L (ref 135–145)

## 2023-08-13 LAB — CBC
HCT: 24 % — ABNORMAL LOW (ref 39.0–52.0)
Hemoglobin: 7.7 g/dL — ABNORMAL LOW (ref 13.0–17.0)
MCH: 31.6 pg (ref 26.0–34.0)
MCHC: 32.1 g/dL (ref 30.0–36.0)
MCV: 98.4 fL (ref 80.0–100.0)
Platelets: 490 10*3/uL — ABNORMAL HIGH (ref 150–400)
RBC: 2.44 MIL/uL — ABNORMAL LOW (ref 4.22–5.81)
RDW: 14.1 % (ref 11.5–15.5)
WBC: 15.4 10*3/uL — ABNORMAL HIGH (ref 4.0–10.5)
nRBC: 0 % (ref 0.0–0.2)

## 2023-08-13 MED ORDER — FLUCONAZOLE IN SODIUM CHLORIDE 200-0.9 MG/100ML-% IV SOLN
200.0000 mg | Freq: Every day | INTRAVENOUS | Status: DC
  Administered 2023-08-13: 200 mg via INTRAVENOUS
  Filled 2023-08-13 (×2): qty 100

## 2023-08-13 MED ORDER — ACETAMINOPHEN 650 MG RE SUPP: 650.0000 mg | RECTAL | Status: DC | PRN

## 2023-08-13 MED ORDER — OXYCODONE HCL 5 MG PO TABS: 5.0000 mg | ORAL_TABLET | ORAL | Status: DC | PRN

## 2023-08-13 MED ORDER — METOPROLOL TARTRATE 25 MG/10 ML ORAL SUSPENSION
12.5000 mg | Freq: Two times a day (BID) | ORAL | Status: DC
  Administered 2023-08-14 – 2023-08-19 (×11): 12.5 mg
  Filled 2023-08-13 (×11): qty 5

## 2023-08-13 MED ORDER — SODIUM CHLORIDE 0.45 % IV SOLN
INTRAVENOUS | Status: AC
Start: 2023-08-13 — End: 2023-08-14

## 2023-08-13 MED ORDER — SODIUM CHLORIDE 0.9 % IV SOLN
1.0000 g | Freq: Two times a day (BID) | INTRAVENOUS | Status: DC
  Administered 2023-08-13 (×2): 1 g via INTRAVENOUS
  Filled 2023-08-13 (×3): qty 20

## 2023-08-13 MED ORDER — IOHEXOL 300 MG/ML  SOLN
50.0000 mL | Freq: Once | INTRAMUSCULAR | Status: AC | PRN
  Administered 2023-08-13: 50 mL via ORAL

## 2023-08-13 MED ORDER — ACETAMINOPHEN 160 MG/5ML PO SOLN
650.0000 mg | Freq: Four times a day (QID) | ORAL | Status: DC | PRN
  Administered 2023-08-18: 650 mg
  Filled 2023-08-13: qty 20.3

## 2023-08-13 NOTE — Progress Notes (Signed)
 Pharmacy Antibiotic Note  Seth Burch is a 68 y.o. male admitted on 07/28/2023 with esophageal leak S/P Esophagectomy .  Pharmacy has been consulted for meropenem and fluconazole dosing.  Plan: Meropenem 1G IV q12 hours Fluconazole 200mg  IV every day Monitor clinical progression  Height: 5\' 10"  (177.8 cm) Weight: 79.7 kg (175 lb 11.3 oz) IBW/kg (Calculated) : 73  Temp (24hrs), Avg:99.5 F (37.5 C), Min:98.5 F (36.9 C), Max:100.2 F (37.9 C)  Recent Labs  Lab 08/07/23 0500 08/08/23 0334 08/09/23 0343 08/10/23 0400 08/12/23 0615 08/13/23 0540  WBC 12.8*  --   --  9.3  --   --   CREATININE 2.03* 1.97* 1.77* 1.49* 1.68* 1.88*    Estimated Creatinine Clearance: 39.9 mL/min (A) (by C-G formula based on SCr of 1.88 mg/dL (H)).    No Known Allergies  Antimicrobials this admission: Zosyn 3/15>> 3/20 Vancomycin 3/15 >> 3/17 Fluconazole 3/17 >> 3/19; 3/26>> Meropenem 3/26>>   Thank you for allowing pharmacy to be a part of this patient's care.  Seth Burch 08/13/2023 8:09 AM

## 2023-08-13 NOTE — Plan of Care (Signed)
  Problem: Education: Goal: Knowledge of General Education information will improve Description: Including pain rating scale, medication(s)/side effects and non-pharmacologic comfort measures Outcome: Progressing   Problem: Health Behavior/Discharge Planning: Goal: Ability to manage health-related needs will improve Outcome: Progressing   Problem: Clinical Measurements: Goal: Ability to maintain clinical measurements within normal limits will improve Outcome: Progressing Goal: Will remain free from infection Outcome: Progressing Goal: Diagnostic test results will improve Outcome: Progressing Goal: Respiratory complications will improve Outcome: Progressing Goal: Cardiovascular complication will be avoided Outcome: Progressing   Problem: Activity: Goal: Risk for activity intolerance will decrease Outcome: Progressing   Problem: Nutrition: Goal: Adequate nutrition will be maintained Outcome: Progressing   Problem: Coping: Goal: Level of anxiety will decrease Outcome: Progressing   Problem: Elimination: Goal: Will not experience complications related to bowel motility Outcome: Progressing Goal: Will not experience complications related to urinary retention Outcome: Progressing   Problem: Pain Managment: Goal: General experience of comfort will improve and/or be controlled Outcome: Progressing   Problem: Safety: Goal: Ability to remain free from injury will improve Outcome: Progressing   Problem: Skin Integrity: Goal: Risk for impaired skin integrity will decrease Outcome: Progressing   Problem: Education: Goal: Knowledge of the prescribed therapeutic regimen will improve Outcome: Progressing   Problem: Bowel/Gastric: Goal: Gastrointestinal status for postoperative course will improve Outcome: Progressing   Problem: Nutritional: Goal: Ability to achieve adequate nutritional intake will improve Outcome: Progressing   Problem: Clinical Measurements: Goal:  Postoperative complications will be avoided or minimized Outcome: Progressing   Problem: Respiratory: Goal: Ability to maintain a clear airway will improve Outcome: Progressing   Problem: Skin Integrity: Goal: Demonstration of wound healing without infection will improve Outcome: Progressing   Problem: Education: Goal: Ability to describe self-care measures that may prevent or decrease complications (Diabetes Survival Skills Education) will improve Outcome: Progressing Goal: Individualized Educational Video(s) Outcome: Progressing   Problem: Coping: Goal: Ability to adjust to condition or change in health will improve Outcome: Progressing   Problem: Fluid Volume: Goal: Ability to maintain a balanced intake and output will improve Outcome: Progressing   Problem: Health Behavior/Discharge Planning: Goal: Ability to identify and utilize available resources and services will improve Outcome: Progressing Goal: Ability to manage health-related needs will improve Outcome: Progressing   Problem: Metabolic: Goal: Ability to maintain appropriate glucose levels will improve Outcome: Progressing   Problem: Nutritional: Goal: Maintenance of adequate nutrition will improve Outcome: Progressing Goal: Progress toward achieving an optimal weight will improve Outcome: Progressing   Problem: Skin Integrity: Goal: Risk for impaired skin integrity will decrease Outcome: Progressing   Problem: Tissue Perfusion: Goal: Adequacy of tissue perfusion will improve Outcome: Progressing

## 2023-08-13 NOTE — Progress Notes (Addendum)
      [address]       [place] [address]             [phone]      16 Days Post-Op Procedure(s) (LRB): XI ROBOTIC ASSISTED IVOR LEWIS ESOPHAGECTOMY (N/A) EGD (ESOPHAGOGASTRODUODENOSCOPY) (N/A)  Subjective:  Patient states had a better day yesterday.  Was able to eat a little bit.  Bowel movements have slowed  Objective: Vital signs in last 24 hours: Temp:  [98.5 F (36.9 C)-100.2 F (37.9 C)] 99.8 F (37.7 C) (03/26 0219) Pulse Rate:  [93-108] 106 (03/26 0219) Cardiac Rhythm: Normal sinus rhythm (03/26 0722) Resp:  [18-25] 18 (03/26 0219) BP: (100-148)/(59-74) 148/73 (03/26 0219) SpO2:  [94 %-97 %] 95 % (03/26 0219) Weight:  [79.7 kg] 79.7 kg (03/26 0500)  Intake/Output from previous day: 03/25 0701 - 03/26 0700 In: 1375.2 [P.O.:360; NG/GT:925.2] Out: 65 [Drains:65]  General appearance: alert, cooperative, and no distress Heart: regular rate and rhythm and Tachy Lungs: clear to auscultation bilaterally Abdomen: soft, non-tender; bowel sounds normal; no masses,  no organomegaly Extremities: extremities normal, atraumatic, no cyanosis or edema Wound: clean and dry, JP drain with cloudy, murky fluid  Lab Results: No results for input(s): "WBC", "HGB", "HCT", "PLT" in the last 72 hours. BMET:  Recent Labs    08/12/23 0615 08/13/23 0540  NA 135 135  K 3.5 3.6  CL 109 106  CO2 21* 20*  GLUCOSE 85 135*  BUN 39* 49*  CREATININE 1.68* 1.88*  CALCIUM 7.7* 8.2*    PT/INR: No results for input(s): "LABPROT", "INR" in the last 72 hours. ABG    Component Value Date/Time   PHART 7.4 07/29/2023 0514   HCO3 25.8 08/02/2023 2006   TCO2 24 08/02/2023 1506   ACIDBASEDEF 1.0 08/02/2023 1506   O2SAT 94.5 08/02/2023 2006   CBG (last 3)  Recent Labs    08/12/23 1632 08/12/23 2109 08/13/23 0612  GLUCAP 214* 268* 119*    Assessment/Plan: S/P Procedure(s) (LRB): XI ROBOTIC ASSISTED IVOR LEWIS ESOPHAGECTOMY (N/A) EGD  (ESOPHAGOGASTRODUODENOSCOPY) (N/A)   1. CV- Sinus Tachy, BP remains stable- on Norvasc, Lopressor 2. Pulm- not requiring oxygen, tachypneic at times.. will get CXR..  JP drain is murky this is new may ultimately benefit from CT chest 3. Renal- creatinine is rising, up to 1.88... will start IV hydration.. may be due to new onset infectious process 4. GU-urinary retention resolved, on Flomax 5. ID- febrile.. patient with persistent fever.. will send UA in setting of recent urinary retention.. however with JP drain having murky output I am concerned about delayed anastomotic leak.. will discuss repeat esophogram vs. CT CAP with Dr. Cliffton Asters.. CBC has been ordered and out of abundance of caution will resume ABX.[name] spoke with pharmacist who recommends Miripenem as he completed a course of Zosyn previously 6. GI- patient told to not eat breakfast, SLP says aspiration risk has resolved and he can tolerate regular liquids/diet at this point.. however concern for leak.... will make NPO for now, continue tube feedings 7. Deconditioning- continue PT/OT home health arrangements being made   Discussed with Dr. Cliffton Asters will repeat Esophogram add Diflucan to Mirapenem.[name] agrees with NPO status for now  LOS: 16 days   [name], PA-C 08/13/2023  Agree with above Swallow today If leak, will place stent  [name]

## 2023-08-13 NOTE — Progress Notes (Signed)
 Brief Nutrition Note:   NPO today due to concern for possible leak   Plan to increase Vivonex RTF at 65 ml/hr for 24 hours for now. Further recommendations on follow-up  Romelle Starcher MS, RDN, LDN, CNSC Registered Dietitian 3 Clinical Nutrition RD Inpatient Contact Info in [place]'

## 2023-08-13 NOTE — TOC Progression Note (Signed)
 Transition of Care [facility]) - Progression Note    Patient Details  Name: Seth Burch MRN: [id number] Date of Birth: 02-Feb-1956  Transition [facility] Care [facility]) CM/SW Contact  Lawerance Sabal, RN Phone Number: 08/13/2023, 8:08 AM  Clinical Narrative:     Sherron Monday w Laqueta Due PA to discuss needs for home tube feeds. Workup still in process, formula and or rate still subject to change. TOC will hold off for now, per Erin, ordering the supplies until final determination on feeds is made.  Please secure chat TOC as early as possible once feeds determined so home set up can be completed.   Expected Discharge Plan: Home w Home Health Services Barriers to Discharge: Continued Medical Work up  Expected Discharge Plan and Services   Discharge Planning Services: CM Consult Post Acute Care Choice: IP Rehab Living arrangements for the past 2 months: Single Family Home                 DME Arranged: Walker rolling, Bedside commode DME Agency: [employer] Date DME Agency Contacted: 08/11/23 Time DME Agency Contacted: 1549 Representative spoke with at DME Agency: April HH Arranged: PT, OT, RN, Speech Therapy HH Agency: [facility] Date [facility] Agency Contacted: 08/11/23 Time HH Agency Contacted: 1536 Representative spoke with at [facility] Agency: Amy   Social Determinants of Health (SDOH) Interventions SDOH Screenings   Food Insecurity: No Food Insecurity (07/29/2023)  Housing: Low Risk  (07/29/2023)  Transportation Needs: No Transportation Needs (07/29/2023)  Utilities: Not At Risk (07/29/2023)  Depression (PHQ2-9): Low Risk  (06/12/2023)  Social Connections: Moderately Integrated (07/29/2023)  Tobacco Use: Low Risk  (07/28/2023)    Readmission Risk Interventions     No data to display

## 2023-08-13 NOTE — Progress Notes (Signed)
 PT Cancellation Note  Patient Details Name: CYRIS MAALOUF MRN: [id number] DOB: 1955/12/20   Cancelled Treatment:    Reason Eval/Treat Not Completed: (P) Other (comment), pt declining all mobility stating he has had a tough morning. Agreeable for this PTA to check back in afternoon. Will check back as schedule allows to continue with PT POC.  Lenora Boys. PTA Acute Rehabilitation Services Office: [phone]    Catalina Antigua 08/13/2023, 11:08 AM

## 2023-08-14 LAB — BASIC METABOLIC PANEL WITH GFR
Anion gap: 8 (ref 5–15)
BUN: 48 mg/dL — ABNORMAL HIGH (ref 8–23)
CO2: 21 mmol/L — ABNORMAL LOW (ref 22–32)
Calcium: 8 mg/dL — ABNORMAL LOW (ref 8.9–10.3)
Chloride: 107 mmol/L (ref 98–111)
Creatinine, Ser: 1.76 mg/dL — ABNORMAL HIGH (ref 0.61–1.24)
GFR, Estimated: 42 mL/min — ABNORMAL LOW (ref >60)
Glucose, Bld: 174 mg/dL — ABNORMAL HIGH (ref 70–99)
Potassium: 3.8 mmol/L (ref 3.5–5.1)
Sodium: 136 mmol/L (ref 135–145)

## 2023-08-14 LAB — CBC
HCT: 24.6 % — ABNORMAL LOW (ref 39.0–52.0)
Hemoglobin: 8 g/dL — ABNORMAL LOW (ref 13.0–17.0)
MCH: 31.6 pg (ref 26.0–34.0)
MCHC: 32.5 g/dL (ref 30.0–36.0)
MCV: 97.2 fL (ref 80.0–100.0)
Platelets: 528 10*3/uL — ABNORMAL HIGH (ref 150–400)
RBC: 2.53 MIL/uL — ABNORMAL LOW (ref 4.22–5.81)
RDW: 14.1 % (ref 11.5–15.5)
WBC: 13 10*3/uL — ABNORMAL HIGH (ref 4.0–10.5)
nRBC: 0 % (ref 0.0–0.2)

## 2023-08-14 LAB — GLUCOSE, CAPILLARY
Glucose-Capillary: 196 mg/dL — ABNORMAL HIGH (ref 70–99)
Glucose-Capillary: 221 mg/dL — ABNORMAL HIGH (ref 70–99)
Glucose-Capillary: 210 mg/dL — ABNORMAL HIGH (ref 70–99)
Glucose-Capillary: 171 mg/dL — ABNORMAL HIGH (ref 70–99)

## 2023-08-14 NOTE — Progress Notes (Signed)
 Mobility Specialist Progress Note;   08/14/23 1011  Mobility  Activity Ambulated with assistance in hallway  Level of Assistance Contact guard assist, steadying assist  Assistive Device None  Distance Ambulated (ft) 400 ft  Activity Response Tolerated well  Mobility Referral Yes  Mobility visit 1 Mobility  Mobility Specialist Start Time (ACUTE ONLY) 1011  Mobility Specialist Stop Time (ACUTE ONLY) 1021  Mobility Specialist Time Calculation (min) (ACUTE ONLY) 10 min   Pt agreeable to mobility. Required MinG assistance for bed mobility and during ambulation for safety. Pt often reaching out for hallway hand rails for support. VSS throughout and no c/o when asked. Pt returned back to bed with all needs met. Wife in room.   Caesar Bookman Mobility Specialist Please contact via SecureChat or [employer] [phone]

## 2023-08-14 NOTE — Progress Notes (Addendum)
      [address]       [name] [address]             [phone]      17 Days Post-Op Procedure(s) (LRB): XI ROBOTIC ASSISTED IVOR LEWIS ESOPHAGECTOMY (N/A) EGD (ESOPHAGOGASTRODUODENOSCOPY) (N/A)  Subjective:  Patient continues to feel well.  Diarrhea continues to improve.  + ambulation  Objective: Vital signs in last 24 hours: Temp:  [98.1 F (36.7 C)-99.1 F (37.3 C)] 98.7 F (37.1 C) (03/27 0713) Pulse Rate:  [79-94] 93 (03/27 0348) Cardiac Rhythm: Normal sinus rhythm (03/26 1900) Resp:  [18-21] 19 (03/27 0713) BP: (107-134)/(63-73) 133/70 (03/27 0713) SpO2:  [94 %-98 %] 96 % (03/27 0713) Weight:  [78.7 kg] 78.7 kg (03/27 0348)  Intake/Output from previous day: 03/26 0701 - 03/27 0700 In: 1525.6 [I.V.:17.6; NG/GT:1378; IV Piggyback:100] Out: 260 [Urine:225; Drains:35]  General appearance: alert, cooperative, and no distress Heart: regular rate and rhythm Lungs: clear to auscultation bilaterally Abdomen: soft, non-tender; bowel sounds normal; no masses,  no organomegaly Extremities: extremities normal, atraumatic, no cyanosis or edema Wound: clean and dry, JP drain in place  Lab Results: Recent Labs    08/13/23 1137 08/14/23 0245  WBC 15.4* 13.0*  HGB 7.7* 8.0*  HCT 24.0* 24.6*  PLT 490* 528*   BMET:  Recent Labs    08/13/23 0540 08/14/23 0245  NA 135 136  K 3.6 3.8  CL 106 107  CO2 20* 21*  GLUCOSE 135* 174*  BUN 49* 48*  CREATININE 1.88* 1.76*  CALCIUM 8.2* 8.0*    PT/INR: No results for input(s): "LABPROT", "INR" in the last 72 hours. ABG    Component Value Date/Time   PHART 7.4 07/29/2023 0514   HCO3 25.8 08/02/2023 2006   TCO2 24 08/02/2023 1506   ACIDBASEDEF 1.0 08/02/2023 1506   O2SAT 94.5 08/02/2023 2006   CBG (last 3)  Recent Labs    08/13/23 1542 08/13/23 2106 08/14/23 0615  GLUCAP 198* 229* 196*    Assessment/Plan: S/P Procedure(s) (LRB): XI ROBOTIC ASSISTED IVOR LEWIS ESOPHAGECTOMY (N/A) EGD  (ESOPHAGOGASTRODUODENOSCOPY) (N/A)  CV- NSR, BP remains stable- continue Lopressor, Norvasc Pulm- JP drain remains in place.[name] output remains purulent...chest X-ray with fluid component right base.. leave JP drain in place Renal- creatinine improved after hydration.. monitor closely.. likely due to mild dehydration with poor oral intake GU- urinary retention resolved, continue flomax.[name] UA negative for infection ID- fever resolved, leukocytosis improving.. continue Meropenem.. Diflucan for now GI- swallow negative DM- sugars stable, continue insulin  8. Deconditioning- patient stable, working with PT/OT.. home arrangements made   LOS: 17 days   [name], PA-C 08/14/2023  Agree with above Will resume diet Dispo planning  Madasyn Heath O Nyia Tsao

## 2023-08-14 NOTE — Progress Notes (Incomplete)
 Nutrition Follow-up  DOCUMENTATION CODES:   Non-severe (moderate) malnutrition in context of chronic illness  INTERVENTION:   Encouraged pt to try protein powder (brought in by family) in any beverage today.   Provided Nutrition Therapy post Esophagectomy handout from the [employer].    Tube Feeding via J-tube: Options to meet nutritional needs 1) Increase Rate  and Length of   Infusion Vivonex RTF TF infusion Vivonex at 90 ml/hr x 24 hours provides 2160 kcals, 108 g of protein and 1831 mL of free water If continue at 16 hour feedings, Vivonex rate would have to be increased to 135 mL/hr which high possibility that pt would not be able to tolerate this high rate.  With Vivonex formula, will also likely need to supplement with MCT oil (if po intake remains minimal) to prevent fatty acid deficiency as Vivonex is very low in fat (10% calories from fat)  2) Change Formula Suggested trial of Molli Posey Peptide 1.5: if pt agreeable to trial of different formula, plan again for day time feedings initially to assess for tolerance Goal Regimen:  16 hour: Molli Posey Peptide 1.5 at 90 ml/hr (provides 2160 kcals, 107 g of protein and 1008 mL of free water) 24 hour: Molli Posey Peptide 1.5 at 60 ml/hr (provides same as 16 hour feeding)   NUTRITION DIAGNOSIS:   Moderate Malnutrition related to chronic illness (esophageal cancer s/p chemo, radiation and surgery) as evidenced by percent weight loss, mild fat depletion, moderate muscle depletion.  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  PMH includes DM, HTN, dyslipidemia, CKD, IgG kappa MGUS  3/10 EGD, XI Robotic Assisted Ivor Lewis Esophagectomy, J-tube placement 3/11 Trickle TF initiated 3/12 Surgery increased TF to 40 ml/hr 3/13 Surgery increased TF to 55 ml/hr, transferred to ICU  3/14 Surgery increased TF to 65 ml/hr (goal), bowel regimen added and +BM 3/15 Intubated, Bronch (Thick mucus in trachea suctioned, partially occluding L mainstem, ?aspiration, recurrent false vocal cord irritation with swelling leading to stridor and resp failure) 3/17 Extubated 3/18 Esophogram:  Brisk tracheal aspiration of ingested contrast, into the LEFT mainstem bronchus. SLP eval recommended. Stagnant contrast at diaphragmatic hiatus. No anastomotic leak.  3/19 Diet advanced to Dysphagia 2/Thins, later downgraded to Nectar Thick Liquids 3/20 Transition to Nocturnal TF, family to bring protein shake in from home 3/21 Significant diarrhea overnight, (9 stools in 10 hours), significant J-tube drainage, change to Vivonex with day time feedings 3/24 Diet changed to Dysphagia 2, Thins per SLP 3/26 NPO due to concern for possible esophageal leak  NPO yesterday due to concern for possible esophageal leak, JP with cloudy, purulent drainage. Repeat esophogram today Diet resume this morning, now on Dysphagia 3, Thin Liquids  No recorded po intake since 3/12. Noted pt with tomato soup on lunch tray but nothing else.   Increase Vivonex RTF infusion to 24 hours at same rate of 65 ml/hr. Pt appears to be tolerating the elemental formula that is low in fat better than he tolerated the Osmolite.  Leakage continues around  J-tube site; RN reports it appears to be worsen with any movement.   Weight relatively stable over the last several days; current wt 78.7  Pt with 2 small type 5-6 BMs yesterday, no BM so far today.   Labs: BUN 48 Creatinine 1.76 Sodium 136  (wdl) Potassium 3.8 (wdl) CBGs 119-229  Meds:  Nutrisource fiber Pro-Source SS novolog Semglee Protonix   Diet Order:   Diet Order             DIET DYS 3 Room service appropriate? Yes; Fluid consistency: Thin  Diet effective now                   EDUCATION NEEDS:   Education needs have been addressed  Skin:  Skin Assessment: Skin Integrity Issues: Skin Integrity Issues:: Other (Comment) Incisions: new chest tube, new J-tube (07/28/2022) Other: Leaking around J-tube  Last BM:  3/26  Height:   Ht Readings from Last 1 Encounters:  08/07/23 5\' 10"  (1.778 m)    Weight:   Wt Readings from Last 1 Encounters:  08/14/23 78.7 kg     BMI:  Body mass index is 24.89 kg/m.  Estimated Nutritional Needs:   Kcal:  2200-2400 kcals  Protein:  115-130 g  Fluid:  >/= 2L   Romelle Starcher MS, RDN, LDN, CNSC Registered Dietitian 3 Clinical Nutrition RD Inpatient Contact Info in [place]ion

## 2023-08-14 NOTE — Progress Notes (Signed)
 Speech Language Pathology Treatment: Dysphagia  Patient Details Name: Seth Burch MRN: [id number] DOB: April 12, 1956 Today's Date: 08/14/2023 Time: 9629-5284 SLP Time Calculation (min) (ACUTE ONLY): 10 min  Assessment / Plan / Recommendation Clinical Impression  Patient seen by SLP for skilled treatment session focused on dysphagia goals. Patient's spouse present as well. Session focused on patient education. SLP reviewed results from repeat MBS which did not show aspiration of thin liquids with or without chin tuck but did show mild pharyngeal residuals of solids and thicker liquids.  Patient educated that he can continue to perform chin tuck if he desires but it is no longer necessary to prevent penetration or aspiration or liquids. MD has advanced patient to mechanical soft solids( dys 3) and SLP informed patient and spouse that as per MBS, he could advance to regular textures when cleared by MD. SLP provided patient and spouse with printed handout describing IDDSI level 7: regular easy to chew diet as it aligns with mechanical soft solids diet here at [facility]. SLP to s/o at this time.  Patient may be advanced to regular texture solids when cleared by MD. (Does not need SLP eval to do so)     HPI HPI: Patient is a 68 y.o. male with PMH: HTN, DM. He presented to [facility] on 07/28/23 for esophagectomy, pyloromyotomy, jejunostomy placed and gentle tube feedings started on 3/11. Patient with worsening renal function, low grade fevers and transferred to ICU. On 3/15 he had sudden onset of stridor and required intubation; marked false cord swelling noted. Bronchoscopy performed on 3/15 which reported thick mucus in trachea partially occluding left mainstem, suspicion or aspiration and mucus plugging with recurrent false vocal cord irritation leading to swelling, stridor, respiratory failure. He was extubated on 3/17. On 3/18, barium esophagram was completed to evaluate for an anastomotic leak and aspiration of  barium liquid was reported, prompting SLP swallow evaluation order. MBS completed with D2/NTL recommended with chin tuck trials with thin liquids.  MBS on 3/19 recommending Dys 2, nectar thick liquids and thin liquids in between meals with chin tuck strategy. Repeat MBS ordered to determine patient's readiness to advance with solids.      SLP Plan  All goals met      Recommendations for follow up therapy are one component of a multi-disciplinary discharge planning process, led by the attending physician.  Recommendations may be updated based on patient status, additional functional criteria and insurance authorization.    Recommendations  Diet recommendations: Regular;Thin liquid Liquids provided via: Cup;Straw Medication Administration: Other (Comment) (as tolerated) Supervision: Patient able to self feed Compensations: Slow rate;Small sips/bites;Multiple dry swallows after each bite/sip;Follow solids with liquid Postural Changes and/or Swallow Maneuvers: Seated upright 90 degrees;Upright 30-60 min after meal                  Oral care BID   None Dysphagia, pharyngoesophageal phase (R13.14)     All goals met    Angela Nevin, MA, CCC-SLP Speech Therapy

## 2023-08-14 NOTE — Progress Notes (Deleted)
      [address]       [name] [address]             [phone]     Will schedule for follow up lung CT and make appt with Dr Cliffton Asters in 3 months for f/u of lung nodule.  Rowe Clack, PA-C

## 2023-08-14 NOTE — TOC Progression Note (Signed)
 Transition of Care [facility]) - Progression Note    Patient Details  Name: Seth Burch MRN: [id number] Date of Birth: 1955-11-14  [facility] CM/SW Contact  Harriet Masson, RN Phone Number: 08/14/2023, 4:27 PM  Clinical Narrative:    Marthann Schiller with [employer] notified this RNCM that Vivonex RTF tube feed formula copay will be $164.27 monthly. Patient is aware.  Tube feed order is on hold until patient is ready for discharge.    Expected Discharge Plan: Home w Home Health Services Barriers to Discharge: Continued Medical Work up  Expected Discharge Plan and Services   Discharge Planning Services: CM Consult Post Acute Care Choice: IP Rehab Living arrangements for the past 2 months: Single Family Home                 DME Arranged: Walker rolling, Bedside commode DME Agency: [employer] Date DME Agency Contacted: 08/11/23 Time DME Agency Contacted: 1549 Representative spoke with at DME Agency: April HH Arranged: PT, OT, RN, Speech Therapy HH Agency: [facility] Date [facility] Agency Contacted: 08/11/23 Time HH Agency Contacted: 1536 Representative spoke with at [facility] Agency: Amy   Social Determinants of Health (SDOH) Interventions SDOH Screenings   Food Insecurity: No Food Insecurity (07/29/2023)  Housing: Low Risk  (07/29/2023)  Transportation Needs: No Transportation Needs (07/29/2023)  Utilities: Not At Risk (07/29/2023)  Depression (PHQ2-9): Low Risk  (06/12/2023)  Social Connections: Moderately Integrated (07/29/2023)  Tobacco Use: Low Risk  (07/28/2023)    Readmission Risk Interventions     No data to display

## 2023-08-14 NOTE — Progress Notes (Signed)
 Physical Therapy Treatment Patient Details Name: Seth Burch MRN: [id number] DOB: 02-Jun-1955 Today's Date: 08/14/2023   History of Present Illness 68 y.o. 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 Pt with trace R ptx, + atelectasis. 3/13 worsening renal function with transfer to ICU. 3/15-3/17 intubation. PMHx: HTN, DM    PT Comments  Pt up in chair on arrival and agreeable to session with continued progress towards acute goals. Pt making good gains with mobility endurance however continues to be limited in safe mobility by impaired balance/postural reactions, often reaching for rail support in more distracting environment and with increased postural sway when presented with mild balance challenges. Pt requiring grossly CGA during transfers and gait without AD support this session. Plan to continue to challenge higher level balance in next sessions to increase safety with mobility. Pt continues to benefit from skilled PT services to progress toward functional mobility goals.     If plan is discharge home, recommend the following: Assistance with cooking/housework;Assist for transportation;Help with stairs or ramp for entrance;A little help with walking and/or transfers;A little help with bathing/dressing/bathroom   Can travel by private vehicle        Equipment Recommendations  Rolling walker (2 wheels)    Recommendations for Other Services       Precautions / Restrictions Precautions Precautions: Fall;Other (comment) Precaution/Restrictions Comments: L UQ jejunostomy, JP bulb Restrictions Weight Bearing Restrictions Per Provider Order: No     Mobility  Bed Mobility Overal bed mobility: Modified Independent             General bed mobility comments: up in chair on arrival    Transfers Overall transfer level: Needs assistance Equipment used: None Transfers: Sit to/from Stand, Bed to chair/wheelchair/BSC Sit to  Stand: Supervision           General transfer comment: supervision for safety    Ambulation/Gait Ambulation/Gait assistance: Contact guard assist Gait Distance (Feet): 500 Feet Assistive device: None Gait Pattern/deviations: Step-through pattern, Decreased stride length, Drifts right/left Gait velocity: decr     General Gait Details: some increased posutral sway with mild balance challenges (head turns) pt reaching out for rail PRN when pt reporting feeling "wobbly" not reports of dizziness   Stairs             Wheelchair Mobility     Tilt Bed    Modified Rankin (Stroke Patients Only)       Balance Overall balance assessment: Needs assistance, Mild deficits observed, not formally tested Sitting-balance support: No upper extremity supported, Feet supported Sitting balance-Leahy Scale: Good     Standing balance support: No upper extremity supported Standing balance-Leahy Scale: Fair Standing balance comment: no UE support during dynamic tasks             High level balance activites: Backward walking, Head turns High Level Balance Comments: mild postural sway with challenges, no overt LOB            Communication Communication Communication: No apparent difficulties  Cognition Arousal: Alert Behavior During Therapy: WFL for tasks assessed/performed   PT - Cognitive impairments: No apparent impairments                       PT - Cognition Comments: mildly anxious but eager to d/c home Following commands: Intact      Cueing Cueing Techniques: Verbal cues  Exercises      General Comments General comments (skin integrity, edema,  etc.): wife present and supportive, VSS on RA, HR 95-102 during activity      Pertinent Vitals/Pain Pain Assessment Pain Assessment: Faces Faces Pain Scale: Hurts a little bit Pain Location: abdomen Pain Descriptors / Indicators: Sore Pain Intervention(s): Monitored during session, Limited activity  within patient's tolerance    Home Living                          Prior Function            PT Goals (current goals can now be found in the care plan section) Acute Rehab PT Goals Patient Stated Goal: to get better PT Goal Formulation: With patient/family Time For Goal Achievement: 08/13/23 Progress towards PT goals: Progressing toward goals    Frequency    Min 3X/week      PT Plan      Co-evaluation              AM-PAC PT "6 Clicks" Mobility   Outcome Measure  Help needed turning from your back to your side while in a flat bed without using bedrails?: A Little Help needed moving from lying on your back to sitting on the side of a flat bed without using bedrails?: A Little Help needed moving to and from a bed to a chair (including a wheelchair)?: A Little Help needed standing up from a chair using your arms (e.g., wheelchair or bedside chair)?: A Little Help needed to walk in hospital room?: A Little Help needed climbing 3-5 steps with a railing? : A Lot 6 Click Score: 17    End of Session   Activity Tolerance: Patient tolerated treatment well Patient left: with call bell/phone within reach;with family/visitor present;in chair Nurse Communication: Mobility status PT Visit Diagnosis: Unsteadiness on feet (R26.81);Muscle weakness (generalized) (M62.81);Other abnormalities of gait and mobility (R26.89)     Time: 8119-1478 PT Time Calculation (min) (ACUTE ONLY): 15 min  Charges:    $Therapeutic Exercise: 8-22 mins PT General Charges $$ ACUTE PT VISIT: 1 Visit                     Kaspar Albornoz R. PTA Acute Rehabilitation Services Office: [phone]   Catalina Antigua 08/14/2023, 12:42 PM

## 2023-08-14 NOTE — Plan of Care (Signed)
  Problem: Education: Goal: Knowledge of General Education information will improve Description: Including pain rating scale, medication(s)/side effects and non-pharmacologic comfort measures Outcome: Progressing   Problem: Health Behavior/Discharge Planning: Goal: Ability to manage health-related needs will improve Outcome: Progressing   Problem: Clinical Measurements: Goal: Ability to maintain clinical measurements within normal limits will improve Outcome: Progressing Goal: Will remain free from infection Outcome: Progressing Goal: Diagnostic test results will improve Outcome: Progressing Goal: Respiratory complications will improve Outcome: Progressing Goal: Cardiovascular complication will be avoided Outcome: Progressing   Problem: Activity: Goal: Risk for activity intolerance will decrease Outcome: Progressing   Problem: Nutrition: Goal: Adequate nutrition will be maintained Outcome: Progressing   Problem: Coping: Goal: Level of anxiety will decrease Outcome: Progressing   Problem: Elimination: Goal: Will not experience complications related to bowel motility Outcome: Progressing Goal: Will not experience complications related to urinary retention Outcome: Progressing   Problem: Pain Managment: Goal: General experience of comfort will improve and/or be controlled Outcome: Progressing   Problem: Safety: Goal: Ability to remain free from injury will improve Outcome: Progressing   Problem: Skin Integrity: Goal: Risk for impaired skin integrity will decrease Outcome: Progressing   Problem: Education: Goal: Knowledge of the prescribed therapeutic regimen will improve Outcome: Progressing   Problem: Bowel/Gastric: Goal: Gastrointestinal status for postoperative course will improve Outcome: Progressing   Problem: Nutritional: Goal: Ability to achieve adequate nutritional intake will improve Outcome: Progressing   Problem: Clinical Measurements: Goal:  Postoperative complications will be avoided or minimized Outcome: Progressing   Problem: Respiratory: Goal: Ability to maintain a clear airway will improve Outcome: Progressing   Problem: Skin Integrity: Goal: Demonstration of wound healing without infection will improve Outcome: Progressing   Problem: Education: Goal: Ability to describe self-care measures that may prevent or decrease complications (Diabetes Survival Skills Education) will improve Outcome: Progressing Goal: Individualized Educational Video(s) Outcome: Progressing   Problem: Coping: Goal: Ability to adjust to condition or change in health will improve Outcome: Progressing   Problem: Fluid Volume: Goal: Ability to maintain a balanced intake and output will improve Outcome: Progressing   Problem: Health Behavior/Discharge Planning: Goal: Ability to identify and utilize available resources and services will improve Outcome: Progressing Goal: Ability to manage health-related needs will improve Outcome: Progressing   Problem: Metabolic: Goal: Ability to maintain appropriate glucose levels will improve Outcome: Progressing   Problem: Nutritional: Goal: Maintenance of adequate nutrition will improve Outcome: Progressing Goal: Progress toward achieving an optimal weight will improve Outcome: Progressing   Problem: Skin Integrity: Goal: Risk for impaired skin integrity will decrease Outcome: Progressing   Problem: Tissue Perfusion: Goal: Adequacy of tissue perfusion will improve Outcome: Progressing

## 2023-08-14 NOTE — TOC Progression Note (Signed)
 Transition of Care [facility]) - Progression Note    Patient Details  Name: BERLYN MALINA MRN: [id number] Date of Birth: 04-19-1956  Transition of Care [facility]) CM/SW Contact  Harriet Masson, RN Phone Number: 08/14/2023, 3:45 PM  Clinical Narrative:    Tube feeding order securely emailed to [facility] with [employer] to determine copay.  TOC following.     Expected Discharge Plan: Home w Home Health Services Barriers to Discharge: Continued Medical Work up  Expected Discharge Plan and Services   Discharge Planning Services: CM Consult Post Acute Care Choice: IP Rehab Living arrangements for the past 2 months: Single Family Home                 DME Arranged: Walker rolling, Bedside commode DME Agency: [employer] Date DME Agency Contacted: 08/11/23 Time DME Agency Contacted: 1549 Representative spoke with at DME Agency: April HH Arranged: PT, OT, RN, Speech Therapy HH Agency: [facility] Date [facility] Agency Contacted: 08/11/23 Time HH Agency Contacted: 1536 Representative spoke with at [facility] Agency: Amy   Social Determinants of Health (SDOH) Interventions SDOH Screenings   Food Insecurity: No Food Insecurity (07/29/2023)  Housing: Low Risk  (07/29/2023)  Transportation Needs: No Transportation Needs (07/29/2023)  Utilities: Not At Risk (07/29/2023)  Depression (PHQ2-9): Low Risk  (06/12/2023)  Social Connections: Moderately Integrated (07/29/2023)  Tobacco Use: Low Risk  (07/28/2023)    Readmission Risk Interventions     No data to display

## 2023-08-15 LAB — GLUCOSE, CAPILLARY
Glucose-Capillary: 232 mg/dL — ABNORMAL HIGH (ref 70–99)
Glucose-Capillary: 220 mg/dL — ABNORMAL HIGH (ref 70–99)
Glucose-Capillary: 208 mg/dL — ABNORMAL HIGH (ref 70–99)
Glucose-Capillary: 129 mg/dL — ABNORMAL HIGH (ref 70–99)
Glucose-Capillary: 131 mg/dL — ABNORMAL HIGH (ref 70–99)

## 2023-08-15 LAB — CBC
HCT: 25.1 % — ABNORMAL LOW (ref 39.0–52.0)
Hemoglobin: 8.2 g/dL — ABNORMAL LOW (ref 13.0–17.0)
MCH: 31.7 pg (ref 26.0–34.0)
MCHC: 32.7 g/dL (ref 30.0–36.0)
MCV: 96.9 fL (ref 80.0–100.0)
Platelets: 586 10*3/uL — ABNORMAL HIGH (ref 150–400)
RBC: 2.59 MIL/uL — ABNORMAL LOW (ref 4.22–5.81)
RDW: 14 % (ref 11.5–15.5)
WBC: 11.6 10*3/uL — ABNORMAL HIGH (ref 4.0–10.5)
nRBC: 0 % (ref 0.0–0.2)

## 2023-08-15 LAB — BASIC METABOLIC PANEL WITH GFR
Anion gap: 6 (ref 5–15)
BUN: 42 mg/dL — ABNORMAL HIGH (ref 8–23)
CO2: 23 mmol/L (ref 22–32)
Calcium: 7.9 mg/dL — ABNORMAL LOW (ref 8.9–10.3)
Chloride: 106 mmol/L (ref 98–111)
Creatinine, Ser: 1.6 mg/dL — ABNORMAL HIGH (ref 0.61–1.24)
GFR, Estimated: 47 mL/min — ABNORMAL LOW (ref >60)
Glucose, Bld: 238 mg/dL — ABNORMAL HIGH (ref 70–99)
Potassium: 4 mmol/L (ref 3.5–5.1)
Sodium: 135 mmol/L (ref 135–145)

## 2023-08-15 MED ORDER — INSULIN GLARGINE-YFGN 100 UNIT/ML ~~LOC~~ SOLN
25.0000 [IU] | Freq: Every day | SUBCUTANEOUS | Status: DC
  Administered 2023-08-15 – 2023-08-18 (×4): 25 [IU] via SUBCUTANEOUS
  Filled 2023-08-15 (×5): qty 0.25

## 2023-08-15 NOTE — TOC Progression Note (Signed)
 Transition of Care [facility]) - Progression Note    Patient Details  Name: Seth Burch MRN: [id number] Date of Birth: 1955/08/11  Transition of Care [facility]) CM/SW Contact  Lockie Pares, RN Phone Number: 08/15/2023, 10:14 AM  Clinical Narrative:    Messaged with Mitch from [employer]. He is aware that a calorie count is being done and the patient will likely DC Monday. He will need What TTF he will be on early as possible Monday   Expected Discharge Plan: Home w Home Health Services Barriers to Discharge: Continued Medical Work up  Expected Discharge Plan and Services   Discharge Planning Services: CM Consult Post Acute Care Choice: IP Rehab Living arrangements for the past 2 months: Single Family Home                 DME Arranged: Walker rolling, Bedside commode DME Agency: [employer] Date DME Agency Contacted: 08/11/23 Time DME Agency Contacted: 1549 Representative spoke with at DME Agency: April HH Arranged: PT, OT, RN, Speech Therapy HH Agency: [facility] Date [facility] Agency Contacted: 08/11/23 Time HH Agency Contacted: 1536 Representative spoke with at [facility] Agency: Amy   Social Determinants of Health (SDOH) Interventions SDOH Screenings   Food Insecurity: No Food Insecurity (07/29/2023)  Housing: Low Risk  (07/29/2023)  Transportation Needs: No Transportation Needs (07/29/2023)  Utilities: Not At Risk (07/29/2023)  Depression (PHQ2-9): Low Risk  (06/12/2023)  Social Connections: Moderately Integrated (07/29/2023)  Tobacco Use: Low Risk  (07/28/2023)    Readmission Risk Interventions     No data to display

## 2023-08-15 NOTE — Progress Notes (Addendum)
      [address]       [place] [address]             [phone]      18 Days Post-Op Procedure(s) (LRB): XI ROBOTIC ASSISTED IVOR LEWIS ESOPHAGECTOMY (N/A) EGD (ESOPHAGOGASTRODUODENOSCOPY) (N/A)  Subjective:  Patient sitting up in chair.  Continues to do well.  Able to tolerate this new diet better states more flavorable and smells a lot better.  Continues to have loose stools at times.  + ambulation  Objective: Vital signs in last 24 hours: Temp:  [98.7 F (37.1 C)-99.3 F (37.4 C)] 98.7 F (37.1 C) (03/28 0427) Pulse Rate:  [90-101] 92 (03/28 0427) Cardiac Rhythm: Normal sinus rhythm (03/27 1901) Resp:  [18-20] 19 (03/28 0427) BP: (124-134)/(69-84) 128/75 (03/28 0427) SpO2:  [95 %-97 %] 95 % (03/28 0427) Weight:  [78.6 kg] 78.6 kg (03/28 0427)  Intake/Output from previous day: 03/27 0701 - 03/28 0700 In: 3200.6 [P.O.:120; I.V.:1030.6; NG/GT:1820; IV Piggyback:200] Out: 210 [Urine:175; Drains:35]  General appearance: alert, cooperative, and no distress Heart: regular rate and rhythm Lungs: clear to auscultation bilaterally Abdomen: soft, non-tender; bowel sounds normal; no masses,  no organomegaly Extremities: extremities normal, atraumatic, no cyanosis or edema Wound: clean and dry, JP drain in place.. + purulent drainage  Lab Results: Recent Labs    08/14/23 0245 08/15/23 0329  WBC 13.0* 11.6*  HGB 8.0* 8.2*  HCT 24.6* 25.1*  PLT 528* 586*   BMET:  Recent Labs    08/14/23 0245 08/15/23 0329  NA 136 135  K 3.8 4.0  CL 107 106  CO2 21* 23  GLUCOSE 174* 238*  BUN 48* 42*  CREATININE 1.76* 1.60*  CALCIUM 8.0* 7.9*    PT/INR: No results for input(s): "LABPROT", "INR" in the last 72 hours. ABG    Component Value Date/Time   PHART 7.4 07/29/2023 0514   HCO3 25.8 08/02/2023 2006   TCO2 24 08/02/2023 1506   ACIDBASEDEF 1.0 08/02/2023 1506   O2SAT 94.5 08/02/2023 2006   CBG (last 3)  Recent Labs    08/14/23 1619  08/14/23 2104 08/15/23 0616  GLUCAP 210* 171* 232*    Assessment/Plan: S/P Procedure(s) (LRB): XI ROBOTIC ASSISTED IVOR LEWIS ESOPHAGECTOMY (N/A) EGD (ESOPHAGOGASTRODUODENOSCOPY) (N/A)  CV- Sinus Tach, rates improved- continue Lopressor, Norvasc Pulm- JP drain in place, output remains low, but is purulent in appearance leave in place Renal- creatinine continues to improve, patients oral intake of liquids much improved..  monitor ID- afebrile, leukocytosis much improved.. ABX discontinued by Dr. Cliffton Asters on 3/27 GI- Dysphagia 3 diet, continue tube feeds at current rate.. patient is able to pay copay to continue current feeds... initiate calorie count today DM- sugars are consistently elevated.. will increase insulin to 25 units at bedtime, continue SSIP.[name] as creatinine level improves will resume home Metformin Dispo- patient doing well, making good progress.. continue oral intake as able.... will institute calorie count to help see how much tube feedings he will need at home.... will monitor this weekend and patient continues to do well and remains clinically stable will plan to d/c home Monday.. H/H has been arranged   LOS: 18 days    [name], PA-C 08/15/2023  Agree with above Doing well Dispo planning  Armani Gawlik O Chaya Dehaan

## 2023-08-15 NOTE — Discharge Instructions (Addendum)
 Discharge Instructions:  1. You may tub bath daily with soap and water and keep incisions, feeding tube dry.  If you wish to cover wounds with dressing you may do so but please keep clean and change daily.  No tub baths or swimming until incisions have completely healed.  If your incisions become red or develop any drainage please call our office at [phone]  2. No Driving until cleared by Dr. Lucilla Lame office and you are no longer using narcotic pain medications  3. Diet- continue dysphagia 3 diet...do not advance diet until instructed by Dr. Cliffton Asters...continue Vivonex tube feedings at 70 ml/hr continuous.. these will be adjusted as oral intake improves   4. Flush Feeding tube every 8 hours with water (bottled or sterile is preferred)  4. Fever of 101.5 for at least 24 hours with no source, please contact our office at [phone]  5. Activity- up as tolerated, please walk at least 3 times per day.  Avoid strenuous activity  6. If any questions or concerns arise, please do not hesitate to contact our office at [phone]

## 2023-08-15 NOTE — Plan of Care (Signed)

## 2023-08-15 NOTE — Progress Notes (Signed)
 Physical Therapy Treatment Patient Details Name: Seth Burch MRN: [id number] DOB: [birth year] Today's Date: 08/15/2023   History of Present Illness 68 y.o. male presents to [facility] 07/28/23 for esophagectomy and EGD to address distal esophageal adenocarcinoma. Pt with trace R ptx, + atelectasis. 3/13 worsening renal function with transfer to ICU. 3/15-3/17 intubation. PMHx: HTN, DM    PT Comments  Pt resting in bed and agreeable to session. Pt demonstrating continued progress towards acute goals with session focused on dynamic balance challenges to improve balance/postural reactions and safe stair negotiation for increased safety with mobility. Pt demonstrating safe ascent/descent of full flight of stairs with light cues to slow for safety. Pt accepting dynamic balance challenges without LOB, pt with mild postural sway with lateral stepping and braiding. Educated pt on importance of contoneud mobility and appropriate activity progression with pt verbalizing understanding. Pt continues to benefit from skilled PT services to progress toward functional mobility goals.      If plan is discharge home, recommend the following: Assistance with cooking/housework;Assist for transportation;Help with stairs or ramp for entrance;A little help with walking and/or transfers;A little help with bathing/dressing/bathroom   Can travel by private vehicle        Equipment Recommendations  Rolling walker (2 wheels)    Recommendations for Other Services       Precautions / Restrictions Precautions Precautions: Fall;Other (comment) Precaution/Restrictions Comments: L UQ jejunostomy, JP bulb Restrictions Weight Bearing Restrictions Per Provider Order: No     Mobility  Bed Mobility Overal bed mobility: Modified Independent Bed Mobility: Rolling, Sidelying to Sit, Sit to Supine Rolling: Supervision Sidelying to sit: Supervision, Used rails, HOB elevated   Sit to supine: Supervision, HOB elevated   General  bed mobility comments: supervision for safety, good technique and no assist needed    Transfers Overall transfer level: Needs assistance Equipment used: None Transfers: Sit to/from Stand, Bed to chair/wheelchair/BSC Sit to Stand: Supervision   Step pivot transfers: Supervision       General transfer comment: supervision for safety    Ambulation/Gait Ambulation/Gait assistance: Contact guard assist, Supervision Gait Distance (Feet): 500 Feet Assistive device: None Gait Pattern/deviations: Step-through pattern, Decreased stride length, Drifts right/left Gait velocity: decr     General Gait Details: no overt LOB, self steadying with light touch to wall as needed   Stairs Stairs: Yes Stairs assistance: Contact guard assist Stair Management: One rail Left, Alternating pattern, Forwards Number of Stairs: 12 (flight) General stair comments: steady ascent/descent, catching heels x4 on descent, cues to slow with good carryover   Wheelchair Mobility     Tilt Bed    Modified Rankin (Stroke Patients Only)       Balance Overall balance assessment: Needs assistance, Mild deficits observed, not formally tested Sitting-balance support: No upper extremity supported, Feet supported Sitting balance-Leahy Scale: Good     Standing balance support: No upper extremity supported Standing balance-Leahy Scale: Fair Standing balance comment: no UE support during dynamic tasks             High level balance activites: Side stepping, Braiding, Backward walking, Head turns, Turns High Level Balance Comments: mild postural sway with challenges, no overt LOB            Communication Communication Communication: No apparent difficulties  Cognition Arousal: Alert Behavior During Therapy: WFL for tasks assessed/performed   PT - Cognitive impairments: No apparent impairments  Following commands: Intact      Cueing Cueing Techniques: Verbal  cues  Exercises      General Comments General comments (skin integrity, edema, etc.): VSS on RA      Pertinent Vitals/Pain Pain Assessment Pain Assessment: Faces Faces Pain Scale: Hurts a little bit Pain Location: abdominal dressing Pain Descriptors / Indicators: Other (Comment) (pulling) Pain Intervention(s): Other (comment), Monitored during session, Limited activity within patient's tolerance (inspected dressing)    Home Living                          Prior Function            PT Goals (current goals can now be found in the care plan section) Acute Rehab PT Goals Patient Stated Goal: to get better PT Goal Formulation: With patient/family Time For Goal Achievement: 08/13/23 Progress towards PT goals: Progressing toward goals    Frequency    Min 3X/week      PT Plan      Co-evaluation              AM-PAC PT "6 Clicks" Mobility   Outcome Measure  Help needed turning from your back to your side while in a flat bed without using bedrails?: A Little Help needed moving from lying on your back to sitting on the side of a flat bed without using bedrails?: A Little Help needed moving to and from a bed to a chair (including a wheelchair)?: A Little Help needed standing up from a chair using your arms (e.g., wheelchair or bedside chair)?: A Little Help needed to walk in hospital room?: A Little Help needed climbing 3-5 steps with a railing? : A Little 6 Click Score: 18    End of Session   Activity Tolerance: Patient tolerated treatment well Patient left: with call bell/phone within reach;in bed Nurse Communication: Mobility status PT Visit Diagnosis: Unsteadiness on feet (R26.81);Muscle weakness (generalized) (M62.81);Other abnormalities of gait and mobility (R26.89)     Time: 4098-1191 PT Time Calculation (min) (ACUTE ONLY): 19 min  Charges:    $Gait Training: 8-22 mins PT General Charges $$ ACUTE PT VISIT: 1 Visit                      Jean Skow R. PTA Acute Rehabilitation Services Office: [phone]   Catalina Antigua 08/15/2023, 3:33 PM

## 2023-08-15 NOTE — Progress Notes (Signed)
 Occupational Therapy Treatment Patient Details Name: Seth Burch MRN: [id number] DOB: February 17, 1956 Today's Date: 08/15/2023   History of present illness 68 y.o. 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 Pt with trace R ptx, + atelectasis. 3/13 worsening renal function with transfer to ICU. 3/15-3/17 intubation. PMHx: HTN, DM   OT comments  OT session focused on training in techniques for increased safety and independence with ADLs and bed mobility and functional mobility/transfers during ADLs. Pt currently demonstrates ability to complete ADLs with Mod I to Contact guard assist, bed mobility with Supervision, and functional transfers/mobility without an AD with Supervision to Contact guard assist for safety. Pt VSS on RA throughout session. Pt participated well in session and has met or is making good progress toward OT goals. OT goals updated this day based on pt progress and current functional level. Pt will benefit from continued acute skilled OT services to address deficits outlined below and increase safety and independence with functional tasks. Post acute discharge, pt will benefit from [facility] OT to maximize rehab potential.       If plan is discharge home, recommend the following:  A little help with walking and/or transfers;A little help with bathing/dressing/bathroom;Assistance with cooking/housework;Assist for transportation;Help with stairs or ramp for entrance   Equipment Recommendations  BSC/3in1    Recommendations for Other Services      Precautions / Restrictions Precautions Precautions: Fall;Other (comment) Precaution/Restrictions Comments: L UQ jejunostomy, JP bulb Restrictions Weight Bearing Restrictions Per Provider Order: No       Mobility Bed Mobility Overal bed mobility: Modified Independent Bed Mobility: Sidelying to Sit, Sit to Supine Rolling: Supervision Sidelying to sit: Supervision, Used rails, HOB  elevated   Sit to supine: Supervision, HOB elevated   General bed mobility comments: supervision for safety, good technique and no assist needed    Transfers Overall transfer level: Needs assistance Equipment used: None Transfers: Sit to/from Stand, Bed to chair/wheelchair/BSC Sit to Stand: Supervision     Step pivot transfers: Supervision, Contact guard assist     General transfer comment: Supervision to CGA for safety; no overt LOB noted     Balance Overall balance assessment: Needs assistance, Mild deficits observed, not formally tested Sitting-balance support: No upper extremity supported, Feet supported Sitting balance-Leahy Scale: Good     Standing balance support: No upper extremity supported, During functional activity Standing balance-Leahy Scale: Fair Standing balance comment: no UE support during dynamic tasks; no overt LOB noted                           ADL either performed or assessed with clinical judgement   ADL Overall ADL's : Needs assistance/impaired     Grooming: Modified independent;Supervision/safety;Contact guard assist;Standing Grooming Details (indicate cue type and reason): Mod I to perform tasks with Supervision to CGA in standing for safety Upper Body Bathing: Set up;Sitting   Lower Body Bathing: Supervison/ safety;Sitting/lateral leans;Contact guard assist;Sit to/from stand   Upper Body Dressing : Set up;Sitting   Lower Body Dressing: Set up;Sitting/lateral leans;Supervision/safety;Sit to/from stand   Toilet Transfer: Supervision/safety;Contact guard assist;Ambulation;Regular Toilet;Grab bars (without an AD) Toilet Transfer Details (indicate cue type and reason): Close Supervision to CGA for safety Toileting- Clothing Manipulation and Hygiene: Supervision/safety;Sit to/from stand;Sitting/lateral lean       Functional mobility during ADLs: Supervision/safety;Contact guard assist (without an AD; Supervision to CGA for safety; no  overt LOB noted)  Extremity/Trunk Assessment Upper Extremity Assessment Upper Extremity Assessment: Right hand dominant;Overall WFL for tasks assessed LUE Deficits / Details: missing distal phalanx of 5th digit, uncertain if congenital or due to prior injury- skin intact and does not affect function   Lower Extremity Assessment Lower Extremity Assessment: Defer to PT evaluation        Vision   Vision Assessment?: No apparent visual deficits   Perception     Praxis     Communication Communication Communication: No apparent difficulties   Cognition Arousal: Alert Behavior During Therapy: WFL for tasks assessed/performed Cognition: No apparent impairments             OT - Cognition Comments: Pt AAOx4 and pleasant throughout session.                 Following commands: Intact        Cueing   Cueing Techniques: Verbal cues  Exercises      Shoulder Instructions       General Comments VSS on RA throughout session    Pertinent Vitals/ Pain       Pain Assessment Pain Assessment: Faces Faces Pain Scale: Hurts a little bit Pain Location: abdominal dressing Pain Descriptors / Indicators: Other (Comment), Discomfort (pulling) Pain Intervention(s): Limited activity within patient's tolerance, Monitored during session, Repositioned  Home Living                                          Prior Functioning/Environment              Frequency  Min 2X/week        Progress Toward Goals  OT Goals(current goals can now be found in the care plan section)  Progress towards OT goals: Progressing toward goals;Goals updated;Goals met and updated - see care plan  Acute Rehab OT Goals Patient Stated Goal: to return home OT Goal Formulation: With patient Time For Goal Achievement: 08/22/23 Potential to Achieve Goals: Good ADL Goals Pt Will Perform Grooming: with modified independence;standing Pt Will Perform Lower Body Bathing: with  modified independence;sitting/lateral leans;sit to/from stand Pt Will Perform Upper Body Dressing: with modified independence;sitting Pt Will Perform Lower Body Dressing: with modified independence;sit to/from stand;sitting/lateral leans Pt Will Transfer to Toilet: with modified independence;regular height toilet;ambulating Pt Will Perform Toileting - Clothing Manipulation and hygiene: with modified independence;sit to/from stand;sitting/lateral leans  Plan      Co-evaluation                 AM-PAC OT "6 Clicks" Daily Activity     Outcome Measure   Help from another person eating meals?: A Little Help from another person taking care of personal grooming?: A Little Help from another person toileting, which includes using toliet, bedpan, or urinal?: A Little Help from another person bathing (including washing, rinsing, drying)?: A Little Help from another person to put on and taking off regular upper body clothing?: A Little Help from another person to put on and taking off regular lower body clothing?: A Little 6 Click Score: 18    End of Session    OT Visit Diagnosis: Unsteadiness on feet (R26.81);Other abnormalities of gait and mobility (R26.89);Other (comment) (decreased activity tolerance)   Activity Tolerance Patient tolerated treatment well   Patient Left in bed;with call bell/phone within reach   Nurse Communication Mobility status        Time: 1610-9604 OT  Time Calculation (min): 18 min  Charges: OT General Charges $OT Visit: 1 Visit OT Treatments $Self Care/Home Management : 8-22 mins  Skarlett Sedlacek "Orson Eva., OTR/L, MA Acute Rehab [phone]   Lendon Colonel 08/15/2023, 5:02 PM

## 2023-08-16 LAB — GLUCOSE, CAPILLARY
Glucose-Capillary: 184 mg/dL — ABNORMAL HIGH (ref 70–99)
Glucose-Capillary: 248 mg/dL — ABNORMAL HIGH (ref 70–99)
Glucose-Capillary: 158 mg/dL — ABNORMAL HIGH (ref 70–99)
Glucose-Capillary: 175 mg/dL — ABNORMAL HIGH (ref 70–99)

## 2023-08-16 LAB — BASIC METABOLIC PANEL WITH GFR
Anion gap: 9 (ref 5–15)
BUN: 41 mg/dL — ABNORMAL HIGH (ref 8–23)
CO2: 20 mmol/L — ABNORMAL LOW (ref 22–32)
Calcium: 8.2 mg/dL — ABNORMAL LOW (ref 8.9–10.3)
Chloride: 106 mmol/L (ref 98–111)
Creatinine, Ser: 1.5 mg/dL — ABNORMAL HIGH (ref 0.61–1.24)
GFR, Estimated: 51 mL/min — ABNORMAL LOW (ref >60)
Glucose, Bld: 210 mg/dL — ABNORMAL HIGH (ref 70–99)
Potassium: 3.8 mmol/L (ref 3.5–5.1)
Sodium: 135 mmol/L (ref 135–145)

## 2023-08-16 LAB — CBC
HCT: 24.5 % — ABNORMAL LOW (ref 39.0–52.0)
Hemoglobin: 8.1 g/dL — ABNORMAL LOW (ref 13.0–17.0)
MCH: 31.6 pg (ref 26.0–34.0)
MCHC: 33.1 g/dL (ref 30.0–36.0)
MCV: 95.7 fL (ref 80.0–100.0)
Platelets: 576 10*3/uL — ABNORMAL HIGH (ref 150–400)
RBC: 2.56 MIL/uL — ABNORMAL LOW (ref 4.22–5.81)
RDW: 14 % (ref 11.5–15.5)
WBC: 11.5 10*3/uL — ABNORMAL HIGH (ref 4.0–10.5)
nRBC: 0 % (ref 0.0–0.2)

## 2023-08-16 NOTE — Plan of Care (Signed)
   Problem: Education: Goal: Knowledge of General Education information will improve Description Including pain rating scale, medication(s)/side effects and non-pharmacologic comfort measures Outcome: Progressing   Problem: Health Behavior/Discharge Planning: Goal: Ability to manage health-related needs will improve Outcome: Progressing

## 2023-08-16 NOTE — Progress Notes (Signed)
      [address]       [place] [address]             [phone]      19 Days Post-Op Procedure(s) (LRB): XI ROBOTIC ASSISTED IVOR LEWIS ESOPHAGECTOMY (N/A) EGD (ESOPHAGOGASTRODUODENOSCOPY) (N/A)  Subjective:  Patient sitting up in chair.  Continues to feel better.  He is eating what he is able.  Stool is thickening up slowly.    Objective: Vital signs in last 24 hours: Temp:  [98.5 F (36.9 C)-99 F (37.2 C)] 98.8 F (37.1 C) (03/29 0739) Pulse Rate:  [84-98] 94 (03/29 0739) Cardiac Rhythm: Normal sinus rhythm (03/29 0700) Resp:  [19-20] 20 (03/29 0435) BP: (113-133)/(68-85) 124/68 (03/29 0739) SpO2:  [95 %-98 %] 98 % (03/29 0435) Weight:  [78.2 kg] 78.2 kg (03/29 0435)  Intake/Output from previous day: 03/28 0701 - 03/29 0700 In: 887.4 [P.O.:120; NG/GT:647.4] Out: 5 [Drains:5]  General appearance: alert, cooperative, and no distress Heart: regular rate and rhythm Lungs: clear to auscultation bilaterally Abdomen: soft, non-tender; bowel sounds normal; no masses,  no organomegaly Extremities: extremities normal, atraumatic, no cyanosis or edema Wound: clean and dry, J tube site with continued mild drainage  Lab Results: Recent Labs    08/15/23 0329 08/16/23 0504  WBC 11.6* 11.5*  HGB 8.2* 8.1*  HCT 25.1* 24.5*  PLT 586* 576*   BMET:  Recent Labs    08/15/23 0329 08/16/23 0504  NA 135 135  K 4.0 3.8  CL 106 106  CO2 23 20*  GLUCOSE 238* 210*  BUN 42* 41*  CREATININE 1.60* 1.50*  CALCIUM 7.9* 8.2*    PT/INR: No results for input(s): "LABPROT", "INR" in the last 72 hours. ABG    Component Value Date/Time   PHART 7.4 07/29/2023 0514   HCO3 25.8 08/02/2023 2006   TCO2 24 08/02/2023 1506   ACIDBASEDEF 1.0 08/02/2023 1506   O2SAT 94.5 08/02/2023 2006   CBG (last 3)  Recent Labs    08/15/23 1619 08/15/23 2116 08/16/23 0600  GLUCAP 129* 131* 184*    Assessment/Plan: S/P Procedure(s) (LRB): XI ROBOTIC ASSISTED IVOR LEWIS  ESOPHAGECTOMY (N/A) EGD (ESOPHAGOGASTRODUODENOSCOPY) (N/A)  CV- Sinus Tach stable, BP well controlled- continue Lopressor, Norvasc Pulm- no acute issues, JP drain remains in place with minimal output.. remains mucopurulent Renal-creatinine continues to improve, monitor GI- tolerating dysphagia 3 diet, regular liquid intake... on Vivonex tube feedings persistently.. calorie count over the weekend DM- sugars improved, less than 200 currently continue increase insulin.. can restart Metformin once creatinine is closer to normal Dispo- patient stable, doing well.. continue to increase oral intake as able, ambulating w/o issues.. H/H arrangements made... tentatively for discharge on Monday   LOS: 19 days    [name], PA-C 08/16/2023

## 2023-08-17 LAB — BASIC METABOLIC PANEL WITH GFR
Anion gap: 8 (ref 5–15)
BUN: 42 mg/dL — ABNORMAL HIGH (ref 8–23)
CO2: 20 mmol/L — ABNORMAL LOW (ref 22–32)
Calcium: 8.2 mg/dL — ABNORMAL LOW (ref 8.9–10.3)
Chloride: 105 mmol/L (ref 98–111)
Creatinine, Ser: 1.41 mg/dL — ABNORMAL HIGH (ref 0.61–1.24)
GFR, Estimated: 55 mL/min — ABNORMAL LOW (ref >60)
Glucose, Bld: 227 mg/dL — ABNORMAL HIGH (ref 70–99)
Potassium: 3.8 mmol/L (ref 3.5–5.1)
Sodium: 133 mmol/L — ABNORMAL LOW (ref 135–145)

## 2023-08-17 LAB — GLUCOSE, CAPILLARY
Glucose-Capillary: 200 mg/dL — ABNORMAL HIGH (ref 70–99)
Glucose-Capillary: 180 mg/dL — ABNORMAL HIGH (ref 70–99)
Glucose-Capillary: 268 mg/dL — ABNORMAL HIGH (ref 70–99)
Glucose-Capillary: 180 mg/dL — ABNORMAL HIGH (ref 70–99)

## 2023-08-17 LAB — CBC
HCT: 25 % — ABNORMAL LOW (ref 39.0–52.0)
Hemoglobin: 8.4 g/dL — ABNORMAL LOW (ref 13.0–17.0)
MCH: 32.1 pg (ref 26.0–34.0)
MCHC: 33.6 g/dL (ref 30.0–36.0)
MCV: 95.4 fL (ref 80.0–100.0)
Platelets: 578 10*3/uL — ABNORMAL HIGH (ref 150–400)
RBC: 2.62 MIL/uL — ABNORMAL LOW (ref 4.22–5.81)
RDW: 14 % (ref 11.5–15.5)
WBC: 12.3 10*3/uL — ABNORMAL HIGH (ref 4.0–10.5)
nRBC: 0 % (ref 0.0–0.2)

## 2023-08-17 NOTE — Plan of Care (Signed)
  Problem: Education: Goal: Knowledge of General Education information will improve Description: Including pain rating scale, medication(s)/side effects and non-pharmacologic comfort measures 08/17/2023 1912 by Duard Brady, RN Outcome: Progressing 08/17/2023 1912 by Duard Brady, RN Outcome: Progressing

## 2023-08-17 NOTE — Progress Notes (Signed)
 Mobility Specialist Progress Note:    08/17/23 1118  Therapy Vitals  Temp 98.9 F (37.2 C)  Temp Source Oral  Pulse Rate 90  BP 129/69  Patient Position (if appropriate) Lying  Oxygen Therapy  SpO2 95 %  O2 Device Room Air  Mobility  Activity Ambulated independently in hallway  Level of Assistance Standby assist, set-up cues, supervision of patient - no hands on  Assistive Device None  Distance Ambulated (ft) 400 ft  Activity Response Tolerated well  Mobility Referral Yes  Mobility visit 1 Mobility  Mobility Specialist Start Time (ACUTE ONLY) 0825  Mobility Specialist Stop Time (ACUTE ONLY) 0835  Mobility Specialist Time Calculation (min) (ACUTE ONLY) 10 min   Received pt in chair having no complaints and agreeable to mobility. Pt was asymptomatic throughout ambulation and returned to room w/o fault. Left in bed w/ call bell in reach and all needs met.   D'Vante Earlene Plater Mobility Specialist Please contact via Special educational needs teacher or Rehab office at [phone]

## 2023-08-17 NOTE — Progress Notes (Addendum)
      [address]       , [address]             [phone]      20 Days Post-Op Procedure(s) (LRB): XI ROBOTIC ASSISTED IVOR LEWIS ESOPHAGECTOMY (N/A) EGD (ESOPHAGOGASTRODUODENOSCOPY) (N/A)  Subjective:  Patient continues to do well.  Ambulated yesterday w/o issues.  + BM  Daughter and wife at bedside, anxious to get him home.  Objective: Vital signs in last 24 hours: Temp:  [98.7 F (37.1 C)-99.6 F (37.6 C)] 99.6 F (37.6 C) (03/30 0717) Pulse Rate:  [88-100] 92 (03/30 0717) Cardiac Rhythm: Normal sinus rhythm (03/29 1900) Resp:  [19-20] 19 (03/30 0416) BP: (125-135)/(69-73) 132/73 (03/30 0416) SpO2:  [93 %-97 %] 95 % (03/30 0717) Weight:  [77.7 kg] 77.7 kg (03/30 0416)  Intake/Output from previous day: 03/29 0701 - 03/30 0700 In: 220 [P.O.:120] Out: 0   General appearance: alert, cooperative, and no distress Heart: regular rate and rhythm Lungs: clear to auscultation bilaterally Abdomen: soft, non-tender; bowel sounds normal; no masses,  no organomegaly Extremities: extremities normal, atraumatic, no cyanosis or edema Wound: clean and ry  Lab Results: Recent Labs    08/16/23 0504 08/17/23 0500  WBC 11.5* 12.3*  HGB 8.1* 8.4*  HCT 24.5* 25.0*  PLT 576* 578*   BMET:  Recent Labs    08/16/23 0504 08/17/23 0500  NA 135 133*  K 3.8 3.8  CL 106 105  CO2 20* 20*  GLUCOSE 210* 227*  BUN 41* 42*  CREATININE 1.50* 1.41*  CALCIUM 8.2* 8.2*    PT/INR: No results for input(s): "LABPROT", "INR" in the last 72 hours. ABG    Component Value Date/Time   PHART 7.4 07/29/2023 0514   HCO3 25.8 08/02/2023 2006   TCO2 24 08/02/2023 1506   ACIDBASEDEF 1.0 08/02/2023 1506   O2SAT 94.5 08/02/2023 2006   CBG (last 3)  Recent Labs    08/16/23 1559 08/16/23 2107 08/17/23 0600  GLUCAP 158* 175* 200*    Assessment/Plan: S/P Procedure(s) (LRB): XI ROBOTIC ASSISTED IVOR LEWIS ESOPHAGECTOMY (N/A) EGD (ESOPHAGOGASTRODUODENOSCOPY)  (N/A)  CV- NSR, BP remains stable- continue Lopressor, Norvasc Pulm- no acute issues, not requiring oxygen.. JP drain in place with purulent drainage present, output remains low Renal- creatinine continues to trend down, continue liquid intake GI- remains on dysphagia 3 diet, will discuss possible advancement with Dr. Cliffton Asters.. continue tube feedings... calorie count in process ID- low grade temp of 99, only source I could think would be the purulent drainage from JP drain... ABX were discontinued by Dr. Cliffton Asters on 3/27... swallow was negative for leak DM-sugars occasionally elevated.. continue SSIP, Semeglee Dispo- patient doing well, increasing oral intake as able... possibly advance diet will discuss with Dr. Cliffton Asters, continue tube feedings.. plan is to discharge home tomorrow.. H/H arrangements made   LOS: 20 days   [name], PA-C 08/17/2023  Agree with above Doing well Home tomorrow  [name]

## 2023-08-17 NOTE — Plan of Care (Signed)
   Problem: Education: Goal: Knowledge of General Education information will improve Description Including pain rating scale, medication(s)/side effects and non-pharmacologic comfort measures Outcome: Progressing   Problem: Health Behavior/Discharge Planning: Goal: Ability to manage health-related needs will improve Outcome: Progressing

## 2023-08-18 ENCOUNTER — Other Ambulatory Visit (HOSPITAL_COMMUNITY): Payer: Self-pay

## 2023-08-18 LAB — CBC
HCT: 24 % — ABNORMAL LOW (ref 39.0–52.0)
Hemoglobin: 7.9 g/dL — ABNORMAL LOW (ref 13.0–17.0)
MCH: 31.2 pg (ref 26.0–34.0)
MCHC: 32.9 g/dL (ref 30.0–36.0)
MCV: 94.9 fL (ref 80.0–100.0)
Platelets: 513 10*3/uL — ABNORMAL HIGH (ref 150–400)
RBC: 2.53 MIL/uL — ABNORMAL LOW (ref 4.22–5.81)
RDW: 14 % (ref 11.5–15.5)
WBC: 11.3 10*3/uL — ABNORMAL HIGH (ref 4.0–10.5)
nRBC: 0 % (ref 0.0–0.2)

## 2023-08-18 LAB — BASIC METABOLIC PANEL WITH GFR
Anion gap: 9 (ref 5–15)
BUN: 46 mg/dL — ABNORMAL HIGH (ref 8–23)
CO2: 20 mmol/L — ABNORMAL LOW (ref 22–32)
Calcium: 8.1 mg/dL — ABNORMAL LOW (ref 8.9–10.3)
Chloride: 106 mmol/L (ref 98–111)
Creatinine, Ser: 1.5 mg/dL — ABNORMAL HIGH (ref 0.61–1.24)
GFR, Estimated: 51 mL/min — ABNORMAL LOW (ref >60)
Glucose, Bld: 209 mg/dL — ABNORMAL HIGH (ref 70–99)
Potassium: 3.7 mmol/L (ref 3.5–5.1)
Sodium: 135 mmol/L (ref 135–145)

## 2023-08-18 LAB — GLUCOSE, CAPILLARY
Glucose-Capillary: 180 mg/dL — ABNORMAL HIGH (ref 70–99)
Glucose-Capillary: 261 mg/dL — ABNORMAL HIGH (ref 70–99)
Glucose-Capillary: 225 mg/dL — ABNORMAL HIGH (ref 70–99)
Glucose-Capillary: 267 mg/dL — ABNORMAL HIGH (ref 70–99)

## 2023-08-18 MED ORDER — VIVONEX RTF PO LIQD
1000.0000 mL | ORAL | Status: DC
  Administered 2023-08-18: 1000 mL via JEJUNOSTOMY
  Filled 2023-08-18 (×2): qty 1000

## 2023-08-18 MED ORDER — PROSOURCE TF20 ENFIT COMPATIBL EN LIQD
60.0000 mL | Freq: Two times a day (BID) | ENTERAL | Status: DC
  Administered 2023-08-18: 60 mL
  Filled 2023-08-18 (×2): qty 60

## 2023-08-18 NOTE — TOC Progression Note (Addendum)
 Transition of Care [facility]) - Progression Note    Patient Details  Name: Seth Burch MRN: [id number] Date of Birth: 1956/01/16  [facility]) CM/SW Contact  Leone Haven, RN Phone Number: 08/18/2023, 12:11 PM  Clinical Narrative:    This NCM contacted Amy with [facility] to let her know that patient may be going home today.  She states she does not have this patient on her list, she checked and stated that this referral was not accepted yet and had been declined.  She states she will check with her director to see if they will be able to service this patient.  She states she will do all she can to get this patient serviced with [facility] for the tube feedings, then can trickled the other disciplines in as they go along. This NCM is awaiting call back from Amy with [facility].  Also this NCM asked Nutrition which tube feeding will patient be going home with, looks like that has not been determined yet, will need to let [employer] know which one to do order for doctor to sign. 1400 - per Amy with [facility] they can service this patient for all diciplines.  Patient will need three days worth of tube feedings to go home with until the tube feeds can be delivered.  NCM checked with main pharmacy to see if they have this in stock  and if they can provide for patient, they are checking into right now.  Per Nutrition patient will need two bags per day and per the Main pharmacy we do not have enough to give for 3 days.  Patient will need to remain here tile the tube feeds have been delivered to his home by supplier.   1531- the Main pharmacy said they just got some more in, so they do have enough to supply now.     Expected Discharge Plan: Home w Home Health Services Barriers to Discharge: Continued Medical Work up  Expected Discharge Plan and Services   Discharge Planning Services: CM Consult Post Acute Care Choice: IP Rehab Living arrangements for the past 2 months: Single Family Home                  DME Arranged: Walker rolling, Bedside commode DME Agency: [employer] Date DME Agency Contacted: 08/11/23 Time DME Agency Contacted: 1549 Representative spoke with at DME Agency: April HH Arranged: PT, OT, RN, Speech Therapy HH Agency: [facility] Date [facility] Agency Contacted: 08/11/23 Time HH Agency Contacted: 1536 Representative spoke with at [facility] Agency: Amy   Social Determinants of Health (SDOH) Interventions SDOH Screenings   Food Insecurity: No Food Insecurity (07/29/2023)  Housing: Low Risk  (07/29/2023)  Transportation Needs: No Transportation Needs (07/29/2023)  Utilities: Not At Risk (07/29/2023)  Depression (PHQ2-9): Low Risk  (06/12/2023)  Social Connections: Moderately Integrated (07/29/2023)  Tobacco Use: Low Risk  (07/28/2023)    Readmission Risk Interventions     No data to display

## 2023-08-18 NOTE — Progress Notes (Signed)
 Mobility Specialist Progress Note;   08/18/23 1025  Mobility  Activity Ambulated with assistance in hallway  Level of Assistance Standby assist, set-up cues, supervision of patient - no hands on  Assistive Device None  Distance Ambulated (ft) 400 ft  Activity Response Tolerated well  Mobility Referral Yes  Mobility visit 1 Mobility  Mobility Specialist Start Time (ACUTE ONLY) 1025  Mobility Specialist Stop Time (ACUTE ONLY) 1034  Mobility Specialist Time Calculation (min) (ACUTE ONLY) 9 min   Pt agreeable to mobility w/ encouragement. Required no physical assistance during ambulation, SV. Pt often reached out for hallway handrails for support while ambulating. VSS throughout and no c/o when asked. Pt returned back to bed with all needs met.   Caesar Bookman Mobility Specialist Please contact via [employer] or [employer] [phone]

## 2023-08-18 NOTE — Progress Notes (Signed)
      [address]       [place] [address]             [phone]      21 Days Post-Op Procedure(s) (LRB): XI ROBOTIC ASSISTED IVOR LEWIS ESOPHAGECTOMY (N/A) EGD (ESOPHAGOGASTRODUODENOSCOPY) (N/A)  Subjective:  Patient is sitting up in chair.  No new complaints.  He is ambulating w/o difficulty.  Oral intake as able.. not eating a whole lot.  Still having loose stools but it has improved and is not constant  Objective: Vital signs in last 24 hours: Temp:  [98.4 F (36.9 C)-99.6 F (37.6 C)] 99.4 F (37.4 C) (03/31 0722) Pulse Rate:  [87-92] 87 (03/31 0722) Cardiac Rhythm: Sinus tachycardia (03/30 1900) Resp:  [16-24] 20 (03/31 0722) BP: (120-134)/(66-75) 126/67 (03/31 0722) SpO2:  [93 %-99 %] 93 % (03/31 0722) Weight:  [77.3 kg] 77.3 kg (03/31 0623)  Intake/Output from previous day: 03/30 0701 - 03/31 0700 In: 580 [P.O.:580] Out: 200 [Urine:200]  General appearance: alert, cooperative, and no distress Heart: regular rate and rhythm Lungs: clear to auscultation bilaterally Abdomen: soft, non-tender; bowel sounds normal; no masses,  no organomegaly Extremities: edema trace Wound: clean and dry  Lab Results: Recent Labs    08/17/23 0500 08/18/23 0443  WBC 12.3* 11.3*  HGB 8.4* 7.9*  HCT 25.0* 24.0*  PLT 578* 513*   BMET:  Recent Labs    08/17/23 0500 08/18/23 0443  NA 133* 135  K 3.8 3.7  CL 105 106  CO2 20* 20*  GLUCOSE 227* 209*  BUN 42* 46*  CREATININE 1.41* 1.50*  CALCIUM 8.2* 8.1*    PT/INR: No results for input(s): "LABPROT", "INR" in the last 72 hours. ABG    Component Value Date/Time   PHART 7.4 07/29/2023 0514   HCO3 25.8 08/02/2023 2006   TCO2 24 08/02/2023 1506   ACIDBASEDEF 1.0 08/02/2023 1506   O2SAT 94.5 08/02/2023 2006   CBG (last 3)  Recent Labs    08/17/23 1608 08/17/23 2115 08/18/23 0622  GLUCAP 268* 180* 180*    Assessment/Plan: S/P Procedure(s) (LRB): XI ROBOTIC ASSISTED IVOR LEWIS ESOPHAGECTOMY  (N/A) EGD (ESOPHAGOGASTRODUODENOSCOPY) (N/A)  CV- NSR, BP remains stable-continue Norvasc, Lopressor Pulm- no acute issues, not requiring oxygen... JP drain remains low.. will remove today per Dr. Cliffton Asters Renal- creatinine has been stable to improving.. will need to have follow up with BMET GI-continue dysphagia 3 diet, tube feedings per nutrition recommendation... calorie count over the weekend ID- no leukocytosis, no evidence of infection.. patient provided education on what to watch out for s/s of infection at home and was instructed to contact our office immediately DM- sugars mostly controlled, occasionally elevated... continue current regimen.. these should improve once we can get patient off full time tube feedings Dispo- patient stable, doing well overall.. home health arrangements have been made... will await final tube feeding recommendations and plan to d/c home today   LOS: 21 days    [name], PA-C 08/18/2023

## 2023-08-18 NOTE — Progress Notes (Addendum)
 Nutrition Follow-up  DOCUMENTATION CODES:   Non-severe (moderate) malnutrition in context of chronic illness  INTERVENTION:   Route notes to cancer center RD for continuity of care  Continue to encourage pt to try protein powder (brought in by family) in any beverage. Pt has not tried yet, has been in pt room since 3/24 and reportedly taken home per patient interview today  Provided Nutrition Therapy post Esophagectomy handout from the [facility] [employer]    Increase tube feeding via J-tube: Vivonex RTF at 70 ml/h (1680 ml per day) Decrease Prosource TF20 60 ml to BID  Provides  1840 kcal, 124 gm protein, 1424 ml free water daily - this meets 84% of his estimated calorie needs and 108% of his estimated protein needs  NUTRITION DIAGNOSIS:  Moderate Malnutrition related to chronic illness (esophageal cancer s/p chemo, radiation and surgery) as evidenced by percent weight loss, mild fat depletion, moderate muscle depletion. 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  PMH includes DM, HTN, dyslipidemia, CKD, IgG kappa MGUS  3/10 EGD, XI Robotic Assisted Ivor Lewis Esophagectomy, J-tube placement 3/11 Trickle TF initiated 3/12 Surgery increased TF to 40 ml/hr 3/13 Surgery increased TF to 55 ml/hr, transferred to ICU  3/14 Surgery increased TF to 65 ml/hr (goal), bowel regimen added and +BM 3/15 Intubated, Bronch (Thick mucus in trachea suctioned, partially occluding L mainstem, ?aspiration, recurrent false vocal cord irritation with  swelling leading to stridor and resp failure) 3/17 Extubated 3/18 Esophogram:  Brisk tracheal aspiration of ingested contrast, into the LEFT mainstem bronchus. SLP eval recommended. Stagnant contrast at diaphragmatic hiatus. No anastomotic leak.  3/19 Diet advanced to Dysphagia 2/Thins, later downgraded to Nectar Thick Liquids 3/20 Transition to Nocturnal TF, family to bring protein shake in from home 3/21 Significant diarrhea overnight, (9 stools in 10 hours), significant J-tube drainage, change to Vivonex with day time feedings 3/24 Diet changed to Dysphagia 2, Thins per SLP 3/26 NPO due to concern for possible esophageal leak 3/27 advanced to Dysphagia 3, thin liquids  3/31 modify TF regimen for d/c  Calorie count completed across two days. Patient, on average, meeting approximately 16% of estimated calorie needs and 14% of estimated protein needs. See intake documentation below:  08/16/2023 Breakfast: 50% of oatmeal and bottled water (119kcal, 3g protein) Lunch: No lunch ordered, per health touch (0kcal, 0g protein) Dinner: 80% of chicken and dumplings and bottled water (140kcal, 7g protein) Supplements: None consumed  Total intake: 259 kcal (12% of minimum estimated needs)  10 protein (9% of minimum estimated needs)  08/17/2023 Breakfast: No breakfast ordered Lunch: 80% PB&J w/ bottled water (225kcal, 11g protein) Dinner: 75% PB&J w/ bottled water (225 kcal, 11g protein) Supplements: None consumed  Total intake: 450 kcal (20% of minimum estimated needs)  22 protein (19% of minimum estimated needs)  OVERALL AVERAGE INTAKE MEETING: 16% of estimated calorie needs and 14% of estimated protein needs  Pt intake did increase over the weekend, however not significantly. Meeting <=25% of needs. Met with patient at bedside today and discussed potential options for tube feed regimen at  discharge. Provided multiple formulas and schedules for TF administration and discussed pros and cons of  both. Pt preferring to stay on Vivonex RTF continuously to meet approximately 75% of his needs. It has been discussed with family previously that there will be a copay associated with this. Spoke with pt about weight loss trend since admission and the preference to avoid continuation of this trend. He voices understanding.   Bowels are stabilizing. He states he is having BMs 1-2x daily that are soft. No difficulties chewing or swallowing. Has yet to try protein powder discussed at previous visits. States his family took it home. Continues to report offense odor with all meals that inhibits intake. He continues to decline oral supplements stating they upset his stomach. Also stating he will not consume sandwich at bedside as it is on whole wheat bread and not white bread.   Admit Weight: 90kg Current Weight: 77.3kg 14% wt loss since admission three weeks ago. This is considered clinically significant for the time frame.  Today's weight is lowest weight to date. Discussed this trend with the patient and how current TF at rate of 65mL is not sufficiently meeting his needs as a sole source of intake. No edema on exam.   Meds: SSI 0-15 TID w/ meals, SSI 0-5 QHS, Semglee 25 QHS, pantoprazole  Labs:  Na+ 133>135 (wdl) K+ 3.7 (wdl) CBGs 209-227 x24 hours  Diet Order:   Diet Order             DIET DYS 3 Room service appropriate? Yes; Fluid consistency: Thin  Diet effective now            EDUCATION NEEDS:  Education needs have been addressed  Skin:  Skin Assessment: Skin Integrity Issues: Skin Integrity Issues:: Other (Comment) Incisions: new chest tube, new J-tube (07/28/2022) Other: Leaking around J-tube  Last BM:  3/26  Height:  Ht Readings from Last 1 Encounters:  08/07/23 5\' 10"  (1.778 m)   Weight:  Wt Readings from Last 1 Encounters:  08/18/23 77.3 kg   Ideal Body Weight:  75.5 kg  BMI:  Body mass index is 24.45 kg/m.  Estimated Nutritional Needs:   Kcal:  2200-2400  kcals  Protein:  115-130 g  Fluid:  >/= 2L  Myrtie Cruise MS, RD, LDN Registered Dietitian Clinical Nutrition RD Inpatient Contact Info in Amion

## 2023-08-19 ENCOUNTER — Other Ambulatory Visit: Payer: Self-pay

## 2023-08-19 ENCOUNTER — Encounter (HOSPITAL_COMMUNITY): Payer: Self-pay

## 2023-08-19 ENCOUNTER — Inpatient Hospital Stay (HOSPITAL_COMMUNITY)
Admission: EM | Admit: 2023-08-19 | Discharge: 2023-09-15 | Disposition: A | Discharge status: Home Health | Source: Home / Self Care | Attending: Thoracic Surgery (Cardiothoracic Vascular Surgery) | Admitting: Thoracic Surgery (Cardiothoracic Vascular Surgery)

## 2023-08-19 ENCOUNTER — Emergency Department (HOSPITAL_COMMUNITY)

## 2023-08-19 DIAGNOSIS — E1165 Type 2 diabetes mellitus with hyperglycemia: Secondary | ICD-10-CM | POA: Diagnosis not present

## 2023-08-19 DIAGNOSIS — R1311 Dysphagia, oral phase: Secondary | ICD-10-CM | POA: Diagnosis present

## 2023-08-19 DIAGNOSIS — J918 Pleural effusion in other conditions classified elsewhere: Secondary | ICD-10-CM | POA: Diagnosis not present

## 2023-08-19 DIAGNOSIS — J9601 Acute respiratory failure with hypoxia: Secondary | ICD-10-CM | POA: Diagnosis not present

## 2023-08-19 DIAGNOSIS — E872 Acidosis, unspecified: Secondary | ICD-10-CM | POA: Diagnosis present

## 2023-08-19 DIAGNOSIS — R748 Abnormal levels of other serum enzymes: Secondary | ICD-10-CM | POA: Diagnosis present

## 2023-08-19 DIAGNOSIS — D62 Acute posthemorrhagic anemia: Secondary | ICD-10-CM | POA: Diagnosis not present

## 2023-08-19 DIAGNOSIS — K9189 Other postprocedural complications and disorders of digestive system: Secondary | ICD-10-CM | POA: Diagnosis present

## 2023-08-19 DIAGNOSIS — G9341 Metabolic encephalopathy: Secondary | ICD-10-CM | POA: Diagnosis not present

## 2023-08-19 DIAGNOSIS — D472 Monoclonal gammopathy: Secondary | ICD-10-CM | POA: Diagnosis present

## 2023-08-19 DIAGNOSIS — N179 Acute kidney failure, unspecified: Secondary | ICD-10-CM | POA: Diagnosis not present

## 2023-08-19 DIAGNOSIS — Z794 Long term (current) use of insulin: Secondary | ICD-10-CM

## 2023-08-19 DIAGNOSIS — N400 Enlarged prostate without lower urinary tract symptoms: Secondary | ICD-10-CM | POA: Diagnosis present

## 2023-08-19 DIAGNOSIS — J90 Pleural effusion, not elsewhere classified: Secondary | ICD-10-CM | POA: Diagnosis not present

## 2023-08-19 DIAGNOSIS — E785 Hyperlipidemia, unspecified: Secondary | ICD-10-CM | POA: Diagnosis not present

## 2023-08-19 DIAGNOSIS — R7881 Bacteremia: Secondary | ICD-10-CM | POA: Diagnosis present

## 2023-08-19 DIAGNOSIS — K2289 Other specified disease of esophagus: Secondary | ICD-10-CM

## 2023-08-19 DIAGNOSIS — C159 Malignant neoplasm of esophagus, unspecified: Secondary | ICD-10-CM | POA: Diagnosis present

## 2023-08-19 DIAGNOSIS — E119 Type 2 diabetes mellitus without complications: Secondary | ICD-10-CM | POA: Diagnosis not present

## 2023-08-19 DIAGNOSIS — Z7982 Long term (current) use of aspirin: Secondary | ICD-10-CM

## 2023-08-19 DIAGNOSIS — Z9221 Personal history of antineoplastic chemotherapy: Secondary | ICD-10-CM

## 2023-08-19 DIAGNOSIS — E876 Hypokalemia: Secondary | ICD-10-CM | POA: Diagnosis not present

## 2023-08-19 DIAGNOSIS — E440 Moderate protein-calorie malnutrition: Secondary | ICD-10-CM | POA: Diagnosis present

## 2023-08-19 DIAGNOSIS — Z1152 Encounter for screening for COVID-19: Secondary | ICD-10-CM | POA: Diagnosis not present

## 2023-08-19 DIAGNOSIS — E1122 Type 2 diabetes mellitus with diabetic chronic kidney disease: Secondary | ICD-10-CM | POA: Diagnosis present

## 2023-08-19 DIAGNOSIS — B370 Candidal stomatitis: Secondary | ICD-10-CM | POA: Diagnosis not present

## 2023-08-19 DIAGNOSIS — J869 Pyothorax without fistula: Secondary | ICD-10-CM | POA: Diagnosis present

## 2023-08-19 DIAGNOSIS — Z923 Personal history of irradiation: Secondary | ICD-10-CM

## 2023-08-19 DIAGNOSIS — J9811 Atelectasis: Secondary | ICD-10-CM | POA: Diagnosis not present

## 2023-08-19 DIAGNOSIS — I129 Hypertensive chronic kidney disease with stage 1 through stage 4 chronic kidney disease, or unspecified chronic kidney disease: Secondary | ICD-10-CM | POA: Diagnosis present

## 2023-08-19 DIAGNOSIS — E870 Hyperosmolality and hypernatremia: Secondary | ICD-10-CM | POA: Diagnosis not present

## 2023-08-19 DIAGNOSIS — T85598A Other mechanical complication of other gastrointestinal prosthetic devices, implants and grafts, initial encounter: Secondary | ICD-10-CM | POA: Diagnosis not present

## 2023-08-19 DIAGNOSIS — J940 Chylous effusion: Principal | ICD-10-CM | POA: Diagnosis present

## 2023-08-19 DIAGNOSIS — D630 Anemia in neoplastic disease: Secondary | ICD-10-CM | POA: Diagnosis present

## 2023-08-19 DIAGNOSIS — R197 Diarrhea, unspecified: Secondary | ICD-10-CM | POA: Diagnosis not present

## 2023-08-19 DIAGNOSIS — R000 Tachycardia, unspecified: Secondary | ICD-10-CM | POA: Diagnosis not present

## 2023-08-19 DIAGNOSIS — J9382 Other air leak: Secondary | ICD-10-CM | POA: Diagnosis not present

## 2023-08-19 DIAGNOSIS — N1831 Chronic kidney disease, stage 3a: Secondary | ICD-10-CM | POA: Diagnosis present

## 2023-08-19 DIAGNOSIS — C155 Malignant neoplasm of lower third of esophagus: Secondary | ICD-10-CM | POA: Diagnosis present

## 2023-08-19 DIAGNOSIS — I898 Other specified noninfective disorders of lymphatic vessels and lymph nodes: Secondary | ICD-10-CM | POA: Diagnosis not present

## 2023-08-19 DIAGNOSIS — E46 Unspecified protein-calorie malnutrition: Secondary | ICD-10-CM | POA: Diagnosis not present

## 2023-08-19 DIAGNOSIS — D638 Anemia in other chronic diseases classified elsewhere: Secondary | ICD-10-CM

## 2023-08-19 DIAGNOSIS — R6510 Systemic inflammatory response syndrome (SIRS) of non-infectious origin without acute organ dysfunction: Secondary | ICD-10-CM | POA: Diagnosis not present

## 2023-08-19 DIAGNOSIS — K9413 Enterostomy malfunction: Secondary | ICD-10-CM | POA: Diagnosis not present

## 2023-08-19 DIAGNOSIS — N183 Chronic kidney disease, stage 3 unspecified: Secondary | ICD-10-CM | POA: Diagnosis not present

## 2023-08-19 DIAGNOSIS — Z79899 Other long term (current) drug therapy: Secondary | ICD-10-CM

## 2023-08-19 DIAGNOSIS — Y832 Surgical operation with anastomosis, bypass or graft as the cause of abnormal reaction of the patient, or of later complication, without mention of misadventure at the time of the procedure: Secondary | ICD-10-CM | POA: Diagnosis present

## 2023-08-19 DIAGNOSIS — R079 Chest pain, unspecified: Principal | ICD-10-CM | POA: Diagnosis present

## 2023-08-19 DIAGNOSIS — E43 Unspecified severe protein-calorie malnutrition: Secondary | ICD-10-CM | POA: Diagnosis present

## 2023-08-19 DIAGNOSIS — N401 Enlarged prostate with lower urinary tract symptoms: Secondary | ICD-10-CM | POA: Diagnosis not present

## 2023-08-19 DIAGNOSIS — Z6822 Body mass index (BMI) 22.0-22.9, adult: Secondary | ICD-10-CM

## 2023-08-19 DIAGNOSIS — Y831 Surgical operation with implant of artificial internal device as the cause of abnormal reaction of the patient, or of later complication, without mention of misadventure at the time of the procedure: Secondary | ICD-10-CM | POA: Diagnosis not present

## 2023-08-19 DIAGNOSIS — J690 Pneumonitis due to inhalation of food and vomit: Secondary | ICD-10-CM | POA: Diagnosis not present

## 2023-08-19 DIAGNOSIS — N182 Chronic kidney disease, stage 2 (mild): Secondary | ICD-10-CM | POA: Diagnosis not present

## 2023-08-19 DIAGNOSIS — D631 Anemia in chronic kidney disease: Secondary | ICD-10-CM | POA: Diagnosis present

## 2023-08-19 DIAGNOSIS — I10 Essential (primary) hypertension: Secondary | ICD-10-CM | POA: Diagnosis not present

## 2023-08-19 DIAGNOSIS — J948 Other specified pleural conditions: Secondary | ICD-10-CM | POA: Diagnosis not present

## 2023-08-19 LAB — COMPREHENSIVE METABOLIC PANEL WITH GFR
ALT: 32 U/L (ref 0–44)
AST: 25 U/L (ref 15–41)
Albumin: 2.3 g/dL — ABNORMAL LOW (ref 3.5–5.0)
Alkaline Phosphatase: 281 U/L — ABNORMAL HIGH (ref 38–126)
Anion gap: 14 (ref 5–15)
BUN: 48 mg/dL — ABNORMAL HIGH (ref 8–23)
CO2: 16 mmol/L — ABNORMAL LOW (ref 22–32)
Calcium: 8.6 mg/dL — ABNORMAL LOW (ref 8.9–10.3)
Chloride: 102 mmol/L (ref 98–111)
Creatinine, Ser: 1.89 mg/dL — ABNORMAL HIGH (ref 0.61–1.24)
GFR, Estimated: 39 mL/min — ABNORMAL LOW (ref >60)
Glucose, Bld: 201 mg/dL — ABNORMAL HIGH (ref 70–99)
Potassium: 4.3 mmol/L (ref 3.5–5.1)
Sodium: 132 mmol/L — ABNORMAL LOW (ref 135–145)
Total Bilirubin: 0.4 mg/dL (ref 0.0–1.2)
Total Protein: 7 g/dL (ref 6.5–8.1)

## 2023-08-19 LAB — I-STAT CG4 LACTIC ACID, ED
Lactic Acid, Venous: 2.8 mmol/L (ref 0.5–1.9)
Lactic Acid, Venous: 3 mmol/L (ref 0.5–1.9)
Lactic Acid, Venous: 3.4 mmol/L (ref 0.5–1.9)

## 2023-08-19 LAB — I-STAT CHEM 8, ED
BUN: 41 mg/dL — ABNORMAL HIGH (ref 8–23)
Calcium, Ion: 1.1 mmol/L — ABNORMAL LOW (ref 1.15–1.40)
Chloride: 104 mmol/L (ref 98–111)
Creatinine, Ser: 1.9 mg/dL — ABNORMAL HIGH (ref 0.61–1.24)
Glucose, Bld: 200 mg/dL — ABNORMAL HIGH (ref 70–99)
HCT: 29 % — ABNORMAL LOW (ref 39.0–52.0)
Hemoglobin: 9.9 g/dL — ABNORMAL LOW (ref 13.0–17.0)
Potassium: 4.2 mmol/L (ref 3.5–5.1)
Sodium: 132 mmol/L — ABNORMAL LOW (ref 135–145)
TCO2: 17 mmol/L — ABNORMAL LOW (ref 22–32)

## 2023-08-19 LAB — PROTIME-INR
INR: 1.2 (ref 0.8–1.2)
Prothrombin Time: 15.1 s (ref 11.4–15.2)

## 2023-08-19 LAB — URINALYSIS, W/ REFLEX TO CULTURE (INFECTION SUSPECTED)
Bilirubin Urine: NEGATIVE
Glucose, UA: NEGATIVE mg/dL
Hgb urine dipstick: NEGATIVE
Ketones, ur: NEGATIVE mg/dL
Leukocytes,Ua: NEGATIVE
Nitrite: NEGATIVE
Protein, ur: NEGATIVE mg/dL
Specific Gravity, Urine: 1.014 (ref 1.005–1.030)
pH: 5 (ref 5.0–8.0)

## 2023-08-19 LAB — RESP PANEL BY RT-PCR (RSV, FLU A&B, COVID)  RVPGX2
Influenza A by PCR: NEGATIVE
Influenza B by PCR: NEGATIVE
Resp Syncytial Virus by PCR: NEGATIVE
SARS Coronavirus 2 by RT PCR: NEGATIVE

## 2023-08-19 LAB — CBC WITH DIFFERENTIAL/PLATELET
Abs Immature Granulocytes: 0.21 10*3/uL — ABNORMAL HIGH (ref 0.00–0.07)
Basophils Absolute: 0.1 10*3/uL (ref 0.0–0.1)
Basophils Relative: 0 %
Eosinophils Absolute: 0 10*3/uL (ref 0.0–0.5)
Eosinophils Relative: 0 %
HCT: 29.4 % — ABNORMAL LOW (ref 39.0–52.0)
Hemoglobin: 9.4 g/dL — ABNORMAL LOW (ref 13.0–17.0)
Immature Granulocytes: 1 %
Lymphocytes Relative: 5 %
Lymphs Abs: 0.8 10*3/uL (ref 0.7–4.0)
MCH: 30.3 pg (ref 26.0–34.0)
MCHC: 32 g/dL (ref 30.0–36.0)
MCV: 94.8 fL (ref 80.0–100.0)
Monocytes Absolute: 1 10*3/uL (ref 0.1–1.0)
Monocytes Relative: 7 %
Neutro Abs: 13.6 10*3/uL — ABNORMAL HIGH (ref 1.7–7.7)
Neutrophils Relative %: 87 %
Platelets: 619 10*3/uL — ABNORMAL HIGH (ref 150–400)
RBC: 3.1 MIL/uL — ABNORMAL LOW (ref 4.22–5.81)
RDW: 14.5 % (ref 11.5–15.5)
WBC: 15.7 10*3/uL — ABNORMAL HIGH (ref 4.0–10.5)
nRBC: 0 % (ref 0.0–0.2)

## 2023-08-19 LAB — APTT: aPTT: 30 s (ref 24–36)

## 2023-08-19 LAB — TROPONIN I (HIGH SENSITIVITY)
Troponin I (High Sensitivity): 13 ng/L (ref <18)
Troponin I (High Sensitivity): 11 ng/L (ref <18)

## 2023-08-19 LAB — GLUCOSE, CAPILLARY: Glucose-Capillary: 226 mg/dL — ABNORMAL HIGH (ref 70–99)

## 2023-08-19 MED ORDER — LACTATED RINGERS IV SOLN: INTRAVENOUS | Status: AC

## 2023-08-19 MED ORDER — METRONIDAZOLE 500 MG/100ML IV SOLN
500.0000 mg | Freq: Once | INTRAVENOUS | Status: AC
  Administered 2023-08-20: 500 mg via INTRAVENOUS
  Filled 2023-08-19: qty 100

## 2023-08-19 MED ORDER — SODIUM CHLORIDE 0.9 % IV SOLN
2.0000 g | Freq: Two times a day (BID) | INTRAVENOUS | Status: DC
  Administered 2023-08-20 – 2023-08-21 (×3): 2 g via INTRAVENOUS
  Filled 2023-08-19 (×3): qty 12.5

## 2023-08-19 MED ORDER — PROSOURCE TF20 ENFIT COMPATIBL EN LIQD: 60.0000 mL | Freq: Two times a day (BID) | ENTERAL | Status: DC

## 2023-08-19 MED ORDER — OXYCODONE HCL 5 MG PO TABS: 5.0000 mg | ORAL_TABLET | ORAL | 0 refills | Status: DC | PRN

## 2023-08-19 MED ORDER — IOHEXOL 350 MG/ML SOLN
75.0000 mL | Freq: Once | INTRAVENOUS | Status: AC | PRN
  Administered 2023-08-19: 75 mL via INTRAVENOUS

## 2023-08-19 MED ORDER — SODIUM CHLORIDE 0.9 % IV SOLN
2.0000 g | Freq: Once | INTRAVENOUS | Status: AC
  Administered 2023-08-19: 2 g via INTRAVENOUS
  Filled 2023-08-19: qty 12.5

## 2023-08-19 MED ORDER — AMLODIPINE BESYLATE 10 MG PO TABS: 10.0000 mg | ORAL_TABLET | Freq: Every day | ORAL | 3 refills | Status: DC

## 2023-08-19 MED ORDER — LACTATED RINGERS IV BOLUS (SEPSIS)
1000.0000 mL | Freq: Once | INTRAVENOUS | Status: AC
  Administered 2023-08-19: 1000 mL via INTRAVENOUS

## 2023-08-19 MED ORDER — ACETAMINOPHEN 325 MG PO TABS: 325.0000 mg | ORAL_TABLET | ORAL | Status: DC | PRN

## 2023-08-19 MED ORDER — METOPROLOL TARTRATE 25 MG PO TABS: 12.5000 mg | ORAL_TABLET | Freq: Two times a day (BID) | ORAL | 3 refills | Status: DC

## 2023-08-19 MED ORDER — LACTATED RINGERS IV BOLUS (SEPSIS)
500.0000 mL | Freq: Once | INTRAVENOUS | Status: AC
  Administered 2023-08-20: 500 mL via INTRAVENOUS

## 2023-08-19 MED ORDER — HEPARIN SOD (PORK) LOCK FLUSH 100 UNIT/ML IV SOLN
500.0000 [IU] | INTRAVENOUS | Status: AC | PRN
  Administered 2023-08-19: 500 [IU]

## 2023-08-19 MED ORDER — VANCOMYCIN HCL 1500 MG/300ML IV SOLN
1500.0000 mg | Freq: Once | INTRAVENOUS | Status: AC
  Administered 2023-08-20: 1500 mg via INTRAVENOUS
  Filled 2023-08-19: qty 300

## 2023-08-19 MED ORDER — HYDROMORPHONE HCL 1 MG/ML IJ SOLN
1.0000 mg | INTRAMUSCULAR | Status: DC | PRN
  Administered 2023-08-20 – 2023-08-29 (×3): 1 mg via INTRAVENOUS
  Filled 2023-08-19 (×3): qty 1

## 2023-08-19 MED ORDER — GERHARDT'S BUTT CREAM: 1.0000 | TOPICAL_CREAM | Freq: Three times a day (TID) | CUTANEOUS | 1 refills | Status: DC

## 2023-08-19 MED ORDER — IOHEXOL 9 MG/ML PO SOLN
100.0000 mL | Freq: Once | ORAL | Status: AC
  Administered 2023-08-19: 100 mL via ORAL

## 2023-08-19 MED ORDER — VANCOMYCIN HCL IN DEXTROSE 1-5 GM/200ML-% IV SOLN: 1000.0000 mg | Freq: Once | INTRAVENOUS | Status: DC

## 2023-08-19 MED ORDER — HYDROMORPHONE HCL 1 MG/ML IJ SOLN
0.5000 mg | Freq: Once | INTRAMUSCULAR | Status: AC
  Administered 2023-08-19: 0.5 mg via INTRAVENOUS
  Filled 2023-08-19: qty 1

## 2023-08-19 MED ORDER — VIVONEX RTF PO LIQD: 1000.0000 mL | ORAL | Status: DC

## 2023-08-19 NOTE — Plan of Care (Signed)
  Problem: Education: Goal: Knowledge of General Education information will improve Description: Including pain rating scale, medication(s)/side effects and non-pharmacologic comfort measures Outcome: Progressing   Problem: Health Behavior/Discharge Planning: Goal: Ability to manage health-related needs will improve Outcome: Progressing   Problem: Clinical Measurements: Goal: Ability to maintain clinical measurements within normal limits will improve Outcome: Progressing Goal: Will remain free from infection Outcome: Progressing Goal: Diagnostic test results will improve Outcome: Progressing Goal: Respiratory complications will improve Outcome: Progressing Goal: Cardiovascular complication will be avoided Outcome: Progressing   Problem: Activity: Goal: Risk for activity intolerance will decrease Outcome: Progressing   Problem: Nutrition: Goal: Adequate nutrition will be maintained Outcome: Progressing   Problem: Coping: Goal: Level of anxiety will decrease Outcome: Progressing   Problem: Elimination: Goal: Will not experience complications related to bowel motility Outcome: Progressing Goal: Will not experience complications related to urinary retention Outcome: Progressing   Problem: Pain Managment: Goal: General experience of comfort will improve and/or be controlled Outcome: Progressing   Problem: Safety: Goal: Ability to remain free from injury will improve Outcome: Progressing   Problem: Skin Integrity: Goal: Risk for impaired skin integrity will decrease Outcome: Progressing   Problem: Education: Goal: Knowledge of the prescribed therapeutic regimen will improve Outcome: Progressing   Problem: Bowel/Gastric: Goal: Gastrointestinal status for postoperative course will improve Outcome: Progressing   Problem: Nutritional: Goal: Ability to achieve adequate nutritional intake will improve Outcome: Progressing   Problem: Clinical Measurements: Goal:  Postoperative complications will be avoided or minimized Outcome: Progressing   Problem: Respiratory: Goal: Ability to maintain a clear airway will improve Outcome: Progressing   Problem: Skin Integrity: Goal: Demonstration of wound healing without infection will improve Outcome: Progressing   Problem: Education: Goal: Ability to describe self-care measures that may prevent or decrease complications (Diabetes Survival Skills Education) will improve Outcome: Progressing Goal: Individualized Educational Video(s) Outcome: Progressing   Problem: Coping: Goal: Ability to adjust to condition or change in health will improve Outcome: Progressing   Problem: Fluid Volume: Goal: Ability to maintain a balanced intake and output will improve Outcome: Progressing   Problem: Health Behavior/Discharge Planning: Goal: Ability to identify and utilize available resources and services will improve Outcome: Progressing Goal: Ability to manage health-related needs will improve Outcome: Progressing   Problem: Metabolic: Goal: Ability to maintain appropriate glucose levels will improve Outcome: Progressing   Problem: Nutritional: Goal: Maintenance of adequate nutrition will improve Outcome: Progressing Goal: Progress toward achieving an optimal weight will improve Outcome: Progressing   Problem: Skin Integrity: Goal: Risk for impaired skin integrity will decrease Outcome: Progressing   Problem: Tissue Perfusion: Goal: Adequacy of tissue perfusion will improve Outcome: Progressing

## 2023-08-19 NOTE — ED Triage Notes (Signed)
 Pt reports sudden onset of sharp, right sided chest pain that started when he was eating dinner around 1800. He reports he was just d/c from here today after having a robotic assisted esophagectomy on 07/28/2023. He does appear pale.

## 2023-08-19 NOTE — Plan of Care (Signed)
  Problem: Education: Goal: Knowledge of General Education information will improve Description: Including pain rating scale, medication(s)/side effects and non-pharmacologic comfort measures Outcome: Progressing   Problem: Health Behavior/Discharge Planning: Goal: Ability to manage health-related needs will improve Outcome: Progressing   Problem: Clinical Measurements: Goal: Respiratory complications will improve Outcome: Progressing Goal: Cardiovascular complication will be avoided Outcome: Progressing   Problem: Pain Managment: Goal: General experience of comfort will improve and/or be controlled Outcome: Progressing

## 2023-08-19 NOTE — ED Provider Triage Note (Signed)
 Emergency Medicine Provider Triage Evaluation Note  Seth Burch , a 68 y.o. male  was evaluated in triage.  Pt complains of pain to his upper abdomen/lower chest on the right.  Reports it started about 1800 tonight.  Patient was recently discharged a few hours prior today after long course from esophagectomy from distal esophageal cancer.  Denies any fevers or any shortness of breath.  Reports this happened after eating.  Does not appear in any acute distress.  Is mildly tachycardic.    Review of Systems  Positive:  Negative:    Physical Exam  BP 109/70 (BP Location: Left Arm)   Pulse (!) 108   Temp 99.4 F (37.4 C)   Resp 16   Ht 5\' 10"  (1.778 m)   Wt 77.1 kg   SpO2 90%   BMI 24.39 kg/m  Gen:   Awake, no distress   Resp:  Normal effort  MSK:   Moves extremities without difficulty  Other:  Patient is placing 2 fingers just under his right pec.  Is nontender upon palpation.  No tenderness to the abdomen upon palpation.  Patient does not appear in any acute distress.  Mildly tachycardic.  Medical Decision Making  Medically screening exam initiated at 8:10 PM.  Appropriate orders placed.  VERLON PISCHKE was informed that the remainder of the evaluation will be completed by another provider, this initial triage assessment does not replace that evaluation, and the importance of remaining in the ED until their evaluation is complete.  And symptoms and location as well as recent hospital course, CT PE and CT abdominal scans ordered with labs.  Charge nurse aware the patient's next to room.   Achille Rich, PA-C 08/19/23 2012

## 2023-08-19 NOTE — ED Provider Notes (Signed)
 [facility] Emergency Department Provider Note MRN:  [id number]  Arrival date & time: 08/20/23     Chief Complaint   Chest Pain   History of Present Illness   Seth Burch is a 68 y.o. year-old male presents to the ED with chief complaint of right sided chest pain. Also has some pain in the upper abdomen.  Reports it started about 1800 tonight.  Patient was recently discharged a few hours prior today after long course from esophagectomy from distal esophageal cancer.  Denies any fevers or any shortness of breath.  Reports this happened after eating.  Does not appear in any acute distress.  Had purulence from his JP drain which was removed today.  History provided by patient.   Review of Systems  Pertinent positive and negative review of systems noted in HPI.    Physical Exam   Vitals:   08/19/23 2315 08/19/23 2354  BP: (!) 151/79   Pulse: (!) 101 (!) 103  Resp: 14 (!) 30  Temp:    SpO2: 100% 96%    CONSTITUTIONAL:  non toxic-appearing, NAD NEURO:  Alert and oriented x 3, CN 3-12 grossly intact EYES:  eyes equal and reactive ENT/NECK:  Supple, no stridor  CARDIO:  tachycardic, regular rhythm, appears well-perfused  PULM:  No respiratory distress, CTAB, bandage over recent JP drain site without active drainage GI/GU:  non-distended, no focal tenderness MSK/SPINE:  No gross deformities, no edema, moves all extremities  SKIN:  no rash, atraumatic   *Additional and/or pertinent findings included in MDM below  Diagnostic and Interventional Summary    EKG Interpretation Date/Time:    Ventricular Rate:    PR Interval:    QRS Duration:    QT Interval:    QTC Calculation:   R Axis:      Text Interpretation:         Labs Reviewed  COMPREHENSIVE METABOLIC PANEL WITH GFR - Abnormal; Notable for the following components:      Result Value   Sodium 132 (*)    CO2 16 (*)    Glucose, Bld 201 (*)    BUN 48 (*)    Creatinine, Ser 1.89 (*)     Calcium 8.6 (*)    Albumin 2.3 (*)    Alkaline Phosphatase 281 (*)    GFR, Estimated 39 (*)    All other components within normal limits  CBC WITH DIFFERENTIAL/PLATELET - Abnormal; Notable for the following components:   WBC 15.7 (*)    RBC 3.10 (*)    Hemoglobin 9.4 (*)    HCT 29.4 (*)    Platelets 619 (*)    Neutro Abs 13.6 (*)    Abs Immature Granulocytes 0.21 (*)    All other components within normal limits  URINALYSIS, W/ REFLEX TO CULTURE (INFECTION SUSPECTED) - Abnormal; Notable for the following components:   APPearance HAZY (*)    Bacteria, UA RARE (*)    All other components within normal limits  I-STAT CHEM 8, ED - Abnormal; Notable for the following components:   Sodium 132 (*)    BUN 41 (*)    Creatinine, Ser 1.90 (*)    Glucose, Bld 200 (*)    Calcium, Ion 1.10 (*)    TCO2 17 (*)    Hemoglobin 9.9 (*)    HCT 29.0 (*)    All other components within normal limits  I-STAT CG4 LACTIC ACID, ED - Abnormal; Notable for the following components:   Lactic  Acid, Venous 2.8 (*)    All other components within normal limits  I-STAT CG4 LACTIC ACID, ED - Abnormal; Notable for the following components:   Lactic Acid, Venous 3.0 (*)    All other components within normal limits  I-STAT CG4 LACTIC ACID, ED - Abnormal; Notable for the following components:   Lactic Acid, Venous 3.4 (*)    All other components within normal limits  RESP PANEL BY RT-PCR (RSV, FLU A&B, COVID)  RVPGX2  CULTURE, BLOOD (ROUTINE X 2)  CULTURE, BLOOD (ROUTINE X 2)  MRSA NEXT GEN BY PCR, NASAL  PROTIME-INR  APTT  TROPONIN I (HIGH SENSITIVITY)  TROPONIN I (HIGH SENSITIVITY)    CT Angio Chest PE W and/or Wo Contrast  Final Result    CT CHEST ABDOMEN PELVIS W CONTRAST  Final Result    DG Chest 2 View  Final Result      Medications  lactated ringers infusion (has no administration in time range)  metroNIDAZOLE (FLAGYL) IVPB 500 mg (500 mg Intravenous New Bag/Given 08/20/23 0004)  vancomycin  (VANCOREADY) IVPB 1500 mg/300 mL (has no administration in time range)  ceFEPIme (MAXIPIME) 2 g in sodium chloride 0.9 % 100 mL IVPB (has no administration in time range)  HYDROmorphone (DILAUDID) injection 1 mg (has no administration in time range)  vancomycin (VANCOCIN) IVPB 1000 mg/200 mL premix (has no administration in time range)  lactated ringers bolus 1,000 mL (0 mLs Intravenous Stopped 08/19/23 2328)    And  lactated ringers bolus 1,000 mL (1,000 mLs Intravenous New Bag/Given 08/19/23 2234)    And  lactated ringers bolus 500 mL (500 mLs Intravenous New Bag/Given 08/20/23 0004)  ceFEPIme (MAXIPIME) 2 g in sodium chloride 0.9 % 100 mL IVPB (0 g Intravenous Stopped 08/19/23 2256)  HYDROmorphone (DILAUDID) injection 0.5 mg (0.5 mg Intravenous Given 08/19/23 2352)  iohexol (OMNIPAQUE) 350 MG/ML injection 75 mL (75 mLs Intravenous Contrast Given 08/19/23 2354)  iohexol (OMNIPAQUE) 9 MG/ML oral solution 100 mL (100 mLs Oral Contrast Given 08/19/23 2356)     Procedures  /  Critical Care .Critical Care  Performed by: Roxy Horseman, PA-C Authorized by: Roxy Horseman, PA-C   Critical care provider statement:    Critical care time (minutes):  48   Critical care was necessary to treat or prevent imminent or life-threatening deterioration of the following conditions:  Sepsis   Critical care was time spent personally by me on the following activities:  Development of treatment plan with patient or surrogate, discussions with consultants, evaluation of patient's response to treatment, examination of patient, ordering and review of laboratory studies, ordering and review of radiographic studies, ordering and performing treatments and interventions, pulse oximetry, re-evaluation of patient's condition and review of old charts   ED Course and Medical Decision Making  I have reviewed the triage vital signs, the nursing notes, and pertinent available records from the EMR.  Social Determinants Affecting  Complexity of Care: Patient has no clinically significant social determinants affecting this chief complaint..   ED Course: Clinical Course as of 08/20/23 0056  Tue Aug 19, 2023  2223 Code sepsis activated upon me seeing the patient.  Lactics are uptrending.  Ordered 30 cc/kg fluid resuscitation and broad spectrum abx.  Scans still pending that were ordered in triage. [RB]    Clinical Course User Index [RB] Roxy Horseman, PA-C    Medical Decision Making Patient here with right-sided chest pain after eating tonight.  Had recent lewis esophagectomy.    Elevated lactic and  tachycardia with low-grade fevers.  Will start broad-spectrum antibiotics and activate code sepsis.  Patient getting weight-based fluids.  I called and discussed case with CT surgery, Dr. Dorris Fetch, who recommends esophagram as well.  I received phone call from radiology who notes that there is a leak.  Patient admitted to CT surgery.  Amount and/or Complexity of Data Reviewed Radiology: ordered.  Risk Prescription drug management. Decision regarding hospitalization.         Consultants: I consulted with Dr. Dorris Fetch from CT surgery, who is appreciated for admitting.   Treatment and Plan: Patient's exam and diagnostic results are concerning for SIRS and hydropneumothorax from suspected esophageal leak.  Feel that patient will need admission to the hospital for further treatment and evaluation.    Final Clinical Impressions(s) / ED Diagnoses     ICD-10-CM   1. SIRS (systemic inflammatory response syndrome) (HCC)  R65.10       ED Discharge Orders     None         Discharge Instructions Discussed with and Provided to Patient:   Discharge Instructions   None      Roxy Horseman, PA-C 08/20/23 0056    Wilkie Aye, Clabe Seal, DO 08/20/23 1529

## 2023-08-19 NOTE — Evaluation (Addendum)
 Occupational Therapy Treatment Patient Details Name: Seth Burch MRN: [id number] DOB: 01-13-1956 Today's Date: 08/19/2023   History of Present Illness   68 y.o. male presents to [facility] 07/28/23 for esophagectomy and EGD to address distal esophageal adenocarcinoma. Pt with trace R ptx, + atelectasis. 3/13 worsening renal function with transfer to ICU. 3/15-3/17 intubation. PMHx: HTN, DM     Clinical Impressions Pt eager to get home, and engaged in whole body dressing with assist from wife (who will be assisting him at home) Pt min A for LB dressing, mod I for UB dressing, performing transfers and in room mobility with mod I. Pt donned his own shoes - and is familiar with AE for LB ADL. Wife and daughter present and supportive. Patient and family had no questions at the end of the session, feel good about going home and HHOT remains appropriate at this time.      If plan is discharge home, recommend the following:   A little help with walking and/or transfers;A little help with bathing/dressing/bathroom;Assistance with cooking/housework;Assist for transportation;Help with stairs or ramp for entrance     Functional Status Assessment         Equipment Recommendations   BSC/3in1     Recommendations for Other Services         Precautions/Restrictions   Precautions Precautions: Fall;Other (comment) Precaution/Restrictions Comments: LUE jejunostomy Restrictions Weight Bearing Restrictions Per Provider Order: No     Mobility Bed Mobility Overal bed mobility: Modified Independent                  Transfers Overall transfer level: Modified independent Equipment used: None Transfers: Sit to/from Stand Sit to Stand: Modified independent (Device/Increase time)                  Balance Overall balance assessment: No apparent balance deficits (not formally assessed)                                         ADL either performed or assessed  with clinical judgement   ADL Overall ADL's : Needs assistance/impaired     Grooming: Modified independent;Supervision/safety;Contact guard assist;Standing           Upper Body Dressing : Sitting;Modified independent   Lower Body Dressing: Minimal assistance;With caregiver independent assisting;Sit to/from stand Lower Body Dressing Details (indicate cue type and reason): underwear, pants, and socks/shoes - wife assisting Toilet Transfer: Modified Independent   Toileting- Clothing Manipulation and Hygiene: Minimal assistance;With caregiver independent assisting;Sit to/from stand Toileting - Clothing Manipulation Details (indicate cue type and reason): donning underwear     Functional mobility during ADLs: Supervision/safety (no AD) General ADL Comments: ready to go home     Vision         Perception         Praxis         Pertinent Vitals/Pain Pain Assessment Pain Assessment: Faces Faces Pain Scale: Hurts a little bit Pain Location: abdominal dressing Pain Descriptors / Indicators: Discomfort, Grimacing Pain Intervention(s): Monitored during session, Repositioned     Extremity/Trunk Assessment             Communication Communication Communication: No apparent difficulties   Cognition Arousal: Alert Behavior During Therapy: WFL for tasks assessed/performed Cognition: No apparent impairments  Following commands: Intact       Cueing  General Comments   Cueing Techniques: Verbal cues      Exercises     Shoulder Instructions      Home Living                                          Prior Functioning/Environment                      OT Problem List:     OT Treatment/Interventions:        OT Goals(Current goals can be found in the care plan section)   Acute Rehab OT Goals Patient Stated Goal: get home ASAP OT Goal Formulation: With patient/family Time For Goal  Achievement: 08/22/23 Potential to Achieve Goals: Good   OT Frequency:  Min 2X/week    Co-evaluation              AM-PAC OT "6 Clicks" Daily Activity     Outcome Measure Help from another person eating meals?: A Little (NPO?) Help from another person taking care of personal grooming?: A Little Help from another person toileting, which includes using toliet, bedpan, or urinal?: A Little Help from another person bathing (including washing, rinsing, drying)?: A Little Help from another person to put on and taking off regular upper body clothing?: A Little Help from another person to put on and taking off regular lower body clothing?: A Little 6 Click Score: 18   End of Session Equipment Utilized During Treatment:  (none) Nurse Communication: Mobility status  Activity Tolerance: Patient tolerated treatment well Patient left: in bed;with family/visitor present;with call bell/phone within reach  OT Visit Diagnosis: Unsteadiness on feet (R26.81);Other abnormalities of gait and mobility (R26.89);Other (comment) (decreased activity tolerance)                Time: 1000-1019 OT Time Calculation (min): 19 min Charges:  OT General Charges $OT Visit: 1 Visit OT Treatments $Self Care/Home Management : 8-22 mins  Nyoka Cowden OTR/L Acute Rehabilitation Services Office: [phone]  Evern Bio [facility] 08/19/2023, 11:02 AM

## 2023-08-19 NOTE — H&P (Signed)
 Seth BASNETT is an 68 y.o. male.   Chief Complaint: chest pain HPI: 68 yo man released earlier today on POD # 22 after robotic assisted esophagectomy.  Postoperative course complicated by AKI, respiratory failure, delirium, and aspiration pneumonia.  Had multiple swallow studies showing no leak, but was noted to have some aspiration initially.  Diet advanced to D3.  Completed antibiotics on 3/27.    This evening after eating he had sudden onset of right sided CP.  Came to ED.  Borderline tachycardic, sat 975 on RA.  CXR shows increase opacity at right base.   WBC 15.7 K up from 12K on 3/30.  Lactic acid 2.8 on arrival, repeat was 3.4.    Currently still has right sided CP, no nausea or vomiting, increased pain with deep breath.  Past Medical History:  Diagnosis Date   Arthritis    Cancer (HCC)    Chronic kidney disease    Diabetes mellitus (HCC)    Dyslipidemia    Hypertension     Past Surgical History:  Procedure Laterality Date   ESOPHAGOGASTRODUODENOSCOPY N/A 07/28/2023   Procedure: EGD (ESOPHAGOGASTRODUODENOSCOPY);  Surgeon: [name], MD;  Location: [facility] OR;  Service: Thoracic;  Laterality: N/A;   ESOPHAGOGASTRODUODENOSCOPY (EGD) WITH PROPOFOL N/A 04/04/2023   Procedure: ESOPHAGOGASTRODUODENOSCOPY (EGD) WITH PROPOFOL;  Surgeon: Jeani Hawking, MD;  Location: [facility];  Service: Gastroenterology;  Laterality: N/A;   IR IMAGING GUIDED PORT INSERTION  04/09/2023   UPPER ESOPHAGEAL ENDOSCOPIC ULTRASOUND (EUS) N/A 04/04/2023   Procedure: UPPER ESOPHAGEAL ENDOSCOPIC ULTRASOUND (EUS);  Surgeon: Jeani Hawking, MD;  Location: [facility];  Service: Gastroenterology;  Laterality: N/A;    History reviewed. No pertinent family history. Social History:  reports that he has never smoked. He has never used smokeless tobacco. He reports current alcohol use of about 1.0 standard drink of alcohol per week. He reports that he does not use drugs.  Allergies: No Known  Allergies  (Not in a hospital admission)   Results for orders placed or performed during the hospital encounter of 08/19/23 (from the past 48 hours)  Troponin I (High Sensitivity)     Status: None   Collection Time: 08/19/23  8:08 PM  Result Value Ref Range   Troponin I (High Sensitivity) 13 <18 ng/L    Comment: (NOTE) Elevated high sensitivity troponin I (hsTnI) values and significant  changes across serial measurements may suggest ACS but many other  chronic and acute conditions are known to elevate hsTnI results.  Refer to the "Links" section for chest pain algorithms and additional  guidance. Performed at [facility], [address] [place], [place] [address]   Comprehensive metabolic panel     Status: Abnormal   Collection Time: 08/19/23  8:18 PM  Result Value Ref Range   Sodium 132 (L) 135 - 145 mmol/L   Potassium 4.3 3.5 - 5.1 mmol/L   Chloride 102 98 - 111 mmol/L   CO2 16 (L) 22 - 32 mmol/L   Glucose, Bld 201 (H) 70 - 99 mg/dL    Comment: Glucose reference range applies only to samples taken after fasting for at least 8 hours.   BUN 48 (H) 8 - 23 mg/dL   Creatinine, Ser 5.28 (H) 0.61 - 1.24 mg/dL   Calcium 8.6 (L) 8.9 - 10.3 mg/dL   Total Protein 7.0 6.5 - 8.1 g/dL   Albumin 2.3 (L) 3.5 - 5.0 g/dL   AST 25 15 - 41 U/L   ALT 32 0 -  44 U/L   Alkaline Phosphatase 281 (H) 38 - 126 U/L   Total Bilirubin 0.4 0.0 - 1.2 mg/dL   GFR, Estimated 39 (L) >60 mL/min    Comment: (NOTE) Calculated using the CKD-EPI Creatinine Equation (2021)    Anion gap 14 5 - 15    Comment: Performed at [facility], [address], [place], [place] [address]  CBC with Differential     Status: Abnormal   Collection Time: 08/19/23  8:18 PM  Result Value Ref Range   WBC 15.7 (H) 4.0 - 10.5 K/uL   RBC 3.10 (L) 4.22 - 5.81 MIL/uL   Hemoglobin 9.4 (L) 13.0 - 17.0 g/dL   HCT 54.0 (L) 98.1 - 19.1 %   MCV 94.8 80.0 - 100.0 fL   MCH 30.3 26.0 - 34.0 pg   MCHC 32.0 30.0 - 36.0 g/dL    RDW 47.8 29.5 - 62.1 %   Platelets 619 (H) 150 - 400 K/uL   nRBC 0.0 0.0 - 0.2 %   Neutrophils Relative % 87 %   Neutro Abs 13.6 (H) 1.7 - 7.7 K/uL   Lymphocytes Relative 5 %   Lymphs Abs 0.8 0.7 - 4.0 K/uL   Monocytes Relative 7 %   Monocytes Absolute 1.0 0.1 - 1.0 K/uL   Eosinophils Relative 0 %   Eosinophils Absolute 0.0 0.0 - 0.5 K/uL   Basophils Relative 0 %   Basophils Absolute 0.1 0.0 - 0.1 K/uL   Immature Granulocytes 1 %   Abs Immature Granulocytes 0.21 (H) 0.00 - 0.07 K/uL    Comment: Performed at [facility], [address], [place], [place] [address]  Protime-INR     Status: None   Collection Time: 08/19/23  8:18 PM  Result Value Ref Range   Prothrombin Time 15.1 11.4 - 15.2 seconds   INR 1.2 0.8 - 1.2    Comment: (NOTE) INR goal varies based on device and disease states. Performed at [facility], [address], [place], [place] [address]   APTT     Status: None   Collection Time: 08/19/23  8:18 PM  Result Value Ref Range   aPTT 30 24 - 36 seconds    Comment: Performed at [facility], [address] [place], [place] [address]  I-stat chem 8, ED (not at [facility], [facility] or [facility])     Status: Abnormal   Collection Time: 08/19/23  8:22 PM  Result Value Ref Range   Sodium 132 (L) 135 - 145 mmol/L   Potassium 4.2 3.5 - 5.1 mmol/L   Chloride 104 98 - 111 mmol/L   BUN 41 (H) 8 - 23 mg/dL   Creatinine, Ser 8.41 (H) 0.61 - 1.24 mg/dL   Glucose, Bld 324 (H) 70 - 99 mg/dL    Comment: Glucose reference range applies only to samples taken after fasting for at least 8 hours.   Calcium, Ion 1.10 (L) 1.15 - 1.40 mmol/L   TCO2 17 (L) 22 - 32 mmol/L   Hemoglobin 9.9 (L) 13.0 - 17.0 g/dL   HCT 40.1 (L) 02.7 - 25.3 %  Urinalysis, w/ Reflex to Culture (Infection Suspected) -Urine, Clean Catch     Status: Abnormal   Collection Time: 08/19/23  8:26 PM  Result Value Ref Range   Specimen Source URINE, CLEAN CATCH    Color, Urine YELLOW YELLOW   APPearance HAZY  (A) CLEAR   Specific Gravity, Urine 1.014 1.005 - 1.030  pH 5.0 5.0 - 8.0   Glucose, UA NEGATIVE NEGATIVE mg/dL   Hgb urine dipstick NEGATIVE NEGATIVE   Bilirubin Urine NEGATIVE NEGATIVE   Ketones, ur NEGATIVE NEGATIVE mg/dL   Protein, ur NEGATIVE NEGATIVE mg/dL   Nitrite NEGATIVE NEGATIVE   Leukocytes,Ua NEGATIVE NEGATIVE   RBC / HPF 0-5 0 - 5 RBC/hpf   WBC, UA 0-5 0 - 5 WBC/hpf    Comment:        Reflex urine culture not performed if WBC <=10, OR if Squamous epithelial cells >5. If Squamous epithelial cells >5 suggest recollection.    Bacteria, UA RARE (A) NONE SEEN   Squamous Epithelial / HPF 0-5 0 - 5 /HPF   Mucus PRESENT     Comment: Performed at [facility], [address] [place], [place] [address]  I-Stat Lactic Acid, ED     Status: Abnormal   Collection Time: 08/19/23  8:29 PM  Result Value Ref Range   Lactic Acid, Venous 2.8 (HH) 0.5 - 1.9 mmol/L   Comment NOTIFIED PHYSICIAN   I-Stat CG4 Lactic Acid, ED     Status: Abnormal   Collection Time: 08/19/23  8:34 PM  Result Value Ref Range   Lactic Acid, Venous 3.0 (HH) 0.5 - 1.9 mmol/L   Comment NOTIFIED PHYSICIAN   Troponin I (High Sensitivity)     Status: None   Collection Time: 08/19/23 10:09 PM  Result Value Ref Range   Troponin I (High Sensitivity) 11 <18 ng/L    Comment: (NOTE) Elevated high sensitivity troponin I (hsTnI) values and significant  changes across serial measurements may suggest ACS but many other  chronic and acute conditions are known to elevate hsTnI results.  Refer to the "Links" section for chest pain algorithms and additional  guidance. Performed at [facility], [address], [place], [place] [address]   I-Stat Lactic Acid, ED     Status: Abnormal   Collection Time: 08/19/23 10:15 PM  Result Value Ref Range   Lactic Acid, Venous 3.4 (HH) 0.5 - 1.9 mmol/L   Comment NOTIFIED PHYSICIAN    DG Chest 2 View Result Date: 08/19/2023 CLINICAL DATA:  cp EXAM: CHEST - 2 VIEW  COMPARISON:  Chest x-ray 08/13/2023, CT chest 03/14/2023 mama esophagram 08/13/2023 FINDINGS: Right chest wall Port-A-Cath with tip at the superior caval junction. The heart and mediastinal contours are within normal limits. Right lower lung zone airspace opacity. No pulmonary edema. At least small right pleural effusion. Trace left pleural effusion. No pneumothorax. No acute osseous abnormality. Right paramediastinal density suggestive of known thickened esophageal wall. Interval decrease in associated calcifications versus retained PO contrast. IMPRESSION: 1. At least small right pleural effusion. Underlying atelectasis versus infection. 2. Trace left pleural effusion. 3. Right paramediastinal density suggestive of known thickened esophageal wall. Interval decrease in associated calcifications versus retained PO contrast. Electronically Signed   By: Tish Frederickson M.D.   On: 08/19/2023 20:57    Review of Systems  Constitutional:  Negative for fever.  Respiratory:  Negative for shortness of breath.   Cardiovascular:  Positive for chest pain.    Blood pressure 109/70, pulse (!) 108, temperature 99.4 F (37.4 C), resp. rate 16, height 5\' 10"  (1.778 m), weight 77.1 kg, SpO2 90%. Physical Exam Constitutional:      Appearance: He is well-developed. He is ill-appearing.  HENT:     Head: Normocephalic and atraumatic.  Eyes:     General: No scleral icterus. Cardiovascular:  Rate and Rhythm: Regular rhythm. Tachycardia present.     Heart sounds: No murmur heard. Pulmonary:     Effort: Pulmonary effort is normal.     Comments: Diminished BS right base Abdominal:     General: There is no distension.     Palpations: Abdomen is soft.     Tenderness: There is no abdominal tenderness.  Musculoskeletal:     Cervical back: Neck supple.  Skin:    General: Skin is warm and dry.  Neurological:     General: No focal deficit present.     Mental Status: He is alert and oriented to person, place, and  time.     Cranial Nerves: No cranial nerve deficit.      Assessment/Plan 68 year-old man s/p esophagectomy 3 weeks ago presents with sudden onset right sided CP after eating this evening.  Low grade temp.  Mildly tachycardic, appears ill but not toxic.  Primary concern is for esophageal leak given relation to eating but would be unusual timing.  PE and pneumonia also in the differential given recent history.  Best initial option is to do a Ct with PE protocol and also give oral contrast to evaluate for possible esophageal leak.  He will need to be readmitted. IV hydration for acute kidney injury- creatinine 1.9 up from 1.5 yesterday Broad spectrum antibiotics- has received Vanco, Maxipime and Flagyl NPO for now Resume tube feedings DVT prophylaxis (assuming no PE on scan)  Loreli Slot, MD 08/19/2023, 11:15 PM

## 2023-08-19 NOTE — ED Notes (Signed)
 IV attempt x 2 unsuccessful.

## 2023-08-19 NOTE — ED Notes (Signed)
 Patient transported to CT

## 2023-08-19 NOTE — Progress Notes (Signed)
      [address]       [place] [address]             [phone]      22 Days Post-Op Procedure(s) (LRB): XI ROBOTIC ASSISTED IVOR LEWIS ESOPHAGECTOMY (N/A) EGD (ESOPHAGOGASTRODUODENOSCOPY) (N/A)  Subjective:  Patient feels great.  He is ready to go home today.  Supplies are in room  Objective: Vital signs in last 24 hours: Temp:  [98.8 F (37.1 C)-100.3 F (37.9 C)] 98.8 F (37.1 C) (04/01 0728) Pulse Rate:  [85-110] 98 (04/01 0728) Cardiac Rhythm: Normal sinus rhythm (03/31 1900) Resp:  [16-22] 22 (04/01 0728) BP: (118-142)/(67-78) 130/67 (04/01 0728) SpO2:  [95 %-99 %] 99 % (04/01 0728) Weight:  [77.4 kg] 77.4 kg (04/01 0632)  Intake/Output from previous day: 03/31 0701 - 04/01 0700 In: 238 [P.O.:238] Out: -   General appearance: alert, cooperative, and no distress Heart: regular rate and rhythm Lungs: clear to auscultation bilaterally Abdomen: soft, non-tender; bowel sounds normal; no masses,  no organomegaly Extremities: extremities normal, atraumatic, no cyanosis or edema Wound: clean and dry  Lab Results: Recent Labs    08/17/23 0500 08/18/23 0443  WBC 12.3* 11.3*  HGB 8.4* 7.9*  HCT 25.0* 24.0*  PLT 578* 513*   BMET:  Recent Labs    08/17/23 0500 08/18/23 0443  NA 133* 135  K 3.8 3.7  CL 105 106  CO2 20* 20*  GLUCOSE 227* 209*  BUN 42* 46*  CREATININE 1.41* 1.50*  CALCIUM 8.2* 8.1*    PT/INR: No results for input(s): "LABPROT", "INR" in the last 72 hours. ABG    Component Value Date/Time   PHART 7.4 07/29/2023 0514   HCO3 25.8 08/02/2023 2006   TCO2 24 08/02/2023 1506   ACIDBASEDEF 1.0 08/02/2023 1506   O2SAT 94.5 08/02/2023 2006   CBG (last 3)  Recent Labs    08/18/23 1620 08/18/23 2118 08/19/23 0631  GLUCAP 225* 267* 226*    Assessment/Plan: S/P Procedure(s) (LRB): XI ROBOTIC ASSISTED IVOR LEWIS ESOPHAGECTOMY (N/A) EGD (ESOPHAGOGASTRODUODENOSCOPY) (N/A)  CV- NSR, BP stable- continue Norvasc,  Lopressor Pulm-no acute issues, not on oxygen.. JP drain has removed GI-continue dysphagia 3 diet will advance as outpatient, continue tube feedings, protein supplements 4. DM- sugars are elevated at times.. continue insulin, will hold off on     resumption of Jardiance and Metformin with elevated creatinine level 5. Dispo- patient stable, will d/c home today...pharmacy was able to provide patient tube feedings until can receive delivery to house.. tube feeding pump/supplies in room   LOS: 22 days   [name], PA-C 08/19/2023

## 2023-08-19 NOTE — TOC Transition Note (Signed)
 Transition of Care [facility]) - Discharge Note   Patient Details  Name: Seth Burch MRN: [id number] Date of Birth: [birth year]  [facility] CM/SW Contact:  Lawerance Sabal, RN Phone Number: 08/19/2023, 9:21 AM   Clinical Narrative:     Notified [facility] of DC.  Notified [employer] of DC.  Tube feeding supplies delivered to the room yesterday. Reminded nurse to send home w 3 days of formula (in room).   Final next level of care: Home w Home Health Services Barriers to Discharge: No Barriers Identified   Patient Goals and CMS Choice Patient states their goals for this hospitalization and ongoing recovery are:: wants to get better CMS Medicare.gov Compare Post Acute Care list provided to:: Patient Represenative (must comment) Choice offered to / list presented to : Spouse Frederick ownership interest in [facility].provided to:: Spouse    Discharge Placement                       Discharge Plan and Services Additional resources added to the After Visit Summary for     Discharge Planning Services: CM Consult Post Acute Care Choice: IP Rehab          DME Arranged: Tube feeding DME Agency: [employer] Date DME Agency Contacted: 08/19/23 Time DME Agency Contacted: [phone] Representative spoke with at DME Agency: Mitch HH Arranged: PT, OT, RN, Speech Therapy HH Agency: [facility] Date [facility] Agency Contacted: 08/11/23 Time HH Agency Contacted: [phone] Representative spoke with at [facility] Agency: Amy  Social Drivers of Health (SDOH) Interventions SDOH Screenings   Food Insecurity: No Food Insecurity (07/29/2023)  Housing: Low Risk  (07/29/2023)  Transportation Needs: No Transportation Needs (07/29/2023)  Utilities: Not At Risk (07/29/2023)  Depression (PHQ2-9): Low Risk  (06/12/2023)  Social Connections: Moderately Integrated (07/29/2023)  Tobacco Use: Low Risk  (07/28/2023)     Readmission Risk Interventions     No data to display

## 2023-08-19 NOTE — Progress Notes (Signed)
 Pharmacy Antibiotic Note  Seth Burch is a 68 y.o. male admitted on 08/19/2023 with pneumonia.  Pharmacy has been consulted for vancomycin dosing.  Plan: {Assessment:21075}  Height: 5\' 10"  (177.8 cm) Weight: 77.1 kg (170 lb) IBW/kg (Calculated) : 73  Temp (24hrs), Avg:99.2 F (37.3 C), Min:98.8 F (37.1 C), Max:99.5 F (37.5 C)  Recent Labs  Lab 08/15/23 0329 08/16/23 0504 08/17/23 0500 08/18/23 0443 08/19/23 2018 08/19/23 2022 08/19/23 2029 08/19/23 2034 08/19/23 2215  WBC 11.6* 11.5* 12.3* 11.3* 15.7*  --   --   --   --   CREATININE 1.60* 1.50* 1.41* 1.50* 1.89* 1.90*  --   --   --   LATICACIDVEN  --   --   --   --   --   --  2.8* 3.0* 3.4*    Estimated Creatinine Clearance: 39.5 mL/min (A) (by C-G formula based on SCr of 1.9 mg/dL (H)).    No Known Allergies  Antimicrobials this admission: *** *** >> *** *** *** >> ***  Dose adjustments this admission: ***  Microbiology results: *** BCx: *** *** UCx: ***  *** Sputum: ***  *** MRSA PCR: ***  Thank you for allowing pharmacy to be a part of this patient's care.  Marja Kays 08/19/2023 11:54 PM

## 2023-08-19 NOTE — Sepsis Progress Note (Signed)
 Elink monitoring for the code sepsis protocol.  0115: Notified provider of need to order another lactic acid.

## 2023-08-19 NOTE — Progress Notes (Signed)
 Patient provided with verbal discharge instructions. Paper copy of discharge provided to patient. Paper script provided to patient. RN answered all questions. VSS at discharge. IV removed. Port deaccessed.  TF equipment at bedside. Wife confirmed 7 bags of tube feed taken home yesterday. Per PA, Erin Barrett -  ok to resume TF once home. Patient belongings sent with patient. Patient dc'd via wheelchair to private vehicle

## 2023-08-20 ENCOUNTER — Inpatient Hospital Stay (HOSPITAL_COMMUNITY): Admitting: Anesthesiology

## 2023-08-20 ENCOUNTER — Encounter (HOSPITAL_COMMUNITY): Payer: Self-pay | Admitting: Thoracic Surgery (Cardiothoracic Vascular Surgery)

## 2023-08-20 ENCOUNTER — Encounter (HOSPITAL_COMMUNITY)
Admission: EM | Disposition: A | Discharge status: Home Health | Payer: Self-pay | Source: Home / Self Care | Attending: Thoracic Surgery (Cardiothoracic Vascular Surgery)

## 2023-08-20 ENCOUNTER — Inpatient Hospital Stay (HOSPITAL_COMMUNITY)

## 2023-08-20 ENCOUNTER — Other Ambulatory Visit: Payer: Self-pay

## 2023-08-20 DIAGNOSIS — K9189 Other postprocedural complications and disorders of digestive system: Secondary | ICD-10-CM | POA: Diagnosis present

## 2023-08-20 DIAGNOSIS — J948 Other specified pleural conditions: Secondary | ICD-10-CM | POA: Diagnosis not present

## 2023-08-20 DIAGNOSIS — I10 Essential (primary) hypertension: Secondary | ICD-10-CM | POA: Diagnosis not present

## 2023-08-20 DIAGNOSIS — E119 Type 2 diabetes mellitus without complications: Secondary | ICD-10-CM

## 2023-08-20 DIAGNOSIS — J90 Pleural effusion, not elsewhere classified: Secondary | ICD-10-CM | POA: Diagnosis not present

## 2023-08-20 DIAGNOSIS — Z794 Long term (current) use of insulin: Secondary | ICD-10-CM | POA: Diagnosis not present

## 2023-08-20 HISTORY — PX: ESOPHAGOGASTRODUODENOSCOPY: SHX5428

## 2023-08-20 HISTORY — PX: VIDEO ASSISTED THORACOSCOPY (VATS)/DECORTICATION: SHX6171

## 2023-08-20 LAB — PROTIME-INR
INR: 1.2 (ref 0.8–1.2)
Prothrombin Time: 15.3 s — ABNORMAL HIGH (ref 11.4–15.2)

## 2023-08-20 LAB — CBC
HCT: 27 % — ABNORMAL LOW (ref 39.0–52.0)
Hemoglobin: 8.5 g/dL — ABNORMAL LOW (ref 13.0–17.0)
MCH: 30.4 pg (ref 26.0–34.0)
MCHC: 31.5 g/dL (ref 30.0–36.0)
MCV: 96.4 fL (ref 80.0–100.0)
Platelets: 497 10*3/uL — ABNORMAL HIGH (ref 150–400)
RBC: 2.8 MIL/uL — ABNORMAL LOW (ref 4.22–5.81)
RDW: 14.6 % (ref 11.5–15.5)
WBC: 12.3 10*3/uL — ABNORMAL HIGH (ref 4.0–10.5)
nRBC: 0 % (ref 0.0–0.2)

## 2023-08-20 LAB — GLUCOSE, CAPILLARY
Glucose-Capillary: 170 mg/dL — ABNORMAL HIGH (ref 70–99)
Glucose-Capillary: 197 mg/dL — ABNORMAL HIGH (ref 70–99)
Glucose-Capillary: 214 mg/dL — ABNORMAL HIGH (ref 70–99)
Glucose-Capillary: 243 mg/dL — ABNORMAL HIGH (ref 70–99)

## 2023-08-20 LAB — COMPREHENSIVE METABOLIC PANEL WITH GFR
ALT: 26 U/L (ref 0–44)
AST: 19 U/L (ref 15–41)
Albumin: 2 g/dL — ABNORMAL LOW (ref 3.5–5.0)
Alkaline Phosphatase: 217 U/L — ABNORMAL HIGH (ref 38–126)
Anion gap: 12 (ref 5–15)
BUN: 43 mg/dL — ABNORMAL HIGH (ref 8–23)
CO2: 15 mmol/L — ABNORMAL LOW (ref 22–32)
Calcium: 8.2 mg/dL — ABNORMAL LOW (ref 8.9–10.3)
Chloride: 104 mmol/L (ref 98–111)
Creatinine, Ser: 1.77 mg/dL — ABNORMAL HIGH (ref 0.61–1.24)
GFR, Estimated: 42 mL/min — ABNORMAL LOW (ref >60)
Glucose, Bld: 239 mg/dL — ABNORMAL HIGH (ref 70–99)
Potassium: 4.6 mmol/L (ref 3.5–5.1)
Sodium: 131 mmol/L — ABNORMAL LOW (ref 135–145)
Total Bilirubin: 0.6 mg/dL (ref 0.0–1.2)
Total Protein: 6.2 g/dL — ABNORMAL LOW (ref 6.5–8.1)

## 2023-08-20 LAB — I-STAT CG4 LACTIC ACID, ED
Lactic Acid, Venous: 1.9 mmol/L (ref 0.5–1.9)
Lactic Acid, Venous: 2.2 mmol/L (ref 0.5–1.9)

## 2023-08-20 SURGERY — VIDEO ASSISTED THORACOSCOPY (VATS)/DECORTICATION
Anesthesia: General | Laterality: Right

## 2023-08-20 MED ORDER — LIDOCAINE 2% (20 MG/ML) 5 ML SYRINGE
INTRAMUSCULAR | Status: AC
  Filled 2023-08-20: qty 5

## 2023-08-20 MED ORDER — FENTANYL CITRATE (PF) 250 MCG/5ML IJ SOLN
INTRAMUSCULAR | Status: AC
  Filled 2023-08-20: qty 5

## 2023-08-20 MED ORDER — VANCOMYCIN HCL 1000 MG IV SOLR
INTRAVENOUS | Status: AC
  Filled 2023-08-20: qty 20

## 2023-08-20 MED ORDER — PROPOFOL 10 MG/ML IV BOLUS
INTRAVENOUS | Status: DC | PRN
  Administered 2023-08-20: 40 mg via INTRAVENOUS
  Administered 2023-08-20: 50 mg via INTRAVENOUS

## 2023-08-20 MED ORDER — ROCURONIUM BROMIDE 10 MG/ML (PF) SYRINGE: PREFILLED_SYRINGE | INTRAVENOUS | Status: DC | PRN

## 2023-08-20 MED ORDER — ONDANSETRON HCL 4 MG/2ML IJ SOLN
INTRAMUSCULAR | Status: DC | PRN
  Administered 2023-08-20: 4 mg via INTRAVENOUS

## 2023-08-20 MED ORDER — PHENYLEPHRINE 80 MCG/ML (10ML) SYRINGE FOR IV PUSH (FOR BLOOD PRESSURE SUPPORT)
PREFILLED_SYRINGE | INTRAVENOUS | Status: AC
  Filled 2023-08-20: qty 10

## 2023-08-20 MED ORDER — ROCURONIUM BROMIDE 10 MG/ML (PF) SYRINGE
PREFILLED_SYRINGE | INTRAVENOUS | Status: AC
  Filled 2023-08-20: qty 10

## 2023-08-20 MED ORDER — LACTATED RINGERS IV SOLN: INTRAVENOUS | Status: DC

## 2023-08-20 MED ORDER — DEXAMETHASONE SODIUM PHOSPHATE 10 MG/ML IJ SOLN
INTRAMUSCULAR | Status: AC
  Filled 2023-08-20: qty 1

## 2023-08-20 MED ORDER — DEXMEDETOMIDINE HCL IN NACL 80 MCG/20ML IV SOLN
INTRAVENOUS | Status: AC
  Filled 2023-08-20: qty 20

## 2023-08-20 MED ORDER — ENOXAPARIN SODIUM 40 MG/0.4ML IJ SOSY
40.0000 mg | PREFILLED_SYRINGE | INTRAMUSCULAR | Status: DC
  Administered 2023-08-21 – 2023-09-09 (×20): 40 mg via SUBCUTANEOUS
  Filled 2023-08-20 (×20): qty 0.4

## 2023-08-20 MED ORDER — CHLORHEXIDINE GLUCONATE 0.12 % MT SOLN: 15.0000 mL | Freq: Once | OROMUCOSAL | Status: AC

## 2023-08-20 MED ORDER — PROPOFOL 10 MG/ML IV BOLUS
INTRAVENOUS | Status: AC
  Filled 2023-08-20: qty 20

## 2023-08-20 MED ORDER — SUGAMMADEX SODIUM 200 MG/2ML IV SOLN
INTRAVENOUS | Status: DC | PRN
  Administered 2023-08-20: 200 mg via INTRAVENOUS

## 2023-08-20 MED ORDER — ACETAMINOPHEN 10 MG/ML IV SOLN
INTRAVENOUS | Status: AC
  Filled 2023-08-20: qty 100

## 2023-08-20 MED ORDER — PROSOURCE TF20 ENFIT COMPATIBL EN LIQD
60.0000 mL | Freq: Two times a day (BID) | ENTERAL | Status: DC
  Administered 2023-08-21 – 2023-08-22 (×4): 60 mL
  Filled 2023-08-20 (×4): qty 60

## 2023-08-20 MED ORDER — VANCOMYCIN HCL 1000 MG IV SOLR
INTRAVENOUS | Status: DC | PRN
Start: 2023-08-20 — End: 2023-08-20
  Administered 2023-08-20: 1000 mg

## 2023-08-20 MED ORDER — FENTANYL CITRATE (PF) 250 MCG/5ML IJ SOLN
INTRAMUSCULAR | Status: DC | PRN
  Administered 2023-08-20: 100 ug via INTRAVENOUS
  Administered 2023-08-20: 50 ug via INTRAVENOUS
  Administered 2023-08-20 (×2): 25 ug via INTRAVENOUS

## 2023-08-20 MED ORDER — ACETAMINOPHEN 160 MG/5ML PO SOLN: 650.0000 mg | ORAL | Status: DC | PRN

## 2023-08-20 MED ORDER — BUPIVACAINE LIPOSOME 1.3 % IJ SUSP
INTRAMUSCULAR | Status: DC | PRN
  Administered 2023-08-20: 50 mL

## 2023-08-20 MED ORDER — PHENYLEPHRINE HCL-NACL 20-0.9 MG/250ML-% IV SOLN
INTRAVENOUS | Status: AC
  Filled 2023-08-20: qty 250

## 2023-08-20 MED ORDER — INSULIN ASPART 100 UNIT/ML IJ SOLN
0.0000 [IU] | INTRAMUSCULAR | Status: DC | PRN
  Administered 2023-08-20: 2 [IU] via SUBCUTANEOUS

## 2023-08-20 MED ORDER — GERHARDT'S BUTT CREAM
1.0000 | TOPICAL_CREAM | Freq: Three times a day (TID) | CUTANEOUS | Status: DC
Start: 2023-08-20 — End: 2023-09-15
  Administered 2023-08-20 – 2023-09-14 (×68): 1 via TOPICAL
  Filled 2023-08-20 (×3): qty 60

## 2023-08-20 MED ORDER — SODIUM CHLORIDE 0.9 % IV SOLN: INTRAVENOUS | Status: DC | PRN

## 2023-08-20 MED ORDER — ACETAMINOPHEN 10 MG/ML IV SOLN
INTRAVENOUS | Status: DC | PRN
  Administered 2023-08-20: 1000 mg via INTRAVENOUS

## 2023-08-20 MED ORDER — HYDROMORPHONE HCL 1 MG/ML IJ SOLN: 0.2500 mg | INTRAMUSCULAR | Status: DC | PRN

## 2023-08-20 MED ORDER — SODIUM CHLORIDE (PF) 0.9 % IJ SOLN
INTRAMUSCULAR | Status: AC
  Filled 2023-08-20: qty 50

## 2023-08-20 MED ORDER — 0.9 % SODIUM CHLORIDE (POUR BTL) OPTIME
TOPICAL | Status: DC | PRN
  Administered 2023-08-20: 2000 mL

## 2023-08-20 MED ORDER — LACTATED RINGERS IV SOLN: INTRAVENOUS | Status: DC | PRN

## 2023-08-20 MED ORDER — HYDRALAZINE HCL 20 MG/ML IJ SOLN: 10.0000 mg | Freq: Four times a day (QID) | INTRAMUSCULAR | Status: DC | PRN

## 2023-08-20 MED ORDER — ONDANSETRON HCL 4 MG/2ML IJ SOLN
4.0000 mg | Freq: Four times a day (QID) | INTRAMUSCULAR | Status: DC | PRN
  Administered 2023-09-11: 4 mg via INTRAVENOUS
  Filled 2023-08-20: qty 2

## 2023-08-20 MED ORDER — TAMSULOSIN HCL 0.4 MG PO CAPS
0.4000 mg | ORAL_CAPSULE | Freq: Every day | ORAL | Status: DC
  Administered 2023-08-21 – 2023-09-15 (×26): 0.4 mg via ORAL
  Filled 2023-08-20 (×26): qty 1

## 2023-08-20 MED ORDER — DEXAMETHASONE SODIUM PHOSPHATE 10 MG/ML IJ SOLN
INTRAMUSCULAR | Status: DC | PRN
  Administered 2023-08-20: 10 mg via INTRAVENOUS

## 2023-08-20 MED ORDER — MIDAZOLAM HCL 2 MG/2ML IJ SOLN: 0.5000 mg | Freq: Once | INTRAMUSCULAR | Status: DC | PRN

## 2023-08-20 MED ORDER — BUPIVACAINE HCL (PF) 0.5 % IJ SOLN
INTRAMUSCULAR | Status: AC
  Filled 2023-08-20: qty 30

## 2023-08-20 MED ORDER — PANTOPRAZOLE SODIUM 40 MG IV SOLR
40.0000 mg | INTRAVENOUS | Status: DC
  Administered 2023-08-20 – 2023-08-30 (×11): 40 mg via INTRAVENOUS
  Filled 2023-08-20 (×11): qty 10

## 2023-08-20 MED ORDER — OXYCODONE HCL 5 MG/5ML PO SOLN
5.0000 mg | ORAL | Status: DC | PRN
  Administered 2023-08-21 – 2023-08-26 (×10): 5 mg
  Filled 2023-08-20 (×11): qty 5

## 2023-08-20 MED ORDER — INSULIN ASPART 100 UNIT/ML IJ SOLN
0.0000 [IU] | INTRAMUSCULAR | Status: DC
  Administered 2023-08-20 – 2023-08-21 (×2): 5 [IU] via SUBCUTANEOUS
  Administered 2023-08-21: 3 [IU] via SUBCUTANEOUS
  Administered 2023-08-21: 15 [IU] via SUBCUTANEOUS
  Administered 2023-08-21: 5 [IU] via SUBCUTANEOUS
  Administered 2023-08-21 – 2023-08-22 (×2): 11 [IU] via SUBCUTANEOUS
  Administered 2023-08-22: 8 [IU] via SUBCUTANEOUS
  Administered 2023-08-22: 5 [IU] via SUBCUTANEOUS
  Administered 2023-08-22: 8 [IU] via SUBCUTANEOUS
  Administered 2023-08-22: 2 [IU] via SUBCUTANEOUS
  Administered 2023-08-22: 5 [IU] via SUBCUTANEOUS
  Administered 2023-08-23: 2 [IU] via SUBCUTANEOUS

## 2023-08-20 MED ORDER — SODIUM CHLORIDE 0.9 % IV SOLN: INTRAVENOUS | Status: AC

## 2023-08-20 MED ORDER — METOPROLOL TARTRATE 25 MG/10 ML ORAL SUSPENSION
12.5000 mg | Freq: Two times a day (BID) | ORAL | Status: DC
  Administered 2023-08-21: 12.5 mg
  Filled 2023-08-20: qty 5

## 2023-08-20 MED ORDER — OXYCODONE HCL 5 MG PO TABS: 5.0000 mg | ORAL_TABLET | Freq: Once | ORAL | Status: DC | PRN

## 2023-08-20 MED ORDER — ORAL CARE MOUTH RINSE
15.0000 mL | Freq: Once | OROMUCOSAL | Status: AC
  Administered 2023-08-20: 15 mL via OROMUCOSAL

## 2023-08-20 MED ORDER — VIVONEX RTF PO LIQD
1000.0000 mL | ORAL | Status: DC
  Administered 2023-08-20: 1000 mL via JEJUNOSTOMY
  Filled 2023-08-20 (×2): qty 1000

## 2023-08-20 MED ORDER — VANCOMYCIN HCL IN DEXTROSE 1-5 GM/200ML-% IV SOLN
1000.0000 mg | INTRAVENOUS | Status: DC
  Administered 2023-08-21: 1000 mg via INTRAVENOUS
  Filled 2023-08-20: qty 200

## 2023-08-20 MED ORDER — INSULIN GLARGINE-YFGN 100 UNIT/ML ~~LOC~~ SOLN
26.0000 [IU] | Freq: Every day | SUBCUTANEOUS | Status: DC
  Administered 2023-08-21: 26 [IU] via SUBCUTANEOUS
  Filled 2023-08-20 (×2): qty 0.26

## 2023-08-20 MED ORDER — ROCURONIUM BROMIDE 10 MG/ML (PF) SYRINGE
PREFILLED_SYRINGE | INTRAVENOUS | Status: DC | PRN
  Administered 2023-08-20 (×2): 10 mg via INTRAVENOUS
  Administered 2023-08-20: 70 mg via INTRAVENOUS

## 2023-08-20 MED ORDER — OXYCODONE HCL 5 MG/5ML PO SOLN: 5.0000 mg | Freq: Once | ORAL | Status: DC | PRN

## 2023-08-20 MED ORDER — PHENYLEPHRINE 80 MCG/ML (10ML) SYRINGE FOR IV PUSH (FOR BLOOD PRESSURE SUPPORT)
PREFILLED_SYRINGE | INTRAVENOUS | Status: DC | PRN
Start: 2023-08-20 — End: 2023-08-20
  Administered 2023-08-20 (×2): 160 ug via INTRAVENOUS

## 2023-08-20 SURGICAL SUPPLY — 76 items
BLADE CLIPPER SURG (BLADE) IMPLANT
BLADE SURG 11 STRL SS (BLADE) ×2 IMPLANT
BUTTON OLYMPUS DEFENDO 5 PIECE (MISCELLANEOUS) ×2 IMPLANT
CANISTER SUCT 3000ML PPV (MISCELLANEOUS) ×2 IMPLANT
CATH THORACIC 28FR (CATHETERS) IMPLANT
CATH THORACIC 36FR (CATHETERS) IMPLANT
CHLORAPREP W/TINT 26 (MISCELLANEOUS) ×2 IMPLANT
CLIP TI MEDIUM 6 (CLIP) IMPLANT
CNTNR URN SCR LID CUP LEK RST (MISCELLANEOUS) ×4 IMPLANT
DEFOGGER SCOPE WARMER CLEARIFY (MISCELLANEOUS) ×2 IMPLANT
DERMABOND ADVANCED .7 DNX12 (GAUZE/BANDAGES/DRESSINGS) ×2 IMPLANT
DISSECTOR BLUNT TIP ENDO 5MM (MISCELLANEOUS) IMPLANT
DRAIN CHANNEL 28F RND 3/8 FF (WOUND CARE) IMPLANT
DRAIN CONNECTOR BLAKE 1:1 (MISCELLANEOUS) IMPLANT
DRAPE CV SPLIT W-CLR ANES SCRN (DRAPES) ×2 IMPLANT
DRAPE SURG ORHT 6 SPLT 77X108 (DRAPES) ×2 IMPLANT
ELECT BLADE 4.0 EZ CLEAN MEGAD (MISCELLANEOUS) ×2 IMPLANT
ELECT REM PT RETURN 9FT ADLT (ELECTROSURGICAL) ×2 IMPLANT
ELECTRODE BLDE 4.0 EZ CLN MEGD (MISCELLANEOUS) ×2 IMPLANT
ELECTRODE REM PT RTRN 9FT ADLT (ELECTROSURGICAL) ×2 IMPLANT
GAUZE SPONGE 4X4 12PLY STRL (GAUZE/BANDAGES/DRESSINGS) ×2 IMPLANT
GLOVE BIO SURGEON STRL SZ7 (GLOVE) ×2 IMPLANT
GLOVE BIO SURGEON STRL SZ7.5 (GLOVE) ×4 IMPLANT
GOWN STRL REUS W/ TWL LRG LVL3 (GOWN DISPOSABLE) ×4 IMPLANT
GOWN STRL REUS W/ TWL XL LVL3 (GOWN DISPOSABLE) ×2 IMPLANT
KIT BASIN OR (CUSTOM PROCEDURE TRAY) ×2 IMPLANT
KIT SUCTION CATH 14FR (SUCTIONS) ×2 IMPLANT
KIT TURNOVER KIT B (KITS) ×2 IMPLANT
MARKER SKIN DUAL TIP RULER LAB (MISCELLANEOUS) ×2 IMPLANT
NDL 18GX1X1/2 (RX/OR ONLY) (NEEDLE) ×2 IMPLANT
NDL HYPO 22X1.5 SAFETY MO (MISCELLANEOUS) ×2 IMPLANT
NDL SUT 6 .5 CRC .975X.05 MAYO (NEEDLE) IMPLANT
NEEDLE 18GX1X1/2 (RX/OR ONLY) (NEEDLE) ×2 IMPLANT
NEEDLE HYPO 22X1.5 SAFETY MO (MISCELLANEOUS) ×2 IMPLANT
NS IRRIG 1000ML POUR BTL (IV SOLUTION) ×4 IMPLANT
OIL SILICONE PENTAX (PARTS (SERVICE/REPAIRS)) IMPLANT
PACK CHEST (CUSTOM PROCEDURE TRAY) ×2 IMPLANT
PAD ARMBOARD POSITIONER FOAM (MISCELLANEOUS) ×4 IMPLANT
PASSER SUT SWANSON 36MM LOOP (INSTRUMENTS) IMPLANT
PENCIL BUTTON HOLSTER BLD 10FT (ELECTRODE) IMPLANT
SCISSORS LAP 5X35 DISP (ENDOMECHANICALS) IMPLANT
SPONGE TONSIL 1 RF SGL (DISPOSABLE) IMPLANT
STENT WALLFLEX ESOPH 23X125MM (Stent) IMPLANT
STOPCOCK 4 WAY LG BORE MALE ST (IV SETS) IMPLANT
SUT ETHILON O TP 1 (SUTURE) IMPLANT
SUT PROLENE 3 0 SH DA (SUTURE) IMPLANT
SUT PROLENE 4-0 RB1 .5 CRCL 36 (SUTURE) IMPLANT
SUT SILK 1 MH (SUTURE) ×2 IMPLANT
SUT SILK 1 TIES 10X30 (SUTURE) IMPLANT
SUT SILK 2 0SH CR/8 30 (SUTURE) IMPLANT
SUT SILK 3 0SH CR/8 30 (SUTURE) IMPLANT
SUT VIC AB 1 CTX 18 (SUTURE) IMPLANT
SUT VIC AB 1 CTX36XBRD ANBCTR (SUTURE) IMPLANT
SUT VIC AB 2-0 CT1 TAPERPNT 27 (SUTURE) ×2 IMPLANT
SUT VIC AB 3-0 SH 27X BRD (SUTURE) ×2 IMPLANT
SUT VICRYL 0 UR6 27IN ABS (SUTURE) ×2 IMPLANT
SWAB COLLECTION DEVICE MRSA (MISCELLANEOUS) IMPLANT
SWAB CULTURE ESWAB REG 1ML (MISCELLANEOUS) IMPLANT
SYR 10ML LL (SYRINGE) IMPLANT
SYR 20ML ECCENTRIC (SYRINGE) ×2 IMPLANT
SYR 20ML LL LF (SYRINGE) ×2 IMPLANT
SYR 30ML SLIP (SYRINGE) IMPLANT
SYR 50ML LL SCALE MARK (SYRINGE) IMPLANT
SYSTEM SAHARA CHEST DRAIN ATS (WOUND CARE) ×2 IMPLANT
TAPE CLOTH 4X10 WHT NS (GAUZE/BANDAGES/DRESSINGS) ×2 IMPLANT
TAPE CLOTH SURG 4X10 WHT LF (GAUZE/BANDAGES/DRESSINGS) IMPLANT
TOWEL GREEN STERILE (TOWEL DISPOSABLE) ×4 IMPLANT
TOWEL GREEN STERILE FF (TOWEL DISPOSABLE) ×2 IMPLANT
TRAY FOLEY SLVR 16FR LF STAT (SET/KITS/TRAYS/PACK) ×2 IMPLANT
TROCAR Z THREAD OPTICAL 12X100 (TROCAR) IMPLANT
TUBE CONNECTING 20X1/4 (TUBING) ×2 IMPLANT
TUBING ENDO SMARTCAP (MISCELLANEOUS) ×2 IMPLANT
TUBING EXTENTION W/L.L. (IV SETS) IMPLANT
UNDERPAD 30X36 HEAVY ABSORB (UNDERPADS AND DIAPERS) ×2 IMPLANT
WATER STERILE IRR 1000ML POUR (IV SOLUTION) ×2 IMPLANT
WIRE HYDRA 450CM (MISCELLANEOUS) IMPLANT

## 2023-08-20 NOTE — Discharge Summary (Signed)
 Physician Discharge Summary  Patient ID: ADEBOWALE DRAYTON MRN: [id number] DOB/AGE: 1956/01/23 68 y.o.  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) 03/11/2023   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   Discharge Diagnoses:   Patient Active Problem List   Diagnosis Date Noted   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   Anemia of chronic disease 09/15/2023   SIRS (systemic inflammatory response syndrome) (HCC) 09/02/2023   Jejunostomy tube leak (HCC) 08/27/2023   Protein-calorie malnutrition, severe 08/22/2023   Esophagectomy, anastomotic leak 08/20/2023   Chest pain 08/19/2023   Malnutrition of moderate degree 07/30/2023   Esophageal cancer (HCC) 07/28/2023   H/O esophagectomy 07/28/2023   Chemotherapy-induced thrombocytopenia 05/22/2023   Port-A-Cath in place 05/02/2023   Leukopenia due to antineoplastic chemotherapy (HCC) 05/02/2023   CKD (chronic kidney disease) stage 3, GFR 30-59 ml/min (HCC) 04/24/2023   Dysphagia 03/27/2023   Unintentional weight loss 03/27/2023   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 Clinical stage III. He underwent EUS on 04/04/2023 by Dr. Nickey Barn and was felt to be at least T3, N1/2, MX. He underwent radiation and chemotherapy in December with last dose being 05/30/2023. Restaging PET CT scan was obtained on 06/30/2023, which showed response with substantial improvement in hypermetabolic activity within the distal esophagus consistent with a positive response to therapy. He followed up with Dr. Deloise Ferries on 07/11/2023 at which time the patient was able to eat without complications and was able to put on some weight. He was offered a Robotic Assisted Ivor Lewis Esophagectomy.  This was performed by Dr. Deloise Ferries on 07/28/2023.  His hospital course was complicated by pain, AKI on CKD, decreased oral intake, and fever.  He developed mild leukocytosis and started on antibiotics, however when no leak was detected these were discontinued.  Repeat esophogram showed no evidence of esophageal leak despite purulent drainage from JP drain.  This was removed per Dr. Raina Bunting instruction prior to patient's discharge on 08/19/2023.  Unfortunately post discharge yesterday the patient developed right sided pain.  Workup in the ED consisted of CT of the chest which did infact reveal evidence of right hydopneumothorax and gas collection with disruption of anastomotic repair, consistence with esophageal leak.  He was admitted for further care.  Hospital Course:  He was initiated on IV fluids for elevated creatinine of 1.9.  He was started on broad spectrum antibiotics Vanc, Maxipime , and flagyl .  He was resumed on tube feedings and made NPO.  He was taken the operating room and underwent Right VATS procedure with placement of chest tube and EGD with stent placement of esophageal leak.  Postoperative Hospital course:  The  patient has shown good overall progression and clinical status.  He was  began on broad-spectrum antibiotics including antifungals which will be narrowed at time of culture results.  He has remained afebrile.  Leukocytosis has trended lower.  He continues to have a moderate anemia but this has stabilized.  Does not at the transfusion threshold.  Tube feeds and dysphagia 1 diet were reinitiated and the nutritionist assisted with management.  Chest x-ray appearance as shown continued improvement in resolution of airspace disease.  Chest tube will be kept in place at discharge.  The tube feeding was modified to a more concentrated formula as recommended by the dietitian.  As the rate was titrated up, he began to have leakage of the tube feeding around the J-tube insertion site.  We attempted to manage this by holding the tube feeding for 4 to 5 hours then resuming at a slower rate but the leakage continued so the tube feeding was discontinued.  Dysphagia 1 diet was continued.  Late in the day on postop day 3, he developed a low-grade fever triggering a MEWS score of 4.  He was transferred back to the ICU.  He remained hemodynamically stable and also had an stable respiratory status.  Large volume of cloudy drainage from the chest tube persisted.  Cultures obtained of the pleural fluid obtained intraoperatively grew lactobacillus. The pharmacist recommended Infectious Disease consult for antibiotic guidance since Lactobacillus was not well covered with Levaquin . ID recommended to transition his Levaquin  to Unasyn .  The patient's j tube was re-inflated and he was resumed on tube feedings on 08/25/2023.  He unfortunately developed abdominal pain/gaseous distention.  He underwent J tube exchange on 08/27/2023 and his tube feeds were resumed.  He continued to have abdominal bloating.  Abdominal x ray was obtained and showed no stool burden and appropriate placement of J Tube.  Despite this patient was still unable to tolerate tube feedings, leakage persisted around J Tube.  He was started on TPN and  underwent repeat J tube exchange with a large tube.  He developed purulent drainage from his thoracotomy site and a Prevena dressing was placed. Abdominal distension and discomfort improved, trickle tube feeds were restarted on 04/12 and tolerated well. 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  Tube feeds were advanced to full strength and ultimately changed to a lower fat content version which she is tolerating.  TPN has been discontinued.  He has continued he is send and had intermittent fevers.  He is not having any leukocytosis. Drainage from the thoracotomy incision under the Praveena did develop increasing purulent type drainage has been recultured.  He did get transfused for hemoglobin of 6.8. He had diarrhea on 04/19 so tube feedings were decreased.  The patients chylothorax persists.  Interventional Radiology was consulted for possible embolization.  They felt intervention would be appropriate.  This was performed on 4/24 with coil and glue embolization of the thoracic duct.  He tolerated the procedure without difficulty.  CT output has slowly decreased initially going towards a serous drainage but has subsequently become somewhat more milky.  The patient developed drainage from his right chest around the chest tube site.  This appeared to smell like tube feedings.  He will require frequent dressing changes.  His chest tube will be connected to a Mini Express Pleurovac.  This will be monitored and removed as an outpatient once appropriate.  The patient's oral intake remains poor.  He is currently on tube feedings at 34ml/hr.  He continues to have 1 episode of diarrhea per day.  He has mild leukocytosis of 12.6, but remains afebrile.  He is maintaining NSR.  His blood sugars have remained well controlled.  Will discharge  patient home today per Dr. Raina Bunting instruction with follow up scheduled for this Friday.   Consults:  interventional radiology, Critical care  Significant Diagnostic Studies: radiology:   CT scan:    FINDINGS: CT CHEST FINDINGS   Cardiovascular: Normal heart size. No pericardial effusion. Mild aortic atherosclerotic calcification. No pulmonary embolism. No aortic dissection. Right chest wall Port-A-Cath tip at the superior cavoatrial junction.   Mediastinum/Nodes: Postoperative change of esophagectomy. Question disruption of the right posterior neoesophagus at the level of T5-T6 (series 5/image 80). Extraluminal contrast is present within the fluid and gas collection along the posteromedial right upper and lower lobes.   Trachea is patent.  No lymphadenopathy.   Lungs/Pleura: Loculated right hydropneumothorax. Extraluminal enteric contrast is present within the fluid and gas collections along the posteromedial right upper and lower lobes. The contrast tracks around the posterior right lower lobe is present in the posterolateral thick walled fluid collection extending into the minor fissure. Additional loculated fluid collection along the medial right middle lobe. Loculated fluid and gas collections along the anterolateral right upper lobe. Gas-filled track extending from the right upper lobe inferiorly along the course of the removed chest tube.   Small left pleural effusion and associated atelectasis.   Musculoskeletal: No acute fracture.   Review of the MIP images confirms the above findings.   CT ABDOMEN PELVIS FINDINGS   Hepatobiliary: Hepatic steatosis.  No acute abnormality.   Pancreas: Unremarkable.   Spleen: Unremarkable.   Adrenals/Urinary Tract: Normal adrenal glands. No urinary calculi or hydronephrosis. Mild diffuse bladder wall thickening.   Stomach/Bowel: Circumferential rectal wall thickening. Normal caliber large and small bowel. Normal  appendix.   Vascular/Lymphatic: Aortic atherosclerotic calcification. No lymphadenopathy.   Reproductive: Enlarged prostate and denting the floor of the bladder.   Other: No intra-abdominal or pelvic abscess. No free air. Jejunostomy tube with tip in the jejunum in the left upper quadrant.   Musculoskeletal: No acute fracture.   IMPRESSION: 1. 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 Possible site of disruption along the right posterior neo esophagus the level of T5-T6. 2. Small left pleural effusion and associated atelectasis. 3. Circumferential rectal wall thickening, suspicious for proctitis. Neoplasm is difficult to exclude. Recommend correlation with colonoscopy when clinically appropriate. 4. Mild diffuse bladder wall thickening, possibly due to chronic bladder outlet obstruction given enlarged prostate. 5. Hepatic steatosis. 6. Aortic Atherosclerosis (ICD10-I70.0).   These results were called by telephone at the time of interpretation on 08/20/2023 at 12:32 am to provider Dr. Eliseo Guiles, who verbally acknowledged these results.     Electronically Signed   By: Rozell Cornet M.D.   On: 08/20/2023 00:35  Treatments: surgery:   08/20/2023 Patient:  MAUD TORREZ Pre-Op Dx: Right pleural effusion                         History of minimally invasive esophagectomy                         Anastomotic Post-op Dx: Same Procedure: -Right video  assisted thoracoscopy - Decortication - Esophagogastroscopy -125 mm covered esophageal stent placement Surgeon and Role:      * Lightfoot, Marinell Siad, MD - Primary       Anesthesia  general EBL: 100 ml Blood Administration: None Specimen: None   Drains: 19 F Blake chest tube in right chest Counts: correct    Interventional Radiology Procedure Note   Procedure:    US  guided left and right inguinal node needle access, for lipiodol lymphangiogram.   Fluoro guided fenestration of the thoracic duct.  US  guided left basilic vein access Retrograde cannulation of the thoracic duct. Coil and glue embolization of the thoracic duct with NBCA.    EBL: None Anesthesia: GETA   Complications: None   Recommendations:  - to PACU - routine wound care  - follow chest tube output, anticipate few days of ongoing output as the duct heals/seals - VIR to follow - Do not submerge for 7 days     Signed,   Marciano Settles. Mabel Savage, DO   Discharge Exam: Blood pressure 126/73, pulse 98, temperature 98.3 F (36.8 C), temperature source Oral, resp. rate (!) 29, height 5\' 10"  (1.778 m), weight 76.1 kg, SpO2 93%.  General appearance: alert, cooperative, and no distress Heart: regular rate and rhythm Lungs: clear to auscultation bilaterally Abdomen: soft, non-tender, non-distended.. J tube has mild greenish drainage from around J tube.. unchanged Extremities: extremities normal, atraumatic, no cyanosis or edema Wound: extensive drainage along right chest, smells like tube feeding  Discharge disposition: 01-Home or Self Care   Allergies as of 09/15/2023   No Known Allergies      Medication List     STOP taking these medications    aspirin EC 81 MG tablet   Icy Hot Original Pain Relief 10-30 % Crea       TAKE these medications    acetaminophen  325 MG tablet Commonly known as: Tylenol  Take 1-2 tablets (325-650 mg total) by mouth every 4 (four) hours as needed.   amLODipine  10 MG tablet Commonly known as: NORVASC  Take 1 tablet (10 mg total) by mouth daily.   atorvastatin  20 MG tablet Commonly known as: LIPITOR Take 20 mg by mouth at bedtime.   feeding supplement (PROSource TF20) liquid Place 60 mLs into feeding tube 2 (two) times daily.   fiber Pack packet Place 1 packet into feeding tube 2 (two) times daily.   Gerhardt's butt cream Crea Apply 1 Application topically 3 (three) times daily.   Lantus  SoloStar 100 UNIT/ML Solostar  Pen Generic drug: insulin  glargine Inject 14 Units into the skin daily. What changed: how much to take   medium chain triglycerides  oil Commonly known as: MCT OIL Place 15 mLs into feeding tube 3 (three) times daily.   metoprolol  tartrate 25 MG tablet Commonly known as: LOPRESSOR  Take 1 tablet (25 mg total) by mouth 2 (two) times daily. What changed: how much to take   multivitamin with minerals Tabs tablet Take 1 tablet by mouth daily.   omeprazole 40 MG capsule Commonly known as: PRILOSEC Take 40 mg by mouth daily.   oxyCODONE  5 MG immediate release tablet Commonly known as: Oxy IR/ROXICODONE  Take 1 tablet (5 mg total) by mouth every 4 (four) hours as needed for severe pain (pain score 7-10).   saccharomyces boulardii 250 MG capsule Commonly known as: FLORASTOR Take 1 capsule (250 mg total) by mouth 2 (two) times daily.   tamsulosin  0.4 MG Caps capsule Commonly known as: FLOMAX  Take 0.4 mg by mouth  daily.   thiamine  100 MG tablet Commonly known as: Vitamin B-1 Take 1 tablet (100 mg total) by mouth daily.   Vivonex RTF Liqd Place 1,000 mLs into feeding tube continuous.        Follow-up Information     [facility] Follow up.   Why: (Adapt) home Tube feedings (Call to arranged tube feeding that hasn't been opened for refund) Contact information: [address] [place] [address] [phone]         [facility] Follow up on 09/19/2023.   Specialty: Cardiothoracic Surgery Why: Appointment is with Dr. Deloise Ferries at 11:30 Contact information: [address] [place]  [address] [phone]        [facility] Follow up.   Why: [facility] RN/PT services will resume Contact information: [address] [place] [address] [phone]                 Signed: [name], PA-C  09/15/2023,  10:27 AM

## 2023-08-20 NOTE — Transfer of Care (Signed)
 Immediate Anesthesia Transfer of Care Note  Patient: Seth Burch  Procedure(s) Performed: VIDEO ASSISTED THORACOSCOPY (VATS)/DECORTICATION (Right) EGD (ESOPHAGOGASTRODUODENOSCOPY)  Patient Location: PACU  Anesthesia Type:General  Level of Consciousness: alert , drowsy, patient cooperative, and responds to stimulation  Airway & Oxygen Therapy: Patient Spontanous Breathing and Patient connected to nasal cannula oxygen  Post-op Assessment: Report given to RN and Post -op Vital signs reviewed and stable  Post vital signs: Reviewed and stable  Last Vitals:  Vitals Value Taken Time  BP 116/63 08/20/23 1530  Temp 98.8   Pulse 97 08/20/23 1532  Resp 22 08/20/23 1532  SpO2 96 % 08/20/23 1532  Vitals shown include unfiled device data.  Last Pain:  Vitals:   08/20/23 1237  TempSrc:   PainSc: 4       Patients Stated Pain Goal: 3 (08/20/23 1237)  Complications: No notable events documented.

## 2023-08-20 NOTE — H&P (Signed)
     [address]       [name] [address]             [phone]       OR today for R VATS, EGD and esophageal stent placement  Taino Maertens O Breezie Micucci

## 2023-08-20 NOTE — ED Notes (Signed)
 Cardiothoracic surgeon rounding at bedside

## 2023-08-20 NOTE — Op Note (Signed)
     [address]       [name] [address]             [phone]       08/20/2023 Patient:  Seth Burch Pre-Op Dx: Right pleural effusion   History of minimally invasive esophagectomy   Anastomotic Post-op Dx: Same Procedure: -Right video assisted thoracoscopy - Decortication - Esophagogastroscopy -125 mm covered esophageal stent placement Surgeon and Role:      * Chauncey Sciulli, Eliezer Lofts, MD - Primary      Anesthesia  general EBL: 100 ml Blood Administration: None Specimen: None  Drains: 19 F Blake chest tube in right chest Counts: correct    Indications: 67 year old male with invasive esophagectomy over 3 weeks ago.  He recently was discharged from the hospital but acute onset of right-sided chest pain, and thus came to the emergency department.  Cross-sectional imaging showed concern for an esophageal leak at the anastomotic site with a pleural effusion. Findings: Murky purulent fluid in the right chest.  This was coming from the posterior gutter.  During the upper endoscopy there was food particles in he will, not at the anastomosis.  I did not see an obvious disruption of the anastomosis.  The covered stent was placed across the anastomosis.  Operative Technique: After the risks, benefits and alternatives were thoroughly discussed, the patient was brought to the operative theatre.  Anesthesia was induced, the patient was then placed in a left decubitus position and was prepped and draped in normal sterile fashion.  An appropriate surgical pause was performed, and pre-operative antibiotics were dosed accordingly.  We began with 2cm incision in the previous access incision.  The chest was entered, and we then placed a 1cm incision at the 10th intercostal space, and introduced our camera port.  The lung was directly visualized.  There was murky purulent fluid.   The lung was then mobilized off of the chest wall. We achieved good expansion of the lung.  The  chest was then irrigated.    A 19 F chest tube was then placed, and we watch the lung re-expand.  The skin and soft tissue were closed with absorbable suture    The patient was then placed in the supine position.  The gastroscope was advanced through the oropharynx into the cervical esophagus under direct visualization.  At the level of the anastomosis there was food content as well as some undissolved pill.  This was copiously irrigated and forced into the stomach.  The scope was then pulled back, and the esophageal mucosa was visualized.  There was no obvious defect at the anastomosis.  The anastomosis was marked on the skin with paper clips.    Next a Jag wire was passed through the gastroscope into the stomach with fluoroscopic guidance.  The esophageal stent was then placed over the wire, and positioned under fluoroscopy to cover the anastomosis.  The patient tolerated the procedure without any immediate complications, and was transferred to the PACU in stable condition.  [name] Keane Scrape

## 2023-08-20 NOTE — Anesthesia Postprocedure Evaluation (Signed)
 Anesthesia Post Note  Patient: Seth Burch  Procedure(s) Performed: VIDEO ASSISTED THORACOSCOPY (VATS)/DECORTICATION (Right) EGD (ESOPHAGOGASTRODUODENOSCOPY)     Patient location during evaluation: PACU Anesthesia Type: General Level of consciousness: awake and alert, patient cooperative and oriented Pain management: pain level controlled Vital Signs Assessment: post-procedure vital signs reviewed and stable Respiratory status: spontaneous breathing, nonlabored ventilation, respiratory function stable and patient connected to nasal cannula oxygen Cardiovascular status: blood pressure returned to baseline and stable Postop Assessment: no apparent nausea or vomiting Anesthetic complications: no   There were no known notable events for this encounter.  Last Vitals:  Vitals:   08/20/23 1615 08/20/23 1630  BP: 118/74 111/72  Pulse: 92 91  Resp: 19 13  Temp:    SpO2: 93% 94%    Last Pain:  Vitals:   08/20/23 1630  TempSrc:   PainSc: 0-No pain                 Dorsey Charette,E. Bhumi Godbey

## 2023-08-20 NOTE — Hospital Course (Addendum)
 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 He underwent EUS on 04/04/2023 by Dr. Nickey Barn and was felt to be at least T3, N1/2, MX. He underwent radiation and chemotherapy in December with last dose being 05/30/2023. Restaging PET CT scan was obtained on 06/30/2023, which showed response with substantial improvement in hypermetabolic activity within the distal esophagus consistent with a positive response to therapy. He followed up with Dr. Deloise Ferries on 07/11/2023 at which time the patient was able to eat without complications and was able to put on some weight. He was offered a Robotic Assisted Ivor Lewis Esophagectomy.  This was performed by Dr. Deloise Ferries on 07/28/2023.  His hospital course was complicated by pain, AKI on CKD, decreased oral intake, and fever.  He developed mild leukocytosis and started on antibiotics, however when no leak was detected these were discontinued.  Repeat esophogram showed no evidence of esophageal leak despite purulent drainage from JP drain.  This was removed per Dr. Raina Bunting instruction prior to patient's discharge on 08/19/2023.  Unfortunately post discharge yesterday the patient developed right sided pain.  Workup in the ED consisted of CT of the chest which did infact reveal evidence of right hydopneumothorax and gas collection with disruption of anastomotic repair, consistence with esophageal leak.  He was admitted for further care.  Hospital Course:  He was initiated on IV fluids for elevated creatinine of 1.9.  He was started on broad spectrum antibiotics Vanc, Maxipime , and flagyl .  He was resumed on tube feedings and made NPO.  He was taken the operating room and underwent Right VATS procedure with placement of chest tube and EGD with stent placement of  esophageal leak.  Postoperative Hospital course:  The patient has shown good overall progression and clinical status.  He was began on broad-spectrum antibiotics including antifungals which will be narrowed at time of culture results.  He has remained afebrile.  Leukocytosis has trended lower.  He continues to have a moderate anemia but this has stabilized.  Does not at the transfusion threshold.  Tube feeds and dysphagia 1 diet were reinitiated and the nutritionist assisted with management.  Chest x-ray appearance as shown continued improvement in resolution of airspace disease.  Chest tube will be kept in place at discharge.  The tube feeding was modified to a more concentrated formula as recommended by the dietitian.  As the rate was titrated up, he began to have leakage of the tube feeding around the J-tube insertion site.  We attempted to manage this by holding the tube feeding for 4 to 5 hours then resuming at a slower rate but the leakage continued so the tube feeding was discontinued.  Dysphagia 1 diet was continued.  Late in the day on postop day 3, he developed a low-grade fever triggering a MEWS score of 4.  He was transferred back to the ICU.  He remained hemodynamically stable and also had an stable respiratory status.  Large volume of cloudy drainage from the chest tube persisted.  Cultures obtained of the pleural fluid obtained intraoperatively grew lactobacillus. The pharmacist recommended Infectious Disease consult for antibiotic guidance since Lactobacillus was not well covered with Levaquin . ID recommended to transition his Levaquin  to Unasyn .  The patient's j tube was re-inflated and he was resumed on tube feedings on 08/25/2023.  He unfortunately  developed abdominal pain/gaseous distention.  He underwent J tube exchange on 08/27/2023 and his tube feeds were resumed.  He continued to have abdominal bloating.  Abdominal x ray was obtained and showed no stool burden and appropriate placement of J  Tube.  Despite this patient was still unable to tolerate tube feedings, leakage persisted around J Tube.  He was started on TPN and underwent repeat J tube exchange with a large tube.  He developed purulent drainage from his thoracotomy site and a Prevena dressing was placed. Abdominal distension and discomfort improved, trickle tube feeds were restarted on 04/12 and tolerated well. 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  Tube feeds were advanced to full strength and ultimately changed to a lower fat content version which she is tolerating.  TPN has been discontinued.  He has continued he is send and had intermittent fevers.  He is not having any leukocytosis. Drainage from the thoracotomy incision under the Praveena did develop increasing purulent type drainage has been recultured.  He did get transfused for hemoglobin of 6.8. He had diarrhea on 04/19 so tube feedings were decreased.  The patients chylothorax persists.  Interventional Radiology was consulted for possible embolization.  They felt intervention would be appropriate.  This was performed on 4/24 with coil and glue embolization of the thoracic duct.  He tolerated the procedure without difficulty.  CT output has slowly decreased initially going towards a serous drainage but has subsequently become somewhat more milky.  The patient developed drainage from his right chest around the chest tube site.  This appeared to smell like tube feedings.  He will require frequent dressing changes.  His chest tube will be connected to a Mini Express Pleurovac.  This will be monitored and removed as an outpatient once appropriate.  The patient's oral intake remains poor.  He is currently on tube feedings at 83ml/hr.  He continues to have 1 episode of diarrhea per day.  He has mild  leukocytosis of 12.6, but remains afebrile.  He is maintaining NSR.  His blood sugars have remained well controlled.  Will discharge patient home today per Dr. Raina Bunting instruction with follow up scheduled for this Friday.

## 2023-08-20 NOTE — Anesthesia Procedure Notes (Signed)
 Procedure Name: Intubation Date/Time: 08/20/2023 1:21 PM  Performed by: Flonnie Hailstone, CRNAPre-anesthesia Checklist: Patient identified, Emergency Drugs available, Suction available and Patient being monitored Patient Re-evaluated:Patient Re-evaluated prior to induction Oxygen Delivery Method: Circle System Utilized Preoxygenation: Pre-oxygenation with 100% oxygen Induction Type: IV induction Ventilation: Mask ventilation without difficulty Laryngoscope Size: Mac and 4 Grade View: Grade I Tube type: Oral Endobronchial tube: Double lumen EBT and 39 Fr Number of attempts: 1 Airway Equipment and Method: Stylet Placement Confirmation: ETT inserted through vocal cords under direct vision, positive ETCO2 and breath sounds checked- equal and bilateral Tube secured with: Tape Dental Injury: Teeth and Oropharynx as per pre-operative assessment

## 2023-08-20 NOTE — Anesthesia Preprocedure Evaluation (Addendum)
 Anesthesia Evaluation  Patient identified by MRN, date of birth, ID band Patient awake    Reviewed: Allergy & Precautions, NPO status , Patient's Chart, lab work & pertinent test results, reviewed documented beta blocker date and time   History of Anesthesia Complications Negative for: history of anesthetic complications  Airway Mallampati: II  TM Distance: >3 FB Neck ROM: Full    Dental  (+) Dental Advisory Given   Pulmonary shortness of breath Esophagectomy anastomotic leak: R effusion CT: 1. 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 Possible site of disruption along the right posterior neo esophagus the level of T5-T6. 2. Small left pleural effusion and associated atelectasis.    breath sounds clear to auscultation       Cardiovascular hypertension, Pt. on medications and Pt. on home beta blockers (-) angina  Rhythm:Regular Rate:Tachycardia  06/2023 Stress:   The study is normal. The study is low risk.   No ST deviation was noted.   LV perfusion is normal. There is no evidence of ischemia. There is no evidence of infarction.   Left ventricular function is normal. Nuclear stress EF: 62%. The left ventricular ejection fraction is normal (55-65%). End diastolic cavity size is normal. End systolic cavity size is normal.    Neuro/Psych negative neurological ROS     GI/Hepatic Neg liver ROS,GERD  Medicated and Controlled,,esophagectomy   Endo/Other  diabetes (glu 214), Insulin Dependent    Renal/GU Renal InsufficiencyRenal disease     Musculoskeletal  (+) Arthritis ,    Abdominal   Peds  Hematology Hb 8.5, plt 497k   Anesthesia Other Findings   Reproductive/Obstetrics                             Anesthesia Physical Anesthesia Plan  ASA: 4  Anesthesia Plan: General   Post-op Pain Management:    Induction:  Intravenous  PONV Risk Score and Plan: 2 and Ondansetron and Dexamethasone  Airway Management Planned: Oral ETT and Double Lumen EBT  Additional Equipment: ClearSight  Intra-op Plan:   Post-operative Plan: Possible Post-op intubation/ventilation  Informed Consent: I have reviewed the patients History and Physical, chart, labs and discussed the procedure including the risks, benefits and alternatives for the proposed anesthesia with the patient or authorized representative who has indicated his/her understanding and acceptance.     Dental advisory given  Plan Discussed with: CRNA and Surgeon  Anesthesia Plan Comments:        Anesthesia Quick Evaluation

## 2023-08-21 ENCOUNTER — Other Ambulatory Visit: Payer: Self-pay

## 2023-08-21 ENCOUNTER — Telehealth: Payer: Self-pay | Admitting: Dietician

## 2023-08-21 ENCOUNTER — Encounter (HOSPITAL_COMMUNITY): Payer: Self-pay | Admitting: Thoracic Surgery (Cardiothoracic Vascular Surgery)

## 2023-08-21 ENCOUNTER — Inpatient Hospital Stay (HOSPITAL_COMMUNITY)

## 2023-08-21 LAB — GLUCOSE, CAPILLARY
Glucose-Capillary: 367 mg/dL — ABNORMAL HIGH (ref 70–99)
Glucose-Capillary: 308 mg/dL — ABNORMAL HIGH (ref 70–99)
Glucose-Capillary: 238 mg/dL — ABNORMAL HIGH (ref 70–99)
Glucose-Capillary: 157 mg/dL — ABNORMAL HIGH (ref 70–99)
Glucose-Capillary: 217 mg/dL — ABNORMAL HIGH (ref 70–99)

## 2023-08-21 LAB — COMPREHENSIVE METABOLIC PANEL WITH GFR
ALT: 22 U/L (ref 0–44)
AST: 18 U/L (ref 15–41)
Albumin: 1.6 g/dL — ABNORMAL LOW (ref 3.5–5.0)
Alkaline Phosphatase: 177 U/L — ABNORMAL HIGH (ref 38–126)
Anion gap: 11 (ref 5–15)
BUN: 46 mg/dL — ABNORMAL HIGH (ref 8–23)
CO2: 16 mmol/L — ABNORMAL LOW (ref 22–32)
Calcium: 7.9 mg/dL — ABNORMAL LOW (ref 8.9–10.3)
Chloride: 107 mmol/L (ref 98–111)
Creatinine, Ser: 1.84 mg/dL — ABNORMAL HIGH (ref 0.61–1.24)
GFR, Estimated: 40 mL/min — ABNORMAL LOW (ref >60)
Glucose, Bld: 391 mg/dL — ABNORMAL HIGH (ref 70–99)
Potassium: 4.3 mmol/L (ref 3.5–5.1)
Sodium: 134 mmol/L — ABNORMAL LOW (ref 135–145)
Total Bilirubin: 0.4 mg/dL (ref 0.0–1.2)
Total Protein: 5.6 g/dL — ABNORMAL LOW (ref 6.5–8.1)

## 2023-08-21 LAB — CBC
HCT: 24.4 % — ABNORMAL LOW (ref 39.0–52.0)
Hemoglobin: 7.9 g/dL — ABNORMAL LOW (ref 13.0–17.0)
MCH: 31 pg (ref 26.0–34.0)
MCHC: 32.4 g/dL (ref 30.0–36.0)
MCV: 95.7 fL (ref 80.0–100.0)
Platelets: 407 10*3/uL — ABNORMAL HIGH (ref 150–400)
RBC: 2.55 MIL/uL — ABNORMAL LOW (ref 4.22–5.81)
RDW: 14.5 % (ref 11.5–15.5)
WBC: 11.5 10*3/uL — ABNORMAL HIGH (ref 4.0–10.5)
nRBC: 0 % (ref 0.0–0.2)

## 2023-08-21 LAB — TYPE AND SCREEN
ABO/RH(D): B NEG
Antibody Screen: NEGATIVE

## 2023-08-21 MED ORDER — SODIUM CHLORIDE 0.9% FLUSH
10.0000 mL | Freq: Two times a day (BID) | INTRAVENOUS | Status: DC
  Administered 2023-08-21 – 2023-08-24 (×6): 10 mL
  Administered 2023-08-24: 20 mL
  Administered 2023-08-25 – 2023-08-26 (×4): 10 mL
  Administered 2023-08-27: 20 mL
  Administered 2023-08-27 – 2023-08-31 (×7): 10 mL
  Administered 2023-09-01: 20 mL
  Administered 2023-09-01 – 2023-09-03 (×4): 10 mL
  Administered 2023-09-03: 20 mL
  Administered 2023-09-04 – 2023-09-07 (×6): 10 mL
  Administered 2023-09-07: 30 mL
  Administered 2023-09-08 – 2023-09-14 (×13): 10 mL

## 2023-08-21 MED ORDER — ACETAMINOPHEN 160 MG/5ML PO SOLN
650.0000 mg | ORAL | Status: DC | PRN
  Administered 2023-08-23 – 2023-09-05 (×7): 650 mg via ORAL
  Filled 2023-08-21 (×8): qty 20.3

## 2023-08-21 MED ORDER — SODIUM CHLORIDE 0.9% FLUSH
10.0000 mL | INTRAVENOUS | Status: DC | PRN
  Administered 2023-08-22: 10 mL

## 2023-08-21 MED ORDER — METOPROLOL TARTRATE 12.5 MG HALF TABLET
12.5000 mg | ORAL_TABLET | Freq: Two times a day (BID) | ORAL | Status: DC
  Administered 2023-08-21 – 2023-08-29 (×17): 12.5 mg via ORAL
  Filled 2023-08-21 (×18): qty 1

## 2023-08-21 MED ORDER — POLYVINYL ALCOHOL 1.4 % OP SOLN
1.0000 [drp] | OPHTHALMIC | Status: DC | PRN
  Filled 2023-08-21: qty 15

## 2023-08-21 MED ORDER — LEVOFLOXACIN IN D5W 750 MG/150ML IV SOLN
750.0000 mg | INTRAVENOUS | Status: DC
  Administered 2023-08-21 – 2023-08-25 (×3): 750 mg via INTRAVENOUS
  Filled 2023-08-21 (×3): qty 150

## 2023-08-21 MED ORDER — LEVOFLOXACIN IN D5W 500 MG/100ML IV SOLN: 500.0000 mg | INTRAVENOUS | Status: DC

## 2023-08-21 MED ORDER — CHLORHEXIDINE GLUCONATE CLOTH 2 % EX PADS
6.0000 | MEDICATED_PAD | Freq: Every day | CUTANEOUS | Status: DC
  Administered 2023-08-21 – 2023-09-14 (×26): 6 via TOPICAL

## 2023-08-21 MED ORDER — FLUCONAZOLE IN SODIUM CHLORIDE 400-0.9 MG/200ML-% IV SOLN
400.0000 mg | INTRAVENOUS | Status: DC
  Administered 2023-08-21 – 2023-08-27 (×7): 400 mg via INTRAVENOUS
  Filled 2023-08-21 (×7): qty 200

## 2023-08-21 MED ORDER — KATE FARMS PEPTIDE 1.5 EN LIQD
720.0000 mL | Freq: Every day | ENTERAL | Status: DC
  Administered 2023-08-21 – 2023-08-22 (×2): 720 mL via JEJUNOSTOMY
  Filled 2023-08-21 (×2): qty 720

## 2023-08-21 NOTE — Telephone Encounter (Signed)
 Left a voicemail with nutrition appointment details.

## 2023-08-21 NOTE — TOC Initial Note (Signed)
 Transition of Care [facility]) - Initial/Assessment Note    Patient Details  Name: Seth Burch MRN: [id number] Date of Birth: 01-28-1956  [facility]) CM/SW Contact:    Harriet Masson, RN Phone Number: 08/21/2023, 12:07 PM  Clinical Narrative:              Patient readmitted esophageal leak. 1 Day Post-Op Procedure(s) (LRB): VIDEO ASSISTED THORACOSCOPY (VATS)/DECORTICATION. Patient had discharged home with [facility] and tube feeds from [facility].  TOC following.    Expected Discharge Plan: Home w Home Health Services Barriers to Discharge: Continued Medical Work up   Patient Goals and CMS Choice            Expected Discharge Plan and Services                                              Prior Living Arrangements/Services                       Activities of Daily Living   ADL Screening (condition at time of admission) Independently performs ADLs?: No Does the patient have a NEW difficulty with bathing/dressing/toileting/self-feeding that is expected to last >3 days?: Yes (Initiates electronic notice to provider for possible OT consult) Does the patient have a NEW difficulty with getting in/out of bed, walking, or climbing stairs that is expected to last >3 days?: Yes (Initiates electronic notice to provider for possible PT consult) Does the patient have a NEW difficulty with communication that is expected to last >3 days?: No Is the patient deaf or have difficulty hearing?: No Does the patient have difficulty seeing, even when wearing glasses/contacts?: No Does the patient have difficulty concentrating, remembering, or making decisions?: No  Permission Sought/Granted                  Emotional Assessment              Admission diagnosis:  SIRS (systemic inflammatory response syndrome) (HCC) [R65.10] Esophagectomy, anastomotic leak [K91.89] Chest pain [R07.9] Patient Active Problem List   Diagnosis Date Noted    Esophagectomy, anastomotic leak 08/20/2023   Chest pain 08/19/2023   Malnutrition of moderate degree 07/30/2023   Esophageal cancer (HCC) 07/28/2023   H/O esophagectomy 07/28/2023   Chemotherapy-induced thrombocytopenia 05/22/2023   Port-A-Cath in place 05/02/2023   Leukopenia due to antineoplastic chemotherapy (HCC) 05/02/2023   CKD (chronic kidney disease) stage 3, GFR 30-59 ml/min (HCC) 04/24/2023   Dysphagia 03/27/2023   Unintentional weight loss 03/27/2023   Primary adenocarcinoma of distal third of esophagus (HCC) 03/11/2023   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   PCP:  Shelle Iron, MD Pharmacy:   [facility] [address], [place] - [address] [place] [address] Phone: [phone] Fax: [phone]  [facility] [address] [place] [address] Phone: [phone] Fax: [phone]     Social Drivers of Health (SDOH) Social History: SDOH Screenings   Food Insecurity: No Food Insecurity (08/21/2023)  Housing: Low Risk  (08/21/2023)  Transportation Needs: No Transportation Needs (08/21/2023)  Utilities: Not At Risk (08/21/2023)  Depression (PHQ2-9): Low Risk  (06/12/2023)  Social Connections: Moderately Integrated (08/21/2023)  Tobacco Use: Low Risk  (08/20/2023)   SDOH Interventions:     Readmission Risk Interventions     No  data to display

## 2023-08-21 NOTE — Evaluation (Signed)
 Physical Therapy Evaluation Patient Details Name: Seth Burch MRN: [id number] DOB: 1956-04-13 Today's Date: 08/21/2023  History of Present Illness  Pt is a 68 y/o male presenting 4/1 due to acute onset of R chest pain after eating. Pt noted with AKI. 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 4/2 R VATS, EGD, esophageal stent placement, and chest tube placement. 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 PMHx: HTN, DM   Clinical Impression  Pt in bed upon arrival and agreeable to PT eval. After recent hospital admission, pt was ModI with a RW for short distances. In today's session, pt required MinA for bed mobility due to pain in R abdomen from the chest tube insertion. Pt was able to stand and ambulate ~200 ft with CGA and RW. Pt required x2 standing rest breaks due to desatting to 82% on 2L. After PLB and ~30 seconds of rest, SpO2 rose to >92%. Pt currently with functional limitations due to the deficits listed below (see PT Problem List). Pt would benefit from acute skilled PT to address functional impairments. Recommending post-acute HHPT to work towards independence with mobility. Acute PT to follow.  96% SpO2 on 2L at rest          If plan is discharge home, recommend the following: Assistance with cooking/housework;Assist for transportation;Help with stairs or ramp for entrance;A little help with walking and/or transfers;A little help with bathing/dressing/bathroom   Can travel by private vehicle    Yes    Equipment Recommendations None recommended by PT     Functional Status Assessment Patient has had a recent decline in their functional status and demonstrates the ability to make significant improvements in function in a reasonable and predictable amount of time.     Precautions /  Restrictions Precautions Precautions: Fall;Other (comment) Precaution/Restrictions Comments: LUE jejunostomy, R chest tube Restrictions Weight Bearing Restrictions Per Provider Order: No      Mobility  Bed Mobility Overal bed mobility: Needs Assistance Bed Mobility: Sit to Supine, Rolling, Sidelying to Sit Rolling: Contact guard assist Sidelying to sit: Min assist, HOB elevated   Sit to supine: Contact guard assist   General bed mobility comments: able to roll with CGA, 1HH assist to elevate trunk. CGA for safety for return to supine    Transfers Overall transfer level: Needs assistance Equipment used: Rolling walker (2 wheels) Transfers: Sit to/from Stand Sit to Stand: Contact guard assist    General transfer comment: CGA for safety    Ambulation/Gait Ambulation/Gait assistance: Contact guard assist Gait Distance (Feet): 200 Feet Assistive device: Rolling walker (2 wheels) Gait Pattern/deviations: Step-through pattern, Decreased stride length Gait velocity: decr     General Gait Details: steady steps with decreased stride length. x2 standing rest breaks as pt desatted to 82% on 2L. Able to return to 92% after ~30 seconds of PLB     Balance Overall balance assessment: Needs assistance Sitting-balance support: No upper extremity supported, Feet supported Sitting balance-Leahy Scale: Good     Standing balance support: Bilateral upper extremity supported, During functional activity Standing balance-Leahy Scale: Fair Standing balance comment: able to stand statically with no AD, RW during gait        Pertinent Vitals/Pain Pain Assessment Pain Assessment: Faces Faces Pain Scale: Hurts little more Pain Location: R chest tube site Pain Descriptors / Indicators: Discomfort, Grimacing Pain Intervention(s): Limited activity within patient's tolerance, Monitored  during session, Premedicated before session, Repositioned    Home Living Family/patient expects to be  discharged to:: Private residence Living Arrangements: Spouse/significant other Available Help at Discharge: Family;Available 24 hours/day Type of Home: House Home Access: Stairs to enter Entrance Stairs-Rails: Right;Left;Can reach both Entrance Stairs-Number of Steps: 4   Home Layout: One level Home Equipment: Pharmacist, hospital (2 wheels);BSC/3in1      Prior Function Prior Level of Function : Independent/Modified Independent;Driving (on disability)      Mobility Comments: Independent without AD prior to initial hospitalization. Using RW for mobility since prior admission ADLs Comments: typically independent in all ADLs, IADLs and driving. Since DC from prior admission, pt reports able to mobilize to/from bathroom without assist. Was not home long enough to attempt other ADLs/IADLs     Extremity/Trunk Assessment   Upper Extremity Assessment Upper Extremity Assessment: Defer to OT evaluation    Lower Extremity Assessment Lower Extremity Assessment: Overall WFL for tasks assessed    Cervical / Trunk Assessment Cervical / Trunk Assessment: Normal  Communication   Communication Communication: No apparent difficulties Factors Affecting Communication: Reduced clarity of speech    Cognition Arousal: Alert Behavior During Therapy: WFL for tasks assessed/performed, Anxious   PT - Cognitive impairments: No apparent impairments  Following commands: Intact       Cueing Cueing Techniques: Verbal cues      PT Assessment Patient needs continued PT services  PT Problem List Decreased activity tolerance;Decreased balance;Decreased mobility;Decreased knowledge of use of DME;Cardiopulmonary status limiting activity       PT Treatment Interventions DME instruction;Gait training;Stair training;Functional mobility training;Therapeutic activities;Therapeutic exercise;Balance training;Neuromuscular re-education;Patient/family education    PT Goals (Current goals can be found  in the Care Plan section)  Acute Rehab PT Goals Patient Stated Goal: to need less assistance with mobility PT Goal Formulation: With patient Time For Goal Achievement: 09/04/23 Potential to Achieve Goals: Good    Frequency Min 2X/week        AM-PAC PT "6 Clicks" Mobility  Outcome Measure Help needed turning from your back to your side while in a flat bed without using bedrails?: A Little Help needed moving from lying on your back to sitting on the side of a flat bed without using bedrails?: A Little Help needed moving to and from a bed to a chair (including a wheelchair)?: A Little Help needed standing up from a chair using your arms (e.g., wheelchair or bedside chair)?: A Little Help needed to walk in hospital room?: A Little Help needed climbing 3-5 steps with a railing? : A Little 6 Click Score: 18    End of Session Equipment Utilized During Treatment: Oxygen Activity Tolerance: Patient tolerated treatment well Patient left: with call bell/phone within reach;in bed;with family/visitor present Nurse Communication: Mobility status PT Visit Diagnosis: Other abnormalities of gait and mobility (R26.89);Unsteadiness on feet (R26.81)    Time: 6045-4098 PT Time Calculation (min) (ACUTE ONLY): 22 min   Charges:   PT Evaluation $PT Eval Low Complexity: 1 Low   PT General Charges $$ ACUTE PT VISIT: 1 Visit       Hilton Cork, PT, DPT Secure Chat Preferred  Rehab Office [phone]   Arturo Morton Brion Aliment 08/21/2023, 4:56 PM

## 2023-08-21 NOTE — Evaluation (Signed)
 Occupational Therapy Evaluation Patient Details Name: Seth Burch MRN: [id number] DOB: 01/20/1956 Today's Date: 08/21/2023   History of Present Illness   Pt is a 68 y/o male presenting 4/1 due to acute onset of R chest pain after eating. Pt noted with AKI. 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 Underwent R VATS, EGD and esophageal stent placement on 4/2. Chest tube placed 4/2. 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 PMHx: HTN, DM     Clinical Impressions PTA, pt lives with spouse and prior to initial hospitalization in March, pt was completely Independent in all daily tasks. Pt only home briefly before readmission but reports able to mobilize to bathroom and manage stairs without issue. Pt presents now with deficits in R sided pain from chest tube, strength and dynamic standing balance. Pt anxious with mobility today but overall moving fairly well. Pt requires Min A for bed mobility, CGA for transfers with RW and no more than Min A for ADLs. Anticipate good progress as pain improves to allow progressive mobility. Recommend HHOT at DC w/ pt and spouse in agreement.   VSS on 2 L O2     If plan is discharge home, recommend the following:   A little help with walking and/or transfers;A little help with bathing/dressing/bathroom;Assistance with cooking/housework;Assist for transportation;Help with stairs or ramp for entrance     Functional Status Assessment   Patient has had a recent decline in their functional status and demonstrates the ability to make significant improvements in function in a reasonable and predictable amount of time.     Equipment Recommendations   None recommended by OT     Recommendations for Other Services         Precautions/Restrictions    Precautions Precautions: Fall;Other (comment) Precaution/Restrictions Comments: LUE jejunostomy, R chest tube Restrictions Weight Bearing Restrictions Per Provider Order: No     Mobility Bed Mobility Overal bed mobility: Needs Assistance Bed Mobility: Supine to Sit, Sit to Supine     Supine to sit: Min assist, HOB elevated Sit to supine: Contact guard assist   General bed mobility comments: Min A to lift trunk with handheld assist to sit EOB. CGA for BLE back to bed.    Transfers Overall transfer level: Needs assistance Equipment used: Rolling walker (2 wheels) Transfers: Sit to/from Stand Sit to Stand: Contact guard assist                  Balance Overall balance assessment: No apparent balance deficits (not formally assessed) Sitting-balance support: No upper extremity supported, Feet supported Sitting balance-Leahy Scale: Good     Standing balance support: Bilateral upper extremity supported, During functional activity Standing balance-Leahy Scale: Fair                             ADL either performed or assessed with clinical judgement   ADL Overall ADL's : Needs assistance/impaired Eating/Feeding: Set up   Grooming: Set up;Sitting   Upper Body Bathing: Set up;Sitting   Lower Body Bathing: Minimal assistance;Sit to/from stand;Sitting/lateral leans   Upper Body Dressing : Set up;Sitting   Lower Body Dressing: Minimal assistance;Sit to/from stand;Sitting/lateral leans   Toilet Transfer: Contact guard assist;Stand-pivot;Rolling walker (2 wheels)   Toileting- Clothing Manipulation and Hygiene: Minimal assistance;Sitting/lateral lean;Sit to/from stand         General  ADL Comments: Pt limited mildly by R chest tube pain. Discussed modifications for LB ADLs to minimize bending which triggers pain with pt able to verbalize understanding. Due to pt pain and anxiety, eval completed seated/standing at bedside. Pt fairly close to functional status  observed prior to DC home.     Vision Ability to See in Adequate Light: 0 Adequate Patient Visual Report: No change from baseline Vision Assessment?: No apparent visual deficits     Perception         Praxis         Pertinent Vitals/Pain Pain Assessment Pain Assessment: Faces Faces Pain Scale: Hurts little more Pain Location: R chest tube site Pain Descriptors / Indicators: Discomfort, Grimacing Pain Intervention(s): Monitored during session, Limited activity within patient's tolerance, Premedicated before session     Extremity/Trunk Assessment Upper Extremity Assessment Upper Extremity Assessment: Overall WFL for tasks assessed;Right hand dominant   Lower Extremity Assessment Lower Extremity Assessment: Defer to PT evaluation   Cervical / Trunk Assessment Cervical / Trunk Assessment: Normal   Communication Communication Communication: No apparent difficulties Factors Affecting Communication: Reduced clarity of speech   Cognition Arousal: Alert Behavior During Therapy: WFL for tasks assessed/performed, Anxious Cognition: No apparent impairments             OT - Cognition Comments: Pt AAOx4 and pleasant throughout session. Was anxious with initial mobility and pain anticipations                 Following commands: Intact       Cueing  General Comments   Cueing Techniques: Verbal cues  Wife at bedside, dieticians entering during session and remained after session   Exercises     Shoulder Instructions      Home Living Family/patient expects to be discharged to:: Private residence Living Arrangements: Spouse/significant other Available Help at Discharge: Family;Available 24 hours/day Type of Home: House Home Access: Stairs to enter [employer] of Steps: 4 Entrance Stairs-Rails: Right;Left;Can reach both Home Layout: One level     Bathroom Shower/Tub: Producer, television/film/video: Standard     Home Equipment: Advice worker (2 wheels);BSC/3in1          Prior Functioning/Environment Prior Level of Function : Independent/Modified Independent;Driving (on disability)             Mobility Comments: Independent without AD prior to initial hospitalization. Using RW for mobility since prior admission ADLs Comments: typically independent in all ADLs, IADLs and driving. Since DC from prior admission, pt reports able to mobilize to/from bathroom without assist. Was not home long enough to attempt other ADLs/IADLs    OT Problem List: Decreased strength;Decreased activity tolerance;Impaired balance (sitting and/or standing);Decreased coordination;Decreased knowledge of use of DME or AE;Cardiopulmonary status limiting activity   OT Treatment/Interventions: Self-care/ADL training;Therapeutic exercise;Energy conservation;DME and/or AE instruction;Therapeutic activities;Patient/family education;Balance training      OT Goals(Current goals can be found in the care plan section)   Acute Rehab OT Goals Patient Stated Goal: be able to get home and stay home OT Goal Formulation: With patient/family Time For Goal Achievement: 09/04/23 Potential to Achieve Goals: Good   OT Frequency:  Min 2X/week    Co-evaluation              AM-PAC OT "6 Clicks" Daily Activity     Outcome Measure Help from another person eating meals?: A Little Help from another person taking care of personal grooming?: A Little Help from another person toileting, which includes  using toliet, bedpan, or urinal?: A Little Help from another person bathing (including washing, rinsing, drying)?: A Little Help from another person to put on and taking off regular upper body clothing?: A Little Help from another person to put on and taking off regular lower body clothing?: A Little 6 Click Score: 18   End of Session Equipment Utilized During Treatment: Rolling walker (2 wheels);Oxygen Nurse Communication: Mobility  status  Activity Tolerance: Patient tolerated treatment well Patient left: in bed;with call bell/phone within reach;with bed alarm set;with family/visitor present;Other (comment) (dieticians at bedside)  OT Visit Diagnosis: Unsteadiness on feet (R26.81);Other abnormalities of gait and mobility (R26.89);Other (comment)                Time: 1610-9604 OT Time Calculation (min): 19 min Charges:  OT General Charges $OT Visit: 1 Visit OT Evaluation $OT Eval Moderate Complexity: 1 Mod  Bradd Canary, OTR/L Acute Rehab Services Office: [phone]   Lorre Munroe 08/21/2023, 1:09 PM

## 2023-08-21 NOTE — Progress Notes (Addendum)
 1 Day Post-Op Procedure(s) (LRB): VIDEO ASSISTED THORACOSCOPY (VATS)/DECORTICATION (Right) EGD (ESOPHAGOGASTRODUODENOSCOPY) (N/A) Subjective: Mod pain with movement, comfortable at rest  Objective: Vital signs in last 24 hours: Temp:  [97.6 F (36.4 C)-98.8 F (37.1 C)] 97.6 F (36.4 C) (04/03 0400) Pulse Rate:  [77-113] 82 (04/03 0400) Cardiac Rhythm: Normal sinus rhythm (04/02 2225) Resp:  [0-31] 18 (04/03 0400) BP: (100-151)/(63-105) 106/71 (04/03 0400) SpO2:  [90 %-96 %] 93 % (04/03 0400) Weight:  [80.6 kg] 80.6 kg (04/02 2234)  Hemodynamic parameters for last 24 hours:    Intake/Output from previous day: 04/02 0701 - 04/03 0700 In: 2000 [I.V.:2000] Out: 1945 [Urine:1725; Blood:50; Chest Tube:170] Intake/Output this shift: No intake/output data recorded.  General appearance: alert, cooperative, and no distress Heart: regular rate and rhythm Lungs: clear to auscultation bilaterally Abdomen: soft, no active BS, non distended Extremities: no edema, PAS in place Wound: incis ok  Lab Results: Recent Labs    08/20/23 0547 08/21/23 0235  WBC 12.3* 11.5*  HGB 8.5* 7.9*  HCT 27.0* 24.4*  PLT 497* 407*   BMET:  Recent Labs    08/20/23 0547 08/21/23 0235  NA 131* 134*  K 4.6 4.3  CL 104 107  CO2 15* 16*  GLUCOSE 239* 391*  BUN 43* 46*  CREATININE 1.77* 1.84*  CALCIUM 8.2* 7.9*    PT/INR:  Recent Labs    08/20/23 0937  LABPROT 15.3*  INR 1.2   ABG    Component Value Date/Time   PHART 7.4 07/29/2023 0514   HCO3 25.8 08/02/2023 2006   TCO2 17 (L) 08/19/2023 2022   ACIDBASEDEF 1.0 08/02/2023 1506   O2SAT 94.5 08/02/2023 2006   CBG (last 3)  Recent Labs    08/20/23 2022 08/20/23 2353 08/21/23 0407  GLUCAP 197* 243* 367*    Meds Scheduled Meds:  enoxaparin (LOVENOX) injection  40 mg Subcutaneous Q24H   feeding supplement (PROSource TF20)  60 mL Per Tube BID   Gerhardt's butt cream  1 Application Topical TID   insulin aspart  0-15 Units  Subcutaneous Q4H   insulin glargine-yfgn  26 Units Subcutaneous Daily   metoprolol tartrate  12.5 mg Per Tube BID   pantoprazole (PROTONIX) IV  40 mg Intravenous Q24H   tamsulosin  0.4 mg Oral Daily   Continuous Infusions:  sodium chloride 100 mL/hr at 08/20/23 2354   ceFEPime (MAXIPIME) IV 2 g (08/21/23 0018)   vancomycin 1,000 mg (08/21/23 0050)   Vivonex RTF 1,000 mL (08/20/23 2345)   PRN Meds:.acetaminophen (TYLENOL) oral liquid 160 mg/5 mL, hydrALAZINE, HYDROmorphone (DILAUDID) injection, ondansetron (ZOFRAN) IV, oxyCODONE  Xrays DG C-Arm 1-60 Min-No Report Result Date: 08/20/2023 Fluoroscopy was utilized by the requesting physician.  No radiographic interpretation.   CT Angio Chest PE W and/or Wo Contrast Result Date: 08/20/2023 CLINICAL DATA:  Sudden onset right-sided chest pain after eating dinner. Discharged earlier today after robotic assisted esophagectomy on 07/28/2023. Postop abdominal pain, right upper quadrant pain. EXAM: CT ANGIOGRAPHY CHEST WITH CONTRAST CT CHEST, ABDOMEN, AND PELVIS WITH CONTRAST TECHNIQUE: Multidetector CT imaging of the chest was performed using the standard protocol during bolus administration of intravenous contrast. Multiplanar CT image reconstructions and MIPs were obtained to evaluate the vascular anatomy. Multidetector CT imaging of the chest, abdomen and pelvis was performed following the standard protocol during bolus administration of intravenous contrast. RADIATION DOSE REDUCTION: This exam was performed according to the departmental dose-optimization program which includes automated exposure control, adjustment of the mA and/or kV according to patient  size and/or use of iterative reconstruction technique. CONTRAST:  75mL OMNIPAQUE IOHEXOL 350 MG/ML SOLN COMPARISON:  Chest radiograph 08/19/2023; barium swallow 08/13/2023; PET/CT 06/30/2023 FINDINGS: CT CHEST FINDINGS Cardiovascular: Normal heart size. No pericardial effusion. Mild aortic atherosclerotic  calcification. No pulmonary embolism. No aortic dissection. Right chest wall Port-A-Cath tip at the superior cavoatrial junction. Mediastinum/Nodes: Postoperative change of esophagectomy. Question disruption of the right posterior neoesophagus at the level of T5-T6 (series 5/image 80). Extraluminal contrast is present within the fluid and gas collection along the posteromedial right upper and lower lobes. Trachea is patent.  No lymphadenopathy. Lungs/Pleura: Loculated right hydropneumothorax. Extraluminal enteric contrast is present within the fluid and gas collections along the posteromedial right upper and lower lobes. The contrast tracks around the posterior right lower lobe is present in the posterolateral thick walled fluid collection extending into the minor fissure. Additional loculated fluid collection along the medial right middle lobe. Loculated fluid and gas collections along the anterolateral right upper lobe. Gas-filled track extending from the right upper lobe inferiorly along the course of the removed chest tube. Small left pleural effusion and associated atelectasis. Musculoskeletal: No acute fracture. Review of the MIP images confirms the above findings. CT ABDOMEN PELVIS FINDINGS Hepatobiliary: Hepatic steatosis.  No acute abnormality. Pancreas: Unremarkable. Spleen: Unremarkable. Adrenals/Urinary Tract: Normal adrenal glands. No urinary calculi or hydronephrosis. Mild diffuse bladder wall thickening. Stomach/Bowel: Circumferential rectal wall thickening. Normal caliber large and small bowel. Normal appendix. Vascular/Lymphatic: Aortic atherosclerotic calcification. No lymphadenopathy. Reproductive: Enlarged prostate and denting the floor of the bladder. Other: No intra-abdominal or pelvic abscess. No free air. Jejunostomy tube with tip in the jejunum in the left upper quadrant. Musculoskeletal: No acute fracture. IMPRESSION: 1. 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 Possible site of disruption along the right posterior neo esophagus the level of T5-T6. 2. Small left pleural effusion and associated atelectasis. 3. Circumferential rectal wall thickening, suspicious for proctitis. Neoplasm is difficult to exclude. Recommend correlation with colonoscopy when clinically appropriate. 4. Mild diffuse bladder wall thickening, possibly due to chronic bladder outlet obstruction given enlarged prostate. 5. Hepatic steatosis. 6. Aortic Atherosclerosis (ICD10-I70.0). These results were called by telephone at the time of interpretation on 08/20/2023 at 12:32 am to provider Dr. Dahlia Client, who verbally acknowledged these results. Electronically Signed   By: Minerva Fester M.D.   On: 08/20/2023 00:35   CT CHEST ABDOMEN PELVIS W CONTRAST Result Date: 08/20/2023 CLINICAL DATA:  Sudden onset right-sided chest pain after eating dinner. Discharged earlier today after robotic assisted esophagectomy on 07/28/2023. Postop abdominal pain, right upper quadrant pain. EXAM: CT ANGIOGRAPHY CHEST WITH CONTRAST CT CHEST, ABDOMEN, AND PELVIS WITH CONTRAST TECHNIQUE: Multidetector CT imaging of the chest was performed using the standard protocol during bolus administration of intravenous contrast. Multiplanar CT image reconstructions and MIPs were obtained to evaluate the vascular anatomy. Multidetector CT imaging of the chest, abdomen and pelvis was performed following the standard protocol during bolus administration of intravenous contrast. RADIATION DOSE REDUCTION: This exam was performed according to the departmental dose-optimization program which includes automated exposure control, adjustment of the mA and/or kV according to patient size and/or use of iterative reconstruction technique. CONTRAST:  75mL OMNIPAQUE IOHEXOL 350 MG/ML SOLN COMPARISON:  Chest radiograph 08/19/2023; barium swallow 08/13/2023; PET/CT 06/30/2023 FINDINGS: CT CHEST FINDINGS  Cardiovascular: Normal heart size. No pericardial effusion. Mild aortic atherosclerotic calcification. No pulmonary embolism. No aortic dissection. Right chest wall Port-A-Cath tip at the superior cavoatrial junction. Mediastinum/Nodes: Postoperative change  of esophagectomy. Question disruption of the right posterior neoesophagus at the level of T5-T6 (series 5/image 80). Extraluminal contrast is present within the fluid and gas collection along the posteromedial right upper and lower lobes. Trachea is patent.  No lymphadenopathy. Lungs/Pleura: Loculated right hydropneumothorax. Extraluminal enteric contrast is present within the fluid and gas collections along the posteromedial right upper and lower lobes. The contrast tracks around the posterior right lower lobe is present in the posterolateral thick walled fluid collection extending into the minor fissure. Additional loculated fluid collection along the medial right middle lobe. Loculated fluid and gas collections along the anterolateral right upper lobe. Gas-filled track extending from the right upper lobe inferiorly along the course of the removed chest tube. Small left pleural effusion and associated atelectasis. Musculoskeletal: No acute fracture. Review of the MIP images confirms the above findings. CT ABDOMEN PELVIS FINDINGS Hepatobiliary: Hepatic steatosis.  No acute abnormality. Pancreas: Unremarkable. Spleen: Unremarkable. Adrenals/Urinary Tract: Normal adrenal glands. No urinary calculi or hydronephrosis. Mild diffuse bladder wall thickening. Stomach/Bowel: Circumferential rectal wall thickening. Normal caliber large and small bowel. Normal appendix. Vascular/Lymphatic: Aortic atherosclerotic calcification. No lymphadenopathy. Reproductive: Enlarged prostate and denting the floor of the bladder. Other: No intra-abdominal or pelvic abscess. No free air. Jejunostomy tube with tip in the jejunum in the left upper quadrant. Musculoskeletal: No acute  fracture. IMPRESSION: 1. 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 Possible site of disruption along the right posterior neo esophagus the level of T5-T6. 2. Small left pleural effusion and associated atelectasis. 3. Circumferential rectal wall thickening, suspicious for proctitis. Neoplasm is difficult to exclude. Recommend correlation with colonoscopy when clinically appropriate. 4. Mild diffuse bladder wall thickening, possibly due to chronic bladder outlet obstruction given enlarged prostate. 5. Hepatic steatosis. 6. Aortic Atherosclerosis (ICD10-I70.0). These results were called by telephone at the time of interpretation on 08/20/2023 at 12:32 am to provider Dr. Dahlia Client, who verbally acknowledged these results. Electronically Signed   By: Minerva Fester M.D.   On: 08/20/2023 00:35   DG Chest 2 View Result Date: 08/19/2023 CLINICAL DATA:  cp EXAM: CHEST - 2 VIEW COMPARISON:  Chest x-ray 08/13/2023, CT chest 03/14/2023 mama esophagram 08/13/2023 FINDINGS: Right chest wall Port-A-Cath with tip at the superior caval junction. The heart and mediastinal contours are within normal limits. Right lower lung zone airspace opacity. No pulmonary edema. At least small right pleural effusion. Trace left pleural effusion. No pneumothorax. No acute osseous abnormality. Right paramediastinal density suggestive of known thickened esophageal wall. Interval decrease in associated calcifications versus retained PO contrast. IMPRESSION: 1. At least small right pleural effusion. Underlying atelectasis versus infection. 2. Trace left pleural effusion. 3. Right paramediastinal density suggestive of known thickened esophageal wall. Interval decrease in associated calcifications versus retained PO contrast. Electronically Signed   By: Tish Frederickson M.D.   On: 08/19/2023 20:57    Assessment/Plan: S/P Procedure(s) (LRB): VIDEO ASSISTED THORACOSCOPY  (VATS)/DECORTICATION (Right) EGD (ESOPHAGOGASTRODUODENOSCOPY) (N/A) POD#1 VATs/esophageal stent  1 afeb, HR 70's-110's, s BP 100's- 150's 2 O2 sats good on 2 liters 3 CT 170, slightly seropurulent- leave in place 4 CXR good improvement right effus/air space dz 5 BS elevated 170- 367 range- on glargine and SSI- follow closely , surgical stress playing impact-  on TF's 6 BUN/Creat increased to 46/1.84- similar numbers to prior to discharge overall 7 protein -cal malnutrition- push nutrition as feasible 8 leukocytosis trend improved- on cefepime/vanco 9 expected ABLA decreased some, monitor closely as is approaching transfusion  threshold 10 will likely start D1 diet soon, will wait till he has some active BS/flatus to avoid any type of vomiting 11 on dilaudid for main- monitor control 12 rehab and pulm hygiene     LOS: 2 days    Rowe Clack PA-C Pager [phone] 08/21/2023   Agree with above Continue CT management Can start dys 1 diet Continue abx and antifungals  Cheryl Chay O Ndrew Creason

## 2023-08-21 NOTE — Progress Notes (Addendum)
 PHARMACY - PHYSICIAN COMMUNICATION CRITICAL VALUE ALERT - BLOOD CULTURE IDENTIFICATION (BCID)  Seth Burch is an 68 y.o. male who presented to [facility] on [facility]/19/2023 with concern for esophageal leak.  Assessment:  27 YOM with esophageal cancer and recent robotic assisted esophagectomy 3/10 who presented 4/1 with sudden R-sided CP and concern for esophageal leak now s/p VATS/esophageal stent 4/2. Pleural fluid showing GPR/GPC on gram stain and now with GPR in 1 of 3 blood cultures (aerobic bottle) - hard to tell if this is pathogenic or contamination since only in 1 bottle.   Name of physician (or Provider) Contacted: Gershon Crane + Dr. Cliffton Asters  Current antibiotics: Levaquin + Fluconazole  Changes to prescribed antibiotics recommended:  Pt clinically stable, upon discussing with CVTS team plan is to hold the course pending GPR speciation  Blood Culture (routine x 2) [[id number]] Collected: 08/19/23 2209  Order Status: Completed Specimen: BLOOD RIGHT HAND Updated: 08/21/23 1530   Specimen Description BLOOD RIGHT HAND   Special Requests AEROBIC BOTTLE ONLY Blood Culture results may not be optimal due to an inadequate volume of blood received in culture bottles   Culture  Setup Time --   GRAM POSITIVE RODS AEROBIC BOTTLE ONLY CRITICAL RESULT CALLED TO, READ BACK BY AND VERIFIED WITH: PHARMD Briant Angelillo ON 08/21/23 @ 1530 BY DRT Performed at [facility], [address], [place], [place] [address]   Culture GRAM POSITIVE RODS   Report Status PENDING  Blood Culture (routine x 2) [[id number]] Collected: 08/19/23 2020  Order Status: Completed Specimen: BLOOD RIGHT HAND Updated: 08/21/23 0906   Specimen Description BLOOD RIGHT HAND   Special Requests BOTTLES DRAWN AEROBIC AND ANAEROBIC Blood Culture results may not be optimal due to an inadequate volume of blood received in culture bottles   Culture --   NO GROWTH 2 DAYS Performed at [facility], [address],  [place], [place] [address]    Thank you for allowing pharmacy to be a part of this patient's care.  Georgina Pillion, PharmD, BCPS, BCIDP Infectious Diseases Clinical Pharmacist 08/21/2023 4:00 PM   **Pharmacist phone directory can now be found on [email or web address] ([email or web address]).  Listed under [facility].

## 2023-08-21 NOTE — Progress Notes (Signed)
 Initial Nutrition Assessment  DOCUMENTATION CODES:   Severe malnutrition in context of chronic illness  INTERVENTION:   -Initiate tube feeding via J-tube: Seth Burch 1.5 at 45 ml/h for 16 hours (6AM-10PM) - 720 ml per day Prosource TF20 60 ml BID Provides 1267 kcals, 93g protein, free water  - If tolerates reduced rate today, recommend increase to goal rate tomorrow (4/4)  - Goal Regimen (16 hour): Seth Burch 1.5 at 90 ml/hr (provides 2160 kcals, 107 g of protein and 1008 mL of free water) = 4.5 cartons per day.   - Continue Prosource BID until goal rate achieved - Monitor diet advancement and tolerance - Continue to encourage addition of patient purchased unflavored protein powder to low protein food items   NUTRITION DIAGNOSIS:  Severe Malnutrition related to chronic illness (esophageal cancer s/p chemo, radiation and surgery) as evidenced by severe fat depletion, severe muscle depletion, percent weight loss.  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 Returned hours after discharge with c/o chest pain. PMH: DM, HTN, dyslipedemia, CKD, IgG kappa MGUS.  Previous Admission 3/10 EGD, XI Robotic Assisted Ivor Lewis Esophagectomy, J-tube placement 3/11 Trickle TF initiated 3/12 Surgery increased TF to 40 ml/hr 3/13 Surgery increased TF to 55 ml/hr, transferred to ICU  3/14 Surgery increased TF to 65 ml/hr (goal), bowel regimen added and +BM 3/15 Intubated, Bronch (Thick mucus in trachea suctioned, partially occluding L mainstem, ?aspiration, recurrent false vocal cord irritation with swelling leading to stridor and resp failure) 3/17  Extubated 3/18 Esophogram:  Brisk tracheal aspiration of ingested contrast, into the LEFT mainstem bronchus. SLP eval recommended. Stagnant contrast at diaphragmatic hiatus. No anastomotic leak.  3/19 Diet advanced to Dysphagia 2/Thins, later downgraded to Nectar Thick Liquids 3/20 Transition to Nocturnal TF, family to bring protein shake in from home 3/21 Significant diarrhea overnight, (9 stools in 10 hours), significant J-tube drainage, change to Vivonex with day time feedings 3/24 Diet changed to Dysphagia 2, Thins per SLP 3/26 NPO due to concern for possible esophageal leak 3/27 advanced to Dysphagia 3, thin liquids  3/31 modify TF regimen for d/c 4/1 discharged home Current Admission 4/2 admitted; OR: R VATS, EGD, and esophageal stent placement 4/3 trial new TF formula during the day  Met with patient and wife at bedside today s/p readmission. He voices no pain outside of surgical site where intervention occurred yesterday. Wife reports patient discharged home and napped in recliner for approximately 4 hours. Awakened for dinner of chicken pot pie where he consumed 4-5 bites. Significant chest pain s/p meal. Decision was made to return to hospital. Patient endorsed no difficulty chewing or swallowing his meal s/p discharge. Did endorse that MD reported food and pills sitting in esophagus during surgery.  Dysphagia 1 diet initiated today. Pt and wife endorse that he is unlikely to prefer this texture as he is picky eater at baseline. Discussed a transition to different tube feed formula to better meet needs. Pt previously not open to trial a different formula d/t fear of intolerance. He and wife are amicable to this today as they do not prefer for him to be receiving tube feeding 24 hours a day. Reiterated that,  even at goal rate for 24 hours a day, current TF regimen only meeting 75% of estimated calorie and protein needs.   Repeat nutrition-focused physical exam completed today. Patient now  meets criteria for severe protein-calorie malnutrition. He has experienced a decline in nutrition status during last admission. Upon inspection of his J-tube site, dressing appears to have come loose 2/2 leaking of bilious appearing liquid. Maceration of skin also noticed around site. Made nursing aware of the need for dressing change.   Admit Weight: 77.1kg Current Weight: 80.6kg  Pt with some wt gain and mild swelling r/t fluids received during surgery. He has shown significant weight loss since cancer dx. Reporting 40-50lbs wt loss in November/December of 2024. Previous UBW around 109kg in January 2024. This is 27% wt loss in one year and considered clinically significant for the time frame.    Intake/Output Summary (Last 24 hours) at 08/21/2023 1533 Last data filed at 08/21/2023 0736 Gross per 24 hour  Intake 549.5 ml  Output 1445 ml  Net -895.5 ml    Net IO Since Admission: 2,654.5 mL [08/21/23 1533]    Drains/Lines: LUQ: Jejunostomy (16 Fr) R Lateral Pleural (19 Fr): x24 hours UOP: x24 hours  Meds: SSI 0-15 Q4 hours, Semglee 26 QD, pantoprazole,  Drips:  IV ABX Fluconazole  Labs: Na+ 132>131>134 (L) K+ 4.3 (wdl) CBGs 239-391 x24 hours A1c 7.1 (07/2023)   NUTRITION - FOCUSED PHYSICAL EXAM:  Flowsheet Row Most Recent Value  Orbital Region Severe depletion  Upper Arm Region Severe depletion  Thoracic and Lumbar Region Moderate depletion  Buccal Region Severe depletion  Temple Region Moderate depletion  Clavicle Bone Region Severe depletion  Clavicle and Acromion Bone Region Moderate depletion  Scapular Bone Region Moderate depletion  Dorsal Hand Mild depletion  Patellar Region Severe depletion  Anterior Thigh Region Severe depletion  Posterior Calf Region Severe depletion  Edema (RD Assessment) Mild  Hair Reviewed  Eyes Reviewed  Mouth Reviewed  Skin Reviewed  Nails Reviewed   Diet Order:   Diet Order             DIET - DYS 1 Room service  appropriate? Yes; Fluid consistency: Thin  Diet effective now             EDUCATION NEEDS:   Education needs have been addressed  Skin:     Last BM:  4/1 - type 7 x1  Height:  Ht Readings from Last 1 Encounters:  08/20/23 5\' 10"  (1.778 m)   Weight:  Wt Readings from Last 1 Encounters:  08/20/23 80.6 kg   Ideal Body Weight:     BMI:  Body mass index is 25.5 kg/m.  Estimated Nutritional Needs:   Kcal:  2200-2400kcal  Protein:  115-130g  Fluid:  >=2L/day  Myrtie Cruise MS, RD, LDN Registered Dietitian Clinical Nutrition RD Inpatient Contact Info in Amion

## 2023-08-22 ENCOUNTER — Other Ambulatory Visit: Payer: Self-pay

## 2023-08-22 ENCOUNTER — Ambulatory Visit: Admitting: Thoracic Surgery (Cardiothoracic Vascular Surgery)

## 2023-08-22 ENCOUNTER — Inpatient Hospital Stay (HOSPITAL_COMMUNITY)

## 2023-08-22 DIAGNOSIS — E43 Unspecified severe protein-calorie malnutrition: Secondary | ICD-10-CM | POA: Insufficient documentation

## 2023-08-22 LAB — GLUCOSE, CAPILLARY
Glucose-Capillary: 131 mg/dL — ABNORMAL HIGH (ref 70–99)
Glucose-Capillary: 203 mg/dL — ABNORMAL HIGH (ref 70–99)
Glucose-Capillary: 220 mg/dL — ABNORMAL HIGH (ref 70–99)
Glucose-Capillary: 262 mg/dL — ABNORMAL HIGH (ref 70–99)
Glucose-Capillary: 279 mg/dL — ABNORMAL HIGH (ref 70–99)
Glucose-Capillary: 305 mg/dL — ABNORMAL HIGH (ref 70–99)
Glucose-Capillary: 92 mg/dL (ref 70–99)

## 2023-08-22 LAB — COMPREHENSIVE METABOLIC PANEL WITH GFR
ALT: 21 U/L (ref 0–44)
AST: 22 U/L (ref 15–41)
Albumin: 1.6 g/dL — ABNORMAL LOW (ref 3.5–5.0)
Alkaline Phosphatase: 196 U/L — ABNORMAL HIGH (ref 38–126)
Anion gap: 8 (ref 5–15)
BUN: 52 mg/dL — ABNORMAL HIGH (ref 8–23)
CO2: 20 mmol/L — ABNORMAL LOW (ref 22–32)
Calcium: 8.1 mg/dL — ABNORMAL LOW (ref 8.9–10.3)
Chloride: 110 mmol/L (ref 98–111)
Creatinine, Ser: 1.62 mg/dL — ABNORMAL HIGH (ref 0.61–1.24)
GFR, Estimated: 47 mL/min — ABNORMAL LOW (ref >60)
Glucose, Bld: 304 mg/dL — ABNORMAL HIGH (ref 70–99)
Potassium: 4.3 mmol/L (ref 3.5–5.1)
Sodium: 138 mmol/L (ref 135–145)
Total Bilirubin: 0.2 mg/dL (ref 0.0–1.2)
Total Protein: 5.7 g/dL — ABNORMAL LOW (ref 6.5–8.1)

## 2023-08-22 LAB — CBC
HCT: 24.3 % — ABNORMAL LOW (ref 39.0–52.0)
Hemoglobin: 7.6 g/dL — ABNORMAL LOW (ref 13.0–17.0)
MCH: 30 pg (ref 26.0–34.0)
MCHC: 31.3 g/dL (ref 30.0–36.0)
MCV: 96 fL (ref 80.0–100.0)
Platelets: 488 10*3/uL — ABNORMAL HIGH (ref 150–400)
RBC: 2.53 MIL/uL — ABNORMAL LOW (ref 4.22–5.81)
RDW: 14.9 % (ref 11.5–15.5)
WBC: 10.8 10*3/uL — ABNORMAL HIGH (ref 4.0–10.5)
nRBC: 0 % (ref 0.0–0.2)

## 2023-08-22 MED ORDER — KATE FARMS PEPTIDE 1.5 EN LIQD: 1440.0000 mL | Freq: Every day | ENTERAL | Status: DC

## 2023-08-22 MED ORDER — KATE FARMS PEPTIDE 1.5 EN LIQD
1440.0000 mL | Freq: Every day | ENTERAL | Status: DC
  Administered 2023-08-22: 1440 mL via JEJUNOSTOMY
  Filled 2023-08-22 (×2): qty 1440

## 2023-08-22 MED ORDER — INSULIN GLARGINE-YFGN 100 UNIT/ML ~~LOC~~ SOLN
32.0000 [IU] | Freq: Every day | SUBCUTANEOUS | Status: DC
  Administered 2023-08-22 – 2023-08-23 (×2): 32 [IU] via SUBCUTANEOUS
  Filled 2023-08-22 (×3): qty 0.32

## 2023-08-22 MED ORDER — TEMAZEPAM 15 MG PO CAPS
15.0000 mg | ORAL_CAPSULE | Freq: Once | ORAL | Status: AC
  Administered 2023-08-22: 15 mg via ORAL
  Filled 2023-08-22: qty 1

## 2023-08-22 MED ORDER — KATE FARMS PEPTIDE 1.5 EN LIQD
1040.0000 mL | Freq: Every day | ENTERAL | Status: DC
  Administered 2023-08-23 (×3): 1040 mL via JEJUNOSTOMY
  Administered 2023-08-25: 1000 mL via JEJUNOSTOMY
  Administered 2023-08-28: 1040 mL via JEJUNOSTOMY
  Filled 2023-08-22 (×8): qty 1040

## 2023-08-22 NOTE — Plan of Care (Signed)
  Problem: Coping: Goal: Ability to adjust to condition or change in health will improve Outcome: Progressing   Problem: Nutritional: Goal: Maintenance of adequate nutrition will improve Outcome: Progressing   Problem: Skin Integrity: Goal: Risk for impaired skin integrity will decrease Outcome: Progressing   Problem: Tissue Perfusion: Goal: Adequacy of tissue perfusion will improve Outcome: Progressing   Problem: Clinical Measurements: Goal: Diagnostic test results will improve Outcome: Progressing Goal: Respiratory complications will improve Outcome: Progressing Goal: Cardiovascular complication will be avoided Outcome: Progressing   Problem: Activity: Goal: Risk for activity intolerance will decrease Outcome: Progressing   Problem: Nutrition: Goal: Adequate nutrition will be maintained Outcome: Progressing   Problem: Coping: Goal: Level of anxiety will decrease Outcome: Progressing   Problem: Elimination: Goal: Will not experience complications related to bowel motility Outcome: Progressing Goal: Will not experience complications related to urinary retention Outcome: Progressing   Problem: Pain Managment: Goal: General experience of comfort will improve and/or be controlled Outcome: Progressing   Problem: Safety: Goal: Ability to remain free from injury will improve Outcome: Progressing   Problem: Skin Integrity: Goal: Risk for impaired skin integrity will decrease Outcome: Progressing

## 2023-08-22 NOTE — Progress Notes (Addendum)
 2 Days Post-Op Procedure(s) (LRB): VIDEO ASSISTED THORACOSCOPY (VATS)/DECORTICATION (Right) EGD (ESOPHAGOGASTRODUODENOSCOPY) (N/A) Subjective: Some pain   Objective: Vital signs in last 24 hours: Temp:  [97.3 F (36.3 C)-98.8 F (37.1 C)] 97.9 F (36.6 C) (04/04 0338) Pulse Rate:  [81-92] 85 (04/04 0624) Cardiac Rhythm: Normal sinus rhythm (04/03 1906) Resp:  [16-20] 20 (04/04 0624) BP: (115-127)/(67-77) 127/76 (04/04 0338) SpO2:  [96 %-98 %] 97 % (04/04 0624)  Hemodynamic parameters for last 24 hours:    Intake/Output from previous day: 04/03 0701 - 04/04 0700 In: 1088.7 [P.O.:120; I.V.:15; NG/GT:550.7; IV Piggyback:343] Out: 475 [Urine:125; Chest Tube:350] Intake/Output this shift: No intake/output data recorded.  General appearance: alert, cooperative, and no distress Heart: regular rate and rhythm Lungs: mildly dim left base Abdomen: benign Extremities: no edema Wound: incis healing well  Lab Results: Recent Labs    08/21/23 0235 08/22/23 0508  WBC 11.5* 10.8*  HGB 7.9* 7.6*  HCT 24.4* 24.3*  PLT 407* 488*   BMET:  Recent Labs    08/21/23 0235 08/22/23 0508  NA 134* 138  K 4.3 4.3  CL 107 110  CO2 16* 20*  GLUCOSE 391* 304*  BUN 46* 52*  CREATININE 1.84* 1.62*  CALCIUM 7.9* 8.1*    PT/INR:  Recent Labs    08/20/23 0937  LABPROT 15.3*  INR 1.2   ABG    Component Value Date/Time   PHART 7.4 07/29/2023 0514   HCO3 25.8 08/02/2023 2006   TCO2 17 (L) 08/19/2023 2022   ACIDBASEDEF 1.0 08/02/2023 1506   O2SAT 94.5 08/02/2023 2006   CBG (last 3)  Recent Labs    08/21/23 2001 08/22/23 0002 08/22/23 0336  GLUCAP 217* 305* 262*    Meds Scheduled Meds:  Chlorhexidine Gluconate Cloth  6 each Topical Daily   enoxaparin (LOVENOX) injection  40 mg Subcutaneous Q24H   feeding supplement (PROSource TF20)  60 mL Per Tube BID   Gerhardt's butt cream  1 Application Topical TID   insulin aspart  0-15 Units Subcutaneous Q4H   insulin  glargine-yfgn  26 Units Subcutaneous Daily   [employer] Peptide 1.5  720 mL Per J Tube Daily   metoprolol tartrate  12.5 mg Oral BID   pantoprazole (PROTONIX) IV  40 mg Intravenous Q24H   sodium chloride flush  10-40 mL Intracatheter Q12H   tamsulosin  0.4 mg Oral Daily   Continuous Infusions:  fluconazole (DIFLUCAN) IV Stopped (08/21/23 1259)   levofloxacin (LEVAQUIN) IV Stopped (08/21/23 1238)   PRN Meds:.acetaminophen (TYLENOL) oral liquid 160 mg/5 mL, hydrALAZINE, HYDROmorphone (DILAUDID) injection, ondansetron (ZOFRAN) IV, oxyCODONE, polyvinyl alcohol, sodium chloride flush  Xrays DG CHEST PORT 1 VIEW Result Date: 08/21/2023 CLINICAL DATA:  Pleural effusion. EXAM: PORTABLE CHEST 1 VIEW COMPARISON:  Chest radiograph dated 08/19/2023. FINDINGS: Right-sided Port-A-Cath in similar position. Interval placement of a right chest tube as well as esophageal stent. Small bilateral pleural effusions and bibasilar atelectasis or infiltrate, right greater than left. No pneumothorax. Stable cardiac silhouette no acute osseous pathology. IMPRESSION: 1. Interval placement of a right chest tube. No pneumothorax. 2. Small bilateral pleural effusions and bibasilar atelectasis or infiltrate, right greater than left. Electronically Signed   By: Elgie Collard M.D.   On: 08/21/2023 10:54   DG C-Arm 1-60 Min-No Report Result Date: 08/20/2023 Fluoroscopy was utilized by the requesting physician.  No radiographic interpretation.   Recent Results (from the past 240 hours)  Blood Culture (routine x 2)     Status: None (Preliminary result)  Collection Time: 08/19/23  8:20 PM   Specimen: BLOOD RIGHT HAND  Result Value Ref Range Status   Specimen Description BLOOD RIGHT HAND  Final   Special Requests   Final    BOTTLES DRAWN AEROBIC AND ANAEROBIC Blood Culture results may not be optimal due to an inadequate volume of blood received in culture bottles   Culture   Final    NO GROWTH 2 DAYS Performed at [facility], [address], [place], [place] [address]    Report Status PENDING  Incomplete  Blood Culture (routine x 2)     Status: None (Preliminary result)   Collection Time: 08/19/23 10:09 PM   Specimen: BLOOD RIGHT HAND  Result Value Ref Range Status   Specimen Description BLOOD RIGHT HAND  Final   Special Requests   Final    AEROBIC BOTTLE ONLY Blood Culture results may not be optimal due to an inadequate volume of blood received in culture bottles   Culture  Setup Time   Final    GRAM POSITIVE RODS AEROBIC BOTTLE ONLY CRITICAL RESULT CALLED TO, READ BACK BY AND VERIFIED WITH: PHARMD ELIZABETH MARTIN ON 08/21/23 @ 1530 BY DRT Performed at [facility], [address] [place], [place] [address]    Culture GRAM POSITIVE RODS  Final   Report Status PENDING  Incomplete  Resp panel by RT-PCR (RSV, Flu A&B, Covid) Anterior Nasal Swab     Status: None   Collection Time: 08/19/23 10:36 PM   Specimen: Anterior Nasal Swab  Result Value Ref Range Status   SARS Coronavirus 2 by RT PCR NEGATIVE NEGATIVE Final   Influenza A by PCR NEGATIVE NEGATIVE Final   Influenza B by PCR NEGATIVE NEGATIVE Final    Comment: (NOTE) The Xpert Xpress SARS-CoV-2/FLU/RSV plus assay is intended as an aid in the diagnosis of influenza from Nasopharyngeal swab specimens and should not be used as a sole basis for treatment. Nasal washings and aspirates are unacceptable for Xpert Xpress SARS-CoV-2/FLU/RSV testing.  Fact Sheet for Patients: BloggerCourse.com  Fact Sheet for Healthcare Providers: SeriousBroker.it  This test is not yet approved or cleared by the [place] FDA and has been authorized for detection and/or diagnosis of SARS-CoV-2 by FDA under an Emergency Use Authorization (EUA). This EUA will remain in effect (meaning this test can be used) for the duration of the COVID-19 declaration under Section 564(b)(1) of the Act, 21  U.S.C. section 360bbb-3(b)(1), unless the authorization is terminated or revoked.     Resp Syncytial Virus by PCR NEGATIVE NEGATIVE Final    Comment: (NOTE) Fact Sheet for Patients: BloggerCourse.com  Fact Sheet for Healthcare Providers: SeriousBroker.it  This test is not yet approved or cleared by the [place] FDA and has been authorized for detection and/or diagnosis of SARS-CoV-2 by FDA under an Emergency Use Authorization (EUA). This EUA will remain in effect (meaning this test can be used) for the duration of the COVID-19 declaration under Section 564(b)(1) of the Act, 21 U.S.C. section 360bbb-3(b)(1), unless the authorization is terminated or revoked.  Performed at [facility], [address] [place], [place] [address]   Aerobic/Anaerobic Culture w Gram Stain (surgical/deep wound)     Status: None (Preliminary result)   Collection Time: 08/20/23  2:12 PM   Specimen: Path fluid; Body Fluid  Result Value Ref Range Status   Specimen Description PLEURAL FLUID  Final   Special Requests SWAB PT ON FLAGYL,CEFEPIME,VANC  Final   Gram Stain  Final    RARE WBC PRESENT, PREDOMINANTLY PMN FEW GRAM POSITIVE RODS RARE GRAM POSITIVE COCCI    Culture   Final    CULTURE REINCUBATED FOR BETTER GROWTH Performed at [facility], [address], [place], [place] [address]    Report Status PENDING  Incomplete    Assessment/Plan: S/P Procedure(s) (LRB): VIDEO ASSISTED THORACOSCOPY (VATS)/DECORTICATION (Right) EGD (ESOPHAGOGASTRODUODENOSCOPY) (N/A)  1 afeb, VSS, sinus rhythm 2 O2 sats good on 2 liters 3 UOP- not all recorded 4 getting TF-s, changed to [place] for increased nutrition, started D1 diet 5 CT- 350 ml/24h- drainage may be consistant w. Tube feeding(brownish) vs infectious - d/w Dr Cliffton Asters , cont tube feeds and D1 diet and cont to observe 6 CXR right basilar atx, improving ASD 7 BS variable and  elevated - will increase glargine, cont SSI 8 renal fxn improving trend on creat BUN rising- may be a bit dehydrated 9 leukocytosis trend improving - on levoquin and diflucan, cultures re- incubated for better growth 10 H/H stable , near transfusion threshold 11 cont rehab modalities 12 lovenox for DVT ppx     LOS: 3 days    Rowe Clack PA-C Pager [phone] 08/22/2023  Pleural fluid consistent with surgical appearance.  Ok to continue tubefeeds Awaiting final cultures Will plan to send home with abx.    Seth Burch

## 2023-08-22 NOTE — Plan of Care (Addendum)
 Discussed earlier in the shift plan of care for the evening, pain management and medications with some teach back displayed.  We talked about his chest tube and ambulation with it.  Remove left hand IV wasn't infiltrated yet.  Hard to flush with no blood return and patient complained of pain when flushing.  Problem: Education: Goal: Ability to describe self-care measures that may prevent or decrease complications (Diabetes Survival Skills Education) will improve Outcome: Progressing   Problem: Health Behavior/Discharge Planning: Goal: Ability to identify and utilize available resources and services will improve Outcome: Progressing   Problem: Activity: Goal: Risk for activity intolerance will decrease Outcome: Progressing   Problem: Pain Managment: Goal: General experience of comfort will improve and/or be controlled Outcome: Progressing

## 2023-08-22 NOTE — Progress Notes (Signed)
 Mobility Specialist Progress Note:    08/22/23 1200  Therapy Vitals  Temp 97.9 F (36.6 C)  Temp Source Oral  Pulse Rate 84  Resp 19  BP 128/78  Patient Position (if appropriate) Lying  Oxygen Therapy  SpO2 96 %  O2 Device Nasal Cannula  O2 Flow Rate (L/min) 3 L/min  Mobility  Activity Ambulated with assistance in hallway  Level of Assistance Contact guard assist, steadying assist  Assistive Device Front wheel walker  Distance Ambulated (ft) 200 ft  Activity Response Tolerated well  Mobility Referral Yes  Mobility visit 1 Mobility  Mobility Specialist Start Time (ACUTE ONLY) 1108  Mobility Specialist Stop Time (ACUTE ONLY) 1123  Mobility Specialist Time Calculation (min) (ACUTE ONLY) 15 min   Received pt in bed having no complaints and agreeable to mobility. VSS on 3L O2. Pt was asymptomatic throughout ambulation and returned to room w/o fault. Left in bed w/ call bell in reach and all needs met.   D'Vante Earlene Plater Mobility Specialist Please contact via Special educational needs teacher or Rehab office at [phone]

## 2023-08-22 NOTE — TOC Transition Note (Addendum)
 Transition of Care [facility]) - Discharge Note   Patient Details  Name: Seth Burch MRN: [id number] Date of Birth: [birth year]  [facility] CM/SW Contact:  Harriet Masson, RN Phone Number: 08/22/2023, 12:30 PM   Clinical Narrative:    TOC following for tube feed needs and possible home iv antibiotics.    The patient is now on [facility] peptide 1.5 @ 90 ML/HR.   Awaiting to get tube feeding order signed until RD assess tolerants.    Barriers to Discharge: Continued Medical Work up   Patient Goals and CMS Choice            Discharge Placement                       Discharge Plan and Services Additional resources added to the After Visit Summary for                                       Social Drivers of Health (SDOH) Interventions SDOH Screenings   Food Insecurity: No Food Insecurity (08/21/2023)  Housing: Low Risk  (08/21/2023)  Transportation Needs: No Transportation Needs (08/21/2023)  Utilities: Not At Risk (08/21/2023)  Depression (PHQ2-9): Low Risk  (06/12/2023)  Social Connections: Moderately Integrated (08/21/2023)  Tobacco Use: Low Risk  (08/20/2023)     Readmission Risk Interventions     No data to display

## 2023-08-22 NOTE — Progress Notes (Addendum)
 Nutrition Follow-up  DOCUMENTATION CODES:   Severe malnutrition in context of chronic illness  INTERVENTION:  -Increase tube feeding rate via J-tube: Molli Posey Peptide 1.5 at 90 ml/h for 16 hours (6AM-10PM) - 1440 ml per day  Provides 2160 kcals, 107g protein, free water   - Discontinue Prosource BID as goal rate achieved - Monitor diet advancement and tolerance - Continue to encourage addition of patient purchased unflavored protein powder to low protein food items - Collect weight to assess trend s/p surgery   ADDENDUM (8:15PM): RN messaged stating patient leaking from J-tube site and is feeling full. TF paused. Recommend re-initiate tomorrow at lower rate. RN alerted on call to this, who is agreeable. New TF orders entered and are below:  Modify tube feeding via J-tube: Molli Posey Peptide 1.5 at 65 ml/h for 16 hours (6AM-10PM) - 1040 ml per day  Provides 1600 kcal, 77 gm protein, 728 ml free water daily   NUTRITION DIAGNOSIS:  Severe Malnutrition related to chronic illness (esophageal cancer s/p chemo, radiation and surgery) as evidenced by severe fat depletion, severe muscle depletion, percent weight loss. 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 Returned hours after discharge with c/o chest pain. PMH: DM, HTN, dyslipedemia, CKD, IgG kappa MGUS.  Previous Admission 3/10 EGD, XI Robotic Assisted Ivor Lewis Esophagectomy, J-tube placement 3/11 Trickle TF initiated 3/12 Surgery increased TF to 40 ml/hr 3/13 Surgery increased TF to 55 ml/hr, transferred to ICU  3/14 Surgery increased TF to 65  ml/hr (goal), bowel regimen added and +BM 3/15 Intubated, Bronch (Thick mucus in trachea suctioned, partially occluding L mainstem, ?aspiration, recurrent false vocal cord irritation with swelling leading to stridor and resp failure) 3/17 Extubated 3/18 Esophogram:  Brisk tracheal aspiration of ingested contrast, into the LEFT mainstem bronchus. SLP eval recommended. Stagnant contrast at diaphragmatic hiatus. No anastomotic leak.  3/19 Diet advanced to Dysphagia 2/Thins, later downgraded to Nectar Thick Liquids 3/20 Transition to Nocturnal TF, family to bring protein shake in from home 3/21 Significant diarrhea overnight, (9 stools in 10 hours), significant J-tube drainage, change to Vivonex with day time feedings 3/24 Diet changed to Dysphagia 2, Thins per SLP 3/26 NPO due to concern for possible esophageal leak 3/27 advanced to Dysphagia 3, thin liquids  3/31 modify TF regimen for d/c 4/1 discharged home Current Admission 4/2 admitted; OR: R VATS, EGD, and esophageal stent placement 4/3 trial Molli Posey TF formula during the day (16 hours) 4/4 advanced to goal rate  Pt tolerated TF regimen yesterday at rate of 37ml/hr x16 hours. One BM noted. Pt in the process of making his way to bedside commode this morning at time of bedside visit.  Wife reports he ate all of his applesauce at dinner last night and tolerated well. Diet remains dysphagia 1, thin liquids. RN reports patient drank 100% of protein supplement brought in by his wife (30g protein). Encouraged patient to continue to increase calorie/protein intake by mouth in an effort to reduce the length of time TF must infuse to meet estimated needs.    Admit Weight: 77.1kg Current Weight: 80.6kg  Continue to monitor weight. Will  request new weight to assess trend. Some generalized edema noted today.    Intake/Output Summary (Last 24 hours) at 08/22/2023 1021 Last data filed at 08/22/2023 0600 Gross per 24 hour  Intake 539.19 ml  Output 425  ml  Net 114.19 ml    Net IO Since Admission: 2,768.69 mL [08/22/23 1021]   Semglee increased today. Continues on IV ABX.   Drains/Lines: LUQ: Jejunostomy (16 Fr) R Lateral Pleural (19Fr): x24 hours UOP: x12 hours  Meds: SSI 0-15 Q4, Semglee 32 QD, pantoprazole Drips: IV ABX Fluconazole  Labs: Na+ 138 (wdl) K+ 4.3 (wdl)  Diet Order:   Diet Order             DIET - DYS 1 Room service appropriate? Yes; Fluid consistency: Thin  Diet effective now             EDUCATION NEEDS:   Education needs have been addressed  Skin:  Incisions: R chest tube Other: J-tube  Last BM:  4/1 - type 7 x1  Height:  Ht Readings from Last 1 Encounters:  08/20/23 5\' 10"  (1.778 m)   Weight:  Wt Readings from Last 1 Encounters:  08/20/23 80.6 kg   Ideal Body Weight:     BMI:  Body mass index is 25.5 kg/m.  Estimated Nutritional Needs:   Kcal:  2200-2400kcal  Protein:  115-130g  Fluid:  >=2L/day  Myrtie Cruise MS, RD, LDN Registered Dietitian Clinical Nutrition RD Inpatient Contact Info in Amion

## 2023-08-22 NOTE — Progress Notes (Signed)
 Patient started leaking light brown tube feeds with a very small amount of yellow bile from around J tube. Patient stated that he felt very full. Tube feeding paused. Dressing around drain changed. No other drainage noted at this time. Per Registered Dietitian will restart tube feeds at 65ml in AM.

## 2023-08-23 ENCOUNTER — Inpatient Hospital Stay (HOSPITAL_COMMUNITY)

## 2023-08-23 LAB — CBC
HCT: 26.7 % — ABNORMAL LOW (ref 39.0–52.0)
Hemoglobin: 8.5 g/dL — ABNORMAL LOW (ref 13.0–17.0)
MCH: 30.6 pg (ref 26.0–34.0)
MCHC: 31.8 g/dL (ref 30.0–36.0)
MCV: 96 fL (ref 80.0–100.0)
Platelets: 450 10*3/uL — ABNORMAL HIGH (ref 150–400)
RBC: 2.78 MIL/uL — ABNORMAL LOW (ref 4.22–5.81)
RDW: 15.2 % (ref 11.5–15.5)
WBC: 9.2 10*3/uL (ref 4.0–10.5)
nRBC: 0.3 % — ABNORMAL HIGH (ref 0.0–0.2)

## 2023-08-23 LAB — GLUCOSE, CAPILLARY
Glucose-Capillary: 85 mg/dL (ref 70–99)
Glucose-Capillary: 94 mg/dL (ref 70–99)
Glucose-Capillary: 83 mg/dL (ref 70–99)
Glucose-Capillary: 121 mg/dL — ABNORMAL HIGH (ref 70–99)
Glucose-Capillary: 116 mg/dL — ABNORMAL HIGH (ref 70–99)
Glucose-Capillary: 68 mg/dL — ABNORMAL LOW (ref 70–99)

## 2023-08-23 LAB — COMPREHENSIVE METABOLIC PANEL WITH GFR
ALT: 31 U/L (ref 0–44)
AST: 39 U/L (ref 15–41)
Albumin: 1.8 g/dL — ABNORMAL LOW (ref 3.5–5.0)
Alkaline Phosphatase: 282 U/L — ABNORMAL HIGH (ref 38–126)
Anion gap: 10 (ref 5–15)
BUN: 52 mg/dL — ABNORMAL HIGH (ref 8–23)
CO2: 21 mmol/L — ABNORMAL LOW (ref 22–32)
Calcium: 8.3 mg/dL — ABNORMAL LOW (ref 8.9–10.3)
Chloride: 109 mmol/L (ref 98–111)
Creatinine, Ser: 1.73 mg/dL — ABNORMAL HIGH (ref 0.61–1.24)
GFR, Estimated: 43 mL/min — ABNORMAL LOW (ref >60)
Glucose, Bld: 97 mg/dL (ref 70–99)
Potassium: 4.1 mmol/L (ref 3.5–5.1)
Sodium: 140 mmol/L (ref 135–145)
Total Bilirubin: 0.3 mg/dL (ref 0.0–1.2)
Total Protein: 5.9 g/dL — ABNORMAL LOW (ref 6.5–8.1)

## 2023-08-23 MED ORDER — DEXTROSE 50 % IV SOLN
INTRAVENOUS | Status: AC
  Administered 2023-08-24: 12.5 g via INTRAVENOUS
  Filled 2023-08-23: qty 50

## 2023-08-23 MED ORDER — DEXTROSE 50 % IV SOLN: 12.5000 g | Freq: Once | INTRAVENOUS | Status: AC

## 2023-08-23 NOTE — Progress Notes (Addendum)
 3 Days Post-Op Procedure(s) (LRB): VIDEO ASSISTED THORACOSCOPY (VATS)/DECORTICATION (Right) EGD (ESOPHAGOGASTRODUODENOSCOPY) (N/A) Subjective: Awake and alert, said he rested well last night.  No new complaints or concerns.  He has not eaten anything yet this morning.  Denies chest pain or abdominal pain.   Objective: Vital signs in last 24 hours: Temp:  [97.9 F (36.6 C)-98.8 F (37.1 C)] 98.8 F (37.1 C) (04/05 0749) Pulse Rate:  [84-102] 102 (04/04 1931) Cardiac Rhythm: Sinus tachycardia (04/05 0700) Resp:  [19] 19 (04/04 1931) BP: (128-138)/(75-83) 134/75 (04/05 0749) SpO2:  [91 %-96 %] 91 % (04/04 1931)    Intake/Output from previous day: 04/04 0701 - 04/05 0700 In: 380 [P.O.:120; IV Piggyback:200] Out: 685 [Urine:200; Chest Tube:485] Intake/Output this shift: Total I/O In: 120 [P.O.:120] Out: 480 [Urine:400; Chest Tube:80]  General appearance: alert, cooperative, and no distress Heart: regular rate and rhythm Lungs: Normal work of breathing, breath sounds are clear to auscultation Abdomen: Soft, no tenderness.  Has had no increase in the leakage the tube feeds around the J-tube after increasing the rate yesterday. Extremities: no edema Wound: Incisions healing with no sign of complication   Lab Results: Recent Labs    08/22/23 0508 08/23/23 0520  WBC 10.8* 9.2  HGB 7.6* 8.5*  HCT 24.3* 26.7*  PLT 488* 450*   BMET:  Recent Labs    08/22/23 0508 08/23/23 0520  NA 138 140  K 4.3 4.1  CL 110 109  CO2 20* 21*  GLUCOSE 304* 97  BUN 52* 52*  CREATININE 1.62* 1.73*  CALCIUM 8.1* 8.3*    PT/INR:  No results for input(s): "LABPROT", "INR" in the last 72 hours.  ABG    Component Value Date/Time   PHART 7.4 07/29/2023 0514   HCO3 25.8 08/02/2023 2006   TCO2 17 (L) 08/19/2023 2022   ACIDBASEDEF 1.0 08/02/2023 1506   O2SAT 94.5 08/02/2023 2006   CBG (last 3)  Recent Labs    08/22/23 2350 08/23/23 0405 08/23/23 0752  GLUCAP 92 85 94     Meds Scheduled Meds:  Chlorhexidine Gluconate Cloth  6 each Topical Daily   enoxaparin (LOVENOX) injection  40 mg Subcutaneous Q24H   Gerhardt's butt cream  1 Application Topical TID   insulin aspart  0-15 Units Subcutaneous Q4H   insulin glargine-yfgn  32 Units Subcutaneous Daily   Kate Farms Peptide 1.5  1,040 mL Per J Tube Daily   metoprolol tartrate  12.5 mg Oral BID   pantoprazole (PROTONIX) IV  40 mg Intravenous Q24H   sodium chloride flush  10-40 mL Intracatheter Q12H   tamsulosin  0.4 mg Oral Daily   Continuous Infusions:  fluconazole (DIFLUCAN) IV 400 mg (08/22/23 0842)   levofloxacin (LEVAQUIN) IV Stopped (08/21/23 1238)   PRN Meds:.acetaminophen (TYLENOL) oral liquid 160 mg/5 mL, hydrALAZINE, HYDROmorphone (DILAUDID) injection, ondansetron (ZOFRAN) IV, oxyCODONE, polyvinyl alcohol, sodium chloride flush  Xrays DG Chest 1 View Result Date: 08/22/2023 CLINICAL DATA:  Status post esophagectomy. EXAM: CHEST  1 VIEW COMPARISON:  August 21, 2023. FINDINGS: Stable cardiomediastinal silhouette. Esophageal stent is again noted. Right internal jugular Port-A-Cath is unchanged. Right-sided chest tube is noted without pneumothorax. Bibasilar atelectasis is noted with small pleural effusions. Bony thorax is unremarkable. IMPRESSION: Bibasilar atelectasis is noted with small pleural effusions. Electronically Signed   By: Lupita Raider M.D.   On: 08/22/2023 08:15   Recent Results (from the past 240 hours)  Blood Culture (routine x 2)     Status: None (Preliminary result)  Collection Time: 08/19/23  8:20 PM   Specimen: BLOOD RIGHT HAND  Result Value Ref Range Status   Specimen Description BLOOD RIGHT HAND  Final   Special Requests   Final    BOTTLES DRAWN AEROBIC AND ANAEROBIC Blood Culture results may not be optimal due to an inadequate volume of blood received in culture bottles   Culture   Final    NO GROWTH 4 DAYS Performed at [facility], [address]  [place], [place] [address]    Report Status PENDING  Incomplete  Blood Culture (routine x 2)     Status: None (Preliminary result)   Collection Time: 08/19/23 10:09 PM   Specimen: BLOOD RIGHT HAND  Result Value Ref Range Status   Specimen Description BLOOD RIGHT HAND  Final   Special Requests   Final    AEROBIC BOTTLE ONLY Blood Culture results may not be optimal due to an inadequate volume of blood received in culture bottles   Culture  Setup Time   Final    GRAM POSITIVE RODS AEROBIC BOTTLE ONLY CRITICAL RESULT CALLED TO, READ BACK BY AND VERIFIED WITH: PHARMD ELIZABETH MARTIN ON 08/21/23 @ 1530 BY DRT    Culture   Final    GRAM POSITIVE RODS IDENTIFICATION TO FOLLOW Performed at [facility], [address] [place], [place] [address]    Report Status PENDING  Incomplete  Resp panel by RT-PCR (RSV, Flu A&B, Covid) Anterior Nasal Swab     Status: None   Collection Time: 08/19/23 10:36 PM   Specimen: Anterior Nasal Swab  Result Value Ref Range Status   SARS Coronavirus 2 by RT PCR NEGATIVE NEGATIVE Final   Influenza A by PCR NEGATIVE NEGATIVE Final   Influenza B by PCR NEGATIVE NEGATIVE Final    Comment: (NOTE) The Xpert Xpress SARS-CoV-2/FLU/RSV plus assay is intended as an aid in the diagnosis of influenza from Nasopharyngeal swab specimens and should not be used as a sole basis for treatment. Nasal washings and aspirates are unacceptable for Xpert Xpress SARS-CoV-2/FLU/RSV testing.  Fact Sheet for Patients: BloggerCourse.com  Fact Sheet for Healthcare Providers: SeriousBroker.it  This test is not yet approved or cleared by the [place] FDA and has been authorized for detection and/or diagnosis of SARS-CoV-2 by FDA under an Emergency Use Authorization (EUA). This EUA will remain in effect (meaning this test can be used) for the duration of the COVID-19 declaration under Section 564(b)(1) of the Act, 21 U.S.C. section  360bbb-3(b)(1), unless the authorization is terminated or revoked.     Resp Syncytial Virus by PCR NEGATIVE NEGATIVE Final    Comment: (NOTE) Fact Sheet for Patients: BloggerCourse.com  Fact Sheet for Healthcare Providers: SeriousBroker.it  This test is not yet approved or cleared by the [place] FDA and has been authorized for detection and/or diagnosis of SARS-CoV-2 by FDA under an Emergency Use Authorization (EUA). This EUA will remain in effect (meaning this test can be used) for the duration of the COVID-19 declaration under Section 564(b)(1) of the Act, 21 U.S.C. section 360bbb-3(b)(1), unless the authorization is terminated or revoked.  Performed at [facility], [address] [place], [place] [address]   Aerobic/Anaerobic Culture w Gram Stain (surgical/deep wound)     Status: None (Preliminary result)   Collection Time: 08/20/23  2:12 PM   Specimen: Path fluid; Body Fluid  Result Value Ref Range Status   Specimen Description PLEURAL FLUID  Final   Special Requests SWAB PT  ON FLAGYL,CEFEPIME,VANC  Final   Gram Stain   Final    RARE WBC PRESENT, PREDOMINANTLY PMN FEW GRAM POSITIVE RODS RARE GRAM POSITIVE COCCI    Culture   Final    CULTURE REINCUBATED FOR BETTER GROWTH HOLDING FOR POSSIBLE ANAEROBE Performed at [facility], [address] [place], [place] [address]    Report Status PENDING  Incomplete    Assessment/Plan: S/P Procedure(s) (LRB): VIDEO ASSISTED THORACOSCOPY (VATS)/DECORTICATION (Right) EGD (ESOPHAGOGASTRODUODENOSCOPY) (N/A)  -Postop day 3 EGD with placement of esophageal stent and right VATS with decortication for anastomotic leak post esophagectomy on 3/10.  Operative cultures showed gram-positive rods and cocci, final ID and sensitivities pending.  White blood count has trended down to 9200.  Continue with empiric Levaquin and fluconazole.    -Pulm: O2 sats in the low 90s on 2  liters.  Does not appear to be in any respiratory distress.  -GI: Has been tolerating a dysphagia 1 diet without any problems although he has not eaten yet this morning.  Tube feeding was transition to a more concentrated formula and the rate was increased to 90 mL/h yesterday.  He has had an increase in the leakage of tube feeding around the J-tube exit site.  Will hold tube feedings for 4 hours and resume at a lower rate.    Continue local wound care and dressing changes as required.  He has been having expected bowel function.  The chest x-ray shows some free air under the right hemidiaphragm that is stable.  His abdominal exam remains benign.  -Heme: Expected anemia from an extended illness.  No indication for transfusion.  H&H have trended up over the past few days.  -Renal: Stage III CKD.  Creatinine near baseline, urine output not recorded accurately  -DVT prophylaxis: On daily enoxaparin, ambulating    LOS: 4 days    Leary Roca PA-C Pager [phone] 08/23/2023   Chart reviewed, patient examined, agree with above.  He feels ok and has no major complaints. Taking some po protein drinks. Tube feeds held today due to a lot of drainage around the tube that is tube feed. CXR shows increasing air collection at right base which is more likely subpulmonic ptx than free air. His abdomen is completely benign. Will repeat CXR in am.

## 2023-08-23 NOTE — Progress Notes (Signed)
 Mindy, RN, rapid response called due to new red MEWS status and to alert to transfer orders to ICU, who arrived to bedside shortly thereafter to assess.

## 2023-08-23 NOTE — Plan of Care (Signed)

## 2023-08-23 NOTE — Progress Notes (Signed)
 Mobility Specialist Progress Note;   08/23/23 1114  Mobility  Activity Ambulated with assistance in room  Level of Assistance Contact guard assist, steadying assist  Assistive Device Front wheel walker  Distance Ambulated (ft) 20 ft  Activity Response Tolerated well  Mobility Referral Yes  Mobility visit 1 Mobility  Mobility Specialist Start Time (ACUTE ONLY) 1114  Mobility Specialist Stop Time (ACUTE ONLY) 1133  Mobility Specialist Time Calculation (min) (ACUTE ONLY) 19 min   Pt agreeable to mobility w/ encouragement. Required MinG for bed mobility and during ambulation for safety. Ambulated on 3LO2, SPO2 WFL. Displayed some SOB. Distance limited to in room d/t an emergency across the hall. Pt returned back to bed with all needs met, wife in room.   Seth Burch Mobility Specialist Please contact via SecureChat or [employer] [phone]

## 2023-08-23 NOTE — Significant Event (Signed)
 Rapid Response Event Note   Reason for Call :  RED MEWS: tachycardia/tachpynea/fever  Initial Focused Assessment:  Pt lying in bed with eyes open, in no visible distress. He denies CP/SOB/dizziness/pain. His breathing is fast but not labored. Lungs are clear, decreased in the bases. ABD is soft/nontender. J-tube is leaking large amount TF(this is not new per the notes but wife at bedside is very concerned about this). J-tube dressing changed. Skin is hot to touch, dry.   T-101.5, HR-120, BP-111/64, RR-30, SpO2-94% [place] 4L   Tylenol already given by RN prior to my arrival yet fever remains. Spoke with pt about ice packs but will leave that decision up to him. At this time he wishes to decrease the temp in the room and take off one of his two blankets.   Dr Laneta Simmers notified of RED MEWS PTA RRT and ordered:  PCXR-1. Stable right-sided chest tube position with a stable right sub pulmonic pneumothorax. 2. Stable bibasilar atelectasis, right greater than left. 3. A small, stable right pleural effusion.  Tx to ICU  Interventions:  PCXR Tylenol already given at 1931.  Tx to ICU.  Plan of Care:  Tx to ICU.   Event Summary:   MD Notified: Dr. Laneta Simmers notified PTA RRT Call Time:2000 Arrival Time:2020 End Time:2120  Terrilyn Saver, RN

## 2023-08-23 NOTE — Progress Notes (Signed)
   08/23/23 1929  Assess: MEWS Score  Temp 100 F (37.8 C)  BP (!) 143/80  MAP (mmHg) 95  Pulse Rate (!) 123  ECG Heart Rate (!) 123  Resp (!) 32  Level of Consciousness Alert  SpO2 92 %  O2 Device Nasal Cannula  Patient Activity (if Appropriate) In bed  O2 Flow Rate (L/min) 4 L/min  Assess: MEWS Score  MEWS Temp 0  MEWS Systolic 0  MEWS Pulse 2  MEWS RR 2  MEWS LOC 0  MEWS Score 4  MEWS Score Color Red  Assess: if the MEWS score is Yellow or Red  Were vital signs accurate and taken at a resting state? Yes  Does the patient meet 2 or more of the SIRS criteria? Yes  MEWS guidelines implemented  Yes, red  Treat  MEWS Interventions Considered administering scheduled or prn medications/treatments as ordered  Take Vital Signs  Increase Vital Sign Frequency  Red: Q1hr x2, continue Q4hrs until patient remains green for 12hrs  Escalate  MEWS: Escalate Red: Discuss with charge nurse and notify provider. Consider notifying RRT. If remains red for 2 hours consider need for higher level of care  Notify: Charge Nurse/RN  Name of Charge Nurse/RN Notified Angie, RN  Assess: SIRS CRITERIA  SIRS Temperature  0  SIRS Respirations  1  SIRS Pulse 1  SIRS WBC 0  SIRS Score Sum  2   Upon had off with day nurse, patient was had the shaking chills. Temp was 100 orally. Tylenol given. RR 35, Tachycardia 120-130s. Oxygen 4 L w/93% saturation. MEWs turned Red. Reassessed, see flow sheet. Tylenol given. Family asking for MD to come to bedside to address leaking from J-tube and discuss condition.   Angie, RN, charge notified. Dr. Laneta Simmers paged and notified. Verbal orders to transfer to 2H.

## 2023-08-23 NOTE — Progress Notes (Signed)
 Alerted patient and family that orders were in to transfer patient to the ICU. They had concerns because the last time he was there he got delirium. Active listening and emotional support provided. Agreed to give them some time to discuss and check back.

## 2023-08-23 NOTE — Progress Notes (Signed)
 J-peg dressing saturated as well as sheet under patient. Bedding changed. Gown changed. Rapid response nurse Mindy changed the dressing and applied Gerhardt's Butt cream.

## 2023-08-23 NOTE — Progress Notes (Signed)
 Tube feeds off at 2115, per patient's request. Due to the amount of leaking, patient and patient's daughter ask for the tube feeds to be turned off for the evening.

## 2023-08-23 NOTE — Plan of Care (Signed)
  Problem: Coping: Goal: Ability to adjust to condition or change in health will improve Outcome: Progressing   Problem: Metabolic: Goal: Ability to maintain appropriate glucose levels will improve Outcome: Progressing   Problem: Nutritional: Goal: Maintenance of adequate nutrition will improve Outcome: Progressing   Problem: Skin Integrity: Goal: Risk for impaired skin integrity will decrease Outcome: Progressing   

## 2023-08-24 ENCOUNTER — Inpatient Hospital Stay (HOSPITAL_COMMUNITY)

## 2023-08-24 LAB — CBC
HCT: 24.7 % — ABNORMAL LOW (ref 39.0–52.0)
Hemoglobin: 7.9 g/dL — ABNORMAL LOW (ref 13.0–17.0)
MCH: 30.5 pg (ref 26.0–34.0)
MCHC: 32 g/dL (ref 30.0–36.0)
MCV: 95.4 fL (ref 80.0–100.0)
Platelets: 336 10*3/uL (ref 150–400)
RBC: 2.59 MIL/uL — ABNORMAL LOW (ref 4.22–5.81)
RDW: 15.6 % — ABNORMAL HIGH (ref 11.5–15.5)
WBC: 2.3 10*3/uL — ABNORMAL LOW (ref 4.0–10.5)
nRBC: 1.3 % — ABNORMAL HIGH (ref 0.0–0.2)

## 2023-08-24 LAB — CULTURE, BLOOD (ROUTINE X 2): Culture: NO GROWTH

## 2023-08-24 LAB — COMPREHENSIVE METABOLIC PANEL WITH GFR
ALT: 28 U/L (ref 0–44)
AST: 32 U/L (ref 15–41)
Albumin: 1.7 g/dL — ABNORMAL LOW (ref 3.5–5.0)
Alkaline Phosphatase: 254 U/L — ABNORMAL HIGH (ref 38–126)
Anion gap: 11 (ref 5–15)
BUN: 50 mg/dL — ABNORMAL HIGH (ref 8–23)
CO2: 19 mmol/L — ABNORMAL LOW (ref 22–32)
Calcium: 7.9 mg/dL — ABNORMAL LOW (ref 8.9–10.3)
Chloride: 105 mmol/L (ref 98–111)
Creatinine, Ser: 1.81 mg/dL — ABNORMAL HIGH (ref 0.61–1.24)
GFR, Estimated: 41 mL/min — ABNORMAL LOW (ref >60)
Glucose, Bld: 107 mg/dL — ABNORMAL HIGH (ref 70–99)
Potassium: 3.9 mmol/L (ref 3.5–5.1)
Sodium: 135 mmol/L (ref 135–145)
Total Bilirubin: 0.3 mg/dL (ref 0.0–1.2)
Total Protein: 5.3 g/dL — ABNORMAL LOW (ref 6.5–8.1)

## 2023-08-24 LAB — GLUCOSE, CAPILLARY
Glucose-Capillary: 110 mg/dL — ABNORMAL HIGH (ref 70–99)
Glucose-Capillary: 111 mg/dL — ABNORMAL HIGH (ref 70–99)
Glucose-Capillary: 119 mg/dL — ABNORMAL HIGH (ref 70–99)
Glucose-Capillary: 129 mg/dL — ABNORMAL HIGH (ref 70–99)
Glucose-Capillary: 138 mg/dL — ABNORMAL HIGH (ref 70–99)
Glucose-Capillary: 138 mg/dL — ABNORMAL HIGH (ref 70–99)
Glucose-Capillary: 86 mg/dL (ref 70–99)
Glucose-Capillary: 88 mg/dL (ref 70–99)

## 2023-08-24 MED ORDER — INSULIN ASPART 100 UNIT/ML IJ SOLN
0.0000 [IU] | Freq: Three times a day (TID) | INTRAMUSCULAR | Status: AC
  Administered 2023-08-25: 3 [IU] via SUBCUTANEOUS
  Administered 2023-08-25: 5 [IU] via SUBCUTANEOUS
  Administered 2023-08-25 – 2023-08-26 (×3): 2 [IU] via SUBCUTANEOUS
  Administered 2023-08-26: 3 [IU] via SUBCUTANEOUS
  Administered 2023-08-27 – 2023-08-28 (×2): 2 [IU] via SUBCUTANEOUS
  Administered 2023-08-28 (×2): 3 [IU] via SUBCUTANEOUS
  Administered 2023-08-29 (×2): 2 [IU] via SUBCUTANEOUS

## 2023-08-24 NOTE — Plan of Care (Signed)

## 2023-08-24 NOTE — Progress Notes (Signed)
 4 Days Post-Op Procedure(s) (LRB): VIDEO ASSISTED THORACOSCOPY (VATS)/DECORTICATION (Right) EGD (ESOPHAGOGASTRODUODENOSCOPY) (N/A) Subjective: No complaints this am  Transferred back to 2H last night because 2C could not handle his red MEWS score.  Had temp of 100 at 7 pm and max 101.8 axillary at 2 am. He is 99.9 this am.  Still had a lot of drainage of tube feeds around the J-tube after resumed at lower rate last pm so it was stopped.  Objective: Vital signs in last 24 hours: Temp:  [97.4 F (36.3 C)-101.8 F (38.8 C)] 99.9 F (37.7 C) (04/06 0600) Pulse Rate:  [93-123] 93 (04/06 0700) Cardiac Rhythm: Sinus tachycardia (04/05 2330) Resp:  [22-36] 22 (04/06 0700) BP: (94-143)/(57-96) 111/62 (04/06 0700) SpO2:  [90 %-97 %] 96 % (04/06 0700) Weight:  [81.3 kg] 81.3 kg (04/05 1524)  Hemodynamic parameters for last 24 hours:    Intake/Output from previous day: 04/05 0701 - 04/06 0700 In: 580 [P.O.:480] Out: 1380 [Urine:750; Chest Tube:630] Intake/Output this shift: No intake/output data recorded.  General appearance: alert and cooperative Neurologic: intact Heart: regular rate and rhythm Lungs: diminished breath sounds bibasilar Abdomen: soft, non-tender; bowel sounds normal Extremities: minimal edema (puffy) Wound: incisions ok Chest tube put out 630 cc yesterday, white purulent appearing. Small air leak with cough.  Lab Results: Recent Labs    08/23/23 0520 08/24/23 0236  WBC 9.2 2.3*  HGB 8.5* 7.9*  HCT 26.7* 24.7*  PLT 450* 336   BMET:  Recent Labs    08/23/23 0520 08/24/23 0236  NA 140 135  K 4.1 3.9  CL 109 105  CO2 21* 19*  GLUCOSE 97 107*  BUN 52* 50*  CREATININE 1.73* 1.81*  CALCIUM 8.3* 7.9*    PT/INR: No results for input(s): "LABPROT", "INR" in the last 72 hours. ABG    Component Value Date/Time   PHART 7.4 07/29/2023 0514   HCO3 25.8 08/02/2023 2006   TCO2 17 (L) 08/19/2023 2022   ACIDBASEDEF 1.0 08/02/2023 1506   O2SAT 94.5  08/02/2023 2006   CBG (last 3)  Recent Labs    08/24/23 0001 08/24/23 0242 08/24/23 0553  GLUCAP 111* 110* 88   CXR: stable, small but fluctuating right sub-pulmonic ptx probably related to air leaking from esophagus or could be small air leak from lung.   Assessment/Plan: S/P Procedure(s) (LRB): VIDEO ASSISTED THORACOSCOPY (VATS)/DECORTICATION (Right) EGD (ESOPHAGOGASTRODUODENOSCOPY) (N/A) POD 4 EGD and esophageal stent, right VATS for decortication for anastomotic leak post esophagectomy 07/28/23.  He is hemodynamically stable in sinus rhythm 90's. RR stable 24. Sats 96% on RA.  Continuing on Levaquin and fluconazole. Pleural culture growing lactobacillus. WBC ct normal.  Hold off on tube feeding for now since so much is leaking around the tube. Encourage po diet. He says he is swallowing fine.  Continue IS, ambulation.  Chest tube to water seal.  Stage 3 CKD: creat fairly stable.   Anemia: down today from yesterday. Will repeat labs in am.   LOS: 5 days    Alleen Borne 08/24/2023

## 2023-08-25 ENCOUNTER — Inpatient Hospital Stay (HOSPITAL_COMMUNITY)

## 2023-08-25 DIAGNOSIS — E43 Unspecified severe protein-calorie malnutrition: Secondary | ICD-10-CM | POA: Diagnosis not present

## 2023-08-25 DIAGNOSIS — J869 Pyothorax without fistula: Secondary | ICD-10-CM | POA: Diagnosis not present

## 2023-08-25 DIAGNOSIS — K9189 Other postprocedural complications and disorders of digestive system: Secondary | ICD-10-CM | POA: Diagnosis not present

## 2023-08-25 DIAGNOSIS — N179 Acute kidney failure, unspecified: Secondary | ICD-10-CM | POA: Diagnosis not present

## 2023-08-25 LAB — BASIC METABOLIC PANEL WITH GFR
Anion gap: 10 (ref 5–15)
BUN: 55 mg/dL — ABNORMAL HIGH (ref 8–23)
CO2: 19 mmol/L — ABNORMAL LOW (ref 22–32)
Calcium: 7.8 mg/dL — ABNORMAL LOW (ref 8.9–10.3)
Chloride: 104 mmol/L (ref 98–111)
Creatinine, Ser: 1.92 mg/dL — ABNORMAL HIGH (ref 0.61–1.24)
GFR, Estimated: 38 mL/min — ABNORMAL LOW (ref >60)
Glucose, Bld: 145 mg/dL — ABNORMAL HIGH (ref 70–99)
Potassium: 3.6 mmol/L (ref 3.5–5.1)
Sodium: 133 mmol/L — ABNORMAL LOW (ref 135–145)

## 2023-08-25 LAB — CBC
HCT: 21.6 % — ABNORMAL LOW (ref 39.0–52.0)
Hemoglobin: 6.9 g/dL — CL (ref 13.0–17.0)
MCH: 30.8 pg (ref 26.0–34.0)
MCHC: 31.9 g/dL (ref 30.0–36.0)
MCV: 96.4 fL (ref 80.0–100.0)
Platelets: 316 10*3/uL (ref 150–400)
RBC: 2.24 MIL/uL — ABNORMAL LOW (ref 4.22–5.81)
RDW: 16 % — ABNORMAL HIGH (ref 11.5–15.5)
WBC: 4.5 10*3/uL (ref 4.0–10.5)
nRBC: 0 % (ref 0.0–0.2)

## 2023-08-25 LAB — GLUCOSE, CAPILLARY
Glucose-Capillary: 149 mg/dL — ABNORMAL HIGH (ref 70–99)
Glucose-Capillary: 226 mg/dL — ABNORMAL HIGH (ref 70–99)
Glucose-Capillary: 200 mg/dL — ABNORMAL HIGH (ref 70–99)
Glucose-Capillary: 135 mg/dL — ABNORMAL HIGH (ref 70–99)
Glucose-Capillary: 112 mg/dL — ABNORMAL HIGH (ref 70–99)
Glucose-Capillary: 178 mg/dL — ABNORMAL HIGH (ref 70–99)

## 2023-08-25 LAB — AEROBIC/ANAEROBIC CULTURE W GRAM STAIN (SURGICAL/DEEP WOUND)

## 2023-08-25 LAB — CULTURE, BLOOD (ROUTINE X 2)

## 2023-08-25 LAB — PREPARE RBC (CROSSMATCH)

## 2023-08-25 MED ORDER — SODIUM CHLORIDE 0.9% IV SOLUTION: Freq: Once | INTRAVENOUS | Status: AC

## 2023-08-25 MED ORDER — SODIUM CHLORIDE 0.9 % IV SOLN
3.0000 g | Freq: Four times a day (QID) | INTRAVENOUS | Status: AC
  Administered 2023-08-25 – 2023-09-07 (×54): 3 g via INTRAVENOUS
  Filled 2023-08-25 (×54): qty 8

## 2023-08-25 MED ORDER — POTASSIUM CHLORIDE 20 MEQ PO PACK
20.0000 meq | PACK | ORAL | Status: AC
  Administered 2023-08-25 (×3): 20 meq
  Filled 2023-08-25 (×3): qty 1

## 2023-08-25 NOTE — TOC Initial Note (Signed)
 Transition of Care [facility]) - Initial/Assessment Note    Patient Details  Name: Seth Burch MRN: [id number] Date of Birth: [birth year]  Transition of Care [facility]) CM/SW Contact:    Elliot Cousin, RN Phone Number: [phone] 08/25/2023, 8:02 PM  Clinical Narrative:                  TOC CM spoke to pt and wife at bedside. Pt was active with [facility] He has tube feedings at home.  Tube Feedings with [employer]. TF formula has changed. Will need new orders for [facility] and TF for home.    Expected Discharge Plan: Home w Home Health Services Barriers to Discharge: Continued Medical Work up   Patient Goals and CMS Choice Patient states their goals for this hospitalization and ongoing recovery are:: wants to remain independent CMS Medicare.gov Compare Post Acute Care list provided to:: Patient Choice offered to / list presented to : Spouse      Expected Discharge Plan and Services   Discharge Planning Services: CM Consult Post Acute Care Choice: Home Health Living arrangements for the past 2 months: Single Family Home                           HH Arranged: RN, PT, OT, Speech Therapy HH Agency: [facility]        Prior Living Arrangements/Services Living arrangements for the past 2 months: Single Family Home Lives with:: Spouse Patient language and need for interpreter reviewed:: Yes Do you feel safe going back to the place where you live?: Yes      Need for Family Participation in Patient Care: Yes (Comment) Care giver support system in place?: Yes (comment) Current home services: DME (rolling walker, bedside commode) Criminal Activity/Legal Involvement Pertinent to Current Situation/Hospitalization: No - Comment as needed  Activities of Daily Living   ADL Screening (condition at time of admission) Independently performs ADLs?: No Does the patient have a NEW difficulty with bathing/dressing/toileting/self-feeding that is expected to last >3 days?: Yes  (Initiates electronic notice to provider for possible OT consult) Does the patient have a NEW difficulty with getting in/out of bed, walking, or climbing stairs that is expected to last >3 days?: Yes (Initiates electronic notice to provider for possible PT consult) Does the patient have a NEW difficulty with communication that is expected to last >3 days?: No Is the patient deaf or have difficulty hearing?: No Does the patient have difficulty seeing, even when wearing glasses/contacts?: No Does the patient have difficulty concentrating, remembering, or making decisions?: No  Permission Sought/Granted Permission sought to share information with : Case Manager, Family Supports, PCP Permission granted to share information with : Yes, Verbal Permission Granted  Share Information with NAME: Terrel Nesheiwat  Permission granted to share info w AGENCY: Home Health  Permission granted to share info w Relationship: wife  Permission granted to share info w Contact Information: [phone]  Emotional Assessment Appearance:: Appears stated age Attitude/Demeanor/Rapport: Engaged Affect (typically observed): Accepting Orientation: : Oriented to Self, Oriented to Place, Oriented to  Time, Oriented to Situation   Psych Involvement: No (comment)  Admission diagnosis:  SIRS (systemic inflammatory response syndrome) (HCC) [R65.10] Esophagectomy, anastomotic leak [K91.89] Chest pain [R07.9] Patient Active Problem List   Diagnosis Date Noted   Protein-calorie malnutrition, severe 08/22/2023   Esophagectomy, anastomotic leak 08/20/2023   Chest pain 08/19/2023   Malnutrition of moderate degree 07/30/2023   Esophageal  cancer (HCC) 07/28/2023   H/O esophagectomy 07/28/2023   Chemotherapy-induced thrombocytopenia 05/22/2023   Port-A-Cath in place 05/02/2023   Leukopenia due to antineoplastic chemotherapy (HCC) 05/02/2023   CKD (chronic kidney disease) stage 3, GFR 30-59 ml/min (HCC) 04/24/2023   Dysphagia  03/27/2023   Unintentional weight loss 03/27/2023   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 [place] [address] Phone: [phone] Fax: [phone]     Social Drivers of Health (SDOH) Social History: SDOH Screenings   Food Insecurity: No Food Insecurity (08/21/2023)  Housing: Low Risk  (08/21/2023)  Transportation Needs: No Transportation Needs (08/21/2023)  Utilities: Not At Risk (08/21/2023)  Depression (PHQ2-9): Low Risk  (06/12/2023)  Social Connections: Moderately Integrated (08/21/2023)  Tobacco Use: Low Risk  (08/20/2023)   SDOH Interventions:     Readmission Risk Interventions     No data to display

## 2023-08-25 NOTE — Consult Note (Signed)
 NAME:  SANFORD LINDBLAD, MRN:  [id number], DOB:  [birth year], LOS: 6 ADMISSION DATE:  08/19/2023, CONSULTATION DATE:  4/7 REFERRING MD:  lightfoot, CHIEF COMPLAINT:  post-op care    History of Present Illness:  68 year old male patient who is status post esophagectomy on 07/28/2023 Discharged after 22 days with complicated course due to aspiration pneumonia, respiratory failure, AKI and delirium.  Follow-up swallowing studies were negative for leaks completed antibiotics on 3/27 had been discharged on 4/1 was eating dinner that evening and developed sudden onset of right sided chest discomfort.  He reported to the emergency room at this time showed new tachycardia, chest x-ray with increased opacity at right base white blood cell count 15.7 up from 12,000 and a lactate of 2.8 he was started on IV fluids, administered IV antibiotics, and thoracic surgery was consulted.  Diagnostic imaging was consistent with esophageal leak at the anastomosis site with associated pleural effusion he therefore went to the operating room for video-assisted thoracoscopy with decortication, as well as esophageal stent placement on 4/2.  Postoperative course fairly unremarkable, recovering in the intensive care, receiving tube feeds via J-tube, and dysphagia 1 diet was started.  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  He is status post radiation and chemotherapy January 2025 follow-up PET scan showed substantial improvement in hypermetabolic activity.  Just discharged following robotic assisted esophagectomy and placement of jejunostomy tube 3/10  we could check a left now I do not want to nSignificant Hospital Events: Including procedures, antibiotic start and stop dates in addition  to other pertinent events   Discharged post esophagectomy, readmitted later that day.Initially started on vancomycin and cefepime,  4/2, VATS with decortication and esophageal stent placement  4/3 changed to fluconazole and levofloxacin on  4/4 through 4/6: Had some J-tube leakage.Tolerating dysphagia 1 diet 4/7 pulmonary/critical care asked to evaluate and assist with ICU care culturesFrom just growing moderate lactobacillus species and few Rothia MUCILAGINOSA, change antibiotics to Unasyn  Interim History / Subjective:  No distress little frustrated from J tube leak   Objective   Blood pressure (!) 101/59, pulse 76, temperature 97.9 F (36.6 C), temperature source Oral, resp. rate 18, height 5\' 10"  (1.778 m), weight 78.5 kg, SpO2 93%.        Intake/Output Summary (Last 24 hours) at 08/25/2023 1544 Last data filed at 08/25/2023 0900 Gross per 24 hour  Intake 360 ml  Output 93 ml  Net 267 ml   Filed Weights   08/20/23 2234 08/23/23 1524 08/25/23 0600  Weight: 80.6 kg 81.3 kg 78.5 kg    Examination: General: 68 year old male laying in bed no distress  HENT: NCAT no JVD  Lungs: dec right base. Currently on room air. CT chest about 70 cc milky purulent output since last sahara change no airleak right CT more apically oriented minimal residual R base vol loss Cardiovascular: rrr Abdomen: soft leaking at J tube Extremities: warm and dry  Neuro: intact  GU: cl yellow   Resolved Hospital Problem list     Assessment & Plan:  Esophageal leak complicated by right sided empyema now status post video-assisted thoracotomy with decortication and esophageal stent Plan Day #6 antibiotics, changed to Unasyn today on 4/7, continue fluconazole Follow-up culture sensitivities Chest tube management  per thoracic surgery Dysphagia 1 diet Advance tube feeds per surgical team Trend CBC Mobilize As needed analgesia  J tube leak Plan Balloon inflated earlier by surgical team  Defer to them  re: next step   Recurrent AKI with mild nonanion gap metabolic acidosis.  Serum creatinine little worse today. Plan Keep euvolemic Renal dose medications as indicated Strict intake and output A.m. chemistry  Acute blood loss anemia.  Expected postsurgery.  No active bleeding currently. Plan Status posttransfusion 1 unit Follow-up CBC a.m.  History of diabetes with hyperglycemia Plan Sliding scale insulin  History of hypertension Plan Continuing his Lopressor 12.5 mg twice daily Holding his Norvasc currently  History of hyperlipidemia Plan Lipitor currently on hold, will check a.m. LFTs probably resume tomorrow  BPH Plan Continue Flomax  Esophageal cancer status post esophagectomy as well as radiation and chemo Plan Follow-up with heme-onc/surgery as indicated  Best Practice (right click and "Reselect all SmartList Selections" daily)   Diet/type: dysphagia diet (see orders) DVT prophylaxis SCD Pressure ulcer(s): N/A GI prophylaxis: PPI Lines: N/A Foley:  N/A Code Status:  full code Last date of multidisciplinary goals of care discussion [per primary ]  Labs   CBC: Recent Labs  Lab 08/19/23 2018 08/19/23 2022 08/21/23 0235 08/22/23 0508 08/23/23 0520 08/24/23 0236 08/25/23 0343  WBC 15.7*   < > 11.5* 10.8* 9.2 2.3* 4.5  NEUTROABS 13.6*  --   --   --   --   --   --   HGB 9.4*   < > 7.9* 7.6* 8.5* 7.9* 6.9*  HCT 29.4*   < > 24.4* 24.3* 26.7* 24.7* 21.6*  MCV 94.8   < > 95.7 96.0 96.0 95.4 96.4  PLT 619*   < > 407* 488* 450* 336 316   < > = values in this interval not displayed.    Basic Metabolic Panel: Recent Labs  Lab 08/21/23 0235 08/22/23 0508 08/23/23 0520 08/24/23 0236 08/25/23 0343  NA 134* 138 140 135 133*  K 4.3 4.3 4.1 3.9 3.6  CL 107 110 109 105 104  CO2 16* 20* 21* 19* 19*  GLUCOSE 391* 304* 97 107* 145*  BUN 46* 52* 52* 50* 55*  CREATININE 1.84* 1.62* 1.73* 1.81* 1.92*  CALCIUM 7.9* 8.1* 8.3* 7.9* 7.8*   GFR: Estimated  Creatinine Clearance: 39.1 mL/min (A) (by C-G formula based on SCr of 1.92 mg/dL (H)). Recent Labs  Lab 08/19/23 2034 08/19/23 2215 08/20/23 0155 08/20/23 0547 08/20/23 0601 08/21/23 0235 08/22/23 0508 08/23/23 0520 08/24/23 0236 08/25/23 0343  WBC  --   --   --    < >  --    < > 10.8* 9.2 2.3* 4.5  LATICACIDVEN 3.0* 3.4* 1.9  --  2.2*  --   --   --   --   --    < > = values in this interval not displayed.    Liver Function Tests: Recent Labs  Lab 08/20/23 0547 08/21/23 0235 08/22/23 0508 08/23/23 0520 08/24/23 0236  AST 19 18 22  39 32  ALT 26 22 21 31 28   ALKPHOS 217* 177* 196* 282* 254*  BILITOT 0.6 0.4 0.2 0.3 0.3  PROT 6.2* 5.6* 5.7* 5.9* 5.3*  ALBUMIN 2.0* 1.6* 1.6* 1.8* 1.7*   No results for input(s): "LIPASE", "AMYLASE" in the last 168 hours. No results for input(s): "AMMONIA" in the last 168 hours.  ABG    Component Value Date/Time   PHART 7.4 07/29/2023 0514   PCO2ART 36  07/29/2023 0514   PO2ART 70 (L) 07/29/2023 0514   HCO3 25.8 08/02/2023 2006   TCO2 17 (L) 08/19/2023 2022   ACIDBASEDEF 1.0 08/02/2023 1506   O2SAT 94.5 08/02/2023 2006     Coagulation Profile: Recent Labs  Lab 08/19/23 2018 08/20/23 0937  INR 1.2 1.2    Cardiac Enzymes: No results for input(s): "CKTOTAL", "CKMB", "CKMBINDEX", "TROPONINI" in the last 168 hours.  HbA1C: Hgb A1c MFr Bld  Date/Time Value Ref Range Status  07/24/2023 11:45 AM 7.1 (H) 4.8 - 5.6 % Final    Comment:    (NOTE) Pre diabetes:          5.7%-6.4%  Diabetes:              >6.4%  Glycemic control for   <7.0% adults with diabetes     CBG: Recent Labs  Lab 08/24/23 1928 08/24/23 2309 08/25/23 0327 08/25/23 0801 08/25/23 1132  GLUCAP 138* 138* 149* 226* 200*    Review of Systems:   C/o leaking at J site Poor appetite Pain managed No SOB or CP   Past Medical History:  He,  has a past medical history of Arthritis, Cancer (HCC), Chronic kidney disease, Diabetes mellitus (HCC),  Dyslipidemia, and Hypertension.   Surgical History:   Past Surgical History:  Procedure Laterality Date   ESOPHAGOGASTRODUODENOSCOPY N/A 07/28/2023   Procedure: EGD (ESOPHAGOGASTRODUODENOSCOPY);  Surgeon: [name], MD;  Location: [facility] OR;  Service: Thoracic;  Laterality: N/A;   ESOPHAGOGASTRODUODENOSCOPY N/A 08/20/2023   Procedure: EGD (ESOPHAGOGASTRODUODENOSCOPY);  Surgeon: [name], MD;  Location: [facility] OR;  Service: Thoracic;  Laterality: N/A;   ESOPHAGOGASTRODUODENOSCOPY (EGD) WITH PROPOFOL N/A 04/04/2023   Procedure: ESOPHAGOGASTRODUODENOSCOPY (EGD) WITH PROPOFOL;  Surgeon: Jeani Hawking, MD;  Location: [facility] ENDOSCOPY;  Service: Gastroenterology;  Laterality: N/A;   IR IMAGING GUIDED PORT INSERTION  04/09/2023   UPPER ESOPHAGEAL ENDOSCOPIC ULTRASOUND (EUS) N/A 04/04/2023   Procedure: UPPER ESOPHAGEAL ENDOSCOPIC ULTRASOUND (EUS);  Surgeon: Jeani Hawking, MD;  Location: [facility];  Service: Gastroenterology;  Laterality: N/A;   VIDEO ASSISTED THORACOSCOPY (VATS)/DECORTICATION Right 08/20/2023   Procedure: VIDEO ASSISTED THORACOSCOPY (VATS)/DECORTICATION;  Surgeon: [name], MD;  Location: [facility] OR;  Service: Thoracic;  Laterality: Right;     Social History:   reports that he has never smoked. He has never used smokeless tobacco. He reports current alcohol use of about 1.0 standard drink of alcohol per week. He reports that he does not use drugs.   Family History:  His family history is not on file.   Allergies No Known Allergies   Home Medications  Prior to Admission medications   Medication Sig Start Date End Date Taking? Authorizing Provider  acetaminophen (TYLENOL) 325 MG tablet Take 1-2 tablets (325-650 mg total) by mouth every 4 (four) hours as needed. 08/19/23 08/18/24 Yes Barrett, Erin R, PA-C  amLODipine (NORVASC) 10 MG tablet Take 1 tablet (10 mg total) by mouth daily. 08/19/23  Yes Barrett, Rae Roam, PA-C  aspirin EC 81 MG tablet Take 81 mg by mouth  daily.   Yes [provider]  atorvastatin (LIPITOR) 20 MG tablet Take 20 mg by mouth at bedtime. 02/26/19  Yes [provider]  LANTUS SOLOSTAR 100 UNIT/ML Solostar Pen Inject 26 Units into the skin daily. 02/19/23  Yes [provider]  Menthol-Methyl Salicylate (ICY HOT ORIGINAL PAIN RELIEF) 10-30 % CREA Apply 1 Application topically as needed (Rt Shoulder and back pain.).   Yes [provider]  metoprolol tartrate (LOPRESSOR) 25  MG tablet Take 0.5 tablets (12.5 mg total) by mouth 2 (two) times daily. 08/19/23  Yes Barrett, Erin R, PA-C  Nystatin (GERHARDT'S BUTT CREAM) CREA Apply 1 Application topically 3 (three) times daily. 08/19/23  Yes Barrett, Erin R, PA-C  omeprazole (PRILOSEC) 40 MG capsule Take 40 mg by mouth daily. 03/13/23  Yes [provider]  oxyCODONE (OXY IR/ROXICODONE) 5 MG immediate release tablet Take 1 tablet (5 mg total) by mouth every 4 (four) hours as needed for severe pain (pain score 7-10). 08/19/23  Yes Barrett, Erin R, PA-C  Protein (FEEDING SUPPLEMENT, PROSOURCE TF20,) liquid Place 60 mLs into feeding tube 2 (two) times daily. 08/19/23  Yes Barrett, Erin R, PA-C  tamsulosin (FLOMAX) 0.4 MG CAPS capsule Take 0.4 mg by mouth daily. 02/08/23  Yes [provider]     Critical care time: NA    My time 34 min

## 2023-08-25 NOTE — Progress Notes (Signed)
 PT Cancellation Note  Patient Details Name: [name] MRN: [id number] DOB: [birth year]   Cancelled Treatment:    Reason Eval/Treat Not Completed: Other (comment)  Patient recently back to bed per RN. Will reattempt as schedule permits.    Jerolyn Center, PT Acute Rehabilitation Services  Office [phone]  Zena Amos 08/25/2023, 9:32 AM

## 2023-08-25 NOTE — Progress Notes (Signed)
 PT Cancellation Note  Patient Details Name: Seth Burch MRN: [id number] DOB: 1956-03-08   Cancelled Treatment:    Reason Eval/Treat Not Completed: Patient declined  RN and PT attempted to persuade patient to get up or at least do bed-level exercises. Patient continued to refuse due to feeling "washed out."   Jerolyn Center, PT Acute Rehabilitation Services  Office [phone]    Zena Amos 08/25/2023, 3:50 PM

## 2023-08-25 NOTE — Progress Notes (Addendum)
 Nutrition Follow-up  DOCUMENTATION CODES:   Severe malnutrition in context of chronic illness  INTERVENTION:  -Re-initiate tube feeding rate via J-tube: Molli Posey Peptide 1.5 at 40 ml/h for 16 hours (6AM-10PM) - 640 ml per day Add Prosource TF20 60 ml BID   Provides 1144 kcal, 80 gm protein, 287 ml free water daily    - Monitor diet advancement and tolerance - Calorie Count to assess PO intake and monitor for appropriateness of TPN - Continue to encourage addition of patient purchased unflavored protein powder to low protein food items -RECOMMEND TPN to adequately meet patient nutritional needs   NUTRITION DIAGNOSIS:  Severe Malnutrition related to chronic illness (esophageal cancer s/p chemo, radiation and surgery) as evidenced by severe fat depletion, severe muscle depletion, percent weight loss. - remains applicable  GOAL:  Patient will meet greater than or equal to 90% of their needs - not progressing  MONITOR:  PO intake, Diet advancement, TF tolerance  REASON FOR ASSESSMENT:  New TF   ASSESSMENT:   Pt recently discharged from [facility] after complicated admission (3/10-4/1) d/t adenocarcinoma of distal third esophagus s/p chemo and radiation and admitted for scheduled esophagectomy. Returned hours after discharge with c/o chest pain. PMH: DM, HTN, dyslipedemia, CKD, IgG kappa MGUS.  Previous Admission 3/10 EGD, XI Robotic Assisted Ivor Lewis Esophagectomy, J-tube placement 3/11 Trickle TF initiated 3/12 Surgery increased TF to 40 ml/hr 3/13 Surgery increased TF to 55 ml/hr, transferred to ICU  3/14 Surgery increased TF to 65 ml/hr (goal), bowel regimen added and +BM 3/15 Intubated, Bronch (Thick mucus in trachea suctioned, partially occluding L mainstem, ?aspiration, recurrent false vocal cord irritation with swelling leading to stridor and resp failure) 3/17 Extubated 3/18 Esophogram:  Brisk tracheal aspiration of ingested contrast, into the LEFT mainstem bronchus. SLP  eval recommended. Stagnant contrast at diaphragmatic hiatus. No anastomotic leak.  3/19 Diet advanced to Dysphagia 2/Thins, later downgraded to Nectar Thick Liquids 3/20 Transition to Nocturnal TF, family to bring protein shake in from home 3/21 Significant diarrhea overnight, (9 stools in 10 hours), significant J-tube drainage, change to Vivonex with day time feedings 3/24 Diet changed to Dysphagia 2, Thins per SLP 3/26 NPO due to concern for possible esophageal leak 3/27 advanced to Dysphagia 3, thin liquids  3/31 modify TF regimen for d/c 4/1 discharged home Current Admission 4/2 admitted; OR: R VATS, EGD, and esophageal stent placement 4/3 trial Molli Posey TF formula during the day (16 hours) 4/4 advanced to goal rate; leaking J-tube, TF held  4/5 txr to cardiovascular ICU 4/7 TF re-initiated, transfused  RN messaged RD on Friday evening (4/4) stating patient w/ significant amount of tube feed leaking from J-tube site. Patient also reporting feeling very full. TF turned off and re-initiated at lower rate of 78ml/hr on 4/5. Patient transferred to cardiovascular ICU over the weekend.   Spoke with patient and his wife at bedside this morning. He confirmed the episode of leaking on Friday. States he feels bloated in his lower abdomen, however no N/V or diarrhea. He and his wife are not amicable to increasing tube feed rate at this time. Currently running at 73mL/hr after having been off TF regimen since 4/5. Discussed the risk of patient not adequately meeting his needs in the current setting. They verbalized understanding.   RN reporting patient ate no breakfast or lunch today. Family asked about protein supplement for patient, which he refused as well. He is not amicable to any of his meal trays due to any odor  emanating from his tray will cause nausea. He could not endorse any foods he would be willing to consume on a tray or as a snack. Will only consume applesauce. Encouraged adding protein  powder to apple sauce for added nutrition support.  Wife has been bringing in [employer] Protein supplements, which provide 30g protein per offering, however only 160 calories. Even consuming 3x per day, will only meet 22% of estimated calorie needs.   If he remains at current TF rate of 67ml/hr x16 hours per day and Prosource TF x2 per day, patient can meet approximately 52% of estimated calorie needs and 70% of estimated protein needs.  Given that patient is not sufficiently meeting needs via TF and/or orally, will recommend calorie count to assess PO intake and supplementation requirements to adequately and consistently meet estimated nutritional needs via artificial nutrition. Reviewed with patient and his wife. Alerted RN to initiation of calorie count. Strongly recommend starting TPN to allow for both adequate nutrition and healing of anastomosis. Alerted surgery and CCM to this recommendation. Will re-assess in 48 hours at completion of calorie count.  Admit Weight: 77.1kg Current Weight: 78.5kg   Weight remains stable, however likely masked by fluid received during surgical intervention upon readmission and transfer between units. Continues with generalized edema to BLEs that is mild and pitting in nature. Bowels are stable. Documented with one BM yesterday.    Intake/Output Summary (Last 24 hours) at 08/25/2023 1126 Last data filed at 08/25/2023 0900 Gross per 24 hour  Intake 417.39 ml  Output 693 ml  Net -275.61 ml    Net IO Since Admission: 1,863.25 mL [08/25/23 1126]     Pt required transfusion today. Continues on IV ABX. Sodium remains marginally low. Crt trending up. Semglee discontinued. Potassium chloride supplementation ordered today.   Drains/Lines: LUQ: Jejunostomy (16 Fr) R Lateral Pleural (19Fr): x24 hours UOP: x24 hours   Meds: SSI 0-15 TID, pantoprazole, KCl, pantoprazole Drips: IV ABX Fluconazole   Labs: Na+ 140>135>133 (L) K+ 3.6 (wdl) Hgb  8.5>7.9>6.9 (L) Crt 1.73>1.81>1.92 (H) Lactic Acid 2.2 (4/2) CBGs 107-145 x24 hours A1c 7.1 (07/2023)  Diet Order:   Diet Order             DIET - DYS 1 Room service appropriate? Yes with Assist; Fluid consistency: Thin  Diet effective now            EDUCATION NEEDS:  Education needs have been addressed  Skin:  Incisions: R chest tube Other: J-tube  Last BM:  4/6 - type 6 x1  Height:  Ht Readings from Last 1 Encounters:  08/20/23 5\' 10"  (1.778 m)   Weight:  Wt Readings from Last 1 Encounters:  08/25/23 78.5 kg    BMI:  Body mass index is 24.83 kg/m.  Estimated Nutritional Needs:   Kcal:  2200-2400kcal  Protein:  115-130g  Fluid:  >=2L/day  Myrtie Cruise MS, RD, LDN Registered Dietitian Clinical Nutrition RD Inpatient Contact Info in Amion

## 2023-08-25 NOTE — Plan of Care (Signed)
  Problem: Coping: Goal: Ability to adjust to condition or change in health will improve Outcome: Progressing   Problem: Fluid Volume: Goal: Ability to maintain a balanced intake and output will improve Outcome: Progressing   Problem: Health Behavior/Discharge Planning: Goal: Ability to identify and utilize available resources and services will improve Outcome: Progressing Goal: Ability to manage health-related needs will improve Outcome: Progressing   Problem: Metabolic: Goal: Ability to maintain appropriate glucose levels will improve Outcome: Progressing   Problem: Skin Integrity: Goal: Risk for impaired skin integrity will decrease Outcome: Progressing   Problem: Tissue Perfusion: Goal: Adequacy of tissue perfusion will improve Outcome: Progressing   Problem: Coping: Goal: Level of anxiety will decrease Outcome: Progressing

## 2023-08-25 NOTE — Progress Notes (Signed)
     [address]       [place] [address]             [phone]       No events  Vitals:   08/25/23 0915 08/25/23 0930  BP: 106/60 (!) 101/59  Pulse: 79 76  Resp: (!) 24 18  Temp:    SpO2: 93% 93%   Alert NAD Nontoxic White chest tube output J-tube balloon re-inflated  S/p MIE now with anastomotic leak Will restart tube feeds Continue abx Dispo planning  Inmer Nix O Hassani Sliney

## 2023-08-26 DIAGNOSIS — D62 Acute posthemorrhagic anemia: Secondary | ICD-10-CM

## 2023-08-26 DIAGNOSIS — E43 Unspecified severe protein-calorie malnutrition: Secondary | ICD-10-CM

## 2023-08-26 DIAGNOSIS — J869 Pyothorax without fistula: Secondary | ICD-10-CM

## 2023-08-26 DIAGNOSIS — K9189 Other postprocedural complications and disorders of digestive system: Secondary | ICD-10-CM | POA: Diagnosis not present

## 2023-08-26 DIAGNOSIS — G9341 Metabolic encephalopathy: Secondary | ICD-10-CM

## 2023-08-26 DIAGNOSIS — N179 Acute kidney failure, unspecified: Secondary | ICD-10-CM

## 2023-08-26 LAB — CBC
HCT: 26.2 % — ABNORMAL LOW (ref 39.0–52.0)
Hemoglobin: 8.6 g/dL — ABNORMAL LOW (ref 13.0–17.0)
MCH: 30.6 pg (ref 26.0–34.0)
MCHC: 32.8 g/dL (ref 30.0–36.0)
MCV: 93.2 fL (ref 80.0–100.0)
Platelets: 341 10*3/uL (ref 150–400)
RBC: 2.81 MIL/uL — ABNORMAL LOW (ref 4.22–5.81)
RDW: 16.3 % — ABNORMAL HIGH (ref 11.5–15.5)
WBC: 7.4 10*3/uL (ref 4.0–10.5)
nRBC: 0.3 % — ABNORMAL HIGH (ref 0.0–0.2)

## 2023-08-26 LAB — TYPE AND SCREEN
ABO/RH(D): B NEG
Antibody Screen: NEGATIVE
Unit division: 0

## 2023-08-26 LAB — BASIC METABOLIC PANEL WITH GFR
Anion gap: 11 (ref 5–15)
BUN: 51 mg/dL — ABNORMAL HIGH (ref 8–23)
CO2: 18 mmol/L — ABNORMAL LOW (ref 22–32)
Calcium: 8 mg/dL — ABNORMAL LOW (ref 8.9–10.3)
Chloride: 104 mmol/L (ref 98–111)
Creatinine, Ser: 1.87 mg/dL — ABNORMAL HIGH (ref 0.61–1.24)
GFR, Estimated: 39 mL/min — ABNORMAL LOW (ref >60)
Glucose, Bld: 203 mg/dL — ABNORMAL HIGH (ref 70–99)
Potassium: 4 mmol/L (ref 3.5–5.1)
Sodium: 133 mmol/L — ABNORMAL LOW (ref 135–145)

## 2023-08-26 LAB — BPAM RBC
Blood Product Expiration Date: 202504172359
ISSUE DATE / TIME: 202504070815
Unit Type and Rh: 1700

## 2023-08-26 LAB — GLUCOSE, CAPILLARY
Glucose-Capillary: 193 mg/dL — ABNORMAL HIGH (ref 70–99)
Glucose-Capillary: 159 mg/dL — ABNORMAL HIGH (ref 70–99)
Glucose-Capillary: 140 mg/dL — ABNORMAL HIGH (ref 70–99)
Glucose-Capillary: 129 mg/dL — ABNORMAL HIGH (ref 70–99)
Glucose-Capillary: 101 mg/dL — ABNORMAL HIGH (ref 70–99)
Glucose-Capillary: 104 mg/dL — ABNORMAL HIGH (ref 70–99)

## 2023-08-26 MED ORDER — OXYCODONE HCL 5 MG PO TABS
5.0000 mg | ORAL_TABLET | ORAL | Status: DC | PRN
  Administered 2023-08-26: 5 mg via ORAL
  Filled 2023-08-26: qty 1

## 2023-08-26 MED ORDER — ATORVASTATIN CALCIUM 10 MG PO TABS
20.0000 mg | ORAL_TABLET | Freq: Every day | ORAL | Status: DC
  Administered 2023-08-26 – 2023-09-15 (×21): 20 mg via ORAL
  Filled 2023-08-26 (×21): qty 2

## 2023-08-26 MED ORDER — SODIUM BICARBONATE 650 MG PO TABS
650.0000 mg | ORAL_TABLET | Freq: Two times a day (BID) | ORAL | Status: DC
  Administered 2023-08-26 – 2023-09-01 (×13): 650 mg via ORAL
  Filled 2023-08-26 (×13): qty 1

## 2023-08-26 NOTE — Progress Notes (Signed)
 Physical Therapy Treatment Patient Details Name: Seth Burch MRN: [id number] DOB: 12/08/1955 Today's Date: 08/26/2023   History of Present Illness Pt is a 68 y/o male presenting 4/1 due to acute onset of R chest pain after eating. Pt noted with AKI. 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 4/2 R VATS, EGD, esophageal stent placement, and chest tube placement. 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 PMHx: HTN, DM    PT Comments  Patient overall mobilizing well. Continues to require assist with supine to sit, however otherwise is at a supervision level. VSS on RA. Will progress to stair training next visit.     If plan is discharge home, recommend the following: Assistance with cooking/housework;Assist for transportation;Help with stairs or ramp for entrance;A little help with walking and/or transfers;A little help with bathing/dressing/bathroom   Can travel by private vehicle        Equipment Recommendations  None recommended by PT    Recommendations for Other Services       Precautions / Restrictions Precautions Precautions: Fall;Other (comment) Precaution/Restrictions Comments: LUE jejunostomy, R chest tube Restrictions Weight Bearing Restrictions Per Provider Order: No     Mobility  Bed Mobility Overal bed mobility: Needs Assistance Bed Mobility: Supine to Sit, Sit to Supine     Supine to sit: Min assist, HOB elevated Sit to supine: Contact guard assist   General bed mobility comments: HHA to raise torso    Transfers Overall transfer level: Needs assistance Equipment used: Rolling walker (2 wheels) Transfers: Sit to/from Stand Sit to Stand: Supervision           General transfer comment: for safety/lines    Ambulation/Gait Ambulation/Gait assistance:  Supervision Gait Distance (Feet): 370 Feet Assistive device: Rolling walker (2 wheels) Gait Pattern/deviations: Step-through pattern, Decreased stride length Gait velocity: decr     General Gait Details: steady steps with decreased stride length.   Stairs             Wheelchair Mobility     Tilt Bed    Modified Rankin (Stroke Patients Only)       Balance Overall balance assessment: Needs assistance Sitting-balance support: No upper extremity supported, Feet supported Sitting balance-Leahy Scale: Good     Standing balance support: Reliant on assistive device for balance Standing balance-Leahy Scale: Fair Standing balance comment: able to stand statically with no AD, RW during gait                            Communication Communication Communication: No apparent difficulties  Cognition Arousal: Alert Behavior During Therapy: WFL for tasks assessed/performed, Anxious   PT - Cognitive impairments: No apparent impairments                         Following commands: Intact      Cueing Cueing Techniques: Verbal cues  Exercises      General Comments        Pertinent Vitals/Pain Pain Assessment Pain Assessment: Faces Faces Pain Scale: Hurts a little bit Pain Location: R chest tube site Pain Descriptors / Indicators: Discomfort Pain Intervention(s): Limited activity within patient's tolerance, Premedicated before session    Home Living  Prior Function            PT Goals (current goals can now be found in the care plan section) Acute Rehab PT Goals Patient Stated Goal: to need less assistance with mobility Time For Goal Achievement: 09/04/23 Potential to Achieve Goals: Good Progress towards PT goals: Progressing toward goals    Frequency    Min 2X/week      PT Plan      Co-evaluation              AM-PAC PT "6 Clicks" Mobility   Outcome Measure  Help needed turning from  your back to your side while in a flat bed without using bedrails?: A Little Help needed moving from lying on your back to sitting on the side of a flat bed without using bedrails?: A Little Help needed moving to and from a bed to a chair (including a wheelchair)?: A Little Help needed standing up from a chair using your arms (e.g., wheelchair or bedside chair)?: A Little Help needed to walk in hospital room?: A Little Help needed climbing 3-5 steps with a railing? : A Little 6 Click Score: 18    End of Session   Activity Tolerance: Patient tolerated treatment well Patient left: with call bell/phone within reach;in bed;with family/visitor present Nurse Communication: Mobility status PT Visit Diagnosis: Other abnormalities of gait and mobility (R26.89);Unsteadiness on feet (R26.81)     Time: 4098-1191 PT Time Calculation (min) (ACUTE ONLY): 19 min  Charges:    $Gait Training: 8-22 mins PT General Charges $$ ACUTE PT VISIT: 1 Visit                      Jerolyn Center, PT Acute Rehabilitation Services  Office [phone]    Zena Amos 08/26/2023, 12:54 PM

## 2023-08-26 NOTE — Progress Notes (Signed)
      [address]       [name] [address]             [phone]      6 Days Post-Op Procedure(s) (LRB): VIDEO ASSISTED THORACOSCOPY (VATS)/DECORTICATION (Right) EGD (ESOPHAGOGASTRODUODENOSCOPY) (N/A)  Subjective:  Patient is doing okay.  He states that he got a lot of gaseous discomfort overnight and currently tube feedings are being held.  He states there continues to be drainage around his J tube despite air being placed into balloon.  He is not yet moving his bowels.  Objective: Vital signs in last 24 hours: Temp:  [97.9 F (36.6 C)-99 F (37.2 C)] 98.5 F (36.9 C) (04/08 0813) Pulse Rate:  [76-101] 86 (04/08 0400) Cardiac Rhythm: Normal sinus rhythm;Sinus tachycardia (04/07 2000) Resp:  [10-30] 10 (04/08 0400) BP: (91-138)/(57-87) 138/77 (04/08 0300) SpO2:  [89 %-97 %] 93 % (04/08 0400)  Intake/Output from previous day: 04/07 0701 - 04/08 0700 In: 1208.9 [P.O.:240; Blood:315; IV Piggyback:553.9] Out: 910 [Urine:700; Chest Tube:210]  General appearance: alert, cooperative, and no distress Heart: regular rate and rhythm Lungs: diminished breath sounds bilaterally Abdomen: soft non-tender, bilious drainage around J Tube Extremities: extremities normal, atraumatic, no cyanosis or edema Wound: clean and dry  Lab Results: Recent Labs    08/25/23 0343 08/26/23 0255  WBC 4.5 7.4  HGB 6.9* 8.6*  HCT 21.6* 26.2*  PLT 316 341   BMET:  Recent Labs    08/25/23 0343 08/26/23 0255  NA 133* 133*  K 3.6 4.0  CL 104 104  CO2 19* 18*  GLUCOSE 145* 203*  BUN 55* 51*  CREATININE 1.92* 1.87*  CALCIUM 7.8* 8.0*    PT/INR: No results for input(s): "LABPROT", "INR" in the last 72 hours. ABG    Component Value Date/Time   PHART 7.4 07/29/2023 0514   HCO3 25.8 08/02/2023 2006   TCO2 17 (L) 08/19/2023 2022   ACIDBASEDEF 1.0 08/02/2023 1506   O2SAT 94.5 08/02/2023 2006   CBG (last 3)  Recent Labs    08/25/23 2326 08/26/23 0331 08/26/23 0804   GLUCAP 178* 193* 159*    Assessment/Plan: S/P Procedure(s) (LRB): VIDEO ASSISTED THORACOSCOPY (VATS)/DECORTICATION (Right) EGD (ESOPHAGOGASTRODUODENOSCOPY) (N/A)  CV- NSR, mild tachycardia at times- BP is controlled-continue lopressor, hydralazine prn Pulm- CT output 210 cc milky output.Marland Kitchenlooks like tube feedings vs. Chylous drainage.. will discuss with Dr. Cliffton Asters Renal-H/O Stage 3 CKD, remains stable.. creatinine at 1.87 GI- remains on tube feedings.. have to stop off and on due to gas/drainage from J tube.. remains on dysphagia 1 diet ID-remains afebrile, no leukocytosis.. OR cultures showing Lactobacillus, continue Unasyn and Diflucan DM-sugars slightly elevated at times, continue SSIP monitor Deconditioning- continue PT/OT   LOS: 7 days    [name], PA-C 08/26/2023

## 2023-08-26 NOTE — Plan of Care (Signed)

## 2023-08-26 NOTE — Progress Notes (Signed)
 Occupational Therapy Treatment Patient Details Name: Seth Burch MRN: [id number] DOB: [birth year] Today's Date: 08/26/2023   History of present illness Pt is a 68 y/o male presenting 4/1 due to acute onset of R chest pain after eating. Pt noted with AKI. 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 4/2 R VATS, EGD, esophageal stent placement, and chest tube placement. 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 PMHx: HTN, DM   OT comments  Patient with good progress toward patient focused goals.  Patient essentially supervision at [facility] for in room mobility.  Min A for bed mobility and setup for stand grooming.  OT will continue efforts, and HH OT can be considered, but may not be needed.        If plan is discharge home, recommend the following:  A little help with walking and/or transfers;A little help with bathing/dressing/bathroom;Assistance with cooking/housework;Assist for transportation;Help with stairs or ramp for entrance   Equipment Recommendations  None recommended by OT    Recommendations for Other Services      Precautions / Restrictions Precautions Precautions: Fall;Other (comment) Precaution/Restrictions Comments: LUE jejunostomy, R chest tube Restrictions Weight Bearing Restrictions Per Provider Order: No       Mobility Bed Mobility   Bed Mobility: Sidelying to Sit, Sit to Sidelying   Sidelying to sit: Min assist, HOB elevated     Sit to sidelying: Min assist      Transfers Overall transfer level: Needs assistance Equipment used: Rolling walker (2 wheels) Transfers: Sit to/from Stand, Bed to chair/wheelchair/BSC Sit to Stand: Supervision     Step pivot transfers: Supervision           Balance Overall balance assessment: Needs assistance Sitting-balance support: No  upper extremity supported, Feet supported Sitting balance-Leahy Scale: Good     Standing balance support: Reliant on assistive device for balance Standing balance-Leahy Scale: Fair                             ADL either performed or assessed with clinical judgement   ADL       Grooming: Supervision/safety;Standing               Lower Body Dressing: Supervision/safety;Sit to/from stand   Toilet Transfer: Supervision/safety;Regular Toilet;Rolling walker (2 wheels);Ambulation                  Extremity/Trunk Assessment Upper Extremity Assessment Upper Extremity Assessment: Overall WFL for tasks assessed   Lower Extremity Assessment Lower Extremity Assessment: Defer to PT evaluation   Cervical / Trunk Assessment Cervical / Trunk Assessment: Normal    Vision Patient Visual Report: No change from baseline     Perception Perception Perception: Not tested   Praxis Praxis Praxis: Not tested   Communication Communication Communication: No apparent difficulties   Cognition Arousal: Alert Behavior During Therapy: WFL for tasks assessed/performed, Anxious Cognition: No apparent impairments                               Following commands: Intact        Cueing      Exercises      Shoulder Instructions       General Comments      Pertinent Vitals/ Pain       Pain  Assessment Faces Pain Scale: No hurt Pain Intervention(s): Monitored during session                                                          Frequency  Min 2X/week        Progress Toward Goals  OT Goals(current goals can now be found in the care plan section)  Progress towards OT goals: Progressing toward goals  Acute Rehab OT Goals OT Goal Formulation: With patient Time For Goal Achievement: 09/04/23 Potential to Achieve Goals: Good  Plan      Co-evaluation                 AM-PAC OT "6 Clicks" Daily Activity      Outcome Measure   Help from another person eating meals?: None Help from another person taking care of personal grooming?: A Little Help from another person toileting, which includes using toliet, bedpan, or urinal?: A Little Help from another person bathing (including washing, rinsing, drying)?: A Little Help from another person to put on and taking off regular upper body clothing?: None Help from another person to put on and taking off regular lower body clothing?: A Little 6 Click Score: 20    End of Session Equipment Utilized During Treatment: Rolling walker (2 wheels)  OT Visit Diagnosis: Unsteadiness on feet (R26.81);Other abnormalities of gait and mobility (R26.89)   Activity Tolerance Patient tolerated treatment well   Patient Left in bed;with call bell/phone within reach   Nurse Communication Mobility status        Time: 4098-1191 OT Time Calculation (min): 23 min  Charges: OT General Charges $OT Visit: 1 Visit OT Treatments $Self Care/Home Management : 23-37 mins  08/26/2023  RP, OTR/L  Acute Rehabilitation Services  Office:  [phone]   Suzanna Obey 08/26/2023, 8:39 AM

## 2023-08-26 NOTE — Progress Notes (Signed)
 Brief Nutrition Follow-up:  Pt reports feeling ok today; reports he ate 100% of puree eggs and some of the puree waffle this AM. He said he didn't like the food but he ate it anyway. Pt drank 1 Premier Protein shake yesterday; again, pt states he does not like the shake but drank it anyway; pt does state that Premier Protein is more palatable than the other shakes.  Pt previously mostly eating applesauce.   TF currently on hold; held overnight due to abdominal pressure/discomfort and significant leakage  Pt reports feeling full of gas but not passing flatus; pt reports "the gas is relieved by the leakage around J-tube site. No BM today, type 6 medium BM yesterday  Noted J-tube balloon re-inflated by Dr. Cliffton Asters yesterday but leakage persisted overnight. Pt reports dressing was changed at least 5 times. Noted dressing saturated on visit today, notified RN    210 mL output from pleural chest tube-milky white, surgery questioning TF vs chylous appearance  Noted I&D consulted, pleural cultures +lactobacillus  Paged [name] PA to discuss poc; [name] indicates she plans to talk to Dr. Cliffton Asters once he is out of an emergency surgery.   Pt currently not meeting nutritional needs.  -Recommend supplemental TPN to meet nutrition needs  -Calorie Count to assess oral intake to better establish goal TF rate to meet nutritional needs  -Continue Premier Protein shakes as tolerated, brought in by family. Each shake has 30 g of protein but only 160 calories.    Romelle Starcher MS, RDN, LDN, CNSC Registered Dietitian 3 Clinical Nutrition RD Inpatient Contact Info in Amion

## 2023-08-26 NOTE — Progress Notes (Signed)
 NAME:  Seth Burch, MRN:  [id number], DOB:  Dec 12, 1955, LOS: 7 ADMISSION DATE:  08/19/2023, CONSULTATION DATE:  4/7 REFERRING MD:  lightfoot, CHIEF COMPLAINT:  post-op care    History of Present Illness:  68 year old male patient who is status post esophagectomy on 07/28/2023 Discharged after 22 days with complicated course due to aspiration pneumonia, respiratory failure, AKI and delirium.  Follow-up swallowing studies were negative for leaks completed antibiotics on 3/27 had been discharged on 4/1 was eating dinner that evening and developed sudden onset of right sided chest discomfort.  He reported to the emergency room at this time showed new tachycardia, chest x-ray with increased opacity at right base white blood cell count 15.7 up from 12,000 and a lactate of 2.8 he was started on IV fluids, administered IV antibiotics, and thoracic surgery was consulted.  Diagnostic imaging was consistent with esophageal leak at the anastomosis site with associated pleural effusion he therefore went to the operating room for video-assisted thoracoscopy with decortication, as well as esophageal stent placement on 4/2.  Postoperative course fairly unremarkable, recovering in the intensive care, receiving tube feeds via J-tube, and dysphagia 1 diet was started.  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  He is status post radiation and chemotherapy January 2025 follow-up PET scan showed substantial improvement in hypermetabolic activity.  Just discharged following robotic assisted esophagectomy and placement of jejunostomy tube 3/10  we could check a left now I do not want to nSignificant Hospital Events: Including procedures, antibiotic start and stop dates in addition  to other pertinent events   Discharged post esophagectomy, readmitted later that day.Initially started on vancomycin and cefepime,  4/2, VATS with decortication and esophageal stent placement  4/3 changed to fluconazole and levofloxacin on  4/4 through 4/6: Had some J-tube leakage.Tolerating dysphagia 1 diet 4/7 pulmonary/critical care asked to evaluate and assist with ICU care culturesFrom just growing moderate lactobacillus species and few [place] MUCILAGINOSA, change antibiotics to Unasyn  Interim History / Subjective:   Reports his pain is 5 out of 10 but controlled with medicine.  No new shortness of breath.  Reports some abdominal discomfort frustration still with leaking feeding tube Objective   Blood pressure 138/77, pulse 86, temperature 98.5 F (36.9 C), temperature source Oral, resp. rate 10, height 5\' 10"  (1.778 m), weight 78.5 kg, SpO2 93%.        Intake/Output Summary (Last 24 hours) at 08/26/2023 0942 Last data filed at 08/26/2023 0400 Gross per 24 hour  Intake 868.9 ml  Output 890 ml  Net -21.1 ml   Filed Weights   08/20/23 2234 08/23/23 1524 08/25/23 0600  Weight: 80.6 kg 81.3 kg 78.5 kg    Examination: General Pleasant 68 year old white male lying in bed in no acute distress HEENT normocephalic atraumatic no jugular venous distention patient Pulmonary: Clear, currently room air.  Right chest tube has put out a total of 140 mL over the last 12 hours still milky appearing secretions no airleak noted on the [place] Cardiac regular rate and rhythm Abdomen: Soft.  Denies tenderness.  Extremity dressing over the J-tube.  J-tube feedings have been on hold due to leaking had a mild discomfort earlier Ext warm dry Neuro intact  Resolved Hospital Problem list     Assessment &  Plan:  Esophageal leak complicated by right sided empyema now status post video-assisted thoracotomy with decortication and esophageal stent Plan Day #7 antibiotics, changed to Unasyn today on 4/7,  continue fluconazole Follow-up culture sensitivities Chest tube management per thoracic surgery Dysphagia 1 diet Advance tube feeds per surgical team Trend CBC Mobilize As needed analgesia  J tube leak Plan Balloon inflated earlier by surgical team  Defer to them re: next step   Recurrent AKI with mild nonanion gap metabolic acidosis.  Serum creatinine little better today, but his NAGMA a little worse ? GI loss?  Plan Keep euvolemic Renal dose medications as indicated Strict intake and output A.m. chemistry Add bicarb oral   Acute blood loss anemia.  Expected postsurgery.  No active bleeding currently. Got blood 4/7 hgb 6.9 to 8.6 Plan Am cbc Trigger for transfusion < 7   History of diabetes with hyperglycemia Plan Sliding scale insulin Goal 140-180  History of hypertension Plan Continuing his Lopressor 12.5 mg twice daily Holding his Norvasc currently (BP OK)  History of hyperlipidemia Plan Resume lipitor   BPH Plan Continue Flomax  Esophageal cancer status post esophagectomy as well as radiation and chemo Plan Follow-up with heme-onc/surgery as indicated  Best Practice (right click and "Reselect all SmartList Selections" daily)   Diet/type: dysphagia diet (see orders) DVT prophylaxis SCD Pressure ulcer(s): N/A GI prophylaxis: PPI Lines: N/A Foley:  N/A Code Status:  full code Last date of multidisciplinary goals of care discussion [per primary ]  Critical care time: NA    My time 32 min

## 2023-08-27 ENCOUNTER — Inpatient Hospital Stay: Payer: 59 | Admitting: Oncology

## 2023-08-27 ENCOUNTER — Inpatient Hospital Stay (HOSPITAL_COMMUNITY)

## 2023-08-27 ENCOUNTER — Inpatient Hospital Stay: Payer: 59 | Attending: Oncology

## 2023-08-27 DIAGNOSIS — E43 Unspecified severe protein-calorie malnutrition: Secondary | ICD-10-CM | POA: Diagnosis not present

## 2023-08-27 DIAGNOSIS — I10 Essential (primary) hypertension: Secondary | ICD-10-CM

## 2023-08-27 DIAGNOSIS — K9413 Enterostomy malfunction: Secondary | ICD-10-CM | POA: Diagnosis not present

## 2023-08-27 DIAGNOSIS — K9189 Other postprocedural complications and disorders of digestive system: Secondary | ICD-10-CM | POA: Diagnosis not present

## 2023-08-27 DIAGNOSIS — N179 Acute kidney failure, unspecified: Secondary | ICD-10-CM | POA: Diagnosis not present

## 2023-08-27 DIAGNOSIS — N1831 Chronic kidney disease, stage 3a: Secondary | ICD-10-CM

## 2023-08-27 DIAGNOSIS — E1165 Type 2 diabetes mellitus with hyperglycemia: Secondary | ICD-10-CM

## 2023-08-27 DIAGNOSIS — N400 Enlarged prostate without lower urinary tract symptoms: Secondary | ICD-10-CM

## 2023-08-27 HISTORY — PX: IR REPLC GASTRO/COLONIC TUBE PERCUT W/FLUORO: IMG2333

## 2023-08-27 LAB — GLUCOSE, CAPILLARY
Glucose-Capillary: 101 mg/dL — ABNORMAL HIGH (ref 70–99)
Glucose-Capillary: 115 mg/dL — ABNORMAL HIGH (ref 70–99)
Glucose-Capillary: 115 mg/dL — ABNORMAL HIGH (ref 70–99)
Glucose-Capillary: 136 mg/dL — ABNORMAL HIGH (ref 70–99)
Glucose-Capillary: 121 mg/dL — ABNORMAL HIGH (ref 70–99)
Glucose-Capillary: 127 mg/dL — ABNORMAL HIGH (ref 70–99)

## 2023-08-27 LAB — CBC
HCT: 29 % — ABNORMAL LOW (ref 39.0–52.0)
Hemoglobin: 9.5 g/dL — ABNORMAL LOW (ref 13.0–17.0)
MCH: 30.4 pg (ref 26.0–34.0)
MCHC: 32.8 g/dL (ref 30.0–36.0)
MCV: 92.7 fL (ref 80.0–100.0)
Platelets: 381 10*3/uL (ref 150–400)
RBC: 3.13 MIL/uL — ABNORMAL LOW (ref 4.22–5.81)
RDW: 16 % — ABNORMAL HIGH (ref 11.5–15.5)
WBC: 10 10*3/uL (ref 4.0–10.5)
nRBC: 0 % (ref 0.0–0.2)

## 2023-08-27 LAB — COMPREHENSIVE METABOLIC PANEL WITH GFR
ALT: 20 U/L (ref 0–44)
AST: 23 U/L (ref 15–41)
Albumin: 1.7 g/dL — ABNORMAL LOW (ref 3.5–5.0)
Alkaline Phosphatase: 209 U/L — ABNORMAL HIGH (ref 38–126)
Anion gap: 11 (ref 5–15)
BUN: 38 mg/dL — ABNORMAL HIGH (ref 8–23)
CO2: 19 mmol/L — ABNORMAL LOW (ref 22–32)
Calcium: 7.9 mg/dL — ABNORMAL LOW (ref 8.9–10.3)
Chloride: 104 mmol/L (ref 98–111)
Creatinine, Ser: 1.75 mg/dL — ABNORMAL HIGH (ref 0.61–1.24)
GFR, Estimated: 42 mL/min — ABNORMAL LOW (ref >60)
Glucose, Bld: 110 mg/dL — ABNORMAL HIGH (ref 70–99)
Potassium: 4 mmol/L (ref 3.5–5.1)
Sodium: 134 mmol/L — ABNORMAL LOW (ref 135–145)
Total Bilirubin: 0.6 mg/dL (ref 0.0–1.2)
Total Protein: 5.5 g/dL — ABNORMAL LOW (ref 6.5–8.1)

## 2023-08-27 NOTE — Progress Notes (Signed)
     [address]       [place] [address]             [phone]       No events  Vitals:   08/27/23 0500 08/27/23 0600  BP: 132/72 122/70  Pulse: 96 (!) 101  Resp: 20 14  Temp:    SpO2: 93% 92%   Alert NAD Nontoxic Abd soft  S/p MIE now with anastomotic leak Will attempt exchanging tube with IR Continue abx Dispo planning  Phuong Moffatt O Everett Ehrler

## 2023-08-27 NOTE — Progress Notes (Signed)
 NAME:  Seth Burch, MRN:  [id number], DOB:  27-Jan-1956, LOS: 8 ADMISSION DATE:  08/19/2023, CONSULTATION DATE:  4/7 REFERRING MD:  lightfoot, CHIEF COMPLAINT:  post-op care    History of Present Illness:  68 year old male patient who is status post esophagectomy on 07/28/2023 Discharged after 22 days with complicated course due to aspiration pneumonia, respiratory failure, AKI and delirium.  Follow-up swallowing studies were negative for leaks completed antibiotics on 3/27 had been discharged on 4/1 was eating dinner that evening and developed sudden onset of right sided chest discomfort.  He reported to the emergency room at this time showed new tachycardia, chest x-ray with increased opacity at right base white blood cell count 15.7 up from 12,000 and a lactate of 2.8 he was started on IV fluids, administered IV antibiotics, and thoracic surgery was consulted.  Diagnostic imaging was consistent with esophageal leak at the anastomosis site with associated pleural effusion he therefore went to the operating room for video-assisted thoracoscopy with decortication, as well as esophageal stent placement on 4/2.  Postoperative course fairly unremarkable, recovering in the intensive care, receiving tube feeds via J-tube, and dysphagia 1 diet was started.  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  He is status post radiation and chemotherapy January 2025 follow-up PET scan showed substantial improvement in hypermetabolic activity.  Just discharged following robotic assisted esophagectomy and placement of jejunostomy tube 3/10  we could check a left now I do not want to nSignificant Hospital Events: Including procedures, antibiotic start and stop dates in addition  to other pertinent events   Discharged post esophagectomy, readmitted later that day.Initially started on vancomycin and cefepime,  4/2, VATS with decortication and esophageal stent placement  4/3 changed to fluconazole and levofloxacin on  4/4 through 4/6: Had some J-tube leakage.Tolerating dysphagia 1 diet 4/7 pulmonary/critical care asked to evaluate and assist with ICU care culturesFrom just growing moderate lactobacillus species and few Rothia MUCILAGINOSA, change antibiotics to Unasyn  Interim History / Subjective:  No overnight events, states pain is controlled. He would like to get his leaking J tube resolved  Objective   Blood pressure 122/70, pulse (!) 101, temperature 98.8 F (37.1 C), temperature source Oral, resp. rate 14, height 5\' 10"  (1.778 m), weight 78.5 kg, SpO2 92%.        Intake/Output Summary (Last 24 hours) at 08/27/2023 0733 Last data filed at 08/27/2023 0600 Gross per 24 hour  Intake 840.13 ml  Output 2495 ml  Net -1654.87 ml   Filed Weights   08/20/23 2234 08/23/23 1524 08/25/23 0600  Weight: 80.6 kg 81.3 kg 78.5 kg    General:  well nourished M, resting in bed in NAD  HEENT: MM pink/moist Neuro: alert and oriented and moving all extremities  CV: s1s2 rrr, no m/r/g PULM:  R chest tube with serous/milky appearing drainage 230cc in last 24hrs GI: soft, bsx4 active, J tube with dry dressing in place, no abdominal TTP Extremities: warm/dry, no edema  Skin: no rashes or lesions   Labs: Glu 101, WBC 10k, Hgb 9.5, Na 134, Creatinine 1.75   Micro: 4/2 wound cx>lactobacillus, rothia 4/1 BC 1/2> lactobacillius   Tubes/lines/drains  R chest port  L tube R chest tube 4/3  I/O: 2.1L UOP yesterday 230cc CT  output    Resolved Hospital Problem list     Assessment & Plan:   Esophageal leak complicated by right sided empyema now status post video-assisted thoracotomy with decortication and esophageal stent Abx initiated 4/1 with Vanc and Zosyn,  Unasyn 4/7, Fluconazole 4/3 -Chest tube management per thoracic surgery -continue Dysphagia 1 diet -Advance tube feeds per surgical team -continue Unasyn and Fluconazole, discuss end date -encourage mobilization, PT has been consulted  -continue prn Dilaudid, Tylenol and Oxy  J tube leak -not improved after balloon was re-inflated yesterday -plan to exchange tube with IR per TCTS   Recurrent AKI with mild nonanion gap metabolic acidosis. Creatinine stable at 1.75 with 2L UOP yesterday, suspect creatinine is lagging behind renal recovery -continue to monitor I/O -Renally dose medications as indicated -monitor renal indices and avoid nephrotoxins -cont oral bicarb   Acute blood loss anemia. -stable, continue to monitor, transfuse for Hgb <7   History of diabetes with hyperglycemia -Sliding scale insulin -Goal 140-180  History of hypertension -Continuing his Lopressor 12.5 mg twice daily -Holding his Norvasc currently (BP OK)  History of hyperlipidemia -continue lipitor   BPH -Continue Flomax  Esophageal cancer status post esophagectomy as well as radiation and chemo -Follow-up with heme-onc/surgery as indicated  Best Practice (right click and "Reselect all SmartList Selections" daily)   Diet/type: dysphagia diet (see orders), TF held DVT prophylaxis SCD Pressure ulcer(s): N/A GI prophylaxis: PPI Lines: N/A Foley:  N/A Code Status:  full code Last date of multidisciplinary goals of care discussion [per primary ]  Critical care time: NA    Darcella Gasman Tsuneo Faison, PA-C [facility] See Amion for pager If no response to pager , please call [phone] until 7pm After 7:00 pm call Elink  [phone]

## 2023-08-28 ENCOUNTER — Other Ambulatory Visit: Payer: 59

## 2023-08-28 ENCOUNTER — Inpatient Hospital Stay (HOSPITAL_COMMUNITY)

## 2023-08-28 ENCOUNTER — Ambulatory Visit: Payer: 59 | Admitting: Oncology

## 2023-08-28 DIAGNOSIS — N179 Acute kidney failure, unspecified: Secondary | ICD-10-CM | POA: Diagnosis not present

## 2023-08-28 DIAGNOSIS — E43 Unspecified severe protein-calorie malnutrition: Secondary | ICD-10-CM | POA: Diagnosis not present

## 2023-08-28 DIAGNOSIS — K9413 Enterostomy malfunction: Secondary | ICD-10-CM | POA: Diagnosis not present

## 2023-08-28 DIAGNOSIS — K9189 Other postprocedural complications and disorders of digestive system: Secondary | ICD-10-CM | POA: Diagnosis not present

## 2023-08-28 LAB — CBC WITH DIFFERENTIAL/PLATELET
Abs Immature Granulocytes: 0.43 10*3/uL — ABNORMAL HIGH (ref 0.00–0.07)
Basophils Absolute: 0 10*3/uL (ref 0.0–0.1)
Basophils Relative: 0 %
Eosinophils Absolute: 0.1 10*3/uL (ref 0.0–0.5)
Eosinophils Relative: 1 %
HCT: 28.2 % — ABNORMAL LOW (ref 39.0–52.0)
Hemoglobin: 9.3 g/dL — ABNORMAL LOW (ref 13.0–17.0)
Immature Granulocytes: 5 %
Lymphocytes Relative: 8 %
Lymphs Abs: 0.8 10*3/uL (ref 0.7–4.0)
MCH: 30.4 pg (ref 26.0–34.0)
MCHC: 33 g/dL (ref 30.0–36.0)
MCV: 92.2 fL (ref 80.0–100.0)
Monocytes Absolute: 1 10*3/uL (ref 0.1–1.0)
Monocytes Relative: 11 %
Neutro Abs: 7 10*3/uL (ref 1.7–7.7)
Neutrophils Relative %: 75 %
Platelets: 387 10*3/uL (ref 150–400)
RBC: 3.06 MIL/uL — ABNORMAL LOW (ref 4.22–5.81)
RDW: 15.9 % — ABNORMAL HIGH (ref 11.5–15.5)
WBC: 9.3 10*3/uL (ref 4.0–10.5)
nRBC: 0 % (ref 0.0–0.2)

## 2023-08-28 LAB — BASIC METABOLIC PANEL WITH GFR
Anion gap: 11 (ref 5–15)
BUN: 26 mg/dL — ABNORMAL HIGH (ref 8–23)
CO2: 22 mmol/L (ref 22–32)
Calcium: 7.9 mg/dL — ABNORMAL LOW (ref 8.9–10.3)
Chloride: 103 mmol/L (ref 98–111)
Creatinine, Ser: 1.62 mg/dL — ABNORMAL HIGH (ref 0.61–1.24)
GFR, Estimated: 47 mL/min — ABNORMAL LOW (ref >60)
Glucose, Bld: 145 mg/dL — ABNORMAL HIGH (ref 70–99)
Potassium: 3.9 mmol/L (ref 3.5–5.1)
Sodium: 136 mmol/L (ref 135–145)

## 2023-08-28 LAB — GLUCOSE, CAPILLARY
Glucose-Capillary: 138 mg/dL — ABNORMAL HIGH (ref 70–99)
Glucose-Capillary: 139 mg/dL — ABNORMAL HIGH (ref 70–99)
Glucose-Capillary: 146 mg/dL — ABNORMAL HIGH (ref 70–99)
Glucose-Capillary: 153 mg/dL — ABNORMAL HIGH (ref 70–99)
Glucose-Capillary: 167 mg/dL — ABNORMAL HIGH (ref 70–99)
Glucose-Capillary: 170 mg/dL — ABNORMAL HIGH (ref 70–99)

## 2023-08-28 MED ORDER — ZINC OXIDE 40 % EX OINT
TOPICAL_OINTMENT | Freq: Three times a day (TID) | CUTANEOUS | Status: DC
  Administered 2023-08-29 – 2023-09-08 (×3): 1 via TOPICAL
  Filled 2023-08-28 (×3): qty 57

## 2023-08-28 NOTE — Progress Notes (Signed)
 Interventional Radiology Brief Note:  IR has been consulted for assessment of a leaking J-tube for Seth Burch. Patient with ongoing persistent leaking of thin bilious fluid from his tube insertion site despite upsize to an 32F J-tube with elongated tube terminating in the distal small bowel.  DG Abd this AM confirms placement remains in the distal small bowel.    At assessment this afternoon.  Patient is resting reclined in bed with NO TF infusing.  Despite bowel rest, he has ongoing constant drainage of bile around his site and left flank which has become excoriated. This is presumably from the proximal small bowel as it empties towards the insertion site of the tube.  A thin dressing reapplied, however anticipate frequent dressing changes will be needed to adequately wick bile from his skin to prevent further breakdown. RN using barrier cream.   Reached out to RD for ongoing assistance with maximizing nutrition for wound healing. Encouraged PO intake with patient.   Will plan to consent Seth Burch for another exchange with upsize tomorrow per Dr. Milford Cage after additional review  of case by IR attending 4/11, Dr. [name].   Orders in place.  RN aware.   Loyce Dys, MS RD PA-C

## 2023-08-28 NOTE — Progress Notes (Addendum)
      [address]       [name] [address]             [phone]      8 Days Post-Op Procedure(s) (LRB): VIDEO ASSISTED THORACOSCOPY (VATS)/DECORTICATION (Right) EGD (ESOPHAGOGASTRODUODENOSCOPY) (N/A)  Subjective:  Patient states he continues to have abdominal bloating with tube feeds.  He is not passing gas, but states he has passed liquid stool.  He is also concerned that despite new j tube being placed it continues to leak.  Denies N/V.  Objective: Vital signs in last 24 hours: Temp:  [97.9 F (36.6 C)-99.1 F (37.3 C)] 98.2 F (36.8 C) (04/10 0700) Pulse Rate:  [87-101] 92 (04/10 0700) Cardiac Rhythm: Normal sinus rhythm (04/09 2000) Resp:  [14-31] 19 (04/10 0700) BP: (111-156)/(60-83) 115/60 (04/10 0700) SpO2:  [90 %-94 %] 91 % (04/10 0700)  Intake/Output from previous day: 04/09 0701 - 04/10 0700 In: 466.4 [IV Piggyback:466.4] Out: 1555 [Urine:1525; Chest Tube:30]  General appearance: alert, cooperative, and no distress Heart: regular rate and rhythm Lungs: clear to auscultation bilaterally Abdomen: soft, non-tender; bowel sounds normal; no masses,  no organomegaly Extremities: extremities normal, atraumatic, no cyanosis or edema Wound: J tube with green purulent type drainage, skin is red and macerated  Lab Results: Recent Labs    08/27/23 0252 08/28/23 0646  WBC 10.0 9.3  HGB 9.5* 9.3*  HCT 29.0* 28.2*  PLT 381 387   BMET:  Recent Labs    08/26/23 0255 08/27/23 0252  NA 133* 134*  K 4.0 4.0  CL 104 104  CO2 18* 19*  GLUCOSE 203* 110*  BUN 51* 38*  CREATININE 1.87* 1.75*  CALCIUM 8.0* 7.9*    PT/INR: No results for input(s): "LABPROT", "INR" in the last 72 hours. ABG    Component Value Date/Time   PHART 7.4 07/29/2023 0514   HCO3 25.8 08/02/2023 2006   TCO2 17 (L) 08/19/2023 2022   ACIDBASEDEF 1.0 08/02/2023 1506   O2SAT 94.5 08/02/2023 2006   CBG (last 3)  Recent Labs    08/27/23 2314 08/28/23 0332  08/28/23 0743  GLUCAP 127* 146* 167*    Assessment/Plan: S/P Procedure(s) (LRB): VIDEO ASSISTED THORACOSCOPY (VATS)/DECORTICATION (Right) EGD (ESOPHAGOGASTRODUODENOSCOPY) (N/A)  CV- NSR, BP stable on hydralazine Pulm- CT drainage is slightly less milky today, 30 cc recorded yesterday.. leave in place Renal-H/O CKD Stage 3, creatinine remains stable GI- continue J tube feedings as tolerated, abdominal exam w/o BS.. will check KUB, continue dysphagia diet, increase oral intake as able... esophageal stent in place for anastomotic leak DM- sugars well controlled, continue SSIP Deconditioning- PT/OT    LOS: 9 days    [name], PA-C 08/28/2023   Patient ABD film shows no evidence of stool burden.  There is gas pattern present.  J tube continues to have quite a bit of drainage around.  Dietitican called and is concern patient is not meeting any caloric needs and they think he would benefit from TPN.  He also may benefit from CT abdomen as patient having persistent bloating/discomfort.  Will discuss with Dr. Cliffton Asters.  [name], PA-C 1:59 PM 08/28/23

## 2023-08-28 NOTE — Progress Notes (Addendum)
 Nutrition Follow-up  DOCUMENTATION CODES:   Severe malnutrition in context of chronic illness  INTERVENTION:   -Recommend supplemental TPN to meet nutrition needs   -Continue Premier Protein shakes as tolerated, brought in by family. Each shake has 30g of protein but only 160 calories.    - Monitor diet advancement and tolerance  - Continue to encourage addition of patient purchased unflavored protein powder to low protein food items    NUTRITION DIAGNOSIS:  Severe Malnutrition related to chronic illness (esophageal cancer s/p chemo, radiation and surgery) as evidenced by severe fat depletion, severe muscle depletion, percent weight loss.- remains applicable  GOAL:  Patient will meet greater than or equal to 90% of their needs - not progressing  MONITOR:  PO intake, Diet advancement, TF tolerance  REASON FOR ASSESSMENT:  New TF    ASSESSMENT:  Pt recently discharged from [facility] after complicated admission (3/10-4/1) d/t adenocarcinoma of distal third esophagus s/p chemo and radiation and admitted for scheduled esophagectomy. Returned hours after discharge with c/o chest pain. PMH: DM, HTN, dyslipedemia, CKD, IgG kappa MGUS.  Previous Admission 3/10 EGD, XI Robotic Assisted Ivor Lewis Esophagectomy, J-tube placement 3/11 Trickle TF initiated 3/12 Surgery increased TF to 40 ml/hr 3/13 Surgery increased TF to 55 ml/hr, transferred to ICU  3/14 Surgery increased TF to 65 ml/hr (goal), bowel regimen added and +BM 3/15 Intubated, Bronch (Thick mucus in trachea suctioned, partially occluding L mainstem, ?aspiration, recurrent false vocal cord irritation with swelling leading to stridor and resp failure) 3/17 Extubated 3/18 Esophogram:  Brisk tracheal aspiration of ingested contrast, into the LEFT mainstem bronchus. SLP eval recommended. Stagnant contrast at diaphragmatic hiatus. No anastomotic leak.  3/19 Diet advanced to Dysphagia 2/Thins, later downgraded to Nectar Thick  Liquids 3/20 Transition to Nocturnal TF, family to bring protein shake in from home 3/21 Significant diarrhea overnight, (9 stools in 10 hours), significant J-tube drainage, change to Vivonex with day time feedings 3/24 Diet changed to Dysphagia 2, Thins per SLP 3/26 NPO due to concern for possible esophageal leak 3/27 advanced to Dysphagia 3, thin liquids  3/31 modify TF regimen for d/c 4/1 discharged home Current Admission 4/2 admitted; OR: R VATS, EGD, and esophageal stent placement 4/3 trial Molli Posey TF formula during the day (16 hours), transfusion 4/4 advanced to goal rate; leaking J-tube, TF held  4/5 txr to cardiovascular ICU 4/7 TF re-initiated, transfused  4/9 J-tube replaced by IR 4/10 significant leaking from J-tube; TFs on hold  Pt J-tube replaced by IR last evening around 5:30PM. Remains with significant amount of drainage from his J-tube site. Tube feed running at 43ml/hr during bedside visit today. Upon inspection of his site, dressing soaked in what appeared to be mixture of bilious output and tube feeding. Of note, dressing changed approximately 1.5 hours earlier. Tube feeds on hold and recommend continuing to hold as leakage and abdominal discomfort increase with resumption of tube feed, even at reduced rate.   Leak is impacting patient's ability to mobilize with therapy. Patient continues to report pressure/bloating in lower abdomen independent of tube feed administration, however it does improve somewhat when tube feed on hold. Abdominal scan today showing no acute findings.  Calorie count discontinued and incomplete as he was NPO most of the day yesterday pending IR procedure on J-tube.   Given patient is not sufficiently meeting needs via TF and/or orally, continue to recommend TPN to adequately and consistently meet estimated nutritional needs via artificial nutrition. Reviewed with patient who is amenable to  this. He has been unable to tolerate/absorb tube feed since  re-admission and is taking very little in by mouth per patient, family, and RN report.  Spoke to PA-C's from both surgery and critical care. CCM not amicable to this currently without approval from surgery. Spoke with surgery PA-C who was not able to give approval at this time. Will re-assess tomorrow after PA confers with surgery MD.    Admit Weight: 77.1kg Current Weight: 78.5kg  Intake/Output Summary (Last 24 hours) at 08/28/2023 1548 Last data filed at 08/28/2023 1300 Gross per 24 hour  Intake 796.59 ml  Output 1135 ml  Net -338.41 ml    Net IO Since Admission: -721.33 mL [08/28/23 1548]    Crt stabilizing. Hyponatremia improved with addition of sodium bicarbonate.   Drains/Lines: LUQ: Jejunostomy (16 Fr) R Lateral Pleural (19Fr): x24 hours UOP: 1.5L x24 hours   Meds: SSI 0-15 TID, pantoprazole, sodium bicarbonate, pantoprazole Drips: IV ABX   Labs: Na+ 133>134>136(wdl) K+ 3.9 (wdl) Hgb 6.9>8.6>9.5>9.3 (L) Crt 1.87>1.75>1.62 (H) CBGs 110-145 x24 hours A1c 7.1 (07/2023)  Diet Order:   Diet Order             DIET - DYS 1 Room service appropriate? Yes; Fluid consistency: Thin  Diet effective now             EDUCATION NEEDS:  Education needs have been addressed  Skin:  Incisions: R chest tube Other: J-tube  Last BM:  4/10 - type 7 x1  Height:  Ht Readings from Last 1 Encounters:  08/20/23 5\' 10"  (1.778 m)   Weight:  Wt Readings from Last 1 Encounters:  08/25/23 78.5 kg   Ideal Body Weight:     BMI:  Body mass index is 24.83 kg/m.  Estimated Nutritional Needs:   Kcal:  2200-2400kcal  Protein:  115-130g  Fluid:  >=2L/day  Myrtie Cruise MS, RD, LDN Registered Dietitian Clinical Nutrition RD Inpatient Contact Info in Amion

## 2023-08-28 NOTE — Progress Notes (Addendum)
 PT Cancellation Note  Patient Details Name: DERIN GRANQUIST MRN: [id number] DOB: 12-02-1955   Cancelled Treatment:    Reason Eval/Treat Not Completed: Patient at procedure or test/unavailable  1246-RN dressing J-tube and pt wants to eat his lunch that has arrived. Agreed to PT returning later today, as schedule permits.  1535-Patient just back to bed with nursing assist. RN confirms. Will attempt 4/11   Jerolyn Center, PT Acute Rehabilitation Services  Office [phone]  Zena Amos 08/28/2023, 12:46 PM

## 2023-08-28 NOTE — Progress Notes (Signed)
 OT Cancellation Note  Patient Details Name: ION GONNELLA MRN: [id number] DOB: 1955-05-29   Cancelled Treatment:    Reason Eval/Treat Not Completed: Patient declined, no reason specified.  Patient with G tube replaced, continues to leak, patient concerned about moving.  Asking to wait for treatment.  OT can try later as schedule allows.    Cherissa Hook D Taffy Delconte 08/28/2023, 8:47 AM 08/28/2023  RP, OTR/L  Acute Rehabilitation Services  Office:  [phone]

## 2023-08-28 NOTE — Progress Notes (Signed)
 Patient ID: Seth Burch, male   DOB: Dec 11, 1955, 68 y.o.   MRN: [id number]  TCTS Evening Rounds:  Afebrile, Hemodynamically stable in sinus rhythm 80's.   Sats 92%.  UO ok  Bowels working.  Still has a lot of drainage around the J-tube when tube feeds going and pt feels bloated.   Not taking much po. Probably need TNA.   Plans per HL.

## 2023-08-28 NOTE — Progress Notes (Signed)
 NAME:  Seth Burch, MRN:  [id number], DOB:  1956/03/23, LOS: 9 ADMISSION DATE:  08/19/2023, CONSULTATION DATE:  4/7 REFERRING MD:  lightfoot, CHIEF COMPLAINT:  post-op care    History of Present Illness:  68 year old male patient who is status post esophagectomy on 07/28/2023 Discharged after 22 days with complicated course due to aspiration pneumonia, respiratory failure, AKI and delirium.  Follow-up swallowing studies were negative for leaks completed antibiotics on 3/27 had been discharged on 4/1 was eating dinner that evening and developed sudden onset of right sided chest discomfort.  He reported to the emergency room at this time showed new tachycardia, chest x-ray with increased opacity at right base white blood cell count 15.7 up from 12,000 and a lactate of 2.8 he was started on IV fluids, administered IV antibiotics, and thoracic surgery was consulted.  Diagnostic imaging was consistent with esophageal leak at the anastomosis site with associated pleural effusion he therefore went to the operating room for video-assisted thoracoscopy with decortication, as well as esophageal stent placement on 4/2.  Postoperative course fairly unremarkable, recovering in the intensive care, receiving tube feeds via J-tube, and dysphagia 1 diet was started.  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  He is status post radiation and chemotherapy January 2025 follow-up PET scan showed substantial improvement in hypermetabolic activity.  Just discharged following robotic assisted esophagectomy and placement of jejunostomy tube 3/10  we could check a left now I do not want to nSignificant Hospital Events: Including procedures, antibiotic start and stop dates in addition  to other pertinent events   Discharged post esophagectomy, readmitted later that day.Initially started on vancomycin and cefepime,  4/2, VATS with decortication and esophageal stent placement  4/3 changed to fluconazole and levofloxacin on  4/4 through 4/6: Had some J-tube leakage.Tolerating dysphagia 1 diet 4/7 pulmonary/critical care asked to evaluate and assist with ICU care culturesFrom just growing moderate lactobacillus species and few Rothia MUCILAGINOSA, change antibiotics to Unasyn  Interim History / Subjective:  No acute overnight events, J tube was replaced yesterday but is still leaking He feels frustrated with this   Objective   Blood pressure 115/60, pulse 92, temperature 98.2 F (36.8 C), temperature source Oral, resp. rate 19, height 5\' 10"  (1.778 m), weight 78.5 kg, SpO2 91%.        Intake/Output Summary (Last 24 hours) at 08/28/2023 0815 Last data filed at 08/28/2023 0600 Gross per 24 hour  Intake 466.39 ml  Output 1545 ml  Net -1078.61 ml   Filed Weights   08/20/23 2234 08/23/23 1524 08/25/23 0600  Weight: 80.6 kg 81.3 kg 78.5 kg    General:  well nourished M, resting in bed in NAD  HEENT: MM pink/moist Neuro: alert and oriented and moving all extremities  CV: s1s2 rrr, no m/r/g PULM:  R chest tube with serous/milky appearing drainage 30cc in last 24hrs GI: soft, bsx4 active, J tube with dry dressing in place, no abdominal TTP Extremities: warm/dry, no edema  Skin: no rashes or lesions   Labs: Glu 167, WBC 9.3 Hgb 9.3, Na 134, Creatinine 1.6, WBC 9.3   Micro: 4/2 wound cx>lactobacillus, rothia 4/1 BC 1/2> lactobacillius Unasyn 4/6- Fluconazole 4/3-4/9  Tubes/lines/drains  R chest port  J tube R chest tube 4/3  I/O: 1.5L UOP yesterday 30cc CT output    Resolved Hospital Problem list     Assessment & Plan:   Esophageal leak complicated by right sided empyema now status post video-assisted thoracotomy with decortication and esophageal  stent Abx initiated 4/1 with Vanc and Zosyn, Unasyn 4/7, Fluconazole 4/3 -Chest tube management per thoracic surgery -continue Dysphagia 1 diet -Advance tube feeds per surgical team -continue Unasyn, end date 4/20, completed Fluconazole 4/9 -encourage mobilization, PT has been consulted  -continue prn Dilaudid, Tylenol and Oxy  J tube leak -replaced with IR 4/9, continues to leak -KUB pending  Recurrent AKI with mild nonanion gap metabolic acidosis. Creatinine slowly improving with good UOP, suspect creatinine is lagging behind renal recovery -continue to monitor I/O -Renally dose medications as indicated -monitor renal indices and avoid nephrotoxins -bicarb 22 today, d/c oral supplementation  Acute blood loss anemia. -stable, continue to monitor, transfuse for Hgb <7   History of diabetes with hyperglycemia -Sliding scale insulin -Goal 140-180  History of hypertension -Continuing his Lopressor 12.5 mg twice daily -Holding his Norvasc currently (BP OK)  History of hyperlipidemia -continue lipitor   BPH -Continue Flomax  Esophageal cancer status post esophagectomy as well as radiation and chemo -Follow-up with heme-onc/surgery as indicated  Best Practice (right click and "Reselect all SmartList Selections" daily)   Diet/type: dysphagia diet (see orders), TF held DVT prophylaxis LMWH Pressure ulcer(s): N/A GI prophylaxis: PPI Lines: N/A Foley:  N/A Code Status:  full code Last date of multidisciplinary goals of care discussion [per primary ]  Critical care time: NA    Darcella Gasman Ayala Ribble, PA-C [facility] See Amion for pager If no response to pager , please call [phone] until 7pm After 7:00 pm call Elink  [phone]

## 2023-08-29 ENCOUNTER — Inpatient Hospital Stay (HOSPITAL_COMMUNITY)

## 2023-08-29 ENCOUNTER — Ambulatory Visit: Payer: Self-pay | Admitting: Thoracic Surgery (Cardiothoracic Vascular Surgery)

## 2023-08-29 ENCOUNTER — Other Ambulatory Visit: Payer: Self-pay

## 2023-08-29 DIAGNOSIS — N179 Acute kidney failure, unspecified: Secondary | ICD-10-CM | POA: Diagnosis not present

## 2023-08-29 DIAGNOSIS — N1831 Chronic kidney disease, stage 3a: Secondary | ICD-10-CM | POA: Diagnosis not present

## 2023-08-29 DIAGNOSIS — J869 Pyothorax without fistula: Secondary | ICD-10-CM | POA: Diagnosis not present

## 2023-08-29 DIAGNOSIS — K9189 Other postprocedural complications and disorders of digestive system: Secondary | ICD-10-CM | POA: Diagnosis not present

## 2023-08-29 DIAGNOSIS — E876 Hypokalemia: Secondary | ICD-10-CM

## 2023-08-29 DIAGNOSIS — N401 Enlarged prostate with lower urinary tract symptoms: Secondary | ICD-10-CM

## 2023-08-29 HISTORY — PX: IR REPLC GASTRO/COLONIC TUBE PERCUT W/FLUORO: IMG2333

## 2023-08-29 LAB — CBC
HCT: 25.9 % — ABNORMAL LOW (ref 39.0–52.0)
Hemoglobin: 8.3 g/dL — ABNORMAL LOW (ref 13.0–17.0)
MCH: 29.6 pg (ref 26.0–34.0)
MCHC: 32 g/dL (ref 30.0–36.0)
MCV: 92.5 fL (ref 80.0–100.0)
Platelets: 336 10*3/uL (ref 150–400)
RBC: 2.8 MIL/uL — ABNORMAL LOW (ref 4.22–5.81)
RDW: 15.4 % (ref 11.5–15.5)
WBC: 7.7 10*3/uL (ref 4.0–10.5)
nRBC: 0 % (ref 0.0–0.2)

## 2023-08-29 LAB — GLUCOSE, CAPILLARY
Glucose-Capillary: 110 mg/dL — ABNORMAL HIGH (ref 70–99)
Glucose-Capillary: 146 mg/dL — ABNORMAL HIGH (ref 70–99)
Glucose-Capillary: 113 mg/dL — ABNORMAL HIGH (ref 70–99)
Glucose-Capillary: 142 mg/dL — ABNORMAL HIGH (ref 70–99)
Glucose-Capillary: 223 mg/dL — ABNORMAL HIGH (ref 70–99)
Glucose-Capillary: 196 mg/dL — ABNORMAL HIGH (ref 70–99)

## 2023-08-29 LAB — BASIC METABOLIC PANEL WITH GFR
Anion gap: 9 (ref 5–15)
BUN: 20 mg/dL (ref 8–23)
CO2: 22 mmol/L (ref 22–32)
Calcium: 7.6 mg/dL — ABNORMAL LOW (ref 8.9–10.3)
Chloride: 105 mmol/L (ref 98–111)
Creatinine, Ser: 1.56 mg/dL — ABNORMAL HIGH (ref 0.61–1.24)
GFR, Estimated: 49 mL/min — ABNORMAL LOW (ref >60)
Glucose, Bld: 134 mg/dL — ABNORMAL HIGH (ref 70–99)
Potassium: 3.2 mmol/L — ABNORMAL LOW (ref 3.5–5.1)
Sodium: 136 mmol/L (ref 135–145)

## 2023-08-29 LAB — PHOSPHORUS: Phosphorus: 2.8 mg/dL (ref 2.5–4.6)

## 2023-08-29 LAB — MAGNESIUM: Magnesium: 1.8 mg/dL (ref 1.7–2.4)

## 2023-08-29 MED ORDER — VANCOMYCIN HCL 1500 MG/300ML IV SOLN
1500.0000 mg | Freq: Once | INTRAVENOUS | Status: AC
  Administered 2023-08-29: 1500 mg via INTRAVENOUS
  Filled 2023-08-29: qty 300

## 2023-08-29 MED ORDER — LIDOCAINE VISCOUS HCL 2 % MT SOLN
OROMUCOSAL | Status: AC
  Filled 2023-08-29: qty 15

## 2023-08-29 MED ORDER — TRAVASOL 10 % IV SOLN
INTRAVENOUS | Status: AC
  Filled 2023-08-29: qty 800.6

## 2023-08-29 MED ORDER — HEPARIN SOD (PORK) LOCK FLUSH 100 UNIT/ML IV SOLN
500.0000 [IU] | INTRAVENOUS | Status: AC | PRN
  Administered 2023-08-29: 500 [IU]

## 2023-08-29 MED ORDER — MAGNESIUM SULFATE 2 GM/50ML IV SOLN
2.0000 g | Freq: Once | INTRAVENOUS | Status: AC
  Administered 2023-08-29: 2 g via INTRAVENOUS
  Filled 2023-08-29: qty 50

## 2023-08-29 MED ORDER — LIDOCAINE HCL 1 % IJ SOLN
INTRAMUSCULAR | Status: AC
  Filled 2023-08-29: qty 20

## 2023-08-29 MED ORDER — INSULIN ASPART 100 UNIT/ML IJ SOLN
0.0000 [IU] | INTRAMUSCULAR | Status: DC
  Administered 2023-08-29: 3 [IU] via SUBCUTANEOUS
  Administered 2023-08-29: 5 [IU] via SUBCUTANEOUS
  Administered 2023-08-30: 8 [IU] via SUBCUTANEOUS

## 2023-08-29 MED ORDER — ENSURE ENLIVE PO LIQD
237.0000 mL | Freq: Three times a day (TID) | ORAL | Status: DC
  Administered 2023-08-29: 237 mL via ORAL

## 2023-08-29 MED ORDER — POTASSIUM CHLORIDE 20 MEQ PO PACK: 40.0000 meq | PACK | Freq: Every day | ORAL | Status: DC

## 2023-08-29 MED ORDER — IOHEXOL 300 MG/ML  SOLN
50.0000 mL | Freq: Once | INTRAMUSCULAR | Status: AC | PRN
  Administered 2023-08-29: 10 mL

## 2023-08-29 MED ORDER — SODIUM CHLORIDE 0.9% FLUSH
10.0000 mL | Freq: Two times a day (BID) | INTRAVENOUS | Status: DC
  Administered 2023-08-29: 10 mL

## 2023-08-29 MED ORDER — PROSOURCE PLUS PO LIQD
30.0000 mL | Freq: Two times a day (BID) | ORAL | Status: DC
  Administered 2023-08-29 – 2023-09-04 (×8): 30 mL via ORAL
  Filled 2023-08-29 (×10): qty 30

## 2023-08-29 MED ORDER — VANCOMYCIN HCL 1250 MG/250ML IV SOLN
1250.0000 mg | INTRAVENOUS | Status: AC
  Administered 2023-08-30 – 2023-09-02 (×4): 1250 mg via INTRAVENOUS
  Filled 2023-08-29 (×4): qty 250

## 2023-08-29 MED ORDER — POTASSIUM CHLORIDE 20 MEQ PO PACK
40.0000 meq | PACK | Freq: Every day | ORAL | Status: DC
  Administered 2023-08-29: 40 meq via ORAL
  Filled 2023-08-29: qty 2

## 2023-08-29 MED ORDER — SODIUM CHLORIDE 0.9% FLUSH: 10.0000 mL | INTRAVENOUS | Status: DC | PRN

## 2023-08-29 NOTE — Progress Notes (Signed)
 OT Cancellation Note  Patient Details Name: Seth Burch MRN: [id number] DOB: [birth year]   Cancelled Treatment:    Reason Eval/Treat Not Completed: Patient at procedure or test/ unavailable.  Patient going for feeding tube replacement.  Will hold and continue efforts as appropriate.    Tikita Mabee D Tahjir Silveria 08/29/2023, 8:33 AM 08/29/2023  RP, OTR/L  Acute Rehabilitation Services  Office:  [phone]

## 2023-08-29 NOTE — Progress Notes (Signed)
 PT Cancellation Note  Patient Details Name: RENJI BERWICK MRN: [id number] DOB: [birth year]   Cancelled Treatment:    Reason Eval/Treat Not Completed: Patient at procedure or test/unavailable (Patient going for feeding tube replacement.  Acute PT to hold and re-attempt as appropriate. Acute PT to follow.)  Hilton Cork, PT, DPT Secure Chat Preferred  Rehab Office [phone]  Arturo Morton Brion Aliment 08/29/2023, 9:00 AM

## 2023-08-29 NOTE — Progress Notes (Addendum)
 NAME:  Seth Burch, MRN:  [id number], DOB:  04/02/1956, LOS: 10 ADMISSION DATE:  08/19/2023, CONSULTATION DATE:  4/7 REFERRING MD:  lightfoot, CHIEF COMPLAINT:  post-op care    History of Present Illness:  68 year old male patient who is status post esophagectomy on 07/28/2023 Discharged after 22 days with complicated course due to aspiration pneumonia, respiratory failure, AKI and delirium.  Follow-up swallowing studies were negative for leaks completed antibiotics on 3/27 had been discharged on 4/1 was eating dinner that evening and developed sudden onset of right sided chest discomfort.  He reported to the emergency room at this time showed new tachycardia, chest x-ray with increased opacity at right base white blood cell count 15.7 up from 12,000 and a lactate of 2.8 he was started on IV fluids, administered IV antibiotics, and thoracic surgery was consulted.  Diagnostic imaging was consistent with esophageal leak at the anastomosis site with associated pleural effusion he therefore went to the operating room for video-assisted thoracoscopy with decortication, as well as esophageal stent placement on 4/2.  Postoperative course fairly unremarkable, recovering in the intensive care, receiving tube feeds via J-tube, and dysphagia 1 diet was started.  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  He is status post radiation and chemotherapy January 2025 follow-up PET scan showed substantial improvement in hypermetabolic activity.  Just discharged following robotic assisted esophagectomy and placement of jejunostomy tube 3/10  we could check a left now I do not want to nSignificant Hospital Events: Including procedures, antibiotic start and stop dates in addition  to other pertinent events   Discharged post esophagectomy, readmitted later that day.Initially started on vancomycin and cefepime,  4/2, VATS with decortication and esophageal stent placement  4/3 changed to fluconazole and levofloxacin on  4/4 through 4/6: Had some J-tube leakage.Tolerating dysphagia 1 diet 4/7 pulmonary/critical care asked to evaluate and assist with ICU care culturesFrom just growing moderate lactobacillus species and few Rothia MUCILAGINOSA, change antibiotics to Unasyn  Interim History / Subjective:  No acute events overnight.  Some thick drainage from his incision and stressed on his right side.  Still has some leaking around his J-tube.  Objective   Blood pressure 133/70, pulse 89, temperature 98.4 F (36.9 C), temperature source Oral, resp. rate (!) 22, height 5\' 10"  (1.778 m), weight 78.5 kg, SpO2 93%.        Intake/Output Summary (Last 24 hours) at 08/29/2023 0727 Last data filed at 08/29/2023 0600 Gross per 24 hour  Intake 1211.04 ml  Output 1370 ml  Net -158.96 ml   Filed Weights   08/20/23 2234 08/23/23 1524 08/25/23 0600  Weight: 80.6 kg 81.3 kg 78.5 kg    General: Chronically ill-appearing man lying in bed no acute distress HEENT:/AT, eyes anicteric Neuro: Awake and alert, answering questions appropriately, moving all extremities CV: S1-S2, regular rate and rhythm PULM: Breathing comfortably on room air, CTAB.  Chest tube on the right. GI: Soft, nontender.  J-tube with some mild erythema around it, barrier cream in place.  No active drainage. Extremities: no peripheral edema Skin: No rashes  Potassium 3.2 BUN 20 Creatinine 1.56 WBC 7.7 H/H 8.3/25.9 Platelets 336   Micro: 4/2 wound cx>lactobacillus, rothia Fungus culture pending 4/1 [facility] 1/2> lactobacillius 1/4   Resolved Hospital Problem list  Assessment & Plan:   Esophageal leak complicated by right sided empyema, status post video-assisted thoracotomy with decortication and  esophageal stent -Chest tube management per thoracic surgery.  Planning on reopening incision and placing wound VAC.  Requested cultures from this tissue be completed - Add vancomycin - Continue dysphagia 1 diet.  Although the patient feels that he could progress his diet more based on previous findings during EGD it is unlikely that he is able to tolerate more intake past his anastomosis. -Adding Prosource and increasing Ensure to 3 times daily. - Starting TPN today -As needed pain control  J tube leak -Appreciate IR's management, planning on upsizing today  Recurrent AKI with mild nonanion gap metabolic acidosis, improving -Strict I's/O - Renally dose meds and avoid nephrotoxic meds  Acute blood loss anemia, anemia of chronic disease -Transfuse for hemoglobin less than 7 or hemodynamically significant bleeding History of diabetes with hyperglycemia -Sliding scale insulin -Goal 140-180  History of hypertension -Continuing his Lopressor 12.5 mg twice daily -Holding his Norvasc currently (BP OK)  History of hyperlipidemia -Continue statin  BPH -Continue Flomax  Hypokalemia -Replete potassium  Esophageal cancer status post esophagectomy as well as radiation and chemo -Follow-up with heme-onc/surgery   Severe protein energy malnutrition -start TPN, increase enteral protein  Best Practice (right click and "Reselect all SmartList Selections" daily)   Diet/type: dysphagia diet (see orders), TF held DVT prophylaxis LMWH Pressure ulcer(s): N/A GI prophylaxis: PPI Lines: N/A, Port-A-Cath Foley:  N/A Code Status:  full code Last date of multidisciplinary goals of care discussion [per primary ]  Critical care time: NA    Steffanie Dunn, DO 08/29/23 8:56 AM [facility]  For contact information, see Amion. If no response to pager, please call PCCM consult pager. After hours, 7PM- 7AM, please call Elink.

## 2023-08-29 NOTE — Progress Notes (Addendum)
 Nutrition Brief Note  Consult received for TPN recommendations to initiate therapy for patient. Provided following recommendations to pharmacy:  Estimated Nutritional Needs:  Kcal:  2200-2400kcal Protein:  115-130g Fluid:  >=2L/day  Patient is at significant refeeding risk. Recommend addition of MVI and thiamine to TPN. Noted CCM also added Ensure Enlive and ProSource to patient regimen today. He has declined these in the past. Will leave in place to assess acceptance rate.   Patient also scheduled for another J-tube replacement today, as he continues with significant leakage around the site. Will also encourage healing around J-tube site. Skin has become macerated. Per surgery note, he continues to endorse pressure and bloating in lower abdomen despite tube feedings being held. PO diet remains in place. TPN recs to meet 100% of estimated needs as he is only taking bites, if any, at each meal and with poor acceptance of any protein supplementation.   INTERVENTION:  Initiate supplemental TPN Continue Premier Protein shakes as tolerated, brought in by family. Each shake has 30g of protein but only 160 calories.  Monitor diet advancement and tolerance Continue to encourage addition of patient purchased unflavored protein powder to low protein food items Ensure Enlive po TID, each supplement provides 350 kcal and 20 grams of protein.  30 ml ProSource Plus BID, each supplement provides 100 kcals and 15 grams protein.   Monitor magnesium, potassium, and phosphorus daily for at least 3 days, MD to replete as needed     NUTRITION DIAGNOSIS:  Severe Malnutrition related to chronic illness (esophageal cancer s/p chemo, radiation and surgery) as evidenced by severe fat depletion, severe muscle depletion, percent weight loss.- remains applicable   GOAL:  Patient will meet greater than or equal to 90% of their needs - progressing   MONITOR:  PO intake, Diet advancement, TF tolerance  Myrtie Cruise  MS, RD, LDN Registered Dietitian Clinical Nutrition RD Inpatient Contact Info in Amion

## 2023-08-29 NOTE — Progress Notes (Signed)
 Pharmacy Antibiotic Note  Seth Burch is a 68 y.o. male admitted on 08/19/2023 with esophagectomy. Pt grew lactobacillus in blood so has been on Unasyn, now with purulent wound. Pharmacy has been consulted for vancomycin dosing.  Plan: Vancomycin 1500mg  IV x1 then 1250mg  IV q24h  Follow Cr, vancomycin levels PRN  Height: 5\' 10"  (177.8 cm) Weight: 78.5 kg (173 lb 1 oz) IBW/kg (Calculated) : 73  Temp (24hrs), Avg:98.5 F (36.9 C), Min:98.4 F (36.9 C), Max:98.9 F (37.2 C)  Recent Labs  Lab 08/25/23 0343 08/26/23 0255 08/27/23 0252 08/28/23 0646 08/29/23 0418  WBC 4.5 7.4 10.0 9.3 7.7  CREATININE 1.92* 1.87* 1.75* 1.62* 1.56*    Estimated Creatinine Clearance: 48.1 mL/min (A) (by C-G formula based on SCr of 1.56 mg/dL (H)).    No Known Allergies     [name], PharmD, BCPS, [facility] Clinical Pharmacist [phone] Please check AMION for all [facility] numbers 08/29/2023

## 2023-08-29 NOTE — Progress Notes (Signed)
 Peripherally Inserted Central Catheter Placement  The IV Nurse has discussed with the patient and/or persons authorized to consent for the patient, the purpose of this procedure and the potential benefits and risks involved with this procedure.  The benefits include less needle sticks, lab draws from the catheter, and the patient may be discharged home with the catheter. Risks include, but not limited to, infection, bleeding, blood clot (thrombus formation), and puncture of an artery; nerve damage and irregular heartbeat and possibility to perform a PICC exchange if needed/ordered by physician.  Alternatives to this procedure were also discussed.  Bard Power PICC patient education guide, fact sheet on infection prevention and patient information card has been provided to patient /or left at bedside.    PICC Placement Documentation  PICC Double Lumen 08/29/23 Right Basilic 40 cm 0 cm (Active)  Indication for Insertion or Continuance of Line Administration of hyperosmolar/irritating solutions (i.e. TPN, Vancomycin, etc.) 08/29/23 1235  Exposed Catheter (cm) 0 cm 08/29/23 1235  Site Assessment Clean, Dry, Intact 08/29/23 1235  Lumen #1 Status Flushed;Saline locked;Blood return noted 08/29/23 1235  Lumen #2 Status Flushed;Saline locked;Blood return noted 08/29/23 1235  Dressing Type Transparent;Securing device 08/29/23 1235  Dressing Status Antimicrobial disc/dressing in place;Clean, Dry, Intact 08/29/23 1235  Line Care Connections checked and tightened 08/29/23 1235  Line Adjustment (NICU/IV Team Only) No 08/29/23 1235  Dressing Intervention New dressing;Adhesive placed at insertion site (IV team only) 08/29/23 1235  Dressing Change Due 09/05/23 08/29/23 1235       Maximino Greenland 08/29/2023, 12:47 PM

## 2023-08-29 NOTE — Progress Notes (Signed)
 PHARMACY - TOTAL PARENTERAL NUTRITION CONSULT NOTE   Indication:  inability to tolerate enteral nutrition  Patient Measurements: Height: 5\' 10"  (177.8 cm) Weight: 78.5 kg (173 lb 1 oz) IBW/kg (Calculated) : 73 TPN AdjBW (KG): 80.6 Body mass index is 24.83 kg/m. Usual Weight: 77.1 kg at admission; patient reports 200 lbs in January  Assessment: 67yoM presenting for chest pain after admission 3/10 - 4/1 for adenocarcinoma s/p esophagectomy and percutaneous J-tube placement. On previous admission, was advanced to goal TF on POD4 (3/14) then thick liquids/dysphagia 2 3/19. Discharged home on 4/1 with dysphagia 3/thin liquid diet. 4/4 Presented back to [facility] for R VATS, EGD, and esophageal stent placement. Was advanced to goal TF on 4/4 that was subsequently held due to J-tube leaking, replaced 4/9 by IR. Tube feeds attempted 4/5, 4/7, 4/10 AM however concern of limited absorption with persistent leaking. J-tube continues to leak 4/11 AM, note of saturated dressing surrounding J-tube site when discussing with patient at bedside.   4/10 KUB showed nonobstructive gas, no sign of ileus. However, patient complaint of significant abdominal distension with tube feeding, however is not experiencing pain. Per RN, bowel movements were possibly unabsorbed tube feeds. Family has brought in protein shakes however pt states does not want to eat significant amount of food due to concern for frequent leaking from J-tube. Primary team concerned at this time for lack of wound healing with lack of nutrition as well. Now s/p successful J-tube exchange 4/11 afternoon, however team anticipates challenges with advancing enteral nutrition to goal. Pharmacy consulted for TPN initiation 4/11.   Glucose / Insulin: hx DM, A1c 7.1; CBGs 113 - 153 with 8u SSI prior to initiation of TPN Electrolytes: Na 136, K 3.2, Cl 105, CO2 22, CoCa ~9.5, Phos 2.8, Mg 1.8 Renal: Scr 1.56, BUN 20 (appears to be roughly at baseline) Hepatic: 4/8  Alk Phos 209, Albumin 1.7, AST/ALT WNL, Tbili 0.6 Intake / Output; MIVF: 0.6 mL/kg/h UOP (1200 mL), 2x BM 4/10 (Type 6 & type 7, medium size) Net -875 mL GI Imaging: 4/10 KUB: nonobstructive gas, no free air, no stool burden GI Surgeries / Procedures:  4/9 VIR J-tube exchange 4/11 repeat J-tube exchange  Central access: PICC 4/11 TPN start date: 4/11  Nutritional Goals: Goal TPN rate is 90 mL/hr (provides 120 g of protein and 2,252 kcals per day)  RD Assessment: Estimated Needs Total Energy Estimated Needs: 2200-2400kcal Total Protein Estimated Needs: 115-130g Total Fluid Estimated Needs: >=2L/day  Current Nutrition:  NPO for J-tube exchange Previously on/off tube feeding  Plan:  Start TPN at 60 mL/hr at 1800 (providing 80 g protein, ~1500 kcal) Electrolytes in TPN: Na 100 mEq/L, K 30 mEq/L (total 42 mEq), Ca 0 mEq/L, Mg 5 mEq/L, and Phos 5 mmol/L. Cl:Ac 1:2 Supplement Mg 2g IV outside of TPN Discontinue Kcl 40 mEq PO daily, okay with team Add standard MVI and trace elements to TPN Adjust Moderate SSI from WC to q4h, adjust as needed  Monitor TPN labs on Mon/Thurs, next labs 4/12 AM F/u J-tube utilization, re-trial of TF if no leaking  Rutherford Nail, PharmD PGY2 Critical Care Pharmacy Resident 08/29/2023,9:07 AM

## 2023-08-29 NOTE — Progress Notes (Signed)
      [address]       [name] [address]             [phone]      9 Days Post-Op Procedure(s) (LRB): VIDEO ASSISTED THORACOSCOPY (VATS)/DECORTICATION (Right) EGD (ESOPHAGOGASTRODUODENOSCOPY) (N/A)  Subjective:  Patient continues to have abdominal bloating despite tube feedings being held.  He continues to not be passing gas.  He is reasonably frustrated about his situation.  Objective: Vital signs in last 24 hours: Temp:  [98.4 F (36.9 C)-98.9 F (37.2 C)] 98.4 F (36.9 C) (04/11 0400) Pulse Rate:  [79-98] 89 (04/11 0700) Cardiac Rhythm: Normal sinus rhythm (04/11 0400) Resp:  [12-32] 22 (04/11 0700) BP: (117-139)/(68-75) 133/70 (04/11 0400) SpO2:  [91 %-95 %] 93 % (04/11 0700)  Intake/Output from previous day: 04/10 0701 - 04/11 0700 In: 1211 [P.O.:700; NG/GT:30; IV Piggyback:421] Out: 1370 [Urine:1200; Chest Tube:170]  General appearance: alert, cooperative, and no distress Heart: regular rate and rhythm Lungs: diminished breath sounds on right Abdomen: soft non-tender, mild distention, no BS appreciated Extremities: extremities normal, atraumatic, no cyanosis or edema Wound: purulent drainage from thoracotomy site, bilious drainage from J Tube site  Lab Results: Recent Labs    08/28/23 0646 08/29/23 0418  WBC 9.3 7.7  HGB 9.3* 8.3*  HCT 28.2* 25.9*  PLT 387 336   BMET:  Recent Labs    08/28/23 0646 08/29/23 0418  NA 136 136  K 3.9 3.2*  CL 103 105  CO2 22 22  GLUCOSE 145* 134*  BUN 26* 20  CREATININE 1.62* 1.56*  CALCIUM 7.9* 7.6*    PT/INR: No results for input(s): "LABPROT", "INR" in the last 72 hours. ABG    Component Value Date/Time   PHART 7.4 07/29/2023 0514   HCO3 25.8 08/02/2023 2006   TCO2 17 (L) 08/19/2023 2022   ACIDBASEDEF 1.0 08/02/2023 1506   O2SAT 94.5 08/02/2023 2006   CBG (last 3)  Recent Labs    08/28/23 2000 08/28/23 2334 08/29/23 0418  GLUCAP 153* 138* 110*    Assessment/Plan: S/P  Procedure(s) (LRB): VIDEO ASSISTED THORACOSCOPY (VATS)/DECORTICATION (Right) EGD (ESOPHAGOGASTRODUODENOSCOPY) (N/A)  CV- NSR, BP remains stable on Hydralazine, Lopressor Pulm- CT with purulent output 170 cc recorded yesterday, will get CXR as last was on 4/7 Renal- H/O CKD Stage 3- creatinine has been stable GI- on dysphagia 1 diet, oral intake remains poor... supposed to be on continuous tube feedings however unable to tolerate due to stomach discomfort/bloating.. persistent J tube drainage, skin is macerated.. continue diligent wound care.. IR to attempt and place larger tube today to possibly decrease drainage... spoke with Dr. Cliffton Asters who is okay with starting TPN ID- afebrile, no leukocytosis.. frank pus coming from thoracotomy incision, will attempt to place Pravena dressing, continue ABX for lactobacillus from OR culture Hypokalemia- likely due to poor oral intake, will supplement with 40 meq today DM-sugars are well controlled, continue SSIP   LOS: 10 days    [name], PA-C 08/29/2023

## 2023-08-30 ENCOUNTER — Inpatient Hospital Stay (HOSPITAL_COMMUNITY)

## 2023-08-30 DIAGNOSIS — N1831 Chronic kidney disease, stage 3a: Secondary | ICD-10-CM | POA: Diagnosis not present

## 2023-08-30 DIAGNOSIS — N179 Acute kidney failure, unspecified: Secondary | ICD-10-CM | POA: Diagnosis not present

## 2023-08-30 DIAGNOSIS — J869 Pyothorax without fistula: Secondary | ICD-10-CM | POA: Diagnosis not present

## 2023-08-30 DIAGNOSIS — K9189 Other postprocedural complications and disorders of digestive system: Secondary | ICD-10-CM | POA: Diagnosis not present

## 2023-08-30 LAB — GLUCOSE, CAPILLARY
Glucose-Capillary: 118 mg/dL — ABNORMAL HIGH (ref 70–99)
Glucose-Capillary: 148 mg/dL — ABNORMAL HIGH (ref 70–99)
Glucose-Capillary: 148 mg/dL — ABNORMAL HIGH (ref 70–99)
Glucose-Capillary: 156 mg/dL — ABNORMAL HIGH (ref 70–99)
Glucose-Capillary: 169 mg/dL — ABNORMAL HIGH (ref 70–99)
Glucose-Capillary: 224 mg/dL — ABNORMAL HIGH (ref 70–99)
Glucose-Capillary: 269 mg/dL — ABNORMAL HIGH (ref 70–99)

## 2023-08-30 LAB — COMPREHENSIVE METABOLIC PANEL WITH GFR
ALT: 18 U/L (ref 0–44)
AST: 24 U/L (ref 15–41)
Albumin: 1.5 g/dL — ABNORMAL LOW (ref 3.5–5.0)
Alkaline Phosphatase: 236 U/L — ABNORMAL HIGH (ref 38–126)
Anion gap: 11 (ref 5–15)
BUN: 20 mg/dL (ref 8–23)
CO2: 22 mmol/L (ref 22–32)
Calcium: 7.3 mg/dL — ABNORMAL LOW (ref 8.9–10.3)
Chloride: 101 mmol/L (ref 98–111)
Creatinine, Ser: 1.53 mg/dL — ABNORMAL HIGH (ref 0.61–1.24)
GFR, Estimated: 50 mL/min — ABNORMAL LOW (ref >60)
Glucose, Bld: 270 mg/dL — ABNORMAL HIGH (ref 70–99)
Potassium: 3.3 mmol/L — ABNORMAL LOW (ref 3.5–5.1)
Sodium: 134 mmol/L — ABNORMAL LOW (ref 135–145)
Total Bilirubin: 0.4 mg/dL (ref 0.0–1.2)
Total Protein: 5.1 g/dL — ABNORMAL LOW (ref 6.5–8.1)

## 2023-08-30 LAB — PHOSPHORUS: Phosphorus: 2.1 mg/dL — ABNORMAL LOW (ref 2.5–4.6)

## 2023-08-30 LAB — MAGNESIUM: Magnesium: 2 mg/dL (ref 1.7–2.4)

## 2023-08-30 MED ORDER — ALTEPLASE 2 MG IJ SOLR
INTRAMUSCULAR | Status: AC
Start: 2023-08-30 — End: 2023-08-30
  Administered 2023-08-30: 2 mg
  Filled 2023-08-30: qty 2

## 2023-08-30 MED ORDER — KATE FARMS STANDARD 1.4 EN LIQD
1000.0000 mL | ENTERAL | Status: DC
  Filled 2023-08-30: qty 1000

## 2023-08-30 MED ORDER — VITAL HIGH PROTEIN PO LIQD: 1000.0000 mL | ORAL | Status: DC

## 2023-08-30 MED ORDER — ALTEPLASE 2 MG IJ SOLR
2.0000 mg | Freq: Once | INTRAMUSCULAR | Status: DC
  Filled 2023-08-30: qty 2

## 2023-08-30 MED ORDER — INSULIN ASPART 100 UNIT/ML IJ SOLN
0.0000 [IU] | INTRAMUSCULAR | Status: DC
  Administered 2023-08-30: 7 [IU] via SUBCUTANEOUS
  Administered 2023-08-30 (×2): 3 [IU] via SUBCUTANEOUS
  Administered 2023-08-30 – 2023-08-31 (×4): 4 [IU] via SUBCUTANEOUS
  Administered 2023-08-31: 7 [IU] via SUBCUTANEOUS
  Administered 2023-08-31 (×2): 4 [IU] via SUBCUTANEOUS
  Administered 2023-08-31 – 2023-09-01 (×5): 7 [IU] via SUBCUTANEOUS
  Administered 2023-09-01 – 2023-09-02 (×2): 4 [IU] via SUBCUTANEOUS
  Administered 2023-09-02: 7 [IU] via SUBCUTANEOUS
  Administered 2023-09-02 (×2): 4 [IU] via SUBCUTANEOUS
  Administered 2023-09-02 (×2): 7 [IU] via SUBCUTANEOUS
  Administered 2023-09-03 (×5): 3 [IU] via SUBCUTANEOUS
  Administered 2023-09-04: 4 [IU] via SUBCUTANEOUS
  Administered 2023-09-04: 3 [IU] via SUBCUTANEOUS
  Administered 2023-09-05 (×4): 4 [IU] via SUBCUTANEOUS
  Administered 2023-09-05: 3 [IU] via SUBCUTANEOUS
  Administered 2023-09-06: 4 [IU] via SUBCUTANEOUS
  Administered 2023-09-06: 7 [IU] via SUBCUTANEOUS
  Administered 2023-09-06: 3 [IU] via SUBCUTANEOUS
  Administered 2023-09-06: 4 [IU] via SUBCUTANEOUS
  Administered 2023-09-07: 3 [IU] via SUBCUTANEOUS
  Administered 2023-09-07 (×2): 4 [IU] via SUBCUTANEOUS
  Administered 2023-09-08 (×2): 3 [IU] via SUBCUTANEOUS
  Administered 2023-09-08: 4 [IU] via SUBCUTANEOUS
  Administered 2023-09-08 – 2023-09-09 (×3): 3 [IU] via SUBCUTANEOUS
  Administered 2023-09-09: 4 [IU] via SUBCUTANEOUS
  Administered 2023-09-10: 3 [IU] via SUBCUTANEOUS
  Administered 2023-09-10: 4 [IU] via SUBCUTANEOUS
  Administered 2023-09-10 (×3): 3 [IU] via SUBCUTANEOUS
  Administered 2023-09-11: 7 [IU] via SUBCUTANEOUS
  Administered 2023-09-11: 4 [IU] via SUBCUTANEOUS
  Administered 2023-09-11: 3 [IU] via SUBCUTANEOUS
  Administered 2023-09-12: 4 [IU] via SUBCUTANEOUS
  Administered 2023-09-12: 11 [IU] via SUBCUTANEOUS
  Administered 2023-09-12 (×3): 4 [IU] via SUBCUTANEOUS
  Administered 2023-09-13: 11 [IU] via SUBCUTANEOUS
  Administered 2023-09-13: 4 [IU] via SUBCUTANEOUS
  Administered 2023-09-13 (×3): 3 [IU] via SUBCUTANEOUS
  Administered 2023-09-14: 4 [IU] via SUBCUTANEOUS
  Administered 2023-09-14 (×2): 3 [IU] via SUBCUTANEOUS
  Administered 2023-09-14 (×2): 4 [IU] via SUBCUTANEOUS
  Administered 2023-09-15: 3 [IU] via SUBCUTANEOUS
  Administered 2023-09-15 (×2): 4 [IU] via SUBCUTANEOUS

## 2023-08-30 MED ORDER — ALTEPLASE 2 MG IJ SOLR: 2.0000 mg | Freq: Once | INTRAMUSCULAR | Status: AC

## 2023-08-30 MED ORDER — CALCIUM GLUCONATE-NACL 1-0.675 GM/50ML-% IV SOLN
1.0000 g | Freq: Once | INTRAVENOUS | Status: AC
  Administered 2023-08-30: 1000 mg via INTRAVENOUS
  Filled 2023-08-30: qty 50

## 2023-08-30 MED ORDER — METOPROLOL TARTRATE 25 MG PO TABS: 25.0000 mg | ORAL_TABLET | Freq: Two times a day (BID) | ORAL | Status: DC

## 2023-08-30 MED ORDER — SODIUM CHLORIDE 0.9 % IV SOLN
15.0000 mmol | Freq: Once | INTRAVENOUS | Status: AC
  Administered 2023-08-30: 15 mmol via INTRAVENOUS
  Filled 2023-08-30: qty 5

## 2023-08-30 MED ORDER — TRAVASOL 10 % IV SOLN
INTRAVENOUS | Status: AC
  Filled 2023-08-30: qty 835.2

## 2023-08-30 MED ORDER — INSULIN GLARGINE-YFGN 100 UNIT/ML ~~LOC~~ SOLN
5.0000 [IU] | Freq: Every day | SUBCUTANEOUS | Status: DC
  Administered 2023-08-30: 5 [IU] via SUBCUTANEOUS
  Filled 2023-08-30 (×2): qty 0.05

## 2023-08-30 MED ORDER — KATE FARMS STANDARD 1.4 EN LIQD
1000.0000 mL | ENTERAL | Status: DC
  Administered 2023-08-30: 1000 mL
  Filled 2023-08-30: qty 1000

## 2023-08-30 MED ORDER — METOPROLOL TARTRATE 25 MG PO TABS
25.0000 mg | ORAL_TABLET | Freq: Two times a day (BID) | ORAL | Status: DC
  Administered 2023-08-30 – 2023-09-15 (×32): 25 mg via ORAL
  Filled 2023-08-30 (×6): qty 1
  Filled 2023-08-30: qty 2
  Filled 2023-08-30 (×26): qty 1

## 2023-08-30 NOTE — Progress Notes (Signed)
 [address]       [place] [address]             [phone]      10 Days Post-Op  Procedure(s) (LRB): VIDEO ASSISTED THORACOSCOPY (VATS)/DECORTICATION (Right) EGD (ESOPHAGOGASTRODUODENOSCOPY) (N/A)   Total Length of Stay:  LOS: 11 days    SUBJECTIVE: Still frustrated with length of stay No abd pain Says less drainage for new J tube  Vitals:   08/30/23 0500 08/30/23 0600  BP: (!) 148/82 121/60  Pulse: 99 99  Resp: (!) 23 (!) 21  Temp:    SpO2: 94% 94%    Intake/Output      04/11 0701 04/12 0700 04/12 0701 04/13 0700   P.O.     I.V. (mL/kg) 707.5 (10)    Other     NG/GT     IV Piggyback 747.5    Total Intake(mL/kg) 1455 (20.6)    Urine (mL/kg/hr) 1800 (1.1)    Drains 70    Stool 0    Chest Tube 440    Total Output 2310    Net -855         Stool Occurrence 1 x        ampicillin-sulbactam (UNASYN) IV Stopped (08/30/23 0230)   potassium PHOSPHATE IVPB (in mmol)     TPN ADULT (ION) 60 mL/hr at 08/30/23 0600   vancomycin      CBC    Component Value Date/Time   WBC 7.7 08/29/2023 0418   RBC 2.80 (L) 08/29/2023 0418   HGB 8.3 (L) 08/29/2023 0418   HGB 12.4 (L) 07/03/2023 1105   HCT 25.9 (L) 08/29/2023 0418   PLT 336 08/29/2023 0418   PLT 195 07/03/2023 1105   MCV 92.5 08/29/2023 0418   MCH 29.6 08/29/2023 0418   MCHC 32.0 08/29/2023 0418   RDW 15.4 08/29/2023 0418   LYMPHSABS 0.8 08/28/2023 0646   MONOABS 1.0 08/28/2023 0646   EOSABS 0.1 08/28/2023 0646   BASOSABS 0.0 08/28/2023 0646   CMP     Component Value Date/Time   NA 134 (L) 08/30/2023 0440   K 3.3 (L) 08/30/2023 0440   CL 101 08/30/2023 0440   CO2 22 08/30/2023 0440   GLUCOSE 270 (H) 08/30/2023 0440   BUN 20 08/30/2023 0440   CREATININE 1.53 (H) 08/30/2023 0440   CREATININE 1.35 (H) 07/03/2023 1105   CALCIUM 7.3 (L) 08/30/2023 0440   PROT 5.1 (L) 08/30/2023 0440   ALBUMIN 1.5 (L) 08/30/2023 0440   AST 24 08/30/2023 0440   AST 28 07/03/2023 1105   ALT  18 08/30/2023 0440   ALT 23 07/03/2023 1105   ALKPHOS 236 (H) 08/30/2023 0440   BILITOT 0.4 08/30/2023 0440   BILITOT 0.6 07/03/2023 1105   GFRNONAA 50 (L) 08/30/2023 0440   GFRNONAA 58 (L) 07/03/2023 1105   ABG    Component Value Date/Time   PHART 7.4 07/29/2023 0514   PCO2ART 36 07/29/2023 0514   PO2ART 70 (L) 07/29/2023 0514   HCO3 25.8 08/02/2023 2006   TCO2 17 (L) 08/19/2023 2022   ACIDBASEDEF 1.0 08/02/2023 1506   O2SAT 94.5 08/02/2023 2006   CBG (last 3)  Recent Labs    08/29/23 2016 08/29/23 2318 08/30/23 0444  GLUCAP 223* 196* 269*  EXAM Lungs: decreased on right Card: rr Ext: warm Abd: soft not distended   ASSESSMENT: SP esophagectomy with prolonged post op course Leave chest tubes Start trickle feeds and observe On dysphagia diet  On TPN   Seth Sportsman, MD 08/30/2023

## 2023-08-30 NOTE — Progress Notes (Signed)
 Nutrition Follow-up / Consult  DOCUMENTATION CODES:   Severe malnutrition in context of chronic illness  INTERVENTION:   [employer] 1.4 at 10 ml/h via J-tube provides 336 kcal, 15 gm protein, 170 ml free water daily. Goal rate for [facility] 1.4 is 65 ml/h with Prosource TF20 60 ml once daily to provide 2264 kcal, 117 gm protein, 1108 ml free water daily.  Continue supplemental TPN.  Continue dysphagia 1 diet with thin liquids.   Continue Premier Protein shakes brought in by family, each shake provides 160 kcal and 30 gm protein.  Ensure Enlive po TID, each supplement provides 350 kcal and 20 grams of protein.  Prosource Plus 30 ml PO BID, each packet provides 100 kcal and 15 gm protein.  Continue to monitor phos, K, and mag; MD to replete as needed.    NUTRITION DIAGNOSIS:   Severe Malnutrition related to chronic illness (esophageal cancer s/p chemo, radiation and surgery) as evidenced by severe fat depletion, severe muscle depletion, percent weight loss.  Ongoing   GOAL:   Patient will meet greater than or equal to 90% of their needs  Progressing   MONITOR:   PO intake, Diet advancement, TF tolerance  REASON FOR ASSESSMENT:   Consult New TPN/TNA  ASSESSMENT:   Pt recently discharged from [facility] after complicated admission (3/10-4/1) d/t adenocarcinoma of distal third esophagus s/p chemo and radiation and admitted for scheduled esophagectomy. Returned hours after discharge with c/o chest pain. PMH: DM, HTN, dyslipedemia, CKD, IgG kappa MGUS.  Currently receiving TPN at 60 ml/h. Today's formulation provides 1456 kcal, 84 gm protein daily. This meets 66% of minimum estimated kcal needs and 73% of minimum estimated protein needs.    J-tube was exchanged yesterday and is leaking less around it today per review of progress notes. Beginning trickle TF with Johny Nap standard 1.4 at 10 ml/h today.   Patient remains on a dysphagia 1 diet with thin liquids. Intake of meals  remains minimal with 0-10% meal completions documented. Patient is being offered multiple supplements, including Premier Protein and protein powder brought in by family, as well as Ensure and Prosource Plus.   Labs reviewed. Na 134, K 3.3, phos 2.1 CBG: [phone]  Medications reviewed and include novolog, semglee, flomax, potassium phosphate (for repletion).   Admit weight 77.1 kg Current weight 70.6 kg  Diet Order:   Diet Order             DIET - DYS 1 Room service appropriate? Yes; Fluid consistency: Thin  Diet effective now                   EDUCATION NEEDS:   Education needs have been addressed  Skin:  Incisions: R chest tube Other: MASD around J-tube  Last BM:  4/12 type 7  Height:   Ht Readings from Last 1 Encounters:  08/20/23 5\' 10"  (1.778 m)    Weight:   Wt Readings from Last 1 Encounters:  08/30/23 70.6 kg    BMI:  Body mass index is 22.33 kg/m.  Estimated Nutritional Needs:   Kcal:  2200-2400kcal  Protein:  115-130g  Fluid:  >=2L/day   Barnet Boots RD, LDN, CNSC Contact via secure chat. If unavailable, use group chat "RD Inpatient."

## 2023-08-30 NOTE — Progress Notes (Signed)
 Attempted to instill tpa into purple lumen. 1st dose spilled onto bed. 2nd dose attempted and 1ml went into catheter but unable to instill the other 1ml. Primary RN at bedside. Red lumen flushed and had good blood return. PICC RN's Glenora Laos and [employer] notified.

## 2023-08-30 NOTE — Progress Notes (Signed)
 Changed dressing and pulled PICC out to 2cm per PICC nurse Theresa's instructions. Purple lumen remains occluded and red lumen is easily flushed and has good blood return. Primary RN notified and aware.

## 2023-08-30 NOTE — Progress Notes (Signed)
 Pharmacy note: TPN  The RN called to inform me that the PICC line occluded and needs to be replaced. I Plans are to replace PICC 4/13. There is a still a lumen where the TPN can be run tonight   Plan -Continue TPN as ordered -Stop the TPN when the new PICC has been placed -Pharmacy will reassess in the morning  Baxter Limber, PharmD Clinical Pharmacist **Pharmacist phone directory can now be found on [email or web address] ([email or web address]).  Listed under [facility].

## 2023-08-30 NOTE — Progress Notes (Signed)
 NAME:  Seth Burch, MRN:  [id number], DOB:  [birth year], LOS: 11 ADMISSION DATE:  08/19/2023, CONSULTATION DATE:  4/7 REFERRING MD:  lightfoot, CHIEF COMPLAINT:  post-op care    History of Present Illness:  68 year old male patient who is status post esophagectomy on 07/28/2023 Discharged after 22 days with complicated course due to aspiration pneumonia, respiratory failure, AKI and delirium.  Follow-up swallowing studies were negative for leaks completed antibiotics on 3/27 had been discharged on 4/1 was eating dinner that evening and developed sudden onset of right sided chest discomfort.  He reported to the emergency room at this time showed new tachycardia, chest x-ray with increased opacity at right base white blood cell count 15.7 up from 12,000 and a lactate of 2.8 he was started on IV fluids, administered IV antibiotics, and thoracic surgery was consulted.  Diagnostic imaging was consistent with esophageal leak at the anastomosis site with associated pleural effusion he therefore went to the operating room for video-assisted thoracoscopy with decortication, as well as esophageal stent placement on 4/2.  Postoperative course fairly unremarkable, recovering in the intensive care, receiving tube feeds via J-tube, and dysphagia 1 diet was started.  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  He is status post radiation and chemotherapy January 2025 follow-up PET scan showed substantial improvement in hypermetabolic activity.  Just discharged following robotic assisted esophagectomy and placement of jejunostomy tube 3/10  we could check a left now I do not want to nSignificant Hospital Events: Including procedures, antibiotic start and stop dates in addition  to other pertinent events   Discharged post esophagectomy, readmitted later that day. Initially started on vancomycin and cefepime,  4/2, VATS with decortication and esophageal stent placement  4/3 changed to fluconazole and levofloxacin on  4/4 through 4/6: Had some J-tube leakage.Tolerating dysphagia 1 diet 4/7 pulmonary/critical care asked to evaluate and assist with ICU care culturesFrom just growing moderate lactobacillus species and few Rothia MUCILAGINOSA, change antibiotics to Unasyn  Interim History / Subjective:  Less leaking around J-tube today.   Objective   Blood pressure 121/60, pulse 99, temperature 98.2 F (36.8 C), temperature source Oral, resp. rate (!) 21, height 5\' 10"  (1.778 m), weight 70.6 kg, SpO2 94%.        Intake/Output Summary (Last 24 hours) at 08/30/2023 0904 Last data filed at 08/30/2023 0800 Gross per 24 hour  Intake 1454.96 ml  Output 2520 ml  Net -1065.04 ml   Filed Weights   08/23/23 1524 08/25/23 0600 08/30/23 0500  Weight: 81.3 kg 78.5 kg 70.6 kg    General: Chronically ill appearing man lying in bed in NAD HEENT [place]/AT, eyes anicteric Neuro: Awake, alert, answering questions appropriately. Moving eternities.  CV: S1S2, RRR PULM: breathing comfortably on RA, CTAB GI: soft, NT. Barrier cream, mild erythema around PEG. Mild light yellow/green secretions on bandage.  Extremities:  no c/c/e Skin: mild erythema around  Potassium 3.3 BUN 20 Creatinine 1.53  Micro: 4/2 wound cx> lactobacillus, rothia Fungus culture pending 4/1 [facility] 1/2> lactobacillius 1/4    Resolved Hospital Problem list     Assessment & Plan:   Esophageal leak complicated by right sided empyema, status post video-assisted thoracotomy with decortication and esophageal stent -con't chest tube & wound vac per TCTS -con't broad  antibiotics- vanc & unasyn -con't dysphagia 1 diet, no plans to advance in the near future due to issues with pills getting stuck at  anastamosis -adding back trickle TF today-- Johny Nap at 10cc/h -con't protein oral supplements -TPN -ambulate  J tube leak appreciate IR's management, con't to change dressings around site and barrier cream as needed  Recurrent AKI with mild nonanion gap metabolic acidosis, improving -strict I/O -renally dose meds, avoid nephrotoxic meds  Acute blood loss anemia, anemia of chronic disease -Transfuse for hemoglobin less than 7 or hemodynamically significant bleeding History of diabetes with hyperglycemia -SSI PRN -adding glargine 5 units daily -add TF coverage if needed -goal BG 140-180  History of hypertension -increase Lopressor to 25 mg twice daily  -con't holding norvasc, may be able to restart soon  History of hyperlipidemia -statin  BPH -flomax  Hypokalemia -K+repletion  Hypocalcemia -calcium repletion  Hypophosphatemia -repleted  Esophageal cancer status post esophagectomy as well as radiation and chemo -Follow-up with heme-onc/surgery   Severe protein energy malnutrition -con't TPN -start trickle TF via Jtube -con't enteral intake as tolerated  CKD 2 -strict I/O -renally dose meds, avoid nephrotoxic meds  Plan discussed with primary team and patient updated at bedside.  Best Practice (right click and "Reselect all SmartList Selections" daily)   Diet/type: dysphagia diet (see orders), TF held DVT prophylaxis LMWH Pressure ulcer(s): N/A GI prophylaxis: PPI Lines: N/A, Port-A-Cath Foley:  N/A Code Status:  full code Last date of multidisciplinary goals of care discussion [per primary ]  Critical care time: NA    Joesph Mussel, DO 08/30/23 10:22 AM [place]  For contact information, see Amion. If no response to pager, please call PCCM consult pager. After hours, 7PM- 7AM, please call Elink.

## 2023-08-30 NOTE — Progress Notes (Signed)
 PHARMACY - TOTAL PARENTERAL NUTRITION CONSULT NOTE   Indication:  inability to tolerate enteral nutrition  Patient Measurements: Height: 5\' 10"  (177.8 cm) Weight: 70.6 kg (155 lb 10.3 oz) IBW/kg (Calculated) : 73 TPN AdjBW (KG): 80.6 Body mass index is 22.33 kg/m. Usual Weight: 77.1 kg at admission; patient reports 200 lbs in January  Assessment: 67yoM presenting for chest pain after admission 3/10 - 4/1 for adenocarcinoma s/p esophagectomy and percutaneous J-tube placement. On previous admission, was advanced to goal TF on POD4 (3/14) then thick liquids/dysphagia 2 3/19. Discharged home on 4/1 with dysphagia 3/thin liquid diet. 4/4 Presented back to [facility] for R VATS, EGD, and esophageal stent placement. Was advanced to goal TF on 4/4 that was subsequently held due to J-tube leaking, replaced 4/9 by IR. Tube feeds attempted 4/5, 4/7, 4/10 AM however concern of limited absorption with persistent leaking. J-tube continues to leak 4/11 AM, note of saturated dressing surrounding J-tube site when discussing with patient at bedside.   4/10 KUB showed nonobstructive gas, no sign of ileus. However, patient complaint of significant abdominal distension with tube feeding, however is not experiencing pain. Per RN, bowel movements were possibly unabsorbed tube feeds. Family has brought in protein shakes however pt states does not want to eat significant amount of food due to concern for frequent leaking from J-tube. Primary team concerned at this time for lack of wound healing with lack of nutrition as well. Now s/p successful J-tube exchange 4/11 afternoon, however team anticipates challenges with advancing enteral nutrition to goal. Pharmacy consulted for TPN initiation 4/11.   Glucose / Insulin: hx DM, A1c 7.1; CBGs uncontrolled at 196-270 with 20u SSI usage after TPN start. Electrolytes: Na 134, K 3.3, Cl 101, CO2 22, CoCa ~9.3, Phos 2.1, Mg 2 Renal: Scr 1.53, BUN 20 (stable, appears to be roughly at  baseline) Hepatic: 4/10 Alk Phos up 236, Albumin down 1.5, AST/ALT WNL, Tbili 0.4 Intake / Output; MIVF: 1.1 mL/kg/h UOP (1800 mL), LBM 4/11 x1 (type 7, large; abd distended/hypoactive/tender); Wound vac output 70 mL. Chest tube output 440 mL.  Net -1.9L GI Imaging: 4/10 KUB: nonobstructive gas, no free air, no stool burden GI Surgeries / Procedures:  4/9 VIR J-tube exchange 4/11 repeat J-tube exchange  Central access: PICC 4/11 TPN start date: 4/11  Nutritional Goals: Goal TPN rate is 90 mL/hr (provides 125 g of protein and 2,199 kcals per day) to meet 100% of needs alone *Patient having some oral intake - monitoring  RD Assessment: Estimated Needs Total Energy Estimated Needs: 2200-2400kcal Total Protein Estimated Needs: 115-130g Total Fluid Estimated Needs: >=2L/day  Current Nutrition:  NPO for J-tube exchange Previously on/off tube feeding Premier Protein shakes (each 30g protein; 160 calories) - brought by family  Ensure Enlive TID (each 20g protein; 350 kcal) - x1 4/11 Prosource Plus 30 BID (each 15g protein; 100 kcal) - x1 4/11  Plan:  Continue supplemental TPN at 60 mL/hr at 1800 (providing 83.5 g protein, ~1455 kcal - reduced dextrose) - will plan to increase to goal once stable if still with poor oral intake. Electrolytes in TPN: Na 100 mEq/L, increase K to 35 mEq/L (total 50.4 mEq), Ca 0 mEq/L, Mg 5 mEq/L, and increase Phos to 8 mmol/L (total 11 mmoL). Cl:Ac 1:2  Supplement KPhos 15 mmol IV x1 outside of TPN today. Add standard MVI and trace elements to TPN Add thiamine to TPN per RD request.  Adjust to Resistant SSI from WC to q4h, adjust as needed  CCM  team added Semglee 5 units daily.  Monitor CBGs closely.  Monitor TPN labs on Mon/Thurs, next labs 4/13 AM F/u J-tube utilization, re-trial of TF if no leaking  Lenard Quam, PharmD, BCPS, BCCCP Please refer to [facility] for [facility] numbers 08/30/2023,7:06 AM

## 2023-08-30 NOTE — Progress Notes (Signed)
 Received consult to assess PICC for potential issue. Per primary RN, the purple lumen was able to be flushed but had sluggish blood return and then no blood return. Red lumen infusing without issue with TPN. Unable to flush purple lumen at this time. PICC placed 4/11 at approx 1200. Chest x ray ordered to verify tip placement before administering tpa due to PICC being <30 hours old.

## 2023-08-31 ENCOUNTER — Inpatient Hospital Stay (HOSPITAL_COMMUNITY)

## 2023-08-31 ENCOUNTER — Other Ambulatory Visit: Payer: Self-pay

## 2023-08-31 DIAGNOSIS — K9189 Other postprocedural complications and disorders of digestive system: Secondary | ICD-10-CM | POA: Diagnosis not present

## 2023-08-31 DIAGNOSIS — N1831 Chronic kidney disease, stage 3a: Secondary | ICD-10-CM | POA: Diagnosis not present

## 2023-08-31 DIAGNOSIS — J869 Pyothorax without fistula: Secondary | ICD-10-CM | POA: Diagnosis not present

## 2023-08-31 DIAGNOSIS — N179 Acute kidney failure, unspecified: Secondary | ICD-10-CM | POA: Diagnosis not present

## 2023-08-31 LAB — GLUCOSE, CAPILLARY
Glucose-Capillary: 172 mg/dL — ABNORMAL HIGH (ref 70–99)
Glucose-Capillary: 195 mg/dL — ABNORMAL HIGH (ref 70–99)
Glucose-Capillary: 230 mg/dL — ABNORMAL HIGH (ref 70–99)
Glucose-Capillary: 184 mg/dL — ABNORMAL HIGH (ref 70–99)
Glucose-Capillary: 210 mg/dL — ABNORMAL HIGH (ref 70–99)
Glucose-Capillary: 196 mg/dL — ABNORMAL HIGH (ref 70–99)

## 2023-08-31 LAB — BASIC METABOLIC PANEL WITH GFR
Anion gap: 9 (ref 5–15)
BUN: 22 mg/dL (ref 8–23)
CO2: 24 mmol/L (ref 22–32)
Calcium: 7.3 mg/dL — ABNORMAL LOW (ref 8.9–10.3)
Chloride: 102 mmol/L (ref 98–111)
Creatinine, Ser: 1.35 mg/dL — ABNORMAL HIGH (ref 0.61–1.24)
GFR, Estimated: 58 mL/min — ABNORMAL LOW (ref >60)
Glucose, Bld: 190 mg/dL — ABNORMAL HIGH (ref 70–99)
Potassium: 3.2 mmol/L — ABNORMAL LOW (ref 3.5–5.1)
Sodium: 135 mmol/L (ref 135–145)

## 2023-08-31 LAB — PHOSPHORUS: Phosphorus: 2.3 mg/dL — ABNORMAL LOW (ref 2.5–4.6)

## 2023-08-31 LAB — MAGNESIUM: Magnesium: 1.8 mg/dL (ref 1.7–2.4)

## 2023-08-31 MED ORDER — INSULIN GLARGINE-YFGN 100 UNIT/ML ~~LOC~~ SOLN
7.0000 [IU] | Freq: Every day | SUBCUTANEOUS | Status: DC
  Filled 2023-08-31: qty 0.07

## 2023-08-31 MED ORDER — POTASSIUM CHLORIDE 20 MEQ PO PACK
40.0000 meq | PACK | Freq: Once | ORAL | Status: AC
  Administered 2023-08-31: 40 meq
  Filled 2023-08-31: qty 2

## 2023-08-31 MED ORDER — THIAMINE MONONITRATE 100 MG PO TABS
100.0000 mg | ORAL_TABLET | Freq: Every day | ORAL | Status: DC
  Administered 2023-08-31 – 2023-09-08 (×9): 100 mg
  Filled 2023-08-31 (×9): qty 1

## 2023-08-31 MED ORDER — PANTOPRAZOLE SODIUM 40 MG IV SOLR
40.0000 mg | INTRAVENOUS | Status: DC
  Administered 2023-08-31 – 2023-09-02 (×3): 40 mg via INTRAVENOUS
  Filled 2023-08-31 (×3): qty 10

## 2023-08-31 MED ORDER — KATE FARMS STANDARD 1.4 EN LIQD
1000.0000 mL | ENTERAL | Status: DC
  Administered 2023-08-31: 1000 mL
  Filled 2023-08-31: qty 1000

## 2023-08-31 MED ORDER — MAGNESIUM SULFATE 2 GM/50ML IV SOLN
2.0000 g | Freq: Once | INTRAVENOUS | Status: AC
  Administered 2023-08-31: 2 g via INTRAVENOUS
  Filled 2023-08-31: qty 50

## 2023-08-31 MED ORDER — KATE FARMS STANDARD 1.4 EN LIQD
1000.0000 mL | ENTERAL | Status: DC
  Filled 2023-08-31: qty 1000

## 2023-08-31 MED ORDER — INSULIN GLARGINE-YFGN 100 UNIT/ML ~~LOC~~ SOLN
8.0000 [IU] | Freq: Every day | SUBCUTANEOUS | Status: DC
  Administered 2023-08-31 – 2023-09-01 (×2): 8 [IU] via SUBCUTANEOUS
  Filled 2023-08-31 (×2): qty 0.08

## 2023-08-31 MED ORDER — TRAVASOL 10 % IV SOLN
INTRAVENOUS | Status: AC
  Filled 2023-08-31: qty 1026

## 2023-08-31 MED ORDER — POTASSIUM PHOSPHATES 15 MMOLE/5ML IV SOLN
15.0000 mmol | Freq: Once | INTRAVENOUS | Status: AC
  Administered 2023-08-31: 15 mmol via INTRAVENOUS
  Filled 2023-08-31: qty 5

## 2023-08-31 MED ORDER — CALCIUM GLUCONATE-NACL 1-0.675 GM/50ML-% IV SOLN
1.0000 g | Freq: Once | INTRAVENOUS | Status: AC
  Administered 2023-08-31: 1000 mg via INTRAVENOUS
  Filled 2023-08-31: qty 50

## 2023-08-31 NOTE — Progress Notes (Signed)
 NAME:  Seth Burch, MRN:  [id number], DOB:  Jun 13, 1955, LOS: 12 ADMISSION DATE:  08/19/2023, CONSULTATION DATE:  4/7 REFERRING MD:  lightfoot, CHIEF COMPLAINT:  post-op care    History of Present Illness:  68 year old male patient who is status post esophagectomy on 07/28/2023 Discharged after 22 days with complicated course due to aspiration pneumonia, respiratory failure, AKI and delirium.  Follow-up swallowing studies were negative for leaks completed antibiotics on 3/27 had been discharged on 4/1 was eating dinner that evening and developed sudden onset of right sided chest discomfort.  He reported to the emergency room at this time showed new tachycardia, chest x-ray with increased opacity at right base white blood cell count 15.7 up from 12,000 and a lactate of 2.8 he was started on IV fluids, administered IV antibiotics, and thoracic surgery was consulted.  Diagnostic imaging was consistent with esophageal leak at the anastomosis site with associated pleural effusion he therefore went to the operating room for video-assisted thoracoscopy with decortication, as well as esophageal stent placement on 4/2.  Postoperative course fairly unremarkable, recovering in the intensive care, receiving tube feeds via J-tube, and dysphagia 1 diet was started.  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  He is status post radiation and chemotherapy January 2025 follow-up PET scan showed substantial improvement in hypermetabolic activity.  Just discharged following robotic assisted esophagectomy and placement of jejunostomy tube 3/10  we could check a left now I do not want to nSignificant Hospital Events: Including procedures, antibiotic start and stop dates in addition  to other pertinent events   Discharged post esophagectomy, readmitted later that day. Initially started on vancomycin and cefepime,  4/2, VATS with decortication and esophageal stent placement  4/3 changed to fluconazole and levofloxacin on  4/4 through 4/6: Had some J-tube leakage.Tolerating dysphagia 1 diet 4/7 pulmonary/critical care asked to evaluate and assist with ICU care culturesFrom just growing moderate lactobacillus species and few Rothia MUCILAGINOSA, change antibiotics to Unasyn  Interim History / Subjective:  No change in J tube leaking, no distention with slow TF  Objective   Blood pressure 132/72, pulse (!) 105, temperature 99 F (37.2 C), temperature source Oral, resp. rate 18, height 5\' 10"  (1.778 m), weight 71.4 kg, SpO2 91%.        Intake/Output Summary (Last 24 hours) at 08/31/2023 0943 Last data filed at 08/31/2023 0700 Gross per 24 hour  Intake 2479 ml  Output 2065 ml  Net 414 ml   Filed Weights   08/25/23 0600 08/30/23 0500 08/31/23 0500  Weight: 78.5 kg 70.6 kg 71.4 kg    General: chronically ill appearing man lying in bed in NAD HEENT /AT, eyes anicteric Neuro: awake, alert, moving extremities CV:  S1S2, RRR PULM: breathing comfortably on RA, CTA anteriorly, reduced R lateral breath sounds GI: soft, NT. Minimal erythema around J tube, barrier cream.  Extremities:  no cyanosis or edema Skin: mild erythema, no cyanosis  Potassium 3.2 BUN 22 Creatinine 1.35  Micro: 4/2 wound cx> lactobacillus, rothia Fungus culture pending 4/1 [facility] 1/2> lactobacillius 1/4    Resolved Hospital Problem list     Assessment & Plan:   Esophageal leak complicated by right sided empyema, status post video-assisted thoracotomy with decortication and esophageal stent -con't chest tube per  TCTS-- resent studies-- gram stain/ culture, TG, chylomicrons (send-out), cholesterol -con't broad antibiotics- unasyn, vanc -dysphagia 1 diet, no plans to progress in near  term -slowly progress trickle TF today-- Johny Nap at [employer], advance to 30 in 6 hrs if tolerated -oral protein supplements -TPN; line exchange this afternoon per picc team -needs to walk  J tube leak -appreciate IR's management, con't to change dressings around site and barrier cream as needed. So far looks ok, con't to monitor as TF advanced.   Recurrent AKI with mild nonanion gap metabolic acidosis, improving -strict I/O -renally dose meds, avoid nephrotoxic meds  Acute blood loss anemia, anemia of chronic disease -Transfuse for hemoglobin less than 7 or hemodynamically significant bleeding History of diabetes with hyperglycemia -SSI PRN -goal BG 140-180 -increase glargine to 8 units daily -goal BG 140-180  History of hypertension -con't Lopressor to 25 mg twice daily  -con't holding amlodipine  History of hyperlipidemia -con't statin  BPH -con't flomax  Hypokalemia -K+repletion  Hypocalcemia -repleted  Hypophosphatemia -repleted  Esophageal cancer status post esophagectomy as well as radiation and chemo -Follow-up with heme-onc/surgery   Severe protein energy malnutrition -con't TPN; increase TF, con't oral protein supplements   CKD 2 -strict I/O -renally dose meds, avoid nephrotoxic meds  Family updated at bedside with Dr. Honey Lusty.  Best Practice (right click and "Reselect all SmartList Selections" daily)   Diet/type: dysphagia diet (see orders), TF held DVT prophylaxis LMWH Pressure ulcer(s): N/A GI prophylaxis: PPI Lines: Central line, Port-A-Cath, picc Foley:  N/A Code Status:  full code Last date of multidisciplinary goals of care discussion [per primary ]  Critical care time: NA    Joesph Mussel, DO 08/31/23 9:59 AM [place] [facility]  For contact information, see Amion. If no response to pager, please call PCCM consult pager. After hours, 7PM- 7AM, please call Elink.

## 2023-08-31 NOTE — Progress Notes (Signed)
 Peripherally Inserted Central Catheter Placement  The IV Nurse has discussed with the patient and/or persons authorized to consent for the patient, the purpose of this procedure and the potential benefits and risks involved with this procedure.  The benefits include less needle sticks, lab draws from the catheter, and the patient may be discharged home with the catheter. Risks include, but not limited to, infection, bleeding, blood clot (thrombus formation), and puncture of an artery; nerve damage and irregular heartbeat and possibility to perform a PICC exchange if needed/ordered by physician.  Alternatives to this procedure were also discussed.  Bard Power PICC patient education guide, fact sheet on infection prevention and patient information card has been provided to patient /or left at bedside.    PICC Placement Documentation  PICC Double Lumen 08/31/23 Right Basilic 42 cm 0 cm (Active)  Indication for Insertion or Continuance of Line Vasoactive infusions;Administration of hyperosmolar/irritating solutions (i.e. TPN, Vancomycin, etc.) 08/31/23 1723  Exposed Catheter (cm) 0 cm 08/31/23 1723  Site Assessment Clean, Dry, Intact 08/31/23 1723  Lumen #1 Status Flushed;Saline locked;Blood return noted 08/31/23 1723  Lumen #2 Status Flushed;Saline locked;Blood return noted 08/31/23 1723  Dressing Type Transparent;Securing device 08/31/23 1723  Dressing Status Antimicrobial disc/dressing in place;Clean, Dry, Intact 08/31/23 1723  Line Care Connections checked and tightened 08/31/23 1723  Line Adjustment (NICU/IV Team Only) No 08/31/23 1723  Dressing Intervention New dressing;Adhesive placed at insertion site (IV team only);Adhesive placed around edges of dressing (IV team/ICU RN only) 08/31/23 1723  Dressing Change Due 09/07/23 08/31/23 1723       Bright, Spielmann 08/31/2023, 5:25 PM

## 2023-08-31 NOTE — Progress Notes (Signed)
 PHARMACY - TOTAL PARENTERAL NUTRITION CONSULT NOTE   Indication:  inability to tolerate enteral nutrition  Patient Measurements: Height: 5\' 10"  (177.8 cm) Weight: 71.4 kg (157 lb 6.5 oz) IBW/kg (Calculated) : 73 TPN AdjBW (KG): 80.6 Body mass index is 22.59 kg/m. Usual Weight: 77.1 kg at admission; patient reports 200 lbs in January  Assessment: 67yoM presenting for chest pain after admission 3/10 - 4/1 for adenocarcinoma s/p esophagectomy and percutaneous J-tube placement. On previous admission, was advanced to goal TF on POD4 (3/14) then thick liquids/dysphagia 2 3/19. Discharged home on 4/1 with dysphagia 3/thin liquid diet. 4/4 Presented back to [facility] for R VATS, EGD, and esophageal stent placement. Was advanced to goal TF on 4/4 that was subsequently held due to J-tube leaking, replaced 4/9 by IR. Tube feeds attempted 4/5, 4/7, 4/10 AM however concern of limited absorption with persistent leaking. J-tube continues to leak 4/11 AM, note of saturated dressing surrounding J-tube site when discussing with patient at bedside.   4/10 KUB showed nonobstructive gas, no sign of ileus. However, patient complaint of significant abdominal distension with tube feeding, however is not experiencing pain. Per RN, bowel movements were possibly unabsorbed tube feeds. Family has brought in protein shakes however pt states does not want to eat significant amount of food due to concern for frequent leaking from J-tube. Primary team concerned at this time for lack of wound healing with lack of nutrition as well. Now s/p successful J-tube exchange 4/11 afternoon, however team anticipates challenges with advancing enteral nutrition to goal. Pharmacy consulted for TPN initiation 4/11.   Glucose / Insulin: hx DM, A1c 7.1; CBGs trending down, 118-190 with start of Semglee 5 units + 21u SSI usage. Electrolytes: Na 135, K 3.2, CoCa ~9.3, Phos 2.3, Mg 1.8 Renal: Scr 1.35, BUN 22 (stable, appears to be roughly at  baseline) Hepatic: 4/10 Alk Phos up 236, Albumin down 1.5, AST/ALT WNL, Tbili 0.4 Intake / Output; MIVF: 1.1 mL/kg/h UOP (1800 mL), LBM 4/11 x1 (type 7, large; abd distended/hypoactive/tender); Wound vac output 70 mL. Chest tube output 440 mL.  Net -1.9L GI Imaging: 4/10 KUB: nonobstructive gas, no free air, no stool burden GI Surgeries / Procedures:  4/9 VIR J-tube exchange 4/11 repeat J-tube exchange  Central access: PICC 4/11 TPN start date: 4/11  Nutritional Goals: Goal TPN rate is 95 mL/hr (provides 125 g of protein and 2200 kcals per day) to meet 100% of needs alone *Patient having some oral intake - monitoring  RD Assessment: Estimated Needs Total Energy Estimated Needs: 2200-2400kcal Total Protein Estimated Needs: 115-130g Total Fluid Estimated Needs: >=2L/day  Current Nutrition:  Dysphagia 1 w/ thin liquids - very poor intake per RN [employer] 1.4 TF at 10 ml/hr (336 kcal, 15g protein) Premier Protein shakes (each 30g protein; 160 calories) - brought by family (amount not charted - but <half of each if takes) Ensure Enlive TID (each 20g protein; 350 kcal) - 0 intake 4/12 Prosource Plus 30 BID (each 15g protein; 100 kcal) - x1 4/12  Plan:  Increase supplemental TPN to 75 mL/hr at 1800 (providing 103 g protein, ~1794 kcal) -will continue to monitor enteral intake and consider going to full if unable to increase TF/enteral intake.  Electrolytes in TPN: Na 100 mEq/L, increase K to 38 mEq/L (total ~68 mEq), Ca 2.5 mEq/L, increase Mg to 7 mEq/L (total ~13 mEq), and increase Phos to 10 mmol/L (total ~16 mmoL).  Continue Cl:Ac 1:2. Supplement KPhos 15 mmol IV x1, KCl 40mEq, and Magnesium  2g IV x1 outside of TPN today. Add standard MVI and trace elements to TPN Change Thiamine to per tube and continue per RD request -day 2.  Adjust to Resistant SSI from WC to q4h, adjust as needed  CCM team adjusting Semglee.  Monitor CBGs closely.  Monitor TPN labs on Mon/Thurs, next labs  4/13 AM F/u J-tube utilization, re-trial of TF if no leaking  Lenard Quam, PharmD, BCPS, BCCCP Please refer to [facility] for [facility] numbers 08/31/2023,7:07 AM

## 2023-08-31 NOTE — Progress Notes (Signed)
 [address]       [place] [address]             [phone]      11 Days Post-Op  Procedure(s) (LRB): VIDEO ASSISTED THORACOSCOPY (VATS)/DECORTICATION (Right) EGD (ESOPHAGOGASTRODUODENOSCOPY) (N/A)   Total Length of Stay:  LOS: 12 days    SUBJECTIVE: No increased abd distension on tube feeds Continues to be frustrated and family had multiple concerns expressed Vitals:   08/31/23 0600 08/31/23 0800  BP: 132/72   Pulse: (!) 105   Resp: 18   Temp:  99 F (37.2 C)  SpO2: 91%     Intake/Output      04/12 0701 04/13 0700 04/13 0701 04/14 0700   P.O. 236    I.V. (mL/kg) 1297.1 (18.2)    NG/GT 171.8    IV Piggyback 774.1    Total Intake(mL/kg) 2479 (34.7)    Urine (mL/kg/hr) 1900 (1.1)    Drains 50    Stool 0    Chest Tube 335    Total Output 2285    Net +194         Urine Occurrence 1 x    Stool Occurrence 1 x        ampicillin-sulbactam (UNASYN) IV 3 g (08/31/23 0756)   feeding supplement (KATE [employer]ARMS STANDARD ENT 1.4) 10 mL/hr at 08/31/23 0600   magnesium sulfate bolus IVPB     potassium PHOSPHATE IVPB (in mmol)     TPN ADULT (ION) 60 mL/hr at 08/31/23 0600   vancomycin Stopped (08/30/23 1154)    CBC    Component Value Date/Time   WBC 7.7 08/29/2023 0418   RBC 2.80 (L) 08/29/2023 0418   HGB 8.3 (L) 08/29/2023 0418   HGB 12.4 (L) 07/03/2023 1105   HCT 25.9 (L) 08/29/2023 0418   PLT 336 08/29/2023 0418   PLT 195 07/03/2023 1105   MCV 92.5 08/29/2023 0418   MCH 29.6 08/29/2023 0418   MCHC 32.0 08/29/2023 0418   RDW 15.4 08/29/2023 0418   LYMPHSABS 0.8 08/28/2023 0646   MONOABS 1.0 08/28/2023 0646   EOSABS 0.1 08/28/2023 0646   BASOSABS 0.0 08/28/2023 0646   CMP     Component Value Date/Time   NA 135 08/31/2023 0358   K 3.2 (L) 08/31/2023 0358   CL 102 08/31/2023 0358   CO2 24 08/31/2023 0358   GLUCOSE 190 (H) 08/31/2023 0358   BUN 22 08/31/2023 0358   CREATININE 1.35 (H) 08/31/2023 0358   CREATININE 1.35 (H)  07/03/2023 1105   CALCIUM 7.3 (L) 08/31/2023 0358   PROT 5.1 (L) 08/30/2023 0440   ALBUMIN 1.5 (L) 08/30/2023 0440   AST 24 08/30/2023 0440   AST 28 07/03/2023 1105   ALT 18 08/30/2023 0440   ALT 23 07/03/2023 1105   ALKPHOS 236 (H) 08/30/2023 0440   BILITOT 0.4 08/30/2023 0440   BILITOT 0.6 07/03/2023 1105   GFRNONAA 58 (L) 08/31/2023 0358   GFRNONAA 58 (L) 07/03/2023 1105   ABG    Component Value Date/Time   PHART 7.4 07/29/2023 0514   PCO2ART 36 07/29/2023 0514   PO2ART 70 (L) 07/29/2023 0514   HCO3 25.8 08/02/2023 2006   TCO2 17 (L) 08/19/2023 2022   ACIDBASEDEF 1.0 08/02/2023 1506   O2SAT 94.5 08/02/2023 2006   CBG (last 3)  Recent Labs    08/30/23 2340 08/31/23 0356 08/31/23 0757  GLUCAP 148* 172* 195*  EXAM Looks good Abd: soft Ext; warm Creamy  chest tube output along with wound vac   ASSESSMENT: SP Esophagectomy Tolerating tube feeds will advance Tolerating po diet dysphagia 1  Will send chest tube output for culture and triglycerides Continue TPN   Melene Sportsman, MD 08/31/2023

## 2023-08-31 NOTE — Progress Notes (Signed)
 Requested to have PICC line exchanged later in the shift today to minimize waste of TNA.  Requested by Isa Manuel RN and Dr Fulton Job.  Secure chat sent to Isa Manuel RN so will be able to contact if needs change throughout the shift.

## 2023-09-01 DIAGNOSIS — E46 Unspecified protein-calorie malnutrition: Secondary | ICD-10-CM | POA: Diagnosis not present

## 2023-09-01 DIAGNOSIS — E785 Hyperlipidemia, unspecified: Secondary | ICD-10-CM

## 2023-09-01 DIAGNOSIS — J869 Pyothorax without fistula: Secondary | ICD-10-CM | POA: Diagnosis not present

## 2023-09-01 DIAGNOSIS — N179 Acute kidney failure, unspecified: Secondary | ICD-10-CM | POA: Diagnosis not present

## 2023-09-01 DIAGNOSIS — N182 Chronic kidney disease, stage 2 (mild): Secondary | ICD-10-CM

## 2023-09-01 DIAGNOSIS — K9189 Other postprocedural complications and disorders of digestive system: Secondary | ICD-10-CM | POA: Diagnosis not present

## 2023-09-01 LAB — GLUCOSE, CAPILLARY
Glucose-Capillary: 160 mg/dL — ABNORMAL HIGH (ref 70–99)
Glucose-Capillary: 204 mg/dL — ABNORMAL HIGH (ref 70–99)
Glucose-Capillary: 210 mg/dL — ABNORMAL HIGH (ref 70–99)
Glucose-Capillary: 214 mg/dL — ABNORMAL HIGH (ref 70–99)
Glucose-Capillary: 216 mg/dL — ABNORMAL HIGH (ref 70–99)
Glucose-Capillary: 230 mg/dL — ABNORMAL HIGH (ref 70–99)

## 2023-09-01 LAB — COMPREHENSIVE METABOLIC PANEL WITH GFR
ALT: 20 U/L (ref 0–44)
AST: 29 U/L (ref 15–41)
Albumin: <1.5 g/dL — ABNORMAL LOW (ref 3.5–5.0)
Alkaline Phosphatase: 228 U/L — ABNORMAL HIGH (ref 38–126)
Anion gap: 8 (ref 5–15)
BUN: 24 mg/dL — ABNORMAL HIGH (ref 8–23)
CO2: 24 mmol/L (ref 22–32)
Calcium: 7.1 mg/dL — ABNORMAL LOW (ref 8.9–10.3)
Chloride: 103 mmol/L (ref 98–111)
Creatinine, Ser: 1.38 mg/dL — ABNORMAL HIGH (ref 0.61–1.24)
GFR, Estimated: 56 mL/min — ABNORMAL LOW (ref >60)
Glucose, Bld: 231 mg/dL — ABNORMAL HIGH (ref 70–99)
Potassium: 3.8 mmol/L (ref 3.5–5.1)
Sodium: 135 mmol/L (ref 135–145)
Total Bilirubin: 0.5 mg/dL (ref 0.0–1.2)
Total Protein: 5 g/dL — ABNORMAL LOW (ref 6.5–8.1)

## 2023-09-01 LAB — TRIGLYCERIDES: Triglycerides: 73 mg/dL (ref <150)

## 2023-09-01 LAB — PHOSPHORUS: Phosphorus: 2.1 mg/dL — ABNORMAL LOW (ref 2.5–4.6)

## 2023-09-01 LAB — MAGNESIUM: Magnesium: 2 mg/dL (ref 1.7–2.4)

## 2023-09-01 MED ORDER — KATE FARMS STANDARD 1.4 EN LIQD
1000.0000 mL | ENTERAL | Status: DC
  Administered 2023-09-01 – 2023-09-04 (×4): 1000 mL
  Filled 2023-09-01 (×5): qty 1000

## 2023-09-01 MED ORDER — TRAVASOL 10 % IV SOLN
INTRAVENOUS | Status: AC
  Filled 2023-09-01: qty 1026

## 2023-09-01 MED ORDER — KATE FARMS STANDARD 1.4 EN LIQD
1000.0000 mL | ENTERAL | Status: DC
  Administered 2023-09-01: 1000 mL
  Filled 2023-09-01: qty 1000

## 2023-09-01 MED ORDER — POTASSIUM & SODIUM PHOSPHATES 280-160-250 MG PO PACK
2.0000 | PACK | ORAL | Status: AC
  Administered 2023-09-01 (×2): 2
  Filled 2023-09-01 (×2): qty 2

## 2023-09-01 MED ORDER — POTASSIUM & SODIUM PHOSPHATES 280-160-250 MG PO PACK
2.0000 | PACK | ORAL | Status: DC
  Filled 2023-09-01 (×2): qty 2

## 2023-09-01 MED ORDER — INSULIN ASPART 100 UNIT/ML IJ SOLN
3.0000 [IU] | INTRAMUSCULAR | Status: DC
  Administered 2023-09-01 – 2023-09-15 (×74): 3 [IU] via SUBCUTANEOUS

## 2023-09-01 MED ORDER — INSULIN GLARGINE-YFGN 100 UNIT/ML ~~LOC~~ SOLN
10.0000 [IU] | Freq: Every day | SUBCUTANEOUS | Status: DC
  Filled 2023-09-01: qty 0.1

## 2023-09-01 NOTE — Progress Notes (Signed)
 PHARMACY - TOTAL PARENTERAL NUTRITION CONSULT NOTE   Indication:  inability to tolerate enteral nutrition  Patient Measurements: Height: 5\' 10"  (177.8 cm) Weight: 74 kg (163 lb 2.3 oz) IBW/kg (Calculated) : 73 TPN AdjBW (KG): 80.6 Body mass index is 23.41 kg/m. Usual Weight: 77.1 kg at admission; patient reports 200 lbs in January  Assessment: 67yoM presenting for chest pain after admission 3/10 - 4/1 for adenocarcinoma s/p esophagectomy and percutaneous J-tube placement. On previous admission, was advanced to goal TF on POD4 (3/14) then thick liquids/dysphagia 2 3/19. Discharged home on 4/1 with dysphagia 3/thin liquid diet. 4/4 Presented back to [facility] for R VATS, EGD, and esophageal stent placement. Was advanced to goal TF on 4/4 that was subsequently held due to J-tube leaking, replaced 4/9 by IR. Tube feeds attempted 4/5, 4/7, 4/10 AM however concern of limited absorption with persistent leaking. J-tube continues to leak 4/11 AM, note of saturated dressing surrounding J-tube site when discussing with patient at bedside.   4/10 KUB showed nonobstructive gas, no sign of ileus. However, patient complaint of significant abdominal distension with tube feeding, however is not experiencing pain. Per RN, bowel movements were possibly unabsorbed tube feeds. Family has brought in protein shakes however pt states does not want to eat significant amount of food due to concern for frequent leaking from J-tube. Primary team concerned at this time for lack of wound healing with lack of nutrition as well. Now s/p successful J-tube exchange 4/11 afternoon, however team anticipates challenges with advancing enteral nutrition to goal. Pharmacy consulted for TPN initiation 4/11.   Glucose / Insulin: hx DM, A1c 7.1; CBGs trending down, 184 - 230 with Semglee 8 units + 33 u SSI Electrolytes: Na 135, K 3.8 (s/p 40 mEq PO, ~20 mEq from Kphos), CoCa ~9.1, Phos 2.1 (from 2.3, s/p 15 mmol), Mg 2 (from 1.8, s/p 2g) Renal:  Scr 1.38, BUN 24 (stable, appears to be roughly at baseline) Hepatic: Alk Phos 228, Albumin < 1.5, AST/ALT WNL, Tbili 0.5 Intake / Output; MIVF: 0.9 mL/kg/h UOP (1625 mL), LBM 4/13 x2 (type 7, small-medium); Wound vac output 175 mL. Chest tube output 225 mL.  Net +0.5L GI Imaging: 4/10 KUB: nonobstructive gas, no free air, no stool burden GI Surgeries / Procedures:  4/9 VIR J-tube exchange 4/11 repeat J-tube exchange  Central access: PICC 4/11 TPN start date: 4/11  Nutritional Goals: Goal TPN rate is 95 mL/hr (provides 125 g of protein and 2200 kcals per day) to meet 100% of needs alone *Patient having some oral intake - monitoring  RD Assessment: Estimated Needs Total Energy Estimated Needs: 2200-2400kcal Total Protein Estimated Needs: 115-130g Total Fluid Estimated Needs: >=2L/day  Current Nutrition:  Dysphagia 1 w/ thin liquids - very poor intake per RN [employer] 1.4 TF at 30 ml/hr (1008 kCal, 45 g protein) Premier Protein shakes (each 30 g protein; 160 calories) - brought by family (amount not charted - but <half of each if takes) Ensure Enlive TID (each 20g protein; 350 kcal) - 0 intake 4/12 Prosource Plus 30 BID (each 15g protein; 100 kcal) - x1 4/12  Plan:  Continue supplemental TPN at 75 mL/hr (providing 103 g protein, ~1794 kcal) -will continue to monitor enteral intake and consider going to full if unable to maintain TF/enteral intake.  Electrolytes in TPN: Na 110 mEq/L, K 38 mEq/L (total ~68 mEq), Ca 2.5 mEq/L, Mg 7 mEq/L (total ~13 mEq), and increase Phos to 15 mmol/L. Max Acetate - discontinue PO bicarb tabs Supplement  2 PhosNaK packets x2 outside of TPN today. Add standard MVI and trace elements to TPN Continue thiamine per tube per RD request - (started 4/12) Semglee increased to 10 units daily per CCM, continue resistant SSI q4h Add TF coverage 3u q4h Monitor TPN labs on Mon/Thurs, next labs 4/15 AM No further leaking from J-tube, f/u continued advancement of  TF  Heddy Liverpool, PharmD PGY2 Critical Care Pharmacy Resident 09/01/2023,7:50 AM

## 2023-09-01 NOTE — Progress Notes (Signed)
 NAME:  Seth Burch, MRN:  [id number], DOB:  09-12-1955, LOS: 13 ADMISSION DATE:  08/19/2023, CONSULTATION DATE:  4/7 REFERRING MD:  lightfoot, CHIEF COMPLAINT:  post-op care    History of Present Illness:  68 year old male patient who is status post esophagectomy on 07/28/2023 Discharged after 22 days with complicated course due to aspiration pneumonia, respiratory failure, AKI and delirium.  Follow-up swallowing studies were negative for leaks completed antibiotics on 3/27 had been discharged on 4/1 was eating dinner that evening and developed sudden onset of right sided chest discomfort.  He reported to the emergency room at this time showed new tachycardia, chest x-ray with increased opacity at right base white blood cell count 15.7 up from 12,000 and a lactate of 2.8 he was started on IV fluids, administered IV antibiotics, and thoracic surgery was consulted.  Diagnostic imaging was consistent with esophageal leak at the anastomosis site with associated pleural effusion he therefore went to the operating room for video-assisted thoracoscopy with decortication, as well as esophageal stent placement on 4/2.  Postoperative course fairly unremarkable, recovering in the intensive care, receiving tube feeds via J-tube, and dysphagia 1 diet was started.  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  He is status post radiation and chemotherapy January 2025 follow-up PET scan showed substantial improvement in hypermetabolic activity.  Just discharged following robotic assisted esophagectomy and placement of jejunostomy tube 3/10  we could check a left now I do not want to nSignificant Hospital Events: Including procedures, antibiotic start and stop dates in addition  to other pertinent events   Discharged post esophagectomy, readmitted later that day. Initially started on vancomycin and cefepime,  4/2, VATS with decortication and esophageal stent placement  4/3 changed to fluconazole and levofloxacin on  4/4 through 4/6: Had some J-tube leakage.Tolerating dysphagia 1 diet 4/7 pulmonary/critical care asked to evaluate and assist with ICU care culturesFrom just growing moderate lactobacillus species and few Rothia MUCILAGINOSA, change antibiotics to Unasyn 4/14 increasing EN   Interim History / Subjective:   Not eating much.  Adamant that the food is deeply offensive to him Taste is offensively bad So offensive he is not inclined to eat it   When he does have bites of something like applesauce he is displeased with how it feels in his mouth & how swallowing feels different vs pre-interventions.   Objective   Blood pressure (!) 140/78, pulse (!) 110, temperature 99.8 F (37.7 C), temperature source Oral, resp. rate 19, height 5\' 10"  (1.778 m), weight 74 kg, SpO2 91%.        Intake/Output Summary (Last 24 hours) at 09/01/2023 1212 Last data filed at 09/01/2023 1200 Gross per 24 hour  Intake 3315.76 ml  Output 2035 ml  Net 1280.76 ml   Filed Weights   08/30/23 0500 08/31/23 0500 09/01/23 0400  Weight: 70.6 kg 71.4 kg 74 kg    General: Ill appearing M  HEENT NCAT  Neuro: AAOx3  CV:  rr s1s2  PULM: R chest tube with white output  GI: soft. J tube with some leaking around insertion -- green tinged  Extremities:  no acute joint deformity  Skin: would vac w white output     Micro: 4/2 wound cx> lactobacillus, rothia Fungus culture pending 4/1 [facility]  1/2> lactobacillius 1/4  4/13 pleural cx -- GPC, GPR   Resolved Hospital Problem list     Assessment & Plan:   Distal esophageal adenocarcinoma s/p esophagectomy 3/10 Esophageal leak + R empyema s/p VATS decort + esophageal stent 4/2  P -cont unasyn vanc --wouldn't narrow until cx result   -follow pleural cx  -- prelim pleural fluid w, GPC GPR and WBC  - f/u send outs--trigs, chylomicrons   Inadequate PO intake Malnutrition  J tube leak  -Dys 1 diet + EN via J tube + TPN  -adv EN 4/14   AKI on CKD2 Hypokalemia Hypophosphatemia  -follow BMP, lytes. Replace PRN    AoC anemia  P -PRN CBC   DM2 w hyperglycemia P -rSSI -incr semglee to 10u   HTN HLD -BID metop, holding amlodipine  -statin   BPH -con't flomax   Best Practice (right click and "Reselect all SmartList Selections" daily)   Diet/type: dysphagia diet (see orders), TF held DVT prophylaxis LMWH Pressure ulcer(s): N/A GI prophylaxis: PPI Lines: Central line, Port-A-Cath, picc Foley:  N/A Code Status:  full code Last date of multidisciplinary goals of care discussion [per primary ]  Critical care time: NA    Moderate MDM   Eston Hence MSN, AGACNP-BC [place] [facility] Amion for pager  09/01/2023, 12:12 PM

## 2023-09-01 NOTE — Progress Notes (Signed)
      [address]       [name] [address]             [phone]      12 Days Post-Op Procedure(s) (LRB): VIDEO ASSISTED THORACOSCOPY (VATS)/DECORTICATION (Right) EGD (ESOPHAGOGASTRODUODENOSCOPY) (N/A) Subjective: Awake, sitting up in bed.  Denies pain or nausea.   TPN infusing via PICC at 41ml/hr TF via J-tube at 64ml/hr  Having bowel movements.  Objective: Vital signs in last 24 hours: Temp:  [98.8 F (37.1 C)-99.7 F (37.6 C)] 98.8 F (37.1 C) (04/14 0430) Pulse Rate:  [84-101] 100 (04/14 0700) Cardiac Rhythm: Normal sinus rhythm (04/13 2000) Resp:  [14-30] 22 (04/14 0700) BP: (119-148)/(66-96) 143/74 (04/14 0700) SpO2:  [90 %-92 %] 91 % (04/14 0700) Weight:  [74 kg] 74 kg (04/14 0400)     Intake/Output from previous day: 04/13 0701 - 04/14 0700 In: 3625.8 [P.O.:480; I.V.:1473; NG/GT:594.2; IV Piggyback:1078.6] Out: 1850 [Urine:1475; Drains:175; Chest Tube:200] Intake/Output this shift: Total I/O In: -  Out: 135 [Drains:25; Chest Tube:110]  General appearance: alert, cooperative, and mild distress Neurologic: intact Heart: RRR, monitor showing NSR. Lungs: clear to auscultation bilaterally Abdomen: soft, flat, expected mild tenderness around J-tube insertion site. O/W benign.  Wound: Dressing removed from the J-tube site this morning had pale, green staining.  The pleural tube and wound vac both have opaque white drainage.  Lab Results: No results for input(s): "WBC", "HGB", "HCT", "PLT" in the last 72 hours. BMET:  Recent Labs    08/31/23 0358 09/01/23 0436  NA 135 135  K 3.2* 3.8  CL 102 103  CO2 24 24  GLUCOSE 190* 231*  BUN 22 24*  CREATININE 1.35* 1.38*  CALCIUM 7.3* 7.1*    PT/INR: No results for input(s): "LABPROT", "INR" in the last 72 hours. ABG    Component Value Date/Time   PHART 7.4 07/29/2023 0514   HCO3 25.8 08/02/2023 2006   TCO2 17 (L) 08/19/2023 2022   ACIDBASEDEF 1.0 08/02/2023 1506   O2SAT 94.5  08/02/2023 2006   CBG (last 3)  Recent Labs    08/31/23 2031 08/31/23 2315 09/01/23 0431  GLUCAP 210* 196* 230*    Assessment/Plan: S/P Procedure(s) (LRB): VIDEO ASSISTED THORACOSCOPY (VATS)/DECORTICATION (Right) EGD (ESOPHAGOGASTRODUODENOSCOPY) (N/A)  -S/P robotic-assisted esophagectomy 07/28/23 for distal esophageal adenocarcinoma with subsequent anastomotic leak managed with right VATS decortication, EGD with esophageal stenting on 4/2.  Currently tolerating D1 diet but oral intake not adequate for needs.  Supported with TPN at 51ml/hr and TF infusing via J-tube at 13ml/hr. Having less leakage since J-tube exchanged over a wire by IR last week. Hope to advance TF over next few days.  -ID- Currently on Unasyn and Vanc IV.  New cultures on pleural fluid sent yesterday along with fluid triglycerides and chylomicrons (pending).  The Grams stain shows few Gm+ cocci, rare Gm+ rods, abundant WBCs.  PICC line exchanged yesterday by IV team.   -RENAL:  CKD-  creat near baseline, UO adequate. Monitoring.  H/O BPH, on Flomax  -DVT PPX- on daily enoxaparin, continue to mobilize as able. PT/OT on board.   -ENDO- CBG's 180's - 230. On Semglee and SSI.    -Dispo- continue ICU care while awaiting pleural fluid cultures and studies to r/o chylothorax.   LOS: 13 days    Leary Roca, PA-C 09/01/2023

## 2023-09-01 NOTE — Progress Notes (Signed)
 Physical Therapy Treatment Patient Details Name: Seth Burch MRN: [id number] DOB: [birth year] Today's Date: 09/01/2023   History of Present Illness Pt is a 68 y/o male presenting 4/1 due to acute onset of R chest pain after eating. Pt noted with AKI. 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 4/2 R VATS, EGD, esophageal stent placement, and chest tube placement. 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 PMHx: HTN, DM    PT Comments  Patient reluctant but ultimately agrees to participate with PT. He continues to require min assist for supine to sit, however transfers and ambulates with supervision and +2 for multiple lines. Patient is interested in progressing to not needing an assistive device--will continue to work towards this. Currently has so many lines/tubes that he will need +2 for lines as one person assists him with ambulation.    If plan is discharge home, recommend the following: Assistance with cooking/housework;Assist for transportation;Help with stairs or ramp for entrance;A little help with walking and/or transfers;A little help with bathing/dressing/bathroom   Can travel by private vehicle        Equipment Recommendations  None recommended by PT    Recommendations for Other Services       Precautions / Restrictions Precautions Precautions: Fall;Other (comment) Precaution/Restrictions Comments: jejunostomy, R chest tube Restrictions Weight Bearing Restrictions Per Provider Order: No     Mobility  Bed Mobility Overal bed mobility: Needs Assistance Bed Mobility: Supine to Sit     Supine to sit: Min assist, HOB elevated     General bed mobility comments: HHA to raise torso    Transfers Overall transfer level: Needs assistance Equipment used: Rolling  walker (2 wheels) Transfers: Sit to/from Stand Sit to Stand: Supervision           General transfer comment: for safety/lines    Ambulation/Gait Ambulation/Gait assistance: Supervision Gait Distance (Feet): 370 Feet Assistive device: Rolling walker (2 wheels) Gait Pattern/deviations: WFL(Within Functional Limits) Gait velocity: decr     General Gait Details: moving well; anticipate progress to no assistive device   Stairs             Wheelchair Mobility     Tilt Bed    Modified Rankin (Stroke Patients Only)       Balance Overall balance assessment: Needs assistance Sitting-balance support: No upper extremity supported, Feet supported Sitting balance-Leahy Scale: Good Sitting balance - Comments: remained sitting EOB with RN at end of session (to bathe)   Standing balance support: No upper extremity supported Standing balance-Leahy Scale: Fair Standing balance comment: able to stand statically with no AD, RW during gait                            Communication Communication Communication: No apparent difficulties  Cognition Arousal: Alert Behavior During Therapy: Flat affect (clearly frustrated)   PT - Cognitive impairments: No apparent impairments                         Following commands: Intact      Cueing Cueing Techniques: Verbal cues  Exercises Other Exercises Other Exercises: on return to room, RN set up for pt to bathe, therefore no exercises    General Comments        Pertinent Vitals/Pain Pain Assessment Pain Assessment:  Faces Faces Pain Scale: Hurts a little bit Pain Location: R chest tube site Pain Descriptors / Indicators: Discomfort Pain Intervention(s): Limited activity within patient's tolerance    Home Living Family/patient expects to be discharged to:: Private residence Living Arrangements: Spouse/significant other Available Help at Discharge: Family;Available 24 hours/day Type of Home:  House Home Access: Stairs to enter Entrance Stairs-Rails: Right;Left;Can reach both Entrance Stairs-Number of Steps: 4   Home Layout: One level Home Equipment: Pharmacist, hospital (2 wheels);BSC/3in1      Prior Function            PT Goals (current goals can now be found in the care plan section) Acute Rehab PT Goals Patient Stated Goal: to need less assistance with mobility Time For Goal Achievement: 09/04/23 Potential to Achieve Goals: Good Progress towards PT goals: Progressing toward goals    Frequency    Min 2X/week      PT Plan      Co-evaluation              AM-PAC PT "6 Clicks" Mobility   Outcome Measure  Help needed turning from your back to your side while in a flat bed without using bedrails?: A Little Help needed moving from lying on your back to sitting on the side of a flat bed without using bedrails?: A Little Help needed moving to and from a bed to a chair (including a wheelchair)?: A Little Help needed standing up from a chair using your arms (e.g., wheelchair or bedside chair)?: A Little Help needed to walk in hospital room?: A Little Help needed climbing 3-5 steps with a railing? : A Little 6 Click Score: 18    End of Session   Activity Tolerance: Patient tolerated treatment well Patient left: with call bell/phone within reach;in bed;with nursing/sitter in room Nurse Communication: Mobility status PT Visit Diagnosis: Other abnormalities of gait and mobility (R26.89);Unsteadiness on feet (R26.81)     Time: 8413-2440 PT Time Calculation (min) (ACUTE ONLY): 18 min  Charges:    $Gait Training: 8-22 mins PT General Charges $$ ACUTE PT VISIT: 1 Visit                      Gayle Kava, PT Acute Rehabilitation Services  Office [phone]    Guilford Leep 09/01/2023, 12:23 PM

## 2023-09-01 NOTE — Progress Notes (Signed)
 Pharmacy Antibiotic Note  Seth Burch is a 68 y.o. male admitted on 08/19/2023 with esophagectomy. Pt grew lactobacillus in blood so has been on Unasyn, now with purulent wound. Pharmacy has been consulted for vancomycin dosing.  Was started on 4/11 - WBC 7.7, afebrile. Scr 1.38 (CrCl 54 mL/min). Currently on day #4. Most recent pleural fluid cx on 4/13 in process (few GPC, rare GPR).  Plan: Continue unasyn 3g IV every 6 hr for 14 days total Vancomycin 1250mg  IV q24h  Follow Cr, vancomycin levels PRN Keep vancomycin until pleural cx finalized then consider de-escalation  Height: 5\' 10"  (177.8 cm) Weight: 74 kg (163 lb 2.3 oz) IBW/kg (Calculated) : 73  Temp (24hrs), Avg:99.2 F (37.3 C), Min:98.8 F (37.1 C), Max:99.7 F (37.6 C)  Recent Labs  Lab 08/26/23 0255 08/27/23 0252 08/28/23 0646 08/29/23 0418 08/30/23 0440 08/31/23 0358 09/01/23 0436  WBC 7.4 10.0 9.3 7.7  --   --   --   CREATININE 1.87* 1.75* 1.62* 1.56* 1.53* 1.35* 1.38*    Estimated Creatinine Clearance: 54.4 mL/min (A) (by C-G formula based on SCr of 1.38 mg/dL (H)).    No Known Allergies  Thank you for allowing pharmacy to participate in this patient's care,  Nieves Bars, PharmD, BCCCP Clinical Pharmacist  Phone: [phone] 09/01/2023 1:38 PM  Please check AMION for all [facility] Pharmacy phone numbers After 10:00 PM, call Main Pharmacy [phone]

## 2023-09-01 NOTE — Plan of Care (Signed)
  Problem: Clinical Measurements: Goal: Ability to maintain clinical measurements within normal limits will improve Outcome: Progressing   Problem: Coping: Goal: Level of anxiety will decrease Outcome: Progressing   Problem: Pain Managment: Goal: General experience of comfort will improve and/or be controlled Outcome: Progressing

## 2023-09-01 NOTE — Progress Notes (Addendum)
 Nutrition Follow-up  DOCUMENTATION CODES:  Severe malnutrition in context of chronic illness  INTERVENTION:  Continue supplemental TPN - currently providing 82% estimated calorie needs and  90% estimated protein needs @75ml /hr Continue Premier Protein shakes as tolerated, brought in by family. Each shake has 30g of protein but only 160 calories.  Monitor diet advancement and tolerance Continue to encourage addition of patient purchased unflavored protein powder to low protein food items Discontinue Ensure Enlive po TID due to refusals 30 ml ProSource Plus BID, each supplement provides 100 kcals and 15 grams protein.   Continue 30 ml ProSource Plus BID, each supplement provides 100 kcals and 15 grams protein.   Continue to monitor electrolytes and replete as needed  Continue tube feeding via J-tube: [employer] Peptide 1.5 at 60 ml/h (1440 ml per day) Increase to 40ml/hr and by 26ml/hr q12 hours thereafter  Provides 2160 kcal (98% estimated needs), 107 gm protein (93% estimated needs), 1008 ml free water daily (50% estimated needs)   NUTRITION DIAGNOSIS:  Severe Malnutrition related to chronic illness (esophageal cancer s/p chemo, radiation and surgery) as evidenced by severe fat depletion, severe muscle depletion, percent weight loss. 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 Returned hours after discharge with c/o chest pain. PMH: DM, HTN, dyslipedemia, CKD, IgG kappa MGUS.  Previous Admission 3/10 EGD, XI Robotic Assisted Ivor Lewis Esophagectomy, J-tube placement 3/11 Trickle TF initiated 3/12  Surgery increased TF to 40 ml/hr 3/13 Surgery increased TF to 55 ml/hr, transferred to ICU  3/14 Surgery increased TF to 65 ml/hr (goal), bowel regimen added and +BM 3/15 Intubated, Bronch (Thick mucus in trachea suctioned, partially occluding L mainstem, ?aspiration, recurrent false vocal cord irritation with swelling leading to stridor and resp failure) 3/17 Extubated 3/18 Esophogram:  Brisk tracheal aspiration of ingested contrast, into the LEFT mainstem bronchus. SLP eval recommended. Stagnant contrast at diaphragmatic hiatus. No anastomotic leak.  3/19 Diet advanced to Dysphagia 2/Thins, later downgraded to Nectar Thick Liquids 3/20 Transition to Nocturnal TF, family to bring protein shake in from home 3/21 Significant diarrhea overnight, (9 stools in 10 hours), significant J-tube drainage, change to Vivonex with day time feedings 3/24 Diet changed to Dysphagia 2, Thins per SLP 3/26 NPO due to concern for possible esophageal leak 3/27 advanced to Dysphagia 3, thin liquids  3/31 modify TF regimen for d/c 4/1 discharged home Current Admission 4/2 admitted; OR: R VATS, EGD, and esophageal stent placement 4/3 trial Molli Posey TF formula during the day (16 hours), transfusion 4/4 advanced to goal rate; leaking J-tube, TF held  4/5 txr to cardiovascular ICU 4/7 TF re-initiated, transfused  4/9 J-tube replaced by IR 4/10 significant leaking from J-tube; TFs on hold  4/11 PICC placed, TPN initiated, J-tube replaced again, WOUND VAC 4/12 re-initiate trickles s/p second J-tube replacement 4/13 PICC line replaced 4/14 slowly advance TF goal rate  Pt J-tube with markedly less output this AM. This appears to be the case since second J-tube replacement on Friday (4/11). Trickles restarted and patient running at 2ml/hr this morning and reports tolerance. Spoke with pharmacy and critical care NP via secure chat, who  is amicable to slowly increasing enteral nutrition and assess tolerance. Currently  TF running at 19ml/hr, which is meeting roughly 67% of estimated calorie needs and 62% of estimated protein needs. If tolerating, will meet goal rate to meet almost all of his estimated needs by end of day tomorrow. TPN currently meeting 82% and 90% of calorie and protein needs, respectively, running at a rate of 101ml/hr.   Average Meal Intake No recent documentation to review  Spoke with patient, wife, and RN at bedside this morning. Wife visibly frustrated as patient's lunch tray is at bedside untouched. Continues to state that all food trays have offensive odor. Asked what foods he thinks would not. He cannot endorse this. Asked if cold foods like ice cream or fruit had offensive odor. He could not answer. Will add assist with ordering to meal trays so patient can order what he thinks he could tolerate. Hopeful that intake will increase when able to advance diet texture and foods become more palatable/appealing in appearance and odor. Otherwise, enteral nutrition will meet most of his needs at goal rate.   He reports less feelings of fullness and/or bloating this morning. Acceptance of ProSource Plus has improved and is taking one or both each day. He is consistently refusing Ensure Enlive, as he did last time when ordered. Will discontinue d/t refusals. Continue to strongly encourage PO intake to cut down on artificial nutrition support. Pt appears frustrated at this recommendation. Continue to encourage protein powder and Premiere Protein shakes provided from home if he feels he cannot tolerate anything in-house.   Admit Weight: 77.1kg Current Weight: 74kg Lowest Weight: 70.6kg on 4/12  No edema noted. Wt gain desirable. Hopefully with consistent nutrition intake, slight upward trend in weight will continue. Bowels appear stable with one BM documented yesterday and today.   Intake/Output Summary (Last 24 hours) at 09/01/2023 1516 Last data filed at 09/01/2023 1400 Gross per 24 hour  Intake  3610.97 ml  Output 2185 ml  Net 1425.97 ml    Net IO Since Admission: 1,452.76 mL [09/01/23 1516]   Drains/Lines: PICC (placed 4/11 and replaced 4/13) LUQ: Jejunostomy (16 Fr) R Lateral Pleural (19Fr): x24 hours Wound van to R chest (placed 4/11): x24 hours UOP: 1.5L x24 hours  Phosphorus supplementation via TPN increased today along with insulin coverage. Magnesium supplementation also required. Acetate maxed out and bicarbonate tablets discontinued as well.   Meds: SSI 0-15 TID, pantoprazole, sodium bicarbonate, pantoprazole  Drips: IV ABX  Labs: Na+ 133>134>136(wdl) K+ 3.9 (wdl) Hgb 6.9>8.6>9.5>9.3 (L) Crt 1.87>1.75>1.62 (H) CBGs 110-145 x24 hours A1c 7.1 (07/2023)  Diet Order:   Diet Order             DIET - DYS 1 Room service appropriate? Yes; Fluid consistency: Thin  Diet effective now             EDUCATION NEEDS:   Education needs have been addressed  Skin:  Incisions: R chest tube Other: MASD around J-tube  Last BM:  4/14 - type 7 x1  Height:  Ht Readings from Last 1 Encounters:  08/20/23 5\' 10"  (1.778 m)   Weight:  Wt Readings from Last 1 Encounters:  09/01/23 74 kg   Ideal Body Weight:     BMI:  Body mass index is 23.41 kg/m.  Estimated Nutritional Needs:   Kcal:  2200-2400kcal  Protein:  115-130g  Fluid:  >=2L/day  Con Decant MS, RD, LDN Registered Dietitian Clinical Nutrition RD Inpatient Contact Info in  Amion

## 2023-09-01 NOTE — Progress Notes (Signed)
      [address]       [place] [address]             [phone]      Resting comfortably  BP 138/69   Pulse 100   Temp 99 F (37.2 C) (Oral)   Resp 17   Ht 5\' 10"  (1.778 m)   Wt 74 kg   SpO2 92%   BMI 23.41 kg/m  RA 90%   Intake/Output Summary (Last 24 hours) at 09/01/2023 1923 Last data filed at 09/01/2023 1700 Gross per 24 hour  Intake 3228.11 ml  Output 2105 ml  Net 1123.11 ml   CT 220 ml, VAC 75 ml  Tolerating TF so far, remains on TNA On Vanco and Unasyn for empyema  Landon Pinion C. Luna Salinas, MD [facility] [phone]

## 2023-09-02 DIAGNOSIS — R6510 Systemic inflammatory response syndrome (SIRS) of non-infectious origin without acute organ dysfunction: Principal | ICD-10-CM

## 2023-09-02 DIAGNOSIS — J869 Pyothorax without fistula: Secondary | ICD-10-CM | POA: Diagnosis not present

## 2023-09-02 DIAGNOSIS — K9189 Other postprocedural complications and disorders of digestive system: Secondary | ICD-10-CM | POA: Diagnosis not present

## 2023-09-02 DIAGNOSIS — E46 Unspecified protein-calorie malnutrition: Secondary | ICD-10-CM | POA: Diagnosis not present

## 2023-09-02 DIAGNOSIS — N179 Acute kidney failure, unspecified: Secondary | ICD-10-CM | POA: Diagnosis not present

## 2023-09-02 LAB — CBC
HCT: 21.9 % — ABNORMAL LOW (ref 39.0–52.0)
HCT: 22.3 % — ABNORMAL LOW (ref 39.0–52.0)
Hemoglobin: 7.2 g/dL — ABNORMAL LOW (ref 13.0–17.0)
Hemoglobin: 7.1 g/dL — ABNORMAL LOW (ref 13.0–17.0)
MCH: 31.3 pg (ref 26.0–34.0)
MCH: 29.5 pg (ref 26.0–34.0)
MCHC: 32.9 g/dL (ref 30.0–36.0)
MCHC: 31.8 g/dL (ref 30.0–36.0)
MCV: 95.2 fL (ref 80.0–100.0)
MCV: 92.5 fL (ref 80.0–100.0)
Platelets: 333 10*3/uL (ref 150–400)
Platelets: 310 10*3/uL (ref 150–400)
RBC: 2.3 MIL/uL — ABNORMAL LOW (ref 4.22–5.81)
RBC: 2.41 MIL/uL — ABNORMAL LOW (ref 4.22–5.81)
RDW: 16 % — ABNORMAL HIGH (ref 11.5–15.5)
RDW: 15.5 % (ref 11.5–15.5)
WBC: 6.6 10*3/uL (ref 4.0–10.5)
WBC: 6.8 10*3/uL (ref 4.0–10.5)
nRBC: 0 % (ref 0.0–0.2)
nRBC: 0 % (ref 0.0–0.2)

## 2023-09-02 LAB — BASIC METABOLIC PANEL WITH GFR
Anion gap: 8 (ref 5–15)
BUN: 29 mg/dL — ABNORMAL HIGH (ref 8–23)
CO2: 26 mmol/L (ref 22–32)
Calcium: 6.9 mg/dL — ABNORMAL LOW (ref 8.9–10.3)
Chloride: 102 mmol/L (ref 98–111)
Creatinine, Ser: 1.42 mg/dL — ABNORMAL HIGH (ref 0.61–1.24)
GFR, Estimated: 54 mL/min — ABNORMAL LOW (ref >60)
Glucose, Bld: 265 mg/dL — ABNORMAL HIGH (ref 70–99)
Potassium: 3.7 mmol/L (ref 3.5–5.1)
Sodium: 136 mmol/L (ref 135–145)

## 2023-09-02 LAB — GLUCOSE, CAPILLARY
Glucose-Capillary: 187 mg/dL — ABNORMAL HIGH (ref 70–99)
Glucose-Capillary: 240 mg/dL — ABNORMAL HIGH (ref 70–99)
Glucose-Capillary: 242 mg/dL — ABNORMAL HIGH (ref 70–99)
Glucose-Capillary: 169 mg/dL — ABNORMAL HIGH (ref 70–99)

## 2023-09-02 LAB — BODY FLUID CULTURE W GRAM STAIN

## 2023-09-02 LAB — PHOSPHORUS: Phosphorus: 2 mg/dL — ABNORMAL LOW (ref 2.5–4.6)

## 2023-09-02 MED ORDER — SODIUM CHLORIDE 0.9 % IV SOLN
30.0000 mmol | Freq: Once | INTRAVENOUS | Status: AC
  Administered 2023-09-02: 30 mmol via INTRAVENOUS
  Filled 2023-09-02: qty 10

## 2023-09-02 MED ORDER — INSULIN GLARGINE-YFGN 100 UNIT/ML ~~LOC~~ SOLN
14.0000 [IU] | Freq: Every day | SUBCUTANEOUS | Status: DC
  Administered 2023-09-02 – 2023-09-15 (×14): 14 [IU] via SUBCUTANEOUS
  Filled 2023-09-02 (×15): qty 0.14

## 2023-09-02 MED ORDER — ADULT MULTIVITAMIN W/MINERALS CH
1.0000 | ORAL_TABLET | Freq: Every day | ORAL | Status: DC
  Administered 2023-09-04 – 2023-09-06 (×2): 1
  Filled 2023-09-02 (×4): qty 1

## 2023-09-02 MED ORDER — ORAL CARE MOUTH RINSE
15.0000 mL | OROMUCOSAL | Status: DC
  Administered 2023-09-02 – 2023-09-15 (×30): 15 mL via OROMUCOSAL

## 2023-09-02 MED ORDER — POTASSIUM CHLORIDE 20 MEQ PO PACK
40.0000 meq | PACK | Freq: Once | ORAL | Status: AC
  Administered 2023-09-02: 40 meq
  Filled 2023-09-02: qty 2

## 2023-09-02 MED ORDER — ORAL CARE MOUTH RINSE: 15.0000 mL | OROMUCOSAL | Status: DC | PRN

## 2023-09-02 MED ORDER — AMLODIPINE BESYLATE 10 MG PO TABS
10.0000 mg | ORAL_TABLET | Freq: Every day | ORAL | Status: DC
  Administered 2023-09-02 – 2023-09-15 (×14): 10 mg via ORAL
  Filled 2023-09-02 (×14): qty 1

## 2023-09-02 NOTE — Progress Notes (Signed)
 PHARMACY - TOTAL PARENTERAL NUTRITION CONSULT NOTE   Indication:  inability to tolerate enteral nutrition  Patient Measurements: Height: 5\' 10"  (177.8 cm) Weight: 73.8 kg (162 lb 11.2 oz) IBW/kg (Calculated) : 73 TPN AdjBW (KG): 80.6 Body mass index is 23.34 kg/m. Usual Weight: 77.1 kg at admission; patient reports 200 lbs in January  Assessment: 67yoM presenting for chest pain after admission 3/10 - 4/1 for adenocarcinoma s/p esophagectomy and percutaneous J-tube placement. On previous admission, was advanced to goal TF on POD4 (3/14) then thick liquids/dysphagia 2 3/19. Discharged home on 4/1 with dysphagia 3/thin liquid diet. 4/4 Presented back to [facility] for R VATS, EGD, and esophageal stent placement. Was advanced to goal TF on 4/4 that was subsequently held due to J-tube leaking, replaced 4/9 by IR. Tube feeds attempted 4/5, 4/7, 4/10 AM however concern of limited absorption with persistent leaking. J-tube continues to leak 4/11 AM, note of saturated dressing surrounding J-tube site when discussing with patient at bedside.   4/10 KUB showed nonobstructive gas, no sign of ileus. However, patient complaint of significant abdominal distension with tube feeding, however is not experiencing pain. Per RN, bowel movements were possibly unabsorbed tube feeds. Family has brought in protein shakes however pt states does not want to eat significant amount of food due to concern for frequent leaking from J-tube. Primary team concerned at this time for lack of wound healing with lack of nutrition as well. Now s/p successful J-tube exchange 4/11 afternoon, however team anticipates challenges with advancing enteral nutrition to goal. Pharmacy consulted for TPN initiation 4/11.   Glucose / Insulin: hx DM, A1c 7.1 - CBGs elevated Used 32 units SSI, 9 units TF coverage, Semglee 8 units Electrolytes: Phos 2, others WNL Renal: SCr up 1.42, BUN 29 (stable, appears to be roughly at baseline) Hepatic: Alk Phos 228,  Albumin < 1.5, AST/ALT WNL, Tbili 0.5 Intake / Output; MIVF: 0.9 mL/kg/h UOP (1625 mL), LBM 4/13 x2 (type 7, small-medium); Wound vac output 175 mL. Chest tube output 225 mL.  Net +0.5L GI Imaging: 4/10 KUB: nonobstructive gas, no free air, no stool burden GI Surgeries / Procedures:  4/9 VIR J-tube exchange 4/11 repeat J-tube exchange  Central access: PICC 4/11 TPN start date: 4/11  Nutritional Goals: Goal TPN rate is 95 mL/hr (provides 125g AA and 2200 kcals per day)  RD Estimated Needs Total Energy Estimated Needs: 2200-2400kcal Total Protein Estimated Needs: 115-130g Total Fluid Estimated Needs: >=2L/day  Current Nutrition:  Dysphagia 1 w/ thin liquids - very poor intake per RN [employer] 1.4 TF at 50 ml/hr (goal 60 ml/hr) Premier Protein shakes (each 30 g protein; 160 calories) - brought by family (amount not charted - but <half of each if takes) Ensure Enlive TID (each 20g protein; 350 kcal) - none charted given Prosource Plus 30 BID (each 15g protein; 100 kcal) - 2 charted given  Plan:  TF to be advanced to goal rate this PM Reduce TPN to 40 ml/hr, then stop at 1800 per discussion with CCM/CVTS Thiamine per RD Adult multivitamin PT daily starting 4/16 KPhos IV x 1 Semglee increased to 14 units daily per CCM, continue resistant SSI Q4H and 3 units TF coverage as TPN is being weaned off.  Further adjustment per MD. D/C TPN labs and nursing care orders  Oluwateniola Leitch D. Laney Potash, PharmD, BCPS, BCCCP 09/02/2023, 2:02 PM

## 2023-09-02 NOTE — Progress Notes (Signed)
 Occupational Therapy Treatment Patient Details Name: Seth Burch MRN: [id number] DOB: [birth year] Today's Date: 09/02/2023   History of present illness Pt is a 68 y/o male presenting 4/1 due to acute onset of R chest pain after eating. Pt noted with AKI. 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 4/2 R VATS, EGD, esophageal stent placement, and chest tube placement. 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 PMHx: HTN, DM   OT comments  Patient agreeable for OOB and mobility to the bathroom.  Able to complete stand grooming with CGA, and transition to toilet with RW and CGA.  Patient with decreasing activity tolerance and increased SOB noted.  OT will continue efforts in the acute setting to address deficits, and HH OT will continue to be recommended, but SNF or AIR may be needed if he continues to decline functionally.  Patient really needs to be more aggressive with mobility.        If plan is discharge home, recommend the following:  A little help with walking and/or transfers;A little help with bathing/dressing/bathroom;Assistance with cooking/housework;Assist for transportation;Help with stairs or ramp for entrance   Equipment Recommendations  None recommended by OT    Recommendations for Other Services      Precautions / Restrictions Precautions Precautions: Fall;Other (comment) Precaution/Restrictions Comments: jejunostomy, R chest tube Restrictions Weight Bearing Restrictions Per Provider Order: No       Mobility Bed Mobility Overal bed mobility: Needs Assistance Bed Mobility: Supine to Sit, Sit to Supine     Supine to sit: Min assist, HOB elevated Sit to supine: Contact guard assist        Transfers Overall transfer level: Needs assistance Equipment  used: Rolling walker (2 wheels) Transfers: Bed to chair/wheelchair/BSC, Sit to/from Stand Sit to Stand: Contact guard assist     Step pivot transfers: Contact guard assist           Balance Overall balance assessment: Needs assistance Sitting-balance support: No upper extremity supported, Feet supported Sitting balance-Leahy Scale: Fair     Standing balance support: Reliant on assistive device for balance Standing balance-Leahy Scale: Fair                             ADL either performed or assessed with clinical judgement   ADL       Grooming: Standing;Contact guard assist                   Toilet Transfer: Regular Toilet;Rolling walker (2 wheels);Ambulation;Contact guard assist   Toileting- Clothing Manipulation and Hygiene: Supervision/safety;Sitting/lateral lean              Extremity/Trunk Assessment Upper Extremity Assessment Upper Extremity Assessment: Generalized weakness   Lower Extremity Assessment Lower Extremity Assessment: Defer to PT evaluation        Vision Patient Visual Report: No change from baseline     Perception Perception Perception: Not tested   Praxis Praxis Praxis: Not tested   Communication Communication Communication: No apparent difficulties   Cognition Arousal: Alert Behavior During Therapy: Flat affect Cognition: No apparent impairments                               Following commands: Intact        Cueing  Cueing Techniques: Verbal cues  Exercises      Shoulder Instructions       General Comments      Pertinent Vitals/ Pain       Pain Assessment Pain Assessment: No/denies pain Pain Intervention(s): Monitored during session                                                          Frequency  Min 2X/week        Progress Toward Goals  OT Goals(current goals can now be found in the care plan section)     Acute Rehab OT Goals OT Goal  Formulation: With patient Time For Goal Achievement: 09/16/23 Potential to Achieve Goals: Fair  Plan      Co-evaluation                 AM-PAC OT "6 Clicks" Daily Activity     Outcome Measure   Help from another person eating meals?: None Help from another person taking care of personal grooming?: A Little Help from another person toileting, which includes using toliet, bedpan, or urinal?: A Little Help from another person bathing (including washing, rinsing, drying)?: A Little Help from another person to put on and taking off regular upper body clothing?: A Little Help from another person to put on and taking off regular lower body clothing?: A Little 6 Click Score: 19    End of Session Equipment Utilized During Treatment: Rolling walker (2 wheels)  OT Visit Diagnosis: Unsteadiness on feet (R26.81);Other abnormalities of gait and mobility (R26.89)   Activity Tolerance Patient tolerated treatment well   Patient Left in bed;with call bell/phone within reach   Nurse Communication Mobility status        Time: 1351-1415 OT Time Calculation (min): 24 min  Charges: OT General Charges $OT Visit: 1 Visit OT Treatments $Self Care/Home Management : 23-37 mins  09/02/2023  RP, OTR/L  Acute Rehabilitation Services  Office:  [phone]   Seth Burch 09/02/2023, 3:05 PM

## 2023-09-02 NOTE — Progress Notes (Addendum)
 Nutrition Brief Note  TF rate still titrating up. Running at 67ml/hr this morning. Will increase to 74ml/hr around 1500 today. TPN to be weaned. Pt tolerating tube feed regimen. Bloating/pressure in lower abdomen has resolved and leakage from J-tube significantly reduced. Bowels stable. Lunch tray at bedside on visit today. RN in room with patient and were going to attempt intake of some vanilla Greek yogurt and chilled peaches.   Discussed diet advancement with surgery PA-C in an effort to encourage appetite through palatable mouth feel and appearance. Very hesitant to advance due to recent stent placement and catastrophic outcomes should he vomit. Will continue to monitor for diet advancement.  Estimated Nutritional Needs:  Kcal:  2200-2400kcal Protein:  115-130g Fluid:  >=2L/day   Pleural fluid sent out yesterday to run trigs. Too thick to be run, so being resent today. Questioning chylothorax. Low fat diet may be indicated. Will follow along. Glarginine increased today as blood sugars intermittently in the 200's.     Wt Readings from Last 15 Encounters:  09/02/23 73.8 kg  08/19/23 77.4 kg  07/24/23 82.1 kg  07/18/23 83 kg  07/11/23 83 kg  07/03/23 80.2 kg  06/30/23 79.4 kg  06/12/23 82.6 kg  05/30/23 80.6 kg  05/22/23 81 kg  05/16/23 82.1 kg  05/13/23 80.9 kg  05/09/23 82.6 kg  04/25/23 82.1 kg  04/25/23 81.9 kg    INTERVENTION:  Continue supplemental TPN until TF can be tolerated at goal rate [employer] Protein shakes as tolerated, brought in by family. Each shake has 30g of protein but only 160 calories.  Monitor diet advancement and tolerance as well as the need for low fat Continue to encourage addition of patient purchased unflavored protein powder to low protein food items  30 ml ProSource Plus BID, each supplement provides 100 kcals and 15 grams protein.    NUTRITION DIAGNOSIS:  Severe Malnutrition related to chronic illness (esophageal cancer s/p chemo,  radiation and surgery) as evidenced by severe fat depletion, severe muscle depletion, percent weight loss.- remains applicable   GOAL:  Patient will meet greater than or equal to 90% of their needs - progressing   MONITOR:  PO intake, Diet advancement, TF tolerance  Con Decant MS, RD, LDN Registered Dietitian Clinical Nutrition RD Inpatient Contact Info in Amion

## 2023-09-02 NOTE — Progress Notes (Signed)
 NAME:  Seth Burch, MRN:  [id number], DOB:  [birth year], LOS: 14 ADMISSION DATE:  08/19/2023, CONSULTATION DATE:  4/7 REFERRING MD:  lightfoot, CHIEF COMPLAINT:  post-op care    History of Present Illness:  68 year old male patient who is status post esophagectomy on 07/28/2023 Discharged after 22 days with complicated course due to aspiration pneumonia, respiratory failure, AKI and delirium.  Follow-up swallowing studies were negative for leaks completed antibiotics on 3/27 had been discharged on 4/1 was eating dinner that evening and developed sudden onset of right sided chest discomfort.  He reported to the emergency room at this time showed new tachycardia, chest x-ray with increased opacity at right base white blood cell count 15.7 up from 12,000 and a lactate of 2.8 he was started on IV fluids, administered IV antibiotics, and thoracic surgery was consulted.  Diagnostic imaging was consistent with esophageal leak at the anastomosis site with associated pleural effusion he therefore went to the operating room for video-assisted thoracoscopy with decortication, as well as esophageal stent placement on 4/2.  Postoperative course fairly unremarkable, recovering in the intensive care, receiving tube feeds via J-tube, and dysphagia 1 diet was started.  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  He is status post radiation and chemotherapy January 2025 follow-up PET scan showed substantial improvement in hypermetabolic activity.  Just discharged following robotic assisted esophagectomy and placement of jejunostomy tube 3/10  we could check a left now I do not want to nSignificant Hospital Events: Including procedures, antibiotic start and stop dates in addition  to other pertinent events   Discharged post esophagectomy, readmitted later that day. Initially started on vancomycin and cefepime,  4/2, VATS with decortication and esophageal stent placement  4/3 changed to fluconazole and levofloxacin on  4/4 through 4/6: Had some J-tube leakage.Tolerating dysphagia 1 diet 4/7 pulmonary/critical care asked to evaluate and assist with ICU care culturesFrom just growing moderate lactobacillus species and few Rothia MUCILAGINOSA, change antibiotics to Unasyn 4/14 increasing EN. Learned that his pleural fluid was too thick for outside lab to run trigs   4/15 awaiting pleural fluid send outs still   Interim History / Subjective:   Pleural fluid was too thick for Trigs.   NAEO  Not eating this morning again. Doesn't want to try.   Objective   Blood pressure (!) 160/82, pulse (!) 115, temperature 98.7 F (37.1 C), temperature source Oral, resp. rate (!) 34, height 5\' 10"  (1.778 m), weight 73.8 kg, SpO2 90%.        Intake/Output Summary (Last 24 hours) at 09/02/2023 0901 Last data filed at 09/02/2023 0545 Gross per 24 hour  Intake 3218.38 ml  Output 2020 ml  Net 1198.38 ml   Filed Weights   08/31/23 0500 09/01/23 0400 09/02/23 0545  Weight: 71.4 kg 74 kg 73.8 kg    General: chronically ill M NAD  HEENT NCAT  Neuro: AAOx4  CV:  rrr cap refill < 3 sec  PULM: R chest tube with mix of white creamy output and semi-translucent  fluid  GI: soft. L J tube  Extremities: noa cute joint deformity  Skin: would vac w white output    Micro: 4/2 wound cx> lactobacillus, rothia Fungus culture pending 4/1 BC 1/2> lactobacillius 1/4  4/13 pleural cx --  GPC, GPR -- multiple organisms none predominant    Resolved Hospital Problem list     Assessment & Plan:   Distal esophageal adenocarcinoma s/p esophagectomy (3/10)  Esophageal leak + R empyema s/p VATS decort & esophageal stent (4/2)  4/13 pleural cx -- GPC, GPR -- multiple organisms none  predominant  P -follow pleural cx  - f/u send outs-- notably fluid was too viscous for outside lab to check trigs.  - 5d vanc (was on for wound) and cont unasyn   Inadequate PO intake Malnutrition  J tube leak  -Dys 1 diet + EN via J tube + TPN  -advanced  EN 4/14  -needs to eat, doesn't like the food   AKI on CKD2 Hypokalemia Hypophosphatemia  -follow BMP, lytes. Replace PRN    Elevated alk phos P -follow PRN   AoC anemia  P -PRN CBC   DM2 w hyperglycemia P -rSSI -incr semglee to 14 -EN coverage 3units novolog q4   HTN HLD -BID metop & resume home amlodipine  -statin   BPH -flomax  Dispo:  -duration in ICU per primary. Expect he is nearing readiness to transfer out; patient did not like his stay outside of the ICU.    Best Practice (right click and "Reselect all SmartList Selections" daily)   Diet/type: dysphagia diet (see orders), EN, TPN  DVT prophylaxis LMWH Pressure ulcer(s): N/A GI prophylaxis: PPI Lines: Central line, Port-A-Cath, picc Foley:  N/A Code Status:  full code Last date of multidisciplinary goals of care discussion [per primary ]  Critical care time: NA    Moderate MDM   Eston Hence MSN, AGACNP-BC [facility] Amion for pager  09/02/2023, 9:01 AM

## 2023-09-02 NOTE — Progress Notes (Signed)
 TCTS DAILY ICU PROGRESS NOTE                   [address]            [place] [address]          [phone]   13 Days Post-Op Procedure(s) (LRB): VIDEO ASSISTED THORACOSCOPY (VATS)/DECORTICATION (Right) EGD (ESOPHAGOGASTRODUODENOSCOPY) (N/A)  Total Length of Stay:  LOS: 14 days   Subjective: Feels ok  Objective: Vital signs in last 24 hours: Temp:  [99 F (37.2 C)-100.5 F (38.1 C)] 100.5 F (38.1 C) (04/15 0300) Pulse Rate:  [87-115] 115 (04/15 0700) Cardiac Rhythm: Sinus tachycardia (04/15 0345) Resp:  [14-35] 34 (04/15 0700) BP: (132-160)/(66-99) 160/82 (04/15 0700) SpO2:  [89 %-93 %] 90 % (04/15 0700) Weight:  [73.8 kg] 73.8 kg (04/15 0545)  Filed Weights   08/31/23 0500 09/01/23 0400 09/02/23 0545  Weight: 71.4 kg 74 kg 73.8 kg    Weight change: -0.2 kg   Hemodynamic parameters for last 24 hours:    Intake/Output from previous day: 04/14 0701 - 04/15 0700 In: 3528.5 [P.O.:240; I.V.:1634.8; NG/GT:873.5; IV Piggyback:650.2] Out: 2155 [Urine:1700; Drains:75; Chest Tube:380]  Intake/Output this shift: No intake/output data recorded.  Current Meds: Scheduled Meds:  (feeding supplement) PROSource Plus  30 mL Oral BID BM   alteplase  2 mg Intracatheter Once   atorvastatin  20 mg Oral Daily   Chlorhexidine Gluconate Cloth  6 each Topical Daily   enoxaparin (LOVENOX) injection  40 mg Subcutaneous Q24H   feeding supplement  237 mL Oral TID BM   Gerhardt's butt cream  1 Application Topical TID   insulin aspart  0-20 Units Subcutaneous Q4H   insulin aspart  3 Units Subcutaneous Q4H   insulin glargine-yfgn  10 Units Subcutaneous Daily   liver oil-zinc oxide   Topical TID   metoprolol tartrate  25 mg Oral BID   mouth rinse  15 mL Mouth Rinse 4 times per day   pantoprazole (PROTONIX) IV  40 mg Intravenous Q24H   sodium chloride flush  10-40 mL Intracatheter Q12H   tamsulosin  0.4 mg Oral Daily   thiamine  100 mg Per Tube Daily   Continuous  Infusions:  ampicillin-sulbactam (UNASYN) IV Stopped (09/02/23 0323)   feeding supplement (KATE FARMS STANDARD ENT 1.4) 50 mL/hr at 09/02/23 0532   TPN ADULT (ION) 75 mL/hr at 09/02/23 0532   vancomycin Stopped (09/01/23 1102)   PRN Meds:.acetaminophen (TYLENOL) oral liquid 160 mg/5 mL, hydrALAZINE, HYDROmorphone (DILAUDID) injection, ondansetron (ZOFRAN) IV, mouth rinse, oxyCODONE, polyvinyl alcohol, sodium chloride flush  General appearance: alert, cooperative, fatigued, and no distress Heart: regular rate and rhythm Lungs: dim in bases Abdomen: benign Extremities: no edema or calf tenderness Wound: incis - healing well, VAC in place  Lab Results: CBC:No results for input(s): "WBC", "HGB", "HCT", "PLT" in the last 72 hours. BMET:  Recent Labs    08/31/23 0358 09/01/23 0436  NA 135 135  K 3.2* 3.8  CL 102 103  CO2 24 24  GLUCOSE 190* 231*  BUN 22 24*  CREATININE 1.35* 1.38*  CALCIUM 7.3* 7.1*    CMET: Lab Results  Component Value Date   WBC 7.7 08/29/2023   HGB 8.3 (L) 08/29/2023   HCT 25.9 (L) 08/29/2023   PLT 336 08/29/2023   GLUCOSE 231 (H) 09/01/2023   TRIG 73 09/01/2023   ALT 20 09/01/2023   AST 29 09/01/2023   NA 135 09/01/2023   K 3.8  09/01/2023   CL 103 09/01/2023   CREATININE 1.38 (H) 09/01/2023   BUN 24 (H) 09/01/2023   CO2 24 09/01/2023   INR 1.2 08/20/2023   HGBA1C 7.1 (H) 07/24/2023   Recent Results (from the past 240 hours)  Pleural fluid culture w Gram Stain     Status: Abnormal (Preliminary result)   Collection Time: 08/31/23  9:29 AM   Specimen: Pleural Fluid  Result Value Ref Range Status   Specimen Description PLEURAL  Final   Special Requests NONE  Final   Gram Stain   Final    ABUNDANT WBC PRESENT, PREDOMINANTLY PMN FEW GRAM POSITIVE COCCI RARE GRAM POSITIVE RODS Performed at [facility], [address], [place], [place] [address]    Culture MULTIPLE ORGANISMS PRESENT, NONE PREDOMINANT (A)  Final   Report Status PENDING   Incomplete      PT/INR: No results for input(s): "LABPROT", "INR" in the last 72 hours. Radiology: No results found.   Assessment/Plan: S/P Procedure(s) (LRB): VIDEO ASSISTED THORACOSCOPY (VATS)/DECORTICATION (Right) EGD (ESOPHAGOGASTRODUODENOSCOPY) (N/A)  1 tmax 100.5, s BP 130's-160's,mostly elevated, may need additional agent,  SR/ST 2 sats good on RA 3 good UOP 4 CT - 380 ml/24 h 5 BS some readings in 200's- will increase glargine a little 6 creat stable at 1.38 7 alk phos slowly trending higher, AST/ALT?T bili- normal 8 alb/protein remain low- conts TNA/TF's, eating a little bit 9 conts unasyn/vanco 10 CT drainage- milky, TG's 73- doesn't exclude chylothorax 11 lovenox for DVT ppx 12 appreciate CCM management    Lindi Revering PA-C Pager [phone] 09/02/2023 7:22 AM

## 2023-09-03 ENCOUNTER — Inpatient Hospital Stay (HOSPITAL_COMMUNITY)

## 2023-09-03 DIAGNOSIS — N179 Acute kidney failure, unspecified: Secondary | ICD-10-CM | POA: Diagnosis not present

## 2023-09-03 DIAGNOSIS — J869 Pyothorax without fistula: Secondary | ICD-10-CM | POA: Diagnosis not present

## 2023-09-03 DIAGNOSIS — K9189 Other postprocedural complications and disorders of digestive system: Secondary | ICD-10-CM | POA: Diagnosis not present

## 2023-09-03 DIAGNOSIS — E46 Unspecified protein-calorie malnutrition: Secondary | ICD-10-CM | POA: Diagnosis not present

## 2023-09-03 LAB — CBC
HCT: 21.4 % — ABNORMAL LOW (ref 39.0–52.0)
Hemoglobin: 6.8 g/dL — CL (ref 13.0–17.0)
MCH: 29.3 pg (ref 26.0–34.0)
MCHC: 31.8 g/dL (ref 30.0–36.0)
MCV: 92.2 fL (ref 80.0–100.0)
Platelets: 298 10*3/uL (ref 150–400)
RBC: 2.32 MIL/uL — ABNORMAL LOW (ref 4.22–5.81)
RDW: 15.6 % — ABNORMAL HIGH (ref 11.5–15.5)
WBC: 6.1 10*3/uL (ref 4.0–10.5)
nRBC: 0 % (ref 0.0–0.2)

## 2023-09-03 LAB — GLUCOSE, CAPILLARY
Glucose-Capillary: 141 mg/dL — ABNORMAL HIGH (ref 70–99)
Glucose-Capillary: 141 mg/dL — ABNORMAL HIGH (ref 70–99)
Glucose-Capillary: 143 mg/dL — ABNORMAL HIGH (ref 70–99)
Glucose-Capillary: 122 mg/dL — ABNORMAL HIGH (ref 70–99)
Glucose-Capillary: 98 mg/dL (ref 70–99)
Glucose-Capillary: 96 mg/dL (ref 70–99)

## 2023-09-03 LAB — BASIC METABOLIC PANEL WITH GFR
Anion gap: 6 (ref 5–15)
BUN: 26 mg/dL — ABNORMAL HIGH (ref 8–23)
CO2: 25 mmol/L (ref 22–32)
Calcium: 6.6 mg/dL — ABNORMAL LOW (ref 8.9–10.3)
Chloride: 106 mmol/L (ref 98–111)
Creatinine, Ser: 1.44 mg/dL — ABNORMAL HIGH (ref 0.61–1.24)
GFR, Estimated: 54 mL/min — ABNORMAL LOW (ref >60)
Glucose, Bld: 133 mg/dL — ABNORMAL HIGH (ref 70–99)
Potassium: 4 mmol/L (ref 3.5–5.1)
Sodium: 137 mmol/L (ref 135–145)

## 2023-09-03 LAB — HEMOGLOBIN AND HEMATOCRIT, BLOOD
HCT: 25.4 % — ABNORMAL LOW (ref 39.0–52.0)
Hemoglobin: 8.2 g/dL — ABNORMAL LOW (ref 13.0–17.0)

## 2023-09-03 LAB — PREPARE RBC (CROSSMATCH)

## 2023-09-03 LAB — PHOSPHORUS: Phosphorus: 2.3 mg/dL — ABNORMAL LOW (ref 2.5–4.6)

## 2023-09-03 MED ORDER — SODIUM PHOSPHATES 45 MMOLE/15ML IV SOLN
15.0000 mmol | Freq: Once | INTRAVENOUS | Status: AC
  Administered 2023-09-03: 15 mmol via INTRAVENOUS
  Filled 2023-09-03: qty 5

## 2023-09-03 MED ORDER — SODIUM CHLORIDE 0.9% IV SOLUTION: Freq: Once | INTRAVENOUS | Status: AC

## 2023-09-03 MED ORDER — PANTOPRAZOLE SODIUM 40 MG PO TBEC
40.0000 mg | DELAYED_RELEASE_TABLET | Freq: Every day | ORAL | Status: DC
  Administered 2023-09-03 – 2023-09-15 (×13): 40 mg via ORAL
  Filled 2023-09-03 (×13): qty 1

## 2023-09-03 NOTE — Progress Notes (Signed)
 Physical Therapy Treatment Patient Details Name: Seth Burch MRN: [id number] DOB: 1956-02-07 Today's Date: 09/03/2023   History of Present Illness Pt is a 68 y/o male presenting 4/1 due to acute onset of R chest pain after eating. Pt noted with AKI. 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 4/2 R VATS, EGD, esophageal stent placement, and chest tube placement. 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 PMHx: HTN, DM    PT Comments  Pt in bed upon arrival with wife present and agreeable to PT session. Worked on increasing gait distance in today's session. Pt was able to ambulate ~400 ft with supervision for safety with lines and use of RW. Pt preferred to have UE support when ambulating and did not want to attempt ambulating with no AD. Pt continues to need MinA for bed mobility. He requested to sit on the EOB at the end of session due to feeling like the recliner was uncomfortable even with pillows for comfort. Pt is progressing well towards goals. Acute PT to follow.   BP 109/66, 100-105 BPM 97% SpO2 on RA     If plan is discharge home, recommend the following: Assistance with cooking/housework;Assist for transportation;Help with stairs or ramp for entrance;A little help with walking and/or transfers;A little help with bathing/dressing/bathroom   Can travel by private vehicle      Yes  Equipment Recommendations  None recommended by PT       Precautions / Restrictions Precautions Precautions: Fall;Other (comment) Precaution/Restrictions Comments: jejunostomy, R chest tube, wound vac Restrictions Weight Bearing Restrictions Per Provider Order: No     Mobility  Bed Mobility Overal bed mobility: Needs Assistance Bed Mobility: Rolling, Sidelying to Sit Rolling: Min  assist Sidelying to sit: Min assist, HOB elevated    General bed mobility comments: MinA for slight assist to roll and raise trunk    Transfers Overall transfer level: Needs assistance Equipment used: Rolling walker (2 wheels) Transfers: Sit to/from Stand Sit to Stand: Supervision, +2 safety/equipment    General transfer comment: supervision for safety/lines    Ambulation/Gait Ambulation/Gait assistance: Supervision, +2 safety/equipment Gait Distance (Feet): 400 Feet Assistive device: Rolling walker (2 wheels) Gait Pattern/deviations: WFL(Within Functional Limits) Gait velocity: decr     General Gait Details: steady with RW, preferred to have UE support in today's session.    Balance Overall balance assessment: Needs assistance Sitting-balance support: No upper extremity supported, Feet supported Sitting balance-Leahy Scale: Fair     Standing balance support: Reliant on assistive device for balance Standing balance-Leahy Scale: Fair Standing balance comment: able to stand statically with no AD, RW for gait     Communication Communication Communication: No apparent difficulties  Cognition Arousal: Alert Behavior During Therapy: Flat affect   PT - Cognitive impairments: No apparent impairments    Following commands: Intact      Cueing Cueing Techniques: Verbal cues         Pertinent Vitals/Pain Pain Assessment Pain Assessment: No/denies pain     PT Goals (current goals can now be found in the care plan section) Acute Rehab PT Goals PT Goal Formulation: With patient Time For Goal Achievement: 09/17/23 Potential to Achieve Goals: Good Progress towards PT goals: Progressing toward goals    Frequency    Min 2X/week       AM-PAC PT "6 Clicks" Mobility   Outcome Measure  Help needed turning from your back to your side while in a flat bed without using bedrails?: A Little Help needed moving from lying on your back to sitting on the side of a flat bed  without using bedrails?: A Little Help needed moving to and from a bed to a chair (including a wheelchair)?: A Little Help needed standing up from a chair using your arms (e.g., wheelchair or bedside chair)?: A Little Help needed to walk in hospital room?: A Little Help needed climbing 3-5 steps with a railing? : A Little 6 Click Score: 18    End of Session   Activity Tolerance: Patient tolerated treatment well Patient left: in bed;with call bell/phone within reach;with bed alarm set;with family/visitor present;with nursing/sitter in room Nurse Communication: Mobility status PT Visit Diagnosis: Other abnormalities of gait and mobility (R26.89);Unsteadiness on feet (R26.81)     Time: 0981-1914 PT Time Calculation (min) (ACUTE ONLY): 24 min  Charges:    $Gait Training: 8-22 mins $Therapeutic Activity: 8-22 mins PT General Charges $$ ACUTE PT VISIT: 1 Visit                    Orysia Blas, PT, DPT Secure Chat Preferred  Rehab Office [phone]   Alissa April Adela Ades 09/03/2023, 11:44 AM

## 2023-09-03 NOTE — Progress Notes (Signed)
 Nutrition Brief Note  Tube feeding now running at goal rate of 99ml/hr and patient tolerating. No events overnight. TPN has been discontinued. Bloating/pressure in lower abdomen has resolved and leakage from J-tube significantly reduced. Bowels stable. He is taking some PO, per CCM note today. Still not a significant amount.   Pleural labs still pending to assess for chylothorax. Fluid was too viscous to check trigs. Receiving 1 unit PRBC today. Plan to remove PICC should he continue to tolerate enteral nutrition. Remains in ICU.   Estimated Nutritional Needs:  Kcal:  2200-2400kcal Protein:  115-130g Fluid:  >=2L/day   Wt Readings from Last 15 Encounters:  09/03/23 81.4 kg  08/19/23 77.4 kg  07/24/23 82.1 kg  07/18/23 83 kg  07/11/23 83 kg  07/03/23 80.2 kg  06/30/23 79.4 kg  06/12/23 82.6 kg  05/30/23 80.6 kg  05/22/23 81 kg  05/16/23 82.1 kg  05/13/23 80.9 kg  05/09/23 82.6 kg  04/25/23 82.1 kg  04/25/23 81.9 kg    INTERVENTION:  Continue tube feeding via J-tube: [employer] 1.5 Peptide at 60 ml/h (1440 ml per day)  Provides 2160 kcal, 107 gm protein, 1008 ml free water daily   Continue Premier Protein shakes as tolerated, brought in by family. Each shake has 30g of protein but only 160 calories.  Monitor diet advancement and tolerance as well as the need for low fat Continue to encourage addition of patient purchased unflavored protein powder to low protein food items  Continue 30 ml ProSource Plus BID, each supplement provides 100 kcals and 15 grams protein.    NUTRITION DIAGNOSIS:  Severe Malnutrition related to chronic illness (esophageal cancer s/p chemo, radiation and surgery) as evidenced by severe fat depletion, severe muscle depletion, percent weight loss.- remains applicable   GOAL:  Patient will meet greater than or equal to 90% of their needs - progressing   MONITOR:  PO intake, Diet advancement, TF tolerance   Con Decant MS, RD, LDN Registered  Dietitian Clinical Nutrition RD Inpatient Contact Info in Amion

## 2023-09-03 NOTE — Progress Notes (Signed)
 NAME:  Seth Burch, MRN:  [id number], DOB:  1956/04/13, LOS: 15 ADMISSION DATE:  08/19/2023, CONSULTATION DATE:  4/7 REFERRING MD:  lightfoot, CHIEF COMPLAINT:  post-op care    History of Present Illness:  68 year old male patient who is status post esophagectomy on 07/28/2023 Discharged after 22 days with complicated course due to aspiration pneumonia, respiratory failure, AKI and delirium.  Follow-up swallowing studies were negative for leaks completed antibiotics on 3/27 had been discharged on 4/1 was eating dinner that evening and developed sudden onset of right sided chest discomfort.  He reported to the emergency room at this time showed new tachycardia, chest x-ray with increased opacity at right base white blood cell count 15.7 up from 12,000 and a lactate of 2.8 he was started on IV fluids, administered IV antibiotics, and thoracic surgery was consulted.  Diagnostic imaging was consistent with esophageal leak at the anastomosis site with associated pleural effusion he therefore went to the operating room for video-assisted thoracoscopy with decortication, as well as esophageal stent placement on 4/2.  Postoperative course fairly unremarkable, recovering in the intensive care, receiving tube feeds via J-tube, and dysphagia 1 diet was started.  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  He is status post radiation and chemotherapy January 2025 follow-up PET scan showed substantial improvement in hypermetabolic activity.  Just discharged following robotic assisted esophagectomy and placement of jejunostomy tube 3/10  we could check a left now I do not want to nSignificant Hospital Events: Including procedures, antibiotic start and stop dates in addition  to other pertinent events   Discharged post esophagectomy, readmitted later that day. Initially started on vancomycin and cefepime,  4/2, VATS with decortication and esophageal stent placement  4/3 changed to fluconazole and levofloxacin on  4/4 through 4/6: Had some J-tube leakage.Tolerating dysphagia 1 diet 4/7 pulmonary/critical care asked to evaluate and assist with ICU care culturesFrom just growing moderate lactobacillus species and few Rothia MUCILAGINOSA, change antibiotics to Unasyn 4/14 increasing EN. Learned that his pleural fluid was too thick for outside lab to run trigs   4/15 awaiting pleural fluid send outs still  4/16 off TPN  Interim History / Subjective:   NAEO still not eating much  Completed vanc  Temp overnight a little elevated   Objective   Blood pressure 112/66, pulse 87, temperature 98.2 F (36.8 C), resp. rate (!) 23, height 5\' 10"  (1.778 m), weight 81.4 kg, SpO2 93%.        Intake/Output Summary (Last 24 hours) at 09/03/2023 1033 Last data filed at 09/03/2023 0700 Gross per 24 hour  Intake 2845.59 ml  Output 2170 ml  Net 675.59 ml   Filed Weights   09/01/23 0400 09/02/23 0545 09/03/23 0400  Weight: 74 kg 73.8 kg 81.4 kg    General: chronically ill M HEENT NCAT  Neuro: AAOx4 generalized weakness  CV:  rr cap refill < 3 sec  PULM: R chest tube w milky output  GI: L sided J tube  Extremities: no acute joint deformity  Skin: clean. R chest wound vac w white output    Micro: 4/2 wound cx> lactobacillus, rothia Fungus culture pending 4/1 [facility] 1/2> lactobacillius 1/4 4/13 pleural cx -- GPC, GPR -- multiple organisms none predominant. No  staph aureus or  GAS isolated    Resolved Hospital Problem list     Assessment & Plan:   Distal esophageal adenocarcinoma s/p esophagectomy (3/10)  Esophageal leak + R empyema s/p VATS decort & esophageal stent (4/2)  4/13 pleural cx -- GPC, GPR -- multiple organisms none predominant  P -follow pleural cx   -unasyn  -- completed 5d vanc  - send out pleural labs still pending-- notably fluid was too viscous for outside lab to check trigs.   Inadequate PO intake Malnutrition  J tube leak  -Dys 1 diet + EN via J tube  -needs to eat, doesn't like the food.  -fortunately EN now meeting enough of his goals that is is off TPN 4/16  -cont micronutrient support  AKI on CKD2 Hypophosphatemia  -follow BMP, lytes. Replace PRN    Elevated alk phos P -follow PRN   AoC anemia  P -1 PRBC 4/16   DM2 w hyperglycemia P -rSSI -semglee 14 -EN coverage 3units novolog q4   HTN HLD -BID metop & amlodipine --continue  -statin   BPH -flomax  Elevated temp -is on unasyn, just watching this for now  -if he cont tolerating EN, would rec dc PICC   Dispo:  -duration in ICU per primary -- has no true ICU needs   Best Practice (right click and "Reselect all SmartList Selections" daily)   Diet/type: dysphagia diet (see orders), EN  DVT prophylaxis LMWH Pressure ulcer(s): N/A GI prophylaxis: PPI Lines:  Port-A-Cath, picc Foley:  N/A Code Status:  full code Last date of multidisciplinary goals of care discussion [per primary ]  Critical care time: NA    CCT na   Eston Hence MSN, AGACNP-BC [facility] Pulmonary/Critical Care Medicine Amion for pager  09/03/2023, 10:43 AM

## 2023-09-03 NOTE — Progress Notes (Addendum)
 TCTS DAILY ICU PROGRESS NOTE                   [address]            [place] [address]          [phone]   14 Days Post-Op Procedure(s) (LRB): VIDEO ASSISTED THORACOSCOPY (VATS)/DECORTICATION (Right) EGD (ESOPHAGOGASTRODUODENOSCOPY) (N/A)  Total Length of Stay:  LOS: 15 days   Subjective: Feeling ok, remains pretty weak  Objective: Vital signs in last 24 hours: Temp:  [98.9 F (37.2 C)-101.9 F (38.8 C)] 101.9 F (38.8 C) (04/16 0400) Pulse Rate:  [91-107] 91 (04/16 0600) Cardiac Rhythm: Sinus tachycardia (04/15 2000) Resp:  [14-38] 19 (04/16 0600) BP: (99-152)/(59-80) 99/59 (04/16 0600) SpO2:  [88 %-93 %] 91 % (04/16 0600) Weight:  [81.4 kg] 81.4 kg (04/16 0400)  Filed Weights   09/01/23 0400 09/02/23 0545 09/03/23 0400  Weight: 74 kg 73.8 kg 81.4 kg    Weight change: 7.6 kg   Hemodynamic parameters for last 24 hours:    Intake/Output from previous day: 04/15 0701 - 04/16 0700 In: 3560.7 [P.O.:360; I.V.:655.8; NG/GT:1280; IV Piggyback:1184.9] Out: 2295 [Urine:1675; Drains:50; Stool:400; Chest Tube:170]  Intake/Output this shift: No intake/output data recorded.  Current Meds: Scheduled Meds:  (feeding supplement) PROSource Plus  30 mL Oral BID BM   alteplase  2 mg Intracatheter Once   amLODipine  10 mg Oral Daily   atorvastatin  20 mg Oral Daily   Chlorhexidine Gluconate Cloth  6 each Topical Daily   enoxaparin (LOVENOX) injection  40 mg Subcutaneous Q24H   Gerhardt's butt cream  1 Application Topical TID   insulin aspart  0-20 Units Subcutaneous Q4H   insulin aspart  3 Units Subcutaneous Q4H   insulin glargine-yfgn  14 Units Subcutaneous Daily   liver oil-zinc oxide   Topical TID   metoprolol tartrate  25 mg Oral BID   multivitamin with minerals  1 tablet Per Tube Daily   mouth rinse  15 mL Mouth Rinse 4 times per day   pantoprazole (PROTONIX) IV  40 mg Intravenous Q24H   sodium chloride flush  10-40 mL Intracatheter Q12H    tamsulosin  0.4 mg Oral Daily   thiamine  100 mg Per Tube Daily   Continuous Infusions:  ampicillin-sulbactam (UNASYN) IV Stopped (09/03/23 0439)   feeding supplement (KATE FARMS STANDARD ENT 1.4) 60 mL/hr at 09/03/23 0600   PRN Meds:.acetaminophen (TYLENOL) oral liquid 160 mg/5 mL, hydrALAZINE, HYDROmorphone (DILAUDID) injection, ondansetron (ZOFRAN) IV, mouth rinse, oxyCODONE, polyvinyl alcohol, sodium chloride flush  General appearance: alert, cooperative, and no distress Heart: regular rate and rhythm Lungs: dim in bases Abdomen: soft, non tender, non dist Extremities: no edema or calf tenderness Wound: VAC in place, J tube erethema looks stable, incis healing well  Lab Results: CBC: Recent Labs    09/02/23 1220 09/03/23 0455  WBC 6.8 6.1  HGB 7.1* 6.8*  HCT 22.3* 21.4*  PLT 310 298   BMET:  Recent Labs    09/02/23 1219 09/03/23 0455  NA 136 137  K 3.7 4.0  CL 102 106  CO2 26 25  GLUCOSE 265* 133*  BUN 29* 26*  CREATININE 1.42* 1.44*  CALCIUM 6.9* 6.6*    CMET: Lab Results  Component Value Date   WBC 6.1 09/03/2023   HGB 6.8 (LL) 09/03/2023   HCT 21.4 (L) 09/03/2023   PLT 298 09/03/2023   GLUCOSE 133 (H) 09/03/2023   TRIG 73 09/01/2023  ALT 20 09/01/2023   AST 29 09/01/2023   NA 137 09/03/2023   K 4.0 09/03/2023   CL 106 09/03/2023   CREATININE 1.44 (H) 09/03/2023   BUN 26 (H) 09/03/2023   CO2 25 09/03/2023   INR 1.2 08/20/2023   HGBA1C 7.1 (H) 07/24/2023   Recent Results (from the past 240 hours)  Pleural fluid culture w Gram Stain     Status: Abnormal   Collection Time: 08/31/23  9:29 AM   Specimen: Pleural Fluid  Result Value Ref Range Status   Specimen Description PLEURAL  Final   Special Requests NONE  Final   Gram Stain   Final    ABUNDANT WBC PRESENT, PREDOMINANTLY PMN FEW GRAM POSITIVE COCCI RARE GRAM POSITIVE RODS    Culture (A)  Final    MULTIPLE ORGANISMS PRESENT, NONE PREDOMINANT NO STAPHYLOCOCCUS AUREUS ISOLATED NO GROUP A  STREP (S.PYOGENES) ISOLATED Performed at [facility], [address], [place], [place] [address]    Report Status 09/02/2023 FINAL  Final       PT/INR: No results for input(s): "LABPROT", "INR" in the last 72 hours. Radiology: No results found.   Assessment/Plan: S/P Procedure(s) (LRB): VIDEO ASSISTED THORACOSCOPY (VATS)/DECORTICATION (Right) EGD (ESOPHAGOGASTRODUODENOSCOPY) (N/A)  1  Tmax 101.9, s BP 90's-150's, most recent BP control is improved w/ addition of norvasc, SR/ST 2 O2 sats ok on RA 3 CT 170 ml/24h, drain 50 ml/24h- remains milky 4 good UOP 5 TF's - 60 ml/hr- TPN discontinued 6 BS some improvement w/ most recent changes in insulin 7 no leukocytosis- has had some fever overnight- cont unasyn 8 will repeat CXR 9 H/H - Hgb now 6.8 -I think he would benefit from a unit of blood 10 Wounds appear stable 11 lovenox for DVT ppx     Lindi Revering PA-C Pager [phone] 09/03/2023 7:11 AM  Patient seen and examined, agree with findings and plan outlined above Pleural drainage concerning for chyle, monitor as TF increased Would keep PICC until we are sure this is not a chyle leak Temp to 101.9- on Unasyn- monitor, reculture if spikes  Landon Pinion C. Luna Salinas, MD [facility] [phone]

## 2023-09-03 NOTE — Progress Notes (Signed)
      [address]       [place] [address]             [phone]      POD # 14  100.6 BP 138/76   Pulse (!) 106   Temp (!) 100.6 F (38.1 C) (Oral)   Resp (!) 32   Ht 5\' 10"  (1.778 m)   Wt 81.4 kg   SpO2 91%   BMI 25.75 kg/m  RA 91%   Intake/Output Summary (Last 24 hours) at 09/03/2023 1756 Last data filed at 09/03/2023 1701 Gross per 24 hour  Intake 2702.98 ml  Output 2505 ml  Net 197.98 ml   CT 70 on 1st shift  Ladine Kiper C. Luna Salinas, MD [facility] [phone]

## 2023-09-03 NOTE — Plan of Care (Signed)
  Problem: Clinical Measurements: Goal: Diagnostic test results will improve Outcome: Progressing Goal: Respiratory complications will improve Outcome: Progressing   Problem: Fluid Volume: Goal: Ability to maintain a balanced intake and output will improve Outcome: Progressing   Problem: Education: Goal: Ability to describe self-care measures that may prevent or decrease complications (Diabetes Survival Skills Education) will improve Outcome: Progressing

## 2023-09-04 LAB — GLUCOSE, CAPILLARY
Glucose-Capillary: 120 mg/dL — ABNORMAL HIGH (ref 70–99)
Glucose-Capillary: 121 mg/dL — ABNORMAL HIGH (ref 70–99)
Glucose-Capillary: 140 mg/dL — ABNORMAL HIGH (ref 70–99)
Glucose-Capillary: 159 mg/dL — ABNORMAL HIGH (ref 70–99)
Glucose-Capillary: 53 mg/dL — ABNORMAL LOW (ref 70–99)
Glucose-Capillary: 62 mg/dL — ABNORMAL LOW (ref 70–99)
Glucose-Capillary: 76 mg/dL (ref 70–99)
Glucose-Capillary: 80 mg/dL (ref 70–99)

## 2023-09-04 LAB — BASIC METABOLIC PANEL WITH GFR
Anion gap: 7 (ref 5–15)
BUN: 25 mg/dL — ABNORMAL HIGH (ref 8–23)
CO2: 26 mmol/L (ref 22–32)
Calcium: 7.2 mg/dL — ABNORMAL LOW (ref 8.9–10.3)
Chloride: 105 mmol/L (ref 98–111)
Creatinine, Ser: 1.44 mg/dL — ABNORMAL HIGH (ref 0.61–1.24)
GFR, Estimated: 54 mL/min — ABNORMAL LOW (ref >60)
Glucose, Bld: 98 mg/dL (ref 70–99)
Potassium: 4.3 mmol/L (ref 3.5–5.1)
Sodium: 138 mmol/L (ref 135–145)

## 2023-09-04 LAB — TYPE AND SCREEN
ABO/RH(D): B NEG
Antibody Screen: NEGATIVE
Unit division: 0

## 2023-09-04 LAB — CBC
HCT: 24.9 % — ABNORMAL LOW (ref 39.0–52.0)
Hemoglobin: 8.1 g/dL — ABNORMAL LOW (ref 13.0–17.0)
MCH: 29.2 pg (ref 26.0–34.0)
MCHC: 32.5 g/dL (ref 30.0–36.0)
MCV: 89.9 fL (ref 80.0–100.0)
Platelets: 305 10*3/uL (ref 150–400)
RBC: 2.77 MIL/uL — ABNORMAL LOW (ref 4.22–5.81)
RDW: 16.1 % — ABNORMAL HIGH (ref 11.5–15.5)
WBC: 6.7 10*3/uL (ref 4.0–10.5)
nRBC: 0 % (ref 0.0–0.2)

## 2023-09-04 LAB — BPAM RBC
Blood Product Expiration Date: 202505232359
ISSUE DATE / TIME: 202504161004
Unit Type and Rh: 1700

## 2023-09-04 LAB — PHOSPHORUS: Phosphorus: 3 mg/dL (ref 2.5–4.6)

## 2023-09-04 MED ORDER — BOOST / RESOURCE BREEZE PO LIQD CUSTOM
1.0000 | Freq: Every day | ORAL | Status: DC
  Administered 2023-09-05 – 2023-09-08 (×2): 1 via ORAL

## 2023-09-04 MED ORDER — OCTREOTIDE ACETATE 100 MCG/ML IJ SOLN
100.0000 ug | Freq: Three times a day (TID) | INTRAMUSCULAR | Status: DC
  Administered 2023-09-04 – 2023-09-15 (×32): 100 ug via SUBCUTANEOUS
  Filled 2023-09-04 (×36): qty 1

## 2023-09-04 MED ORDER — PROSOURCE TF20 ENFIT COMPATIBL EN LIQD
60.0000 mL | Freq: Two times a day (BID) | ENTERAL | Status: DC
  Administered 2023-09-04 – 2023-09-15 (×20): 60 mL
  Filled 2023-09-04 (×21): qty 60

## 2023-09-04 MED ORDER — NON FORMULARY: 1000.0000 mL | Status: DC

## 2023-09-04 MED ORDER — VIVONEX RTF PO LIQD
1000.0000 mL | ORAL | Status: DC
  Administered 2023-09-04 – 2023-09-06 (×3): 1000 mL via ORAL
  Filled 2023-09-04 (×4): qty 1000

## 2023-09-04 NOTE — Progress Notes (Signed)
 Occupational Therapy Treatment Patient Details Name: Seth Burch MRN: [id number] DOB: Mar 22, 1956 Today's Date: 09/04/2023   History of present illness Pt is a 68 y/o male presenting 4/1 due to acute onset of R chest pain after eating. Pt noted with AKI. 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 4/2 R VATS, EGD, esophageal stent placement, and chest tube placement. 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 PMHx: HTN, DM   OT comments  Patient beginning to feel better, beginning to participate consistently, and making solid gains toward patient focused goals.  Patient is essentially at a supervision level, limited primarily by line/leeds and wound vac.  OT will continue efforts in the acute setting and no post acute OT is anticipated.        If plan is discharge home, recommend the following:  Assistance with cooking/housework;Assist for transportation;Help with stairs or ramp for entrance   Equipment Recommendations  None recommended by OT    Recommendations for Other Services      Precautions / Restrictions Precautions Precautions: Fall;Other (comment) Precaution/Restrictions Comments: jejunostomy, R chest tube, wound vac Restrictions Weight Bearing Restrictions Per Provider Order: No       Mobility Bed Mobility Overal bed mobility: Needs Assistance Bed Mobility: Supine to Sit, Sit to Supine   Sidelying to sit: Min assist, HOB elevated   Sit to supine: Contact guard assist        Transfers Overall transfer level: Needs assistance Equipment used: Rolling walker (2 wheels) Transfers: Sit to/from Stand Sit to Stand: Supervision                 Balance Overall balance assessment: Needs assistance Sitting-balance support: No upper extremity supported,  Feet supported Sitting balance-Leahy Scale: Good     Standing balance support: Reliant on assistive device for balance Standing balance-Leahy Scale: Fair                             ADL either performed or assessed with clinical judgement   ADL       Grooming: Standing;Supervision/safety           Upper Body Dressing : Set up;Sitting   Lower Body Dressing: Supervision/safety;Sit to/from stand   Toilet Transfer: Supervision/safety;Rolling walker (2 wheels);Regular Toilet;Ambulation                  Extremity/Trunk Assessment Upper Extremity Assessment Upper Extremity Assessment: Overall WFL for tasks assessed RUE Sensation: WNL RUE Coordination: WNL LUE Sensation: WNL LUE Coordination: WNL   Lower Extremity Assessment Lower Extremity Assessment: Defer to PT evaluation   Cervical / Trunk Assessment Cervical / Trunk Assessment: Normal    Vision Patient Visual Report: No change from baseline     Perception Perception Perception: Not tested   Praxis Praxis Praxis: Not tested   Communication Communication Communication: No apparent difficulties   Cognition Arousal: Alert Behavior During Therapy: WFL for tasks assessed/performed Cognition: No apparent impairments                               Following commands: Intact        Cueing   Cueing Techniques: Verbal cues  Exercises      Shoulder Instructions       General Comments  Pertinent Vitals/ Pain       Pain Assessment Pain Assessment: No/denies pain Pain Intervention(s): Monitored during session                                                          Frequency  Min 2X/week        Progress Toward Goals  OT Goals(current goals can now be found in the care plan section)  Progress towards OT goals: Progressing toward goals  Acute Rehab OT Goals OT Goal Formulation: With patient Time For Goal Achievement:  09/16/23 Potential to Achieve Goals: Good  Plan      Co-evaluation                 AM-PAC OT "6 Clicks" Daily Activity     Outcome Measure   Help from another person eating meals?: None Help from another person taking care of personal grooming?: None Help from another person toileting, which includes using toliet, bedpan, or urinal?: A Little Help from another person bathing (including washing, rinsing, drying)?: A Little Help from another person to put on and taking off regular upper body clothing?: None Help from another person to put on and taking off regular lower body clothing?: A Little 6 Click Score: 21    End of Session Equipment Utilized During Treatment: Rolling walker (2 wheels)  OT Visit Diagnosis: Unsteadiness on feet (R26.81);Other abnormalities of gait and mobility (R26.89)   Activity Tolerance Patient tolerated treatment well   Patient Left in bed;with call bell/phone within reach   Nurse Communication Mobility status        Time: 1610-9604 OT Time Calculation (min): 22 min  Charges: OT General Charges $OT Visit: 1 Visit OT Treatments $Therapeutic Activity: 8-22 mins  09/04/2023  RP, OTR/L  Acute Rehabilitation Services  Office:  [phone]   Seth Burch 09/04/2023, 9:55 AM

## 2023-09-04 NOTE — Plan of Care (Signed)
  Problem: Metabolic: Goal: Ability to maintain appropriate glucose levels will improve Outcome: Progressing   Problem: Tissue Perfusion: Goal: Adequacy of tissue perfusion will improve Outcome: Progressing   Problem: Education: Goal: Knowledge of General Education information will improve Description: Including pain rating scale, medication(s)/side effects and non-pharmacologic comfort measures Outcome: Progressing   Problem: Elimination: Goal: Will not experience complications related to urinary retention Outcome: Progressing

## 2023-09-04 NOTE — Progress Notes (Addendum)
 [facility] DAILY ICU PROGRESS NOTE                   [address]            [place] [address]          [phone]   15 Days Post-Op Procedure(s) (LRB): VIDEO ASSISTED THORACOSCOPY (VATS)/DECORTICATION (Right) EGD (ESOPHAGOGASTRODUODENOSCOPY) (N/A)  Total Length of Stay:  LOS: 16 days   Subjective: Feels about the same, slowly improving w/ rehab  Objective: Vital signs in last 24 hours: Temp:  [98 F (36.7 C)-100.6 F (38.1 C)] 100.2 F (37.9 C) (04/16 2320) Pulse Rate:  [85-111] 94 (04/17 0600) Cardiac Rhythm: Normal sinus rhythm (04/17 0430) Resp:  [11-34] 28 (04/17 0600) BP: (97-147)/(62-82) 126/72 (04/17 0600) SpO2:  [89 %-94 %] 89 % (04/17 0600) Weight:  [78.7 kg] 78.7 kg (04/17 0500)  Filed Weights   09/02/23 0545 09/03/23 0400 09/04/23 0500  Weight: 73.8 kg 81.4 kg 78.7 kg    Weight change: -2.7 kg   Hemodynamic parameters for last 24 hours:    Intake/Output from previous day: 04/16 0701 - 04/17 0700 In: 2252.8 [I.V.:27.5; Blood:385; NG/GT:1440; IV Piggyback:400.3] Out: 1910 [Urine:1775; Drains:25; Chest Tube:110]  Intake/Output this shift: No intake/output data recorded.  Current Meds: Scheduled Meds:  (feeding supplement) PROSource Plus  30 mL Oral BID BM   alteplase  2 mg Intracatheter Once   amLODipine  10 mg Oral Daily   atorvastatin  20 mg Oral Daily   Chlorhexidine Gluconate Cloth  6 each Topical Daily   enoxaparin (LOVENOX) injection  40 mg Subcutaneous Q24H   Gerhardt's butt cream  1 Application Topical TID   insulin aspart  0-20 Units Subcutaneous Q4H   insulin aspart  3 Units Subcutaneous Q4H   insulin glargine-yfgn  14 Units Subcutaneous Daily   liver oil-zinc oxide   Topical TID   metoprolol tartrate  25 mg Oral BID   multivitamin with minerals  1 tablet Per Tube Daily   mouth rinse  15 mL Mouth Rinse 4 times per day   pantoprazole  40 mg Oral Daily   sodium chloride flush  10-40 mL Intracatheter Q12H   tamsulosin  0.4  mg Oral Daily   thiamine  100 mg Per Tube Daily   Continuous Infusions:  ampicillin-sulbactam (UNASYN) IV Stopped (09/04/23 0137)   feeding supplement (KATE FARMS STANDARD ENT 1.4) 60 mL/hr at 09/04/23 0600   PRN Meds:.acetaminophen (TYLENOL) oral liquid 160 mg/5 mL, hydrALAZINE, HYDROmorphone (DILAUDID) injection, ondansetron (ZOFRAN) IV, mouth rinse, oxyCODONE, polyvinyl alcohol, sodium chloride flush  General appearance: alert, cooperative, and no distress Heart: regular rate and rhythm Lungs: mildly dim right base Abdomen: soft, non distended Extremities: no edema or calf tenderness Wound: incis stable healing- VAC in place  Lab Results: CBC: Recent Labs    09/03/23 0455 09/03/23 1605 09/04/23 0332  WBC 6.1  --  6.7  HGB 6.8* 8.2* 8.1*  HCT 21.4* 25.4* 24.9*  PLT 298  --  305   BMET:  Recent Labs    09/03/23 0455 09/04/23 0332  NA 137 138  K 4.0 4.3  CL 106 105  CO2 25 26  GLUCOSE 133* 98  BUN 26* 25*  CREATININE 1.44* 1.44*  CALCIUM 6.6* 7.2*    CMET: Lab Results  Component Value Date   WBC 6.7 09/04/2023   HGB 8.1 (L) 09/04/2023   HCT 24.9 (L) 09/04/2023   PLT 305 09/04/2023   GLUCOSE 98 09/04/2023  TRIG 73 09/01/2023   ALT 20 09/01/2023   AST 29 09/01/2023   NA 138 09/04/2023   K 4.3 09/04/2023   CL 105 09/04/2023   CREATININE 1.44 (H) 09/04/2023   BUN 25 (H) 09/04/2023   CO2 26 09/04/2023   INR 1.2 08/20/2023   HGBA1C 7.1 (H) 07/24/2023      PT/INR: No results for input(s): "LABPROT", "INR" in the last 72 hours. Radiology: DG Chest 1 View Result Date: 09/03/2023 CLINICAL DATA:  Esophagectomy EXAM: CHEST  1 VIEW COMPARISON:  08/31/2023 FINDINGS: Expandable wire stent extends from the thoracic inlet to the diaphragmatic hiatus. No change from prior. Port in the anterior chest wall with tip in distal SVC. Mild RIGHT basilar atelectasis.  No effusion.  No pneumothorax. IMPRESSION: 1. No change from prior.  Stable support apparatus 2. Mild RIGHT  basilar atelectasis. Electronically Signed   By: Deboraha Fallow M.D.   On: 09/03/2023 11:37   Recent Results (from the past 240 hours)  Pleural fluid culture w Gram Stain     Status: Abnormal   Collection Time: 08/31/23  9:29 AM   Specimen: Pleural Fluid  Result Value Ref Range Status   Specimen Description PLEURAL  Final   Special Requests NONE  Final   Gram Stain   Final    ABUNDANT WBC PRESENT, PREDOMINANTLY PMN FEW GRAM POSITIVE COCCI RARE GRAM POSITIVE RODS    Culture (A)  Final    MULTIPLE ORGANISMS PRESENT, NONE PREDOMINANT NO STAPHYLOCOCCUS AUREUS ISOLATED NO GROUP A STREP (S.PYOGENES) ISOLATED Performed at [facility], [address], [place], [place] [address]    Report Status 09/02/2023 FINAL  Final      Assessment/Plan: S/P Procedure(s) (LRB): VIDEO ASSISTED THORACOSCOPY (VATS)/DECORTICATION (Right) EGD (ESOPHAGOGASTRODUODENOSCOPY) (N/A)  1 Tmax 100.6, SR/ST, s BP 90's-140's 2 sats good on RA 3 CXR remains stable in appearance 4 CT drainage decreasing- 110 ml/24 h 5 VAC 25 ml/24h 6 H/H improved after 1 unit PRBC's - cont to monitor 7 no leukocytosis 8 BS control much improved on current insulin plan 9 renal fxn stable 10 on unasyn till 4/20 as currently scheduled 11 lovenox for DVT ppx 12 push rehab as able  Lindi Revering PA-C Pager [phone]  09/04/2023 7:10 AM  Patient seen and examined, agree with findings and plan outlined above Looks better Drainage remains milky but has decreased in volume despite TF Will see if there is an alternative TF with little or no fat  Landon Pinion C. Luna Salinas, MD [facility] [phone]

## 2023-09-04 NOTE — Progress Notes (Signed)
 Pt arrived to unit from 2 heart  VSS, A/O x 4,  CCMD called ,CHG given, pt oriented to unit,Will continue to monitor.   Eldonna Greenspan Gurpreet Mariani, RN    09/04/23 1135  Vitals  Temp 98.7 F (37.1 C)  Temp Source Oral  BP 131/72  MAP (mmHg) 88  BP Location Left Arm  BP Method Automatic  Patient Position (if appropriate) Lying  Pulse Rate 100  Pulse Rate Source Monitor  ECG Heart Rate 100  Resp 17  Level of Consciousness  Level of Consciousness Alert  Oxygen Therapy  SpO2 93 %  O2 Device Room Air  O2 Flow Rate (L/min) 0 L/min  Pain Assessment  Pain Scale 0-10  Pain Score 0  MEWS Score  MEWS Temp 0  MEWS Systolic 0  MEWS Pulse 0  MEWS RR 0  MEWS LOC 0  MEWS Score 0  MEWS Score Color Marrie Sizer

## 2023-09-04 NOTE — Progress Notes (Addendum)
 Nutrition Follow-up  DOCUMENTATION CODES:   Severe malnutrition in context of chronic illness  INTERVENTION:  Transition tube feeding via J-tube: Vivonex RTF at 70 ml/h (1680 ml per day) ProSource TF20 60ml BID   Provides 1840 kcal (84% estimated needs), 124 gm protein (108% estimated needs), 1424 ml free water  daily, and 19g fat    RD to order meal trays to ensure low fat content Add Boost Breeze po once daily, each supplement provides 250 kcal and 9 grams of protein  Discontinue Premier Protein shakes as tolerated, brought in by family. Each shake has 30g of protein but only 160 calories.  Monitor diet advancement and tolerance as well as the need for low fat Discontinue patient purchased unflavored protein powder to low protein food items  Discontinue 30 ml ProSource Plus BID, each supplement provides 100 kcals and 15 grams protein.   NUTRITION DIAGNOSIS:  Severe Malnutrition related to chronic illness (esophageal cancer s/p chemo, radiation and surgery) as evidenced by severe fat depletion, severe muscle depletion, percent weight loss. - remains appropriate  GOAL:  Patient will meet greater than or equal to 90% of their needs - meeting w/ TF running at goal rate  MONITOR:  PO intake, Diet advancement, TF tolerance  REASON FOR ASSESSMENT:  Consult New TPN/TNA  ASSESSMENT:   Pt recently discharged from [facility] after complicated admission (3/10-4/1) d/t adenocarcinoma of distal third esophagus s/p chemo and radiation and admitted for scheduled esophagectomy. Returned hours after discharge with c/o chest pain. PMH: DM, HTN, dyslipedemia, CKD, IgG kappa MGUS.  Previous Admission 3/10 EGD, XI Robotic Assisted Ivor Lewis Esophagectomy, J-tube placement 3/11 Trickle TF initiated 3/12 Surgery increased TF to 40 ml/hr 3/13 Surgery increased TF to 55 ml/hr, transferred to ICU  3/14 Surgery increased TF to 65 ml/hr (goal), bowel regimen added and +BM 3/15 Intubated, Bronch (Thick mucus  in trachea suctioned, partially occluding L mainstem, ?aspiration, recurrent false vocal cord irritation with swelling leading to stridor and resp failure) 3/17 Extubated 3/18 Esophogram:  Brisk tracheal aspiration of ingested contrast, into the LEFT mainstem bronchus. SLP eval recommended. Stagnant contrast at diaphragmatic hiatus. No anastomotic leak.  3/19 Diet advanced to Dysphagia 2/Thins, later downgraded to Nectar Thick Liquids 3/20 Transition to Nocturnal TF, family to bring protein shake in from home 3/21 Significant diarrhea overnight, (9 stools in 10 hours), significant J-tube drainage, change to Vivonex with day time feedings 3/24 Diet changed to Dysphagia 2, Thins per SLP 3/26 NPO due to concern for possible esophageal leak 3/27 advanced to Dysphagia 3, thin liquids  3/31 modify TF regimen for d/c 4/1 discharged home Current Admission 4/2 admitted; OR: R VATS, EGD, and esophageal stent placement 4/3 trial Johny Nap TF formula during the day (16 hours), transfusion 4/4 advanced to goal rate; leaking J-tube, TF held  4/5 txr to cardiovascular ICU 4/7 TF re-initiated, transfused  4/9 J-tube replaced by IR 4/10 significant leaking from J-tube; TFs on hold  4/11 PICC placed, TPN initiated, J-tube replaced again, WOUND VAC 4/12 re-initiate trickles s/p second J-tube replacement 4/13 PICC line replaced 4/14 slowly advance TF goal rate 4/16 TF to goal rate; 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 Testing of pt pleural fluid still pending and  resent today. 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 Low fat nutrition now indicated. Recommend transition to Vivonex for this reason and d/c oral Prosource (pt refusing) and Ensure supplements. Prosource is  low fat and will be transitioned to TF version and administered via tube to aid in meeting calorie and protein needs while keeping fat  content down.   Average Meal Intake 4/10: 10% x1 documented meal 4/14: 0% x3 documented meals 4/15: 0% x2 documented meals  Intake remains suboptimal. New tube feed regimen to meet at least 83% of his estimated needs. With one Boost Breeze per day, will meet 95% of estimated calorie needs. Protein needs being met via enteral nutrition alone. Meals ordered through Monday and average 2.3 g fat per meal. Expected intake is negligible as is his recent baseline. Encouraged intake, however to meet calorie deficit if Boost Breeze to not be accepted.  Fat content of previous tube feed regimen providing 28g fat, with new regimen providing 19g. Given that new regimen offering more than lower limit of <10g/day in conjunction with <1051mL output from chest tube, will hold off on MCT supplementation at this time. Monitor for s/sx of EFAD.  Admit Weight: 77.1kg Current Weight:  78.7kg Lowest Weight: 70.6kg on 4/12  Bowels moving. Some hypoactive bowel sounds documented yesterday. Banatrol can be added, if indicated. Will assess tolerance to TF transition. Some generalized non-pitting edema present on exam.   Intake/Output Summary (Last 24 hours) at 09/04/2023 1327 Last data filed at 09/04/2023 1025 Gross per 24 hour  Intake 1690.38 ml  Output 1640 ml  Net 50.38 ml    Net IO Since Admission: 3,817.82 mL [09/04/23 1327]   Drains/Lines: PICC (placed 4/11 and replaced 4/13) LUQ: Jejunostomy (16 Fr) R Lateral Pleural (19Fr): x24 hours Wound vac to R chest (placed 4/11): 25ml x24 hours UOP: 1.8L x24 hours  Insulin  regimen adjusted. Blood sugars improving. PICC line remains in event TPN is indicated again.  Meds: SSI 0-20 TID, SSI 3 q4 hours, Semglee  14 daily, pantoprazole , thiamine , MVI  Drips: IV ABX  Labs: Na+ 138 (wdl) K+ 4.3 (wdl) PHOS 2.0>2.3>3.0 (wdl) Hgb 6.8>8.2>8.1 (L) CBGs 98-133 x24 hours A1c 7.1 (07/2023)  Diet Order:   Diet Order             DIET - DYS 1 Room service  appropriate? Yes with Assist; Fluid consistency: Thin  Diet effective now             EDUCATION NEEDS:  Education needs have been addressed  Skin:  Incisions: R chest tube Other: MASD around J-tube  Last BM:  4/17 - type 7 x2  Height:  Ht Readings from Last 1 Encounters:  08/20/23 5\' 10"  (1.778 m)   Weight:  Wt Readings from Last 1 Encounters:  09/04/23 78.7 kg    BMI:  Body mass index is 24.89 kg/m.  Estimated Nutritional Needs:   Kcal:  2200-2400kcal  Protein:  115-130g  Fluid:  >=2L/day  Con Decant MS, RD, LDN Registered Dietitian Clinical Nutrition RD Inpatient Contact Info in Amion

## 2023-09-04 NOTE — Progress Notes (Addendum)
 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  Agree with removing fats from TF and diet as well as octreotide and medium chain fatty acid supplementation.  I drew some fluid from [facility] and sent to lab for repeat TG and cholesterol check (previous was "too viscous")  Ardelle Kos MD PCCM

## 2023-09-05 ENCOUNTER — Inpatient Hospital Stay (HOSPITAL_COMMUNITY)

## 2023-09-05 LAB — GLUCOSE, CAPILLARY
Glucose-Capillary: 167 mg/dL — ABNORMAL HIGH (ref 70–99)
Glucose-Capillary: 177 mg/dL — ABNORMAL HIGH (ref 70–99)
Glucose-Capillary: 186 mg/dL — ABNORMAL HIGH (ref 70–99)
Glucose-Capillary: 157 mg/dL — ABNORMAL HIGH (ref 70–99)
Glucose-Capillary: 123 mg/dL — ABNORMAL HIGH (ref 70–99)
Glucose-Capillary: 101 mg/dL — ABNORMAL HIGH (ref 70–99)

## 2023-09-05 LAB — BASIC METABOLIC PANEL WITH GFR
Anion gap: 8 (ref 5–15)
BUN: 25 mg/dL — ABNORMAL HIGH (ref 8–23)
CO2: 26 mmol/L (ref 22–32)
Calcium: 7.5 mg/dL — ABNORMAL LOW (ref 8.9–10.3)
Chloride: 103 mmol/L (ref 98–111)
Creatinine, Ser: 1.43 mg/dL — ABNORMAL HIGH (ref 0.61–1.24)
GFR, Estimated: 54 mL/min — ABNORMAL LOW (ref >60)
Glucose, Bld: 175 mg/dL — ABNORMAL HIGH (ref 70–99)
Potassium: 4.3 mmol/L (ref 3.5–5.1)
Sodium: 137 mmol/L (ref 135–145)

## 2023-09-05 LAB — CBC
HCT: 24.4 % — ABNORMAL LOW (ref 39.0–52.0)
Hemoglobin: 7.9 g/dL — ABNORMAL LOW (ref 13.0–17.0)
MCH: 29.3 pg (ref 26.0–34.0)
MCHC: 32.4 g/dL (ref 30.0–36.0)
MCV: 90.4 fL (ref 80.0–100.0)
Platelets: 327 10*3/uL (ref 150–400)
RBC: 2.7 MIL/uL — ABNORMAL LOW (ref 4.22–5.81)
RDW: 15.9 % — ABNORMAL HIGH (ref 11.5–15.5)
WBC: 5.5 10*3/uL (ref 4.0–10.5)
nRBC: 0 % (ref 0.0–0.2)

## 2023-09-05 MED ORDER — CARMEX CLASSIC LIP BALM EX OINT
TOPICAL_OINTMENT | CUTANEOUS | Status: DC | PRN
  Filled 2023-09-05: qty 10

## 2023-09-05 MED ORDER — MEDIUM CHAIN TRIGLYCERIDES PO OIL
15.0000 mL | TOPICAL_OIL | Freq: Three times a day (TID) | ORAL | Status: DC
  Administered 2023-09-05 – 2023-09-08 (×10): 15 mL
  Filled 2023-09-05 (×14): qty 15

## 2023-09-05 NOTE — Progress Notes (Signed)
 Physical Therapy Treatment Patient Details Name: Seth Burch MRN: [id number] DOB: 1955-10-17 Today's Date: 09/05/2023   History of Present Illness Pt is a 68 y/o male presenting 4/1 due to acute onset of R chest pain after eating. Pt noted with AKI. 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 4/2 R VATS, EGD, esophageal stent placement, and chest tube placement. 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 PMHx: HTN, DM    PT Comments  Pt resting in bed on arrival, pleasant and agreeable to session. Pt continues to be limited in safe mobility by lines/leads and wound vac as well as impaired balance/postural reactions. Pt continues to prefer UE support of RW for gait with pt able to progress overall activity tolerance with grossly supervision for safety. Pt requiring min A to complete bed mobility with assist needed to manage trunk. Educated pt on importnace of frequent mobilization to maximize functional progress. Pt continues to benefit from skilled PT services to progress toward functional mobility goals.      If plan is discharge home, recommend the following: Assistance with cooking/housework;Assist for transportation;Help with stairs or ramp for entrance;A little help with walking and/or transfers;A little help with bathing/dressing/bathroom   Can travel by private vehicle        Equipment Recommendations  None recommended by PT    Recommendations for Other Services       Precautions / Restrictions Precautions Precautions: Fall;Other (comment) Precaution/Restrictions Comments: jejunostomy, R chest tube, wound vac Restrictions Weight Bearing Restrictions Per Provider Order: No     Mobility  Bed Mobility Overal bed mobility: Needs Assistance Bed Mobility: Supine to Sit,  Sit to Supine   Sidelying to sit: Min assist, HOB elevated Supine to sit: HOB elevated, Contact guard     General bed mobility comments: MinA for slight assist to roll and raise trunk    Transfers Overall transfer level: Needs assistance Equipment used: Rolling walker (2 wheels) Transfers: Sit to/from Stand Sit to Stand: Supervision           General transfer comment: supervision for safety/lines    Ambulation/Gait Ambulation/Gait assistance: Supervision Gait Distance (Feet): 500 Feet Assistive device: Rolling walker (2 wheels) Gait Pattern/deviations: WFL(Within Functional Limits)       General Gait Details: steady with RW, preferred to have UE support in today's session especailly with lines/leads   Stairs             Wheelchair Mobility     Tilt Bed    Modified Rankin (Stroke Patients Only)       Balance Overall balance assessment: Needs assistance Sitting-balance support: No upper extremity supported, Feet supported Sitting balance-Leahy Scale: Good Sitting balance - Comments: remained sitting EOB with RN at end of session (to bathe)   Standing balance support: Reliant on assistive device for balance Standing balance-Leahy Scale: Fair Standing balance comment: able to stand statically with no AD, RW for gait                            Communication Communication Communication: No apparent difficulties  Cognition Arousal: Alert Behavior During Therapy: WFL for tasks assessed/performed   PT - Cognitive impairments: No apparent impairments                       PT -  Cognition Comments: mildly anxious but eager to d/c home Following commands: Intact      Cueing Cueing Techniques: Verbal cues  Exercises      General Comments        Pertinent Vitals/Pain Pain Assessment Pain Assessment: No/denies pain Pain Intervention(s): Monitored during session    Home Living                          Prior  Function            PT Goals (current goals can now be found in the care plan section) Acute Rehab PT Goals Patient Stated Goal: to need less assistance with mobility and not need RW PT Goal Formulation: With patient Time For Goal Achievement: 09/17/23 Progress towards PT goals: Progressing toward goals    Frequency    Min 2X/week      PT Plan      Co-evaluation              AM-PAC PT "6 Clicks" Mobility   Outcome Measure  Help needed turning from your back to your side while in a flat bed without using bedrails?: A Little Help needed moving from lying on your back to sitting on the side of a flat bed without using bedrails?: A Little Help needed moving to and from a bed to a chair (including a wheelchair)?: A Little Help needed standing up from a chair using your arms (e.g., wheelchair or bedside chair)?: A Little Help needed to walk in hospital room?: A Little Help needed climbing 3-5 steps with a railing? : A Little 6 Click Score: 18    End of Session   Activity Tolerance: Patient tolerated treatment well Patient left: in bed;with call bell/phone within reach Nurse Communication: Mobility status PT Visit Diagnosis: Other abnormalities of gait and mobility (R26.89);Unsteadiness on feet (R26.81)     Time: 1191-4782 PT Time Calculation (min) (ACUTE ONLY): 19 min  Charges:    $Therapeutic Exercise: 8-22 mins PT General Charges $$ ACUTE PT VISIT: 1 Visit                     Jaxten Brosh R. PTA Acute Rehabilitation Services Office: [phone]   [name] 09/05/2023, 3:13 PM

## 2023-09-05 NOTE — Progress Notes (Addendum)
 16 Days Post-Op Procedure(s) (LRB): VIDEO ASSISTED THORACOSCOPY (VATS)/DECORTICATION (Right) EGD (ESOPHAGOGASTRODUODENOSCOPY) (N/A) Subjective: TF's changed to lower fat concentration   Objective: Vital signs in last 24 hours: Temp:  [98.7 F (37.1 C)-101.4 F (38.6 C)] 99.1 F (37.3 C) (04/18 0200) Pulse Rate:  [87-103] 93 (04/18 0200) Cardiac Rhythm: Normal sinus rhythm (04/17 1925) Resp:  [17-35] 25 (04/18 0500) BP: (119-138)/(65-76) 119/71 (04/18 0200) SpO2:  [91 %-97 %] 93 % (04/18 0200) Weight:  [87.1 kg] 87.1 kg (04/18 0500)  Hemodynamic parameters for last 24 hours:    Intake/Output from previous day: 04/17 0701 - 04/18 0700 In: 1358.1 [P.O.:60; NG/GT:1108; IV Piggyback:100.1] Out: 1720 [Urine:1000; Drains:75; Chest Tube:645] Intake/Output this shift: No intake/output data recorded.  General appearance: alert, cooperative, and no distress Heart: regular rate and rhythm Lungs: clear to auscultation bilaterally Abdomen: benign Extremities: no edema or calf tenderness Wound: j tube maceration is stable- drainage from under VAC appears more purulent  Lab Results: Recent Labs    09/04/23 0332 09/05/23 0255  WBC 6.7 5.5  HGB 8.1* 7.9*  HCT 24.9* 24.4*  PLT 305 327   BMET:  Recent Labs    09/04/23 0332 09/05/23 0255  NA 138 137  K 4.3 4.3  CL 105 103  CO2 26 26  GLUCOSE 98 175*  BUN 25* 25*  CREATININE 1.44* 1.43*  CALCIUM  7.2* 7.5*    PT/INR: No results for input(s): "LABPROT", "INR" in the last 72 hours. ABG    Component Value Date/Time   PHART 7.4 07/29/2023 0514   HCO3 25.8 08/02/2023 2006   TCO2 17 (L) 08/19/2023 2022   ACIDBASEDEF 1.0 08/02/2023 1506   O2SAT 94.5 08/02/2023 2006   CBG (last 3)  Recent Labs    09/04/23 2049 09/04/23 2354 09/05/23 0317  GLUCAP 140* 120* 167*    Meds Scheduled Meds:  amLODipine   10 mg Oral Daily   atorvastatin   20 mg Oral Daily   Chlorhexidine  Gluconate Cloth  6 each Topical Daily   enoxaparin   (LOVENOX ) injection  40 mg Subcutaneous Q24H   feeding supplement  1 Container Oral Daily   feeding supplement (PROSource TF20)  60 mL Per Tube BID   Gerhardt's butt cream  1 Application Topical TID   insulin  aspart  0-20 Units Subcutaneous Q4H   insulin  aspart  3 Units Subcutaneous Q4H   insulin  glargine-yfgn  14 Units Subcutaneous Daily   liver oil-zinc  oxide   Topical TID   metoprolol  tartrate  25 mg Oral BID   multivitamin with minerals  1 tablet Per Tube Daily   octreotide   100 mcg Subcutaneous TID   mouth rinse  15 mL Mouth Rinse 4 times per day   pantoprazole   40 mg Oral Daily   sodium chloride  flush  10-40 mL Intracatheter Q12H   tamsulosin   0.4 mg Oral Daily   thiamine   100 mg Per Tube Daily   Continuous Infusions:  ampicillin -sulbactam (UNASYN ) IV 3 g (09/05/23 0253)   Vivonex RTF 1,000 mL (09/04/23 1736)   PRN Meds:.acetaminophen  (TYLENOL ) oral liquid 160 mg/5 mL, HYDROmorphone  (DILAUDID ) injection, ondansetron  (ZOFRAN ) IV, mouth rinse, oxyCODONE , polyvinyl alcohol , sodium chloride  flush  Xrays DG Chest 1 View Result Date: 09/03/2023 CLINICAL DATA:  Esophagectomy EXAM: CHEST  1 VIEW COMPARISON:  08/31/2023 FINDINGS: Expandable wire stent extends from the thoracic inlet to the diaphragmatic hiatus. No change from prior. Port in the anterior chest wall with tip in distal SVC. Mild RIGHT basilar atelectasis.  No effusion.  No pneumothorax. IMPRESSION:  1. No change from prior.  Stable support apparatus 2. Mild RIGHT basilar atelectasis. Electronically Signed   By: Deboraha Fallow M.D.   On: 09/03/2023 11:37   Recent Results (from the past 240 hours)  Pleural fluid culture w Gram Stain     Status: Abnormal   Collection Time: 08/31/23  9:29 AM   Specimen: Pleural Fluid  Result Value Ref Range Status   Specimen Description PLEURAL  Final   Special Requests NONE  Final   Gram Stain   Final    ABUNDANT WBC PRESENT, PREDOMINANTLY PMN FEW GRAM POSITIVE COCCI RARE GRAM POSITIVE  RODS    Culture (A)  Final    MULTIPLE ORGANISMS PRESENT, NONE PREDOMINANT NO STAPHYLOCOCCUS AUREUS ISOLATED NO GROUP A STREP (S.PYOGENES) ISOLATED Performed at [facility], [address], [place], [place] [address]    Report Status 09/02/2023 FINAL  Final    Assessment/Plan: S/P Procedure(s) (LRB): VIDEO ASSISTED THORACOSCOPY (VATS)/DECORTICATION (Right) EGD (ESOPHAGOGASTRODUODENOSCOPY) (N/A)  1 Tmax 101.4, VSS 2 O2 sats ok on RA 3 good UOP 4 CT drainage with fairly significant increase in drainage to 645 ml/24 h- milky, treating as chylothorax 5 drain 75 ml/24h- prevena 6 creat remains stable 7 H/H stable 8 no leukocytosis, drainage from vac site appears more purulent- will culture and replace prevena, may need to consider I+D 9 cont new TF's 10 rehab and pulm hygiene as able 11 lovenox  for DVT ppx 12 conts unasyn    LOS: 17 days    Lindi Revering PA-C Pager [phone] 09/05/2023   Chart reviewed, patient examined, agree with above.  He feels well overall. Had temp spike to 101.4 last pm but 98.7 this am. He said he felt fine and was surprised that he had fever. WBC normal. Remains on Unasyn . Drainage from [facility] site reportedly more purulent. 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 Still has a lot of chylous drainage from the right chest tube. Continue low fat tube feeds. CXR this am looked good. Aeration of right base looks good.

## 2023-09-05 NOTE — Progress Notes (Signed)
 Nutrition Brief Note  Per RN report, patient tolerating transition to Vivonex. Running at goal rate of 14ml/hr. Pt also endorses this at bedside. MCT supplementation started, per MD, today at 15ml TID, per tube.   Increase in chest tube output noted yesterday. More purulent in appearance. Wound vac in place. May consider I+D, per cardiothoracic surgery. If output continues to increase, may need to return to TPN.   Alerted patient to initiation of MCT supplementation and why. He verbalizes understanding. No new concerns from him at this time.    Estimated Nutritional Needs:  Kcal:  2200-2400kcal Protein:  115-130g Fluid:  >=2L/day  Wt Readings from Last 15 Encounters:  09/05/23 87.1 kg  08/19/23 77.4 kg  07/24/23 82.1 kg  07/18/23 83 kg  07/11/23 83 kg  07/03/23 80.2 kg  06/30/23 79.4 kg  06/12/23 82.6 kg  05/30/23 80.6 kg  05/22/23 81 kg  05/16/23 82.1 kg  05/13/23 80.9 kg  05/09/23 82.6 kg  04/25/23 82.1 kg  04/25/23 81.9 kg   INTERVENTION:  Continue tube feeding via J-tube: Vivonex RTF at 70 ml/h (1680 ml per day) ProSource TF20 60ml BID  Provides 1840 kcal (84% estimated needs), 124 gm protein (108% estimated needs), 1424 ml free water  daily, and 19g fat   RD to order meal trays to ensure low fat content - completed through Monday 4/21 Continue Boost Breeze po once daily, each supplement provides 250 kcal and 9 grams of protein   Monitor diet advancement and tolerance as well as the need for low fat Monitor output and assess for TPN appropriateness   NUTRITION DIAGNOSIS:  Severe Malnutrition related to chronic illness (esophageal cancer s/p chemo, radiation and surgery) as evidenced by severe fat depletion, severe muscle depletion, percent weight loss.- remains applicable   GOAL:  Patient will meet greater than or equal to 90% of their needs - progressing   MONITOR:  PO intake, Diet advancement, TF tolerance   Con Decant MS, RD, LDN Registered  Dietitian Clinical Nutrition RD Inpatient Contact Info in Amion

## 2023-09-06 ENCOUNTER — Inpatient Hospital Stay (HOSPITAL_COMMUNITY)

## 2023-09-06 LAB — GLUCOSE, CAPILLARY
Glucose-Capillary: 146 mg/dL — ABNORMAL HIGH (ref 70–99)
Glucose-Capillary: 182 mg/dL — ABNORMAL HIGH (ref 70–99)
Glucose-Capillary: 210 mg/dL — ABNORMAL HIGH (ref 70–99)
Glucose-Capillary: 163 mg/dL — ABNORMAL HIGH (ref 70–99)
Glucose-Capillary: 96 mg/dL (ref 70–99)

## 2023-09-06 MED ORDER — VIVONEX RTF PO LIQD
1000.0000 mL | ORAL | Status: DC
  Administered 2023-09-06 (×2): 1000 mL via ORAL
  Filled 2023-09-06 (×2): qty 1000

## 2023-09-06 NOTE — Progress Notes (Addendum)
      [address]       [place] [address]             [phone]       17 Days Post-Op Procedure(s) (LRB): VIDEO ASSISTED THORACOSCOPY (VATS)/DECORTICATION (Right) EGD (ESOPHAGOGASTRODUODENOSCOPY) (N/A)  Subjective: Patient states new tube feedings make him feel full and cause diarrhea  Objective: Vital signs in last 24 hours: Temp:  [98.4 F (36.9 C)-99.6 F (37.6 C)] (P) 98.4 F (36.9 C) (04/19 0803) Pulse Rate:  [84-100] 93 (04/19 0339) Cardiac Rhythm: Normal sinus rhythm (04/18 1900) Resp:  [18-20] (P) 20 (04/19 0803) BP: (118-131)/(69-72) 126/71 (04/19 0339) SpO2:  [91 %-96 %] 96 % (04/19 0339) Weight:  [79.5 kg] 79.5 kg (04/19 0339)     Intake/Output from previous day: 04/18 0701 - 04/19 0700 In: 2120 [NG/GT:1470; IV Piggyback:500] Out: 2150 [Urine:1850; Chest Tube:300]   Physical Exam:  Cardiovascular: RRR Pulmonary: Slightly diminished bibasilar breath sounds Abdomen: Soft, non tender, bowel sounds present. Extremities: Mild bilateral lower extremity edema. Wounds: Prevena on right lateral chest wound (purulence noted yesterday, Prevena changed). Mild erythema around chest tube (skin irritation), maceration around J tube (has had) Chest Tube: Milky like drainage from right chest tube (chyle)  Lab Results: CBC: Recent Labs    09/04/23 0332 09/05/23 0255  WBC 6.7 5.5  HGB 8.1* 7.9*  HCT 24.9* 24.4*  PLT 305 327   BMET:  Recent Labs    09/04/23 0332 09/05/23 0255  NA 138 137  K 4.3 4.3  CL 105 103  CO2 26 26  GLUCOSE 98 175*  BUN 25* 25*  CREATININE 1.44* 1.43*  CALCIUM  7.2* 7.5*    PT/INR: No results for input(s): "LABPROT", "INR" in the last 72 hours. ABG:  INR: Will add last result for INR, ABG once components are confirmed Will add last 4 CBG results once components are confirmed  Assessment/Plan:  1. CV - SR. On Amlodipine  10 mg daily and Lopressor  25 mg bid. 2.  Pulmonary - On room air. Chest tube with 300  cc milky like fluid (treating as a chylothorax). 3. ID-on Unasyn . No more fever. 4.GI-on Dysphagia I diet,TFs (low fat) 5. DM-CBGs 101/146/182. On scheduled Insulin . 6. Anemia-Last H and H 7.9 and 24.4 7. On Lovenox  for DVT prophylaxis  Donielle M ZimmermanPA-C 09/06/2023,8:25 AM  Patient seen and examined, agree with findings and plan outlined above Still with chylous appearing drainage from CT Afebrile yesterday C/o diarrhea- will cut back on Tf rate and monitor  Arletta Lumadue C. Luna Salinas, MD [facility] [phone]

## 2023-09-06 NOTE — Plan of Care (Signed)
  Problem: Coping: Goal: Ability to adjust to condition or change in health will improve Outcome: Progressing   Problem: Fluid Volume: Goal: Ability to maintain a balanced intake and output will improve Outcome: Progressing   Problem: Health Behavior/Discharge Planning: Goal: Ability to identify and utilize available resources and services will improve Outcome: Progressing Goal: Ability to manage health-related needs will improve Outcome: Progressing   Problem: Metabolic: Goal: Ability to maintain appropriate glucose levels will improve Outcome: Progressing   Problem: Nutritional: Goal: Maintenance of adequate nutrition will improve Outcome: Progressing Goal: Progress toward achieving an optimal weight will improve Outcome: Progressing   Problem: Tissue Perfusion: Goal: Adequacy of tissue perfusion will improve Outcome: Progressing   Problem: Health Behavior/Discharge Planning: Goal: Ability to manage health-related needs will improve Outcome: Progressing   Problem: Clinical Measurements: Goal: Ability to maintain clinical measurements within normal limits will improve Outcome: Progressing Goal: Will remain free from infection Outcome: Progressing Goal: Diagnostic test results will improve Outcome: Progressing

## 2023-09-07 LAB — GLUCOSE, CAPILLARY
Glucose-Capillary: 117 mg/dL — ABNORMAL HIGH (ref 70–99)
Glucose-Capillary: 128 mg/dL — ABNORMAL HIGH (ref 70–99)
Glucose-Capillary: 165 mg/dL — ABNORMAL HIGH (ref 70–99)
Glucose-Capillary: 172 mg/dL — ABNORMAL HIGH (ref 70–99)
Glucose-Capillary: 73 mg/dL (ref 70–99)
Glucose-Capillary: 75 mg/dL (ref 70–99)

## 2023-09-07 LAB — CHOLESTEROL, BODY FLUID: Cholesterol, Fluid: 16 mg/dL

## 2023-09-07 MED ORDER — VIVONEX RTF PO LIQD
1000.0000 mL | ORAL | Status: DC
  Administered 2023-09-07: 1000 mL via ORAL
  Filled 2023-09-07 (×2): qty 1000

## 2023-09-07 NOTE — Progress Notes (Signed)
      [address]       [place] [address]             [phone]       18 Days Post-Op Procedure(s) (LRB): VIDEO ASSISTED THORACOSCOPY (VATS)/DECORTICATION (Right) EGD (ESOPHAGOGASTRODUODENOSCOPY) (N/A)  Subjective: Patient with less diarrhea and does not feel as full since TFs decreased.   Objective: Vital signs in last 24 hours: Temp:  [98.6 F (37 C)-99.2 F (37.3 C)] 98.7 F (37.1 C) (04/20 0400) Pulse Rate:  [90-103] 92 (04/20 0500) Cardiac Rhythm: Normal sinus rhythm (04/19 1907) Resp:  [19-25] 25 (04/20 0500) BP: (118-137)/(67-74) 130/72 (04/20 0400) SpO2:  [92 %-98 %] 92 % (04/20 0500) Weight:  [81.2 kg] 81.2 kg (04/20 0500)     Intake/Output from previous day: 04/19 0701 - 04/20 0700 In: 1316.5 [P.O.:60; I.V.:10; NG/GT:1186.5] Out: 1230 [Urine:1000; Chest Tube:230]   Physical Exam:  Cardiovascular: RRR Pulmonary: Slightly diminished bibasilar breath sounds R>L Abdomen: Soft, non tender, bowel sounds present. Extremities: Mild bilateral lower extremity edema. Wounds: Prevena on right lateral chest wound (purulence noted when Prevena changed). Mild erythema around chest tube (skin irritation), maceration around J tube (has had) Chest Tube: Milky like drainage from right chest tube (chyle)  Lab Results: CBC: Recent Labs    09/05/23 0255  WBC 5.5  HGB 7.9*  HCT 24.4*  PLT 327   BMET:  Recent Labs    09/05/23 0255  NA 137  K 4.3  CL 103  CO2 26  GLUCOSE 175*  BUN 25*  CREATININE 1.43*  CALCIUM  7.5*    PT/INR: No results for input(s): "LABPROT", "INR" in the last 72 hours. ABG:  INR: Will add last result for INR, ABG once components are confirmed Will add last 4 CBG results once components are confirmed  Assessment/Plan:  1. CV - SR. On Amlodipine  10 mg daily and Lopressor  25 mg bid. 2.  Pulmonary - On room air. Chest tube with 230 cc milky like fluid (treating as a chylothorax). 3. ID-on Unasyn . No more  fever. 4.GI-on Dysphagia I diet,TFs (low fat) at 35 ml/hr (decreased yesterday secondary to diarrhea). Will discuss with surgeon if should increase to 45 ml/hr  5. DM-CBGs 128/75/172. On scheduled Insulin . 6. Anemia-Last H and H 7.9 and 24.4 7. On Lovenox  for DVT prophylaxis  Seth Ringel M ZimmermanPA-C 09/07/2023,8:20 AM

## 2023-09-08 ENCOUNTER — Inpatient Hospital Stay (HOSPITAL_COMMUNITY)

## 2023-09-08 LAB — GLUCOSE, CAPILLARY
Glucose-Capillary: 108 mg/dL — ABNORMAL HIGH (ref 70–99)
Glucose-Capillary: 120 mg/dL — ABNORMAL HIGH (ref 70–99)
Glucose-Capillary: 127 mg/dL — ABNORMAL HIGH (ref 70–99)
Glucose-Capillary: 137 mg/dL — ABNORMAL HIGH (ref 70–99)
Glucose-Capillary: 141 mg/dL — ABNORMAL HIGH (ref 70–99)
Glucose-Capillary: 167 mg/dL — ABNORMAL HIGH (ref 70–99)
Glucose-Capillary: 94 mg/dL (ref 70–99)

## 2023-09-08 LAB — BASIC METABOLIC PANEL WITH GFR
Anion gap: 8 (ref 5–15)
BUN: 23 mg/dL (ref 8–23)
CO2: 27 mmol/L (ref 22–32)
Calcium: 7.4 mg/dL — ABNORMAL LOW (ref 8.9–10.3)
Chloride: 102 mmol/L (ref 98–111)
Creatinine, Ser: 1.4 mg/dL — ABNORMAL HIGH (ref 0.61–1.24)
GFR, Estimated: 55 mL/min — ABNORMAL LOW (ref >60)
Glucose, Bld: 140 mg/dL — ABNORMAL HIGH (ref 70–99)
Potassium: 3.6 mmol/L (ref 3.5–5.1)
Sodium: 137 mmol/L (ref 135–145)

## 2023-09-08 MED ORDER — MEDIUM CHAIN TRIGLYCERIDES PO OIL
15.0000 mL | TOPICAL_OIL | Freq: Three times a day (TID) | ORAL | Status: DC
  Administered 2023-09-08 – 2023-09-14 (×16): 15 mL via ORAL
  Filled 2023-09-08 (×20): qty 15

## 2023-09-08 MED ORDER — THIAMINE MONONITRATE 100 MG PO TABS
100.0000 mg | ORAL_TABLET | Freq: Every day | ORAL | Status: DC
  Administered 2023-09-09 – 2023-09-15 (×6): 100 mg via ORAL
  Filled 2023-09-08 (×6): qty 1

## 2023-09-08 MED ORDER — VIVONEX RTF PO LIQD
1000.0000 mL | ORAL | Status: DC
  Administered 2023-09-08 – 2023-09-11 (×4): 1000 mL via ORAL
  Filled 2023-09-08 (×6): qty 1000

## 2023-09-08 MED ORDER — NUTRISOURCE FIBER PO PACK
1.0000 | PACK | Freq: Two times a day (BID) | ORAL | Status: DC
  Administered 2023-09-08 – 2023-09-15 (×13): 1
  Filled 2023-09-08 (×15): qty 1

## 2023-09-08 MED ORDER — ADULT MULTIVITAMIN W/MINERALS CH
1.0000 | ORAL_TABLET | Freq: Every day | ORAL | Status: DC
  Filled 2023-09-08 (×5): qty 1

## 2023-09-08 NOTE — Progress Notes (Signed)
 Nutrition Follow-up  DOCUMENTATION CODES:   Severe malnutrition in context of chronic illness  INTERVENTION:  TF infusing via J-tube: Vivonex at 55ml/hr (1320ml per day) 60ml ProSource TF20 BID Providing 1480 kcal (67% minimum estimated needs), 106g protein (92% of minimum estimated needs) and per day, 15g fat  Continue advancing TF back to goal as tolerated, per MD: Goal TF regimen- Vivonex at 70ml/hr (1680 ml per day) ProSource TF20 60ml BID TF at goal provides- 1840 kcal (84% estimated needs), 124g protein (108% estimated needs), 1424 ml free water  daily, and 19g fat   Recommend re-initiating TPN tomorrow if unable to further advance TF to goal rate; TPN to provide calories and protein but no fat with limitations r/t chylothorax and minimal fat being provided via TF - Discussed with CTS PA, plans to discussed with MD  Add nutrisource fiber BID to provide 3g soluble fiber per serving to aid in bulking of stool  Discontinue Boost Breeze  NUTRITION DIAGNOSIS:   Severe Malnutrition related to chronic illness (esophageal cancer s/p chemo, radiation and surgery) as evidenced by severe fat depletion, severe muscle depletion, percent weight loss. 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 Returned hours after discharge with c/o chest pain. PMH: DM, HTN, dyslipedemia, CKD, IgG kappa MGUS.  Previous Admission 3/10 EGD, XI Robotic Assisted Ivor Lewis Esophagectomy, J-tube placement 3/11 Trickle TF initiated 3/12 Surgery increased TF to 40 ml/hr 3/13 Surgery increased TF to 55 ml/hr, transferred to ICU  3/14 Surgery  increased TF to 65 ml/hr (goal), bowel regimen added and +BM 3/15 Intubated, Bronch (Thick mucus in trachea suctioned, partially occluding L mainstem, ?aspiration, recurrent false vocal cord irritation with swelling leading to stridor and resp failure) 3/17 Extubated 3/18 Esophogram:  Brisk tracheal aspiration of ingested contrast, into the LEFT mainstem bronchus. SLP eval recommended. Stagnant contrast at diaphragmatic hiatus. No anastomotic leak.  3/19 Diet advanced to Dysphagia 2/Thins, later downgraded to Nectar Thick Liquids 3/20 Transition to Nocturnal TF, family to bring protein shake in from home 3/21 Significant diarrhea overnight, (9 stools in 10 hours), significant J-tube drainage, change to Vivonex with day time feedings 3/24 Diet changed to Dysphagia 2, Thins per SLP 3/26 NPO due to concern for possible esophageal leak 3/27 advanced to Dysphagia 3, thin liquids  3/31 modify TF regimen for d/c 4/1 discharged home Current Admission 4/2 admitted; OR: R VATS, EGD, and esophageal stent placement 4/3 trial Johny Nap TF formula during the day (16 hours), transfusion 4/4 advanced to goal rate; leaking J-tube, TF held  4/5 txr to cardiovascular ICU 4/7 TF re-initiated, transfused  4/9 J-tube replaced by IR 4/10 significant leaking from J-tube; TFs on hold  4/11 PICC placed, TPN initiated, J-tube replaced again, WOUND VAC 4/12 re-initiate trickles s/p second J-tube replacement 4/13 PICC line replaced 4/14 slowly advance TF goal rate 4/16 TF to goal rate; TPN discontinued 4/17 chylothorax: transition to Vivonex and very low fat diet  Pt having a BM at time of visit, which nursing student reports to be liquid and oily in appearance. 1 liquid stool documented over night however per discussion with RN, pt had multiple loose stools  over night per report. Question whether pt is having a degree of malabsorption.  Spoke with wife and daughter outside room. They report that pt is a very  "picky eater." He continues to decline hospital food and boost breeze that is ordered. They feel that he would have better nutritional intake at home where he can pick and choose the foods that he enjoys. Wife mentions that she had brought in a few grapes and cantaloupe but that he only ate about 7 pieces total and she feels that he does well chewing his foods very well. Encouraged compliance with current diet and that it would need to be discussed with MD regarding advancement of diet order.   Spoke with family about adding back fiber supplement to aid in bulking of stool. PA discussed with pt prior to RD visit and pt was amenable at that time. Pt's daughter concerned that this causes dumping syndrome. Explained this is not a bowel regimen and that it is a fiber supplement to help slow down stool output.   Reached out to PA regarding nutrition interventions and concerns. TF had been decreased over the weekend d/t abdominal discomfort, bloating and loose stools. TF advanced back to 73ml/hr today. She reports that per discussion with MD, no further advancement of TF at this time. MD prefers to hold on further diet advancement in setting of stent placement. Recommended resuming supplemental TPN tomorrow to better meet pt's nutritional needs as RD does not suspect pt will meet any additional needs via PO intake and enteral nutrition not adequately meeting nutrition needs at this time. PA to discuss with MD and assess tomorrow.   Admit weight: 77.1 kg Current weight: 78.8 kg  Chest tube: x24 hours per nursing documentation output x24 hours per PA observation Reports that is it more chunky and purulent versus milky  Medications: SSI 0-20 units q4h, SSI 3 units q4h, semglee  14 units daily, MCT oil, MVI, octreotide , thiamine   Labs:  Cr 1.40 GFR 55 CBG's 73-167 x24 hours  Diet Order:   Diet Order             DIET - DYS 1 Room service appropriate? Yes; Fluid consistency: Thin  Diet  effective now                   EDUCATION NEEDS:   Education needs have been addressed  Skin:  Incisions: R chest tube Other: MASD around J-tube  Last BM:  21 type 6 x2  Height:   Ht Readings from Last 1 Encounters:  08/20/23 5\' 10"  (1.778 m)    Weight:   Wt Readings from Last 1 Encounters:  09/08/23 78.8 kg   BMI:  Body mass index is 24.93 kg/m.  Estimated Nutritional Needs:   Kcal:  2200-2400kcal  Protein:  115-130g  Fluid:  >=2L/day  Rocklin Chute, RDN, LDN Clinical Nutrition See AMiON for contact information.

## 2023-09-08 NOTE — Progress Notes (Addendum)
      [address]       [place] [address]             [phone]    19 Days Post-Op Procedure(s) (LRB): VIDEO ASSISTED THORACOSCOPY (VATS)/DECORTICATION (Right) EGD (ESOPHAGOGASTRODUODENOSCOPY) (N/A)  Subjective:  Patient doing okay.  Had 2 episodes of diarrhea yesterday.  Denies N/V.  Objective: Vital signs in last 24 hours: Temp:  [98.5 F (36.9 C)-99.1 F (37.3 C)] 98.6 F (37 C) (04/21 0258) Pulse Rate:  [63-105] 93 (04/21 0404) Cardiac Rhythm: Normal sinus rhythm (04/20 1900) Resp:  [16-23] 23 (04/21 0404) BP: (116-133)/(67-77) 133/77 (04/21 0258) SpO2:  [90 %-97 %] 92 % (04/21 0404) Weight:  [78.8 kg] 78.8 kg (04/21 0404)  Intake/Output from previous day: 04/20 0701 - 04/21 0700 In: 2015.3 [NG/GT:1125.3; IV Piggyback:800] Out: 1575 [Urine:1275; Chest Tube:300]  General appearance: alert, cooperative, and no distress Heart: regular rate and rhythm Lungs: clear to auscultation bilaterally Abdomen: soft, non-tender; bowel sounds normal; no masses,  no organomegaly Extremities: extremities normal, atraumatic, no cyanosis or edema Wound: pravena in place on Mini Thoracotomy , J tube with significant improvement, minimal erythema present, surgical incisions well healed  Lab Results: No results for input(s): "WBC", "HGB", "HCT", "PLT" in the last 72 hours. BMET:  Recent Labs    09/08/23 0500  NA 137  K 3.6  CL 102  CO2 27  GLUCOSE 140*  BUN 23  CREATININE 1.40*  CALCIUM  7.4*    PT/INR: No results for input(s): "LABPROT", "INR" in the last 72 hours. 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 Deloise Ferries if IR coil embolization is possible..  Renal- CKD Stage 3 creatinine is stable at 1.40 GI- diarrhea remains issue, may benefit from fiber supplement to help thicken stool will discuss with nutrition today, remains on low fat tube feedings, will increase to 55 ml/hr today, continue MCT oil for chylothorax ID- afebrile, no leukocytosis resolved, continue pravena on right mini thorascopic incision Deconditioning- continue PT/OT Dm- cbgs are controlled   LOS: 20 days    [name], PA-C 09/08/2023  Agree  IR for lymphscintigraphy.  Continue tube feeds  Alvilda Mckenna O Jasia Hiltunen

## 2023-09-08 NOTE — Progress Notes (Signed)
 Mobility Specialist Progress Note:   09/08/23 1147  Mobility  Activity Ambulated with assistance in room;Ambulated with assistance in hallway  Level of Assistance Standby assist, set-up cues, supervision of patient - no hands on  Assistive Device Front wheel walker  Distance Ambulated (ft) 300 ft  Activity Response Tolerated well  Mobility Referral Yes  Mobility visit 1 Mobility  Mobility Specialist Start Time (ACUTE ONLY) 1138  Mobility Specialist Stop Time (ACUTE ONLY) 1147  Mobility Specialist Time Calculation (min) (ACUTE ONLY) 9 min   Pt received in bed, wife and daughter in room. Pt agreeable to mobility session. Ambulated in hallway with RW and SBA. Tolerated well, pt eager for d/c. VSS throughout. Returned pt to room, lying down with all needs met, call bell and phone in reach.   [name] Mobility Specialist Please contact via Special educational needs teacher or  Rehab office at [phone]

## 2023-09-09 LAB — GLUCOSE, CAPILLARY
Glucose-Capillary: 119 mg/dL — ABNORMAL HIGH (ref 70–99)
Glucose-Capillary: 149 mg/dL — ABNORMAL HIGH (ref 70–99)
Glucose-Capillary: 110 mg/dL — ABNORMAL HIGH (ref 70–99)
Glucose-Capillary: 158 mg/dL — ABNORMAL HIGH (ref 70–99)
Glucose-Capillary: 140 mg/dL — ABNORMAL HIGH (ref 70–99)

## 2023-09-09 MED ORDER — SODIUM CHLORIDE 0.9 % IV SOLN
2.0000 g | Freq: Once | INTRAVENOUS | Status: AC
  Administered 2023-09-11: 2 g via INTRAVENOUS
  Filled 2023-09-09: qty 20

## 2023-09-09 MED ORDER — SACCHAROMYCES BOULARDII 250 MG PO CAPS
250.0000 mg | ORAL_CAPSULE | Freq: Two times a day (BID) | ORAL | Status: DC
  Administered 2023-09-11 – 2023-09-15 (×7): 250 mg via ORAL
  Filled 2023-09-09 (×11): qty 1

## 2023-09-09 MED ORDER — ENOXAPARIN SODIUM 40 MG/0.4ML IJ SOSY
40.0000 mg | PREFILLED_SYRINGE | INTRAMUSCULAR | Status: AC
  Administered 2023-09-10: 40 mg via SUBCUTANEOUS
  Filled 2023-09-09: qty 0.4

## 2023-09-09 MED ORDER — ENOXAPARIN SODIUM 40 MG/0.4ML IJ SOSY
40.0000 mg | PREFILLED_SYRINGE | INTRAMUSCULAR | Status: DC
  Administered 2023-09-12 – 2023-09-15 (×4): 40 mg via SUBCUTANEOUS
  Filled 2023-09-09 (×4): qty 0.4

## 2023-09-09 NOTE — Progress Notes (Signed)
 Physical Therapy Treatment Patient Details Name: Seth Burch MRN: [id number] DOB: 06-Apr-1956 Today's Date: 09/09/2023   History of Present Illness Pt is a 68 y/o male presenting 4/1 due to acute onset of R chest pain after eating. Pt noted with AKI. 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 4/2 R VATS, EGD, esophageal stent placement, and chest tube placement. 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 PMHx: HTN, DM    PT Comments  Pt resting in bed on arrival, agreeable to therapy session, and continuing to make progress towards acute goals. Pt continues to be limited in safe mobility by lines/leads and wound vac, as well as impaired balance/postural reactions. Pt making good progress with activity tolerance, requiring grossly supervision for safety during gait with RW for support, and up to CGA for dynamic balance challenges. Pt requiring minA to complete bed mobility with assist needed to elevate trunk. Pt continues to benefit from skilled PT services to progress toward functional mobility goals.     If plan is discharge home, recommend the following: Assistance with cooking/housework;Assist for transportation;Help with stairs or ramp for entrance;A little help with walking and/or transfers;A little help with bathing/dressing/bathroom   Can travel by private vehicle        Equipment Recommendations  None recommended by PT    Recommendations for Other Services       Precautions / Restrictions Precautions Precautions: Fall;Other (comment) Precaution/Restrictions Comments: jejunostomy, R chest tube, wound vac Restrictions Weight Bearing Restrictions Per Provider Order: No     Mobility  Bed Mobility Overal bed mobility: Needs Assistance Bed Mobility: Supine to Sit, Sit  to Supine     Supine to sit: Min assist, HOB elevated Sit to supine: Contact guard assist   General bed mobility comments: MinA for slight assist to raise trunk    Transfers Overall transfer level: Needs assistance Equipment used: Rolling walker (2 wheels) Transfers: Sit to/from Stand Sit to Stand: Supervision           General transfer comment: supervision for safety/lines    Ambulation/Gait Ambulation/Gait assistance: Supervision, Contact guard assist Gait Distance (Feet): 500 Feet Assistive device: Rolling walker (2 wheels) Gait Pattern/deviations: WFL(Within Functional Limits) Gait velocity: decr     General Gait Details: steady with RW, grossly supervision with up to CGA to steady with dynamic balance challenges   Stairs             Wheelchair Mobility     Tilt Bed    Modified Rankin (Stroke Patients Only)       Balance Overall balance assessment: Needs assistance Sitting-balance support: No upper extremity supported, Feet supported Sitting balance-Leahy Scale: Good     Standing balance support: Reliant on assistive device for balance Standing balance-Leahy Scale: Fair Standing balance comment: able to stand statically with no AD, RW for gait             High level balance activites: Head turns High Level Balance Comments: some mild c/o dizzines with head turns on angle up/down with CGA needed to steady with RW for support, steady with head turns side/side and up/down            Communication Communication Communication: No apparent difficulties  Cognition Arousal: Alert Behavior During Therapy: WFL for tasks assessed/performed   PT - Cognitive impairments: No apparent impairments  Following commands: Intact      Cueing Cueing Techniques: Verbal cues  Exercises      General Comments General comments (skin integrity, edema, etc.): VSS on RA      Pertinent Vitals/Pain Pain  Assessment Pain Assessment: No/denies pain Pain Intervention(s): Monitored during session    Home Living                          Prior Function            PT Goals (current goals can now be found in the care plan section) Acute Rehab PT Goals Patient Stated Goal: to go home PT Goal Formulation: With patient Time For Goal Achievement: 09/17/23 Progress towards PT goals: Progressing toward goals    Frequency    Min 2X/week      PT Plan      Co-evaluation              AM-PAC PT "6 Clicks" Mobility   Outcome Measure  Help needed turning from your back to your side while in a flat bed without using bedrails?: A Little Help needed moving from lying on your back to sitting on the side of a flat bed without using bedrails?: A Little Help needed moving to and from a bed to a chair (including a wheelchair)?: A Little Help needed standing up from a chair using your arms (e.g., wheelchair or bedside chair)?: A Little Help needed to walk in hospital room?: A Little Help needed climbing 3-5 steps with a railing? : A Little 6 Click Score: 18    End of Session   Activity Tolerance: Patient tolerated treatment well Patient left: in bed;with call bell/phone within reach;Other (comment) (MD in room) Nurse Communication: Mobility status PT Visit Diagnosis: Other abnormalities of gait and mobility (R26.89);Unsteadiness on feet (R26.81)     Time: 1610-9604 PT Time Calculation (min) (ACUTE ONLY): 17 min  Charges:    $Therapeutic Activity: 8-22 mins PT General Charges $$ ACUTE PT VISIT: 1 Visit                     Pranavi Aure R. PTA Acute Rehabilitation Services Office: [phone]   [name] 09/09/2023, 3:47 PM

## 2023-09-09 NOTE — Progress Notes (Addendum)
 Nutrition Brief Note  Pt with some GI upset over the weekend and TF rate reduced. Currently still running at 39ml/hr. He is not meeting his estimated needs at this time. Spoke with surgical PA-C, Erin. Stating there are no updates today. MD with guidance to continue and hold at current rate. No TPN at this time. PA-C reporting she may try to increase rate to 50ml/hr tomorrow. Consult to IR placed and tentative lumphscintgraphy scheduled for Thursday. She also endorses reiterating yesterday's education from RD regarding whole foods and risk associated with consuming inappropriate textures as it relates to stent position.   Drains/Lines: PICC (placed 4/11 and replaced 4/13) LUQ: Jejunostomy (16 Fr) R Lateral Pleural (19Fr): x24 hours Wound vac to R chest (placed 4/11): no documented output to review UOP: x24 hours  Of note, patient previously tolerating Vivonex. Spoke with PA-C who attributes this most recent bout of diarrhea to another round of ABX completed. Will add Florastor. No N/V or new complaints today. One episode of diarrhea yesterday. FiberSource added at that time.  Estimated Nutritional Needs:  Kcal:  2200-2400kcal Protein:  115-130g Fluid:  >=2L/day  Wt Readings from Last 15 Encounters:  09/09/23 76.5 kg  08/19/23 77.4 kg  07/24/23 82.1 kg  07/18/23 83 kg  07/11/23 83 kg  07/03/23 80.2 kg  06/30/23 79.4 kg  06/12/23 82.6 kg  05/30/23 80.6 kg  05/22/23 81 kg  05/16/23 82.1 kg  05/13/23 80.9 kg  05/09/23 82.6 kg  04/25/23 82.1 kg  04/25/23 81.9 kg   INTERVENTION:  TF infusing via J-tube: Vivonex at 63ml/hr ( per day) 60ml ProSource TF20 BID  Providing 1480 kcal (67% minimum estimated needs), 106g protein (92% of minimum estimated needs) and per day, 15g fat   Continue advancing TF back to goal as tolerated, per MD: Goal TF regimen- Vivonex at 70ml/hr (1680 ml per day) ProSource TF20 60ml BID  TF at goal provides- 1840 kcal (84%  estimated needs), 124g protein (108% estimated needs), 1424 ml free water  daily, and 19g fat    Recommend re-initiating TPN tomorrow if unable to further advance TF to goal rate; TPN to provide calories and protein but no fat with limitations r/t chylothorax and minimal fat being provided via TF - Discussed with CTS PA, plans to discussed with MD   Continue nutrisource fiber BID to provide 3g soluble fiber per serving to aid in bulking of stool  Add Florastor for gut health and normalization of bowels     NUTRITION DIAGNOSIS:  Severe Malnutrition related to chronic illness (esophageal cancer s/p chemo, radiation and surgery) as evidenced by severe fat depletion, severe muscle depletion, percent weight loss. - remains applicable   GOAL:  Patient will meet greater than or equal to 90% of their needs - goal unmet   MONITOR:  PO intake, Diet advancement, TF tolerance  Con Decant MS, RD, LDN Registered Dietitian Clinical Nutrition RD Inpatient Contact Info in Amion

## 2023-09-09 NOTE — TOC Progression Note (Signed)
 Transition of Care [facility]) - Progression Note    Patient Details  Name: FREMAN LAPAGE MRN: [id number] Date of Birth: 1955-08-20  [facility] CM/SW Contact  Omie Bickers, RN Phone Number: 09/09/2023, 10:59 AM  Clinical Narrative:     TOC continues to follow for medical readiness for DC  Barriers include but not limited to Chest tube, Chylothorax, nutrition- ?start TPN, diarrhea    Expected Discharge Plan: Home w Home Health Services Barriers to Discharge: Continued Medical Work up  Expected Discharge Plan and Services   Discharge Planning Services: CM Consult Post Acute Care Choice: Home Health Living arrangements for the past 2 months: Single Family Home                           HH Arranged: RN, PT, OT, Speech Therapy HH Agency: [facility]         Social Determinants of Health (SDOH) Interventions SDOH Screenings   Food Insecurity: No Food Insecurity (08/21/2023)  Housing: Low Risk  (08/21/2023)  Transportation Needs: No Transportation Needs (08/21/2023)  Utilities: Not At Risk (08/21/2023)  Depression (PHQ2-9): Low Risk  (06/12/2023)  Social Connections: Moderately Integrated (08/21/2023)  Tobacco Use: Low Risk  (08/20/2023)    Readmission Risk Interventions     No data to display

## 2023-09-09 NOTE — Consult Note (Signed)
 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 Team is requesting a chylothorax duct embolization with general anethesia   Referring Physician(s): Dr. Deloise Ferries  Supervising Physician: Myrlene Asper  Patient Status: [facility] - Out-pt  History of Present Illness: Seth Burch is a 68 y.o. male inpatient. 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  Patient admitted for concern for an esophageal leak. VATS / EGD on 4.2.25. Stent placement performed on 4.2.25 during EGD shows no perforation or fistula. Hospital stay complicated by intermittent large volume (310 ml overnight) white output thought to be chyle in nature. Cholesterol from pleural fluid from 4.17.25 16. Triglycerides  charted as (serum) from 4.14.25 73 and 3.16.25 184. Additional labs currently pending from OSH facility. triglycerides say blood 73. 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 Case reviewed and approved by IR Attending Dr. Myrlene Asper. Procedure scheduled with general anesthesia.   Patient seen at bedside with Dr. Mabel Savage at bedside. Wife present. Currently without any significant complaints. Patient alert and laying in bed,calm. Denies any fevers, headache, chest pain, SOB, cough, abdominal pain, nausea, vomiting or bleeding.   Cr 1.4, BUN 23, GFR 55. Patient is on subcutaneous prophylactic dose of lovenox . NKDA.  Return precautions and treatment recommendations and follow-up discussed with the patient and his wife. Both who are agreeable with the plan.      Past Medical History:  Diagnosis Date   Arthritis    Cancer (HCC)    Chronic kidney disease    Diabetes mellitus (HCC)    Dyslipidemia    Hypertension     Past Surgical History:  Procedure Laterality Date    ESOPHAGOGASTRODUODENOSCOPY N/A 07/28/2023   Procedure: EGD (ESOPHAGOGASTRODUODENOSCOPY);  Surgeon: Hilarie Lovely, MD;  Location: [facility] OR;  Service: Thoracic;  Laterality: N/A;   ESOPHAGOGASTRODUODENOSCOPY N/A 08/20/2023   Procedure: EGD (ESOPHAGOGASTRODUODENOSCOPY);  Surgeon: Hilarie Lovely, MD;  Location: [facility] OR;  Service: Thoracic;  Laterality: N/A;   ESOPHAGOGASTRODUODENOSCOPY (EGD) WITH PROPOFOL  N/A 04/04/2023   Procedure: ESOPHAGOGASTRODUODENOSCOPY (EGD) WITH PROPOFOL ;  Surgeon: Alvis Jourdain, MD;  Location: [facility] ENDOSCOPY;  Service: Gastroenterology;  Laterality: N/A;   IR IMAGING GUIDED PORT INSERTION  04/09/2023   IR REPLC GASTRO/COLONIC TUBE PERCUT W/FLUORO  08/27/2023   IR REPLC GASTRO/COLONIC TUBE PERCUT W/FLUORO  08/29/2023   UPPER ESOPHAGEAL ENDOSCOPIC ULTRASOUND (EUS) N/A 04/04/2023   Procedure: UPPER ESOPHAGEAL ENDOSCOPIC ULTRASOUND (EUS);  Surgeon: Alvis Jourdain, MD;  Location: [facility];  Service: Gastroenterology;  Laterality: N/A;   VIDEO ASSISTED THORACOSCOPY (VATS)/DECORTICATION Right 08/20/2023   Procedure: VIDEO ASSISTED THORACOSCOPY (VATS)/DECORTICATION;  Surgeon: Hilarie Lovely, MD;  Location: [facility] OR;  Service: Thoracic;  Laterality: Right;    Allergies: Patient has no known allergies.  Medications: Prior to Admission medications   Medication Sig Start Date End Date Taking? Authorizing Provider  acetaminophen  (TYLENOL ) 325 MG tablet Take 1-2 tablets (325-650 mg total) by mouth every 4 (four) hours as needed. 08/19/23 08/18/24 Yes Barrett, Erin R, PA-C  amLODipine  (NORVASC ) 10 MG tablet Take 1 tablet (10 mg total) by mouth daily. 08/19/23  Yes Barrett, Malachy Scripture, PA-C  aspirin EC 81 MG tablet Take 81 mg by mouth daily.   Yes [provider]  atorvastatin  (LIPITOR) 20 MG tablet Take 20 mg by mouth at bedtime. 02/26/19  Yes  [provider]  LANTUS  SOLOSTAR 100 UNIT/ML Solostar Pen Inject 26 Units into the skin daily. 02/19/23  Yes [provider]  Menthol-Methyl Salicylate (ICY HOT ORIGINAL PAIN RELIEF) 10-30 % CREA Apply 1 Application topically as needed (Rt Shoulder and back pain.).   Yes [provider]  metoprolol  tartrate (LOPRESSOR ) 25 MG tablet Take 0.5 tablets (12.5 mg total) by mouth 2 (two) times daily. 08/19/23  Yes Barrett, Erin R, PA-C  Nystatin (GERHARDT'S BUTT CREAM) CREA Apply 1 Application topically 3 (three) times daily. 08/19/23  Yes Barrett, Erin R, PA-C  omeprazole (PRILOSEC) 40 MG capsule Take 40 mg by mouth daily. 03/13/23  Yes [provider]  oxyCODONE  (OXY IR/ROXICODONE ) 5 MG immediate release tablet Take 1 tablet (5 mg total) by mouth every 4 (four) hours as needed for severe pain (pain score 7-10). 08/19/23  Yes Barrett, Erin R, PA-C  Protein (FEEDING SUPPLEMENT, PROSOURCE TF20,) liquid Place 60 mLs into feeding tube 2 (two) times daily. 08/19/23  Yes Barrett, Erin R, PA-C  tamsulosin  (FLOMAX ) 0.4 MG CAPS capsule Take 0.4 mg by mouth daily. 02/08/23  Yes [provider]     History reviewed. No pertinent family history.  Social History   Socioeconomic History   Marital status: Married    Spouse name: Not on file   Number of children: Not on file   Years of education: Not on file   Highest education level: Not on file  Occupational History   Not on file  Tobacco Use   Smoking status: Never   Smokeless tobacco: Never  Substance and Sexual Activity   Alcohol  use: Yes    Alcohol /week: 1.0 standard drink of alcohol     Types: 1 Cans of beer per week   Drug use: Never   Sexual activity: Not on file  Other Topics Concern   Not on file  Social History Narrative   Not on file   Social Drivers of Health   Financial Resource Strain: Not on file  Food Insecurity: No Food Insecurity (08/21/2023)   Hunger Vital Sign    Worried About Running Out of Food in the Last Year: Never true    Ran Out of Food in the Last Year: Never true  Transportation Needs: No Transportation Needs (08/21/2023)    PRAPARE - Administrator, Civil Service (Medical): No    Lack of Transportation (Non-Medical): No  Physical Activity: Not on file  Stress: Not on file  Social Connections: Moderately Integrated (08/21/2023)   Social Connection and Isolation Panel [NHANES]    Frequency of Communication with Friends and Family: More than three times a week    Frequency of Social Gatherings with Friends and Family: More than three times a week    Attends Religious Services: More than 4 times per year    Active Member of [employer] or Organizations: No    Attends Banker Meetings: Never    Marital Status: Married     Review of Systems: A 12 point ROS discussed and pertinent positives are indicated in the HPI above.  All other systems are negative.  Review of Systems  Constitutional:  Negative for fever.  HENT:  Negative for congestion.   Respiratory:  Negative for cough and shortness of breath.   Cardiovascular:  Negative for chest pain.  Gastrointestinal:  Negative for abdominal pain.  Neurological:  Negative for headaches.  Psychiatric/Behavioral:  Negative for behavioral problems and confusion.     Vital Signs: BP 131/71 (  BP Location: Left Arm)   Pulse 92   Temp 98.9 F (37.2 C) (Oral)   Resp 19   Ht 5\' 10"  (1.778 m)   Wt 168 lb 9.6 oz (76.5 kg)   SpO2 92%   BMI 24.19 kg/m   Advance Care Plan: The advanced care plan/surrogate decision maker was discussed at the time of visit and the patient did not wish to discuss or was not able to name a surrogate decision maker or provide an advance care plan.    Physical Exam Vitals and nursing note reviewed.  Constitutional:      Appearance: He is well-developed.  HENT:     Head: Normocephalic.  Pulmonary:     Effort: Pulmonary effort is normal.     Comments: Right chest tube present. Approximately 550 ml of white fluid noted to be in the atrium device.   Wound vac present to right incision site. Abdominal:     Comments:  J tube present   Musculoskeletal:        General: Normal range of motion.     Cervical back: Normal range of motion.  Skin:    General: Skin is warm and dry.  Neurological:     General: No focal deficit present.     Mental Status: He is alert and oriented to person, place, and time.  Psychiatric:        Mood and Affect: Mood normal.        Behavior: Behavior normal.     Imaging: DG CHEST PORT 1 VIEW Result Date: 09/08/2023 CLINICAL DATA:  Right chest tube, pleural effusion EXAM: PORTABLE CHEST 1 VIEW COMPARISON:  09/06/2023 FINDINGS: Single frontal view of the chest demonstrates stable right chest wall port and esophageal stent. Right chest tube again noted, tip projecting over the right apex. Cardiac silhouette is stable. Patchy bibasilar consolidation unchanged. Stable trace bilateral pleural effusions. No pneumothorax. IMPRESSION: 1. Stable support devices. 2. Stable trace bilateral pleural effusions and patchy bibasilar consolidation favoring atelectasis. Electronically Signed   By: Bobbye Burrow M.D.   On: 09/08/2023 10:27   DG Chest Port 1 View Result Date: 09/06/2023 CLINICAL DATA:  [id number] Pleural effusion [id number] EXAM: PORTABLE CHEST 1 VIEW COMPARISON:  09/05/2023 FINDINGS: Stable cardiomediastinal contours. Esophageal stent. Stable right chest port and right PICC line. Small or right sided pleural drainage catheter remains in place. Persistent trace bilateral pleural effusions with mild streaky bibasilar opacities, likely atelectasis. No pneumothorax. IMPRESSION: Persistent trace bilateral pleural effusions with mild streaky bibasilar opacities, likely atelectasis. Stable lines and tubes. Electronically Signed   By: Leverne Reading D.O.   On: 09/06/2023 15:25   DG Chest Port 1 View Result Date: 09/05/2023 CLINICAL DATA:  Pleural effusion EXAM: PORTABLE CHEST 1 VIEW COMPARISON:  Chest radiograph dated 09/03/2023 FINDINGS: Lines/tubes: Right chest wall port tip projects over the right  atrium. Similar positioning of esophageal stent. Lungs: Low lung volumes with bronchovascular crowding. Bibasilar hazy and patchy opacities. Mild interlobular septal thickening. Pleura: Slightly increased small right and similar trace left pleural effusions. No pneumothorax. Heart/mediastinum: Similar  cardiomediastinal silhouette. Bones: No acute osseous abnormality. IMPRESSION: 1. Slightly increased small right and similar trace left pleural effusions. 2. Bibasilar hazy and patchy opacities, likely atelectasis. 3. Mild interlobular septal thickening, likely pulmonary edema. Electronically Signed   By: Limin  Xu M.D.   On: 09/05/2023 10:41   DG Chest 1 View Result Date: 09/03/2023 CLINICAL DATA:  Esophagectomy EXAM: CHEST  1 VIEW COMPARISON:  08/31/2023 FINDINGS: Expandable wire  stent extends from the thoracic inlet to the diaphragmatic hiatus. No change from prior. Port in the anterior chest wall with tip in distal SVC. Mild RIGHT basilar atelectasis.  No effusion.  No pneumothorax. IMPRESSION: 1. No change from prior.  Stable support apparatus 2. Mild RIGHT basilar atelectasis. Electronically Signed   By: Deboraha Fallow M.D.   On: 09/03/2023 11:37   US  EKG SITE RITE Result Date: 08/31/2023 If Site Rite image not attached, placement could not be confirmed due to current cardiac rhythm.  DG CHEST PORT 1 VIEW Result Date: 08/31/2023 CLINICAL DATA:  [id number] Chest tube in place [id number] EXAM: PORTABLE CHEST - 1 VIEW COMPARISON:  the previous day's study FINDINGS: Right tunneled pleural drain stable. No significant effusion. Right IJ port catheter stable. Right arm PICC to the SVC. Mid esophageal stent. Stable left retrocardiac and right basilar pulmonary opacities. Heart size and mediastinal contours are within normal limits. No pneumothorax. Visualized bones unremarkable. IMPRESSION: 1. Stable support apparatus. 2. Stable bibasilar pulmonary opacities. No pneumothorax. Electronically Signed   By: Nicoletta Barrier  M.D.   On: 08/31/2023 11:23   DG CHEST PORT 1 VIEW Result Date: 08/30/2023 CLINICAL DATA:  PICC EXAM: PORTABLE CHEST 1 VIEW COMPARISON:  Chest x-ray 08/29/2023.  CT chest 08/19/2023. FINDINGS: Right chest port catheter tip projects over the distal SVC. Right upper extremity PICC is present with distal tip at the level of the distal SVC. There is no pneumothorax. Half stent is seen in the esophagus, unchanged. Right chest tube is unchanged in position. Small right pleural effusion and opacities in the right lung base persists. Left lung is clear. Cardiomediastinal silhouette is stable. IMPRESSION: 1. Right upper extremity PICC with distal tip at the level of the distal SVC. 2. Right chest tube unchanged in position. No pneumothorax. 3. Small right pleural effusion and opacities in the right lung base persists. Electronically Signed   By: Tyron Gallon M.D.   On: 08/30/2023 16:01   IR REPLACE GASTRO/JEJUNO TUBE PERC W/FLUORO Result Date: 08/29/2023 INDICATION: Leaking enteric tube EXAM: Jejunostomy tube exchange MEDICATIONS: None.; ANESTHESIA/SEDATION: None. CONTRAST:  10 mL Omnipaque  300-administered into the enteric tube FLUOROSCOPY: Radiation Exposure Index (as provided by the fluoroscopic device):12 seconds; 3 mGy COMPLICATIONS: None immediate. PROCEDURE: Informed written consent was obtained from the patient and/or patient's representative after a discussion of the risks, benefits and alternatives to treatment. Questions regarding the procedure were encouraged and answered. A timeout was performed prior to the initiation of the procedure. The abdomen and external portion of the existing jejunostomy tube was prepped and draped in the usual sterile fashion, and a sterile drape was applied covering the operative field. Maximum barrier sterile technique with sterile gowns and gloves were used for the procedure. A timeout was performed prior to the initiation of the procedure. TECHNIQUE: The existing jejunostomy  tube was injected with a small amount of contrast confirming appropriate positioning within the small bowel. The tube was cannulated with a Glidewire which was position within the distal small bowel. Over the stiff guide wire, the existing tube was exchanged for a new 20 Fr balloon inflatable jejunostomy tube. The balloon was inflated with saline and dilute contrast and pulled against the anterior inner lumen of the stomach and the external disc was secured with silk suture. Contrast was injected and a post procedural spot fluoroscopic image was obtained confirming appropriate positioning of the new jejunostomy tube. A dressing was applied. The patient tolerated the procedure well without immediate postprocedural complication. IMPRESSION:  Successful fluoroscopic guided exchange of a new 20 Fr balloon-inflatable jejunostomy tube. The tube is ready for immediate use. PLAN: Consider return to the IR in approximately 6 months for routine feeding tube evaluation and exchange. Electronically Signed   By: Reagan Camera M.D.   On: 08/29/2023 14:15   DG CHEST PORT 1 VIEW Result Date: 08/29/2023 CLINICAL DATA:  Pleural effusion EXAM: PORTABLE CHEST 1 VIEW COMPARISON:  Chest x-ray performed August 25, 2023 FINDINGS: Right-sided pleural drainage catheter with trace right effusion. Right-sided port. Esophageal stent. Trace left pleural effusion. No significant pneumothorax. Heart size is unchanged. IMPRESSION: 1. Trace right pleural effusion, improved from prior. 2. Trace left pleural effusion. Electronically Signed   By: Reagan Camera M.D.   On: 08/29/2023 11:11   US  EKG SITE RITE Result Date: 08/29/2023 If Site Rite image not attached, placement could not be confirmed due to current cardiac rhythm.  DG Abd 1 View Result Date: 08/28/2023 CLINICAL DATA:  Constipation EXAM: ABDOMEN - 1 VIEW COMPARISON:  None Available. FINDINGS: Jejunostomy feeding tube noted in the left abdomen with the tip in the lower abdomen.  Nonobstructive bowel gas pattern. No organomegaly or visible free air. No excessive stool burden. IMPRESSION: No acute findings. Electronically Signed   By: Janeece Mechanic M.D.   On: 08/28/2023 12:14   IR REPLACE GASTRO/JEJUNO TUBE PERC W/FLUORO Result Date: 08/27/2023 INDICATION: Exchange J tube for a new tube. Ensure that balloon stays up and gets a good seal EXAM: JEJUNOSTOMY TUBE REPLACEMENT COMPARISON:  CT CAP, 08/19/2023. MEDICATIONS: None. CONTRAST:  10 mL Omnipaque  300-administered into the gastric lumen FLUOROSCOPY TIME:  Fluoroscopic dose; 2.9 mGy COMPLICATIONS: None immediate. PROCEDURE: Informed written consent was obtained from the patient and/or patient's representative after a discussion of the risks, benefits and alternatives to treatment. Questions regarding the procedure were encouraged and answered. A timeout was performed prior to the initiation of the procedure. The abdomen and external portion of the existing jejunostomy tube was prepped and draped in the usual sterile fashion, and a sterile drape was applied covering the operative field. Maximum barrier sterile technique with sterile gowns and gloves were used for the procedure. A timeout was performed prior to the initiation of the procedure. The existing jejunostomy tube was injected with a small amount of contrast confirming appropriate positioning within the small bowel. The tube was cannulated with a stiff Glidewire which was coiled within the distal small bowel. Over the stiff guide wire, the existing tube was exchanged for a new 18 Fr balloon inflatable jejunostomy tube. The balloon was inflated with saline and dilute contrast and pulled against the anterior inner lumen of the stomach and the external disc was cinched. Contrast was injected and a post procedural spot fluoroscopic image was obtained confirming appropriate positioning and functionality of the new jejunostomy tube. A dressing was applied. The patient tolerated the  procedure well without immediate postprocedural complication. IMPRESSION: Successful fluoroscopic guided exchange of a new 18 Fr balloon-inflatable jejunostomy tube. The tube is ready for immediate use. RECOMMENDATIONS: The patient will return to [facility]iology (VIR) for routine feeding tube evaluation and exchange in 6 months. Art Largo, MD [facility] Electronically Signed   By: Art Largo M.D.   On: 08/27/2023 16:57   DG CHEST PORT 1 VIEW Result Date: 08/25/2023 CLINICAL DATA:  [id number].  Right chest tube in position. EXAM: PORTABLE CHEST 1 VIEW COMPARISON:  Portable chest yesterday at 5:34 a.m. FINDINGS: 5:06 a.m. Right chest tube  courses horizontally over the chest base than cephalad with the tip in the lateral apical area, unchanged. There is no measurable pneumothorax. Chest CT 08/19/2023 does show small loculated pneumothorax in the anterior right chest base which is not well seen. There is an esophageal stent within the gastric pull-through with air distension of the distal pull-through in the medial right chest base. There are small pleural effusions and overlying hazy atelectasis or consolidation the lung bases. The mid and upper lungs remain clear. Overall aeration seems unchanged. The mediastinum is stable.  The cardiac size is normal. Right IJ port catheter terminates at the superior cavoatrial junction. IMPRESSION: 1. No measurable pneumothorax. Chest CT 08/19/2023 does show small loculated pneumothorax in the anterior right chest base which is not well seen. 2. Small pleural effusions and overlying hazy atelectasis or consolidation in the lung bases. Overall aeration seems unchanged. 3. Esophageal stent within the gastric pull-through with air distension of the distal pull-through in the medial right chest base. Electronically Signed   By: Denman Fischer M.D.   On: 08/25/2023 06:55   DG CHEST PORT 1 VIEW Result  Date: 08/24/2023 CLINICAL DATA:  Status post esophagectomy and subsequent covered esophageal stent placement across the esophagogastric anastomosis. EXAM: PORTABLE CHEST 1 VIEW COMPARISON:  08/23/2023 FINDINGS: Basilar atelectasis again noted with right chest tube evident. Persistent apparent loculated pleural gas at the right base. Esophagogastric stent overlies the mediastinum as before. Right Port-A-Cath again noted. No substantial left-sided pleural effusion. Telemetry leads overlie the chest. IMPRESSION: 1. Persistent apparent loculated pleural gas at the right base with right chest tube in place. 2. Basilar atelectasis. Electronically Signed   By: Donnal Fusi M.D.   On: 08/24/2023 08:15   DG CHEST PORT 1 VIEW Result Date: 08/23/2023 CLINICAL DATA:  Fever. EXAM: PORTABLE CHEST 1 VIEW COMPARISON:  August 23, 2023 (5:45 a.m.) FINDINGS: There is stable right-sided venous Port-A-Cath and chest tube position. The heart size and mediastinal contours are within normal limits. A radiopaque esophageal stent is in place. Stable areas of bibasilar atelectasis are seen, right greater than left. A stable area of lucency is again noted within the right lung base likely consistent with a sub pulmonic pneumothorax. A small, stable right pleural effusion is noted. The visualized skeletal structures are unremarkable. IMPRESSION: 1. Stable right-sided chest tube position with a stable right sub pulmonic pneumothorax. 2. Stable bibasilar atelectasis, right greater than left. 3. A small, stable right pleural effusion. Electronically Signed   By: Virgle Grime M.D.   On: 08/23/2023 20:39   DG Chest 1 View Result Date: 08/23/2023 CLINICAL DATA:  History of esophagectomy EXAM: CHEST  1 VIEW COMPARISON:  08/22/2023 FINDINGS: Right Port-A-Cath and right chest tube remain in place, unchanged. Esophageal stent is again noted. Increasing lucency at the right lung base, likely sub pulmonic pneumothorax. Bibasilar opacities, likely  atelectasis. Suspect small layering left effusion. Heart mediastinal contours are stable. IMPRESSION: Increasing lucency at the right lung base, likely sub pulmonic pneumothorax. Bibasilar atelectasis.  Suspect small left effusion. Electronically Signed   By: Janeece Mechanic M.D.   On: 08/23/2023 10:10   DG Chest 1 View Result Date: 08/22/2023 CLINICAL DATA:  Status post esophagectomy. EXAM: CHEST  1 VIEW COMPARISON:  August 21, 2023. FINDINGS: Stable cardiomediastinal silhouette. Esophageal stent is again noted. Right internal jugular Port-A-Cath is unchanged. Right-sided chest tube is noted without pneumothorax. Bibasilar atelectasis is noted with small pleural effusions. Bony thorax is unremarkable. IMPRESSION: Bibasilar atelectasis is noted with small pleural  effusions. Electronically Signed   By: Rosalene Colon M.D.   On: 08/22/2023 08:15   DG CHEST PORT 1 VIEW Result Date: 08/21/2023 CLINICAL DATA:  Pleural effusion. EXAM: PORTABLE CHEST 1 VIEW COMPARISON:  Chest radiograph dated 08/19/2023. FINDINGS: Right-sided Port-A-Cath in similar position. Interval placement of a right chest tube as well as esophageal stent. Small bilateral pleural effusions and bibasilar atelectasis or infiltrate, right greater than left. No pneumothorax. Stable cardiac silhouette no acute osseous pathology. IMPRESSION: 1. Interval placement of a right chest tube. No pneumothorax. 2. Small bilateral pleural effusions and bibasilar atelectasis or infiltrate, right greater than left. Electronically Signed   By: Angus Bark M.D.   On: 08/21/2023 10:54   DG C-Arm 1-60 Min-No Report Result Date: 08/20/2023 Fluoroscopy was utilized by the requesting physician.  No radiographic interpretation.   CT Angio Chest PE W and/or Wo Contrast Result Date: 08/20/2023 CLINICAL DATA:  Sudden onset right-sided chest pain after eating dinner. Discharged earlier today after robotic assisted esophagectomy on 07/28/2023. Postop abdominal pain, right  upper quadrant pain. EXAM: CT ANGIOGRAPHY CHEST WITH CONTRAST CT CHEST, ABDOMEN, AND PELVIS WITH CONTRAST TECHNIQUE: Multidetector CT imaging of the chest was performed using the standard protocol during bolus administration of intravenous contrast. Multiplanar CT image reconstructions and MIPs were obtained to evaluate the vascular anatomy. Multidetector CT imaging of the chest, abdomen and pelvis was performed following the standard protocol during bolus administration of intravenous contrast. RADIATION DOSE REDUCTION: This exam was performed according to the departmental dose-optimization program which includes automated exposure control, adjustment of the mA and/or kV according to patient size and/or use of iterative reconstruction technique. CONTRAST:  75mL OMNIPAQUE  IOHEXOL  350 MG/ML SOLN COMPARISON:  Chest radiograph 08/19/2023; barium swallow 08/13/2023; PET/CT 06/30/2023 FINDINGS: CT CHEST FINDINGS Cardiovascular: Normal heart size. No pericardial effusion. Mild aortic atherosclerotic calcification. No pulmonary embolism. No aortic dissection. Right chest wall Port-A-Cath tip at the superior cavoatrial junction. Mediastinum/Nodes: Postoperative change of esophagectomy. Question disruption of the right posterior neoesophagus at the level of T5-T6 (series 5/image 80). Extraluminal contrast is present within the fluid and gas collection along the posteromedial right upper and lower lobes. Trachea is patent.  No lymphadenopathy. Lungs/Pleura: Loculated right hydropneumothorax. Extraluminal enteric contrast is present within the fluid and gas collections along the posteromedial right upper and lower lobes. The contrast tracks around the posterior right lower lobe is present in the posterolateral thick walled fluid collection extending into the minor fissure. Additional loculated fluid collection along the medial right middle lobe. Loculated fluid and gas collections along the anterolateral right upper lobe.  Gas-filled track extending from the right upper lobe inferiorly along the course of the removed chest tube. Small left pleural effusion and associated atelectasis. Musculoskeletal: No acute fracture. Review of the MIP images confirms the above findings. CT ABDOMEN PELVIS FINDINGS Hepatobiliary: Hepatic steatosis.  No acute abnormality. Pancreas: Unremarkable. Spleen: Unremarkable. Adrenals/Urinary Tract: Normal adrenal glands. No urinary calculi or hydronephrosis. Mild diffuse bladder wall thickening. Stomach/Bowel: Circumferential rectal wall thickening. Normal caliber large and small bowel. Normal appendix. Vascular/Lymphatic: Aortic atherosclerotic calcification. No lymphadenopathy. Reproductive: Enlarged prostate and denting the floor of the bladder. Other: No intra-abdominal or pelvic abscess. No free air. Jejunostomy tube with tip in the jejunum in the left upper quadrant. Musculoskeletal: No acute fracture. IMPRESSION: 1. 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 Possible site of disruption along the right posterior neo esophagus the level of T5-T6. 2. Small left pleural effusion and  associated atelectasis. 3. Circumferential rectal wall thickening, suspicious for proctitis. Neoplasm is difficult to exclude. Recommend correlation with colonoscopy when clinically appropriate. 4. Mild diffuse bladder wall thickening, possibly due to chronic bladder outlet obstruction given enlarged prostate. 5. Hepatic steatosis. 6. Aortic Atherosclerosis (ICD10-I70.0). These results were called by telephone at the time of interpretation on 08/20/2023 at 12:32 am to provider Dr. Eliseo Guiles, who verbally acknowledged these results. Electronically Signed   By: Rozell Cornet M.D.   On: 08/20/2023 00:35   CT CHEST ABDOMEN PELVIS W CONTRAST Result Date: 08/20/2023 CLINICAL DATA:  Sudden onset right-sided chest pain after eating dinner. Discharged earlier today after  robotic assisted esophagectomy on 07/28/2023. Postop abdominal pain, right upper quadrant pain. EXAM: CT ANGIOGRAPHY CHEST WITH CONTRAST CT CHEST, ABDOMEN, AND PELVIS WITH CONTRAST TECHNIQUE: Multidetector CT imaging of the chest was performed using the standard protocol during bolus administration of intravenous contrast. Multiplanar CT image reconstructions and MIPs were obtained to evaluate the vascular anatomy. Multidetector CT imaging of the chest, abdomen and pelvis was performed following the standard protocol during bolus administration of intravenous contrast. RADIATION DOSE REDUCTION: This exam was performed according to the departmental dose-optimization program which includes automated exposure control, adjustment of the mA and/or kV according to patient size and/or use of iterative reconstruction technique. CONTRAST:  75mL OMNIPAQUE  IOHEXOL  350 MG/ML SOLN COMPARISON:  Chest radiograph 08/19/2023; barium swallow 08/13/2023; PET/CT 06/30/2023 FINDINGS: CT CHEST FINDINGS Cardiovascular: Normal heart size. No pericardial effusion. Mild aortic atherosclerotic calcification. No pulmonary embolism. No aortic dissection. Right chest wall Port-A-Cath tip at the superior cavoatrial junction. Mediastinum/Nodes: Postoperative change of esophagectomy. Question disruption of the right posterior neoesophagus at the level of T5-T6 (series 5/image 80). Extraluminal contrast is present within the fluid and gas collection along the posteromedial right upper and lower lobes. Trachea is patent.  No lymphadenopathy. Lungs/Pleura: Loculated right hydropneumothorax. Extraluminal enteric contrast is present within the fluid and gas collections along the posteromedial right upper and lower lobes. The contrast tracks around the posterior right lower lobe is present in the posterolateral thick walled fluid collection extending into the minor fissure. Additional loculated fluid collection along the medial right middle lobe.  Loculated fluid and gas collections along the anterolateral right upper lobe. Gas-filled track extending from the right upper lobe inferiorly along the course of the removed chest tube. Small left pleural effusion and associated atelectasis. Musculoskeletal: No acute fracture. Review of the MIP images confirms the above findings. CT ABDOMEN PELVIS FINDINGS Hepatobiliary: Hepatic steatosis.  No acute abnormality. Pancreas: Unremarkable. Spleen: Unremarkable. Adrenals/Urinary Tract: Normal adrenal glands. No urinary calculi or hydronephrosis. Mild diffuse bladder wall thickening. Stomach/Bowel: Circumferential rectal wall thickening. Normal caliber large and small bowel. Normal appendix. Vascular/Lymphatic: Aortic atherosclerotic calcification. No lymphadenopathy. Reproductive: Enlarged prostate and denting the floor of the bladder. Other: No intra-abdominal or pelvic abscess. No free air. Jejunostomy tube with tip in the jejunum in the left upper quadrant. Musculoskeletal: No acute fracture. IMPRESSION: 1. 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 Possible site of disruption along the right posterior neo esophagus the level of T5-T6. 2. Small left pleural effusion and associated atelectasis. 3. Circumferential rectal wall thickening, suspicious for proctitis. Neoplasm is difficult to exclude. Recommend correlation with colonoscopy when clinically appropriate. 4. Mild diffuse bladder wall thickening, possibly due to chronic bladder outlet obstruction given enlarged prostate. 5. Hepatic steatosis. 6. Aortic Atherosclerosis (ICD10-I70.0). These results were called by telephone at the time of interpretation on 08/20/2023 at 12:32  am to provider Dr. Eliseo Guiles, who verbally acknowledged these results. Electronically Signed   By: Rozell Cornet M.D.   On: 08/20/2023 00:35   DG Chest 2 View Result Date: 08/19/2023 CLINICAL DATA:  cp EXAM: CHEST - 2 VIEW  COMPARISON:  Chest x-ray 08/13/2023, CT chest 03/14/2023 mama esophagram 08/13/2023 FINDINGS: Right chest wall Port-A-Cath with tip at the superior caval junction. The heart and mediastinal contours are within normal limits. Right lower lung zone airspace opacity. No pulmonary edema. At least small right pleural effusion. Trace left pleural effusion. No pneumothorax. No acute osseous abnormality. Right paramediastinal density suggestive of known thickened esophageal wall. Interval decrease in associated calcifications versus retained PO contrast. IMPRESSION: 1. At least small right pleural effusion. Underlying atelectasis versus infection. 2. Trace left pleural effusion. 3. Right paramediastinal density suggestive of known thickened esophageal wall. Interval decrease in associated calcifications versus retained PO contrast. Electronically Signed   By: Morgane  Naveau M.D.   On: 08/19/2023 20:57   DG CHEST PORT 1 VIEW Result Date: 08/13/2023 CLINICAL DATA:  Right-sided pleural effusion. EXAM: PORTABLE CHEST 1 VIEW COMPARISON:  Chest radiograph dated 08/08/2023. FINDINGS: Stable right chest tube. Right-sided Port-A-Cath in similar position. Small right pleural effusion and right lung base atelectasis or infiltrate. Minimal right apical pneumothorax measuring 2 mm in thickness to the pleural surface. Significant improvement in the aeration of the left lung base. Stable cardiac silhouette. High attenuating matter over the mediastinum represents contrast. This may be within the mediastinum or represent aspiration versus skin contamination. Clinical correlation is recommended. PA and lateral views of the chest may provide better evaluation. No acute osseous pathology. IMPRESSION: 1. Small right pleural effusion and right lung base atelectasis or infiltrate. 2. Minimal right apical pneumothorax. 3. Significant improvement in the aeration of the left lung base. 4. Contrast over the mediastinum as above. Clinical  correlation is recommended. Electronically Signed   By: Angus Bark M.D.   On: 08/13/2023 11:38   DG ESOPHAGUS W SINGLE CM (SOL OR THIN BA) Result Date: 08/13/2023 CLINICAL DATA:  Patient is a 68 y/o male who is 16 days post op from a Iver Lewis esophagectomy. Patient's diet was advanced, but there is now concern for a delayed anastomotic leak due to murky drain output. Patient presents for an esophagram to evaluate for an anastomotic leak. EXAM: ESOPHAGUS/BARIUM SWALLOW TECHNIQUE: Single contrast examination was performed using water  soluble contrast. This exam was performed by Thomos Flies PA-C and was supervised and interpreted by Dr. Reta Cassis. FLUOROSCOPY: Radiation Exposure Index (as provided by the fluoroscopic device): 4.3 mGy Kerma COMPARISON:  Swallow study 08/12/23, 08/06/23, and esophagram 08/05/23. FINDINGS: No aspiration. Expected postsurgical changes after Iver Lewis esophagectomy. No evidence of contrast extravasation to suggest anastomotic leak. Residual contrast within the central airways from previous aspiration. IMPRESSION: 1. No evidence of anastomotic leak. Electronically Signed   By: Aleta Anda M.D.   On: 08/13/2023 11:34   DG Swallowing Func-Speech Pathology Result Date: 08/12/2023 Table formatting from the original result was not included. Modified Barium Swallow Study Patient Details Name: HAVOC SANLUIS MRN: [id number] Date of Birth: 1955/07/17 Today's Date: 08/12/2023 HPI/PMH: HPI: Patient is a 68 y.o. male with PMH: HTN, DM. He presented to [facility] on 07/28/23 for esophagectomy, pyloromyotomy, jejunostomy placed and gentle tube feedings started on 3/11. Patient with worsening renal function, low grade fevers and transferred to ICU. On 3/15 he had sudden onset of stridor and required intubation; marked false cord swelling noted. Bronchoscopy performed on 3/15  which reported thick mucus in trachea partially occluding left mainstem, suspicion or aspiration and mucus plugging  with recurrent false vocal cord irritation leading to swelling, stridor, respiratory failure. He was extubated on 3/17. On 3/18, barium esophagram was completed to evaluate for an anastomotic leak and aspiration of barium liquid was reported, prompting SLP swallow evaluation order. MBS completed with D2/NTL recommended with chin tuck trials with thin liquids.  MBS on 3/19 recommending Dys 2, nectar thick liquids and thin liquids in between meals with chin tuck strategy. Repeat MBS ordered to determine patient's readiness to advance with solids. Clinical Impression: Clinical Impression: Patient presents with significant improvement in swallow function as compared to previous MBS on 08/06/23. During today's MBS, barium consistencies of thin liquid, puree solid,44mm barium tablet and regular texture solids were assessed. Patient with improved anterior hyoid excursion, laryngeal elevation and laryngeal vestibular closure. Thin liquids tested with and without chin tuck and patient did not exhibit any instances of penetration or aspiration with head in neutral position or in chin tuck position. Pharyngeal residuals in vallecular sinus and pyriform sinus were decreased in amount as compared to prior MBS and subsequent swallows helped clear residuals from mild to trace. 13mm barium tablet taken with puree solids transited through PES and upper esophagus without delay. Tablet transit did slow down in thoracic portion of esophagus but fully transited with sips of liquids and spoon of puree. SLP is recommending to advance diet to regular texture solids, thin liquids. Patient educated on strategies to swallow an extra time to clear residuals and to alternate sips and bites. Factors that may increase risk of adverse event in presence of aspiration Roderick Civatte & Jessy Morocco 2021): No data recorded Recommendations/Plan: Swallowing Evaluation Recommendations Swallowing Evaluation Recommendations Recommendations: PO diet PO Diet  Recommendation: Regular; Thin liquids (Level 0) Liquid Administration via: Cup; Straw Medication Administration: Whole meds with puree Supervision: Patient able to self-feed Swallowing strategies  : Slow rate; Small bites/sips; Follow solids with liquids Postural changes: Position pt fully upright for meals Oral care recommendations: Oral care BID (2x/day); Pt independent with oral care Treatment Plan Treatment Plan Treatment recommendations: Therapy as outlined in treatment plan below Follow-up recommendations: No SLP follow up Functional status assessment: Patient has had a recent decline in their functional status and demonstrates the ability to make significant improvements in function in a reasonable and predictable amount of time. Treatment frequency: Min 1x/week Treatment duration: 1 week Interventions: Aspiration precaution training; Diet toleration management by SLP; Patient/family education Recommendations Recommendations for follow up therapy are one component of a multi-disciplinary discharge planning process, led by the attending physician.  Recommendations may be updated based on patient status, additional functional criteria and insurance authorization. Assessment: Orofacial Exam: Orofacial Exam Oral Cavity: Oral Hygiene: WFL Oral Cavity - Dentition: Adequate natural dentition Anatomy: Anatomy: WFL Boluses Administered: Boluses Administered Boluses Administered: Thin liquids (Level 0); Puree; Solid  Oral Impairment Domain: Oral Impairment Domain Lip Closure: No labial escape Tongue control during bolus hold: Not tested Bolus preparation/mastication: Timely and efficient chewing and mashing Bolus transport/lingual motion: Brisk tongue motion Oral residue: Trace residue lining oral structures Location of oral residue : Tongue Initiation of pharyngeal swallow : Valleculae  Pharyngeal Impairment Domain: Pharyngeal Impairment Domain Soft palate elevation: No bolus between soft palate (SP)/pharyngeal wall  (PW) Laryngeal elevation: Complete superior movement of thyroid cartilage with complete approximation of arytenoids to epiglottic petiole Anterior hyoid excursion: Complete anterior movement Epiglottic movement: Complete inversion Laryngeal vestibule closure: Complete, no air/contrast in laryngeal vestibule  Pharyngeal stripping wave : Present - complete Pharyngeal contraction (A/P view only): N/A Pharyngoesophageal segment opening: Complete distension and complete duration, no obstruction of flow Tongue base retraction: No contrast between tongue base and posterior pharyngeal wall (PPW) Pharyngeal residue: Trace residue within or on pharyngeal structures Location of pharyngeal residue: Valleculae; Pyriform sinuses  Esophageal Impairment Domain: Esophageal Impairment Domain Esophageal clearance upright position: Complete clearance, esophageal coating Pill: Pill Consistency administered: Puree Puree: WFL Penetration/Aspiration Scale Score: Penetration/Aspiration Scale Score 1.  Material does not enter airway: Thin liquids (Level 0); Puree; Solid Compensatory Strategies: Compensatory Strategies Compensatory strategies: No   General Information: No data recorded Diet Prior to this Study: Dysphagia 2 (finely chopped); Thin liquids (Level 0)   No data recorded  Respiratory Status: WFL   Supplemental O2: None (Room air)   No data recorded Behavior/Cognition: Alert; Cooperative; Pleasant mood Self-Feeding Abilities: Able to self-feed Baseline vocal quality/speech: Normal Volitional Cough: Able to elicit Volitional Swallow: Able to elicit Exam Limitations: No limitations Goal Planning: Prognosis for improved oropharyngeal function: Good No data recorded No data recorded No data recorded Consulted and agree with results and recommendations: Patient Pain: Pain Assessment Pain Assessment: No/denies pain Pain Score: 0 Faces Pain Scale: 4 Pain Location: R shoulder with movement Pain Descriptors / Indicators: Sore Pain  Intervention(s): Monitored during session; Limited activity within patient's tolerance; Premedicated before session; RN gave pain meds during session End of Session: Start Time:SLP Start Time (ACUTE ONLY): 1540 Stop Time: SLP Stop Time (ACUTE ONLY): 1555 Time Calculation:SLP Time Calculation (min) (ACUTE ONLY): 15 min Charges: SLP Evaluations $ SLP Speech Visit: 1 Visit SLP Evaluations $MBS Swallow: 1 Procedure $Swallowing Treatment: 1 Procedure SLP visit diagnosis: SLP Visit Diagnosis: Dysphagia, pharyngoesophageal phase (R13.14) Past Medical History: Past Medical History: Diagnosis Date  Arthritis   Cancer (HCC)   Chronic kidney disease   Diabetes mellitus (HCC)   Dyslipidemia   Hypertension  Past Surgical History: Past Surgical History: Procedure Laterality Date  ESOPHAGOGASTRODUODENOSCOPY N/A 07/28/2023  Procedure: EGD (ESOPHAGOGASTRODUODENOSCOPY);  Surgeon: Hilarie Lovely, MD;  Location: [facility] OR;  Service: Thoracic;  Laterality: N/A;  ESOPHAGOGASTRODUODENOSCOPY (EGD) WITH PROPOFOL  N/A 04/04/2023  Procedure: ESOPHAGOGASTRODUODENOSCOPY (EGD) WITH PROPOFOL ;  Surgeon: Alvis Jourdain, MD;  Location: [facility];  Service: Gastroenterology;  Laterality: N/A;  IR IMAGING GUIDED PORT INSERTION  04/09/2023  UPPER ESOPHAGEAL ENDOSCOPIC ULTRASOUND (EUS) N/A 04/04/2023  Procedure: UPPER ESOPHAGEAL ENDOSCOPIC ULTRASOUND (EUS);  Surgeon: Alvis Jourdain, MD;  Location: [facility];  Service: Gastroenterology;  Laterality: N/A; Jacqualine Mater, MA, CCC-SLP Speech Therapy    Labs:  CBC: Recent Labs    09/02/23 1220 09/03/23 0455 09/03/23 1605 09/04/23 0332 09/05/23 0255  WBC 6.8 6.1  --  6.7 5.5  HGB 7.1* 6.8* 8.2* 8.1* 7.9*  HCT 22.3* 21.4* 25.4* 24.9* 24.4*  PLT 310 298  --  305 327    COAGS: Recent Labs    07/24/23 1145 08/19/23 2018 08/20/23 0937  INR 0.9 1.2 1.2  APTT 28 30  --     BMP: Recent Labs    09/03/23 0455 09/04/23 0332 09/05/23 0255 09/08/23 0500  NA 137 138 137 137  K 4.0  4.3 4.3 3.6  CL 106 105 103 102  CO2 25 26 26 27   GLUCOSE 133* 98 175* 140*  BUN 26* 25* 25* 23  CALCIUM  6.6* 7.2* 7.5* 7.4*  CREATININE 1.44* 1.44* 1.43* 1.40*  GFRNONAA 54* 54* 54* 55*    LIVER FUNCTION TESTS: Recent Labs    08/24/23 0236 08/27/23  0252 08/30/23 0440 09/01/23 0436  BILITOT 0.3 0.6 0.4 0.5  AST 32 23 24 29   ALT 28 20 18 20   ALKPHOS 254* 209* 236* 228*  PROT 5.3* 5.5* 5.1* 5.0*  ALBUMIN  1.7* 1.7* 1.5* <1.5*     Assessment and Plan:  68 y.o. male inpatient. 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  Patient admitted for concern for an esophageal leak. VATS / EGD on 4.2.25. Stent placement performed on 4.2.25 during EGD shows no perforation or fistula. Hospital stay complicated by intermittent large volume (310 ml overnight) white output thought to be chyle in nature. Cholesterol from pleural fluid from 4.17.25 16. Triglycerides  charted as (serum) from 4.14.25 73 and 3.16.25 184. Additional labs currently pending from OSH facility. triglycerides say blood 73. 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   PLAN: IR Image Guided Chyle Leak Embolization.    Team instructed to: Keep Patient to be NPO after midnight on 4.24.25 Hold prophylactic anticoagulation 24 hours prior to scheduled procedure   The Risks and benefits of embolization were discussed with the patient including, but not limited to bleeding, infection, vascular injury, post operative pain, or contrast induced renal failure.  This procedure involves the use of X-rays and because of the nature of the planned procedure, it is possible that we will have prolonged use of X-ray fluoroscopy.  Potential radiation risks to you include (but are not limited  to) the following: - A slightly elevated risk for cancer several years later in life. This risk is typically less than 0.5% percent. This risk is low in comparison to the normal incidence of human cancer, which is 33% for women and 50% for men according to the [employer]. - Radiation induced injury can include skin redness, resembling a rash, tissue breakdown / ulcers and hair loss (which can be temporary or permanent).   The likelihood of either of these occurring depends on the difficulty of the procedure and whether you are sensitive to radiation due to previous procedures, disease, or genetic conditions.   IF your procedure requires a prolonged use of radiation, you will be notified and given written instructions for further action.  It is your responsibility to monitor the irradiated area for the 2 weeks following the procedure and to notify your physician if you are concerned that you have suffered a radiation induced injury.    All of the patient's and his wife's questions were answered, patient and wife are both agreeable to proceed.  Consent signed and in chart.   Thank you for this interesting consult.  I greatly enjoyed meeting Seth Burch and look forward to participating in their care.  A copy of this report was sent to the requesting provider on this date.  Electronically Signed: Marceil Sensor, NP 09/09/2023, 11:30 AM   I spent a total of 40 Minutes    in face to face in clinical consultation, greater than 50% of which was counseling/coordinating care for chylothorax duct embolization

## 2023-09-09 NOTE — Progress Notes (Signed)
      [address]       [place] [address]             [phone]      20 Days Post-Op Procedure(s) (LRB): VIDEO ASSISTED THORACOSCOPY (VATS)/DECORTICATION (Right) EGD (ESOPHAGOGASTRODUODENOSCOPY) (N/A)  Subjective:  Patient without new complaints.  Had one episode of diarrhea yesterday.  Working with mobility.  No N/V  Objective: Vital signs in last 24 hours: Temp:  [98.2 F (36.8 C)-99.2 F (37.3 C)] 98.9 F (37.2 C) (04/22 0728) Pulse Rate:  [82-92] 92 (04/22 0728) Cardiac Rhythm: Normal sinus rhythm (04/21 2000) Resp:  [12-26] 19 (04/22 0728) BP: (114-131)/(67-86) 131/71 (04/22 0728) Weight:  [76.5 kg] 76.5 kg (04/22 0648)  Intake/Output from previous day: 04/21 0701 - 04/22 0700 In: 1363.8 [NG/GT:1333.8] Out: 1110 [Urine:800; Chest Tube:310]  General appearance: alert, cooperative, and no distress Heart: regular rate and rhythm Lungs: clear to auscultation bilaterally Abdomen: soft, non-tender; bowel sounds normal; no masses,  no organomegaly Extremities: extremities normal, atraumatic, no cyanosis or edema Wound: pravena on right chest incision, J Tube drainage much improved  Lab Results: No results for input(s): "WBC", "HGB", "HCT", "PLT" in the last 72 hours. BMET:  Recent Labs    09/08/23 0500  NA 137  K 3.6  CL 102  CO2 27  GLUCOSE 140*  BUN 23  CREATININE 1.40*  CALCIUM  7.4*    PT/INR: No results for input(s): "LABPROT", "INR" in the last 72 hours. 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 IR consultation was requested for possible lymphscintigraphy  Renal- CKD Stage 3, creatinine has been stable GI- diarrhea continues, fiber supplementation added yesterday, tube feedings on 55 ml/hr as discussed with Dr. Deloise Ferries will not advance for now.. on dysphagia 1 diet... no plans to advance at this time...nutrition recommending TPN as patient is not meeting nutritional requirements DM- sugars remain controlled Dispo- patient stable, no acute issues, continue current plan.. will discuss TPN with Dr. Deloise Ferries, await IR recommendations   LOS: 21 days    [name], PA-C 09/09/2023

## 2023-09-10 ENCOUNTER — Inpatient Hospital Stay: Admitting: Dietician

## 2023-09-10 ENCOUNTER — Other Ambulatory Visit: Payer: Self-pay

## 2023-09-10 LAB — TRIGLYCERIDES, BODY FLUIDS

## 2023-09-10 LAB — GLUCOSE, CAPILLARY
Glucose-Capillary: 121 mg/dL — ABNORMAL HIGH (ref 70–99)
Glucose-Capillary: 122 mg/dL — ABNORMAL HIGH (ref 70–99)
Glucose-Capillary: 123 mg/dL — ABNORMAL HIGH (ref 70–99)
Glucose-Capillary: 130 mg/dL — ABNORMAL HIGH (ref 70–99)
Glucose-Capillary: 143 mg/dL — ABNORMAL HIGH (ref 70–99)
Glucose-Capillary: 166 mg/dL — ABNORMAL HIGH (ref 70–99)
Glucose-Capillary: 91 mg/dL (ref 70–99)

## 2023-09-10 LAB — AEROBIC/ANAEROBIC CULTURE W GRAM STAIN (SURGICAL/DEEP WOUND)

## 2023-09-10 NOTE — Progress Notes (Signed)
 Occupational Therapy Treatment Patient Details Name: Seth Burch MRN: [id number] DOB: August 15, 1955 Today's Date: 09/10/2023   History of present illness Pt is a 68 y/o male presenting 4/1 due to acute onset of R chest pain after eating. Pt noted with AKI. 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 4/2 R VATS, EGD, esophageal stent placement, and chest tube placement. 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 PMHx: HTN, DM   OT comments  Pt continues to need slight assistance for bed mobility. He stands and ambulates with RW and supervision, primarily for lines, to bathroom. Pt manages pericare without assist and can complete grooming at sink with supervision. Pt able to perform figure 4 to don socks/wash feet. Pt reports having a walk in shower with built in seat at home should he need it. Pt voicing feelings of being overwhelmed with frequent changes to medical plan, difficulty eating and responses of family members to pt's prolonged illness. Assured pt that his feelings are valid as his case is complex and medical course is not straightforward. Pt stating he is grateful for all team members efforts at end of session.  Will continue to follow.       If plan is discharge home, recommend the following:  Assistance with cooking/housework;Assist for transportation;Help with stairs or ramp for entrance   Equipment Recommendations  None recommended by OT    Recommendations for Other Services      Precautions / Restrictions Precautions Precautions: Fall;Other (comment) Precaution/Restrictions Comments: jejunostomy, R chest tube, wound vac Restrictions Weight Bearing Restrictions Per Provider Order: No       Mobility Bed Mobility Overal bed mobility: Needs Assistance Bed  Mobility: Rolling, Sidelying to Sit, Sit to Supine Rolling: Modified independent (Device/Increase time) Sidelying to sit: Min assist, HOB elevated   Sit to supine: Contact guard assist   General bed mobility comments: slight assist to raise trunk    Transfers Overall transfer level: Needs assistance Equipment used: Rolling walker (2 wheels) Transfers: Sit to/from Stand Sit to Stand: Supervision           General transfer comment: supervision for safety/lines     Balance   Sitting-balance support: No upper extremity supported, Feet supported Sitting balance-Leahy Scale: Good       Standing balance-Leahy Scale: Fair Standing balance comment: able to stand statically with no AD, RW for gait                           ADL either performed or assessed with clinical judgement   ADL Overall ADL's : Needs assistance/impaired     Grooming: Standing;Contact guard assist;Wash/dry hands           Upper Body Dressing : Set up;Sitting   Lower Body Dressing: Set up;Sitting/lateral leans   Toilet Transfer: Supervision/safety;Rolling walker (2 wheels);Regular Toilet;Ambulation   Toileting- Clothing Manipulation and Hygiene: Supervision/safety;Sitting/lateral lean       Functional mobility during ADLs: Supervision/safety;Rolling walker (2 wheels)      Extremity/Trunk Assessment              Vision       Perception     Praxis     Communication Communication Communication: No apparent difficulties   Cognition Arousal: Alert Behavior During Therapy: WFL for tasks assessed/performed Cognition: No apparent impairments  Following commands: Intact        Cueing   Cueing Techniques: Verbal cues  Exercises      Shoulder Instructions       General Comments      Pertinent Vitals/ Pain       Pain Assessment Pain Assessment: Faces Faces Pain Scale: Hurts a little bit Pain Location: abdomen Pain  Descriptors / Indicators: Discomfort Pain Intervention(s): Monitored during session  Home Living                                          Prior Functioning/Environment              Frequency  Min 2X/week        Progress Toward Goals  OT Goals(current goals can now be found in the care plan section)  Progress towards OT goals: Progressing toward goals  Acute Rehab OT Goals OT Goal Formulation: With patient Time For Goal Achievement: 09/16/23 Potential to Achieve Goals: Good  Plan      Co-evaluation                 AM-PAC OT "6 Clicks" Daily Activity     Outcome Measure   Help from another person eating meals?: None Help from another person taking care of personal grooming?: A Little Help from another person toileting, which includes using toliet, bedpan, or urinal?: A Little Help from another person bathing (including washing, rinsing, drying)?: A Little Help from another person to put on and taking off regular upper body clothing?: None Help from another person to put on and taking off regular lower body clothing?: A Little 6 Click Score: 20    End of Session Equipment Utilized During Treatment: Rolling walker (2 wheels)  OT Visit Diagnosis: Muscle weakness (generalized) (M62.81);Unsteadiness on feet (R26.81)   Activity Tolerance Patient tolerated treatment well   Patient Left in bed;with call bell/phone within reach   Nurse Communication          Time: 1445-1530 OT Time Calculation (min): 45 min  Charges: OT General Charges $OT Visit: 1 Visit OT Treatments $Self Care/Home Management : 38-52 mins  Avanell Leigh, OTR/L Acute Rehabilitation Services Office: [phone]   Jonette Nestle 09/10/2023, 4:09 PM

## 2023-09-10 NOTE — Progress Notes (Signed)
 Nutrition Brief Note  Continues at 42ml/hr. Not advancing today, per surgery. Spoke with PA-C via secure chat who reports MD preference to continue at current TF rate and hold off on supplemental TPN today. Scheduled for chyle leak embolization tomorrow. Will re-assess thereafter.   Spoke with patient at bedside along with OT today. He verbalizes feeling overwhelmed with current state of his health. Frustration with family regarding his care and impatience to get him home. He does not want to return home too early and return for readmission. Validated feelings. Patient tearful at times.    Continues on Florastor and Nutrisource Fiber to aid in reducing stool frequency and bulking stool. He continues to endorse pressure in lower abdomen while tube feedings infusing. Reports this subsides when tube feedings on hold.    Estimated Nutritional Needs:  Kcal:  2200-2400kcal Protein:  115-130g Fluid:  >=2L/day    INTERVENTION:  TF infusing via J-tube: Vivonex at 55ml/hr (1320ml per day) 60ml ProSource TF20 BID   Providing 1480 kcal (67% minimum estimated needs), 106g protein (92% of minimum estimated needs) and per day, 15g fat   Continue advancing TF back to goal as tolerated, per MD: Goal TF regimen- Vivonex at 70ml/hr (1680 ml per day) ProSource TF20 60ml BID   TF at goal provides- 1840 kcal (84% estimated needs), 124g protein (108% estimated needs), 1424 ml free water  daily, and 19g fat    Recommend re-initiating TPN tomorrow if unable to further advance TF to goal rate; TPN to provide calories and protein but no fat with limitations r/t chylothorax and minimal fat being provided via TF - Discussed with CTS PA, plans to discussed with MD   Continue nutrisource fiber BID to provide 3g soluble fiber per serving to aid in bulking of stool   Continue Florastor for gut health and normalization of bowels     NUTRITION DIAGNOSIS:  Severe Malnutrition related to chronic illness  (esophageal cancer s/p chemo, radiation and surgery) as evidenced by severe fat depletion, severe muscle depletion, percent weight loss. - remains applicable   GOAL:  Patient will meet greater than or equal to 90% of their needs - goal unmet   MONITOR:  PO intake, Diet advancement, TF tolerance   Con Decant MS, RD, LDN Registered Dietitian Clinical Nutrition RD Inpatient Contact Info in Amion

## 2023-09-10 NOTE — Progress Notes (Addendum)
 [address]       [place] [address]             [phone]      21 Days Post-Op Procedure(s) (LRB): VIDEO ASSISTED THORACOSCOPY (VATS)/DECORTICATION (Right) EGD (ESOPHAGOGASTRODUODENOSCOPY) (N/A) Subjective: Patient states he is doing okay but he has already had 2 episodes of diarrhea since 5 AM. Only had 2 episodes of diarrhea all yesterday.  Objective: Vital signs in last 24 hours: Temp:  [98.2 F (36.8 C)-99.3 F (37.4 C)] 98.7 F (37.1 C) (04/23 0408) Pulse Rate:  [88-97] 94 (04/23 0408) Cardiac Rhythm: Normal sinus rhythm (04/22 2022) Resp:  [18-23] 20 (04/23 0408) BP: (114-138)/(66-83) 114/71 (04/23 0408) SpO2:  [90 %-92 %] 90 % (04/23 0408) Weight:  [76 kg] 76 kg (04/23 0408)  Hemodynamic parameters for last 24 hours:    Intake/Output from previous day: 04/22 0701 - 04/23 0700 In: 1265.4 [NG/GT:1265.4] Out: 815 [Urine:700; Chest Tube:115] Intake/Output this shift: No intake/output data recorded.  General appearance: alert, cooperative, and no distress Neurologic: intact Heart: regular rate and rhythm, S1, S2 normal, no murmur, click, rub or gallop Lungs: slightly diminished bibasilar breath sounds Abdomen: +BS, soft, nontender Extremities: extremities normal, atraumatic, no cyanosis or edema Wound: J tube site with minimal bilious drainage and skin maceration, prevena in place over thoracotomy with suction no drainage in canister, chest tube with milky drainage   Lab Results: No results for input(s): "WBC", "HGB", "HCT", "PLT" in the last 72 hours. BMET:  Recent Labs    09/08/23 0500  NA 137  K 3.6  CL 102  CO2 27  GLUCOSE 140*  BUN 23  CREATININE 1.40*  CALCIUM  7.4*    PT/INR: No results for input(s): "LABPROT", "INR" in the last 72 hours. ABG    Component Value Date/Time   PHART 7.4 07/29/2023 0514   HCO3 25.8 08/02/2023 2006   TCO2 17 (L) 08/19/2023 2022   ACIDBASEDEF 1.0 08/02/2023 1506   O2SAT 94.5  08/02/2023 2006   CBG (last 3)  Recent Labs    09/09/23 2014 09/10/23 0008 09/10/23 0403  GLUCAP 140* 123* 121*    Assessment/Plan: S/P Procedure(s) (LRB): VIDEO ASSISTED THORACOSCOPY (VATS)/DECORTICATION (Right) EGD (ESOPHAGOGASTRODUODENOSCOPY) (N/A)  CV: Stable vital signs. BP controlled on Norvasc  and Lopressor . NSR, HR 90s.   Pulm: Chest tube output 115cc/24hrs recorded, marked the level at 1145 so about 195cc from my colleague's mark yesterday. Treating as a chylothorax, 2nd sample of pleural fluid triglycerides was still too viscous for result. On low fat TF and MCT oil. IR to take to the OR tomorrow for IR image guided chyle leak embolization. Prevena in place over thoracotomy incision with suction and no drainage in canister. Patient states this loses suction at times but does not alert, may need to change soon.  GI: 2 episodes of diarrhea since 5AM and some bleating per patient, on fiber supplement. TF at 30ml/hr, likely would not tolerate advancement. On dysphagia 1 diet but does not meet caloric goal. Dietician recommending re-initiating TPN if unable to advance TF to goal. Will discuss with Dr. Deloise Ferries.  Renal: CKD stage III, last Cr 1.4. Stable.   ID: No leukocytosis on last CBC 04/18. Tmax 99.3. Unasyn  course has been completed.   Endo: T2DM, on insulin  26U daily at home. CBGs controlled on Semglee  14U daily, novolog  3U Q4H, and SSI.   Dispo: Continue current plan, IR to take patient for chyle leak embolization tomorrow. Will discuss  with Dr. Issaac Shipper TPN as recommended by the dietician.    LOS: 22 days    Randa Burton, PA-C 09/10/2023  Agree with above IR for lymphoscintigraphy tomorrow Continue tube feeds  Carrick Rijos O Lotus Santillo

## 2023-09-10 NOTE — Progress Notes (Signed)
 Scheduled to see patient s/p esophagectomy. Chart reviewed. Patient currently hospitalized. Will plan to r/s appointment once discharged.

## 2023-09-11 ENCOUNTER — Other Ambulatory Visit: Payer: Self-pay

## 2023-09-11 ENCOUNTER — Encounter (HOSPITAL_COMMUNITY): Payer: Self-pay | Admitting: Thoracic Surgery (Cardiothoracic Vascular Surgery)

## 2023-09-11 ENCOUNTER — Inpatient Hospital Stay (HOSPITAL_COMMUNITY)

## 2023-09-11 ENCOUNTER — Inpatient Hospital Stay (HOSPITAL_COMMUNITY): Admitting: Certified Registered Nurse Anesthetist

## 2023-09-11 ENCOUNTER — Encounter (HOSPITAL_COMMUNITY)
Admission: EM | Disposition: A | Discharge status: Home Health | Payer: Self-pay | Source: Home / Self Care | Attending: Thoracic Surgery (Cardiothoracic Vascular Surgery)

## 2023-09-11 DIAGNOSIS — I129 Hypertensive chronic kidney disease with stage 1 through stage 4 chronic kidney disease, or unspecified chronic kidney disease: Secondary | ICD-10-CM

## 2023-09-11 DIAGNOSIS — I898 Other specified noninfective disorders of lymphatic vessels and lymph nodes: Secondary | ICD-10-CM

## 2023-09-11 DIAGNOSIS — N183 Chronic kidney disease, stage 3 unspecified: Secondary | ICD-10-CM

## 2023-09-11 HISTORY — PX: IR US GUIDE VASC ACCESS LEFT: IMG2389

## 2023-09-11 HISTORY — PX: IR LYMPHANGIOGRAM PEL/ABD UNI LEFT: IMG5780

## 2023-09-11 HISTORY — PX: IR EMBO ART  VEN HEMORR LYMPH EXTRAV  INC GUIDE ROADMAPPING: IMG5450

## 2023-09-11 HISTORY — PX: IR FLUORO GUIDED NEEDLE PLC ASPIRATION/INJECTION LOC: IMG2395

## 2023-09-11 HISTORY — PX: RADIOLOGY WITH ANESTHESIA: SHX6223

## 2023-09-11 HISTORY — PX: IR US GUIDANCE: IMG2393

## 2023-09-11 LAB — CBC
HCT: 37.6 % — ABNORMAL LOW (ref 39.0–52.0)
Hemoglobin: 12.1 g/dL — ABNORMAL LOW (ref 13.0–17.0)
MCH: 27.9 pg (ref 26.0–34.0)
MCHC: 32.2 g/dL (ref 30.0–36.0)
MCV: 86.6 fL (ref 80.0–100.0)
Platelets: 321 10*3/uL (ref 150–400)
RBC: 4.34 MIL/uL (ref 4.22–5.81)
RDW: 15.2 % (ref 11.5–15.5)
WBC: 7 10*3/uL (ref 4.0–10.5)
nRBC: 0 % (ref 0.0–0.2)

## 2023-09-11 LAB — GLUCOSE, CAPILLARY
Glucose-Capillary: 228 mg/dL — ABNORMAL HIGH (ref 70–99)
Glucose-Capillary: 206 mg/dL — ABNORMAL HIGH (ref 70–99)
Glucose-Capillary: 180 mg/dL — ABNORMAL HIGH (ref 70–99)
Glucose-Capillary: 102 mg/dL — ABNORMAL HIGH (ref 70–99)
Glucose-Capillary: 132 mg/dL — ABNORMAL HIGH (ref 70–99)
Glucose-Capillary: 118 mg/dL — ABNORMAL HIGH (ref 70–99)
Glucose-Capillary: 105 mg/dL — ABNORMAL HIGH (ref 70–99)
Glucose-Capillary: 110 mg/dL — ABNORMAL HIGH (ref 70–99)
Glucose-Capillary: 150 mg/dL — ABNORMAL HIGH (ref 70–99)

## 2023-09-11 LAB — PROTIME-INR
INR: 1.2 (ref 0.8–1.2)
Prothrombin Time: 15.5 s — ABNORMAL HIGH (ref 11.4–15.2)

## 2023-09-11 LAB — TYPE AND SCREEN
ABO/RH(D): B NEG
Antibody Screen: NEGATIVE

## 2023-09-11 SURGERY — RADIOLOGY WITH ANESTHESIA
Anesthesia: General

## 2023-09-11 MED ORDER — PROPOFOL 10 MG/ML IV BOLUS
INTRAVENOUS | Status: DC | PRN
  Administered 2023-09-11: 200 mg via INTRAVENOUS

## 2023-09-11 MED ORDER — OXYCODONE HCL 5 MG PO TABS: 5.0000 mg | ORAL_TABLET | Freq: Once | ORAL | Status: DC | PRN

## 2023-09-11 MED ORDER — MIDAZOLAM HCL 2 MG/2ML IJ SOLN
INTRAMUSCULAR | Status: AC
  Filled 2023-09-11: qty 2

## 2023-09-11 MED ORDER — LIDOCAINE HCL 1 % IJ SOLN
INTRAMUSCULAR | Status: AC
  Filled 2023-09-11: qty 20

## 2023-09-11 MED ORDER — SODIUM CHLORIDE 0.9 % IV SOLN: INTRAVENOUS | Status: DC

## 2023-09-11 MED ORDER — EPHEDRINE SULFATE-NACL 50-0.9 MG/10ML-% IV SOSY
PREFILLED_SYRINGE | INTRAVENOUS | Status: DC | PRN
  Administered 2023-09-11: 10 mg via INTRAVENOUS

## 2023-09-11 MED ORDER — MIDAZOLAM HCL 2 MG/2ML IJ SOLN
INTRAMUSCULAR | Status: DC | PRN
  Administered 2023-09-11: 2 mg via INTRAVENOUS

## 2023-09-11 MED ORDER — PHENYLEPHRINE HCL-NACL 20-0.9 MG/250ML-% IV SOLN
0.0000 ug/min | INTRAVENOUS | Status: DC
  Filled 2023-09-11: qty 250

## 2023-09-11 MED ORDER — ROCURONIUM BROMIDE 10 MG/ML (PF) SYRINGE
PREFILLED_SYRINGE | INTRAVENOUS | Status: DC | PRN
  Administered 2023-09-11 (×2): 10 mg via INTRAVENOUS
  Administered 2023-09-11: 70 mg via INTRAVENOUS

## 2023-09-11 MED ORDER — SODIUM CHLORIDE 0.9 % IV SOLN
INTRAVENOUS | Status: AC
  Filled 2023-09-11: qty 20

## 2023-09-11 MED ORDER — IOHEXOL 300 MG/ML  SOLN
100.0000 mL | Freq: Once | INTRAMUSCULAR | Status: AC | PRN
  Administered 2023-09-11: 10 mL

## 2023-09-11 MED ORDER — CHLORHEXIDINE GLUCONATE 0.12 % MT SOLN
OROMUCOSAL | Status: AC
  Administered 2023-09-11: 15 mL via OROMUCOSAL
  Filled 2023-09-11: qty 15

## 2023-09-11 MED ORDER — ORAL CARE MOUTH RINSE: 15.0000 mL | Freq: Once | OROMUCOSAL | Status: AC

## 2023-09-11 MED ORDER — LIDOCAINE 2% (20 MG/ML) 5 ML SYRINGE
INTRAMUSCULAR | Status: DC | PRN
  Administered 2023-09-11: 60 mg via INTRAVENOUS

## 2023-09-11 MED ORDER — AMISULPRIDE (ANTIEMETIC) 5 MG/2ML IV SOLN: 10.0000 mg | Freq: Once | INTRAVENOUS | Status: DC | PRN

## 2023-09-11 MED ORDER — PHENYLEPHRINE HCL-NACL 20-0.9 MG/250ML-% IV SOLN
INTRAVENOUS | Status: DC | PRN
Start: 2023-09-11 — End: 2023-09-11
  Administered 2023-09-11: 25 ug/min via INTRAVENOUS

## 2023-09-11 MED ORDER — PHENYLEPHRINE 80 MCG/ML (10ML) SYRINGE FOR IV PUSH (FOR BLOOD PRESSURE SUPPORT)
PREFILLED_SYRINGE | INTRAVENOUS | Status: DC | PRN
  Administered 2023-09-11: 160 ug via INTRAVENOUS
  Administered 2023-09-11: 240 ug via INTRAVENOUS

## 2023-09-11 MED ORDER — ALBUMIN HUMAN 5 % IV SOLN: INTRAVENOUS | Status: DC | PRN

## 2023-09-11 MED ORDER — DEXTROSE 5 % IV SOLN
INTRAVENOUS | Status: DC | PRN
  Administered 2023-09-11: 2 g via INTRAVENOUS

## 2023-09-11 MED ORDER — FENTANYL CITRATE (PF) 100 MCG/2ML IJ SOLN
INTRAMUSCULAR | Status: AC
  Filled 2023-09-11: qty 2

## 2023-09-11 MED ORDER — CHLORHEXIDINE GLUCONATE 0.12 % MT SOLN: 15.0000 mL | Freq: Once | OROMUCOSAL | Status: AC

## 2023-09-11 MED ORDER — SUGAMMADEX SODIUM 200 MG/2ML IV SOLN
INTRAVENOUS | Status: DC | PRN
  Administered 2023-09-11 (×2): 100 mg via INTRAVENOUS

## 2023-09-11 MED ORDER — LACTATED RINGERS IV SOLN: INTRAVENOUS | Status: DC | PRN

## 2023-09-11 MED ORDER — FENTANYL CITRATE (PF) 250 MCG/5ML IJ SOLN: INTRAMUSCULAR | Status: DC | PRN

## 2023-09-11 MED ORDER — ONDANSETRON HCL 4 MG/2ML IJ SOLN: 4.0000 mg | Freq: Once | INTRAMUSCULAR | Status: DC | PRN

## 2023-09-11 MED ORDER — OXYCODONE HCL 5 MG/5ML PO SOLN: 5.0000 mg | Freq: Once | ORAL | Status: DC | PRN

## 2023-09-11 MED ORDER — FENTANYL CITRATE (PF) 100 MCG/2ML IJ SOLN: 25.0000 ug | INTRAMUSCULAR | Status: DC | PRN

## 2023-09-11 MED ORDER — FENTANYL CITRATE (PF) 100 MCG/2ML IJ SOLN
INTRAMUSCULAR | Status: DC | PRN
  Administered 2023-09-11: 50 ug via INTRAVENOUS

## 2023-09-11 NOTE — H&P (Signed)
 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 IR consulted for lymphangiogram with possible embolization. This has been discussed with the patient by Dr. Mabel Savage who understand the goals, risks, and benefits and is agreeable to proceed.   The Risks and benefits of embolization were discussed with the patient including, but not limited to bleeding, infection, vascular injury, post operative pain, or contrast induced renal failure.  This procedure involves the use of X-rays and because of the nature of the planned procedure, it is possible that we will have prolonged use of X-ray fluoroscopy.  Potential radiation risks to you include (but are not limited to) the following: - A slightly elevated risk for cancer several years later in life. This risk is typically less than 0.5% percent. This risk is low in comparison to the normal incidence of human cancer, which is 33% for women and 50% for men according to the [employer] Society. - Radiation induced injury can include skin redness, resembling a rash, tissue breakdown / ulcers and hair loss (which can be temporary or permanent).   The likelihood of either of these occurring depends on the difficulty of the procedure and whether you are sensitive to radiation due to previous procedures, disease, or genetic conditions.   IF your procedure requires a prolonged use of radiation, you will be notified and given written instructions for further action.  It is your responsibility to monitor the irradiated area for the 2 weeks following the procedure and to notify your physician if you are concerned that you have suffered a radiation induced injury.    All of the patient's questions were answered, patient is agreeable to proceed. Consent signed and in chart.  ______________________________________________________________________   History of  Present Illness: Seth Burch is a 68 y.o. male with history of HTN,  HLD, DM, CKD, distal esophageal adenocarcinoma s/p esophagectomy on 3.10.25/ Patient presented to the ED at [facility] on 4.2.25 with right sided chest pain and purulent output from the former surgical drain exit site that was removed earlier that day.  Patient admitted for concern for an esophageal leak. VATS / EGD on 4.2.25. Stent placement performed on 4.2.25 during EGD shows no perforation or fistula. Hospital stay complicated by intermittent large volume (310 ml overnight) white output thought to be chyle in nature   Allergies and medications reviewed.   Review of Systems: A 12 point ROS discussed and pertinent positives are indicated in the HPI above.  All other systems are negative.  Review of Systems  Constitutional:  Negative for fatigue and fever.  Respiratory:  Negative for cough and shortness of breath.   Cardiovascular:  Negative for chest pain.  Gastrointestinal:  Negative for abdominal pain, nausea and vomiting.  Musculoskeletal:  Negative for back pain.  Psychiatric/Behavioral:  Negative for behavioral problems and confusion.     Vital Signs: BP 120/69 (BP Location: Left Arm)   Pulse 95   Temp 98.6 F (37 C) (Oral)   Resp 17   Ht 5\' 10"  (1.778 m)   Wt 165 lb 14.4 oz (75.3 kg)   SpO2 90%   BMI 23.80 kg/m   Physical Exam Vitals and nursing note reviewed.  Constitutional:      General: He is not in acute distress.    Appearance: He is well-developed.  Cardiovascular:     Rate and Rhythm: Normal rate and regular rhythm.  Pulmonary:  Effort: Pulmonary effort is normal.     Comments: Right sided chest tube in place with chylous-appearing fluid Abdominal:     Palpations: Abdomen is soft.     Comments: J-tube in place  Skin:    General: Skin is warm and dry.  Neurological:     General: No focal deficit present.     Mental Status: He is alert and oriented to person, place, and time.  Psychiatric:         Mood and Affect: Mood normal.        Behavior: Behavior normal.      Imaging reviewed.   Labs:  COAGS: Recent Labs    07/24/23 1145 08/19/23 2018 08/20/23 0937 09/11/23 0550  INR 0.9 1.2 1.2 1.2  APTT 28 30  --   --     BMP: Recent Labs    09/03/23 0455 09/04/23 0332 09/05/23 0255 09/08/23 0500  NA 137 138 137 137  K 4.0 4.3 4.3 3.6  CL 106 105 103 102  CO2 25 26 26 27   GLUCOSE 133* 98 175* 140*  BUN 26* 25* 25* 23  CALCIUM  6.6* 7.2* 7.5* 7.4*  CREATININE 1.44* 1.44* 1.43* 1.40*  GFRNONAA 54* 54* 54* 55*       Electronically Signed: Lillian Rein, PA 09/11/2023, 10:32 AM   I spent a total of 15 minutes in face to face in clinical consultation, greater than 50% of which was counseling/coordinating care for chylothorax.

## 2023-09-11 NOTE — Anesthesia Procedure Notes (Signed)
 Procedure Name: Intubation Date/Time: 09/11/2023 1:41 PM  Performed by: Robert Chimes, CRNAPre-anesthesia Checklist: Patient identified, Emergency Drugs available, Suction available and Patient being monitored Patient Re-evaluated:Patient Re-evaluated prior to induction Oxygen Delivery Method: Circle system utilized Preoxygenation: Pre-oxygenation with 100% oxygen Induction Type: IV induction Ventilation: Mask ventilation without difficulty Laryngoscope Size: Mac and 3 Grade View: Grade I Tube type: Oral Tube size: 7.0 mm Number of attempts: 1 Airway Equipment and Method: Stylet and Oral airway Placement Confirmation: ETT inserted through vocal cords under direct vision, positive ETCO2 and breath sounds checked- equal and bilateral Secured at: 23 cm Tube secured with: Tape Dental Injury: Teeth and Oropharynx as per pre-operative assessment

## 2023-09-11 NOTE — Progress Notes (Signed)
 PT Cancellation Note  Patient Details Name: Seth Burch MRN: [id number] DOB: November 05, 1955   Cancelled Treatment:    Reason Eval/Treat Not Completed: (P) Patient at procedure or test/unavailable, pt off unit for procedure. Will check back as schedule allows to continue with PT POC.  Beverly Buckler. PTA Acute Rehabilitation Services Office: [phone]    [name] 09/11/2023, 12:32 PM

## 2023-09-11 NOTE — Transfer of Care (Signed)
 Immediate Anesthesia Transfer of Care Note  Patient: Seth Burch  Procedure(s) Performed: RADIOLOGY WITH ANESTHESIA  Patient Location: PACU  Anesthesia Type:General  Level of Consciousness: awake, alert , and oriented  Airway & Oxygen Therapy: Patient Spontanous Breathing  Post-op Assessment: Report given to RN and Post -op Vital signs reviewed and stable  Post vital signs: Reviewed and stable  Last Vitals:  Vitals Value Taken Time  BP 138/65 09/11/23 1735  Temp    Pulse 84 09/11/23 1738  Resp 26 09/11/23 1738  SpO2 91 % 09/11/23 1738  Vitals shown include unfiled device data.  Last Pain:  Vitals:   09/11/23 1344  TempSrc:   PainSc: 0-No pain      Patients Stated Pain Goal: 0 (09/07/23 0741)  Complications: No notable events documented.

## 2023-09-11 NOTE — Anesthesia Preprocedure Evaluation (Signed)
 Anesthesia Evaluation  Patient identified by MRN, date of birth, ID band Patient awake    Reviewed: Allergy & Precautions, NPO status , Patient's Chart, lab work & pertinent test results  History of Anesthesia Complications Negative for: history of anesthetic complications  Airway Mallampati: II  TM Distance: >3 FB Neck ROM: Full    Dental no notable dental hx. (+) Teeth Intact, Dental Advisory Given   Pulmonary neg pulmonary ROS   Pulmonary exam normal breath sounds clear to auscultation       Cardiovascular hypertension, Pt. on medications and Pt. on home beta blockers (-) angina (-) Past MI Normal cardiovascular exam Rhythm:Regular Rate:Normal     Neuro/Psych negative neurological ROS     GI/Hepatic ,GERD  Medicated and Controlled,,  Endo/Other  diabetes, Insulin  Dependent    Renal/GU Renal InsufficiencyRenal diseaseLab Results      Component                Value               Date                      NA                       137                 09/08/2023                CL                       102                 09/08/2023                K                        3.6                 09/08/2023                CO2                      27                  09/08/2023                BUN                      23                  09/08/2023                CREATININE               1.40 (H)            09/08/2023                GFRNONAA                 55 (L)              09/08/2023                CALCIUM                   7.4 (L)  09/08/2023                PHOS                     3.0                 09/04/2023                ALBUMIN                   <1.5 (L)            09/01/2023                GLUCOSE                  140 (H)             09/08/2023                Musculoskeletal  (+) Arthritis ,    Abdominal   Peds  Hematology Lab Results      Component                Value               Date                       WBC                      7.0                 09/11/2023                HGB                      12.1 (L)            09/11/2023                HCT                      37.6 (L)            09/11/2023                MCV                      86.6                09/11/2023                PLT                      321                 09/11/2023              Anesthesia Other Findings   Reproductive/Obstetrics                             Anesthesia Physical Anesthesia Plan  ASA: 3  Anesthesia Plan: General   Post-op Pain Management:    Induction: Intravenous  PONV Risk Score and Plan: Treatment may vary due to age or medical condition, Midazolam  and Ondansetron   Airway Management Planned: Oral ETT  Additional Equipment: None  Intra-op Plan:   Post-operative Plan: Extubation in OR  Informed Consent:  I have reviewed the patients History and Physical, chart, labs and discussed the procedure including the risks, benefits and alternatives for the proposed anesthesia with the patient or authorized representative who has indicated his/her understanding and acceptance.     Dental advisory given  Plan Discussed with:   Anesthesia Plan Comments:        Anesthesia Quick Evaluation

## 2023-09-11 NOTE — Progress Notes (Addendum)
 Pt's Tube Feed was not stopped until 751am this morning. Notified Dr. Lasalle Pointer with anesthesia, states pt would need to wait 8 hours. Called IR, spoke with Freida Jes RN to relay message.  Addendum: Dr. Lasalle Pointer spoke with Dr. Mabel Savage, will proceed at noon

## 2023-09-11 NOTE — Sedation Documentation (Signed)
 Care per anesthesia at this time for IR procedure. See CRNA documentation. Kennieth Peaches, CRNA at bedside at this time.

## 2023-09-11 NOTE — Procedures (Signed)
 Interventional Radiology Procedure Note  Procedure:   US  guided left and right inguinal node needle access, for lipiodol lymphangiogram.  Fluoro guided fenestration of the thoracic duct.  US  guided left basilic vein access Retrograde cannulation of the thoracic duct. Coil and glue embolization of the thoracic duct with NBCA.   EBL: None Anesthesia: GETA  Complications: None  Recommendations:  - to PACU - routine wound care  - follow chest tube output, anticipate few days of ongoing output as the duct heals/seals - VIR to follow - Do not submerge for 7 days    Signed,  Marciano Settles. Mabel Savage, DO

## 2023-09-12 ENCOUNTER — Encounter (HOSPITAL_COMMUNITY): Payer: Self-pay | Admitting: Interventional Radiology

## 2023-09-12 LAB — GLUCOSE, CAPILLARY
Glucose-Capillary: 166 mg/dL — ABNORMAL HIGH (ref 70–99)
Glucose-Capillary: 159 mg/dL — ABNORMAL HIGH (ref 70–99)
Glucose-Capillary: 283 mg/dL — ABNORMAL HIGH (ref 70–99)
Glucose-Capillary: 163 mg/dL — ABNORMAL HIGH (ref 70–99)
Glucose-Capillary: 109 mg/dL — ABNORMAL HIGH (ref 70–99)
Glucose-Capillary: 179 mg/dL — ABNORMAL HIGH (ref 70–99)

## 2023-09-12 MED ORDER — VIVONEX RTF PO LIQD
1000.0000 mL | ORAL | Status: DC
  Administered 2023-09-12 – 2023-09-13 (×2): 1000 mL via ORAL
  Filled 2023-09-12 (×4): qty 1000

## 2023-09-12 NOTE — Plan of Care (Signed)
  Problem: Fluid Volume: Goal: Ability to maintain a balanced intake and output will improve Outcome: Progressing   Problem: Metabolic: Goal: Ability to maintain appropriate glucose levels will improve Outcome: Progressing   Problem: Nutritional: Goal: Maintenance of adequate nutrition will improve Outcome: Progressing   Problem: Skin Integrity: Goal: Risk for impaired skin integrity will decrease Outcome: Progressing   Problem: Activity: Goal: Risk for activity intolerance will decrease Outcome: Progressing

## 2023-09-12 NOTE — Plan of Care (Signed)
  Problem: Fluid Volume: Goal: Ability to maintain a balanced intake and output will improve 09/12/2023 0408 by Rosezella Conte, RN Outcome: Progressing 09/12/2023 0407 by Rosezella Conte, RN Outcome: Progressing   Problem: Metabolic: Goal: Ability to maintain appropriate glucose levels will improve 09/12/2023 0408 by Rosezella Conte, RN Outcome: Progressing 09/12/2023 0407 by Rosezella Conte, RN Outcome: Progressing   Problem: Clinical Measurements: Goal: Respiratory complications will improve 09/12/2023 0408 by Rosezella Conte, RN Outcome: Progressing 09/12/2023 0407 by Rosezella Conte, RN Outcome: Progressing   Problem: Pain Managment: Goal: General experience of comfort will improve and/or be controlled 09/12/2023 0408 by Rosezella Conte, RN Outcome: Progressing 09/12/2023 0407 by Rosezella Conte, RN Outcome: Progressing   Problem: Safety: Goal: Ability to remain free from injury will improve Outcome: Progressing   Problem: Skin Integrity: Goal: Risk for impaired skin integrity will decrease 09/12/2023 0408 by Rosezella Conte, RN Outcome: Progressing 09/12/2023 0407 by Rosezella Conte, RN Outcome: Progressing   Problem: Clinical Measurements: Goal: Postoperative complications will be avoided or minimized Outcome: Progressing   Problem: Skin Integrity: Goal: Demonstration of wound healing without infection will improve Outcome: Progressing

## 2023-09-12 NOTE — Progress Notes (Signed)
 PT Cancellation Note  Patient Details Name: Seth Burch MRN: [id number] DOB: 01/28/1956   Cancelled Treatment:    Reason Eval/Treat Not Completed: (P) Patient declined, no reason specified, pt politely declining mobility due to increased fatigue and poor rest last night. Will check back as schedule allows to continue with PT POC.  Beverly Buckler. PTA Acute Rehabilitation Services Office: [phone]    [name] 09/12/2023, 2:04 PM

## 2023-09-12 NOTE — Anesthesia Postprocedure Evaluation (Signed)
 Anesthesia Post Note  Patient: Seth Burch  Procedure(s) Performed: RADIOLOGY WITH ANESTHESIA     Patient location during evaluation: Nursing Unit Anesthesia Type: General Level of consciousness: awake and alert Pain management: pain level controlled Vital Signs Assessment: post-procedure vital signs reviewed and stable Respiratory status: spontaneous breathing, nonlabored ventilation and respiratory function stable Cardiovascular status: blood pressure returned to baseline and stable Postop Assessment: no apparent nausea or vomiting Anesthetic complications: no   No notable events documented.  Last Vitals:  Vitals:   09/12/23 1646 09/12/23 1933  BP: 104/64 117/66  Pulse: 89 94  Resp: 20 20  Temp: 37.2 C 36.8 C  SpO2: 96% 96%    Last Pain:  Vitals:   09/12/23 1933  TempSrc: Oral  PainSc:                  Erin Havers

## 2023-09-12 NOTE — Progress Notes (Signed)
 OT Cancellation Note  Patient Details Name: Seth Burch MRN: [id number] DOB: [birth year]   Cancelled Treatment:    Reason Eval/Treat Not Completed: Fatigue/lethargy limiting ability to participate.  Jonette Nestle 09/12/2023, 3:35 PM Avanell Leigh, OTR/L Acute Rehabilitation Services Office: [phone]

## 2023-09-12 NOTE — Progress Notes (Addendum)
 Nutrition Follow-up  DOCUMENTATION CODES:   Severe malnutrition in context of chronic illness  INTERVENTION:  TF infusing via J-tube: Vivonex at 65ml/hr (1560ml per day) 60ml ProSource TF20 BID  Providing 1720kcal (78% minimum estimated needs), 118g protein (103% of minimum estimated needs) and per day, 18g fat   Continue advancing TF back to goal as tolerated, per MD: Goal TF regimen- Vivonex at 70ml/hr (1680 ml per day) ProSource TF20 60ml BID  TF at goal provides- 1840 kcal (84% estimated needs), 124g protein (108% estimated needs), 1424 ml free water  daily, and 19g fat    Continue to encourage PO intake to meet 640kcal deficit  Continue nutrisource fiber BID to provide 3g soluble fiber per serving to aid in bulking of stool  Continue Florastor to aid in stool frequency and consistency    NUTRITION DIAGNOSIS:  Severe Malnutrition related to chronic illness (esophageal cancer s/p chemo, radiation and surgery) as evidenced by severe fat depletion, severe muscle depletion, percent weight loss. 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 Returned hours after discharge with c/o chest pain. PMH: DM, HTN, dyslipedemia, CKD, IgG kappa MGUS.  Previous Admission 3/10 EGD, XI Robotic Assisted Ivor Lewis Esophagectomy, J-tube placement 3/11 Trickle TF initiated 3/12 Surgery increased TF to 40 ml/hr 3/13 Surgery increased TF to 55 ml/hr, transferred to ICU  3/14 Surgery increased TF to 65 ml/hr (goal), bowel regimen added and +BM 3/15 Intubated, Bronch (Thick mucus in trachea suctioned, partially occluding L mainstem,  ?aspiration, recurrent false vocal cord irritation with swelling leading to stridor and resp failure) 3/17 Extubated 3/18 Esophogram:  Brisk tracheal aspiration of ingested contrast, into the LEFT mainstem bronchus. SLP eval recommended. Stagnant contrast at diaphragmatic hiatus. No anastomotic leak.  3/19 Diet advanced to Dysphagia 2/Thins, later downgraded to Nectar Thick Liquids 3/20 Transition to Nocturnal TF, family to bring protein shake in from home 3/21 Significant diarrhea overnight, (9 stools in 10 hours), significant J-tube drainage, change to Vivonex with day time feedings 3/24 Diet changed to Dysphagia 2, Thins per SLP 3/26 NPO due to concern for possible esophageal leak 3/27 advanced to Dysphagia 3, thin liquids  3/31 modify TF regimen for d/c 4/1 discharged home Current Admission 4/2 admitted; OR: R VATS, EGD, and esophageal stent placement 4/3 trial Johny Nap TF formula during the day (16 hours), transfusion 4/4 advanced to goal rate; leaking J-tube, TF held  4/5 txr to cardiovascular ICU 4/7 TF re-initiated, transfused  4/9 J-tube replaced by IR 4/10 significant leaking from J-tube; TFs on hold  4/11 PICC placed, TPN initiated, J-tube replaced again, WOUND VAC 4/12 re-initiate trickles s/p second J-tube replacement 4/13 PICC line replaced 4/14 slowly advance TF goal rate 4/16 TF to goal rate; TPN discontinued 4/17 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 Reported fever overnight. Does not feel great today, just fatigued. No new complaints. Cannot endorse how many bowel movements he is having a day, however documentation stating 1-2 loose stool daily.  Admit Weight: 77.1kg Current Weight:  82.6kg - ?accuracy, 7.3kg weight gain overnight Lowest Weight: 70.6kg  on 4/12   No significant edema on exam. Weight stable. Per IR, chest tube drainage may persist for a few days. Monitoring.    Intake/Output Summary (Last 24 hours) at 09/12/2023 1645 Last data filed at 09/12/2023 0600 Gross per 24 hour  Intake 2607.33 ml  Output 880 ml  Net 1727.33 ml    Net IO Since Admission: 7,464.15 mL [09/12/23 1645]    Drains/Lines: PICC (placed 4/11 and replaced 4/13) LUQ: Jejunostomy (20 Fr) R Lateral Pleural (19Fr): x24 hours Wound vac to R chest (placed 4/11): 0ml x24 hours UOP: 1L x24 hours   Insulin  regimen adjusted SSI 0-20 to QID from TID. Blood sugars improving. PICC line remains. MCT oil continues.   Meds: Nutrisource fiber, SSI 0-20 QID, SSI 3 q4 hours, Semglee  14 daily, pantoprazole , thiamine , MVI, MCT oil  Drips: IV ABX   Labs: no new labs for review  Diet Order:   Diet Order             DIET - DYS 1 Room service appropriate? Yes; Fluid consistency: Thin  Diet effective now             EDUCATION NEEDS:  Education needs have been addressed  Skin:  Incisions: R chest tube Other: MASD around J-tube  Last BM:  4/25 - type 6 x1  Height:  Ht Readings from Last 1 Encounters:  08/20/23 5\' 10"  (1.778 m)   Weight:  Wt Readings from Last 1 Encounters:  09/12/23 82.6 kg    BMI:  Body mass index is 26.13 kg/m.  Estimated Nutritional Needs:   Kcal:  2200-2400kcal  Protein:  115-130g  Fluid:  >=2L/day  Con Decant MS, RD, LDN Registered Dietitian Clinical Nutrition RD Inpatient Contact Info in Amion

## 2023-09-12 NOTE — Progress Notes (Addendum)
      [address]       Seth Burch [address]             [phone]      1 Day Post-Op Procedure(s) (LRB): RADIOLOGY WITH ANESTHESIA (N/A)  Subjective:  Patient without new complaints.  Continues to have 1 bout of diarrhea daily.  Denies N/V.  Objective: Vital signs in last 24 hours: Temp:  [97.5 F (36.4 C)-99.1 F (37.3 C)] 99.1 F (37.3 C) (04/25 0334) Pulse Rate:  [81-95] 93 (04/25 0334) Cardiac Rhythm: Normal sinus rhythm (04/24 2045) Resp:  [13-32] 20 (04/25 0334) BP: (107-138)/(49-103) 120/68 (04/25 0334) SpO2:  [90 %-100 %] 91 % (04/25 0334) Weight:  [82.6 kg] 82.6 kg (04/25 0334)  Intake/Output from previous day: 04/24 0701 - 04/25 0700 In: 3857.3 [P.O.:120; I.V.:1300; NG/GT:2097.3; IV Piggyback:250] Out: 1180 [Urine:1000; Chest Tube:180]  General appearance: alert, cooperative, and no distress Heart: regular rate and rhythm Lungs: clear to auscultation bilaterally Abdomen: soft, non-tender; bowel sounds normal; no masses,  no organomegaly Extremities: extremities normal, atraumatic, no cyanosis or edema Wound: pravena in place on right vats incision  Lab Results: Recent Labs    09/11/23 0550  WBC 7.0  HGB 12.1*  HCT 37.6*  PLT 321   BMET: No results for input(s): "NA", "K", "CL", "CO2", "GLUCOSE", "BUN", "CREATININE", "CALCIUM " in the last 72 hours.  PT/INR:  Recent Labs    09/11/23 0550  LABPROT 15.5*  INR 1.2   ABG    Component Value Date/Time   PHART 7.4 07/29/2023 0514   HCO3 25.8 08/02/2023 2006   TCO2 17 (L) 08/19/2023 2022   ACIDBASEDEF 1.0 08/02/2023 1506   O2SAT 94.5 08/02/2023 2006   CBG (last 3)  Recent Labs    09/11/23 1959 09/11/23 2314 09/12/23 0331  GLUCAP 110* 150* 166*    Assessment/Plan: S/P Procedure(s) (LRB): RADIOLOGY WITH ANESTHESIA (N/A)  CV- NSR, BP remains stable- continue Lopressor , Norvasc  Chylothorax-S/P Embolization, 80 cc in pleurovac... monitor, per IR suspect drainage to continue  for few days GI- continue dysphagia 1 diet, tube feedings will increase today to see if patient can tolerate DM- sugars have been stable, continue current care Dispo- patient stable, S/p embolization for Chylothorax, per IR drainage may persist for a few days, 80 cc output currently in pleurovac, increase tube feedings to 65 ml/hr, continue current care   LOS: 24 days   Seth Kasal, PA-C 09/12/2023  Agree S/p embolization of chyle leak Dispo planning  Seth Burch O Seth Burch

## 2023-09-12 NOTE — Progress Notes (Signed)
 Referring Physician(s): Dr. 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 Mabel Savage and Dr. Jinx Mourning.   Subjective: Patient resting in bed watching TV. He denies any pain or discomfort but states he is very tired and worn out. 200 ml in pleur evac container.    Allergies: Patient has no known allergies.  Medications: Prior to Admission medications   Medication Sig Start Date End Date Taking? Authorizing Provider  acetaminophen  (TYLENOL ) 325 MG tablet Take 1-2 tablets (325-650 mg total) by mouth every 4 (four) hours as needed. 08/19/23 08/18/24 Yes Barrett, Erin R, PA-C  amLODipine  (NORVASC ) 10 MG tablet Take 1 tablet (10 mg total) by mouth daily. 08/19/23  Yes Barrett, Malachy Scripture, PA-C  aspirin EC 81 MG tablet Take 81 mg by mouth daily.   Yes [provider]  atorvastatin  (LIPITOR) 20 MG tablet Take 20 mg by mouth at bedtime. 02/26/19  Yes [provider]  LANTUS  SOLOSTAR 100 UNIT/ML Solostar Pen Inject 26 Units into the skin daily. 02/19/23  Yes [provider]  Menthol-Methyl Salicylate (ICY HOT ORIGINAL PAIN RELIEF) 10-30 % CREA Apply 1 Application topically as needed (Rt Shoulder and back pain.).   Yes [provider]  metoprolol  tartrate (LOPRESSOR ) 25 MG tablet Take 0.5 tablets (12.5 mg total) by mouth 2 (two) times daily. 08/19/23  Yes Barrett, Erin R, PA-C  Nystatin (GERHARDT'S BUTT CREAM) CREA Apply 1 Application topically 3 (three) times daily. 08/19/23  Yes Barrett, Erin R, PA-C  omeprazole (PRILOSEC) 40 MG capsule Take 40 mg by mouth daily. 03/13/23  Yes [provider]  oxyCODONE  (OXY IR/ROXICODONE ) 5 MG immediate release tablet Take 1 tablet (5 mg total) by mouth every 4 (four) hours as needed for severe pain (pain score 7-10). 08/19/23  Yes Barrett, Erin R, PA-C  Protein (FEEDING SUPPLEMENT, PROSOURCE TF20,) liquid Place 60 mLs into feeding tube 2  (two) times daily. 08/19/23  Yes Barrett, Erin R, PA-C  tamsulosin  (FLOMAX ) 0.4 MG CAPS capsule Take 0.4 mg by mouth daily. 02/08/23  Yes [provider]     Vital Signs: BP 103/67 (BP Location: Left Arm)   Pulse 83   Temp 98.4 F (36.9 C) (Oral)   Resp 20   Ht 5\' 10"  (1.778 m)   Wt 182 lb 1.6 oz (82.6 kg)   SpO2 97%   BMI 26.13 kg/m   Physical Exam Constitutional:      General: He is not in acute distress.    Appearance: He is not ill-appearing.  Cardiovascular:     Rate and Rhythm: Normal rate.     Comments: Bilateral femoral vascular sites are clean, soft, dry and non-tender.  Pulmonary:     Effort: Pulmonary effort is normal.     Comments: Right chest tube. Approximately 200 ml of chylous output in pleur evac  Abdominal:     Comments: G-tube  Skin:    General: Skin is warm and dry.  Neurological:     Mental Status: He is alert and oriented to person, place, and time.  Psychiatric:        Mood and Affect: Mood normal.        Behavior: Behavior normal.        Thought Content: Thought content normal.        Judgment: Judgment normal.     Imaging: IR LYMPHANGIOGRAM PEL/ABD UNI LEFT Result Date: 09/12/2023 INDICATION: 68 year old male  with esophagectomy, chylous effusion, presents for lymphangiogram and possible thoracic duct embolization EXAM: ULTRASOUND-GUIDED NEEDLE PLACEMENT RIGHT INGUINAL LYMPH NODE ULTRASOUND-GUIDED NEEDLE PLACEMENT LEFT INGUINAL LYMPH NODE LYMPHANGIOGRAM IMAGE GUIDED NEEDLE PLACEMENT THORACIC DUCT ULTRASOUND-GUIDED ACCESS LEFT BASILIC VEIN WITH VENOGRAM SUPER SELECTIVE CATHETER PLACEMENT VIA TRANS VENOUS APPROACH INTO THE THORACIC DUCT COIL AND GLUE EMBOLIZATION OF THORACIC DUCT COMPARISON:  MULTIPLE PRIOR CHEST X-RAY MEDICATIONS: None. ANESTHESIA/SEDATION: The anesthesia team was present to provide general endotracheal tube anesthesia and for patient monitoring during the procedure. Intubation was performed in INTERVENTIONAL RADIOLOGY.  Interventional radiology nursing staff was also present. FLUOROSCOPY: Radiation Exposure Index (as provided by the fluoroscopic device): 1,184 mGy Kerma COMPLICATIONS: None TECHNIQUE: Informed written consent was obtained from the patient after a thorough discussion of the procedural risks, benefits and alternatives. All questions were addressed. Maximal Sterile Barrier Technique was utilized including caps, mask, sterile gowns, sterile gloves, sterile drape, hand hygiene and skin antiseptic. A timeout was performed prior to the initiation of the procedure. The patient was intubated by the anesthesia team in interventional radiology. The patient was supine on the image intensifier, with prepped and draped in the usual sterile fashion. Due to the complexity of the case, a second physician was required to be present during all steps of the case, Dr. Creasie Doctor. Ultrasound guidance was used to place a 25 gauge spinal needle into the right inguinal lymph node, positioning the needle tip at the interface of the cortex and hilum of the lymph node. Ultrasound guidance was used to place a 25 gauge spinal needle into the left inguinal lymph node, positioning the needle tip at the interface of cortex and hilum of the left node. We then initiated lymphangiogram with slow infusion of lipiodol with intermittent fluoroscopic observation. Opacification progressed faster from the right-side in the right-sided pelvic lymph system into the abdomen. Ultimately when the lymphatics of the upper abdomen and the thoracic duct were identified, we attempted to perform a fluoroscopic guided puncture of the thoracic duct. Majority of the anesthesia time and fluoroscopic time was invested with attempt to puncture the thoracic duct via a transabdominal approach. This was performed with a combination of 15 and 20 cm 21 gauge Chiba needles. Ultimately unsuccessful with cannulating the thoracic duct via transabdominal approach. Given that there  was good opacification of the terminal thoracic duct at its confluence with the left subclavian vein, we elected to try retrograde approach. Ultrasound-guided access of the left basilic vein was performed. A 7 French short sheath was placed, and a Willi wire and 5 [place] cobra catheter was advanced retrograde. Using the fluoroscopic landmark of the thoracic duct terminus, we were able to cannulate the valve with a combination of a V18 wire and a Progreat microcatheter. The microcatheter and microwire were advanced retrograde through the thoracic duct. The 5 French catheter was advanced over the microcatheter and microwire combination. Once the 5 French catheter was in a downgoing position retrograde in the thoracic duct the microcatheter and microwire were removed. 035 Willi wire was placed into the thoracic duct. The 5 [place] base catheter was removed, 7 [place] short sheath was removed and a 6 French 45 cm straight tip to room 0 destination sheath was placed into the very terminus of the thoracic duct. The 5 [place] cobra catheter was advanced down the wholey wire, wholey was removed, and parallel the 18 wires were placed into the thoracic duct. The 5 French catheter was removed from the 6 [place] sheath and then parallel Progreat micro catheters  were placed. One microcatheter was position distally in the lower thoracic duct at the T12 level, and the more proximal microcatheter was positioned at the level of the clavicular heads. Coil embolization with a series of Ruby low-profile microcoils was performed. N BCA glue embolization was then performed through the remainder of the thoracic duct, from the T12 level to the more proximal coil pack. All micro catheters were then removed. Sheath was removed and manual pressure was used for hemostasis. Final images were acquired. Sterile bandage is placed at the puncture sites in both the abdomen and the bilateral inguinal region. Patient tolerated the procedure well and  remained hemodynamically stable throughout. No complications were encountered and no significant blood loss. FINDINGS: Lymphangiogram demonstrates partial opacification of the thoracic duct. Images demonstrate retrograde cannulation of the thoracic duct at the confluence to the subclavian vein. 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 Signed, Marciano Settles. Rexine Cater, RPVI [facility] Rad[facility] Radiology Electronically Signed   By: Myrlene Asper D.O.   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 ANESTHESIA/SEDATION: The anesthesia team was present to provide general endotracheal tube anesthesia and for patient monitoring during the procedure. Intubation was performed in INTERVENTIONAL RADIOLOGY. Interventional radiology nursing staff was also present. FLUOROSCOPY: Radiation Exposure Index (as provided by the fluoroscopic device): 1,184 mGy Kerma COMPLICATIONS: None TECHNIQUE: Informed written consent was obtained from the patient after a thorough discussion of  the procedural risks, benefits and alternatives. All questions were addressed. Maximal Sterile Barrier Technique was utilized including caps, mask, sterile gowns, sterile gloves, sterile drape, hand hygiene and skin antiseptic. A timeout was performed prior to the initiation of the procedure. The patient was intubated by the anesthesia team in interventional radiology. The patient was supine on the image intensifier, with prepped and draped in the usual sterile fashion. Due to the complexity of the case, a second physician was required to be present during all steps of the case, Dr. Creasie Doctor. Ultrasound guidance was used to place a 25 gauge spinal needle into the right inguinal lymph node, positioning the needle tip at the interface of the cortex and hilum of the lymph node. Ultrasound guidance was used to place a 25 gauge spinal needle into the left inguinal lymph node, positioning the needle tip at the interface of cortex and hilum of the left node. We then initiated lymphangiogram with slow infusion of lipiodol with intermittent fluoroscopic observation. Opacification progressed faster from the right-side in the right-sided pelvic lymph system into the abdomen. Ultimately when the lymphatics of the upper abdomen and the thoracic duct were identified, we attempted to perform a fluoroscopic guided puncture of the thoracic duct. Majority of the anesthesia time and fluoroscopic time was invested with attempt to puncture the thoracic duct via a  transabdominal approach. This was performed with a combination of 15 and 20 cm 21 gauge Chiba needles. Ultimately unsuccessful with cannulating the thoracic duct via transabdominal approach. Given that there was good opacification of the terminal thoracic duct at its confluence with the left subclavian vein, we elected to try retrograde approach. Ultrasound-guided access of the left basilic vein was performed. A 7 French short sheath was placed, and a Willi wire and 5  [place] cobra catheter was advanced retrograde. Using the fluoroscopic landmark of the thoracic duct terminus, we were able to cannulate the valve with a combination of a V18 wire and a Progreat microcatheter. The microcatheter and microwire were advanced retrograde through the thoracic duct. The 5 French catheter was advanced over the microcatheter and microwire combination. Once the 5 French catheter was in a downgoing position retrograde in the thoracic duct the microcatheter and microwire were removed. 035 Willi wire was placed into the thoracic duct. The 5 [place] base catheter was removed, 7 [place] short sheath was removed and a 6 French 45 cm straight tip to room 0 destination sheath was placed into the very terminus of the thoracic duct. The 5 [place] cobra catheter was advanced down the wholey wire, wholey was removed, and parallel the 18 wires were placed into the thoracic duct. The 5 French catheter was removed from the 6 [place] sheath and then parallel Progreat micro catheters were placed. One microcatheter was position distally in the lower thoracic duct at the T12 level, and the more proximal microcatheter was positioned at the level of the clavicular heads. Coil embolization with a series of Ruby low-profile microcoils was performed. N BCA glue embolization was then performed through the remainder of the thoracic duct, from the T12 level to the more proximal coil pack. All micro catheters were then removed. Sheath was removed and manual pressure was used for hemostasis. Final images were acquired. Sterile bandage is placed at the puncture sites in both the abdomen and the bilateral inguinal region. Patient tolerated the procedure well and remained hemodynamically stable throughout. No complications were encountered and no significant blood loss. FINDINGS: Lymphangiogram demonstrates partial opacification of the thoracic duct. Images demonstrate retrograde cannulation of the thoracic duct at the  confluence to the subclavian vein. 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 Signed, Marciano Settles. Rexine Cater, RPVI Vascular and Interventional Radiology Specialists [facility] Radiology Electronically Signed   By: Myrlene Asper D.O.   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 ANESTHESIA/SEDATION: The anesthesia team was present to provide general endotracheal tube anesthesia and for patient monitoring during the procedure. Intubation was performed in INTERVENTIONAL RADIOLOGY. Interventional radiology nursing staff was also present. FLUOROSCOPY: Radiation Exposure Index (as provided by the fluoroscopic device): 1,184 mGy Kerma COMPLICATIONS: None TECHNIQUE: Informed written consent was obtained  from the patient after a thorough discussion of the procedural risks, benefits and alternatives. All questions were addressed. Maximal Sterile Barrier Technique was utilized including caps, mask, sterile gowns, sterile gloves, sterile drape, hand hygiene and skin antiseptic. A timeout was performed prior to  the initiation of the procedure. The patient was intubated by the anesthesia team in interventional radiology. The patient was supine on the image intensifier, with prepped and draped in the usual sterile fashion. Due to the complexity of the case, a second physician was required to be present during all steps of the case, Dr. Creasie Doctor. Ultrasound guidance was used to place a 25 gauge spinal needle into the right inguinal lymph node, positioning the needle tip at the interface of the cortex and hilum of the lymph node. Ultrasound guidance was used to place a 25 gauge spinal needle into the left inguinal lymph node, positioning the needle tip at the interface of cortex and hilum of the left node. We then initiated lymphangiogram with slow infusion of lipiodol with intermittent fluoroscopic observation. Opacification progressed faster from the right-side in the right-sided pelvic lymph system into the abdomen. Ultimately when the lymphatics of the upper abdomen and the thoracic duct were identified, we attempted to perform a fluoroscopic guided puncture of the thoracic duct. Majority of the anesthesia time and fluoroscopic time was invested with attempt to puncture the thoracic duct via a transabdominal approach. This was performed with a combination of 15 and 20 cm 21 gauge Chiba needles. Ultimately unsuccessful with cannulating the thoracic duct via transabdominal approach. Given that there was good opacification of the terminal thoracic duct at its confluence with the left subclavian vein, we elected to try retrograde approach. Ultrasound-guided access of the left basilic vein was performed. A 7 French short sheath was placed, and a Willi wire and 5 [place] cobra catheter was advanced retrograde. Using the fluoroscopic landmark of the thoracic duct terminus, we were able to cannulate the valve with a combination of a V18 wire and a Progreat microcatheter. The microcatheter and microwire were advanced retrograde  through the thoracic duct. The 5 French catheter was advanced over the microcatheter and microwire combination. Once the 5 French catheter was in a downgoing position retrograde in the thoracic duct the microcatheter and microwire were removed. 035 Willi wire was placed into the thoracic duct. The 5 [place] base catheter was removed, 7 [place] short sheath was removed and a 6 French 45 cm straight tip to room 0 destination sheath was placed into the very terminus of the thoracic duct. The 5 [place] cobra catheter was advanced down the wholey wire, wholey was removed, and parallel the 18 wires were placed into the thoracic duct. The 5 French catheter was removed from the 6 [place] sheath and then parallel Progreat micro catheters were placed. One microcatheter was position distally in the lower thoracic duct at the T12 level, and the more proximal microcatheter was positioned at the level of the clavicular heads. Coil embolization with a series of Ruby low-profile microcoils was performed. N BCA glue embolization was then performed through the remainder of the thoracic duct, from the T12 level to the more proximal coil pack. All micro catheters were then removed. Sheath was removed and manual pressure was used for hemostasis. Final images were acquired. Sterile bandage is placed at the puncture sites in both the abdomen and the bilateral inguinal region. Patient tolerated the procedure well and remained hemodynamically stable throughout. No complications were encountered and no significant blood loss.  FINDINGS: Lymphangiogram demonstrates partial opacification of the thoracic duct. Images demonstrate retrograde cannulation of the thoracic duct at the confluence to the subclavian vein. 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 Signed, Marciano Settles. Rexine Cater, RPVI Vascular and Interventional Radiology Specialists [facility] Radiology Electronically Signed   By: Myrlene Asper D.O.   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 ANESTHESIA/SEDATION: The anesthesia team was present to provide general endotracheal tube anesthesia and for patient monitoring during the procedure. Intubation was performed in INTERVENTIONAL RADIOLOGY. Interventional radiology nursing staff was also present. FLUOROSCOPY: Radiation Exposure Index (as provided by the fluoroscopic device): 1,184 mGy Kerma COMPLICATIONS: None TECHNIQUE: Informed written consent was obtained from the patient after a thorough discussion of the procedural risks, benefits and alternatives. All questions were addressed. Maximal Sterile Barrier Technique was utilized including caps, mask, sterile gowns, sterile gloves, sterile drape, hand hygiene and skin antiseptic. A timeout was performed prior to the initiation of the procedure. The patient was intubated by the anesthesia team in interventional radiology. The patient was supine on the image intensifier, with prepped and draped in the usual sterile fashion. Due to the complexity of the case, a second physician was required to be  present during all steps of the case, Dr. Creasie Doctor. Ultrasound guidance was used to place a 25 gauge spinal needle into the right inguinal lymph node, positioning the needle tip at the interface of the cortex and hilum of the lymph node. Ultrasound guidance was used to place a 25 gauge spinal needle into the left inguinal lymph node, positioning the needle tip at the interface of cortex and hilum of the left node. We then initiated lymphangiogram with slow infusion of lipiodol with intermittent fluoroscopic observation. Opacification progressed faster from the right-side in the right-sided pelvic lymph system into the abdomen. Ultimately when the lymphatics of the upper abdomen and the thoracic duct were identified, we attempted to perform a fluoroscopic guided puncture of the thoracic duct. Majority of the anesthesia time and fluoroscopic time was invested with attempt to puncture the thoracic duct via a transabdominal approach. This was performed with a combination of 15 and 20 cm 21 gauge Chiba needles. Ultimately unsuccessful with cannulating the thoracic duct via transabdominal approach. Given that there was good opacification of the terminal thoracic duct at its confluence with the left subclavian vein, we elected to try retrograde approach. Ultrasound-guided access of the left basilic vein was performed. A 7 French short sheath was placed, and a Willi wire and 5 [place] cobra catheter was advanced retrograde. Using the fluoroscopic landmark of the thoracic duct terminus, we were able to cannulate the valve with a combination of a V18 wire and a Progreat microcatheter. The microcatheter and microwire were advanced retrograde through the thoracic duct. The 5 French catheter was advanced  over the microcatheter and microwire combination. Once the 5 French catheter was in a downgoing position retrograde in the thoracic duct the microcatheter and microwire were removed. 035 Willi wire was placed into the  thoracic duct. The 5 [place] base catheter was removed, 7 [place] short sheath was removed and a 6 French 45 cm straight tip to room 0 destination sheath was placed into the very terminus of the thoracic duct. The 5 [place] cobra catheter was advanced down the wholey wire, wholey was removed, and parallel the 18 wires were placed into the thoracic duct. The 5 French catheter was removed from the 6 [place] sheath and then parallel Progreat micro catheters were placed. One microcatheter was position distally in the lower thoracic duct at the T12 level, and the more proximal microcatheter was positioned at the level of the clavicular heads. Coil embolization with a series of Ruby low-profile microcoils was performed. N BCA glue embolization was then performed through the remainder of the thoracic duct, from the T12 level to the more proximal coil pack. All micro catheters were then removed. Sheath was removed and manual pressure was used for hemostasis. Final images were acquired. Sterile bandage is placed at the puncture sites in both the abdomen and the bilateral inguinal region. Patient tolerated the procedure well and remained hemodynamically stable throughout. No complications were encountered and no significant blood loss. FINDINGS: Lymphangiogram demonstrates partial opacification of the thoracic duct. Images demonstrate retrograde cannulation of the thoracic duct at the confluence to the subclavian vein. 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 Signed, Marciano Settles. Rexine Cater, RPVI V[facility] and Interventional Radiology Specialists [facility] Radiology Electronically Signed   By: Myrlene Asper D.O.   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 ANESTHESIA/SEDATION: The anesthesia team was present to provide general endotracheal tube anesthesia and for patient monitoring during the procedure. Intubation was performed in INTERVENTIONAL RADIOLOGY. Interventional radiology nursing staff was also present. FLUOROSCOPY: Radiation Exposure Index (as provided by the fluoroscopic device): 1,184 mGy Kerma COMPLICATIONS: None TECHNIQUE: Informed written consent was obtained from the patient after a thorough discussion of the procedural risks, benefits and alternatives. All questions were addressed. Maximal Sterile Barrier Technique was utilized including caps, mask, sterile gowns, sterile gloves, sterile drape, hand hygiene and skin antiseptic. A timeout was performed prior to the initiation of the procedure. The patient was intubated by the anesthesia team in interventional radiology. The patient was supine on the image intensifier, with prepped and draped in the usual sterile fashion. Due to the complexity of the case, a second physician was required to be present during all steps of the case, Dr. Creasie Doctor. Ultrasound guidance was used to place a 25 gauge spinal needle into the right inguinal lymph node, positioning the needle tip at the interface of the cortex  and hilum of the lymph node. Ultrasound guidance was used to place a  25 gauge spinal needle into the left inguinal lymph node, positioning the needle tip at the interface of cortex and hilum of the left node. We then initiated lymphangiogram with slow infusion of lipiodol with intermittent fluoroscopic observation. Opacification progressed faster from the right-side in the right-sided pelvic lymph system into the abdomen. Ultimately when the lymphatics of the upper abdomen and the thoracic duct were identified, we attempted to perform a fluoroscopic guided puncture of the thoracic duct. Majority of the anesthesia time and fluoroscopic time was invested with attempt to puncture the thoracic duct via a transabdominal approach. This was performed with a combination of 15 and 20 cm 21 gauge Chiba needles. Ultimately unsuccessful with cannulating the thoracic duct via transabdominal approach. Given that there was good opacification of the terminal thoracic duct at its confluence with the left subclavian vein, we elected to try retrograde approach. Ultrasound-guided access of the left basilic vein was performed. A 7 French short sheath was placed, and a Willi wire and 5 [place] cobra catheter was advanced retrograde. Using the fluoroscopic landmark of the thoracic duct terminus, we were able to cannulate the valve with a combination of a V18 wire and a Progreat microcatheter. The microcatheter and microwire were advanced retrograde through the thoracic duct. The 5 French catheter was advanced over the microcatheter and microwire combination. Once the 5 French catheter was in a downgoing position retrograde in the thoracic duct the microcatheter and microwire were removed. 035 Willi wire was placed into the thoracic duct. The 5 [place] base catheter was removed, 7 [place] short sheath was removed and a 6 French 45 cm straight tip to room 0 destination sheath was placed into the very terminus of the thoracic duct. The 5 [place] cobra catheter was advanced down the wholey wire, wholey was  removed, and parallel the 18 wires were placed into the thoracic duct. The 5 French catheter was removed from the 6 [place] sheath and then parallel Progreat micro catheters were placed. One microcatheter was position distally in the lower thoracic duct at the T12 level, and the more proximal microcatheter was positioned at the level of the clavicular heads. Coil embolization with a series of Ruby low-profile microcoils was performed. N BCA glue embolization was then performed through the remainder of the thoracic duct, from the T12 level to the more proximal coil pack. All micro catheters were then removed. Sheath was removed and manual pressure was used for hemostasis. Final images were acquired. Sterile bandage is placed at the puncture sites in both the abdomen and the bilateral inguinal region. Patient tolerated the procedure well and remained hemodynamically stable throughout. No complications were encountered and no significant blood loss. FINDINGS: Lymphangiogram demonstrates partial opacification of the thoracic duct. Images demonstrate retrograde cannulation of the thoracic duct at the confluence to the subclavian vein. 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 Signed, Marciano Settles. Rexine Cater, RPVI Vascular and Interventional Radiology Specialists [facility] Radiology Electronically Signed   By: Myrlene Asper D.O.   On: 09/12/2023 10:24   IR EMBO ART  VEN HEMORR LYMPH EXTRAV  INC GUIDE ROADMAPPING Result Date: 09/12/2023 INDICATION: 68 year old male with esophagectomy, chylous effusion, presents  for lymphangiogram and possible thoracic duct embolization EXAM: ULTRASOUND-GUIDED  NEEDLE PLACEMENT RIGHT INGUINAL LYMPH NODE ULTRASOUND-GUIDED NEEDLE PLACEMENT LEFT INGUINAL LYMPH NODE LYMPHANGIOGRAM IMAGE GUIDED NEEDLE PLACEMENT THORACIC DUCT ULTRASOUND-GUIDED ACCESS LEFT BASILIC VEIN WITH VENOGRAM SUPER SELECTIVE CATHETER PLACEMENT VIA TRANS VENOUS APPROACH INTO THE THORACIC DUCT COIL AND GLUE EMBOLIZATION OF THORACIC DUCT COMPARISON:  MULTIPLE PRIOR CHEST X-RAY MEDICATIONS: None. ANESTHESIA/SEDATION: The anesthesia team was present to provide general endotracheal tube anesthesia and for patient monitoring during the procedure. Intubation was performed in INTERVENTIONAL RADIOLOGY. Interventional radiology nursing staff was also present. FLUOROSCOPY: Radiation Exposure Index (as provided by the fluoroscopic device): 1,184 mGy Kerma COMPLICATIONS: None TECHNIQUE: Informed written consent was obtained from the patient after a thorough discussion of the procedural risks, benefits and alternatives. All questions were addressed. Maximal Sterile Barrier Technique was utilized including caps, mask, sterile gowns, sterile gloves, sterile drape, hand hygiene and skin antiseptic. A timeout was performed prior to the initiation of the procedure. The patient was intubated by the anesthesia team in interventional radiology. The patient was supine on the image intensifier, with prepped and draped in the usual sterile fashion. Due to the complexity of the case, a second physician was required to be present during all steps of the case, Dr. Creasie Doctor. Ultrasound guidance was used to place a 25 gauge spinal needle into the right inguinal lymph node, positioning the needle tip at the interface of the cortex and hilum of the lymph node. Ultrasound guidance was used to place a 25 gauge spinal needle into the left inguinal lymph node, positioning the needle tip at the interface of cortex and hilum of the left node. We then initiated lymphangiogram with slow infusion of lipiodol with intermittent fluoroscopic  observation. Opacification progressed faster from the right-side in the right-sided pelvic lymph system into the abdomen. Ultimately when the lymphatics of the upper abdomen and the thoracic duct were identified, we attempted to perform a fluoroscopic guided puncture of the thoracic duct. Majority of the anesthesia time and fluoroscopic time was invested with attempt to puncture the thoracic duct via a transabdominal approach. This was performed with a combination of 15 and 20 cm 21 gauge Chiba needles. Ultimately unsuccessful with cannulating the thoracic duct via transabdominal approach. Given that there was good opacification of the terminal thoracic duct at its confluence with the left subclavian vein, we elected to try retrograde approach. Ultrasound-guided access of the left basilic vein was performed. A 7 French short sheath was placed, and a Willi wire and 5 [place] cobra catheter was advanced retrograde. Using the fluoroscopic landmark of the thoracic duct terminus, we were able to cannulate the valve with a combination of a V18 wire and a Progreat microcatheter. The microcatheter and microwire were advanced retrograde through the thoracic duct. The 5 French catheter was advanced over the microcatheter and microwire combination. Once the 5 French catheter was in a downgoing position retrograde in the thoracic duct the microcatheter and microwire were removed. 035 Willi wire was placed into the thoracic duct. The 5 [place] base catheter was removed, 7 [place] short sheath was removed and a 6 French 45 cm straight tip to room 0 destination sheath was placed into the very terminus of the thoracic duct. The 5 [place] cobra catheter was advanced down the wholey wire, wholey was removed, and parallel the 18 wires were placed into the thoracic duct. The 5 French catheter was removed from the 6 [place] sheath and then parallel Progreat micro catheters were placed. One microcatheter was  position distally in the lower  thoracic duct at the T12 level, and the more proximal microcatheter was positioned at the level of the clavicular heads. Coil embolization with a series of Ruby low-profile microcoils was performed. N BCA glue embolization was then performed through the remainder of the thoracic duct, from the T12 level to the more proximal coil pack. All micro catheters were then removed. Sheath was removed and manual pressure was used for hemostasis. Final images were acquired. Sterile bandage is placed at the puncture sites in both the abdomen and the bilateral inguinal region. Patient tolerated the procedure well and remained hemodynamically stable throughout. No complications were encountered and no significant blood loss. FINDINGS: Lymphangiogram demonstrates partial opacification of the thoracic duct. Images demonstrate retrograde cannulation of the thoracic duct at the confluence to the subclavian vein. 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 Signed, Marciano Settles. Rexine Cater, RPVI Vascular and Interventional Radiology Specialists [facility] Radiology Electronically Signed   By: Myrlene Asper D.O.   On: 09/12/2023 10:24    Labs:  CBC: Recent Labs    09/03/23 0455 09/03/23 1605 09/04/23 0332 09/05/23 0255 09/11/23 0550  WBC 6.1  --  6.7 5.5 7.0  HGB 6.8* 8.2* 8.1* 7.9* 12.1*  HCT 21.4* 25.4* 24.9* 24.4* 37.6*  PLT 298  --  305 327 321    COAGS: Recent Labs    07/24/23 1145 08/19/23 2018 08/20/23 0937 09/11/23 0550  INR 0.9 1.2 1.2 1.2  APTT 28 30  --   --     BMP: Recent Labs    09/03/23 0455 09/04/23 0332 09/05/23 0255 09/08/23 0500  NA 137 138 137 137  K 4.0  4.3 4.3 3.6  CL 106 105 103 102  CO2 25 26 26 27   GLUCOSE 133* 98 175* 140*  BUN 26* 25* 25* 23  CALCIUM  6.6* 7.2* 7.5* 7.4*  CREATININE 1.44* 1.44* 1.43* 1.40*  GFRNONAA 54* 54* 54* 55*    LIVER FUNCTION TESTS: Recent Labs    08/24/23 0236 08/27/23 0252 08/30/23 0440 09/01/23 0436  BILITOT 0.3 0.6 0.4 0.5  AST 32 23 24 29   ALT 28 20 18 20   ALKPHOS 254* 209* 236* 228*  PROT 5.3* 5.5* 5.1* 5.0*  ALBUMIN  1.7* 1.7* 1.5* <1.5*    Assessment and Plan:  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 Mabel Savage and Dr. Jinx Mourning.   Patient doing well post-procedure. 200 ml output today. Patient previously averaging 1L or greater output. Vascular access sites are clean, soft and dry.  Stable vital signs.   Plans per primary teams.   Electronically Signed: Jetta Morrow, AGACNP-BC 09/12/2023, 2:08 PM   I spent a total of 15 Minutes at the the patient's bedside AND on the patient's hospital floor or unit, greater than 50% of which was counseling/coordinating care for chylothorax.

## 2023-09-13 LAB — GLUCOSE, CAPILLARY
Glucose-Capillary: 134 mg/dL — ABNORMAL HIGH (ref 70–99)
Glucose-Capillary: 172 mg/dL — ABNORMAL HIGH (ref 70–99)
Glucose-Capillary: 293 mg/dL — ABNORMAL HIGH (ref 70–99)
Glucose-Capillary: 149 mg/dL — ABNORMAL HIGH (ref 70–99)
Glucose-Capillary: 138 mg/dL — ABNORMAL HIGH (ref 70–99)

## 2023-09-13 LAB — MISC LABCORP TEST (SEND OUT): Labcorp test code: 9985

## 2023-09-13 NOTE — Plan of Care (Signed)
°  Problem: Coping: Goal: Ability to adjust to condition or change in health will improve Outcome: Not Progressing   Problem: Fluid Volume: Goal: Ability to maintain a balanced intake and output will improve Outcome: Progressing

## 2023-09-13 NOTE — Progress Notes (Addendum)
 [address]       [place] [address]             [phone]     2 Days Post-Op Procedure(s) (LRB): RADIOLOGY WITH ANESTHESIA (N/A) Subjective: Feels "weaker "today  Objective: Vital signs in last 24 hours: Temp:  [98.2 F (36.8 C)-99 F (37.2 C)] 98.2 F (36.8 C) (04/26 0800) Pulse Rate:  [83-96] 96 (04/26 0322) Cardiac Rhythm: Normal sinus rhythm (04/25 1900) Resp:  [20-24] 20 (04/26 0322) BP: (103-127)/(64-74) 123/70 (04/26 0800) SpO2:  [90 %-97 %] 92 % (04/26 0322) Weight:  [82.4 kg] 82.4 kg (04/26 0021)  Hemodynamic parameters for last 24 hours:    Intake/Output from previous day: 04/25 0701 - 04/26 0700 In: -  Out: 260 [Chest Tube:260] Intake/Output this shift: No intake/output data recorded.  General appearance: alert, cooperative, and no distress Heart: regular rate and rhythm Lungs: clear to auscultation bilaterally Abdomen: benign Extremities: no edema or calf tenderness Wound: incis healing well  Lab Results: Recent Labs    09/11/23 0550  WBC 7.0  HGB 12.1*  HCT 37.6*  PLT 321   BMET: No results for input(s): "NA", "K", "CL", "CO2", "GLUCOSE", "BUN", "CREATININE", "CALCIUM " in the last 72 hours.  PT/INR:  Recent Labs    09/11/23 0550  LABPROT 15.5*  INR 1.2   ABG    Component Value Date/Time   PHART 7.4 07/29/2023 0514   HCO3 25.8 08/02/2023 2006   TCO2 17 (L) 08/19/2023 2022   ACIDBASEDEF 1.0 08/02/2023 1506   O2SAT 94.5 08/02/2023 2006   CBG (last 3)  Recent Labs    09/12/23 2311 09/13/23 0321 09/13/23 0810  GLUCAP 179* 134* 172*    Meds Scheduled Meds:  amLODipine   10 mg Oral Daily   atorvastatin   20 mg Oral Daily   Chlorhexidine  Gluconate Cloth  6 each Topical Daily   enoxaparin  (LOVENOX ) injection  40 mg Subcutaneous Q24H   feeding supplement (PROSource TF20)  60 mL Per Tube BID   fiber  1 packet Per Tube BID   Gerhardt's butt cream  1 Application Topical TID   insulin  aspart  0-20 Units  Subcutaneous Q4H   insulin  aspart  3 Units Subcutaneous Q4H   insulin  glargine-yfgn  14 Units Subcutaneous Daily   liver oil-zinc  oxide   Topical TID   medium chain triglycerides   15 mL Oral TID   metoprolol  tartrate  25 mg Oral BID   multivitamin with minerals  1 tablet Oral Daily   octreotide   100 mcg Subcutaneous TID   mouth rinse  15 mL Mouth Rinse 4 times per day   pantoprazole   40 mg Oral Daily   saccharomyces boulardii  250 mg Oral BID   sodium chloride  flush  10-40 mL Intracatheter Q12H   tamsulosin   0.4 mg Oral Daily   thiamine   100 mg Oral Daily   Continuous Infusions:  Vivonex RTF 1,000 mL (09/12/23 0844)   PRN Meds:.acetaminophen  (TYLENOL ) oral liquid 160 mg/5 mL, HYDROmorphone  (DILAUDID ) injection, lip balm, ondansetron  (ZOFRAN ) IV, mouth rinse, oxyCODONE , polyvinyl alcohol , sodium chloride  flush  Xrays IR LYMPHANGIOGRAM PEL/ABD UNI LEFT Result Date: 09/12/2023 INDICATION: 68 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 ANESTHESIA/SEDATION: The anesthesia team was present to provide general endotracheal tube anesthesia and for patient monitoring during the procedure. Intubation was performed in INTERVENTIONAL RADIOLOGY. Interventional radiology nursing staff was also present. FLUOROSCOPY: Radiation Exposure Index (as provided by the fluoroscopic device): 1,184 mGy Kerma COMPLICATIONS: None TECHNIQUE: Informed written consent was obtained from the patient after a thorough discussion of  the procedural risks, benefits and alternatives. All questions were addressed. Maximal Sterile Barrier Technique was utilized including caps, mask, sterile gowns, sterile gloves, sterile drape, hand hygiene and skin antiseptic. A timeout was performed prior to the initiation of the procedure. The patient was intubated by the anesthesia team in interventional radiology. The patient was supine on the image intensifier, with prepped and draped in the usual sterile fashion. Due to the complexity of the case, a second physician was required to be present during all steps of the case, Dr. Creasie Doctor. Ultrasound guidance was used to place a 25 gauge spinal needle into the right inguinal lymph node, positioning the needle tip at the interface of the cortex and hilum of the lymph node. Ultrasound guidance was used to place a 25 gauge spinal needle into the left inguinal lymph node, positioning the needle tip at the interface of cortex and hilum of the left node. We then initiated lymphangiogram with slow infusion of lipiodol with intermittent fluoroscopic observation. Opacification progressed faster from the right-side in the right-sided pelvic lymph system into the abdomen. Ultimately when the lymphatics of the upper abdomen and the thoracic duct were identified, we attempted to perform a fluoroscopic guided puncture of the thoracic duct. Majority of the anesthesia time and fluoroscopic time was invested with attempt to puncture the thoracic duct via a transabdominal approach. This was performed with a combination of 15 and 20 cm 21 gauge Chiba needles. Ultimately unsuccessful with cannulating the thoracic duct via transabdominal approach. Given that there was good opacification of the terminal thoracic duct at its confluence with the left subclavian vein, we elected to try retrograde approach. Ultrasound-guided access of the left basilic vein was performed. A 7 French short sheath was placed, and a Willi wire and 5  [place] cobra catheter was advanced retrograde. Using the fluoroscopic landmark of the thoracic duct terminus, we were able to cannulate the valve with a combination of a V18 wire and a Progreat microcatheter. The microcatheter and microwire were advanced retrograde through the thoracic duct. The 5 French catheter was advanced over the microcatheter and microwire combination. Once the 5 French catheter was in a downgoing position retrograde in the thoracic duct the microcatheter and microwire were removed. 035 Willi wire was placed into the thoracic duct. The 5 [place] base catheter was removed, 7 [place] short sheath was removed and a 6 French 45 cm straight tip to room 0 destination sheath was placed into the very terminus of the thoracic duct. The 5 [place] cobra catheter was advanced down the wholey wire, wholey was removed, and parallel the 18 wires were placed into the thoracic duct. The 5 French catheter was removed from the 6 [place] sheath and then parallel Progreat micro catheters were placed. One microcatheter was position distally in the lower thoracic duct at the T12 level, and the more proximal microcatheter was positioned at the level of the clavicular heads. Coil embolization with a series of Ruby low-profile microcoils was performed. N BCA glue embolization was then performed through the remainder of the  thoracic duct, from the T12 level to the more proximal coil pack. All micro catheters were then removed. Sheath was removed and manual pressure was used for hemostasis. Final images were acquired. Sterile bandage is placed at the puncture sites in both the abdomen and the bilateral inguinal region. Patient tolerated the procedure well and remained hemodynamically stable throughout. No complications were encountered and no significant blood loss. FINDINGS: Lymphangiogram demonstrates partial opacification of the thoracic duct. Images demonstrate retrograde cannulation of the thoracic duct at the  confluence to the subclavian vein. 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 Signed, Marciano Settles. Rexine Cater, RPVI [facility] Rad[facility] Special[facility] Radiology Electronically Signed   By: Myrlene Asper D.O.   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 ANESTHESIA/SEDATION: The anesthesia team was present to provide general endotracheal tube anesthesia and for patient monitoring during the procedure. Intubation was performed in INTERVENTIONAL RADIOLOGY. Interventional radiology nursing staff was also present. FLUOROSCOPY: Radiation Exposure Index (as provided by the fluoroscopic device): 1,184 mGy Kerma COMPLICATIONS: None TECHNIQUE: Informed written consent was obtained from the patient after a thorough discussion of the procedural risks, benefits and alternatives. All questions were addressed. Maximal Sterile Barrier Technique was utilized including caps, mask, sterile gowns, sterile gloves, sterile drape, hand hygiene and skin antiseptic. A timeout was performed prior to  the initiation of the procedure. The patient was intubated by the anesthesia team in interventional radiology. The patient was supine on the image intensifier, with prepped and draped in the usual sterile fashion. Due to the complexity of the case, a second physician was required to be present during all steps of the case, Dr. Creasie Doctor. Ultrasound guidance was used to place a 25 gauge spinal needle into the right inguinal lymph node, positioning the needle tip at the interface of the cortex and hilum of the lymph node. Ultrasound guidance was used to place a 25 gauge spinal needle into the left inguinal lymph node, positioning the needle tip at the interface of cortex and hilum of the left node. We then initiated lymphangiogram with slow infusion of lipiodol with intermittent fluoroscopic observation. Opacification progressed faster from the right-side in the right-sided pelvic lymph system into the abdomen. Ultimately when the lymphatics of the upper abdomen and the thoracic duct were identified, we attempted to perform a fluoroscopic guided puncture of the thoracic duct. Majority of the anesthesia time and fluoroscopic time was invested with attempt to puncture the thoracic duct via a transabdominal approach. This was performed with a combination of 15 and 20 cm 21 gauge Chiba needles. Ultimately unsuccessful with cannulating the thoracic duct via transabdominal approach. Given that there was good opacification of the terminal thoracic duct at its confluence with the left subclavian vein, we elected to try retrograde approach. Ultrasound-guided  access of the left basilic vein was performed. A 7 French short sheath was placed, and a Willi wire and 5 [place] cobra catheter was advanced retrograde. Using the fluoroscopic landmark of the thoracic duct terminus, we were able to cannulate the valve with a combination of a V18 wire and a Progreat microcatheter. The microcatheter and microwire were advanced retrograde  through the thoracic duct. The 5 French catheter was advanced over the microcatheter and microwire combination. Once the 5 French catheter was in a downgoing position retrograde in the thoracic duct the microcatheter and microwire were removed. 035 Willi wire was placed into the thoracic duct. The 5 [place] base catheter was removed, 7 [place] short sheath was removed and a 6 French 45 cm straight tip to room 0 destination sheath was placed into the very terminus of the thoracic duct. The 5 [place] cobra catheter was advanced down the wholey wire, wholey was removed, and parallel the 18 wires were placed into the thoracic duct. The 5 French catheter was removed from the 6 [place] sheath and then parallel Progreat micro catheters were placed. One microcatheter was position distally in the lower thoracic duct at the T12 level, and the more proximal microcatheter was positioned at the level of the clavicular heads. Coil embolization with a series of Ruby low-profile microcoils was performed. N BCA glue embolization was then performed through the remainder of the thoracic duct, from the T12 level to the more proximal coil pack. All micro catheters were then removed. Sheath was removed and manual pressure was used for hemostasis. Final images were acquired. Sterile bandage is placed at the puncture sites in both the abdomen and the bilateral inguinal region. Patient tolerated the procedure well and remained hemodynamically stable throughout. No complications were encountered and no significant blood loss. FINDINGS: Lymphangiogram demonstrates partial opacification of the thoracic duct. Images demonstrate retrograde cannulation of the thoracic duct at the confluence to the subclavian vein. 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 Signed, Marciano Settles. Rexine Cater, RPVI V[facility] and Interventional Radiology Specialists [facility] Radiology Electronically Signed   By: Myrlene Asper D.O.   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 ANESTHESIA/SEDATION: The anesthesia team was present to provide general endotracheal tube anesthesia and for patient monitoring during the procedure. Intubation was performed in INTERVENTIONAL RADIOLOGY. Interventional radiology nursing staff was also present. FLUOROSCOPY: Radiation Exposure Index (as provided by the fluoroscopic device): 1,184 mGy Kerma COMPLICATIONS: None TECHNIQUE: Informed written consent was obtained from the patient after a thorough discussion of the procedural risks, benefits and alternatives. All questions were addressed. Maximal Sterile Barrier Technique was utilized including caps, mask, sterile gowns, sterile gloves, sterile drape, hand hygiene and skin antiseptic. A timeout was performed prior to the initiation of the procedure. The patient was intubated  by the anesthesia team in interventional radiology. The patient was supine on the image intensifier, with prepped and draped in the usual sterile fashion. Due to the complexity of the case, a second physician was  required to be present during all steps of the case, Dr. Creasie Doctor. Ultrasound guidance was used to place a 25 gauge spinal needle into the right inguinal lymph node, positioning the needle tip at the interface of the cortex and hilum of the lymph node. Ultrasound guidance was used to place a 25 gauge spinal needle into the left inguinal lymph node, positioning the needle tip at the interface of cortex and hilum of the left node. We then initiated lymphangiogram with slow infusion of lipiodol with intermittent fluoroscopic observation. Opacification progressed faster from the right-side in the right-sided pelvic lymph system into the abdomen. Ultimately when the lymphatics of the upper abdomen and the thoracic duct were identified, we attempted to perform a fluoroscopic guided puncture of the thoracic duct. Majority of the anesthesia time and fluoroscopic time was invested with attempt to puncture the thoracic duct via a transabdominal approach. This was performed with a combination of 15 and 20 cm 21 gauge Chiba needles. Ultimately unsuccessful with cannulating the thoracic duct via transabdominal approach. Given that there was good opacification of the terminal thoracic duct at its confluence with the left subclavian vein, we elected to try retrograde approach. Ultrasound-guided access of the left basilic vein was performed. A 7 French short sheath was placed, and a Willi wire and 5 [place] cobra catheter was advanced retrograde. Using the fluoroscopic landmark of the thoracic duct terminus, we were able to cannulate the valve with a combination of a V18 wire and a Progreat microcatheter. The microcatheter and microwire were advanced retrograde through the thoracic duct. The 5 French catheter was advanced over the microcatheter and microwire combination. Once the 5 French catheter was in a downgoing position retrograde in the thoracic duct the microcatheter and microwire were removed. 035 Willi wire was placed  into the thoracic duct. The 5 [place] base catheter was removed, 7 [place] short sheath was removed and a 6 French 45 cm straight tip to room 0 destination sheath was placed into the very terminus of the thoracic duct. The 5 [place] cobra catheter was advanced down the wholey wire, wholey was removed, and parallel the 18 wires were placed into the thoracic duct. The 5 French catheter was removed from the 6 [place] sheath and then parallel Progreat micro catheters were placed. One microcatheter was position distally in the lower thoracic duct at the T12 level, and the more proximal microcatheter was positioned at the level of the clavicular heads. Coil embolization with a series of Ruby low-profile microcoils was performed. N BCA glue embolization was then performed through the remainder of the thoracic duct, from the T12 level to the more proximal coil pack. All micro catheters were then removed. Sheath was removed and manual pressure was used for hemostasis. Final images were acquired. Sterile bandage is placed at the puncture sites in both the abdomen and the bilateral inguinal region. Patient tolerated the procedure well and remained hemodynamically stable throughout. No complications were encountered and no significant blood loss. FINDINGS: Lymphangiogram demonstrates partial opacification of the thoracic duct. Images demonstrate retrograde cannulation of the thoracic duct at the confluence to the subclavian vein. 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 Signed, Marciano Settles. Rexine Cater, RPVI V[facility] and Interventional Radiology Specialists [facility] Radiology Electronically Signed   By:  Myrlene Asper D.O.   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 ANESTHESIA/SEDATION: The anesthesia team was present to provide general endotracheal tube anesthesia and for patient monitoring during the procedure. Intubation was performed in INTERVENTIONAL RADIOLOGY. Interventional radiology nursing staff was also present. FLUOROSCOPY: Radiation Exposure Index (as provided by the fluoroscopic device): 1,184 mGy Kerma COMPLICATIONS: None TECHNIQUE: Informed written consent was obtained from the patient after a thorough discussion of the procedural risks, benefits and alternatives. All questions were addressed. Maximal Sterile Barrier Technique was utilized including caps, mask, sterile gowns, sterile gloves, sterile drape, hand hygiene and skin antiseptic. A timeout was performed prior to the initiation of the procedure. The patient was intubated by the anesthesia team in interventional radiology. The patient was supine on the image intensifier, with prepped and draped in the usual sterile fashion. Due to the complexity of the case, a second physician was required to be present during all steps of the case, Dr. Creasie Doctor. Ultrasound guidance was used to place a 25 gauge spinal needle into the right inguinal lymph node, positioning the needle tip at the interface of the cortex and hilum of the lymph node. Ultrasound guidance was used to  place a 25 gauge spinal needle into the left inguinal lymph node, positioning the needle tip at the interface of cortex and hilum of the left node. We then initiated lymphangiogram with slow infusion of lipiodol with intermittent fluoroscopic observation. Opacification progressed faster from the right-side in the right-sided pelvic lymph system into the abdomen. Ultimately when the lymphatics of the upper abdomen and the thoracic duct were identified, we attempted to perform a fluoroscopic guided puncture of the thoracic duct. Majority of the anesthesia time and fluoroscopic time was invested with attempt to puncture the thoracic duct via a transabdominal approach. This was performed with a combination of 15 and 20 cm 21 gauge Chiba needles. Ultimately unsuccessful with cannulating the thoracic duct via transabdominal approach. Given that there was good opacification of the terminal thoracic duct at its confluence with the left subclavian vein, we elected to try retrograde approach. Ultrasound-guided access of the left basilic vein was performed. A 7 French short sheath was placed, and a Willi wire and 5 [place] cobra catheter was advanced retrograde. Using the fluoroscopic landmark of the thoracic duct terminus, we were able to cannulate the valve with a combination of a V18 wire and a Progreat microcatheter. The microcatheter and microwire were advanced retrograde through the thoracic duct. The 5 French catheter was advanced over the microcatheter and microwire combination. Once the 5 French catheter was in a downgoing position retrograde in the thoracic duct the microcatheter and microwire were removed. 035 Willi wire was placed into the thoracic duct. The 5 French base catheter was removed, 7 [place] short sheath was removed and a 6 [place]  45 cm straight tip to room 0 destination sheath was placed into the very terminus of the thoracic duct. The 5 [place] cobra catheter was advanced down the wholey wire, wholey was  removed, and parallel the 18 wires were placed into the thoracic duct. The 5 French catheter was removed from the 6 [place] sheath and then parallel Progreat micro catheters were placed. One microcatheter was position distally in the lower thoracic duct at the T12 level, and the more proximal microcatheter was positioned at the level of the clavicular heads. Coil embolization with a series of Ruby low-profile microcoils was performed. N BCA glue embolization was then performed through the remainder of the thoracic duct, from the T12 level to the more proximal coil pack. All micro catheters were then removed. Sheath was removed and manual pressure was used for hemostasis. Final images were acquired. Sterile bandage is placed at the puncture sites in both the abdomen and the bilateral inguinal region. Patient tolerated the procedure well and remained hemodynamically stable throughout. No complications were encountered and no significant blood loss. FINDINGS: Lymphangiogram demonstrates partial opacification of the thoracic duct. Images demonstrate retrograde cannulation of the thoracic duct at the confluence to the subclavian vein. 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 Signed, Marciano Settles. Rexine Cater, RPVI Vascular and Interventional Radiology Specialists [facility] Radiology Electronically Signed   By: Myrlene Asper D.O.   On: 09/12/2023 10:24   IR US  Guidance Result Date: 09/12/2023 INDICATION: 68 year old male with esophagectomy, chylous effusion, presents for lymphangiogram and possible thoracic duct embolization EXAM: ULTRASOUND-GUIDED NEEDLE PLACEMENT RIGHT INGUINAL LYMPH NODE  ULTRASOUND-GUIDED NEEDLE PLACEMENT LEFT INGUINAL LYMPH NODE LYMPHANGIOGRAM IMAGE GUIDED NEEDLE PLACEMENT THORACIC DUCT ULTRASOUND-GUIDED ACCESS LEFT BASILIC VEIN WITH VENOGRAM SUPER SELECTIVE CATHETER PLACEMENT VIA TRANS VENOUS APPROACH INTO THE THORACIC DUCT COIL AND GLUE EMBOLIZATION OF THORACIC DUCT COMPARISON:  MULTIPLE PRIOR CHEST X-RAY MEDICATIONS: None. ANESTHESIA/SEDATION: The anesthesia team was present to provide general endotracheal tube anesthesia and for patient monitoring during the procedure. Intubation was performed in INTERVENTIONAL RADIOLOGY. Interventional radiology nursing staff was also present. FLUOROSCOPY: Radiation Exposure Index (as provided by the fluoroscopic device): 1,184 mGy Kerma COMPLICATIONS: None TECHNIQUE: Informed written consent was obtained from the patient after a thorough discussion of the procedural risks, benefits and alternatives. All questions were addressed. Maximal Sterile Barrier Technique was utilized including caps, mask, sterile gowns, sterile gloves, sterile drape, hand hygiene and skin antiseptic. A timeout was performed prior to the initiation of the procedure. The patient was intubated by the anesthesia team in interventional radiology. The patient was supine on the image intensifier, with prepped and draped in the usual sterile fashion. Due to the complexity of the case, a second physician was required to be present during all steps of the case, Dr. Creasie Doctor. Ultrasound guidance was used to place a 25 gauge spinal needle into the right inguinal lymph node, positioning the needle tip at the interface of the cortex and hilum of the lymph node. Ultrasound guidance was used to place a 25 gauge spinal needle into the left inguinal lymph node, positioning the needle tip at the interface of cortex and hilum of the left node. We then initiated lymphangiogram with slow infusion of lipiodol with intermittent fluoroscopic observation. Opacification progressed  faster  from the right-side in the right-sided pelvic lymph system into the abdomen. Ultimately when the lymphatics of the upper abdomen and the thoracic duct were identified, we attempted to perform a fluoroscopic guided puncture of the thoracic duct. Majority of the anesthesia time and fluoroscopic time was invested with attempt to puncture the thoracic duct via a transabdominal approach. This was performed with a combination of 15 and 20 cm 21 gauge Chiba needles. Ultimately unsuccessful with cannulating the thoracic duct via transabdominal approach. Given that there was good opacification of the terminal thoracic duct at its confluence with the left subclavian vein, we elected to try retrograde approach. Ultrasound-guided access of the left basilic vein was performed. A 7 French short sheath was placed, and a Willi wire and 5 [place] cobra catheter was advanced retrograde. Using the fluoroscopic landmark of the thoracic duct terminus, we were able to cannulate the valve with a combination of a V18 wire and a Progreat microcatheter. The microcatheter and microwire were advanced retrograde through the thoracic duct. The 5 French catheter was advanced over the microcatheter and microwire combination. Once the 5 French catheter was in a downgoing position retrograde in the thoracic duct the microcatheter and microwire were removed. 035 Willi wire was placed into the thoracic duct. The 5 [place] base catheter was removed, 7 [place] short sheath was removed and a 6 French 45 cm straight tip to room 0 destination sheath was placed into the very terminus of the thoracic duct. The 5 [place] cobra catheter was advanced down the wholey wire, wholey was removed, and parallel the 18 wires were placed into the thoracic duct. The 5 French catheter was removed from the 6 [place] sheath and then parallel Progreat micro catheters were placed. One microcatheter was position distally in the lower thoracic duct at the T12 level, and the more  proximal microcatheter was positioned at the level of the clavicular heads. Coil embolization with a series of Ruby low-profile microcoils was performed. N BCA glue embolization was then performed through the remainder of the thoracic duct, from the T12 level to the more proximal coil pack. All micro catheters were then removed. Sheath was removed and manual pressure was used for hemostasis. Final images were acquired. Sterile bandage is placed at the puncture sites in both the abdomen and the bilateral inguinal region. Patient tolerated the procedure well and remained hemodynamically stable throughout. No complications were encountered and no significant blood loss. FINDINGS: Lymphangiogram demonstrates partial opacification of the thoracic duct. Images demonstrate retrograde cannulation of the thoracic duct at the confluence to the subclavian vein. 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 Signed, Marciano Settles. Rexine Cater, RPVI Vascular and Interventional Radiology Specialists [facility] Radiology Electronically Signed   By: Myrlene Asper D.O.   On: 09/12/2023 10:24   IR EMBO ART  VEN HEMORR LYMPH EXTRAV  INC GUIDE ROADMAPPING Result Date: 09/12/2023 INDICATION: 68 year old male with esophagectomy, chylous effusion, presents for lymphangiogram and possible thoracic duct embolization EXAM: ULTRASOUND-GUIDED NEEDLE PLACEMENT RIGHT INGUINAL LYMPH NODE ULTRASOUND-GUIDED NEEDLE PLACEMENT LEFT INGUINAL LYMPH NODE LYMPHANGIOGRAM IMAGE GUIDED NEEDLE PLACEMENT THORACIC DUCT ULTRASOUND-GUIDED ACCESS LEFT BASILIC VEIN WITH VENOGRAM SUPER SELECTIVE CATHETER PLACEMENT VIA TRANS VENOUS APPROACH INTO  THE THORACIC DUCT COIL AND GLUE EMBOLIZATION  OF THORACIC DUCT COMPARISON:  MULTIPLE PRIOR CHEST X-RAY MEDICATIONS: None. ANESTHESIA/SEDATION: The anesthesia team was present to provide general endotracheal tube anesthesia and for patient monitoring during the procedure. Intubation was performed in INTERVENTIONAL RADIOLOGY. Interventional radiology nursing staff was also present. FLUOROSCOPY: Radiation Exposure Index (as provided by the fluoroscopic device): 1,184 mGy Kerma COMPLICATIONS: None TECHNIQUE: Informed written consent was obtained from the patient after a thorough discussion of the procedural risks, benefits and alternatives. All questions were addressed. Maximal Sterile Barrier Technique was utilized including caps, mask, sterile gowns, sterile gloves, sterile drape, hand hygiene and skin antiseptic. A timeout was performed prior to the initiation of the procedure. The patient was intubated by the anesthesia team in interventional radiology. The patient was supine on the image intensifier, with prepped and draped in the usual sterile fashion. Due to the complexity of the case, a second physician was required to be present during all steps of the case, Dr. Creasie Doctor. Ultrasound guidance was used to place a 25 gauge spinal needle into the right inguinal lymph node, positioning the needle tip at the interface of the cortex and hilum of the lymph node. Ultrasound guidance was used to place a 25 gauge spinal needle into the left inguinal lymph node, positioning the needle tip at the interface of cortex and hilum of the left node. We then initiated lymphangiogram with slow infusion of lipiodol with intermittent fluoroscopic observation. Opacification progressed faster from the right-side in the right-sided pelvic lymph system into the abdomen. Ultimately when the lymphatics of the upper abdomen and the thoracic duct were identified, we attempted to perform a fluoroscopic guided puncture of the thoracic duct.  Majority of the anesthesia time and fluoroscopic time was invested with attempt to puncture the thoracic duct via a transabdominal approach. This was performed with a combination of 15 and 20 cm 21 gauge Chiba needles. Ultimately unsuccessful with cannulating the thoracic duct via transabdominal approach. Given that there was good opacification of the terminal thoracic duct at its confluence with the left subclavian vein, we elected to try retrograde approach. Ultrasound-guided access of the left basilic vein was performed. A 7 French short sheath was placed, and a Willi wire and 5 [place] cobra catheter was advanced retrograde. Using the fluoroscopic landmark of the thoracic duct terminus, we were able to cannulate the valve with a combination of a V18 wire and a Progreat microcatheter. The microcatheter and microwire were advanced retrograde through the thoracic duct. The 5 French catheter was advanced over the microcatheter and microwire combination. Once the 5 French catheter was in a downgoing position retrograde in the thoracic duct the microcatheter and microwire were removed. 035 Willi wire was placed into the thoracic duct. The 5 [place] base catheter was removed, 7 [place] short sheath was removed and a 6 French 45 cm straight tip to room 0 destination sheath was placed into the very terminus of the thoracic duct. The 5 [place] cobra catheter was advanced down the wholey wire, wholey was removed, and parallel the 18 wires were placed into the thoracic duct. The 5 French catheter was removed from the 6 [place] sheath and then parallel Progreat micro catheters were placed. One microcatheter was position distally in the lower thoracic duct at the T12 level, and the more proximal microcatheter was positioned at the level of the clavicular heads. Coil embolization with a series of Ruby low-profile microcoils was performed. N BCA glue embolization was then performed through the remainder of the thoracic duct, from  the  T12 level to the more proximal coil pack. All micro catheters were then removed. Sheath was removed and manual pressure was used for hemostasis. Final images were acquired. Sterile bandage is placed at the puncture sites in both the abdomen and the bilateral inguinal region. Patient tolerated the procedure well and remained hemodynamically stable throughout. No complications were encountered and no significant blood loss. FINDINGS: Lymphangiogram demonstrates partial opacification of the thoracic duct. Images demonstrate retrograde cannulation of the thoracic duct at the confluence to the subclavian vein. 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 Signed, Marciano Settles. Rexine Cater, RPVI Vascular and Interventional Radiology Specialists [facility] Radiology Electronically Signed   By: Myrlene Asper D.O.   On: 09/12/2023 10:24    Assessment/Plan: S/P Procedure(s) (LRB): RADIOLOGY WITH ANESTHESIA (N/A)  1 Afeb, VSS, sinus rhythm/ST 2 sats ok on RA 3 CT 260 ml/24 h recorded, serous 4 no new CXR or labs  5 conts TF, current diet 6 BS control variable 109- 283 range yesterday, mostly in reasonable range 7 conts rehab, likely home when chest tube removed  LOS: 25 days    Lindi Revering PA-C Pager [phone] 09/13/2023  Agree Will transition back to jevity Dispo planning  [employer]

## 2023-09-13 NOTE — Plan of Care (Signed)
  Problem: Education: Goal: Ability to describe self-care measures that may prevent or decrease complications (Diabetes Survival Skills Education) will improve Outcome: Progressing   Problem: Coping: Goal: Ability to adjust to condition or change in health will improve Outcome: Progressing   Problem: Fluid Volume: Goal: Ability to maintain a balanced intake and output will improve Outcome: Progressing   Problem: Metabolic: Goal: Ability to maintain appropriate glucose levels will improve Outcome: Progressing   Problem: Nutritional: Goal: Maintenance of adequate nutrition will improve Outcome: Progressing   Problem: Skin Integrity: Goal: Risk for impaired skin integrity will decrease Outcome: Progressing   Problem: Tissue Perfusion: Goal: Adequacy of tissue perfusion will improve Outcome: Progressing   Problem: Education: Goal: Knowledge of General Education information will improve Description: Including pain rating scale, medication(s)/side effects and non-pharmacologic comfort measures Outcome: Progressing   Problem: Clinical Measurements: Goal: Ability to maintain clinical measurements within normal limits will improve Outcome: Progressing Goal: Respiratory complications will improve Outcome: Progressing Goal: Cardiovascular complication will be avoided Outcome: Progressing   Problem: Nutrition: Goal: Adequate nutrition will be maintained Outcome: Progressing   Problem: Elimination: Goal: Will not experience complications related to bowel motility Outcome: Progressing Goal: Will not experience complications related to urinary retention Outcome: Progressing

## 2023-09-14 ENCOUNTER — Other Ambulatory Visit: Payer: Self-pay

## 2023-09-14 LAB — GLUCOSE, CAPILLARY
Glucose-Capillary: 114 mg/dL — ABNORMAL HIGH (ref 70–99)
Glucose-Capillary: 129 mg/dL — ABNORMAL HIGH (ref 70–99)
Glucose-Capillary: 149 mg/dL — ABNORMAL HIGH (ref 70–99)
Glucose-Capillary: 162 mg/dL — ABNORMAL HIGH (ref 70–99)
Glucose-Capillary: 168 mg/dL — ABNORMAL HIGH (ref 70–99)
Glucose-Capillary: 182 mg/dL — ABNORMAL HIGH (ref 70–99)

## 2023-09-14 MED ORDER — VIVONEX RTF PO LIQD
1000.0000 mL | ORAL | Status: DC
  Administered 2023-09-14 – 2023-09-15 (×2): 1000 mL
  Filled 2023-09-14 (×3): qty 1000

## 2023-09-14 MED ORDER — MEDIUM CHAIN TRIGLYCERIDES PO OIL
15.0000 mL | TOPICAL_OIL | Freq: Three times a day (TID) | ORAL | Status: DC
  Administered 2023-09-14 – 2023-09-15 (×2): 15 mL
  Filled 2023-09-14 (×5): qty 15

## 2023-09-14 NOTE — Progress Notes (Signed)
 Mobility Specialist Progress Note:   09/14/23 1207  Mobility  Activity Ambulated with assistance in hallway  Level of Assistance Standby assist, set-up cues, supervision of patient - no hands on  Assistive Device Front wheel walker  Distance Ambulated (ft) 450 ft  Activity Response Tolerated well  Mobility Referral Yes  Mobility visit 1 Mobility  Mobility Specialist Start Time (ACUTE ONLY) 1000  Mobility Specialist Stop Time (ACUTE ONLY) 1020  Mobility Specialist Time Calculation (min) (ACUTE ONLY) 20 min   Pre Mobility:  94% SpO2 RA During Mobility: 100 HR , 92% SpO2 RA Post Mobility:  90% SpO2 RA  Pt received in bed, agreeable to mobility. C/o fatigue and SOB prior to ambulation. VSS on RA. No physical assist required during session. Pt was able to ambulate in hallway with SV. 1x standing rest break d/t fatigue, otherwise asx throughout. Pt returned to bed with call bell in reach and all needs met.   Sofia Dunn  Mobility Specialist Please contact via [employer] office at [phone]

## 2023-09-14 NOTE — Progress Notes (Signed)
 RN noted drainage from chest tube site. No signs of air leaks in chest tube, sutures secure and in place. No signs of chest tube dislodgement, no respiratory distress. Gauze dressings around chest tube site changed. Will continue to monitor closely.

## 2023-09-14 NOTE — Progress Notes (Addendum)
 3 Days Post-Op Procedure(s) (LRB): RADIOLOGY WITH ANESTHESIA (N/A) Subjective: Having some cough today  Objective: Vital signs in last 24 hours: Temp:  [97.9 F (36.6 C)-98.8 F (37.1 C)] 98.6 F (37 C) (04/27 0357) Pulse Rate:  [95-100] 100 (04/27 0825) Cardiac Rhythm: Normal sinus rhythm (04/26 1900) Resp:  [18-31] 25 (04/27 0825) BP: (120-133)/(66-70) 133/69 (04/27 0825) SpO2:  [91 %-95 %] 95 % (04/27 0825) Weight:  [77.8 kg] 77.8 kg (04/27 0300)  Hemodynamic parameters for last 24 hours:    Intake/Output from previous day: 04/26 0701 - 04/27 0700 In: 3227.3 [P.O.:480; NG/GT:2747.3] Out: 325 [Urine:175; Chest Tube:150] Intake/Output this shift: No intake/output data recorded.  General appearance: alert, cooperative, and no distress Heart: regular rate and rhythm Lungs: clear to auscultation bilaterally Abdomen: benign Extremities: no edema or calf tenderness Wound: incis ok,  wounds ok, some tube feed drainage   Lab Results: No results for input(s): "WBC", "HGB", "HCT", "PLT" in the last 72 hours. BMET: No results for input(s): "NA", "K", "CL", "CO2", "GLUCOSE", "BUN", "CREATININE", "CALCIUM " in the last 72 hours.  PT/INR: No results for input(s): "LABPROT", "INR" in the last 72 hours. ABG    Component Value Date/Time   PHART 7.4 07/29/2023 0514   HCO3 25.8 08/02/2023 2006   TCO2 17 (L) 08/19/2023 2022   ACIDBASEDEF 1.0 08/02/2023 1506   O2SAT 94.5 08/02/2023 2006   CBG (last 3)  Recent Labs    09/14/23 0019 09/14/23 0400 09/14/23 0825  GLUCAP 168* 162* 182*    Meds Scheduled Meds:  amLODipine   10 mg Oral Daily   atorvastatin   20 mg Oral Daily   Chlorhexidine  Gluconate Cloth  6 each Topical Daily   enoxaparin  (LOVENOX ) injection  40 mg Subcutaneous Q24H   feeding supplement (PROSource TF20)  60 mL Per Tube BID   fiber  1 packet Per Tube BID   Gerhardt's butt cream  1 Application Topical TID   insulin  aspart  0-20 Units Subcutaneous Q4H   insulin   aspart  3 Units Subcutaneous Q4H   insulin  glargine-yfgn  14 Units Subcutaneous Daily   liver oil-zinc  oxide   Topical TID   medium chain triglycerides   15 mL Oral TID   metoprolol  tartrate  25 mg Oral BID   multivitamin with minerals  1 tablet Oral Daily   octreotide   100 mcg Subcutaneous TID   mouth rinse  15 mL Mouth Rinse 4 times per day   pantoprazole   40 mg Oral Daily   saccharomyces boulardii  250 mg Oral BID   sodium chloride  flush  10-40 mL Intracatheter Q12H   tamsulosin   0.4 mg Oral Daily   thiamine   100 mg Oral Daily   Continuous Infusions:  Vivonex RTF 1,000 mL (09/14/23 0843)   PRN Meds:.acetaminophen  (TYLENOL ) oral liquid 160 mg/5 mL, HYDROmorphone  (DILAUDID ) injection, lip balm, ondansetron  (ZOFRAN ) IV, mouth rinse, oxyCODONE , polyvinyl alcohol , sodium chloride  flush  Xrays No results found.  Assessment/Plan: S/P Procedure(s) (LRB): RADIOLOGY WITH ANESTHESIA (N/A)  1 afeb, VSS, sinus rhythm 2 sats good on RA 3 weight stable 4 voiding well. + stools- loose, from TF's 5 CT 150 ml/24 h- more milky in appearance today, to be kept in place 6 nutrition consulted to change TF's but a little concerned about drainage appearance today as was serous yesterday and milkier today 7 con pulm hygiene and rehab    LOS: 26 days    Lindi Revering PA-C Pager [phone] 09/14/2023   Agree with above Dispo planning  Asees Manfredi Ala [place]

## 2023-09-15 ENCOUNTER — Inpatient Hospital Stay (HOSPITAL_COMMUNITY)

## 2023-09-15 ENCOUNTER — Encounter: Payer: Self-pay | Admitting: Oncology

## 2023-09-15 ENCOUNTER — Other Ambulatory Visit (HOSPITAL_COMMUNITY): Payer: Self-pay

## 2023-09-15 DIAGNOSIS — D638 Anemia in other chronic diseases classified elsewhere: Secondary | ICD-10-CM

## 2023-09-15 DIAGNOSIS — J940 Chylous effusion: Secondary | ICD-10-CM

## 2023-09-15 LAB — GLUCOSE, CAPILLARY
Glucose-Capillary: 166 mg/dL — ABNORMAL HIGH (ref 70–99)
Glucose-Capillary: 167 mg/dL — ABNORMAL HIGH (ref 70–99)
Glucose-Capillary: 196 mg/dL — ABNORMAL HIGH (ref 70–99)
Glucose-Capillary: 248 mg/dL — ABNORMAL HIGH (ref 70–99)

## 2023-09-15 LAB — CBC
HCT: 22.7 % — ABNORMAL LOW (ref 39.0–52.0)
Hemoglobin: 7.3 g/dL — ABNORMAL LOW (ref 13.0–17.0)
MCH: 28.1 pg (ref 26.0–34.0)
MCHC: 32.2 g/dL (ref 30.0–36.0)
MCV: 87.3 fL (ref 80.0–100.0)
Platelets: 368 10*3/uL (ref 150–400)
RBC: 2.6 MIL/uL — ABNORMAL LOW (ref 4.22–5.81)
RDW: 15.7 % — ABNORMAL HIGH (ref 11.5–15.5)
WBC: 12.6 10*3/uL — ABNORMAL HIGH (ref 4.0–10.5)
nRBC: 0 % (ref 0.0–0.2)

## 2023-09-15 LAB — BASIC METABOLIC PANEL WITH GFR
Anion gap: 10 (ref 5–15)
BUN: 29 mg/dL — ABNORMAL HIGH (ref 8–23)
CO2: 22 mmol/L (ref 22–32)
Calcium: 7.5 mg/dL — ABNORMAL LOW (ref 8.9–10.3)
Chloride: 105 mmol/L (ref 98–111)
Creatinine, Ser: 1.28 mg/dL — ABNORMAL HIGH (ref 0.61–1.24)
GFR, Estimated: >60 mL/min (ref >60)
Glucose, Bld: 199 mg/dL — ABNORMAL HIGH (ref 70–99)
Potassium: 3.4 mmol/L — ABNORMAL LOW (ref 3.5–5.1)
Sodium: 137 mmol/L (ref 135–145)

## 2023-09-15 MED ORDER — SACCHAROMYCES BOULARDII 250 MG PO CAPS
250.0000 mg | ORAL_CAPSULE | Freq: Two times a day (BID) | ORAL | 1 refills | Status: DC
  Filled 2023-09-15: qty 60, 30d supply, fill #0

## 2023-09-15 MED ORDER — ADULT MULTIVITAMIN W/MINERALS CH: 1.0000 | ORAL_TABLET | Freq: Every day | ORAL | Status: DC

## 2023-09-15 MED ORDER — METOPROLOL TARTRATE 25 MG PO TABS: 25.0000 mg | ORAL_TABLET | Freq: Two times a day (BID) | ORAL | Status: DC

## 2023-09-15 MED ORDER — VIVONEX RTF PO LIQD: 1000.0000 mL | ORAL | Status: DC

## 2023-09-15 MED ORDER — THIAMINE HCL 100 MG PO TABS
100.0000 mg | ORAL_TABLET | Freq: Every day | ORAL | 1 refills | Status: DC
  Filled 2023-09-15: qty 30, 30d supply, fill #0

## 2023-09-15 MED ORDER — POTASSIUM CHLORIDE 20 MEQ PO PACK: 40.0000 meq | PACK | Freq: Once | ORAL | Status: DC

## 2023-09-15 MED ORDER — NUTRISOURCE FIBER PO PACK
1.0000 | PACK | Freq: Two times a day (BID) | ORAL | 1 refills | Status: DC
  Filled 2023-09-15: qty 75, 38d supply, fill #0

## 2023-09-15 MED ORDER — MEDIUM CHAIN TRIGLYCERIDES PO OIL
15.0000 mL | TOPICAL_OIL | Freq: Three times a day (TID) | ORAL | 0 refills | Status: DC
  Filled 2023-09-15: qty 21, 1d supply, fill #0

## 2023-09-15 MED ORDER — LANTUS SOLOSTAR 100 UNIT/ML ~~LOC~~ SOPN: 14.0000 [IU] | PEN_INJECTOR | Freq: Every day | SUBCUTANEOUS | Status: DC

## 2023-09-15 NOTE — Discharge Instructions (Addendum)
 Discharge Instructions:  Please record chest tube output daily  Continue Tube Feedings at 65/ml hour continuous  Oral intake is Dysphagia 1 diet... do not advance until cleared by Dr. Deloise Ferries  Activity- be up and ambulating 3x per day  You may sponge bath, do not shower.... change soiled dressings as needed  J Tube- Flush every 4 hours with 30 ml of Sterile water , also flush before and after medication administration, and when tube feedings are stopped  6. No driving until cleared by Dr. Barney Libman office  7. If you develop fever of 101.5 for >24 hours, contact office immediately at [phone]

## 2023-09-15 NOTE — Progress Notes (Signed)
 RA DL PICC removed per protocol per MD order. Manual pressure applied for 3 mins. Vaseline gauze, gauze, and Tegaderm applied over insertion site. No bleeding or swelling noted. Instructed patient to remain in bed for thirty mins. Educated patient and family members about S/S of infection and when to call MD; no heavy lifting or pressure on right side for 24 hours; keep dressing dry and intact for 24 hours. Pt verbalized comprehension.

## 2023-09-15 NOTE — Progress Notes (Addendum)
      [address]       [name] [address]             [phone]      4 Days Post-Op Procedure(s) (LRB): RADIOLOGY WITH ANESTHESIA (N/A)  Subjective:  He is tired of being here.  States chest tube is draining quite a bit.. [employer] stopped working over the weekend.  He continues to have one episode of diarrhea daily.  Objective: Vital signs in last 24 hours: Temp:  [98.1 F (36.7 C)-99 F (37.2 C)] 98.3 F (36.8 C) (04/28 0430) Pulse Rate:  [94-110] 98 (04/28 0430) Cardiac Rhythm: Sinus tachycardia (04/27 1900) Resp:  [20-29] 29 (04/28 0430) BP: (121-137)/(69-80) 126/73 (04/28 0430) SpO2:  [93 %-94 %] 93 % (04/28 0430) Weight:  [76.1 kg] 76.1 kg (04/28 0430)  Intake/Output from previous day: 04/27 0701 - 04/28 0700 In: 1365 [NG/GT:1365] Out: 780 [Blood:700; Chest Tube:80]  General appearance: alert, cooperative, and no distress Heart: regular rate and rhythm Lungs: clear to auscultation bilaterally Abdomen: soft, non-tender, non-distended.. J tube has mild greenish drainage from around J tube.. unchanged Extremities: extremities normal, atraumatic, no cyanosis or edema Wound: extensive drainage along right chest..  Lab Results: Recent Labs    09/15/23 0800  WBC 12.6*  HGB 7.3*  HCT 22.7*  PLT 368       Component Value Date/Time   PHART 7.4 07/29/2023 0514   HCO3 25.8 08/02/2023 2006   TCO2 17 (L) 08/19/2023 2022   ACIDBASEDEF 1.0 08/02/2023 1506   O2SAT 94.5 08/02/2023 2006   CBG (last 3)  Recent Labs    09/15/23 0035 09/15/23 0433 09/15/23 0809  GLUCAP 167* 166* 196*    Assessment/Plan: S/P Procedure(s) (LRB): RADIOLOGY WITH ANESTHESIA (N/A)  CV- NSR, BP has remained well controlled-Lopressor , Norvasc  Chylothorax- s/p coil embolization, glue.[name] output is slowing down and is more serous.. CXR with right basilar opacity Renal- H/O CKD Stage 3, creatinine has been stable, down to 1.28 GI- tube feedings at 65 ml/hr.... oral  intake remains poor...on dysphagia 1 diet.[name] no plans to advance at this time ID- afebrile, + leukcytosis of 12.6, extensive drainage from around tube site... surgical incision is healed Anemia of chronic disease... Hgb at 7.3, was transfused and had been stable at 8 DM- sugars are well controlled  Per Dr. Deloise Ferries will discharge patient today, with follow up Friday   LOS: 27 days   [name], PA-C 09/15/2023

## 2023-09-15 NOTE — TOC Transition Note (Signed)
 Transition of Care [facility]) - Discharge Note Sherin Dingwall RN, BSN Transitions of Care Unit 4E- RN Case Manager See Treatment Team for direct phone #   Patient Details  Name: Seth Burch MRN: [id number] Date of Birth: 1955-07-15  Transition of Care [facility]) CM/SW Contact:  Rox Cope, RN Phone Number: 09/15/2023, 12:04 PM   Clinical Narrative:    Per MD pt stable for return home, Per previous CM note HH was set up with [facility], and pt home TF needs with [facility].   Call made to [facility] liaison, confirmed that [facility] can still follow for home health needs- new orders placed for RN/PT. Liaison to follow up to schedule restart of care.   Pt will still have home TF- per [facility]- no change in TF needs for home. Pt has TF supplies from initial shipment. Call made to [facility] liaison- Mitch to confirm home TF supplies have been delivered- last shipment processed on 4/25 for delivery this week.   Wife to transport home.  No further TOC needs noted.   Final next level of care: Home w Home Health Services Barriers to Discharge: Barriers Resolved   Patient Goals and CMS Choice Patient states their goals for this hospitalization and ongoing recovery are:: wants to return home and remain independent CMS Medicare.gov Compare Post Acute Care list provided to:: Patient Represenative (must comment) Choice offered to / list presented to : Spouse      Discharge Placement               Home w/ [facility]        Discharge Plan and Services Additional resources added to the After Visit Summary for     Discharge Planning Services: CM Consult Post Acute Care Choice: Home Health, Resumption of Svcs/PTA Provider          DME Arranged: N/A DME Agency: NA       HH Arranged: RN, PT HH Agency: [facility] Date HH Agency Contacted: 09/15/23 Time HH Agency Contacted: [phone] Representative spoke with at [facility] Agency: Amy  Social Drivers of Health (SDOH) Interventions SDOH Screenings   Food  Insecurity: No Food Insecurity (08/21/2023)  Housing: Low Risk  (08/21/2023)  Transportation Needs: No Transportation Needs (08/21/2023)  Utilities: Not At Risk (08/21/2023)  Depression (PHQ2-9): Low Risk  (06/12/2023)  Social Connections: Moderately Integrated (08/21/2023)  Tobacco Use: Low Risk  (09/11/2023)     Readmission Risk Interventions    09/15/2023   12:04 PM  Readmission Risk Prevention Plan  Transportation Screening Complete  PCP or Specialist Appt within 5-7 Days Complete  Home Care Screening Complete  Medication Review (RN CM) Complete

## 2023-09-15 NOTE — Plan of Care (Signed)
  Problem: Education: Goal: Ability to describe self-care measures that may prevent or decrease complications (Diabetes Survival Skills Education) will improve Outcome: Progressing   Problem: Coping: Goal: Ability to adjust to condition or change in health will improve Outcome: Not Progressing

## 2023-09-15 NOTE — Progress Notes (Signed)
 Occupational Therapy Treatment Patient Details Name: Seth Burch MRN: [id number] DOB: 06/09/1955 Today's Date: 09/15/2023   History of present illness Pt is a 68 y/o male presenting 4/1 due to acute onset of R chest pain after eating. Pt noted with AKI. 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 4/2 R VATS, EGD, esophageal stent placement, and chest tube placement. 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 PMHx: HTN, DM   OT comments  Patient scheduled for discharge this date.  Patient has progressed nicely during his hospitalization.  OT with brief discussion on any needs for home environment.  It appears all DME has been delivered to the home.  Patient is quite anxious to return home.  He will need to go home with J-tube and chest tube.  Patient will have assist as needed at home, and hopefully he will do well.  No further OT needs in the acute setting given discharge.  Patient comfortable and has no needs, or questions.  Discontinue OT.  No post acute OT needs given family support.        If plan is discharge home, recommend the following:  Assistance with cooking/housework;Assist for transportation;Help with stairs or ramp for entrance   Equipment Recommendations  None recommended by OT    Recommendations for Other Services      Precautions / Restrictions Precautions Precautions: Other (comment) Recall of Precautions/Restrictions: Intact Precaution/Restrictions Comments: jejunostomy, R chest tube, wound vac Restrictions Weight Bearing Restrictions Per Provider Order: No       Mobility Bed Mobility Overal bed mobility: Modified Independent                  Transfers Overall transfer level: Needs assistance Equipment used: Rolling walker (2 wheels)   Sit  to Stand: Supervision     Step pivot transfers: Supervision     General transfer comment: supervision for safety/lines     Balance Overall balance assessment: Mild deficits observed, not formally tested                                         ADL either performed or assessed with clinical judgement   ADL       Grooming: Standing;Wash/dry hands;Supervision/safety   Upper Body Bathing: Set up;Sitting   Lower Body Bathing: Sit to/from stand;Sitting/lateral leans;Supervison/ safety   Upper Body Dressing : Set up;Sitting   Lower Body Dressing: Set up;Sitting/lateral leans;Sit to/from stand;Supervision/safety   Toilet Transfer: Supervision/safety;Rolling walker (2 wheels);Regular Toilet;Ambulation                  Extremity/Trunk Assessment Upper Extremity Assessment Upper Extremity Assessment: Overall WFL for tasks assessed   Lower Extremity Assessment Lower Extremity Assessment: Defer to PT evaluation   Cervical / Trunk Assessment Cervical / Trunk Assessment: Normal    Vision Patient Visual Report: No change from baseline     Perception Perception Perception: Not tested   Praxis Praxis Praxis: Not tested   Communication Communication Communication: No apparent difficulties   Cognition Arousal: Alert Behavior During Therapy: WFL for tasks assessed/performed Cognition: No apparent impairments  Following commands: Intact        Cueing   Cueing Techniques: Verbal cues  Exercises      Shoulder Instructions       General Comments      Pertinent Vitals/ Pain       Pain Assessment Pain Assessment: No/denies pain Pain Intervention(s): Monitored during session                                                          Frequency  Min 2X/week        Progress Toward Goals  OT Goals(current goals can now be found in the care plan section)  Progress towards  OT goals: Goals met/education completed, patient discharged from OT  Acute Rehab OT Goals OT Goal Formulation: With patient Time For Goal Achievement: 09/16/23 Potential to Achieve Goals: Good  Plan      Co-evaluation                 AM-PAC OT "6 Clicks" Daily Activity     Outcome Measure   Help from another person eating meals?: None Help from another person taking care of personal grooming?: None Help from another person toileting, which includes using toliet, bedpan, or urinal?: A Little Help from another person bathing (including washing, rinsing, drying)?: A Little Help from another person to put on and taking off regular upper body clothing?: None Help from another person to put on and taking off regular lower body clothing?: A Little 6 Click Score: 21    End of Session    OT Visit Diagnosis: Muscle weakness (generalized) (M62.81);Unsteadiness on feet (R26.81)   Activity Tolerance Patient tolerated treatment well   Patient Left in bed;with call bell/phone within reach   Nurse Communication Mobility status        Time: 1610-9604 OT Time Calculation (min): 10 min  Charges: OT General Charges $OT Visit: 1 Visit OT Treatments $Self Care/Home Management : 8-22 mins  09/15/2023  RP, OTR/L  Acute Rehabilitation Services  Office:  [phone]   Seth Burch 09/15/2023, 9:46 AM

## 2023-09-15 NOTE — Progress Notes (Addendum)
 Discharge instructions reviewed with pt, his wife and daughter.  Copy of instructions given to pt. [facility] has filled pt scripts and will be picked up on the way out for discharge. Pt's chest tube was changed over to a "mini press" system by unit CN/AD.  Reached out to Erin,PA about CXR, it still needs to be read by the radiologist, PA stated she will reach out to pt/family if pt needs an antibiotic, pt and family informed of this .  Pt will be d/c'd via wheelchair with belongings, with family and will be       escorted by hospital volunteer.    Fleta Human, RN SWOT    At 1341---pt/family asked if dressings to chest tube site and Jtube tube could be changed prior to leaving. Dressing changed to chest tube--noted white/yellow drainage on dressing removed, cleaned area and patted dry, skin around chest tube drain pink, incisions in the area pink, new 4x4 gauze folded applied under and above tubing and secured with hypafix tape. Pt/family states Dr Deloise Ferries changed the dressing this am.   J tube dressing changed, cleaned around the site, noted green drainage on the dressing removed and on the skin, periskin noted to have desitin and Gerhardt's cream on it, noted pinkness beneath the creams--more Gerhardt's cream applied, split gauze applied (pt/family state skin area around tube has improved, pt states it feels better).   Pt d/c'd at this time 1347.   Agata Lucente,RN SWOT

## 2023-09-15 NOTE — Progress Notes (Addendum)
 Nutrition Brief Note  Pt discharging today. Stopped by his room, where daughter and wife were at bedside. Having PICC line removed. Pt and family pleased to be going home. Fielded questions and concerns at bedside.   Received secure message from RN Saturday stating patient and family do not prefer to switch back to [facility] formula for discharge as they have Vivonex at home already. Discussed that Vivonex will only be meeting approximately 80% of his estimated calorie and protein needs and that patient will need to make concerted effort to consume adequate calories and protein to bridge the gap to continue to continue to improve.  Daughter asking if goal rate is still 105ml/hr. Recommended advancing to 45ml/hr, if able to better meet needs. Goal continues to be adequately meeting nutrition needs to promote progress with therapy, weight gain, and improvement in nutrition status.   Wt Readings from Last 15 Encounters:  09/15/23 76.1 kg  08/19/23 77.4 kg  07/24/23 82.1 kg  07/18/23 83 kg  07/11/23 83 kg  07/03/23 80.2 kg  06/30/23 79.4 kg  06/12/23 82.6 kg  05/30/23 80.6 kg  05/22/23 81 kg  05/16/23 82.1 kg  05/13/23 80.9 kg  05/09/23 82.6 kg  04/25/23 82.1 kg  04/25/23 81.9 kg    INTERVENTION:  TF infusing via J-tube: Vivonex at 65ml/hr (1560ml per day)   Providing 1640kcal (75% minimum estimated needs), 98g protein (85% of minimum estimated needs) and per day   Recommend advancing TF back to goal as tolerated, per MD: Goal TF regimen- Vivonex at 70ml/hr (1680 ml per day)   TF at goal provides 1760 kcal (80% estimated needs), 104g protein (90% estimated needs), 1424 ml free water  daily   Continue to encourage PO intake to meet calorie and protein deficit   Continue MVI w/ minerals  NUTRITION DIAGNOSIS:  Severe Malnutrition related to chronic illness (esophageal cancer s/p chemo, radiation and surgery) as evidenced by severe fat depletion, severe muscle depletion,  percent weight loss. - remains applicable   GOAL:  Patient will meet greater than or equal to 90% of their needs - progressing   MONITOR:  PO intake, Diet advancement, TF tolerance   Con Decant MS, RD, LDN Registered Dietitian Clinical Nutrition RD Inpatient Contact Info in Amion

## 2023-09-17 ENCOUNTER — Other Ambulatory Visit: Payer: Self-pay | Admitting: Oncology

## 2023-09-17 ENCOUNTER — Other Ambulatory Visit: Payer: Self-pay

## 2023-09-17 ENCOUNTER — Telehealth: Payer: Self-pay | Admitting: *Deleted

## 2023-09-17 DIAGNOSIS — C155 Malignant neoplasm of lower third of esophagus: Secondary | ICD-10-CM

## 2023-09-17 NOTE — Telephone Encounter (Signed)
 Patient contacted the office c/o a severe cough and leakage from incisions. Per patient cough produces a food like aroma. States he is on continuous tube feeds and recalls being told to stop feedings if episodes such as this happens. Tube feeds are currently turned off. States he is not coughing any substance up. Denies fever but admits to SOB. Patient was winded during telephone conversation. States incisions are draining, however, he is unable to describe what drainage looks like. Patient has CT in place and states he has had 50ml output in 24 hours. States he is eating a dysphagia diet with last PO intake of yesterday. Discussed with PA, E. Barrett. Advised patient per recommendations to use OTC cough suppressants as needed for coughing spells. Advised if SOB worsens to report to the ED for evaluation. Advised to continue tube feed and PO intake of dysphagia food as tolerated. Upon return call to patient, he was much less short of breath during conversation. Dr. Deloise Ferries also attempted to place telephone call to patient but was unsuccessful reaching. Patient has a follow up in the office on Friday. Patient aware to report to ED if needed. Nothing further at this time.

## 2023-09-18 ENCOUNTER — Other Ambulatory Visit: Payer: Self-pay

## 2023-09-18 ENCOUNTER — Telehealth: Payer: Self-pay | Admitting: Oncology

## 2023-09-18 NOTE — Telephone Encounter (Signed)
 Seth Burch scheduled his follow up appointment. Seth Burch mentioned that he is still having complications from his surgery and will not know if he can make it on 5/15. I advised Seth Burch to contact us  to reschedule if he cannot make the appointment.

## 2023-09-19 ENCOUNTER — Other Ambulatory Visit: Payer: Self-pay

## 2023-09-19 ENCOUNTER — Ambulatory Visit
Attending: Thoracic Surgery (Cardiothoracic Vascular Surgery) | Admitting: Thoracic Surgery (Cardiothoracic Vascular Surgery)

## 2023-09-19 ENCOUNTER — Encounter: Payer: Self-pay | Admitting: Thoracic Surgery (Cardiothoracic Vascular Surgery)

## 2023-09-19 ENCOUNTER — Inpatient Hospital Stay (HOSPITAL_COMMUNITY)
Admission: AD | Admit: 2023-09-19 | Discharge: 2023-10-14 | DRG: 326 | Disposition: A | Discharge status: Home Health | Source: Ambulatory Visit | Attending: Thoracic Surgery (Cardiothoracic Vascular Surgery) | Admitting: Thoracic Surgery (Cardiothoracic Vascular Surgery)

## 2023-09-19 ENCOUNTER — Encounter (HOSPITAL_COMMUNITY): Payer: Self-pay

## 2023-09-19 VITALS — BP 118/75 | HR 105 | Resp 22 | Ht 70.0 in | Wt 163.0 lb

## 2023-09-19 DIAGNOSIS — Z934 Other artificial openings of gastrointestinal tract status: Secondary | ICD-10-CM | POA: Diagnosis not present

## 2023-09-19 DIAGNOSIS — E871 Hypo-osmolality and hyponatremia: Secondary | ICD-10-CM | POA: Diagnosis not present

## 2023-09-19 DIAGNOSIS — J940 Chylous effusion: Secondary | ICD-10-CM | POA: Diagnosis present

## 2023-09-19 DIAGNOSIS — L24B1 Irritant contact dermatitis related to digestive stoma or fistula: Secondary | ICD-10-CM | POA: Diagnosis present

## 2023-09-19 DIAGNOSIS — I129 Hypertensive chronic kidney disease with stage 1 through stage 4 chronic kidney disease, or unspecified chronic kidney disease: Secondary | ICD-10-CM | POA: Diagnosis present

## 2023-09-19 DIAGNOSIS — K223 Perforation of esophagus: Secondary | ICD-10-CM | POA: Diagnosis present

## 2023-09-19 DIAGNOSIS — C155 Malignant neoplasm of lower third of esophagus: Secondary | ICD-10-CM | POA: Diagnosis not present

## 2023-09-19 DIAGNOSIS — E875 Hyperkalemia: Secondary | ICD-10-CM | POA: Diagnosis present

## 2023-09-19 DIAGNOSIS — E785 Hyperlipidemia, unspecified: Secondary | ICD-10-CM | POA: Diagnosis present

## 2023-09-19 DIAGNOSIS — K9189 Other postprocedural complications and disorders of digestive system: Secondary | ICD-10-CM | POA: Diagnosis present

## 2023-09-19 DIAGNOSIS — R197 Diarrhea, unspecified: Secondary | ICD-10-CM | POA: Diagnosis not present

## 2023-09-19 DIAGNOSIS — N401 Enlarged prostate with lower urinary tract symptoms: Secondary | ICD-10-CM | POA: Diagnosis present

## 2023-09-19 DIAGNOSIS — Z91128 Patient's intentional underdosing of medication regimen for other reason: Secondary | ICD-10-CM

## 2023-09-19 DIAGNOSIS — B9561 Methicillin susceptible Staphylococcus aureus infection as the cause of diseases classified elsewhere: Secondary | ICD-10-CM | POA: Diagnosis present

## 2023-09-19 DIAGNOSIS — T85598A Other mechanical complication of other gastrointestinal prosthetic devices, implants and grafts, initial encounter: Secondary | ICD-10-CM | POA: Diagnosis not present

## 2023-09-19 DIAGNOSIS — N179 Acute kidney failure, unspecified: Secondary | ICD-10-CM | POA: Diagnosis not present

## 2023-09-19 DIAGNOSIS — E1122 Type 2 diabetes mellitus with diabetic chronic kidney disease: Secondary | ICD-10-CM | POA: Diagnosis present

## 2023-09-19 DIAGNOSIS — I9789 Other postprocedural complications and disorders of the circulatory system, not elsewhere classified: Secondary | ICD-10-CM | POA: Diagnosis not present

## 2023-09-19 DIAGNOSIS — R627 Adult failure to thrive: Secondary | ICD-10-CM | POA: Diagnosis present

## 2023-09-19 DIAGNOSIS — D472 Monoclonal gammopathy: Secondary | ICD-10-CM | POA: Diagnosis present

## 2023-09-19 DIAGNOSIS — R053 Chronic cough: Secondary | ICD-10-CM | POA: Diagnosis present

## 2023-09-19 DIAGNOSIS — Y844 Aspiration of fluid as the cause of abnormal reaction of the patient, or of later complication, without mention of misadventure at the time of the procedure: Secondary | ICD-10-CM | POA: Diagnosis not present

## 2023-09-19 DIAGNOSIS — Z9889 Other specified postprocedural states: Secondary | ICD-10-CM

## 2023-09-19 DIAGNOSIS — E43 Unspecified severe protein-calorie malnutrition: Secondary | ICD-10-CM | POA: Diagnosis present

## 2023-09-19 DIAGNOSIS — E1165 Type 2 diabetes mellitus with hyperglycemia: Secondary | ICD-10-CM | POA: Diagnosis present

## 2023-09-19 DIAGNOSIS — D72829 Elevated white blood cell count, unspecified: Secondary | ICD-10-CM | POA: Diagnosis not present

## 2023-09-19 DIAGNOSIS — D631 Anemia in chronic kidney disease: Secondary | ICD-10-CM | POA: Diagnosis present

## 2023-09-19 DIAGNOSIS — Z794 Long term (current) use of insulin: Secondary | ICD-10-CM | POA: Diagnosis not present

## 2023-09-19 DIAGNOSIS — R338 Other retention of urine: Secondary | ICD-10-CM | POA: Diagnosis not present

## 2023-09-19 DIAGNOSIS — Y832 Surgical operation with anastomosis, bypass or graft as the cause of abnormal reaction of the patient, or of later complication, without mention of misadventure at the time of the procedure: Secondary | ICD-10-CM | POA: Diagnosis present

## 2023-09-19 DIAGNOSIS — N183 Chronic kidney disease, stage 3 unspecified: Secondary | ICD-10-CM | POA: Diagnosis present

## 2023-09-19 DIAGNOSIS — Z79899 Other long term (current) drug therapy: Secondary | ICD-10-CM

## 2023-09-19 DIAGNOSIS — K5900 Constipation, unspecified: Secondary | ICD-10-CM | POA: Diagnosis not present

## 2023-09-19 DIAGNOSIS — Z6822 Body mass index (BMI) 22.0-22.9, adult: Secondary | ICD-10-CM

## 2023-09-19 DIAGNOSIS — T383X6A Underdosing of insulin and oral hypoglycemic [antidiabetic] drugs, initial encounter: Secondary | ICD-10-CM | POA: Diagnosis present

## 2023-09-19 DIAGNOSIS — R109 Unspecified abdominal pain: Secondary | ICD-10-CM | POA: Diagnosis not present

## 2023-09-19 DIAGNOSIS — E11649 Type 2 diabetes mellitus with hypoglycemia without coma: Secondary | ICD-10-CM | POA: Diagnosis not present

## 2023-09-19 DIAGNOSIS — E876 Hypokalemia: Secondary | ICD-10-CM | POA: Diagnosis not present

## 2023-09-19 DIAGNOSIS — Z8501 Personal history of malignant neoplasm of esophagus: Secondary | ICD-10-CM

## 2023-09-19 LAB — COMPREHENSIVE METABOLIC PANEL WITH GFR
ALT: 13 U/L (ref 0–44)
AST: 17 U/L (ref 15–41)
Albumin: 1.7 g/dL — ABNORMAL LOW (ref 3.5–5.0)
Alkaline Phosphatase: 131 U/L — ABNORMAL HIGH (ref 38–126)
Anion gap: 12 (ref 5–15)
BUN: 37 mg/dL — ABNORMAL HIGH (ref 8–23)
CO2: 21 mmol/L — ABNORMAL LOW (ref 22–32)
Calcium: 8.2 mg/dL — ABNORMAL LOW (ref 8.9–10.3)
Chloride: 104 mmol/L (ref 98–111)
Creatinine, Ser: 1.61 mg/dL — ABNORMAL HIGH (ref 0.61–1.24)
GFR, Estimated: 47 mL/min — ABNORMAL LOW (ref >60)
Glucose, Bld: 504 mg/dL (ref 70–99)
Potassium: 3.4 mmol/L — ABNORMAL LOW (ref 3.5–5.1)
Sodium: 137 mmol/L (ref 135–145)
Total Bilirubin: 0.8 mg/dL (ref 0.0–1.2)
Total Protein: 6.3 g/dL — ABNORMAL LOW (ref 6.5–8.1)

## 2023-09-19 LAB — CBC
HCT: 22.9 % — ABNORMAL LOW (ref 39.0–52.0)
Hemoglobin: 7.4 g/dL — ABNORMAL LOW (ref 13.0–17.0)
MCH: 27.8 pg (ref 26.0–34.0)
MCHC: 32.3 g/dL (ref 30.0–36.0)
MCV: 86.1 fL (ref 80.0–100.0)
Platelets: 388 10*3/uL (ref 150–400)
RBC: 2.66 MIL/uL — ABNORMAL LOW (ref 4.22–5.81)
RDW: 16.1 % — ABNORMAL HIGH (ref 11.5–15.5)
WBC: 10.3 10*3/uL (ref 4.0–10.5)
nRBC: 0 % (ref 0.0–0.2)

## 2023-09-19 LAB — GLUCOSE, CAPILLARY
Glucose-Capillary: 409 mg/dL — ABNORMAL HIGH (ref 70–99)
Glucose-Capillary: 361 mg/dL — ABNORMAL HIGH (ref 70–99)

## 2023-09-19 LAB — FUNGUS CULTURE RESULT

## 2023-09-19 LAB — FUNGUS CULTURE WITH STAIN

## 2023-09-19 LAB — FUNGAL ORGANISM REFLEX

## 2023-09-19 MED ORDER — INSULIN ASPART 100 UNIT/ML IJ SOLN
0.0000 [IU] | Freq: Three times a day (TID) | INTRAMUSCULAR | Status: DC
  Administered 2023-09-19: 9 [IU] via SUBCUTANEOUS
  Administered 2023-09-20: 7 [IU] via SUBCUTANEOUS
  Administered 2023-09-20: 9 [IU] via SUBCUTANEOUS
  Administered 2023-09-20: 3 [IU] via SUBCUTANEOUS
  Administered 2023-09-21: 9 [IU] via SUBCUTANEOUS
  Administered 2023-09-21: 7 [IU] via SUBCUTANEOUS
  Administered 2023-09-21: 5 [IU] via SUBCUTANEOUS
  Administered 2023-09-22: 2 [IU] via SUBCUTANEOUS
  Administered 2023-09-23 (×3): 9 [IU] via SUBCUTANEOUS
  Administered 2023-09-24: 5 [IU] via SUBCUTANEOUS
  Administered 2023-09-24: 9 [IU] via SUBCUTANEOUS

## 2023-09-19 MED ORDER — ATORVASTATIN CALCIUM 10 MG PO TABS
20.0000 mg | ORAL_TABLET | Freq: Every day | ORAL | Status: DC
  Filled 2023-09-19: qty 2

## 2023-09-19 MED ORDER — MEDIUM CHAIN TRIGLYCERIDES PO OIL
15.0000 mL | TOPICAL_OIL | Freq: Three times a day (TID) | ORAL | Status: DC
  Administered 2023-09-19 – 2023-09-29 (×27): 15 mL
  Filled 2023-09-19 (×32): qty 15

## 2023-09-19 MED ORDER — THIAMINE MONONITRATE 100 MG PO TABS
100.0000 mg | ORAL_TABLET | Freq: Every day | ORAL | Status: DC
Start: 2023-09-19 — End: 2023-09-20
  Administered 2023-09-19: 100 mg via ORAL
  Filled 2023-09-19 (×2): qty 1

## 2023-09-19 MED ORDER — SACCHAROMYCES BOULARDII 250 MG PO CAPS
250.0000 mg | ORAL_CAPSULE | Freq: Two times a day (BID) | ORAL | Status: DC
  Administered 2023-09-19 – 2023-09-20 (×3): 250 mg via ORAL
  Filled 2023-09-19 (×4): qty 1

## 2023-09-19 MED ORDER — INSULIN ASPART 100 UNIT/ML IJ SOLN
0.0000 [IU] | Freq: Every day | INTRAMUSCULAR | Status: DC
Start: 2023-09-19 — End: 2023-09-26
  Administered 2023-09-19: 5 [IU] via SUBCUTANEOUS
  Administered 2023-09-20: 3 [IU] via SUBCUTANEOUS
  Administered 2023-09-21: 2 [IU] via SUBCUTANEOUS
  Administered 2023-09-23: 4 [IU] via SUBCUTANEOUS
  Administered 2023-09-24: 2 [IU] via SUBCUTANEOUS
  Administered 2023-09-25: 4 [IU] via SUBCUTANEOUS

## 2023-09-19 MED ORDER — NUTRISOURCE FIBER PO PACK
1.0000 | PACK | Freq: Two times a day (BID) | ORAL | Status: DC
  Administered 2023-09-19 – 2023-09-20 (×3): 1 via ORAL
  Filled 2023-09-19 (×4): qty 1

## 2023-09-19 MED ORDER — PROSOURCE TF20 ENFIT COMPATIBL EN LIQD
60.0000 mL | Freq: Two times a day (BID) | ENTERAL | Status: DC
  Administered 2023-09-19 – 2023-09-25 (×11): 60 mL
  Filled 2023-09-19 (×10): qty 60

## 2023-09-19 MED ORDER — METOPROLOL TARTRATE 25 MG PO TABS
25.0000 mg | ORAL_TABLET | Freq: Two times a day (BID) | ORAL | Status: DC
  Administered 2023-09-19 – 2023-09-20 (×2): 25 mg via ORAL
  Filled 2023-09-19 (×2): qty 1

## 2023-09-19 MED ORDER — GERHARDT'S BUTT CREAM
TOPICAL_CREAM | Freq: Three times a day (TID) | CUTANEOUS | Status: DC
  Administered 2023-09-20 – 2023-10-10 (×6): 1 via TOPICAL
  Filled 2023-09-19 (×2): qty 60

## 2023-09-19 MED ORDER — FOLIC ACID 1 MG PO TABS
1.0000 mg | ORAL_TABLET | Freq: Every day | ORAL | Status: DC
  Administered 2023-09-19: 1 mg via ORAL
  Filled 2023-09-19 (×2): qty 1

## 2023-09-19 MED ORDER — NUTRISOURCE FIBER PO PACK
1.0000 | PACK | Freq: Two times a day (BID) | ORAL | Status: DC
  Administered 2023-09-19: 1
  Filled 2023-09-19 (×2): qty 1

## 2023-09-19 MED ORDER — HYDROCOD POLI-CHLORPHE POLI ER 10-8 MG/5ML PO SUER
5.0000 mL | Freq: Four times a day (QID) | ORAL | Status: DC | PRN
  Administered 2023-09-19: 5 mL via ORAL
  Filled 2023-09-19: qty 5

## 2023-09-19 MED ORDER — AMLODIPINE BESYLATE 10 MG PO TABS
10.0000 mg | ORAL_TABLET | Freq: Every day | ORAL | Status: DC
  Filled 2023-09-19: qty 1

## 2023-09-19 MED ORDER — ENOXAPARIN SODIUM 40 MG/0.4ML IJ SOSY
40.0000 mg | PREFILLED_SYRINGE | INTRAMUSCULAR | Status: DC
  Administered 2023-09-19 – 2023-10-14 (×26): 40 mg via SUBCUTANEOUS
  Filled 2023-09-19 (×26): qty 0.4

## 2023-09-19 MED ORDER — CHLORHEXIDINE GLUCONATE CLOTH 2 % EX PADS
6.0000 | MEDICATED_PAD | Freq: Every day | CUTANEOUS | Status: DC
  Administered 2023-09-19 – 2023-10-14 (×25): 6 via TOPICAL

## 2023-09-19 MED ORDER — ACETAMINOPHEN 325 MG PO TABS: 650.0000 mg | ORAL_TABLET | Freq: Four times a day (QID) | ORAL | Status: DC | PRN

## 2023-09-19 MED ORDER — ACETAMINOPHEN 650 MG RE SUPP: 650.0000 mg | Freq: Four times a day (QID) | RECTAL | Status: DC | PRN

## 2023-09-19 MED ORDER — TAMSULOSIN HCL 0.4 MG PO CAPS
0.4000 mg | ORAL_CAPSULE | Freq: Every day | ORAL | Status: DC
  Administered 2023-09-20 – 2023-09-21 (×2): 0.4 mg via ORAL
  Filled 2023-09-19 (×8): qty 1

## 2023-09-19 MED ORDER — NON FORMULARY: 1560.0000 mL | Status: DC

## 2023-09-19 MED ORDER — OXYCODONE HCL 5 MG PO TABS
5.0000 mg | ORAL_TABLET | ORAL | Status: DC | PRN
  Administered 2023-09-20: 5 mg via ORAL
  Filled 2023-09-19: qty 1

## 2023-09-19 MED ORDER — INSULIN GLARGINE-YFGN 100 UNIT/ML ~~LOC~~ SOLN
14.0000 [IU] | Freq: Every day | SUBCUTANEOUS | Status: DC
  Filled 2023-09-19: qty 0.14

## 2023-09-19 MED ORDER — PANTOPRAZOLE SODIUM 40 MG PO TBEC
40.0000 mg | DELAYED_RELEASE_TABLET | Freq: Every day | ORAL | Status: DC
  Administered 2023-09-21: 40 mg via ORAL
  Filled 2023-09-19 (×2): qty 1

## 2023-09-19 MED ORDER — ONDANSETRON HCL 4 MG PO TABS: 4.0000 mg | ORAL_TABLET | Freq: Four times a day (QID) | ORAL | Status: DC | PRN

## 2023-09-19 MED ORDER — SODIUM CHLORIDE 0.9% FLUSH
10.0000 mL | Freq: Two times a day (BID) | INTRAVENOUS | Status: DC
  Administered 2023-09-19: 10 mL
  Administered 2023-09-19: 20 mL
  Administered 2023-09-20 – 2023-09-21 (×3): 10 mL
  Administered 2023-09-22: 20 mL
  Administered 2023-09-22 – 2023-09-28 (×11): 10 mL
  Administered 2023-09-28: 20 mL
  Administered 2023-09-29 – 2023-10-01 (×6): 10 mL
  Administered 2023-10-02 – 2023-10-03 (×3): 20 mL
  Administered 2023-10-04 – 2023-10-06 (×5): 10 mL

## 2023-09-19 MED ORDER — ADULT MULTIVITAMIN W/MINERALS CH
1.0000 | ORAL_TABLET | Freq: Every day | ORAL | Status: DC
  Filled 2023-09-19 (×2): qty 1

## 2023-09-19 MED ORDER — MORPHINE SULFATE (PF) 2 MG/ML IV SOLN
2.0000 mg | INTRAVENOUS | Status: DC | PRN
  Administered 2023-09-20 – 2023-10-10 (×11): 2 mg via INTRAVENOUS
  Filled 2023-09-19 (×11): qty 1

## 2023-09-19 MED ORDER — ONDANSETRON HCL 4 MG/2ML IJ SOLN: 4.0000 mg | Freq: Four times a day (QID) | INTRAMUSCULAR | Status: DC | PRN

## 2023-09-19 MED ORDER — ZINC OXIDE 40 % EX OINT
TOPICAL_OINTMENT | Freq: Three times a day (TID) | CUTANEOUS | Status: DC | PRN
  Filled 2023-09-19: qty 57

## 2023-09-19 MED ORDER — SODIUM CHLORIDE 0.9% FLUSH
10.0000 mL | INTRAVENOUS | Status: DC | PRN
  Administered 2023-09-19: 20 mL
  Administered 2023-09-23: 30 mL

## 2023-09-19 MED ORDER — VIVONEX RTF PO LIQD
1560.0000 mL | ORAL | Status: DC
  Administered 2023-09-19 – 2023-09-20 (×2): 1000 mL via ORAL
  Filled 2023-09-19 (×3): qty 1560

## 2023-09-19 NOTE — Consult Note (Addendum)
 WOC team consulted for skin breakdown around PEG tube; per PA note J tube bilious drainage with skin maceration   WOC Nurse Consult Note: Reason for Consult: skin breakdown from J tube drainage  Wound type: partial thickness skin loss r/t moisture  ICD-10 CM Codes for Irritant Dermatitis L24B1 - Related to digestive stoma or fistula Pressure Injury POA:NA, not related to pressure   Measurement: Wound bed: erythema with partial thickness skin loss r/t moisture  Drainage (amount, consistency, odor) bilious around J tube  Periwound: erythema  Dressing procedure/placement/frequency: Cleanse around J tube with Vashe wound cleanser Timm Foot [phone]), do not rinse and allow to air dry. May apply a thin layer of Desitin to irritated skin around J tube 3 times a day and prn soiling.  Place a split gauze under bumper.  If split gauze is staying moist from too much drainage can try Drawtex super absorbent dressing Timm Foot [phone]).   POC discussed with bedside nurse.  WOC team will not follow.  Re-consult if further needs arise.   Thank you,     Ronni Colace MSN, RN-BC, [employer] [phone]

## 2023-09-19 NOTE — H&P (Signed)
 [address]       [name] [address]             [phone]      Subjective:   Seth Burch is a 68 yo male well known to [facility].  He underwent Robotic Assisted Esophagectomy by Dr. Deloise Ferries on 07/28/2023.  He has suffered a long and complicated recovery course.  He was initially discharged home on 08/19/2023.  However, he presented to the Emergency Department later that same day due to increased pain after eating.  CT scan obtained in the ED confirmed a right hydropneumothorax, gas collection with disruption of anastomotic repair most consistent with esophageal.  Also at that time he had elevated creatinine level of 1.9.  He was admitted, made NPO, IV hydration, and broad spectrum antibiotics.  He also suffered large amount of leakage around his J Tube during that admission.  He was taken to the operating room an underwent Right VATS with decortication, drainage of effusion, and EGD with esophageal stent placement.  He underwent multiple J tube exchanges which ultimately decreased the amount of leakage. 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  He also underwent IR coil and glue emobolization of his thoracic duct.  Finally he had significant drainage from his right chest tube site.  He was felt stable for discharge by Dr. Deloise Ferries on 09/15/2023.  He presented for follow up with Dr. Deloise Ferries on 09/19/2023 at which time he was noted to have low oxygen saturations of 85%.  He was also felt to be experiencing failure to thrive.  His chest tube remained in place and was still showing milky output of 150.  It was felt he would warrant re-admission to the hospital.  Initially the patient did okay at home.  However about 48-72 hours after discharge he developed severe coughing episodes which would last so long he was struggling to breath.  Overall his chest tube output had been minimal.  However today he has dumped about 200 cc of greenish  fluid.  Also of note they stated the J tube drainage significantly increased over the past few days and he just soaks through dressings.  He had not been taking his insulin  and was having sugars over 500, they stated he took it today.  Oral intake has really remained poor, especially after development of his coughing episodes.   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   Anemia of chronic disease 09/15/2023   SIRS (systemic inflammatory response syndrome) (HCC) 09/02/2023   Jejunostomy tube leak (HCC) 08/27/2023   Protein-calorie malnutrition, severe 08/22/2023   Esophagectomy, anastomotic leak 08/20/2023   Chest pain 08/19/2023   Malnutrition of moderate degree 07/30/2023   Esophageal cancer (HCC) 07/28/2023   H/O esophagectomy 07/28/2023   Chemotherapy-induced thrombocytopenia 05/22/2023   Port-A-Cath in place 05/02/2023   Leukopenia due to antineoplastic chemotherapy (HCC) 05/02/2023   CKD (chronic kidney disease) stage 3, GFR 30-59 ml/min (HCC) 04/24/2023   Dysphagia 03/27/2023   Unintentional weight loss 03/27/2023   Primary adenocarcinoma of distal third of esophagus (HCC) 03/11/2023   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   Past Medical History:  Diagnosis Date   Arthritis    Cancer (HCC)    Chronic kidney disease    Diabetes mellitus (HCC)    Dyslipidemia    Hypertension  Past Surgical History:  Procedure Laterality Date   ESOPHAGOGASTRODUODENOSCOPY N/A 07/28/2023   Procedure: EGD (ESOPHAGOGASTRODUODENOSCOPY);  Surgeon: Hilarie Lovely, MD;  [facility];  Service: Thoracic;  Laterality: N/A;   ESOPHAGOGASTRODUODENOSCOPY N/A 08/20/2023   Procedure: EGD (ESOPHAGOGASTRODUODENOSCOPY);  Surgeon: Hilarie Lovely, MD;  Location: [facility] OR;  Service: Thoracic;  Laterality: N/A;   ESOPHAGOGASTRODUODENOSCOPY (EGD) WITH PROPOFOL  N/A 04/04/2023   Procedure:  ESOPHAGOGASTRODUODENOSCOPY (EGD) WITH PROPOFOL ;  Surgeon: Alvis Jourdain, MD;  Location: [facility] ENDOSCOPY;  Service: Gastroenterology;  Laterality: N/A;   IR EMBO ART  VEN HEMORR LYMPH EXTRAV  INC GUIDE ROADMAPPING  09/11/2023   IR FLUORO GUIDED NEEDLE PLC ASPIRATION/INJECTION LOC  09/11/2023   IR IMAGING GUIDED PORT INSERTION  04/09/2023   IR LYMPHANGIOGRAM PEL/ABD UNI LEFT  09/11/2023   IR REPLC GASTRO/COLONIC TUBE PERCUT W/FLUORO  08/27/2023   IR REPLC GASTRO/COLONIC TUBE PERCUT W/FLUORO  08/29/2023   IR US  GUIDANCE  09/11/2023   IR US  GUIDANCE  09/11/2023   IR US  GUIDE VASC ACCESS LEFT  09/11/2023   RADIOLOGY WITH ANESTHESIA N/A 09/11/2023   Procedure: RADIOLOGY WITH ANESTHESIA;  Surgeon: Myrlene Asper, DO;  Location: [facility] OR;  Service: Anesthesiology;  Laterality: N/A;  Chyle leak embolization   UPPER ESOPHAGEAL ENDOSCOPIC ULTRASOUND (EUS) N/A 04/04/2023   Procedure: UPPER ESOPHAGEAL ENDOSCOPIC ULTRASOUND (EUS);  Surgeon: Alvis Jourdain, MD;  Location: [facility];  Service: Gastroenterology;  Laterality: N/A;   VIDEO ASSISTED THORACOSCOPY (VATS)/DECORTICATION Right 08/20/2023   Procedure: VIDEO ASSISTED THORACOSCOPY (VATS)/DECORTICATION;  Surgeon: Hilarie Lovely, MD;  Location: [facility] OR;  Service: Thoracic;  Laterality: Right;    Medications Prior to Admission  Medication Sig Dispense Refill Last Dose/Taking   acetaminophen  (TYLENOL ) 325 MG tablet Take 1-2 tablets (325-650 mg total) by mouth every 4 (four) hours as needed.      amLODipine  (NORVASC ) 10 MG tablet Take 1 tablet (10 mg total) by mouth daily. 30 tablet 3    atorvastatin  (LIPITOR) 20 MG tablet Take 20 mg by mouth at bedtime.      fiber (NUTRISOURCE FIBER) PACK packet Place 1 packet into feeding tube 2 (two) times daily. 75 each 1    LANTUS  SOLOSTAR 100 UNIT/ML Solostar Pen Inject 14 Units into the skin daily.      medium chain triglycerides  (MCT OIL) oil Place 15 mLs into feeding tube 3 (three) times daily. 21 mL 0    metoprolol  tartrate  (LOPRESSOR ) 25 MG tablet Take 1 tablet (25 mg total) by mouth 2 (two) times daily.      Multiple Vitamin (MULTIVITAMIN WITH MINERALS) TABS tablet Take 1 tablet by mouth daily.      Nutritional Supplements (VIVONEX RTF) LIQD Place 1,000 mLs into feeding tube continuous.      Nystatin (GERHARDT'S BUTT CREAM) CREA Apply 1 Application topically 3 (three) times daily. 1 each 1    omeprazole (PRILOSEC) 40 MG capsule Take 40 mg by mouth daily.      oxyCODONE  (OXY IR/ROXICODONE ) 5 MG immediate release tablet Take 1 tablet (5 mg total) by mouth every 4 (four) hours as needed for severe pain (pain score 7-10). 30 tablet 0    Protein (FEEDING SUPPLEMENT, PROSOURCE TF20,) liquid Place 60 mLs into feeding tube 2 (two) times daily.      saccharomyces boulardii (FLORASTOR) 250 MG capsule Take 1 capsule (250 mg total) by mouth 2 (two) times daily. 60 capsule 1    tamsulosin  (FLOMAX ) 0.4 MG CAPS capsule Take 0.4 mg by mouth daily.  thiamine  (VITAMIN B1) 100 MG tablet Take 1 tablet (100 mg total) by mouth daily. 30 tablet 1    No Known Allergies  Social History   Tobacco Use   Smoking status: Never   Smokeless tobacco: Never  Substance Use Topics   Alcohol  use: Yes    Alcohol /week: 1.0 standard drink of alcohol     Types: 1 Cans of beer per week     Objective:   BP 125/65 (BP Location: Right Arm)   Pulse (!) 107   Resp (!) 33   SpO2 93%   General appearance: alert, cooperative, and pale, looks very malnourished, worsening appearance since discharge Lungs: diminished breath sounds on right Chest wall: no tenderness, improvement of right sided chest drainage... CT remains in place with greenish milky drainage Heart: regular rate and rhythm and tachy Abdomen: mild distention, significant increase in bilious drainage around J Tube, skin maceration persists Extremities: extremities normal, atraumatic, no cyanosis or edema Neurologic: Grossly normal  Assessment:   Seth Burch is well known to  [facility].  S/P Esophagectomy 3/10 by Dr. Deloise Ferries.  Complicated by Anastomotic Leak, Chylothorax, failure to thrive, persistent poor oral intake  Plan:   Admit to Inpatient Chylothorax- chest tube remains in place, per family minimal output and now with 200 cc greenish milky discharge.. ? Culture? Esophageal Anastomotic Leak- patient with stent in place.. plan is to do EGD with possible Stent Removal on Monday Severe Protein Calorie Malnutrition- on dysphagia 1 diet.[name] no plans to advance currently... nutrition has been consulted to restart tube feeds previously on Vivonex at 65 ml/hr..... discussed with patient importance of eating what he can Cough- severe- per patient would last for long period of time making it difficult to breath...wife/daughter states he prob used 1/2 bottle of delsym in past 2 days.. willl add prn Tussinex DM- per family patient not taking insulin  at discharge, however sugar was over 500 today so he took 26 units of lantus .. have ordered Semglee  at 14 units daily, SSIP will adjust as needed J tube Bilious Drainage- Dr. Deloise Ferries evaluated in office, remains in place.. continue diligent wound care Labs ordered, CXR in AM  Serapio Edelson, PA-C 2:21 PM 09/19/23

## 2023-09-19 NOTE — Progress Notes (Signed)
 Nutrition Brief Note  Patient with previous extended admission. Currently re-admitted 1 hour ago for esophageal anastomotic leak.   Consult received for enteral nutrition initiation and management.  "Start at 65 ml/hr.Seth Burch on Vivonex at discharge... will also need stool bulking agents"  Given patient just arriving to floor and H&P not completed at this time, will place order to resume Vivonex and full nutrition assessment to follow.   Seth Burch, RDN, LDN Clinical Nutrition See AMiON for contact information.

## 2023-09-19 NOTE — Hospital Course (Addendum)
 History of Present Illness:  Seth Burch is a 68 yo male well known to [facility].  He underwent Robotic Assisted Esophagectomy by Dr. Deloise Ferries on 07/28/2023.  He has suffered a long and complicated recovery course.  He was initially discharged home on 08/19/2023.  However, he presented to the Emergency Department later that same day due to increased pain after eating.  CT scan obtained in the ED confirmed a right hydropneumothorax, gas collection with disruption of anastomotic repair most consistent with esophageal.  Also at that time he had elevated creatinine level of 1.9.  He was admitted, made NPO, IV hydration, and broad spectrum antibiotics.  He also suffered large amount of leakage around his J Tube during that admission.  He was taken to the operating room an underwent Right VATS with decortication, drainage of effusion, and EGD with esophageal stent placement.  He underwent multiple J tube exchanges which ultimately decreased the amount of leakage. 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  He also underwent IR coil and glue emobolization of his thoracic duct.  Finally he had significant drainage from his right chest tube site.  He was felt stable for discharge by Dr. Deloise Ferries on 09/15/2023.  He presented for follow up with Dr. Deloise Ferries on 09/19/2023 at which time he was noted to have low oxygen saturations of 85%.  He was also felt to be experiencing failure to thrive.  His chest tube remained in place and was still showing milky output of 150.  It was felt he would warrant re-admission to the hospital.  Initially the patient did okay at home.  However about 48-72 hours after discharge he developed severe coughing episodes which would last so long he was struggling to breath.  Overall his chest tube output had been minimal.  However today he has dumped about 200 cc of greenish fluid.  Also of note they stated the J tube drainage significantly increased over the  past few days and he just soaks through dressings.  He had not been taking his insulin  and was having sugars over 500, they stated he took it today.  Oral intake has really remained poor, especially after development of his coughing episodes.     Hospital Course:  The patient was admitted to the hospital for our office. He was treated with Tussionex which he felt made the cough worse.  This was stopped and Tessalon  was started which was also not tolerated.  He was coughing up extremely thick sputum.  Robitussin DM was started.   Chest tube output was high.  His chest xray overall looks good without evidence of pleural effusion.  His lab work showed no evidence of leukocytosis.  He was noted to have low phosphorus and IV supplementation was ordered.  His blood sugars remained elevated and Semeglee was increased until control could be obtained.  The patient was taken to the endoscopy suite on 09/22/23 where EGD was performed. The esophageal stent was removed revealing persistent perforation of the esophagus.   A new stent was placed and nutritional support resumed tube feeding by way of the jejunostomy tube.  The white chylous drainage from the right chest tube gradually decreased but he continued to have drainage around the jejunostomy tube.  Careful attention was given to wound care at the jejunostomy exit site to minimize skin breakdown.  Supportive care continued.  On 09/30/2023 follow-up labs revealed his albumin  had improved from 1.7-2.0.  He also was noted  on that date to have worsening renal function with his creatinine rising from 1.3-1.8 over a 6-hour period.  He was hyperkalemic with potassium of 5.8.  He was given a single dose of Lokelma  and follow-up serum potassium had normalized.  On the same date, his white blood count had increased to 24,000.  Follow-up chest x-ray showed improvement in the right basilar airspace densities seen on previous films.  There were no other areas of concern on the chest  x-ray.  Urinalysis showed no evidence of UTI.  Sputum cultures were obtained showing Staph Aureus.  Right was started on empiric Zosyn  and vancomycin .  He remained afebrile.  His white count improved substantially over the next 24 hours as did his tachycardia.  As of 05/17, he was on Vancomycin , Flagyl , and Cefepime . Will continue the empiric antibiotics but when his renal function worsened slightly with a creatinine of 1.9, we changed the Zosyn  to cefepime  and Flagyl .  The vancomycin  was continued as well.  Sputum culture showed Staph Aureus (sensitive to Vancomycin ). We observed that Mr. Cudworth was able to tolerate tube feeding at lower rates but when the tube feeding actually increasing rates to find a point where his nutritional needs could be met optimally.`After discussion with RD, he was started on TPN and TFs were slowly increased to try to optimize nutritional status.  His blood sugars increased with resumption of tube feedings.  Pharmacy is managing insulin  regimen.  CT scan of chest and abdomen was obtained and showed incomplete apposition of the proximal portion of the stent graft with the wall of the esophagus.  He was also noted to have accordion like compression of the small bowel due to antegrade migration of the retaining balloon of the jejunostomy tube.  It was felt a possible revision may be warranted.  However, Dr. Deloise Ferries reviewed CT scan and did not feel the findings were concerning and no further intervention would be required at this time.  He tolerated change in tube feeding regimen.  His diarrhea was much improved.

## 2023-09-20 ENCOUNTER — Inpatient Hospital Stay (HOSPITAL_COMMUNITY)

## 2023-09-20 LAB — GLUCOSE, CAPILLARY
Glucose-Capillary: 406 mg/dL — ABNORMAL HIGH (ref 70–99)
Glucose-Capillary: 331 mg/dL — ABNORMAL HIGH (ref 70–99)
Glucose-Capillary: 226 mg/dL — ABNORMAL HIGH (ref 70–99)
Glucose-Capillary: 264 mg/dL — ABNORMAL HIGH (ref 70–99)

## 2023-09-20 LAB — BASIC METABOLIC PANEL WITH GFR
Anion gap: 7 (ref 5–15)
BUN: 39 mg/dL — ABNORMAL HIGH (ref 8–23)
CO2: 24 mmol/L (ref 22–32)
Calcium: 8 mg/dL — ABNORMAL LOW (ref 8.9–10.3)
Chloride: 104 mmol/L (ref 98–111)
Creatinine, Ser: 1.39 mg/dL — ABNORMAL HIGH (ref 0.61–1.24)
GFR, Estimated: 56 mL/min — ABNORMAL LOW (ref >60)
Glucose, Bld: 365 mg/dL — ABNORMAL HIGH (ref 70–99)
Potassium: 3.4 mmol/L — ABNORMAL LOW (ref 3.5–5.1)
Sodium: 135 mmol/L (ref 135–145)

## 2023-09-20 LAB — CBC
HCT: 23.3 % — ABNORMAL LOW (ref 39.0–52.0)
Hemoglobin: 7.3 g/dL — ABNORMAL LOW (ref 13.0–17.0)
MCH: 27.4 pg (ref 26.0–34.0)
MCHC: 31.3 g/dL (ref 30.0–36.0)
MCV: 87.6 fL (ref 80.0–100.0)
Platelets: 415 10*3/uL — ABNORMAL HIGH (ref 150–400)
RBC: 2.66 MIL/uL — ABNORMAL LOW (ref 4.22–5.81)
RDW: 16.3 % — ABNORMAL HIGH (ref 11.5–15.5)
WBC: 10.6 10*3/uL — ABNORMAL HIGH (ref 4.0–10.5)
nRBC: 0.2 % (ref 0.0–0.2)

## 2023-09-20 MED ORDER — FOLIC ACID 1 MG PO TABS
1.0000 mg | ORAL_TABLET | Freq: Every day | ORAL | Status: DC
  Administered 2023-09-20 – 2023-10-14 (×25): 1 mg
  Filled 2023-09-20 (×24): qty 1

## 2023-09-20 MED ORDER — THIAMINE MONONITRATE 100 MG PO TABS
100.0000 mg | ORAL_TABLET | Freq: Every day | ORAL | Status: DC
  Administered 2023-09-20 – 2023-10-14 (×25): 100 mg
  Filled 2023-09-20 (×24): qty 1

## 2023-09-20 MED ORDER — ADULT MULTIVITAMIN W/MINERALS CH
1.0000 | ORAL_TABLET | Freq: Every day | ORAL | Status: DC
  Administered 2023-09-20 – 2023-10-14 (×25): 1
  Filled 2023-09-20 (×24): qty 1

## 2023-09-20 MED ORDER — AMLODIPINE BESYLATE 10 MG PO TABS
10.0000 mg | ORAL_TABLET | Freq: Every day | ORAL | Status: DC
  Administered 2023-09-20 – 2023-10-14 (×25): 10 mg
  Filled 2023-09-20 (×24): qty 1

## 2023-09-20 MED ORDER — INSULIN GLARGINE-YFGN 100 UNIT/ML ~~LOC~~ SOLN
20.0000 [IU] | Freq: Every day | SUBCUTANEOUS | Status: DC
  Administered 2023-09-20: 20 [IU] via SUBCUTANEOUS
  Filled 2023-09-20 (×2): qty 0.2

## 2023-09-20 MED ORDER — OXYCODONE HCL 5 MG PO TABS
5.0000 mg | ORAL_TABLET | ORAL | Status: DC | PRN
  Administered 2023-09-20 – 2023-10-07 (×7): 5 mg
  Filled 2023-09-20 (×7): qty 1

## 2023-09-20 MED ORDER — BENZONATATE 100 MG PO CAPS
200.0000 mg | ORAL_CAPSULE | Freq: Three times a day (TID) | ORAL | Status: DC | PRN
  Administered 2023-09-21: 200 mg via ORAL
  Filled 2023-09-20: qty 2

## 2023-09-20 MED ORDER — ATORVASTATIN CALCIUM 10 MG PO TABS
20.0000 mg | ORAL_TABLET | Freq: Every day | ORAL | Status: DC
  Administered 2023-09-20 – 2023-10-14 (×25): 20 mg
  Filled 2023-09-20 (×25): qty 2

## 2023-09-20 MED ORDER — POTASSIUM CHLORIDE 20 MEQ PO PACK
40.0000 meq | PACK | Freq: Every day | ORAL | Status: DC
  Administered 2023-09-20 – 2023-09-30 (×11): 40 meq
  Filled 2023-09-20 (×12): qty 2

## 2023-09-20 MED ORDER — METOPROLOL TARTRATE 25 MG PO TABS
25.0000 mg | ORAL_TABLET | Freq: Two times a day (BID) | ORAL | Status: DC
  Administered 2023-09-20 – 2023-10-14 (×46): 25 mg
  Filled 2023-09-20 (×48): qty 1

## 2023-09-20 NOTE — Progress Notes (Signed)
 Initial Nutrition Assessment  DOCUMENTATION CODES:   Not applicable  INTERVENTION:   TF  via J-tube: Vivonex at 65ml/hr (1560ml per day) 60ml ProSource TF20 BID   Providing 1720kcal (78% minimum estimated needs), 118g protein (103% of minimum estimated needs) and per day, 18g fat   Recommend advancing TF back to goal as tolerated, Goal TF regimen- Vivonex at 70ml/hr (1680 ml per day) ProSource TF20 60ml BID   TF at goal provides- 1840 kcal (84% estimated needs), 124g protein (108% estimated needs), 1424 ml free water  daily, and 19g fat   If patient is amenable recommend transitioning to [employer] 1.4 to better meet patients needs Monitor magnesium , potassium, and phosphorus BID for at least 3 days, MD to replete as needed, as pt is at risk for refeeding syndrome  Continue 100 mg thiamine  x 7 days  Continue MVI with minerals daily  Continue to encourage PO intake to meet 640kcal deficit Continue nutrisource fiber BID to provide 3g soluble fiber per serving to aid in bulking of stool Encourage family to bring in protein supplements for patient as he does not like ONS on formulary  Room service with assist due to patient missing meals  Recommend updated weight   If patient continues with bilious drainage via J tube or is unable to tolerate tube feeds at goal recommend initiation of TPN   NUTRITION DIAGNOSIS:   Inadequate oral intake related to cancer and cancer related treatments, chronic illness as evidenced by meal completion < 50%.   GOAL:   Patient will meet greater than or equal to 90% of their needs   MONITOR:   PO intake, Diet advancement, Supplement acceptance, Labs  REASON FOR ASSESSMENT:   Consult Enteral/tube feeding initiation and management  ASSESSMENT:  68 y.o Male with multiple readmissions due to complications after esophagectomy and J tube placement on 3/10 including, anastomosis leaks, dysphagia, chylothorax, FTT. Now presents with persistent  coughing episodes, SOB, J tube drainage, esophageal anastomotic leak. PMH of DM, HTN, dyslipedemia, CKD, esophageal cancer, IgG kappa MGUS.  Previous Admission 07/28/2023-08/19/2023 3/10 EGD, XI Robotic Assisted Ivor Lewis Esophagectomy, J-tube placement 3/11 Trickle TF initiated 3/12 Surgery increased TF to 40 ml/hr 3/13 Surgery increased TF to 55 ml/hr, transferred to ICU  3/14 Surgery increased TF to 65 ml/hr (goal), bowel regimen added and +BM 3/15 Intubated, Bronch (Thick mucus in trachea suctioned, partially occluding L mainstem, ?aspiration, recurrent false vocal cord irritation with swelling leading to stridor and resp failure) 3/17 Extubated 3/18 Esophogram:  Brisk tracheal aspiration of ingested contrast, into the LEFT mainstem bronchus. SLP eval recommended. Stagnant contrast at diaphragmatic hiatus. No anastomotic leak.  3/19 Diet advanced to Dysphagia 2/Thins, later downgraded to Nectar Thick Liquids 3/20 Transition to Nocturnal TF, family to bring protein shake in from home 3/21 Significant diarrhea overnight, (9 stools in 10 hours), significant J-tube drainage, change to Vivonex with day time feedings 3/24 Diet changed to Dysphagia 2, Thins per SLP 3/26 NPO due to concern for possible esophageal leak 3/27 advanced to Dysphagia 3, thin liquids  3/31 modify TF regimen for d/c 4/1 discharged home Previous Admission 08/19/2023-09/15/2023 4/2 admitted; OR: R VATS, EGD, and esophageal stent placement 4/3 trial Johny Nap TF formula during the day (16 hours), transfusion 4/4 advanced to goal rate; leaking J-tube, TF held  4/5 txr to cardiovascular ICU 4/7 TF re-initiated, transfused  4/9 J-tube replaced by IR 4/10 significant leaking from J-tube; TFs on hold  4/11 PICC placed, TPN initiated, J-tube replaced again,  WOUND VAC 4/12 re-initiate trickles s/p second J-tube replacement 4/13 PICC line replaced 4/14 slowly advance TF goal rate 4/16 TF to goal rate; TPN discontinued 4/17  chylothorax: transition to Vivonex and very low fat diet 4/19 TF reduced due to feelings of fullness/diarrhea 4/21 TF rate increased to 11ml/hr 4/24 embolization of chyle leak 4/25 TF increased to 38ml/hr 4/28 discharged home  Current Admission 5/2 Admitted Viovonex started @ 65 ml/hr, J tube drainage and chylothorax persists persists   RD working remotely, attempted to call patients room with no response. History obtained through chart review.   Patient had esophagectomy and J tube placement 3/10 and has had multiple complications since then requiring multiple readmissions to the hospital.  Patient recently discharged 4/28 after treatment for chylothorax, and esophageal leak s/p esophogeal stent. Patient was discharged home on a low fat TF formula meeting 78% of his calorie and 100% of his protein needs. Patient was to meet his calorie deficit by pureed diet. Will determine how patient was eating by PO on follow up but is still noted patient with poor PO intake.   Patient developed severe coughing episodes along with SOB and had greenish fluid coming from his chest tube. Also had increased bilious J tube drainage within the past couple of days. It was also noted in H&P that patient has not been taking his insulin  and was having CBG's in the 500s.   Patient with previous diagnosis of severe malnutrition however, unable to determine at this time due to exam deferred to follow up and limited nutritional history.  Weight on admission shows continued weight loss. May have to adjust TF regimen on follow up if patient does not increase PO intake. Patient was on Johny Nap previously but did not want to discharge on Johny Nap due to having Viovonex at home.   Patient currently receiving Vionex @ 65 ml/hr and Nutrisource fiber BID. Per RN last BM patient had was 5/1 but unable to determine consistency, patient did not have breakfast or lunch today due to being lethargic. No meals ordered in [employer]  will place room service with assist. Per past RD notes patient is a very picky eater and has refused Ensures and Magic cups in the past. During past admissions family has brought in Premier Protein shakes that patient was consuming.   Patient still with chylothorax, MD continuing MCT oil. Plan for EGD and possible esophageal stent removal Monday. Patients potassium low,MD repleting, monitor lytes and add thiamine  as patient is at risk for refeeding.    Admit weight: 73.9 kg  Current weight: 73.9 kg     Intake/Output Summary (Last 24 hours) at 09/20/2023 1435 Last data filed at 09/20/2023 0700 Gross per 24 hour  Intake 843.91 ml  Output 1670 ml  Net -826.09 ml   Drains/Lines: Chest tube 470 ml x 24 hours - milky green  UOP: 1.2L x 24 hours   Average Meal Intake: No meals recorded   Nutritionally Relevant Medications: Scheduled Meds:  amLODipine   10 mg Per Tube Daily   [START ON 09/21/2023] atorvastatin   20 mg Per Tube Daily   Chlorhexidine  Gluconate Cloth  6 each Topical Daily   enoxaparin  (LOVENOX ) injection  40 mg Subcutaneous Q24H   feeding supplement (PROSource TF20)  60 mL Per Tube BID   fiber  1 packet Oral BID   [START ON 09/21/2023] folic acid   1 mg Per Tube Daily   Gerhardt's butt cream   Topical TID   insulin  aspart  0-5  Units Subcutaneous QHS   insulin  aspart  0-9 Units Subcutaneous TID WC   insulin  glargine-yfgn  20 Units Subcutaneous Daily   medium chain triglycerides   15 mL Per Tube TID   metoprolol  tartrate  25 mg Per Tube BID   multivitamin with minerals  1 tablet Per Tube Daily   pantoprazole   40 mg Oral Daily   potassium chloride   40 mEq Per Tube Daily   saccharomyces boulardii  250 mg Oral BID   sodium chloride  flush  10-40 mL Intracatheter Q12H   tamsulosin   0.4 mg Oral Daily   thiamine   100 mg Per Tube Daily   Continuous Infusions:  Vivonex RTF 1,000 mL (09/20/23 0911)    Labs Reviewed: Potassium 3.4, BUN 39, Creatinine 1.39, Calcium  8, AP 131, Albumin   1.7, Total protein, GFR 56, Lactic acid 2.2, CBG ranges from 331-409 mg/dL over the last 24 hours HgbA1c 7.1  NUTRITION - FOCUSED PHYSICAL EXAM:  - deferred to follow up.   Diet Order:   Diet Order             DIET - DYS 1 Room service appropriate? Yes with Assist; Fluid consistency: Thin  Diet effective now                   EDUCATION NEEDS:   Not appropriate for education at this time  Skin:  Skin Assessment: Skin Integrity Issues: Incisions: R chest tube Other: MASD around J-tube  Last BM:  5/1 - Per RN  Height:   Ht Readings from Last 1 Encounters:  09/19/23 5\' 10"  (1.778 m)    Weight:   Wt Readings from Last 1 Encounters:  09/19/23 73.9 kg    Ideal Body Weight:  75.5 kg  BMI:  There is no height or weight on file to calculate BMI.  Estimated Nutritional Needs:   Kcal:  2200-2400 kcal  Protein:  115-130 gm  Fluid:  >2L/day   Frederik Jansky, RD Registered Dietitian  See Amion for more information

## 2023-09-20 NOTE — Evaluation (Signed)
 Occupational Therapy Evaluation Patient Details Name: Seth Burch MRN: [id number] DOB: 12/23/1955 Today's Date: 09/20/2023   History of Present Illness   68 y.o. male presents to [facility] 09/19/23 after follow-up with Dr.Lightfoot with failure to thrive, low O2 sats/SOB and milky-output of chest tube. Pt with chylothorax, esophageal anastomotic leak, and J tube bilious drainage. Plan for EGD w/ possible stent removal Monday. Extensive prior admits 3/10-4/1 and 4/1-4/28. 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 Patient reoriented, but taking increased time to state name and birthday. Neuro assessment performed. Strength and sensation intact in all extremities, no slurred speech or facial drift. By end of session patient able to state month and year, and with increased repetition able to state he was in [facility]. RR remaining high. RN informed immediately after assessment. OT will continue to assess ADLs and functional mobility, but given prior level would recommend intensive rehab > 3 hours.      If plan is discharge home, recommend the following:   A little help with walking and/or transfers;A lot of help with bathing/dressing/bathroom;Assistance with cooking/housework;Direct supervision/assist for medications management;Direct supervision/assist for financial management;Assist for transportation;Help with stairs or ramp for entrance;Supervision due to cognitive status     Functional Status Assessment   Patient has had a recent decline in their functional status and demonstrates the ability to make significant improvements in function in a reasonable and  predictable amount of time.     Equipment Recommendations   Other (comment) (defer to next venue)     Recommendations for Other Services   Rehab consult     Precautions/Restrictions   Precautions Precautions: Fall Precaution/Restrictions Comments: L UQ jejunostomy, R chest tube Restrictions Weight Bearing Restrictions Per Provider Order: No     Mobility Bed Mobility Overal bed mobility: Needs Assistance             General bed mobility comments: deferred due to confusion    Transfers                   General transfer comment: deferred due to confusion      Balance Overall balance assessment: Needs assistance, Mild deficits observed, not formally tested                                         ADL either performed or assessed with clinical judgement   ADL Overall ADL's : Needs assistance/impaired                                       General ADL Comments: Session limited due to significant confusion, will assess ADL management at next session     Vision   Additional Comments: will continue to assess     Perception Perception: Not tested       Praxis Praxis: Not tested       Pertinent Vitals/Pain Pain Assessment Pain Assessment: Faces Faces Pain Scale: Hurts a little bit Pain Location: abdomen Pain Descriptors / Indicators: Discomfort Pain Intervention(s): Limited  activity within patient's tolerance, Monitored during session     Extremity/Trunk Assessment Upper Extremity Assessment Upper Extremity Assessment: Generalized weakness;Difficult to assess due to impaired cognition   Lower Extremity Assessment Lower Extremity Assessment: Defer to PT evaluation   Cervical / Trunk Assessment Cervical / Trunk Assessment: Normal   Communication Communication Communication: Impaired Factors Affecting Communication: Difficulty expressing self   Cognition Arousal: Lethargic Behavior During Therapy:  Anxious, Flat affect Cognition: Cognition impaired   Orientation impairments: Place, Situation, Time Awareness: Online awareness impaired, Intellectual awareness impaired Memory impairment (select all impairments): Working [employer] fast streamer, Short-term memory Attention impairment (select first level of impairment): Focused attention Executive functioning impairment (select all impairments): Initiation, Organization, Sequencing, Reasoning, Problem solving OT - Cognition Comments: Patient awoken upon OT arrival, OT introducing self, patient after long prolonged pause stating, "I dont know whats going on or where I am." Respiratory rate in upper 30s with max RR noted at 38. Patient reoriented, but taking increased time to state name and birthday. Neuro assessment performed. Strength and sensation intact in all extremities, no slurred speech or facial drift. By end of session patient able to state month and year, and with increased repetition able to state he was in [facility]. RR remaining high. RN informed immediately after assessment.                 Following commands: Impaired Following commands impaired: Follows one step commands inconsistently, Follows one step commands with increased time     Cueing  General Comments   Cueing Techniques: Verbal cues;Gestural cues;Tactile cues;Visual cues  RR up to 38, other VSS on 2L   Exercises     Shoulder Instructions      Home Living Family/patient expects to be discharged to:: Private residence Living Arrangements: Spouse/significant other Available Help at Discharge: Family;Available 24 hours/day Type of Home: House Home Access: Stairs to enter [employer] of Steps: 4 Entrance Stairs-Rails: Right;Left;Can reach both Home Layout: One level     Bathroom Shower/Tub: Producer, television/film/video: Standard     Home Equipment: Pharmacist, hospital (2 wheels);BSC/3in1          Prior Functioning/Environment  Prior Level of Function : Independent/Modified Independent;Driving (on disability)             Mobility Comments: Ind with no AD prior to initial hospitalization. RW since prior admission with increased assistance needed upon return home for transfers and gait ADLs Comments: typically independent in all ADLs, IADLs and driving. Needing increased assistance with ADLs over the few days the pt was home    OT Problem List: Decreased strength;Decreased range of motion;Decreased activity tolerance;Impaired balance (sitting and/or standing);Decreased coordination;Decreased cognition;Decreased safety awareness;Decreased knowledge of use of DME or AE;Decreased knowledge of precautions;Cardiopulmonary status limiting activity;Pain   OT Treatment/Interventions: Self-care/ADL training;Therapeutic exercise;Energy conservation;DME and/or AE instruction;Manual therapy;Cognitive remediation/compensation;Patient/family education;Balance training;Modalities;Therapeutic activities      OT Goals(Current goals can be found in the care plan section)   Acute Rehab OT Goals Patient Stated Goal: unable to state OT Goal Formulation: Patient unable to participate in goal setting Time For Goal Achievement: 10/04/23 Potential to Achieve Goals: Good ADL Goals Pt Will Perform Lower Body Bathing: with modified independence;sit to/from stand;sitting/lateral leans Pt Will Perform Lower Body Dressing: with modified independence;sit to/from stand;sitting/lateral leans Pt Will Transfer to Toilet: with modified independence;ambulating;regular height toilet Pt Will Perform Toileting - Clothing Manipulation and hygiene: with modified independence;sitting/lateral leans;sit to/from stand Additional ADL Goal #1: Patient will be able to  complete functional task in standing for 5 minutes prior to fatigue in order to promote increased activity tolerance.   OT Frequency:  Min 2X/week    Co-evaluation              AM-PAC  OT "6 Clicks" Daily Activity     Outcome Measure Help from another person eating meals?: A Little Help from another person taking care of personal grooming?: A Little Help from another person toileting, which includes using toliet, bedpan, or urinal?: A Lot Help from another person bathing (including washing, rinsing, drying)?: A Lot Help from another person to put on and taking off regular upper body clothing?: A Little Help from another person to put on and taking off regular lower body clothing?: A Lot 6 Click Score: 15   End of Session Equipment Utilized During Treatment: Oxygen Nurse Communication: Mobility status;Other (comment) (Confusion)  Activity Tolerance: Other (comment) (Confusion) Patient left: in bed;with call bell/phone within reach;with bed alarm set  OT Visit Diagnosis: Unsteadiness on feet (R26.81);Muscle weakness (generalized) (M62.81);Other symptoms and signs involving cognitive function;Pain                Time: 4098-1191 OT Time Calculation (min): 14 min Charges:  OT General Charges $OT Visit: 1 Visit OT Evaluation $OT Eval Moderate Complexity: 1 Mod  Mollie Anger E. Darrow Barreiro, OTR/L Acute Rehabilitation Services [phone]   Vincent Greek 09/20/2023, 4:04 PM

## 2023-09-20 NOTE — Progress Notes (Addendum)
 Patient verbalized that he is afraid of taking PO medications because of coughing and also mentioned has trouble swallowing so informed to the pharmacist, route of medications changed, medications given through the J tube.   He had mini express chest tube drainage bag, changed to [place] by PA at bedside, no any leakage noticed, will continue to monitor.   Patient has strong productive cough, refused for PRN cough medication.

## 2023-09-20 NOTE — Progress Notes (Addendum)
      [address]       [name] [address]             [phone]    Subjective:  Patient having pain across his chest this morning.  He feels the cough medicine made his cough worse.  Denies N/V.  No diarrhea since admission  Objective: Vital signs in last 24 hours: Temp:  [97.7 F (36.5 C)-98.6 F (37 C)] 97.9 F (36.6 C) (05/03 0258) Pulse Rate:  [92-107] 96 (05/03 0523) Cardiac Rhythm: Normal sinus rhythm (05/02 1940) Resp:  [14-33] 21 (05/03 0523) BP: (113-125)/(65-75) 124/66 (05/03 0523) SpO2:  [85 %-98 %] 94 % (05/03 0523) Weight:  [73.9 kg] 73.9 kg (05/02 1114)  Intake/Output from previous day: 05/02 0701 - 05/03 0700 In: 843.9 [NG/GT:843.9] Out: 1670 [Urine:1200; Chest Tube:470]  General appearance: alert, cooperative, no distress, and malnourished Heart: regular rate and rhythm Lungs: clear to auscultation bilaterally Abdomen: soft, non-tender; bowel sounds normal; no masses,  no organomegaly and bilious J Tube drainage persists Extremities: extremities normal, atraumatic, no cyanosis or edema Wound: clean and dry  Lab Results: Recent Labs    09/19/23 1524 09/20/23 0445  WBC 10.3 10.6*  HGB 7.4* 7.3*  HCT 22.9* 23.3*  PLT 388 415*   BMET:  Recent Labs    09/19/23 1524 09/20/23 0445  NA 137 135  K 3.4* 3.4*  CL 104 104  CO2 21* 24  GLUCOSE 504* 365*  BUN 37* 39*  CREATININE 1.61* 1.39*  CALCIUM  8.2* 8.0*    PT/INR: No results for input(s): "LABPROT", "INR" in the last 72 hours. 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 CXR overall looks okay.... will connect to Pleurovac for continued drainage... continue MCT oil  Severe Protein Calorie Malnutrition- on dysphagia 1 diet with very poor oral intake, tube feeds running currently at 65 ml/hr Cough- Tussinex helped, continue prn DM- sugars significantly elevated... increase Semeglee to 20 units daily, continue SSIP will continue to adjust as needed J Tube Bilious drainage.. nursing placed wound care consult yesterday, continue diligent wound care per their recommendations Deconditioning- severe, exacerbated but overall weakness.... PT/OT consults as ordered Hypokalemia- mild, due to poor oral intake, chylothorax and tube feedings.. supplement today Anemia of chronic disease, H/H stable at 7.3  Plan: Dr. Deloise Ferries to perform EGD with possible stent removal on Monday, continue current care   LOS: 1 day    [name], PA-C 09/20/2023   Chart reviewed, patient examined, agree with above.  His main complaint is of yellow thick sputum that is difficult to cough out and chokes him. CXR looks good. No sign of pneumonia. He is afebrile with normal WBC ct. Admitted for EGD Monday.

## 2023-09-20 NOTE — Evaluation (Signed)
 Physical Therapy Evaluation Patient Details Name: Seth Burch MRN: [id number] DOB: [birth year] Today's Date: 09/20/2023  History of Present Illness  68 y.o. male presents to [facility] 09/19/23 after follow-up with Dr.Lightfoot with failure to thrive, low O2 sats/SOB and milky-output of chest tube. Pt with chylothorax, esophageal anastomotic leak, and J tube bilious drainage. Plan for EGD w/ possible stent removal Monday. Extensive prior admits 3/10-4/1 and 4/1-4/28. 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 Pt has had recent lengthy hospitalizations and was only home for a few days before returning to the hospital. At home, pt was using a RW for mobility, however, was requiring increased assistance to stand and ambulate from family. In today's session, pt was limited by pain in abdomen and feeling poor. He was able to move from supine/sit with MinA to raise trunk. Once in sitting, pt was able to sit upright for 1 min before needing to return to supine. Anticipate that with continued therapy and pain management, pt will progress well. Pt has 24/7 physical assist available from family at home. Recommending >3hrs post acute rehab to work towards independence with mobility and reduce hospital readmission rate. Pt with decreased activity tolerance, strength, and mobility. Pt would benefit from acute skilled PT to address functional impairments.Acute PT to follow.         If plan is discharge home, recommend the following: Assistance with cooking/housework;Assist for transportation;Help with stairs or ramp for entrance;A little help with walking and/or transfers;A little help with bathing/dressing/bathroom   Can travel by private vehicle    TBA    Equipment Recommendations None recommended by PT  Recommendations  for Other Services  Rehab consult    Functional Status Assessment Patient has had a recent decline in their functional status and demonstrates the ability to make significant improvements in function in a reasonable and predictable amount of time.     Precautions / Restrictions Precautions Precautions: Fall Precaution/Restrictions Comments: L UQ jejunostomy, R chest tube Restrictions Weight Bearing Restrictions Per Provider Order: No      Mobility  Bed Mobility Overal bed mobility: Needs Assistance Bed Mobility: Rolling, Sidelying to Sit, Sit to Supine Rolling: Modified independent (Device/Increase time) Sidelying to sit: Min assist, HOB elevated   Sit to supine: Contact guard assist, HOB elevated   General bed mobility comments: slight assist to raise trunk    Transfers    General transfer comment: deferred 2/2 pain      Balance Overall balance assessment: Needs assistance, Mild deficits observed, not formally tested Sitting-balance support: Bilateral upper extremity supported, Feet supported Sitting balance-Leahy Scale: Fair Sitting balance - Comments: Able to sit for 1 min before requesting to return to supine due to discomfort       Pertinent Vitals/Pain Pain Assessment Pain Assessment: Faces Faces Pain Scale: Hurts whole lot Pain Location: abdomen Pain Descriptors / Indicators: Discomfort Pain Intervention(s): Limited activity within patient's tolerance, Monitored during session, Repositioned    Home Living Family/patient expects to be discharged to:: Private residence Living Arrangements: Spouse/significant other Available Help at Discharge: Family;Available 24 hours/day Type of Home: House Home Access: Stairs to enter Entrance Stairs-Rails: Right;Left;Can reach both Entrance Stairs-Number of Steps: 4   Home Layout: One level Home Equipment: Pharmacist, hospital (2 wheels);BSC/3in1      Prior Function Prior Level of Function :  Independent/Modified  Independent;Driving (on disability)    Mobility Comments: Ind with no AD prior to initial hospitalization. RW since prior admission with increased assistance needed upon return home for transfers and gait ADLs Comments: typically independent in all ADLs, IADLs and driving. Needing increased assistance with ADLs over the few days the pt was home     Extremity/Trunk Assessment   Upper Extremity Assessment Upper Extremity Assessment: Defer to OT evaluation    Lower Extremity Assessment Lower Extremity Assessment: Generalized weakness    Cervical / Trunk Assessment Cervical / Trunk Assessment: Normal  Communication   Communication Communication: No apparent difficulties    Cognition Arousal: Alert Behavior During Therapy: WFL for tasks assessed/performed   PT - Cognitive impairments: No apparent impairments    Following commands: Intact       Cueing Cueing Techniques: Verbal cues     General Comments General comments (skin integrity, edema, etc.): VSS on 2L     PT Assessment Patient needs continued PT services  PT Problem List Decreased activity tolerance;Decreased balance;Decreased mobility;Decreased knowledge of use of DME;Cardiopulmonary status limiting activity;Decreased strength       PT Treatment Interventions DME instruction;Gait training;Stair training;Functional mobility training;Therapeutic activities;Therapeutic exercise;Balance training;Neuromuscular re-education;Patient/family education    PT Goals (Current goals can be found in the Care Plan section)  Acute Rehab PT Goals Patient Stated Goal: to get more rehab to get stronger and more independent PT Goal Formulation: With patient/family Time For Goal Achievement: 10/04/23 Potential to Achieve Goals: Good    Frequency Min 3X/week        AM-PAC PT "6 Clicks" Mobility  Outcome Measure Help needed turning from your back to your side while in a flat bed without using bedrails?: A  Little Help needed moving from lying on your back to sitting on the side of a flat bed without using bedrails?: A Little Help needed moving to and from a bed to a chair (including a wheelchair)?: A Little Help needed standing up from a chair using your arms (e.g., wheelchair or bedside chair)?: A Little Help needed to walk in hospital room?: A Lot Help needed climbing 3-5 steps with a railing? : A Lot 6 Click Score: 16    End of Session Equipment Utilized During Treatment: Oxygen Activity Tolerance: Patient limited by pain;Patient limited by fatigue Patient left: in bed;with call bell/phone within reach;with family/visitor present Nurse Communication: Mobility status PT Visit Diagnosis: Other abnormalities of gait and mobility (R26.89);Unsteadiness on feet (R26.81);Muscle weakness (generalized) (M62.81)    Time: 8295-6213 PT Time Calculation (min) (ACUTE ONLY): 23 min   Charges:   PT Evaluation $PT Eval Low Complexity: 1 Low   PT General Charges $$ ACUTE PT VISIT: 1 Visit       Orysia Blas, PT, DPT Secure Chat Preferred  Rehab Office [phone]   Alissa April Adela Ades 09/20/2023, 11:33 AM

## 2023-09-21 LAB — GLUCOSE, CAPILLARY
Glucose-Capillary: 356 mg/dL — ABNORMAL HIGH (ref 70–99)
Glucose-Capillary: 312 mg/dL — ABNORMAL HIGH (ref 70–99)
Glucose-Capillary: 290 mg/dL — ABNORMAL HIGH (ref 70–99)
Glucose-Capillary: 222 mg/dL — ABNORMAL HIGH (ref 70–99)

## 2023-09-21 LAB — POTASSIUM: Potassium: 3.6 mmol/L (ref 3.5–5.1)

## 2023-09-21 LAB — MAGNESIUM: Magnesium: 1.9 mg/dL (ref 1.7–2.4)

## 2023-09-21 LAB — PHOSPHORUS: Phosphorus: 1.6 mg/dL — ABNORMAL LOW (ref 2.5–4.6)

## 2023-09-21 MED ORDER — FAMOTIDINE 20 MG PO TABS
20.0000 mg | ORAL_TABLET | Freq: Every day | ORAL | Status: DC
  Administered 2023-09-22 – 2023-10-14 (×23): 20 mg
  Filled 2023-09-21 (×23): qty 1

## 2023-09-21 MED ORDER — POTASSIUM & SODIUM PHOSPHATES 280-160-250 MG PO PACK
2.0000 | PACK | ORAL | Status: AC
  Administered 2023-09-21 – 2023-09-22 (×4): 2
  Filled 2023-09-21 (×4): qty 2

## 2023-09-21 MED ORDER — GUAIFENESIN-DM 100-10 MG/5ML PO SYRP: 5.0000 mL | ORAL_SOLUTION | ORAL | Status: DC | PRN

## 2023-09-21 MED ORDER — GUAIFENESIN-DM 100-10 MG/5ML PO SYRP
5.0000 mL | ORAL_SOLUTION | ORAL | Status: AC
  Administered 2023-09-21 – 2023-09-22 (×6): 5 mL
  Filled 2023-09-21 (×7): qty 5

## 2023-09-21 MED ORDER — INSULIN GLARGINE-YFGN 100 UNIT/ML ~~LOC~~ SOLN
30.0000 [IU] | Freq: Every day | SUBCUTANEOUS | Status: DC
  Administered 2023-09-21: 30 [IU] via SUBCUTANEOUS
  Filled 2023-09-21 (×2): qty 0.3

## 2023-09-21 MED ORDER — VIVONEX RTF PO LIQD
1560.0000 mL | ORAL | Status: AC
  Administered 2023-09-21: 1000 mL via ORAL
  Filled 2023-09-21: qty 1560

## 2023-09-21 NOTE — Evaluation (Signed)
 Clinical/Bedside Swallow Evaluation Patient Details  Name: Seth Burch MRN: [id number] Date of Birth: March 17, 1956  Today's Date: 09/21/2023 Time: SLP Start Time (ACUTE ONLY): 1154 SLP Stop Time (ACUTE ONLY): 1200 SLP Time Calculation (min) (ACUTE ONLY): 6 min  Past Medical History:  Past Medical History:  Diagnosis Date   Arthritis    Cancer (HCC)    Chronic kidney disease    Diabetes mellitus (HCC)    Dyslipidemia    Hypertension    Past Surgical History:  Past Surgical History:  Procedure Laterality Date   ESOPHAGOGASTRODUODENOSCOPY N/A 07/28/2023   Procedure: EGD (ESOPHAGOGASTRODUODENOSCOPY);  Surgeon: Hilarie Lovely, MD;  Location: [facility] OR;  Service: Thoracic;  Laterality: N/A;   ESOPHAGOGASTRODUODENOSCOPY N/A 08/20/2023   Procedure: EGD (ESOPHAGOGASTRODUODENOSCOPY);  Surgeon: Hilarie Lovely, MD;  Location: [facility] OR;  Service: Thoracic;  Laterality: N/A;   ESOPHAGOGASTRODUODENOSCOPY (EGD) WITH PROPOFOL  N/A 04/04/2023   Procedure: ESOPHAGOGASTRODUODENOSCOPY (EGD) WITH PROPOFOL ;  Surgeon: Alvis Jourdain, MD;  Location: [facility] ENDOSCOPY;  Service: Gastroenterology;  Laterality: N/A;   IR EMBO ART  VEN HEMORR LYMPH EXTRAV  INC GUIDE ROADMAPPING  09/11/2023   IR FLUORO GUIDED NEEDLE PLC ASPIRATION/INJECTION LOC  09/11/2023   IR IMAGING GUIDED PORT INSERTION  04/09/2023   IR LYMPHANGIOGRAM PEL/ABD UNI LEFT  09/11/2023   IR REPLC GASTRO/COLONIC TUBE PERCUT W/FLUORO  08/27/2023   IR REPLC GASTRO/COLONIC TUBE PERCUT W/FLUORO  08/29/2023   IR US  GUIDANCE  09/11/2023   IR US  GUIDANCE  09/11/2023   IR US  GUIDE VASC ACCESS LEFT  09/11/2023   RADIOLOGY WITH ANESTHESIA N/A 09/11/2023   Procedure: RADIOLOGY WITH ANESTHESIA;  Surgeon: Myrlene Asper, DO;  Location: [facility] OR;  Service: Anesthesiology;  Laterality: N/A;  Chyle leak embolization   UPPER ESOPHAGEAL ENDOSCOPIC ULTRASOUND (EUS) N/A 04/04/2023   Procedure: UPPER ESOPHAGEAL ENDOSCOPIC ULTRASOUND (EUS);  Surgeon: Alvis Jourdain, MD;  Location: [facility]  END[facility];  Service: Gastroenterology;  Laterality: N/A;   VIDEO ASSISTED THORACOSCOPY (VATS)/DECORTICATION Right 08/20/2023   Procedure: VIDEO ASSISTED THORACOSCOPY (VATS)/DECORTICATION;  Surgeon: Hilarie Lovely, MD;  Location: [facility] OR;  Service: Thoracic;  Laterality: Right;   HPI:  Seth Burch is a 69 yo male admitted with leak of esophageal anastomosis.  Pt has hx of esophageal ca with esophagectomy 07/28/23 and has had complicated recovery. CXR 5/3: " Improved lung volumes and right lung base ventilation.  Veiling opacity at the right lung base regressed."    Assessment / Plan / Recommendation  Clinical Impression  Pt presents with a mild oral dypshagia 2/2 xerostomia.  Dorsal lingual surface with brown coating.  Pt with dried brown secretions on lower lip as well. Encourage regular oral care to help improve appearance of tongue and improve oral moisure.  Pt did not want POs today.  He says he is having trouble swallowing.  He endorses feeling of stasis; denies regurgitation. He was agreeable to sips of water  only.  He accepted water  by spoon x1 and straw x1.  Increased RR/WOB noted following swallow.  He reports coughing with POs.  He endorses odynophagia 2/2 xerostomia.  Offered liquid diet for increased comfort.  Pt states he doesn't think all these diets are helping him and he does not want to make changes.  Pt declined puree trials.  SLP cannot make a recommendation regarding safetly of solid PO intake. Will defer diet determination to medical team.  Consider medications by alternate means if pt is having difficulty swallowing. Orders discontinued by provider, please reconsult if pt has any further ST  needs.   SLP Visit Diagnosis: Dysphagia, oral phase (R13.11)    Aspiration Risk    Unable to assess   Diet Recommendation  (Diet decision deferred to medical team)         Other  Recommendations Oral Care Recommendations: Oral care QID    Recommendations for follow up therapy are  one component of a multi-disciplinary discharge planning process, led by the attending physician.  Recommendations may be updated based on patient status, additional functional criteria and insurance authorization.  Follow up Recommendations No SLP follow up      Assistance Recommended at Discharge  N/A  Functional Status Assessment  (orders discontinued)  Frequency and Duration  (Orders discontinued)          Prognosis Prognosis for improved oropharyngeal function:  (orders discontinued)      Swallow Study   General HPI: Seth Burch is a 68 yo male admitted with leak of esophageal anastomosis.  Pt has hx of esophageal ca with esophagectomy 07/28/23 and has had complicated recovery. CXR 5/3: " Improved lung volumes and right lung base ventilation.  Veiling opacity at the right lung base regressed." Type of Study: Bedside Swallow Evaluation Previous Swallow Assessment: None Diet Prior to this Study: Dysphagia 1 (pureed);Thin liquids (Level 0) Temperature Spikes Noted: No History of Recent Intubation: No Behavior/Cognition: Alert;Cooperative Oral Cavity Assessment: Dry Oral Care Completed by SLP: No Oral Cavity - Dentition: Adequate natural dentition Patient Positioning: Upright in bed Baseline Vocal Quality: Normal    Oral/Motor/Sensory Function   Not assessed. No focal asymmetry observed  Ice Chips Ice chips: Not tested   Thin Liquid Thin Liquid: Within functional limits Presentation: Spoon;Straw    Nectar Thick Nectar Thick Liquid: Not tested   Honey Thick Honey Thick Liquid: Not tested   Puree Puree: Not tested Other Comments: pt declines   Solid     Solid: Not tested      Elester Grim, MA, CCC-SLP Acute Rehabilitation Services Office: [phone] 09/21/2023,12:20 PM

## 2023-09-21 NOTE — Progress Notes (Signed)
 Patient had a coughing episode after taking po medication for cough, was tacypneic and dyspneic, RR was in 30s-40s, spo2 dropped to 80%-85%, 02 increased to HFNC 7 liters, spo2 has improved but patient is still feeling the same, also has chest pain, PRN analgesic given.   Informed to the PA.   09/21/23 0936  Vitals  Pulse Rate (!) 102  Pulse Rate Source Monitor  ECG Heart Rate (!) 103  Resp (!) 40  MEWS COLOR  MEWS Score Color Red  Oxygen Therapy  SpO2 (!) 85 %  O2 Device HFNC  O2 Flow Rate (L/min) 4 L/min  MEWS Score  MEWS Temp 0  MEWS Systolic 0  MEWS Pulse 1  MEWS RR 3  MEWS LOC 0  MEWS Score 4

## 2023-09-21 NOTE — Progress Notes (Signed)
 Patient's port has clotted off, IV consult has been placed and lab contacted to draw this morning labs.

## 2023-09-21 NOTE — Progress Notes (Addendum)
      [address]       Seth Burch [id number]             [phone]       Subjective:  Patient about the same.  He does have some mild abdominal pain this morning.  Denies N/V.  Right sided chest discomfort is improved.  He continues to have minimal oral intake. He will take liquids, but there is a note from nursing he is afraid to take pills, due to coughing episodes.  Objective: Vital signs in last 24 hours: Temp:  [97.9 F (36.6 C)-98.7 F (37.1 C)] 98.7 F (37.1 C) (05/03 1603) Pulse Rate:  [99-103] 101 (05/03 1603) Cardiac Rhythm: Ventricular tachycardia (05/03 2042) Resp:  [19-33] 24 (05/03 1603) BP: (118-133)/(70-89) 118/70 (05/03 1603) SpO2:  [95 %-97 %] 97 % (05/03 1603)  Intake/Output from previous day: 05/03 0701 - 05/04 0700 In: 100 [P.O.:100] Out: 1330 [Urine:1100; Chest Tube:230]  General appearance: alert, cooperative, and no distress.Seth Burch appears malnourished Heart: regular rate and rhythm and tachy Lungs: clear to auscultation bilaterally Abdomen: soft, mildly tender, non-distended Extremities: extremities normal, atraumatic, no cyanosis or edema Wound: continued biliary drainage around J Tube, right chest wounds clean and dry  Lab Results: Recent Labs    09/19/23 1524 09/20/23 0445  WBC 10.3 10.6*  HGB 7.4* 7.3*  HCT 22.9* 23.3*  PLT 388 415*   BMET:  Recent Labs    09/19/23 1524 09/20/23 0445  NA 137 135  K 3.4* 3.4*  CL 104 104  CO2 21* 24  GLUCOSE 504* 365*  BUN 37* 39*  CREATININE 1.61* 1.39*  CALCIUM  8.2* 8.0*    PT/INR: No results for input(s): "LABPROT", "INR" in the last 72 hours. ABG    Component Value Date/Time   PHART 7.4 07/29/2023 0514   HCO3 25.8 08/02/2023 2006   TCO2 17 (L) 08/19/2023 2022   ACIDBASEDEF 1.0 08/02/2023 1506   O2SAT 94.5 08/02/2023 2006   CBG (last 3)  Recent Labs    09/20/23 1635 09/20/23 2116 09/21/23 0616  GLUCAP 226* 264* 356*    Assessment/Plan:  S/P Esophagectomy,  complicated by Anastomotic Leak S/p Stent Placement 4/2, Chylothorax S/P Coli/glue embolization 4/24 Chylothorax- persists, CT output has 230 cc leave in place Severe Protein Calorid Malnutrition- on dysphagia 1 diet, oral intake remains poor other than liquids patient not really eating, currently on tube feedings at 65 ml/hr Cough- he states the Tussionex did not help, will stop add Tessalon prn DM- sugars significantly elevated, increase Semeglee to 30 units, SSIP  J Tube Bilious Drainage- continue diligent wound care Deconditioning- mainly due to weakness, continue PT/OT Labs pending.. port clotted? However IV team consulted and this is now working.. in process Constipation- will hold fiber supplements, patient has not had BM in 2 days... he had diarrhea previously 1-2 times daily... monitor  Dispo- patient stable, for EGD tomorrow, possible stent placement, will discuss what time to stop tube feedings with Dr. Sherene Dilling, NPO at midnight   LOS: 2 days   Seth Kasal, PA-C 09/21/2023  Agree with above. He is still coughing up yellowish sputum that is thick and getting choked on it. CXR looked fine yesterday. Robitusin with guaifenesin added to see if it will loosen secretions. Planning EGD and stent removal tomorrow by [facility].

## 2023-09-21 NOTE — Progress Notes (Signed)
 Inpatient Rehab Admissions Coordinator Note:   Per therapy patient was screened for CIR candidacy by Jia Dottavio Annell Barrow, CCC-SLP. At this time, pt is pain limited and has not yet attempted transfers. Pt may have potential to progress to becoming a potential CIR candidate. CIR admissions team will follow to monitor for progress and participation with therapies. A consult order will be placed if pt appears to be an appropriate candidate.   Artemus Larsen, MS, CCC-SLP Admissions Coordinator [phone] 09/21/23 9:00 AM

## 2023-09-22 ENCOUNTER — Encounter (HOSPITAL_COMMUNITY): Payer: Self-pay | Admitting: Thoracic Surgery (Cardiothoracic Vascular Surgery)

## 2023-09-22 ENCOUNTER — Inpatient Hospital Stay (HOSPITAL_COMMUNITY): Admitting: Certified Registered Nurse Anesthetist

## 2023-09-22 ENCOUNTER — Inpatient Hospital Stay (HOSPITAL_COMMUNITY)

## 2023-09-22 ENCOUNTER — Encounter (HOSPITAL_COMMUNITY)
Admission: AD | Disposition: A | Discharge status: Home Health | Payer: Self-pay | Source: Ambulatory Visit | Attending: Thoracic Surgery (Cardiothoracic Vascular Surgery)

## 2023-09-22 DIAGNOSIS — I129 Hypertensive chronic kidney disease with stage 1 through stage 4 chronic kidney disease, or unspecified chronic kidney disease: Secondary | ICD-10-CM | POA: Diagnosis not present

## 2023-09-22 DIAGNOSIS — E1122 Type 2 diabetes mellitus with diabetic chronic kidney disease: Secondary | ICD-10-CM | POA: Diagnosis not present

## 2023-09-22 DIAGNOSIS — N183 Chronic kidney disease, stage 3 unspecified: Secondary | ICD-10-CM

## 2023-09-22 DIAGNOSIS — T85598A Other mechanical complication of other gastrointestinal prosthetic devices, implants and grafts, initial encounter: Secondary | ICD-10-CM | POA: Diagnosis not present

## 2023-09-22 HISTORY — PX: ESOPHAGOGASTRODUODENOSCOPY: SHX5428

## 2023-09-22 HISTORY — PX: PEG PLACEMENT: SHX5437

## 2023-09-22 LAB — BASIC METABOLIC PANEL WITH GFR
Anion gap: 8 (ref 5–15)
BUN: 44 mg/dL — ABNORMAL HIGH (ref 8–23)
CO2: 22 mmol/L (ref 22–32)
Calcium: 7.6 mg/dL — ABNORMAL LOW (ref 8.9–10.3)
Chloride: 108 mmol/L (ref 98–111)
Creatinine, Ser: 1.3 mg/dL — ABNORMAL HIGH (ref 0.61–1.24)
GFR, Estimated: >60 mL/min (ref >60)
Glucose, Bld: 162 mg/dL — ABNORMAL HIGH (ref 70–99)
Potassium: 4 mmol/L (ref 3.5–5.1)
Sodium: 138 mmol/L (ref 135–145)

## 2023-09-22 LAB — CBC
HCT: 22.6 % — ABNORMAL LOW (ref 39.0–52.0)
Hemoglobin: 7 g/dL — ABNORMAL LOW (ref 13.0–17.0)
MCH: 27 pg (ref 26.0–34.0)
MCHC: 31 g/dL (ref 30.0–36.0)
MCV: 87.3 fL (ref 80.0–100.0)
Platelets: 371 10*3/uL (ref 150–400)
RBC: 2.59 MIL/uL — ABNORMAL LOW (ref 4.22–5.81)
RDW: 16.6 % — ABNORMAL HIGH (ref 11.5–15.5)
WBC: 11.2 10*3/uL — ABNORMAL HIGH (ref 4.0–10.5)
nRBC: 0 % (ref 0.0–0.2)

## 2023-09-22 LAB — SURGICAL PCR SCREEN
MRSA, PCR: NEGATIVE
Staphylococcus aureus: NEGATIVE

## 2023-09-22 LAB — GLUCOSE, CAPILLARY
Glucose-Capillary: 158 mg/dL — ABNORMAL HIGH (ref 70–99)
Glucose-Capillary: 170 mg/dL — ABNORMAL HIGH (ref 70–99)
Glucose-Capillary: 153 mg/dL — ABNORMAL HIGH (ref 70–99)
Glucose-Capillary: 135 mg/dL — ABNORMAL HIGH (ref 70–99)
Glucose-Capillary: 128 mg/dL — ABNORMAL HIGH (ref 70–99)
Glucose-Capillary: 155 mg/dL — ABNORMAL HIGH (ref 70–99)
Glucose-Capillary: 173 mg/dL — ABNORMAL HIGH (ref 70–99)

## 2023-09-22 LAB — MAGNESIUM: Magnesium: 1.8 mg/dL (ref 1.7–2.4)

## 2023-09-22 LAB — PHOSPHORUS: Phosphorus: 3.5 mg/dL (ref 2.5–4.6)

## 2023-09-22 SURGERY — EGD (ESOPHAGOGASTRODUODENOSCOPY)
Anesthesia: General | Site: Esophagus

## 2023-09-22 MED ORDER — FENTANYL CITRATE (PF) 250 MCG/5ML IJ SOLN
INTRAMUSCULAR | Status: DC | PRN
  Administered 2023-09-22: 50 ug via INTRAVENOUS

## 2023-09-22 MED ORDER — ACETAMINOPHEN 325 MG PO TABS
650.0000 mg | ORAL_TABLET | Freq: Four times a day (QID) | ORAL | Status: DC | PRN
Start: 2023-09-22 — End: 2023-10-14
  Administered 2023-09-27 – 2023-10-07 (×3): 650 mg
  Filled 2023-09-22 (×3): qty 2

## 2023-09-22 MED ORDER — DROPERIDOL 2.5 MG/ML IJ SOLN: 0.6250 mg | Freq: Once | INTRAMUSCULAR | Status: DC | PRN

## 2023-09-22 MED ORDER — ACETAMINOPHEN 10 MG/ML IV SOLN: 1000.0000 mg | Freq: Once | INTRAVENOUS | Status: DC | PRN

## 2023-09-22 MED ORDER — 0.9 % SODIUM CHLORIDE (POUR BTL) OPTIME
TOPICAL | Status: DC | PRN
  Administered 2023-09-22: 1000 mL

## 2023-09-22 MED ORDER — ORAL CARE MOUTH RINSE: 15.0000 mL | Freq: Once | OROMUCOSAL | Status: AC

## 2023-09-22 MED ORDER — SUCCINYLCHOLINE CHLORIDE 200 MG/10ML IV SOSY
PREFILLED_SYRINGE | INTRAVENOUS | Status: DC | PRN
  Administered 2023-09-22: 140 mg via INTRAVENOUS

## 2023-09-22 MED ORDER — INSULIN GLARGINE-YFGN 100 UNIT/ML ~~LOC~~ SOLN
18.0000 [IU] | Freq: Two times a day (BID) | SUBCUTANEOUS | Status: DC
  Administered 2023-09-22 – 2023-09-24 (×5): 18 [IU] via SUBCUTANEOUS
  Filled 2023-09-22 (×8): qty 0.18

## 2023-09-22 MED ORDER — CHLORHEXIDINE GLUCONATE 0.12 % MT SOLN
OROMUCOSAL | Status: AC
  Administered 2023-09-22: 15 mL via OROMUCOSAL
  Filled 2023-09-22: qty 15

## 2023-09-22 MED ORDER — SUGAMMADEX SODIUM 200 MG/2ML IV SOLN
INTRAVENOUS | Status: DC | PRN
  Administered 2023-09-22: 200 mg via INTRAVENOUS

## 2023-09-22 MED ORDER — DEXAMETHASONE SODIUM PHOSPHATE 10 MG/ML IJ SOLN
INTRAMUSCULAR | Status: AC
  Filled 2023-09-22: qty 1

## 2023-09-22 MED ORDER — LACTATED RINGERS IV SOLN: INTRAVENOUS | Status: DC

## 2023-09-22 MED ORDER — ONDANSETRON HCL 4 MG PO TABS: 4.0000 mg | ORAL_TABLET | Freq: Four times a day (QID) | ORAL | Status: DC | PRN

## 2023-09-22 MED ORDER — ONDANSETRON HCL 4 MG/2ML IJ SOLN
INTRAMUSCULAR | Status: AC
  Filled 2023-09-22: qty 2

## 2023-09-22 MED ORDER — PROPOFOL 10 MG/ML IV BOLUS
INTRAVENOUS | Status: DC | PRN
  Administered 2023-09-22: 80 mg via INTRAVENOUS

## 2023-09-22 MED ORDER — FENTANYL CITRATE (PF) 250 MCG/5ML IJ SOLN
INTRAMUSCULAR | Status: AC
  Filled 2023-09-22: qty 5

## 2023-09-22 MED ORDER — CHLORHEXIDINE GLUCONATE 0.12 % MT SOLN: 15.0000 mL | Freq: Once | OROMUCOSAL | Status: AC

## 2023-09-22 MED ORDER — ROCURONIUM BROMIDE 10 MG/ML (PF) SYRINGE
PREFILLED_SYRINGE | INTRAVENOUS | Status: DC | PRN
  Administered 2023-09-22: 30 mg via INTRAVENOUS

## 2023-09-22 MED ORDER — IOHEXOL 300 MG/ML  SOLN
INTRAMUSCULAR | Status: DC | PRN
  Administered 2023-09-22: 45 mL

## 2023-09-22 MED ORDER — ONDANSETRON HCL 4 MG/2ML IJ SOLN
INTRAMUSCULAR | Status: DC | PRN
  Administered 2023-09-22: 4 mg via INTRAVENOUS

## 2023-09-22 MED ORDER — ACETAMINOPHEN 650 MG RE SUPP: 650.0000 mg | Freq: Four times a day (QID) | RECTAL | Status: DC | PRN

## 2023-09-22 MED ORDER — LIDOCAINE 2% (20 MG/ML) 5 ML SYRINGE
INTRAMUSCULAR | Status: DC | PRN
  Administered 2023-09-22: 40 mg via INTRAVENOUS

## 2023-09-22 MED ORDER — PHENYLEPHRINE 80 MCG/ML (10ML) SYRINGE FOR IV PUSH (FOR BLOOD PRESSURE SUPPORT)
PREFILLED_SYRINGE | INTRAVENOUS | Status: DC | PRN
  Administered 2023-09-22: 240 ug via INTRAVENOUS
  Administered 2023-09-22 (×3): 160 ug via INTRAVENOUS

## 2023-09-22 MED ORDER — MIDAZOLAM HCL 2 MG/2ML IJ SOLN
INTRAMUSCULAR | Status: AC
  Filled 2023-09-22: qty 2

## 2023-09-22 MED ORDER — VIVONEX RTF PO LIQD
1560.0000 mL | ORAL | Status: AC
  Administered 2023-09-22: 1560 mL
  Filled 2023-09-22: qty 1560

## 2023-09-22 MED ORDER — DEXAMETHASONE SODIUM PHOSPHATE 10 MG/ML IJ SOLN
INTRAMUSCULAR | Status: DC | PRN
  Administered 2023-09-22: 5 mg via INTRAVENOUS

## 2023-09-22 MED ORDER — FENTANYL CITRATE (PF) 100 MCG/2ML IJ SOLN: 25.0000 ug | INTRAMUSCULAR | Status: DC | PRN

## 2023-09-22 MED ORDER — ONDANSETRON HCL 4 MG/2ML IJ SOLN
4.0000 mg | Freq: Four times a day (QID) | INTRAMUSCULAR | Status: DC | PRN
  Administered 2023-10-06: 4 mg via INTRAVENOUS
  Filled 2023-09-22: qty 2

## 2023-09-22 MED ORDER — OXYCODONE HCL 5 MG PO TABS: 5.0000 mg | ORAL_TABLET | Freq: Once | ORAL | Status: DC | PRN

## 2023-09-22 MED ORDER — MIDAZOLAM HCL 2 MG/2ML IJ SOLN
INTRAMUSCULAR | Status: DC | PRN
Start: 2023-09-22 — End: 2023-09-22
  Administered 2023-09-22: 2 mg via INTRAVENOUS

## 2023-09-22 MED ORDER — OXYCODONE HCL 5 MG/5ML PO SOLN: 5.0000 mg | Freq: Once | ORAL | Status: DC | PRN

## 2023-09-22 SURGICAL SUPPLY — 21 items
BUTTON OLYMPUS DEFENDO 5 PIECE (MISCELLANEOUS) ×2 IMPLANT
CANISTER SUCT 3000ML PPV (MISCELLANEOUS) ×2 IMPLANT
CNTNR URN SCR LID CUP LEK RST (MISCELLANEOUS) ×2 IMPLANT
FORCEPS GRASP COMBO 8X230 (FORCEP) IMPLANT
GAUZE SPONGE 4X4 12PLY STRL (GAUZE/BANDAGES/DRESSINGS) ×2 IMPLANT
GLOVE BIO SURGEON STRL SZ7 (GLOVE) ×2 IMPLANT
GLOVE BIO SURGEON STRL SZ7.5 (GLOVE) ×2 IMPLANT
GOWN STRL REUS W/ TWL XL LVL3 (GOWN DISPOSABLE) ×2 IMPLANT
MARKER SKIN DUAL TIP RULER LAB (MISCELLANEOUS) ×2 IMPLANT
NS IRRIG 1000ML POUR BTL (IV SOLUTION) ×2 IMPLANT
OIL SILICONE PENTAX (PARTS (SERVICE/REPAIRS)) IMPLANT
PAD ARMBOARD POSITIONER FOAM (MISCELLANEOUS) ×4 IMPLANT
STENT WALLFLEX ESOPH 23X125MM (Stent) IMPLANT
SYR 20ML ECCENTRIC (SYRINGE) ×2 IMPLANT
TOWEL GREEN STERILE (TOWEL DISPOSABLE) ×2 IMPLANT
TOWEL GREEN STERILE FF (TOWEL DISPOSABLE) ×2 IMPLANT
TUBE CONNECTING 20X1/4 (TUBING) ×2 IMPLANT
TUBING ENDO SMARTCAP (MISCELLANEOUS) ×2 IMPLANT
UNDERPAD 30X36 HEAVY ABSORB (UNDERPADS AND DIAPERS) ×2 IMPLANT
WATER STERILE IRR 1000ML POUR (IV SOLUTION) ×2 IMPLANT
WIRE HYDRA 450CM (MISCELLANEOUS) IMPLANT

## 2023-09-22 NOTE — Transfer of Care (Signed)
 Immediate Anesthesia Transfer of Care Note  Patient: Seth Burch  Procedure(s) Performed: EGD (ESOPHAGOGASTRODUODENOSCOPY), REMOVAL OF ESOPHAGEAL STENT (Esophagus) PLACEMENT, ESOPHAGEAL STENT (Esophagus)  Patient Location: PACU  Anesthesia Type:General  Level of Consciousness: awake, drowsy, and patient cooperative  Airway & Oxygen Therapy: Patient Spontanous Breathing and Patient connected to face mask oxygen  Post-op Assessment: Report given to RN and Post -op Vital signs reviewed and stable  Post vital signs: Reviewed and stable  Last Vitals:  Vitals Value Taken Time  BP 114/70 09/22/23 1705  Temp    Pulse 95 09/22/23 1714  Resp 37 09/22/23 1714  SpO2 91 % 09/22/23 1714  Vitals shown include unfiled device data.  Last Pain:  Vitals:   09/22/23 1402  TempSrc: Oral  PainSc: 6       Patients Stated Pain Goal: 1 (09/22/23 1402)  Complications: Patient with decreased O2 saturations Patient traveled with O2 face mask  in PACU tried nasal canula.  Didn't tolerate  Face mask 100% put back on patient  Sats in low 90s with encouragement to take big breaths and cough.  Dr Cleora Daft at bedside

## 2023-09-22 NOTE — Anesthesia Preprocedure Evaluation (Addendum)
 Anesthesia Evaluation  Patient identified by MRN, date of birth, ID band Patient awake    Reviewed: Allergy & Precautions, NPO status , Patient's Chart, lab work & pertinent test results  History of Anesthesia Complications Negative for: history of anesthetic complications  Airway Mallampati: II  TM Distance: >3 FB Neck ROM: Full    Dental no notable dental hx. (+) Teeth Intact, Dental Advisory Given   Pulmonary neg pulmonary ROS   Pulmonary exam normal breath sounds clear to auscultation       Cardiovascular hypertension, Pt. on medications and Pt. on home beta blockers (-) angina (-) Past MI Normal cardiovascular exam Rhythm:Regular Rate:Normal     Neuro/Psych negative neurological ROS     GI/Hepatic ,GERD  Medicated and Controlled,,Hx of esophageal cancer, s/p esophagectomy. Now with esophageal leak   Endo/Other  diabetes, Insulin  Dependent    Renal/GU Renal InsufficiencyRenal diseaseLab Results      Component                Value               Date                      NA                       137                 09/08/2023                CL                       102                 09/08/2023                K                        3.6                 09/08/2023                CO2                      27                  09/08/2023                BUN                      23                  09/08/2023                CREATININE               1.40 (H)            09/08/2023                GFRNONAA                 55 (L)              09/08/2023                CALCIUM   7.4 (L)             09/08/2023                PHOS                     3.0                 09/04/2023                ALBUMIN                   <1.5 (L)            09/01/2023                GLUCOSE                  140 (H)             09/08/2023                Musculoskeletal  (+) Arthritis ,    Abdominal   Peds  Hematology  (+)  Blood dyscrasia, anemia     Anesthesia Other Findings   Reproductive/Obstetrics                             Anesthesia Physical Anesthesia Plan  ASA: 3  Anesthesia Plan: General   Post-op Pain Management:    Induction: Intravenous  PONV Risk Score and Plan: 2 and Treatment may vary due to age or medical condition, Ondansetron  and Dexamethasone   Airway Management Planned: Oral ETT  Additional Equipment: None  Intra-op Plan:   Post-operative Plan: Extubation in OR  Informed Consent: I have reviewed the patients History and Physical, chart, labs and discussed the procedure including the risks, benefits and alternatives for the proposed anesthesia with the patient or authorized representative who has indicated his/her understanding and acceptance.     Dental advisory given  Plan Discussed with: CRNA  Anesthesia Plan Comments:        Anesthesia Quick Evaluation

## 2023-09-22 NOTE — Op Note (Signed)
      [address]       [name] [address]             [phone]        09/22/2023  Patient:  Seth Burch Pre-Op Dx: History of Ivor Lewis esophagectomy Anastomotic leak malnutrition   Post-op Dx:  same Procedure: - Esophagogastroscopy - 125 mm covered esophageal stent placement - Esophagram with Omnipaque    Surgeon and Role:      * Mylynn Dinh, Marinell Siad, MD - Primary  Anesthesia  general EBL:  0ml Blood Administration: none Specimen:  none   Counts: correct   Indications: 68yo male with history of a late anastomotic leak following esophagectomy on 3/10.  On 4/2, he represented with an anastomotic leak which was covered with a stent.  It has been 4 weeks, and the comes today to assess healing.  Findings: Viable mucosa.  Necrotic tissue, at the anastomosis.  Esophagram reveal and ongoing leak.  After placement of the stent, there was no extravasation.    Operative Technique: After the risks, benefits and alternatives were thoroughly discussed, the patient was brought to the operative theatre.  Anesthesia was induced. The patient was prepped and draped in normal sterile fashion.  An appropriate surgical pause was performed, and pre-operative antibiotics were dosed accordingly.  The gastroscope was advanced through the oropharynx into the cervical esophagus under direct visualization.  The scope was passed into the stent.  It was grasped and removed.  The scope was advanced back into the esophagus, and necrotic tissue was noted at the anastomosis.  Esophagram did show an ongoing leak.  This area was marked on the skin with paper clips.   Next a Jag wire was passed through the gastroscope into the stomach with fluoroscopic guidance.  The esophageal stent was then placed over the wire, and positioned under fluoroscopy to cover the mucosal defect.    The patient tolerated the procedure without any immediate complications, and was transferred to the PACU in  stable condition.  Ohana Birdwell Ala Alice

## 2023-09-22 NOTE — Anesthesia Postprocedure Evaluation (Signed)
 Anesthesia Post Note  Patient: Seth Burch  Procedure(s) Performed: EGD (ESOPHAGOGASTRODUODENOSCOPY), REMOVAL OF ESOPHAGEAL STENT (Esophagus) PLACEMENT, ESOPHAGEAL STENT (Esophagus)     Patient location during evaluation: PACU Anesthesia Type: General Level of consciousness: awake and alert, patient cooperative and oriented Pain management: pain level controlled Vital Signs Assessment: post-procedure vital signs reviewed and stable Respiratory status: spontaneous breathing, nonlabored ventilation, respiratory function stable and patient connected to nasal cannula oxygen (Remains on [place] O2) Cardiovascular status: blood pressure returned to baseline and stable Postop Assessment: no apparent nausea or vomiting Anesthetic complications: no   No notable events documented.  Last Vitals:  Vitals:   09/22/23 1745 09/22/23 1752  BP: 94/65 92/64  Pulse: 94 93  Resp: (!) 29 (!) 30  Temp:  36.8 C  SpO2: 90% 91%    Last Pain:  Vitals:   09/22/23 1752  TempSrc:   PainSc: 0-No pain                 Marcelia Petersen,E. Eustace Hur

## 2023-09-22 NOTE — Anesthesia Procedure Notes (Signed)
 Procedure Name: Intubation Date/Time: 09/22/2023 4:25 PM  Performed by: Alphia Jasmine, CRNAPre-anesthesia Checklist: Patient identified, Emergency Drugs available, Suction available, Timeout performed and Patient being monitored Patient Re-evaluated:Patient Re-evaluated prior to induction Oxygen Delivery Method: Circle system utilized Preoxygenation: Pre-oxygenation with 100% oxygen Induction Type: IV induction Ventilation: Mask ventilation without difficulty Laryngoscope Size: Mac and 4 Grade View: Grade I Tube type: Oral Tube size: 7.5 mm Number of attempts: 1 Airway Equipment and Method: Stylet Placement Confirmation: ETT inserted through vocal cords under direct vision, positive ETCO2, CO2 detector and breath sounds checked- equal and bilateral Secured at: 22 cm Tube secured with: Tape Dental Injury: Teeth and Oropharynx as per pre-operative assessment

## 2023-09-22 NOTE — Care Management Important Message (Signed)
 Important Message  Patient Details  Name: Seth Burch MRN: [id number] Date of Birth: March 29, 1956   Important Message Given:  Yes - Medicare IM     Janith Melnick 09/22/2023, 12:00 PM

## 2023-09-22 NOTE — Progress Notes (Addendum)
      [address]       [name] [address]             [phone]       Subjective:  Resting in bed with his wife at the bedside.  He continues to have a cough with yellow sputum and a sense of fullness and diminished appetite. The TF is currently off for planned EGD and possible stent removal later today.   BM yesterday.   Objective: Vital signs in last 24 hours: Temp:  [97.7 F (36.5 C)-98.8 F (37.1 C)] 98.3 F (36.8 C) (05/05 0401) Pulse Rate:  [93-103] 100 (05/05 0401) Cardiac Rhythm: Normal sinus rhythm (05/04 2005) Resp:  [17-40] 17 (05/05 0401) BP: (99-128)/(64-74) 120/72 (05/05 0401) SpO2:  [85 %-100 %] 96 % (05/05 0401)  Intake/Output from previous day: 05/04 0701 - 05/05 0700 In: 300 [P.O.:300] Out: 650 [Urine:550; Chest Tube:100]  General appearance: alert, cooperative, and some distress due to persistent cough Heart: regular rate and rhythm. SR with occasional ST. Lungs: normal work of breathing, breath sounds are clear. CT drained white opaque fluid past 24 hours. Abdomen: soft, mildly tender, non-distended.  The J-tube insertion site is dressed with bulky dressings.  Extremities: extremities normal, atraumatic, no cyanosis or edema Wound: continued drainage around J Tube, right chest wound is clean and dry  Lab Results: Recent Labs    09/19/23 1524 09/20/23 0445  WBC 10.3 10.6*  HGB 7.4* 7.3*  HCT 22.9* 23.3*  PLT 388 415*   BMET:  Recent Labs    09/19/23 1524 09/20/23 0445 09/21/23 0730  NA 137 135  --   K 3.4* 3.4* 3.6  CL 104 104  --   CO2 21* 24  --   GLUCOSE 504* 365*  --   BUN 37* 39*  --   CREATININE 1.61* 1.39*  --   CALCIUM  8.2* 8.0*  --     PT/INR: No results for input(s): "LABPROT", "INR" in the last 72 hours. ABG    Component Value Date/Time   PHART 7.4 07/29/2023 0514   HCO3 25.8 08/02/2023 2006   TCO2 17 (L) 08/19/2023 2022   ACIDBASEDEF 1.0 08/02/2023 1506   O2SAT 94.5 08/02/2023 2006    CBG (last 3)  Recent Labs    09/21/23 1632 09/21/23 2116 09/22/23 0618  GLUCAP 290* 222* 158*    Assessment/Plan:  S/P Esophagectomy, complicated by Anastomotic Leak S/p Stent Placement 4/2. Plan stent removal later today.  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  Chyle leak persists, CT output has past 24 hours, leave in place Severe Protein Calorid Malnutrition- on dysphagia 1 diet, oral intake remains poor other than liquids.  Mr. Laconte is limiting oral intake due to poor appetite and sensation of swallow dysfunction. Speech pathologists evaluation noted.  TF on hold for procedure.  Cough- he states the Tussionex did not help and was replaced with Robitussin DM  5/4. DM- CBG's 220-312 yesterday.  Currently on Semeglee to 30 units, will change to 18 units BID, continue SSI.  J Tube Bilious Drainage- continue diligent wound care Deconditioning- mainly due to weakness, continue PT/OT HEME- Hct trending up yesterday, no new lab today.  Constipation- Had BM yesterday  Dispo- patient stable, for EGD todat with possible esophageal stent removal.    LOS: 3 days   Leata Providence, PA-C 09/22/2023  Agree EGD today, possible stent removal  Radek Carnero O Sheritta Deeg

## 2023-09-22 NOTE — Progress Notes (Signed)
 OT Cancellation Note  Patient Details Name: Seth Burch MRN: [id number] DOB: Nov 11, 1955   Cancelled Treatment:    Reason Eval/Treat Not Completed: Patient at procedure or test/ unavailable (Patient expected to leave unit for procedure. Will attempt again on later date) Anitra Barn, OTA Acute Rehabilitation Services  Office [phone]  Jovita Nipper 09/22/2023, 12:50 PM

## 2023-09-23 ENCOUNTER — Encounter (HOSPITAL_COMMUNITY): Payer: Self-pay | Admitting: Thoracic Surgery (Cardiothoracic Vascular Surgery)

## 2023-09-23 LAB — CBC
HCT: 22.8 % — ABNORMAL LOW (ref 39.0–52.0)
Hemoglobin: 7.1 g/dL — ABNORMAL LOW (ref 13.0–17.0)
MCH: 27.4 pg (ref 26.0–34.0)
MCHC: 31.1 g/dL (ref 30.0–36.0)
MCV: 88 fL (ref 80.0–100.0)
Platelets: 392 10*3/uL (ref 150–400)
RBC: 2.59 MIL/uL — ABNORMAL LOW (ref 4.22–5.81)
RDW: 16.6 % — ABNORMAL HIGH (ref 11.5–15.5)
WBC: 9.2 10*3/uL (ref 4.0–10.5)
nRBC: 0 % (ref 0.0–0.2)

## 2023-09-23 LAB — GLUCOSE, CAPILLARY
Glucose-Capillary: 412 mg/dL — ABNORMAL HIGH (ref 70–99)
Glucose-Capillary: 427 mg/dL — ABNORMAL HIGH (ref 70–99)
Glucose-Capillary: 405 mg/dL — ABNORMAL HIGH (ref 70–99)
Glucose-Capillary: 415 mg/dL — ABNORMAL HIGH (ref 70–99)
Glucose-Capillary: 359 mg/dL — ABNORMAL HIGH (ref 70–99)
Glucose-Capillary: 333 mg/dL — ABNORMAL HIGH (ref 70–99)

## 2023-09-23 LAB — MAGNESIUM: Magnesium: 2.1 mg/dL (ref 1.7–2.4)

## 2023-09-23 LAB — PREPARE RBC (CROSSMATCH)

## 2023-09-23 LAB — POTASSIUM: Potassium: 5 mmol/L (ref 3.5–5.1)

## 2023-09-23 LAB — PHOSPHORUS: Phosphorus: 5.7 mg/dL — ABNORMAL HIGH (ref 2.5–4.6)

## 2023-09-23 MED ORDER — VIVONEX RTF PO LIQD
1560.0000 mL | ORAL | Status: DC
  Administered 2023-09-24: 1560 mL
  Administered 2023-09-25: 1000 mL
  Filled 2023-09-23 (×4): qty 1560

## 2023-09-23 MED ORDER — VIVONEX RTF PO LIQD
1560.0000 mL | ORAL | Status: DC
  Filled 2023-09-23: qty 1560

## 2023-09-23 MED ORDER — SODIUM CHLORIDE 0.9% IV SOLUTION: Freq: Once | INTRAVENOUS | Status: AC

## 2023-09-23 MED ORDER — INSULIN ASPART 100 UNIT/ML IJ SOLN
4.0000 [IU] | INTRAMUSCULAR | Status: DC
  Administered 2023-09-23 – 2023-09-24 (×5): 4 [IU] via SUBCUTANEOUS

## 2023-09-23 NOTE — Progress Notes (Addendum)
      [address]       [place] [address]             [phone]       Subjective:  Resting in bed, said he is comfortable. Cough improved after holding the Robitussin.   EGD with esophageal stent removal yesterday showed persistent perforation, a new esophageal stent was placed.   BM yesterday.   Objective: Vital signs in last 24 hours: Temp:  [97.6 F (36.4 C)-98.2 F (36.8 C)] 97.9 F (36.6 C) (05/06 0353) Pulse Rate:  [71-103] 71 (05/06 0353) Cardiac Rhythm: Normal sinus rhythm (05/05 1900) Resp:  [12-34] 20 (05/06 0353) BP: (90-114)/(60-71) 101/62 (05/06 0353) SpO2:  [77 %-98 %] 92 % (05/06 0353) Weight:  [73.9 kg] 73.9 kg (05/05 1402)  Intake/Output from previous day: 05/05 0701 - 05/06 0700 In: 3415 [I.V.:540; NG/GT:2725] Out: 450 [Urine:400; Chest Tube:50]  General appearance: alert, cooperative, and no distress Heart: regular rate and rhythm. SR with occasional ST. Lungs: normal work of breathing, breath sounds are clear. CT drained 50ml white opaque fluid past 24 hours. Abdomen: soft, mildly tender, non-distended.  The J-tube insertion site is dressed with bulky dressings.  Extremities: extremities normal, atraumatic, no cyanosis or edema Wound: continued drainage around J Tube, right chest site is clean and dry  Lab Results: Recent Labs    09/22/23 0815 09/23/23 0500  WBC 11.2* 9.2  HGB 7.0* 7.1*  HCT 22.6* 22.8*  PLT 371 392   BMET:  Recent Labs    09/22/23 0500 09/23/23 0500  NA 138  --   K 4.0 5.0  CL 108  --   CO2 22  --   GLUCOSE 162*  --   BUN 44*  --   CREATININE 1.30*  --   CALCIUM  7.6*  --     PT/INR: No results for input(s): "LABPROT", "INR" in the last 72 hours. ABG    Component Value Date/Time   PHART 7.4 07/29/2023 0514   HCO3 25.8 08/02/2023 2006   TCO2 17 (L) 08/19/2023 2022   ACIDBASEDEF 1.0 08/02/2023 1506   O2SAT 94.5 08/02/2023 2006   CBG (last 3)  Recent Labs    09/22/23 2108  09/23/23 0632 09/23/23 0634  GLUCAP 173* 412* 427*    Assessment/Plan:  S/P Esophagectomy, complicated by Anastomotic Leak S/p Stent Placement 4/2. EGD 5/5 with stent removal and inspection of esophageal lumen revealed persistent perforation. New esoph. stent placed.  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  Chyle leak persists, CT output has 50ml past 24 hours, leave CT in place Severe Protein Calorie Malnutrition- Allowing clear liquids,  oral intake remains poor.  TF resumed, will gradually increase to goal of 71ml/hr when glucose control is reasonable. Cough- was worse with taking Robitussin and he said has improved a lot since the Robitussin was stopped. DM- CBG's 400's this morning with TF infusing at only 35ml/hr over night.  Semeglee at 18 units BID, continue SSI. Will ask RD to evaluate TF formula.  J Tube Bilious Drainage- continue wound care Deconditioning-  continue PT/OT HEME- Hct stable in low 20's. Monitoring.  Constipation- Last BM 5/4, says he is not uncomfortable.  Dispo- patient stable, continuing supportive care.     LOS: 4 days   [name], PA-C 09/23/2023   Will transfuse 1 unit Continue tube feeds. Will plan for bronchoscopy later this week.  Lilja Soland Ala Alice

## 2023-09-23 NOTE — Progress Notes (Signed)
 Nutrition Follow-up  DOCUMENTATION CODES:   Severe malnutrition in context of chronic illness  INTERVENTION:  TF  via J-tube: Vivonex at 65ml/hr (1560ml per day) 60ml ProSource TF20 BID   Providing 1720kcal (78% minimum estimated needs), 118g protein (103% of minimum estimated needs) and per day, 18g fat   Recommend advancing TF back to goal as tolerated, Goal TF regimen- Vivonex at 70ml/hr (1680 ml per day) ProSource TF20 60ml BID   TF at goal provides- 1840 kcal (84% estimated needs), 124g protein (108% estimated needs), 1424 ml free water  daily, and 19g fat    Recommend pursuing TPN r/t multiple issues impacting patient's ability to meet his needs with enteral nutrition If patient is amenable recommend transitioning to [employer] 1.4 to better meet patients needs Monitor magnesium , potassium, and phosphorus BID for at least 3 days, MD to replete as needed, as pt is at risk for refeeding syndrome  Continue 100 mg thiamine  x 7 days  Continue MVI with minerals daily  Monitor bowels, re-initiate nutrisource fiber BID to provide 3g soluble fiber per serving to aid in bulking of stool, if indicated Recommend updated weight    NUTRITION DIAGNOSIS:  Severe Malnutrition related to chronic illness (esophageal cancer s/p chemo, radiation and surgery) as evidenced by severe muscle depletion, severe fat depletion, percent weight loss. - new dx established w/ NFPE complete  GOAL:  Patient will meet greater than or equal to 90% of their needs - not progressing  MONITOR:  Diet advancement, TF tolerance, Weight trends, Labs, PO intake  REASON FOR ASSESSMENT:  Consult Enteral/tube feeding initiation and management  ASSESSMENT:   68 y.o Male with multiple readmissions due to complications after esophagectomy and J tube placement on 3/10 including, anastomosis leaks, dysphagia, chylothorax, FTT. Now presents with persistent coughing episodes, SOB, J tube drainage, esophageal  anastomotic leak. PMH of DM, HTN, dyslipedemia, CKD, esophageal cancer, IgG kappa MGUS.   Previous Admission 07/28/2023-08/19/2023 3/10 EGD, XI Robotic Assisted Ivor Lewis Esophagectomy, J-tube placement 3/11 Trickle TF initiated 3/12 Surgery increased TF to 40 ml/hr 3/13 Surgery increased TF to 55 ml/hr, transferred to ICU  3/14 Surgery increased TF to 65 ml/hr (goal), bowel regimen added and +BM 3/15 Intubated, Bronch (Thick mucus in trachea suctioned, partially occluding L mainstem, ?aspiration, recurrent false vocal cord irritation with swelling leading to stridor and resp failure) 3/17 Extubated 3/18 Esophogram:  Brisk tracheal aspiration of ingested contrast, into the LEFT mainstem bronchus. SLP eval recommended. Stagnant contrast at diaphragmatic hiatus. No anastomotic leak.  3/19 Diet advanced to Dysphagia 2/Thins, later downgraded to Nectar Thick Liquids 3/20 Transition to Nocturnal TF, family to bring protein shake in from home 3/21 Significant diarrhea overnight, (9 stools in 10 hours), significant J-tube drainage, change to Vivonex with day time feedings 3/24 Diet changed to Dysphagia 2, Thins per SLP 3/26 NPO due to concern for possible esophageal leak 3/27 advanced to Dysphagia 3, thin liquids  3/31 modify TF regimen for d/c 4/1 discharged home Previous Admission 08/19/2023-09/15/2023 4/2 admitted; OR: R VATS, EGD, and esophageal stent placement 4/3 trial Johny Nap TF formula during the day (16 hours), transfusion 4/4 advanced to goal rate; leaking J-tube, TF held  4/5 txr to cardiovascular ICU 4/7 TF re-initiated, transfused  4/9 J-tube replaced by IR 4/10 significant leaking from J-tube; TFs on hold  4/11 PICC placed, TPN initiated, J-tube replaced again, WOUND VAC 4/12 re-initiate trickles s/p second J-tube replacement 4/13 PICC line replaced 4/14 slowly advance TF goal rate 4/16 TF to  goal rate; TPN discontinued 4/17 chylothorax: transition to Vivonex and very low fat  diet 4/19 TF reduced due to feelings of fullness/diarrhea 4/21 TF rate increased to 31ml/hr 4/24 embolization of chyle leak 4/25 TF increased to 20ml/hr 4/28 discharged home Current Admission 5/2 Admitted Viovonex started @ 65 ml/hr, J tube drainage and chylothorax persists 5/5 EGD w/ esophageal stent replacement 5/6 transfused  Pt home for three days and re-admitted for number of concerns. EGD yesterday with replacement of esophageal stent. Tube feeds held. Chylothorax persists. Vivonex re-initiated over the weekend. Blood sugars noted to be significantly more elevated than previous admission, at which time, patient was on the same tube feed regimen. Leaking around J-tube.  He reports that the first two days at home were uneventful. Could not clearly recall what specifically brought him back to the hospital, but voices frustration around no knowing how to proceed.   Discussed path forward with surgery PA-C, Parris Bolognese, as a number of priorities are present. At this time, TPN likely the best choice as this would provide adequate calories/protein, while reducing J-tube leakage allowing skin to heal, and contribute little to no fat content in and effort to reduce chest tube output.   If blood sugar management remains priority, [employer] formula will provide less sugar, but more fat and runs the risk of intolerance. It does meet patient estimated calorie and protein needs better than Vivonex, however. Will await surgery preference before proceeding.  Patient and family unwilling to change to [facility] approaching discharge during last admission. Surgery did discuss with patient and wife at bedside today, who are in agreement, should formula need to be modified. Patient continues to endorse feelings of pressure in lower abdomen where tube feed is infusing. Bowels stable, however.   Admit/Current Weight:  73.9kg   No significant edema on exam. Has shown 7.9% weight loss in three months,  which is considered clinically significant for the time frame reviewed. Has shown 12.5% weight loss in last six months, which is also considered clinically significant for the time frame reviewed.   Intake/Output Summary (Last 24 hours) at 09/23/2023 1511 Last data filed at 09/23/2023 1450 Gross per 24 hour  Intake 3864.34 ml  Output 450 ml  Net 3414.34 ml    Net IO Since Admission: 1,008.25 mL [09/23/23 1511]    Drains/Lines: LUQ: Jejunostomy (20 Fr) R Lateral Pleural (19Fr): 50ml x24 hours UOP: x24 hours   Blood sugars significantly elevated this admission. This is not baseline during previous admission on the same TF regimen. Consider underlying infectious etiology.   Meds: famotidine , folic acid , SSI 0-9 TID, SSI 4 q4 hours, Semglee  18 BID, MCT oil, MVI, KCl, thiamine   Of note, insulin  regimen during last admission: SSI 0-20 QID, SSI 3 q4 hours, Semglee  14 daily.   Labs:  Na+ 138 (wdl) K+ 3.4>3.6>4.0>5.0 (wdl) PHOS 3.5>5.7 (H) Mg 2.1 (wdl) Hgb 7.1 (L) CBGs 162-365 x48 hours A1c 7.1 (07/2023)  NUTRITION - FOCUSED PHYSICAL EXAM:  Flowsheet Row Most Recent Value  Orbital Region Severe depletion  Upper Arm Region Severe depletion  Thoracic and Lumbar Region Moderate depletion  Buccal Region Severe depletion  Temple Region Moderate depletion  Clavicle Bone Region Severe depletion  Clavicle and Acromion Bone Region Severe depletion  Scapular Bone Region Severe depletion  Dorsal Hand Moderate depletion  Patellar Region Severe depletion  Anterior Thigh Region Severe depletion  Posterior Calf Region Severe depletion  Edema (RD Assessment) None  Hair Reviewed  Eyes Reviewed  Mouth  Reviewed  Skin Reviewed  Nails Reviewed    Diet Order:   Diet Order             Diet NPO time specified  Diet effective midnight            EDUCATION NEEDS:  Education needs have been addressed  Skin:  Skin Assessment: Skin Integrity Issues: Incisions: R chest tube Other: MASD  around J-tube  Last BM:  5/5 - type 7 x1  Height:  Ht Readings from Last 1 Encounters:  09/22/23 5\' 10"  (1.778 m)   Weight:  Wt Readings from Last 1 Encounters:  09/22/23 73.9 kg   Ideal Body Weight:  75.5 kg  BMI:  Body mass index is 23.39 kg/m.  Estimated Nutritional Needs:   Kcal:  2200-2400 kcal  Protein:  115-130 gm  Fluid:  >2L/day  Con Decant MS, RD, LDN Registered Dietitian Clinical Nutrition RD Inpatient Contact Info in Amion

## 2023-09-23 NOTE — Progress Notes (Signed)
 J-tube dressing changed per MD order without difficulty.  Large amount of drainage soaked through dressings and bed pad. Skin around tube very red and tender.  Chest tube dressing changed without difficulty.  Small amout of drainage on dressing.

## 2023-09-23 NOTE — Progress Notes (Addendum)
 J-tube dressing changed performed per MD order without difficulty.  Significantly less drainage since am.  Surrounding skin red and macerated.

## 2023-09-23 NOTE — Progress Notes (Signed)
 Physical Therapy Treatment Patient Details Name: Seth Burch MRN: [id number] DOB: 1956/04/21 Today's Date: 09/23/2023   History of Present Illness 69 y.o. male presents to [facility] 09/19/23 after follow-up with Dr.Lightfoot with failure to thrive, low O2 sats/SOB and milky-output of chest tube. Pt with chylothorax, esophageal anastomotic leak, and J tube bilious drainage. Plan for EGD w/ possible stent removal Monday. Extensive prior admits 3/10-4/1 and 4/1-4/28. 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 Pt awake and alert throughout session and demonstrating progress towards acute goals, however pt continues to be limited by fatigue and weakness. Pt able to come to standing at EOB x3 during session with RW support. Pt with c/o lightheadedness in standing with gait deferred and time in standing limited due to fatigue with pt needing to sit after ~20 seconds. BP stable with all positional changes. Educated and discussed importance of time up OOB and frequent mobilization to maximize functional gains with pt verbalizing understanding. Patient will benefit from intensive inpatient follow-up therapy, >3 hours/day to work towards independence with mobility and reduce hospital readmission rate, will continue to follow acutely.     If plan is discharge home, recommend the following: Assistance with cooking/housework;Assist for transportation;Help with stairs or ramp for entrance;A little help with walking and/or transfers;A little help with bathing/dressing/bathroom   Can travel by private vehicle        Equipment Recommendations  None recommended by PT    Recommendations for Other Services       Precautions / Restrictions Precautions Precautions: Fall Recall of Precautions/Restrictions: Intact Precaution/Restrictions  Comments: L UQ jejunostomy, R chest tube Restrictions Weight Bearing Restrictions Per Provider Order: No     Mobility  Bed Mobility Overal bed mobility: Needs Assistance Bed Mobility: Supine to Sit, Sit to Supine     Supine to sit: Min assist, Used rails, HOB elevated Sit to supine: Contact guard assist   General bed mobility comments: minA to lift trunk to EOB, able to return to supine without assist    Transfers Overall transfer level: Needs assistance Equipment used: Rolling walker (2 wheels) Transfers: Sit to/from Stand Sit to Stand: Min assist           General transfer comment: minA to boost to stand and steady on rise. BP 99/64 in supine, 106/72 during standing with c/o of dizzy/lightheadedness, and 113/69 at end of session in supine, able to take steps along EOB to [facility]    Ambulation/Gait               General Gait Details: deferred due to fatigue,  lightheadedness and increased RR   Stairs             Wheelchair Mobility     Tilt Bed    Modified Rankin (Stroke Patients Only)       Balance Overall balance assessment: Needs assistance, Mild deficits observed, not formally tested Sitting-balance support: Feet supported, No upper extremity supported Sitting balance-Leahy Scale: Good     Standing balance support: Reliant on assistive device for balance Standing balance-Leahy Scale: Poor Standing balance comment: reliant on UE support in standing                            Communication Communication Communication: No apparent difficulties  Cognition Arousal: Alert Behavior During Therapy:  Anxious, Flat affect   PT - Cognitive impairments: No apparent impairments                         Following commands: Intact      Cueing Cueing Techniques: Verbal cues, Gestural cues, Tactile cues, Visual cues  Exercises General Exercises - Lower Extremity Hip Flexion/Marching: AROM, Both, Seated, Other reps (comment) (x1  for 30sec) Other Exercises Other Exercises: x3 serial sit<>stand with up to minA to boost to stand and steady on rise Other Exercises: standing marching x20 secs, x2    General Comments General comments (skin integrity, edema, etc.): RR up to 44 with cues for PLB, SpO2 94% on 2L in supine, as low as 88% on RA with transfer to standing, 93% on 2L at end of session in supine      Pertinent Vitals/Pain Pain Assessment Pain Assessment: No/denies pain Pain Intervention(s): Monitored during session    Home Living                          Prior Function            PT Goals (current goals can now be found in the care plan section) Acute Rehab PT Goals PT Goal Formulation: With patient/family Time For Goal Achievement: 10/04/23 Progress towards PT goals: Progressing toward goals    Frequency    Min 3X/week      PT Plan      Co-evaluation              AM-PAC PT "6 Clicks" Mobility   Outcome Measure  Help needed turning from your back to your side while in a flat bed without using bedrails?: A Little Help needed moving from lying on your back to sitting on the side of a flat bed without using bedrails?: A Little Help needed moving to and from a bed to a chair (including a wheelchair)?: A Little Help needed standing up from a chair using your arms (e.g., wheelchair or bedside chair)?: A Little Help needed to walk in hospital room?: A Lot Help needed climbing 3-5 steps with a railing? : A Lot 6 Click Score: 16    End of Session Equipment Utilized During Treatment: Oxygen Activity Tolerance: Patient limited by fatigue Patient left: in bed;with call bell/phone within reach Nurse Communication: Mobility status PT Visit Diagnosis: Other abnormalities of gait and mobility (R26.89);Unsteadiness on feet (R26.81);Muscle weakness (generalized) (M62.81)     Time: 8295-6213 PT Time Calculation (min) (ACUTE ONLY): 24 min  Charges:    $Therapeutic Activity: 23-37  mins PT General Charges $$ ACUTE PT VISIT: 1 Visit                     Seth Steve R. PTA Acute Rehabilitation Services Office: [phone]   [name] 09/23/2023, 3:59 PM

## 2023-09-23 NOTE — Progress Notes (Signed)
 Blood glucose levels were 412 and 427 this AM. Paged the PA but have gotten no response back. Given Pt 9 Units of Novolog  Insulin , which is the highest amount available on the sliding scale.  Will inform day shift nurse.    Hilton Lucky, RN

## 2023-09-23 NOTE — Progress Notes (Signed)
 Inpatient Rehab Admissions Coordinator Note:   Per therapy recommendations patient was screened for CIR candidacy by Mickey Alar, PT. At this time, pt appears to be a potential candidate for CIR. I will place an order for rehab consult for full assessment, per our protocol.  Please contact me any with questions.Loye Rumble, PT, DPT [phone] 09/23/23 4:42 PM

## 2023-09-23 NOTE — Inpatient Diabetes Management (Signed)
 Inpatient Diabetes Program Recommendations  AACE/ADA: New Consensus Statement on Inpatient Glycemic Control (2015)  Target Ranges:  Prepandial:   less than 140 mg/dL      Peak postprandial:   less than 180 mg/dL (1-2 hours)      Critically ill patients:  140 - 180 mg/dL   Lab Results  Component Value Date   GLUCAP 405 (H) 09/23/2023   HGBA1C 7.1 (H) 07/24/2023    Review of Glycemic Control  Latest Reference Range & Units 09/22/23 15:27 09/22/23 17:11 09/22/23 18:27 09/22/23 21:08 09/23/23 06:32 09/23/23 06:34 09/23/23 09:44  Glucose-Capillary 70 - 99 mg/dL 161 (H) 096 (H) 045 (H) 173 (H) 412 (H) 427 (H) 405 (H)  (H): Data is abnormally high  Diabetes history: DM2 Outpatient Diabetes medications: Lantus  14 units every day (not taking), Jardiance 10 mg every day (not taking) Current orders for Inpatient glycemic control: Semglee  18 units BID, Novolog  0-9 units TID and 0-5 units at bedtime, Vivonex Continuous with a goal of 65 ml/hr  Inpatient Diabetes Program Recommendations:    Glucose trends elevated with tube feeds.  Might consider:  Novolog  4 units Q4H tube feed coverage (hold if feeds are held or discontinued).   Will continue to follow while inpatient.  Thank you, Hays Lipschutz, MSN, CDCES Diabetes Coordinator Inpatient Diabetes Program [phone] (team pager from 8a-5p)

## 2023-09-24 LAB — TYPE AND SCREEN
ABO/RH(D): B NEG
Antibody Screen: NEGATIVE
Unit division: 0

## 2023-09-24 LAB — BPAM RBC
Blood Product Expiration Date: 202505292359
ISSUE DATE / TIME: 202505061218
Unit Type and Rh: 1700

## 2023-09-24 LAB — BASIC METABOLIC PANEL WITH GFR
Anion gap: 9 (ref 5–15)
BUN: 60 mg/dL — ABNORMAL HIGH (ref 8–23)
CO2: 23 mmol/L (ref 22–32)
Calcium: 8.2 mg/dL — ABNORMAL LOW (ref 8.9–10.3)
Chloride: 105 mmol/L (ref 98–111)
Creatinine, Ser: 1.42 mg/dL — ABNORMAL HIGH (ref 0.61–1.24)
GFR, Estimated: 54 mL/min — ABNORMAL LOW (ref >60)
Glucose, Bld: 365 mg/dL — ABNORMAL HIGH (ref 70–99)
Potassium: 4.6 mmol/L (ref 3.5–5.1)
Sodium: 137 mmol/L (ref 135–145)

## 2023-09-24 LAB — GLUCOSE, CAPILLARY
Glucose-Capillary: 347 mg/dL — ABNORMAL HIGH (ref 70–99)
Glucose-Capillary: 352 mg/dL — ABNORMAL HIGH (ref 70–99)
Glucose-Capillary: 280 mg/dL — ABNORMAL HIGH (ref 70–99)
Glucose-Capillary: 279 mg/dL — ABNORMAL HIGH (ref 70–99)
Glucose-Capillary: 223 mg/dL — ABNORMAL HIGH (ref 70–99)

## 2023-09-24 LAB — CBC
HCT: 29 % — ABNORMAL LOW (ref 39.0–52.0)
Hemoglobin: 9.3 g/dL — ABNORMAL LOW (ref 13.0–17.0)
MCH: 27.8 pg (ref 26.0–34.0)
MCHC: 32.1 g/dL (ref 30.0–36.0)
MCV: 86.8 fL (ref 80.0–100.0)
Platelets: 422 10*3/uL — ABNORMAL HIGH (ref 150–400)
RBC: 3.34 MIL/uL — ABNORMAL LOW (ref 4.22–5.81)
RDW: 15.9 % — ABNORMAL HIGH (ref 11.5–15.5)
WBC: 11.9 10*3/uL — ABNORMAL HIGH (ref 4.0–10.5)
nRBC: 0 % (ref 0.0–0.2)

## 2023-09-24 MED ORDER — INSULIN ASPART 100 UNIT/ML IJ SOLN
0.0000 [IU] | INTRAMUSCULAR | Status: DC
  Administered 2023-09-24: 5 [IU] via SUBCUTANEOUS
  Administered 2023-09-25: 3 [IU] via SUBCUTANEOUS
  Administered 2023-09-25: 7 [IU] via SUBCUTANEOUS
  Administered 2023-09-25: 3 [IU] via SUBCUTANEOUS
  Administered 2023-09-26: 9 [IU] via SUBCUTANEOUS
  Administered 2023-09-26: 3 [IU] via SUBCUTANEOUS
  Administered 2023-09-26 (×3): 2 [IU] via SUBCUTANEOUS
  Administered 2023-09-26: 5 [IU] via SUBCUTANEOUS
  Administered 2023-09-27: 2 [IU] via SUBCUTANEOUS
  Administered 2023-09-27 – 2023-09-29 (×4): 1 [IU] via SUBCUTANEOUS
  Administered 2023-09-30 (×2): 2 [IU] via SUBCUTANEOUS
  Administered 2023-10-01 – 2023-10-03 (×4): 1 [IU] via SUBCUTANEOUS
  Administered 2023-10-03: 2 [IU] via SUBCUTANEOUS
  Administered 2023-10-04 – 2023-10-05 (×3): 1 [IU] via SUBCUTANEOUS
  Administered 2023-10-05 (×2): 2 [IU] via SUBCUTANEOUS
  Administered 2023-10-05 – 2023-10-06 (×3): 1 [IU] via SUBCUTANEOUS
  Administered 2023-10-06: 2 [IU] via SUBCUTANEOUS
  Administered 2023-10-06: 1 [IU] via SUBCUTANEOUS
  Administered 2023-10-06: 2 [IU] via SUBCUTANEOUS
  Administered 2023-10-07: 1 [IU] via SUBCUTANEOUS
  Administered 2023-10-07: 3 [IU] via SUBCUTANEOUS
  Administered 2023-10-07 (×2): 2 [IU] via SUBCUTANEOUS
  Administered 2023-10-07 (×2): 1 [IU] via SUBCUTANEOUS
  Administered 2023-10-08 (×4): 3 [IU] via SUBCUTANEOUS
  Administered 2023-10-08: 7 [IU] via SUBCUTANEOUS
  Administered 2023-10-08: 3 [IU] via SUBCUTANEOUS
  Administered 2023-10-08 – 2023-10-09 (×2): 7 [IU] via SUBCUTANEOUS
  Administered 2023-10-09: 5 [IU] via SUBCUTANEOUS

## 2023-09-24 MED ORDER — ESCITALOPRAM OXALATE 10 MG PO TABS
5.0000 mg | ORAL_TABLET | Freq: Every day | ORAL | Status: DC
  Administered 2023-09-24 – 2023-10-14 (×21): 5 mg
  Filled 2023-09-24 (×21): qty 1

## 2023-09-24 MED ORDER — INSULIN GLARGINE-YFGN 100 UNIT/ML ~~LOC~~ SOLN
24.0000 [IU] | Freq: Two times a day (BID) | SUBCUTANEOUS | Status: DC
  Administered 2023-09-24 – 2023-09-28 (×8): 24 [IU] via SUBCUTANEOUS
  Filled 2023-09-24 (×11): qty 0.24

## 2023-09-24 MED ORDER — INSULIN ASPART 100 UNIT/ML IJ SOLN
7.0000 [IU] | INTRAMUSCULAR | Status: DC
  Administered 2023-09-24 – 2023-09-29 (×20): 7 [IU] via SUBCUTANEOUS

## 2023-09-24 NOTE — Progress Notes (Signed)
 Dressing change done at J-tube site per MD order without difficulty.  Less drainage noted than previous morning. Surrounding skin more red and beginning to break down.  WOC consulted.

## 2023-09-24 NOTE — Progress Notes (Signed)
 J-tube dressing changed per MD/WOC order without difficulty.

## 2023-09-24 NOTE — Inpatient Diabetes Management (Signed)
 Inpatient Diabetes Program Recommendations  AACE/ADA: New Consensus Statement on Inpatient Glycemic Control   Target Ranges:  Prepandial:   less than 140 mg/dL      Peak postprandial:   less than 180 mg/dL (1-2 hours)      Critically ill patients:  140 - 180 mg/dL     Latest Reference Range & Units 09/23/23 06:34 09/23/23 09:44 09/23/23 11:45 09/23/23 16:51 09/23/23 21:15 09/24/23 04:43 09/24/23 05:58  Glucose-Capillary 70 - 99 mg/dL 409 (H) 811 (H) 914 (H) 359 (H) 333 (H) 347 (H) 352 (H)   Review of Glycemic Control  Diabetes history: DM2 Outpatient Diabetes medications: Lantus  14 units daily, Jardiance 10 mg daily Current orders for Inpatient glycemic control: Semglee  18 units BID, Novolog  0-9 units TID with meals, Novolog  0-5 units at bedtime, Novolog  4 units Q4H; Vivonex @ 65 ml/hr  Inpatient Diabetes Program Recommendations:    Insulin : Please consider increasing Semglee  to 24 units BID, change CBGs to Q4H, Novolog  correction to 0-9 units Q4H, and increase tube feeding coverage to Novolog  7 units Q4H.  Thanks, Beacher Limerick, RN, MSN, CDCES Diabetes Coordinator Inpatient Diabetes Program [phone] (Team Pager from 8am to 5pm)

## 2023-09-24 NOTE — Progress Notes (Signed)
   Inpatient Rehabilitation Admissions Coordinator   Met with patient and wife at bedside for rehab assessment. We discussed goals and expectations of a possible CIR admit. They state they are waiting to see if he will be transferred to [facility] for reconstructive surgery. I will follow his progress from a distance to assist in planning dispo when appropriate.  Please call me with any questions.   Jeannetta Millman, RN, MSN Rehab Admissions Coordinator [phone]

## 2023-09-24 NOTE — Significant Event (Signed)
 Rapid Response Event Note   Reason for Call :  Increased oxygen needs SpO2 83% on [facility]  Initial Focused Assessment:  Pt lying in bed, AO. Tachypneic, breathing unlabored. Clear breath sounds. Thick sputum, yellow/tan, which he is able to cough up and clear. He states he is having more coughing spells and increase in sputum today. Skin is warm, dry. Heart rate is regular.   VS: T 97.65F, BP 121/75, HR 102, RR 45, SpO2 89% on 10L HFNC  Interventions:  -HFNC  Plan of Care:  -Oral care -Humidified oxygen -Wean oxygen to achieve oxygen saturation goal  Event Summary:  MD NotifiedMena Stai, PA Call Time: 1312 Arrival Time: 1315 End Time: 1410  Washington Hacker, RN

## 2023-09-24 NOTE — Consult Note (Signed)
 WOC Nurse Consult Note: Reason for Consult: leaking J tube  Wound type: irritant, contact dermatitis to peristomal skin Pressure Injury POA: N/A Measurement: extends 1.5 cm circumferentially  Wound bed: red and moist, tender to touch Drainage moderate weeping to irritated skin, moderate tan effluent from stoma  Periwound: red and irritated Dressing procedure/placement/frequency: Stop gerhardt's, use only Drawtex drain sponge to absorb exudate and promote healing.  Change each shift and PRN soilage.   Gillermo Lack, RN, MSN, [facility] WOC Team

## 2023-09-24 NOTE — Progress Notes (Signed)
 PT Cancellation Note  Patient Details Name: VESTON BENI MRN: [id number] DOB: Jun 11, 1955   Cancelled Treatment:    Reason Eval/Treat Not Completed: (P) Medical issues which prohibited therapy, per RN pt with increased supplemental O2 requirements and asking therapies to hold this date. Will check back post procedure next date to continue with PT POC.   Beverly Buckler. PTA Acute Rehabilitation Services Office: [phone]     [name] 09/24/2023, 2:10 PM

## 2023-09-24 NOTE — Progress Notes (Signed)
 Occupational Therapy Treatment Patient Details Name: Seth Burch MRN: [id number] DOB: 09-23-1955 Today's Date: 09/24/2023   History of present illness 68 y.o. male presents to [facility] 09/19/23 after follow-up with Dr.Lightfoot with failure to thrive, low O2 sats/SOB and milky-output of chest tube. Pt with chylothorax, esophageal anastomotic leak, and J tube bilious drainage. EGD on 5/5 with stent removal and inspection of esophageal lumen revealed persistent perforation. New esoph. stent placed. [name] Extensive prior admits 3/10-4/1 and 4/1-4/28. 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 OT spent 8 min with pt coordinating pain meds with RN and educating pt on non-pharmacological pain management techniques including bracing and position changes. Pt with family member present and pt demonstrated understanding. Pt was unsure of OT returning today, and OT will try and circle back as able to offer services. Pt anticipates a procedure tomorrow.       If plan is discharge home, recommend the following:  A little help with walking and/or transfers;A lot of help with bathing/dressing/bathroom;Assistance with cooking/housework;Direct supervision/assist for medications management;Direct supervision/assist for financial management;Assist for transportation;Help with stairs or ramp for entrance;Supervision due to cognitive status   Equipment Recommendations   (defer)    Recommendations for Other Services Rehab consult    Precautions / Restrictions Precautions Precautions: Fall Recall of Precautions/Restrictions: Intact Precaution/Restrictions Comments: L UQ jejunostomy, R chest tube Restrictions Weight Bearing Restrictions Per Provider Order: No             Communication  WFL   Cognition Arousal: Alert Behavior During Therapy: Anxious,  Flat affect                                             General Comments      Pertinent Vitals/ Pain       Pain Assessment Pain Assessment: 0-10 Pain Score: 7  Pain Location: abdomen Pain Intervention(s): Other (comment), Patient requesting pain meds-RN notified (Pt taught how to use gentle bracing technique to manage increased abdominal pain with frequent cough.  Also discussed position as occassionally changes position tends to dampen pt's cough.)  Home Living                                          Prior Functioning/Environment              Frequency  Min 2X/week        Progress Toward Goals  OT Goals(current goals can now be found in the care plan section)  Progress towards OT goals: Not progressing toward goals - comment  Acute Rehab OT Goals Patient Stated Goal: Limited progress with visit focusing on pain-managmenet strategies.  Will return for EOB/OOB today if able and if pt can tolerate. OT Goal Formulation: Patient unable to participate in goal setting Time For Goal Achievement: 10/04/23 Potential to Achieve Goals: Good  Plan      Co-evaluation                 AM-PAC OT "6 Clicks" Daily Activity     Outcome Measure   Help from another person eating meals?: A Little Help from another person taking care of personal  grooming?: A Little Help from another person toileting, which includes using toliet, bedpan, or urinal?: A Lot Help from another person bathing (including washing, rinsing, drying)?: A Lot Help from another person to put on and taking off regular upper body clothing?: A Little Help from another person to put on and taking off regular lower body clothing?: A Lot 6 Click Score: 15    End of Session Equipment Utilized During Treatment: Oxygen  OT Visit Diagnosis: Unsteadiness on feet (R26.81);Muscle weakness (generalized) (M62.81);Other symptoms and signs involving cognitive function;Pain Pain -  part of body:  (ABD)   Activity Tolerance     Patient Left in bed;with call bell/phone within reach;with bed alarm set;with family/visitor present   Nurse Communication Patient requests pain meds        Time: 4403-4742 OT Time Calculation (min): 8 min  Charges: OT General Charges $OT Visit: 1 Visit OT Treatments $Self Care/Home Management :  (Not billed. Pain management education only.)  [facility], [place] Acute Rehab Services Office: [phone] 09/24/2023   Asher Blade 09/24/2023, 11:05 AM

## 2023-09-24 NOTE — Progress Notes (Signed)
     [address]       Seth Burch [id number]             [phone]       Pt seen in clinic, and does not look well Will admit due to failure to thrive at home  [facility]

## 2023-09-24 NOTE — Progress Notes (Addendum)
      [address]       Seth Burch [address]             [phone]       Subjective:  Just returned to bed after being in the chair for a while this morning. Had a prolonged episode of productive cough while in the chair thah has calmed down after returning to bed. Sputum is similar to the TF that is infusing via the J-tube.    Objective: Vital signs in last 24 hours: Temp:  [97.3 F (36.3 C)-97.8 F (36.6 C)] 97.5 F (36.4 C) (05/07 0711) Pulse Rate:  [73-92] 88 (05/07 0711) Cardiac Rhythm: Normal sinus rhythm (05/06 1900) Resp:  [18-34] 20 (05/07 0711) BP: (101-130)/(65-79) 130/79 (05/07 0711) SpO2:  [90 %-96 %] 93 % (05/07 0711)  Intake/Output from previous day: 05/06 0701 - 05/07 0700 In: 2026.8 [P.O.:240; I.V.:10; Blood:449.3; NG/GT:1117.5] Out: 1360 [Urine:1250; Chest Tube:110]  General appearance: alert, cooperative, and moderate distress related to the coughing Heart: regular rate and rhythm. SR with occasional ST. Lungs: normal work of breathing, breath sounds are clear. CT drained white opaque fluid past 24 hours. Abdomen: soft, mildly tender, non-distended.  The J-tube insertion site is dressed with bulky dressings that are currently dry.  Extremities: extremities normal, atraumatic, no cyanosis or edema Wound: right chest site is clean and dry  Lab Results: Recent Labs    09/23/23 0500 09/24/23 0440  WBC 9.2 11.9*  HGB 7.1* 9.3*  HCT 22.8* 29.0*  PLT 392 422*   BMET:  Recent Labs    09/22/23 0500 09/23/23 0500 09/24/23 0440  NA 138  --  137  K 4.0 5.0 4.6  CL 108  --  105  CO2 22  --  23  GLUCOSE 162*  --  365*  BUN 44*  --  60*  CREATININE 1.30*  --  1.42*  CALCIUM  7.6*  --  8.2*    PT/INR: No results for input(s): "LABPROT", "INR" in the last 72 hours. ABG    Component Value Date/Time   PHART 7.4 07/29/2023 0514   HCO3 25.8 08/02/2023 2006   TCO2 17 (L) 08/19/2023 2022   ACIDBASEDEF 1.0 08/02/2023 1506    O2SAT 94.5 08/02/2023 2006   CBG (last 3)  Recent Labs    09/23/23 2115 09/24/23 0443 09/24/23 0558  GLUCAP 333* 347* 352*    Assessment/Plan:  S/P Esophagectomy, complicated by Anastomotic Leak S/p Stent Placement 4/2. EGD 5/5 with stent removal and inspection of esophageal lumen revealed persistent perforation. New esoph. stent placed.  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  Chyle leak persists, CT output has past 24 hours, leave CT in place Severe Protein Calorie Malnutrition- Allowing clear liquids,  oral intake remains poor.  TF resumed, now at goal of 39ml/hr  Cough- seems positional and provoked by movement. Dr. Deloise Ferries planning to evaluate with bronchoscopy later this week. DM- CBG's better with adjustment in insulin  coverage yesterday but still hyperglycemic.     J Tube site inflamed, drainage persists, continue wound care Deconditioning-  continue PT/OT HEME- transfused with 1 unit PRBC's yesterday, Hgb 9.3 Constipation- Last BM 5/4, says he is not uncomfortable.  Dispo- patient stable, continuing supportive care.     LOS: 5 days   Seth G. Roddenberry, PA-C 09/24/2023     Agree Bronchoscopy tomorrow  Hilarie Lovely

## 2023-09-24 NOTE — Progress Notes (Signed)
 OT Cancellation Note  Patient Details Name: Seth Burch MRN: [id number] DOB: 1956-03-03   Cancelled Treatment:    Reason Eval/Treat Not Completed: Pain limiting ability to participate. Pt reports he did receive pain meds and pain down from 7/10 to 5/10 however pt declined, understanding the rigors of CIR, stating that "today is just a bad day." Pt's spouse in room and added that pt has had increased difficulty with breathing today and pt expressed may be due to worsening cough. Pt agreeable to OT trying tomorrow as able.   Asher Blade 09/24/2023, 12:44 PM

## 2023-09-25 ENCOUNTER — Inpatient Hospital Stay (HOSPITAL_COMMUNITY): Admitting: Anesthesiology

## 2023-09-25 ENCOUNTER — Encounter (HOSPITAL_COMMUNITY)
Admission: AD | Disposition: A | Discharge status: Home Health | Payer: Self-pay | Source: Ambulatory Visit | Attending: Thoracic Surgery (Cardiothoracic Vascular Surgery)

## 2023-09-25 ENCOUNTER — Other Ambulatory Visit: Payer: Self-pay

## 2023-09-25 ENCOUNTER — Encounter (HOSPITAL_COMMUNITY): Payer: Self-pay | Admitting: Thoracic Surgery (Cardiothoracic Vascular Surgery)

## 2023-09-25 DIAGNOSIS — Y844 Aspiration of fluid as the cause of abnormal reaction of the patient, or of later complication, without mention of misadventure at the time of the procedure: Secondary | ICD-10-CM

## 2023-09-25 DIAGNOSIS — K9189 Other postprocedural complications and disorders of digestive system: Secondary | ICD-10-CM | POA: Diagnosis not present

## 2023-09-25 DIAGNOSIS — I9789 Other postprocedural complications and disorders of the circulatory system, not elsewhere classified: Secondary | ICD-10-CM

## 2023-09-25 HISTORY — PX: FLEXIBLE BRONCHOSCOPY: SHX5094

## 2023-09-25 LAB — GLUCOSE, CAPILLARY
Glucose-Capillary: 100 mg/dL — ABNORMAL HIGH (ref 70–99)
Glucose-Capillary: 103 mg/dL — ABNORMAL HIGH (ref 70–99)
Glucose-Capillary: 111 mg/dL — ABNORMAL HIGH (ref 70–99)
Glucose-Capillary: 152 mg/dL — ABNORMAL HIGH (ref 70–99)
Glucose-Capillary: 210 mg/dL — ABNORMAL HIGH (ref 70–99)
Glucose-Capillary: 232 mg/dL — ABNORMAL HIGH (ref 70–99)
Glucose-Capillary: 328 mg/dL — ABNORMAL HIGH (ref 70–99)
Glucose-Capillary: 60 mg/dL — ABNORMAL LOW (ref 70–99)
Glucose-Capillary: 82 mg/dL (ref 70–99)

## 2023-09-25 SURGERY — BRONCHOSCOPY, FLEXIBLE
Anesthesia: General

## 2023-09-25 MED ORDER — ONDANSETRON HCL 4 MG/2ML IJ SOLN
INTRAMUSCULAR | Status: DC | PRN
  Administered 2023-09-25: 4 mg via INTRAVENOUS

## 2023-09-25 MED ORDER — FENTANYL CITRATE (PF) 250 MCG/5ML IJ SOLN
INTRAMUSCULAR | Status: AC
  Filled 2023-09-25: qty 5

## 2023-09-25 MED ORDER — OXYCODONE HCL 5 MG PO TABS: 5.0000 mg | ORAL_TABLET | Freq: Once | ORAL | Status: DC | PRN

## 2023-09-25 MED ORDER — LIDOCAINE HCL (PF) 4 % IJ SOLN
INTRAMUSCULAR | Status: AC
  Filled 2023-09-25: qty 5

## 2023-09-25 MED ORDER — PROPOFOL 10 MG/ML IV BOLUS
INTRAVENOUS | Status: DC | PRN
  Administered 2023-09-25: 130 mg via INTRAVENOUS
  Administered 2023-09-25: 40 mg via INTRAVENOUS

## 2023-09-25 MED ORDER — FENTANYL CITRATE (PF) 100 MCG/2ML IJ SOLN: 25.0000 ug | INTRAMUSCULAR | Status: DC | PRN

## 2023-09-25 MED ORDER — ORAL CARE MOUTH RINSE: 15.0000 mL | Freq: Once | OROMUCOSAL | Status: AC

## 2023-09-25 MED ORDER — PHENYLEPHRINE 80 MCG/ML (10ML) SYRINGE FOR IV PUSH (FOR BLOOD PRESSURE SUPPORT)
PREFILLED_SYRINGE | INTRAVENOUS | Status: DC | PRN
  Administered 2023-09-25 (×2): 160 ug via INTRAVENOUS

## 2023-09-25 MED ORDER — DEXAMETHASONE SODIUM PHOSPHATE 10 MG/ML IJ SOLN
INTRAMUSCULAR | Status: DC | PRN
  Administered 2023-09-25: 5 mg via INTRAVENOUS

## 2023-09-25 MED ORDER — MIDAZOLAM HCL 2 MG/2ML IJ SOLN
INTRAMUSCULAR | Status: AC
  Filled 2023-09-25: qty 2

## 2023-09-25 MED ORDER — DROPERIDOL 2.5 MG/ML IJ SOLN: 0.6250 mg | Freq: Once | INTRAMUSCULAR | Status: DC | PRN

## 2023-09-25 MED ORDER — MIDAZOLAM HCL 2 MG/2ML IJ SOLN
INTRAMUSCULAR | Status: DC | PRN
  Administered 2023-09-25: 1 mg via INTRAVENOUS

## 2023-09-25 MED ORDER — PROPOFOL 500 MG/50ML IV EMUL
INTRAVENOUS | Status: DC | PRN
  Administered 2023-09-25: 125 ug/kg/min via INTRAVENOUS

## 2023-09-25 MED ORDER — SUCCINYLCHOLINE CHLORIDE 200 MG/10ML IV SOSY
PREFILLED_SYRINGE | INTRAVENOUS | Status: DC | PRN
  Administered 2023-09-25: 120 mg via INTRAVENOUS

## 2023-09-25 MED ORDER — FENTANYL CITRATE (PF) 250 MCG/5ML IJ SOLN
INTRAMUSCULAR | Status: DC | PRN
  Administered 2023-09-25: 50 ug via INTRAVENOUS

## 2023-09-25 MED ORDER — ACETAMINOPHEN 10 MG/ML IV SOLN: 1000.0000 mg | Freq: Once | INTRAVENOUS | Status: DC | PRN

## 2023-09-25 MED ORDER — LIDOCAINE 2% (20 MG/ML) 5 ML SYRINGE
INTRAMUSCULAR | Status: DC | PRN
  Administered 2023-09-25: 20 mg via INTRAVENOUS

## 2023-09-25 MED ORDER — LACTATED RINGERS IV SOLN: INTRAVENOUS | Status: DC

## 2023-09-25 MED ORDER — PROPOFOL 10 MG/ML IV BOLUS
INTRAVENOUS | Status: AC
  Filled 2023-09-25: qty 20

## 2023-09-25 MED ORDER — CHLORHEXIDINE GLUCONATE 0.12 % MT SOLN
15.0000 mL | Freq: Once | OROMUCOSAL | Status: AC
  Administered 2023-09-25: 15 mL via OROMUCOSAL

## 2023-09-25 MED ORDER — DEXTROSE 50 % IV SOLN
INTRAVENOUS | Status: AC
  Filled 2023-09-25: qty 50

## 2023-09-25 MED ORDER — DEXTROSE 50 % IV SOLN
12.5000 g | INTRAVENOUS | Status: AC
  Administered 2023-09-25: 12.5 g via INTRAVENOUS

## 2023-09-25 MED ORDER — CHLORHEXIDINE GLUCONATE 0.12 % MT SOLN
OROMUCOSAL | Status: AC
Start: 2023-09-25 — End: 2023-09-25
  Filled 2023-09-25: qty 15

## 2023-09-25 MED ORDER — PHENYLEPHRINE HCL-NACL 20-0.9 MG/250ML-% IV SOLN
INTRAVENOUS | Status: DC | PRN
  Administered 2023-09-25: 40 ug/min via INTRAVENOUS

## 2023-09-25 MED ORDER — 0.9 % SODIUM CHLORIDE (POUR BTL) OPTIME
TOPICAL | Status: DC | PRN
  Administered 2023-09-25: 1000 mL

## 2023-09-25 MED ORDER — OXYCODONE HCL 5 MG/5ML PO SOLN: 5.0000 mg | Freq: Once | ORAL | Status: DC | PRN

## 2023-09-25 SURGICAL SUPPLY — 29 items
ADAPTER VALVE BIOPSY EBUS (MISCELLANEOUS) IMPLANT
BLADE CLIPPER SURG (BLADE) ×1 IMPLANT
CANISTER SUCTION 3000ML PPV (SUCTIONS) ×1 IMPLANT
CNTNR URN SCR LID CUP LEK RST (MISCELLANEOUS) ×1 IMPLANT
COVER BACK TABLE 60X90IN (DRAPES) ×1 IMPLANT
FILTER STRAW FLUID ASPIR (MISCELLANEOUS) IMPLANT
FORCEPS BIOP RJ4 1.8 (CUTTING FORCEPS) IMPLANT
GAUZE 4X4 16PLY ~~LOC~~+RFID DBL (SPONGE) ×1 IMPLANT
GAUZE SPONGE 4X4 12PLY STRL (GAUZE/BANDAGES/DRESSINGS) ×1 IMPLANT
GLOVE BIO SURGEON STRL SZ7 (GLOVE) ×1 IMPLANT
GLOVE BIO SURGEON STRL SZ7.5 (GLOVE) ×1 IMPLANT
GOWN STRL REUS W/ TWL XL LVL3 (GOWN DISPOSABLE) ×1 IMPLANT
KIT CLEAN ENDO COMPLIANCE (KITS) ×1 IMPLANT
KIT TURNOVER KIT B (KITS) ×1 IMPLANT
MARKER SKIN DUAL TIP RULER LAB (MISCELLANEOUS) ×1 IMPLANT
NS IRRIG 1000ML POUR BTL (IV SOLUTION) ×1 IMPLANT
OIL SILICONE PENTAX (PARTS (SERVICE/REPAIRS)) IMPLANT
STOPCOCK MORSE 400PSI 3WAY (MISCELLANEOUS) ×1 IMPLANT
SYR 10ML LL (SYRINGE) ×1 IMPLANT
SYR 20ML ECCENTRIC (SYRINGE) ×1 IMPLANT
TOWEL GREEN STERILE (TOWEL DISPOSABLE) ×1 IMPLANT
TOWEL GREEN STERILE FF (TOWEL DISPOSABLE) ×1 IMPLANT
TOWEL NATURAL 4PK STERILE (DISPOSABLE) ×1 IMPLANT
TRAP SPECIMEN MUCUS 40CC (MISCELLANEOUS) ×1 IMPLANT
TUBE CONNECTING 20X1/4 (TUBING) ×1 IMPLANT
UNDERPAD 30X36 HEAVY ABSORB (UNDERPADS AND DIAPERS) ×1 IMPLANT
VALVE BIOPSY SINGLE USE (MISCELLANEOUS) ×1 IMPLANT
VALVE SUCTION BRONCHIO DISP (MISCELLANEOUS) ×1 IMPLANT
WATER STERILE IRR 1000ML POUR (IV SOLUTION) ×1 IMPLANT

## 2023-09-25 NOTE — Anesthesia Procedure Notes (Signed)
 Procedure Name: Intubation Date/Time: 09/25/2023 11:55 AM  Performed by: Raymund Calix, CRNAPre-anesthesia Checklist: Patient identified, Emergency Drugs available, Suction available and Patient being monitored Patient Re-evaluated:Patient Re-evaluated prior to induction Oxygen Delivery Method: Circle system utilized Preoxygenation: Pre-oxygenation with 100% oxygen Induction Type: IV induction Ventilation: Mask ventilation without difficulty Laryngoscope Size: Mac and 4 Grade View: Grade I Tube type: Oral Tube size: 8.5 mm Number of attempts: 1 Airway Equipment and Method: Stylet and Oral airway Placement Confirmation: ETT inserted through vocal cords under direct vision, positive ETCO2 and breath sounds checked- equal and bilateral Secured at: 22 cm Tube secured with: Tape Dental Injury: Teeth and Oropharynx as per pre-operative assessment

## 2023-09-25 NOTE — Transfer of Care (Signed)
 Immediate Anesthesia Transfer of Care Note  Patient: Seth Burch  Procedure(s) Performed: Cordella Deter  Patient Location: PACU  Anesthesia Type:General  Level of Consciousness: drowsy and patient cooperative  Airway & Oxygen Therapy: Patient Spontanous Breathing and Patient connected to face mask oxygen  Post-op Assessment: Report given to RN and Post -op Vital signs reviewed and stable  Post vital signs: Reviewed and stable  Last Vitals:  Vitals Value Taken Time  BP 111/74 09/25/23 1227  Temp    Pulse 93 09/25/23 1229  Resp 13 09/25/23 1229  SpO2 95 % 09/25/23 1229  Vitals shown include unfiled device data.  Last Pain:  Vitals:   09/25/23 1110  TempSrc:   PainSc: 0-No pain      Patients Stated Pain Goal: 1 (09/22/23 1402)  Complications: No notable events documented.

## 2023-09-25 NOTE — Op Note (Signed)
      [address]       [name] [address]             [phone]                                          09/25/2023 Patient:  Seth Burch Pre-Op Dx: Hx of aspiration Hx of esophagectomy   Post-op Dx:  same Procedure: Flexible bronchoscopy    Surgeon and Role:      * Jackalyn Haith, Marinell Siad, MD - Primary   Anesthesia  general EBL:  none    Indications: 68yo male with chronic cough following esophagectomy.  He also has evidence of aspiration.  Toilet bronch performed.    Findings: Thin secretions.  Normal anatomy.  Operative Technique: The patient was brought to the endoscopy suite.  After anesthesia was induced, and time out was performed.  The video fiberoptic bronchoscope was introduced via the endotracheal tube and a general inspection was performed which showed normal right and left lung anatomy Aspiration of the bilateral mainstems was completed to remove any remaining secretions.  The patient tolerated the procedure without any immediate complications, and was taken to the PACU in stable condition.  Sarita Hakanson Ala [place]

## 2023-09-25 NOTE — Progress Notes (Signed)
 PT Cancellation Note  Patient Details Name: Seth Burch MRN: [id number] DOB: 05/04/1956   Cancelled Treatment:    Reason Eval/Treat Not Completed: (P) Patient at procedure or test/unavailable, pt off unit. Will check back as schedule allows to continue with PT POC.  Beverly Buckler. PTA Acute Rehabilitation Services Office: [phone]    [name] 09/25/2023, 11:29 AM

## 2023-09-25 NOTE — Plan of Care (Signed)

## 2023-09-25 NOTE — Anesthesia Preprocedure Evaluation (Addendum)
 Anesthesia Evaluation  Patient identified by MRN, date of birth, ID band Patient awake    Reviewed: Allergy & Precautions, Patient's Chart, lab work & pertinent test results  Airway Mallampati: III  TM Distance: >3 FB Neck ROM: Full    Dental  (+) Teeth Intact, Dental Advisory Given   Pulmonary neg pulmonary ROS   breath sounds clear to auscultation       Cardiovascular hypertension, Pt. on medications and Pt. on home beta blockers  Rhythm:Regular Rate:Normal     Neuro/Psych negative neurological ROS  negative psych ROS   GI/Hepatic Neg liver ROS,GERD  Medicated,,  Endo/Other  diabetes, Type 2, Oral Hypoglycemic Agents    Renal/GU Renal InsufficiencyRenal disease     Musculoskeletal  (+) Arthritis ,    Abdominal   Peds  Hematology  (+) Blood dyscrasia, anemia   Anesthesia Other Findings   Reproductive/Obstetrics                             Anesthesia Physical Anesthesia Plan  ASA: 2  Anesthesia Plan: General   Post-op Pain Management: Tylenol  PO (pre-op)*   Induction: Intravenous  PONV Risk Score and Plan: 3 and Ondansetron , Dexamethasone , Midazolam  and TIVA  Airway Management Planned: Oral ETT  Additional Equipment: None  Intra-op Plan:   Post-operative Plan: Extubation in OR  Informed Consent: I have reviewed the patients History and Physical, chart, labs and discussed the procedure including the risks, benefits and alternatives for the proposed anesthesia with the patient or authorized representative who has indicated his/her understanding and acceptance.       Plan Discussed with: CRNA  Anesthesia Plan Comments:        Anesthesia Quick Evaluation

## 2023-09-25 NOTE — Anesthesia Postprocedure Evaluation (Signed)
 Anesthesia Post Note  Patient: Seth Burch  Procedure(s) Performed: BRONCHOSCOPY, FLEXIBLE     Patient location during evaluation: PACU Anesthesia Type: General Level of consciousness: awake and alert Pain management: pain level controlled Vital Signs Assessment: post-procedure vital signs reviewed and stable Respiratory status: spontaneous breathing, nonlabored ventilation, respiratory function stable and patient connected to nasal cannula oxygen Cardiovascular status: blood pressure returned to baseline and stable Postop Assessment: no apparent nausea or vomiting Anesthetic complications: no  No notable events documented.  Last Vitals:  Vitals:   09/25/23 1245 09/25/23 1300  BP: 108/69 111/73  Pulse: 97 94  Resp: (!) 27 17  Temp:  37.5 C  SpO2: 91% 92%    Last Pain:  Vitals:   09/25/23 1300  TempSrc:   PainSc: Asleep                 Willian Harrow

## 2023-09-25 NOTE — Progress Notes (Addendum)
      [address]       Seth Burch [id number]             [phone]       Subjective: Rested better last night, less cough.  No new concerns.  TF is off and he is NPO for planned bronchoscopy later today.   Objective: Vital signs in last 24 hours: Temp:  [97.5 F (36.4 C)-98.5 F (36.9 C)] 98 F (36.7 C) (05/08 0319) Pulse Rate:  [86-104] 94 (05/08 0319) Cardiac Rhythm: Sinus tachycardia (05/07 1900) Resp:  [20-45] 20 (05/08 0319) BP: (118-129)/(72-77) 127/77 (05/08 0319) SpO2:  [86 %-96 %] 94 % (05/08 0319)  Intake/Output from previous day: 05/07 0701 - 05/08 0700 In: 353.6 [I.V.:10; NG/GT:313.6] Out: 645 [Urine:600; Chest Tube:45]  General appearance: alert, cooperative, and moderate no distress  Heart: regular rate and rhythm. SR with occasional ST. Lungs: normal work of breathing, breath sounds are clear. CT had no drainage recorded past 24 hours. Abdomen: soft, mildly tender, non-distended.  The J-tube insertion site is dressed with bulky dressings that are currently dry.  Extremities: extremities normal, atraumatic, no cyanosis or edema Wound: right chest tube site is clean and dry  Lab Results: Recent Labs    09/23/23 0500 09/24/23 0440  WBC 9.2 11.9*  HGB 7.1* 9.3*  HCT 22.8* 29.0*  PLT 392 422*   BMET:  Recent Labs    09/23/23 0500 09/24/23 0440  NA  --  137  K 5.0 4.6  CL  --  105  CO2  --  23  GLUCOSE  --  365*  BUN  --  60*  CREATININE  --  1.42*  CALCIUM   --  8.2*    PT/INR: No results for input(s): "LABPROT", "INR" in the last 72 hours. ABG    Component Value Date/Time   PHART 7.4 07/29/2023 0514   HCO3 25.8 08/02/2023 2006   TCO2 17 (L) 08/19/2023 2022   ACIDBASEDEF 1.0 08/02/2023 1506   O2SAT 94.5 08/02/2023 2006   CBG (last 3)  Recent Labs    09/24/23 2116 09/25/23 0007 09/25/23 0327  GLUCAP 223* 232* 100*    Assessment/Plan:  S/P Esophagectomy, complicated by Anastomotic Leak S/p Stent Placement 4/2.  EGD 5/5 with stent removal.  Inspection of esophageal lumen revealed persistent perforation. New esoph. stent placed.  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  Chyle leak persists, no chest tube output recordedl past 24 hours, leave CT in place Severe Protein Calorie Malnutrition- NPO and TF on hold Cough-  Dr. Deloise Ferries planning to evaluate with bronch later today. DM- CBG's better with adjustment in insulin  coverage as recommended by DM coordinator.  J Tube site inflamed, drainage persists, continue wound care Deconditioning-  continue PT/OT HEME- transfused with 1 unit PRBC's 5/6, Hgb 9.3 yesterday.  Check labe in AM.    LOS: 6 days   Seth G. Roddenberry, PA-C 09/25/2023   Agree Will bronch today Continue to optimize nutritional status  Bertrum Helmstetter O Chesley Veasey

## 2023-09-26 ENCOUNTER — Encounter (HOSPITAL_COMMUNITY): Payer: Self-pay | Admitting: Thoracic Surgery (Cardiothoracic Vascular Surgery)

## 2023-09-26 ENCOUNTER — Inpatient Hospital Stay (HOSPITAL_COMMUNITY)

## 2023-09-26 LAB — COMPREHENSIVE METABOLIC PANEL WITH GFR
ALT: 15 U/L (ref 0–44)
AST: 21 U/L (ref 15–41)
Albumin: 1.7 g/dL — ABNORMAL LOW (ref 3.5–5.0)
Alkaline Phosphatase: 134 U/L — ABNORMAL HIGH (ref 38–126)
Anion gap: 9 (ref 5–15)
BUN: 57 mg/dL — ABNORMAL HIGH (ref 8–23)
CO2: 23 mmol/L (ref 22–32)
Calcium: 8.3 mg/dL — ABNORMAL LOW (ref 8.9–10.3)
Chloride: 106 mmol/L (ref 98–111)
Creatinine, Ser: 1.41 mg/dL — ABNORMAL HIGH (ref 0.61–1.24)
GFR, Estimated: 55 mL/min — ABNORMAL LOW (ref >60)
Glucose, Bld: 327 mg/dL — ABNORMAL HIGH (ref 70–99)
Potassium: 5 mmol/L (ref 3.5–5.1)
Sodium: 138 mmol/L (ref 135–145)
Total Bilirubin: 0.4 mg/dL (ref 0.0–1.2)
Total Protein: 6.3 g/dL — ABNORMAL LOW (ref 6.5–8.1)

## 2023-09-26 LAB — GLUCOSE, CAPILLARY
Glucose-Capillary: 152 mg/dL — ABNORMAL HIGH (ref 70–99)
Glucose-Capillary: 155 mg/dL — ABNORMAL HIGH (ref 70–99)
Glucose-Capillary: 170 mg/dL — ABNORMAL HIGH (ref 70–99)
Glucose-Capillary: 229 mg/dL — ABNORMAL HIGH (ref 70–99)
Glucose-Capillary: 292 mg/dL — ABNORMAL HIGH (ref 70–99)
Glucose-Capillary: 387 mg/dL — ABNORMAL HIGH (ref 70–99)

## 2023-09-26 LAB — CBC
HCT: 29.6 % — ABNORMAL LOW (ref 39.0–52.0)
Hemoglobin: 9.4 g/dL — ABNORMAL LOW (ref 13.0–17.0)
MCH: 28.1 pg (ref 26.0–34.0)
MCHC: 31.8 g/dL (ref 30.0–36.0)
MCV: 88.6 fL (ref 80.0–100.0)
Platelets: 405 10*3/uL — ABNORMAL HIGH (ref 150–400)
RBC: 3.34 MIL/uL — ABNORMAL LOW (ref 4.22–5.81)
RDW: 16.2 % — ABNORMAL HIGH (ref 11.5–15.5)
WBC: 14 10*3/uL — ABNORMAL HIGH (ref 4.0–10.5)
nRBC: 0 % (ref 0.0–0.2)

## 2023-09-26 MED ORDER — KATE FARMS STANDARD 1.4 EN LIQD
1000.0000 mL | ENTERAL | Status: DC
  Administered 2023-09-26 – 2023-10-03 (×7): 1000 mL
  Filled 2023-09-26 (×12): qty 1000

## 2023-09-26 MED ORDER — PROSOURCE TF20 ENFIT COMPATIBL EN LIQD
60.0000 mL | Freq: Every day | ENTERAL | Status: DC
  Administered 2023-09-26 – 2023-10-14 (×19): 60 mL
  Filled 2023-09-26 (×20): qty 60

## 2023-09-26 MED ORDER — FREE WATER
150.0000 mL | Status: DC
  Administered 2023-09-26 – 2023-10-14 (×106): 150 mL

## 2023-09-26 MED ORDER — LACTULOSE 10 GM/15ML PO SOLN
20.0000 g | Freq: Once | ORAL | Status: DC
  Filled 2023-09-26: qty 30

## 2023-09-26 NOTE — Progress Notes (Signed)
 Called to see patient for abdominal pain after the TF and q4h free water  bolus were resumed this morning. He also complained of being more short of breath today. Mr. Swagger said pain is to the right of midline in mid abdomen and is constant. No nausea. No chest pain.  Abd is soft and flat. Has moderate mid abdominal tenderness extending to the RLQ.   I/P: suspect abd discomfort is related to restarting the TF and water  boluses at full rate after being off for ~ 24 hours. Will check CXR to evaluate the shortness of breath and also get a KUB. Treat abd pain with analgesics as already ordered. Monitor for now.   Pat Bonier, PA-C

## 2023-09-26 NOTE — Plan of Care (Signed)

## 2023-09-26 NOTE — Progress Notes (Addendum)
 Nutrition Follow-up  DOCUMENTATION CODES:  Severe malnutrition in context of chronic illness  INTERVENTION:  Modify TF via J-tube: [employer] 1.4 at 47ml/hr ( per day) 60ml ProSource TF20 once daily FWF 150ml q4 hours    TF at goal rate provides: 2264 kcals, 117g protein and 2023ml per day (TF + FWF)   Continue MVI with minerals daily  Monitor bowels, re-initiate nutrisource fiber BID to provide 3g soluble fiber per serving to aid in bulking of stool, if indicated Monitor for diet advancement and assess tolerance  NUTRITION DIAGNOSIS:  Severe Malnutrition related to chronic illness (esophageal cancer s/p chemo, radiation and surgery) as evidenced by severe muscle depletion, severe fat depletion, percent weight loss. - remains applicable  GOAL:  Patient will meet greater than or equal to 90% of their needs - meeting w/ TF at goal rate  MONITOR:  Diet advancement, TF tolerance, Weight trends, Labs, PO intake  REASON FOR ASSESSMENT:  Consult Enteral/tube feeding initiation and management  ASSESSMENT:  68 y.o Male with multiple readmissions due to complications after esophagectomy and J tube placement on 3/10 including, anastomosis leaks, dysphagia, chylothorax, FTT. Now presents with persistent coughing episodes, SOB, J tube drainage, esophageal anastomotic leak. PMH of DM, HTN, dyslipedemia, CKD, esophageal cancer, IgG kappa MGUS.  Previous Admission 07/28/2023-08/19/2023 3/10 EGD, XI Robotic Assisted Ivor Lewis Esophagectomy, J-tube placement 3/11 Trickle TF initiated 3/12 Surgery increased TF to 40 ml/hr 3/13 Surgery increased TF to 55 ml/hr, transferred to ICU  3/14 Surgery increased TF to 65 ml/hr (goal), bowel regimen added and +BM 3/15 Intubated, Bronch (Thick mucus in trachea suctioned, partially occluding L mainstem, ?aspiration, recurrent false vocal cord irritation with swelling leading to stridor and resp failure) 3/17 Extubated 3/18 Esophogram:  Brisk tracheal  aspiration of ingested contrast, into the LEFT mainstem bronchus. SLP eval recommended. Stagnant contrast at diaphragmatic hiatus. No anastomotic leak.  3/19 Diet advanced to Dysphagia 2/Thins, later downgraded to Nectar Thick Liquids 3/20 Transition to Nocturnal TF, family to bring protein shake in from home 3/21 Significant diarrhea overnight, (9 stools in 10 hours), significant J-tube drainage, change to Vivonex with day time feedings 3/24 Diet changed to Dysphagia 2, Thins per SLP 3/26 NPO due to concern for possible esophageal leak 3/27 advanced to Dysphagia 3, thin liquids  3/31 modify TF regimen for d/c 4/1 discharged home Previous Admission 08/19/2023-09/15/2023 4/2 admitted; OR: R VATS, EGD, and esophageal stent placement 4/3 trial Johny Nap TF formula during the day (16 hours), transfusion 4/4 advanced to goal rate; leaking J-tube, TF held  4/5 txr to cardiovascular ICU 4/7 TF re-initiated, transfused  4/9 J-tube replaced by IR 4/10 significant leaking from J-tube; TFs on hold  4/11 PICC placed, TPN initiated, J-tube replaced again, WOUND VAC 4/12 re-initiate trickles s/p second J-tube replacement 4/13 PICC line replaced 4/14 slowly advance TF goal rate 4/16 TF to goal rate; TPN discontinued 4/17 chylothorax: transition to Vivonex and very low fat diet 4/19 TF reduced due to feelings of fullness/diarrhea 4/21 TF rate increased to 44ml/hr 4/24 embolization of chyle leak 4/25 TF increased to 45ml/hr 4/28 discharged home Current Admission 5/2 Admitted Viovonex started @ 65 ml/hr, J tube drainage and chylothorax persists 5/5 EGD w/ esophageal stent replacement 5/6 transfused 5/8  bronchoscopy: no acute findings; evidence of aspiration 5/9 transitioned to [facility] 1.4  Chylothorax resolving. Vivonex restarted yesterday evening. Spoke with surgery via secure chat on 5/8 who is amicable changing to [employer] today to better meet estimated nutrition needs  now that fat  content can be increased. TF on hold for most of the day yesterday d/t bronchoscopy. Will transition to [employer] 1.4 today. Surgery recommending he remain NPO for time being. Okay to initiate TF at goal rate.  Monitor J-tube site for output. If J-tube continues to leak, would recommend TPN to allow for skin to heal around site.   Admit Weight: 73.9kg Current Weight: 73.9kg  Weight stable thus far this admission. However, he is significantly decreased from previous UBW. Has shown 11% weight loss in three months, which is considered clinically significant for the time frame. No edema noted.    Intake/Output Summary (Last 24 hours) at 09/26/2023 0901 Last data filed at 09/26/2023 0728 Gross per 24 hour  Intake 1246.17 ml  Output 460 ml  Net 786.17 ml    Net IO Since Admission: 1,420.5 mL [09/26/23 0901]   Drains/Lines: LUQ: Jejunostomy (20 Fr) R Lateral Pleural (19Fr): 10ml x24 hours UOP: x24 hours  Blood sugars significantly elevated this admission. Did have hypoglycemic episode yesterday while TF on hold. This is not baseline during previous admission on the same TF regimen. Consider underlying infectious etiology. Insulin  dosing adjusted. Will likely need another adjustment d/t formula transition that is lower in carbohydrate content.   Meds: famotidine , folic acid , SSI 0-5 at bedtime, SSI 7 units + 0-9 q4 hours, Semglee  24 BID, MCT oil, lactulose, MVI, KCl, thiamine    Of note, insulin  regimen during last admission: SSI 0-20 QID, SSI 3 q4 hours, Semglee  14 daily.   Labs:  Na+ 138 (wdl) K+ 3.4>3.6>4.0>5.0 (wdl) PHOS 3.5>5.7 (H) Mg 2.1 (wdl) Hgb 7.1 (L) CBGs 162-365 x48 hours A1c 7.1 (07/2023)  Diet Order:   Diet Order             Diet NPO time specified  Diet effective now            EDUCATION NEEDS:  Education needs have been addressed  Skin:  Skin Assessment: Skin Integrity Issues: Incisions: R chest tube Other: MASD around J-tube  Last BM:  5/5 - type 7  x1  Height:  Ht Readings from Last 1 Encounters:  09/25/23 5\' 10"  (1.778 m)   Weight:  Wt Readings from Last 1 Encounters:  09/25/23 73.9 kg   Ideal Body Weight:  75.5 kg  BMI:  Body mass index is 23.39 kg/m.  Estimated Nutritional Needs:   Kcal:  2200-2400 kcal  Protein:  115-130 gm  Fluid:  >2L/day  Con Decant MS, RD, LDN Registered Dietitian Clinical Nutrition RD Inpatient Contact Info in Amion

## 2023-09-26 NOTE — Progress Notes (Signed)
 Physical Therapy Treatment Patient Details Name: Seth Burch MRN: [id number] DOB: 1956-04-26 Today's Date: 09/26/2023   History of Present Illness 68 y.o. male presents to [facility] 09/19/23 after follow-up with Dr.Lightfoot with failure to thrive, low O2 sats/SOB and milky-output of chest tube. Pt with chylothorax, esophageal anastomotic leak, and J tube bilious drainage. EGD on 5/5 with stent removal and inspection of esophageal lumen revealed persistent perforation. New esoph. stent placed. [name] Extensive prior admits 3/10-4/1 and 4/1-4/28. PMHx: 3/10 esophagectomy, 4/2 R VATS, EGD, esophageal stent placement, esophageal adenocarcinoma, 4/24 chylothorax s/p coil/glue emoblization, HTN, DM    PT Comments  Pt received up OOB on BSC, agreeable to session, however pt limited by fatigue and weakness. Pt able to come to stand and step pivot BSC>EOB with light min A to maintain balance and provide steadying assist. Once seated EOB pt able to maintain ~1 min before needing to return to supine due to fatigue and discomfort. Pt educated on supine exercises to maintain LE strength and ROM with pt able to perform all and verbalizing understanding of completion throughout day. Exercises written on white board in room for pt to reference between therapies. Pt continues to benefit from skilled PT services to progress toward functional mobility goals.      If plan is discharge home, recommend the following: Assistance with cooking/housework;Assist for transportation;Help with stairs or ramp for entrance;A little help with walking and/or transfers;A little help with bathing/dressing/bathroom   Can travel by private vehicle        Equipment Recommendations  None recommended by PT    Recommendations for Other Services       Precautions / Restrictions Precautions Precautions: Fall Recall of Precautions/Restrictions: Intact Precaution/Restrictions Comments: L UQ jejunostomy, R chest tube Restrictions Weight  Bearing Restrictions Per Provider Order: No     Mobility  Bed Mobility Overal bed mobility: Needs Assistance Bed Mobility: Sit to Sidelying, Rolling Rolling: Contact guard assist       Sit to sidelying: Contact guard assist General bed mobility comments: min A to reposition once supine    Transfers Overall transfer level: Needs assistance Equipment used: None, 1 person hand held assist Transfers: Sit to/from Stand, Bed to chair/wheelchair/BSC Sit to Stand: Min assist   Step pivot transfers: Min assist       General transfer comment: min A to steady on rise from [facility], transitioning to [facility] for step pivot BSC to EOB    Ambulation/Gait               General Gait Details: pt declining stating due to fatigue   Stairs             Wheelchair Mobility     Tilt Bed    Modified Rankin (Stroke Patients Only)       Balance Overall balance assessment: Needs assistance, Mild deficits observed, not formally tested Sitting-balance support: Feet supported, No upper extremity supported Sitting balance-Leahy Scale: Good Sitting balance - Comments: Able to sit for 1 min before requesting to return to supine due to discomfort   Standing balance support: Reliant on assistive device for balance Standing balance-Leahy Scale: Poor Standing balance comment: reliant on external support                            Communication Communication Communication: No apparent difficulties Factors Affecting Communication: Difficulty expressing self  Cognition Arousal: Alert Behavior During Therapy: Anxious, Flat affect   PT - Cognitive impairments:  No apparent impairments                       PT - Cognition Comments: mildly anxious with RR up to 26 Following commands: Intact Following commands impaired: Follows one step commands inconsistently, Follows one step commands with increased time    Cueing Cueing Techniques: Verbal cues, Gestural cues, Tactile  cues, Visual cues  Exercises General Exercises - Lower Extremity Ankle Circles/Pumps: AROM, Both, 10 reps, Supine Quad Sets: AROM, Right, Left, 10 reps, Supine Gluteal Sets: AROM, Both, 10 reps, Supine Heel Slides: AROM, Right, Left, 5 reps, Supine Other Exercises Other Exercises: supine bridge x5    General Comments General comments (skin integrity, edema, etc.): RR up to 26 with trasnfer, SpO2 92-100% on supplemental O2 throughout session      Pertinent Vitals/Pain Pain Assessment Pain Assessment: Faces Faces Pain Scale: Hurts little more Pain Location: lateral aspect of L thigh (intermitently with add/abduction) Pain Descriptors / Indicators: Sharp, Shooting Pain Intervention(s): Monitored during session, Limited activity within patient's tolerance    Home Living                          Prior Function            PT Goals (current goals can now be found in the care plan section) Acute Rehab PT Goals Patient Stated Goal: to go home PT Goal Formulation: With patient/family Time For Goal Achievement: 10/04/23 Progress towards PT goals: Not progressing toward goals - comment (fatigue)    Frequency    Min 3X/week      PT Plan      Co-evaluation              AM-PAC PT "6 Clicks" Mobility   Outcome Measure  Help needed turning from your back to your side while in a flat bed without using bedrails?: A Little Help needed moving from lying on your back to sitting on the side of a flat bed without using bedrails?: A Little Help needed moving to and from a bed to a chair (including a wheelchair)?: A Little Help needed standing up from a chair using your arms (e.g., wheelchair or bedside chair)?: A Little Help needed to walk in hospital room?: Total Help needed climbing 3-5 steps with a railing? : Total 6 Click Score: 14    End of Session Equipment Utilized During Treatment: Oxygen Activity Tolerance: Patient limited by fatigue Patient left: in  bed;with call bell/phone within reach Nurse Communication: Mobility status PT Visit Diagnosis: Other abnormalities of gait and mobility (R26.89);Unsteadiness on feet (R26.81);Muscle weakness (generalized) (M62.81)     Time: 1610-9604 PT Time Calculation (min) (ACUTE ONLY): 15 min  Charges:    $Therapeutic Exercise: 8-22 mins PT General Charges $$ ACUTE PT VISIT: 1 Visit                     Oreoluwa Gilmer R. PTA Acute Rehabilitation Services Office: [phone]   [name] 09/26/2023, 9:47 AM

## 2023-09-26 NOTE — Progress Notes (Signed)
 Mobility Specialist Progress Note:   09/26/23 0919  Mobility  Activity Transferred to/from [facility]  Level of Assistance Minimal assist, patient does 75% or more  Assistive Device BSC  Distance Ambulated (ft) 4 ft  Activity Response Tolerated well  Mobility Referral Yes  Mobility visit 1 Mobility  Mobility Specialist Start Time (ACUTE ONLY) 0911  Mobility Specialist Stop Time (ACUTE ONLY) 0917  Mobility Specialist Time Calculation (min) (ACUTE ONLY) 6 min   Pt received in bed, requesting assistance to use BSC. Tolerated well, asx throughout. Left pt on BSC with call bell in reach. Encouraged pt to call for assistance to transfer back to bed. All needs met.    Nimah Uphoff Mobility Specialist Please contact via Special educational needs teacher or  Rehab office at [phone]

## 2023-09-26 NOTE — Inpatient Diabetes Management (Signed)
 Inpatient Diabetes Program Recommendations  AACE/ADA: New Consensus Statement on Inpatient Glycemic Control  Target Ranges:  Prepandial:   less than 140 mg/dL      Peak postprandial:   less than 180 mg/dL (1-2 hours)      Critically ill patients:  140 - 180 mg/dL    Latest Reference Range & Units 09/26/23 00:17 09/26/23 04:10 09/26/23 08:03  Glucose-Capillary 70 - 99 mg/dL 161 (H) 096 (H) 045 (H)    Latest Reference Range & Units 09/25/23 03:27 09/25/23 09:36 09/25/23 11:13 09/25/23 11:36 09/25/23 12:28 09/25/23 13:48 09/25/23 15:54 09/25/23 20:18  Glucose-Capillary 70 - 99 mg/dL 409 (H) 82 60 (L) 811 (H) 111 (H) 103 (H) 210 (H) 328 (H)   Review of Glycemic Control  Diabetes history: DM2 Outpatient Diabetes medications: Lantus  14 units daily, Jardiance 10 mg daily Current orders for Inpatient glycemic control: Semglee  24 units BID, Novolog  0-9 units TID with meals, Novolog  0-5 units at bedtime, Novolog  7 units Q4H; Vivonex @ 65 ml/hr  Inpatient Diabetes Program Recommendations:    Insulin : Tube feeding was on hold yesterday morning and CBG down to 60 mg/dl at 91:47 am. Morning dose of Semglee  was not given on 09/25/23 and patient received Decadron  5 mg at 11:56 am on 09/25/23. Due to not getting am Semglee  on 5/8 and receiving steroids, glucose was up to 387 mg/dl at 82:95 today. Would not recommend to change any insulin  orders at this time if tube feeding is continued as ordered.  Thanks, Beacher Limerick, RN, MSN, CDCES Diabetes Coordinator Inpatient Diabetes Program [phone] (Team Pager from 8am to 5pm)

## 2023-09-26 NOTE — Progress Notes (Signed)
 Inpatient Rehabilitation Admissions Coordinator   I continue to follow patient's medical workup and progress from a distance. We will follow up next week.  Jeannetta Millman, RN, MSN Rehab Admissions Coordinator [phone] 09/26/2023 12:17 PM

## 2023-09-26 NOTE — Progress Notes (Signed)
   09/26/23 1548  TOC Brief Assessment  Insurance and Status Reviewed  Patient has primary care physician Yes  Home environment has been reviewed home w/ wife  Prior level of function: self w/ assist  Prior/Current Home Services Current home services (Active w/ [facility], [facility] for home TF)  Social Drivers of Health Review SDOH reviewed no interventions necessary  Readmission risk has been reviewed Yes  Transition of care needs transition of care needs identified, TOC will continue to follow     We will continue to monitor patient advancement through interdisciplinary progression rounds. If new patient transition needs arise, please place a TOC consult.

## 2023-09-26 NOTE — Progress Notes (Addendum)
      [address]       [place] [address]             [phone]       Subjective: Rested OK last night, cough about the same but no intense episodes.  No new concerns.  TF has not yet been re-started.   Objective: Vital signs in last 24 hours: Temp:  [97.5 F (36.4 C)-99.5 F (37.5 C)] 97.5 F (36.4 C) (05/09 0411) Pulse Rate:  [77-103] 85 (05/09 0755) Cardiac Rhythm: Normal sinus rhythm (05/08 1949) Resp:  [12-35] 12 (05/09 0755) BP: (108-126)/(69-87) 118/77 (05/09 0755) SpO2:  [91 %-98 %] 96 % (05/09 0755) Weight:  [73.9 kg] 73.9 kg (05/08 1103)  Intake/Output from previous day: 05/08 0701 - 05/09 0700 In: 1246.2 [I.V.:10; NG/GT:1146.2] Out: 560 [Urine:550; Chest Tube:10]  General appearance: alert, cooperative, and no distress  Heart: regular rate and rhythm. SR with occasional ST. Lungs: normal work of breathing, breath sounds are clear. CT had 10ml drainage recorded for past 24 hours Abdomen: soft, mildly tender, non-distended.  The J-tube insertion site is dressed with bulky dressings that are currently dry.  Extremities: extremities normal, atraumatic, no cyanosis or edema Wound: right chest tube site is clean and dry  Lab Results: Recent Labs    09/24/23 0440 09/26/23 0500  WBC 11.9* 14.0*  HGB 9.3* 9.4*  HCT 29.0* 29.6*  PLT 422* 405*   BMET:  Recent Labs    09/24/23 0440 09/26/23 0500  NA 137 138  K 4.6 5.0  CL 105 106  CO2 23 23  GLUCOSE 365* 327*  BUN 60* 57*  CREATININE 1.42* 1.41*  CALCIUM  8.2* 8.3*    PT/INR: No results for input(s): "LABPROT", "INR" in the last 72 hours. ABG    Component Value Date/Time   PHART 7.4 07/29/2023 0514   HCO3 25.8 08/02/2023 2006   TCO2 17 (L) 08/19/2023 2022   ACIDBASEDEF 1.0 08/02/2023 1506   O2SAT 94.5 08/02/2023 2006   CBG (last 3)  Recent Labs    09/26/23 0017 09/26/23 0410 09/26/23 0803  GLUCAP 387* 292* 170*    Assessment/Plan:  S/P Esophagectomy, complicated by  Anastomotic Leak S/p Stent Placement 4/2. EGD 5/5 with stent removal.  Inspection of esophageal lumen revealed persistent perforation. New esoph. stent placed.  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  Chyle leak persists, no chest tube output recordedl past 24 hours, leave CT in place Severe Protein Calorie Malnutrition- to restart Seth Burch TF per RD recs today.  Cough-  Dr. Deloise Ferries did bronch yesterday showing evidence of aspiration but no sign of a fistula.  DM- had an episode of hypoglycemia yesterday treated with D50 while TF was on hold.   J Tube site inflamed, drainage persists, continue wound care Deconditioning-  continue PT/OT HEME- transfused with 1 unit PRBC's 5/6, Hgb stable at 9.4 .     LOS: 7 days   Seth G. Roddenberry, PA-C 09/26/2023  Agree Optimize nutrition NPO for now due to aspiration  Seth Burch

## 2023-09-26 NOTE — PMR Pre-admission (Shared)
 PMR Admission Coordinator Pre-Admission Assessment  Patient: Seth Burch is an 68 y.o., male MRN: [id number] DOB: [birth year] Height: 5\' 10"  (177.8 cm) Weight: 73.9 kg  Insurance Information HMO: ***    PPO: ***     PCP: ***     IPA: ***     80/20: ***     OTHER: *** PRIMARY: [employer]      Policy#: [id number]      Subscriber: pt CM Name: ***      Phone#: ***     Fax#: *** Pre-Cert#: ***      Employer: *** Benefits:  Phone #: [phone]     Name: *** Eff. Date: ***     Deduct: ***      Out of Pocket Max: ***      Life Max: *** CIR: ***      SNF: *** Outpatient: ***     Co-Pay: *** Home Health: ***      Co-Pay: *** DME: ***     Co-Pay: *** Providers: in network  SECONDARY: Medicare Part A only      Policy#: [id number] Passport one source online 5/9 active 10/18/21  Financial Counselor:       Phone#:   The "Data Collection Information Summary" for patients in Inpatient Rehabilitation Facilities with attached "Privacy Act Statement-Health Care Records" was provided and verbally reviewed with: Patient and Family  Emergency Contact Information Contact Information     Name Relation Home Work Mobile   [place] Spouse [phone]  [phone]   [place],Nicole Daughter   [phone]   Derrick,Brooks Daughter   [phone]      Other Contacts   None on File    Current Medical History  Patient Admitting Diagnosis: Debility, esophageal leak  History of Present Illness: 68 yo male with history of IgG kappa MGUS, DM, HTN, HLD, 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 S?p radiation and chemotherapy 1/25 followed by PET scan showed substantial improvement in hypermetabolic activity. Underwent robotic assisted esophagectomy and placement of jejunostomy tube 07/28/23. Complicated postop course with AKI, respiratory failure, delirium and aspiration pneumonia. Discharged home and presented same back day on  08/19/23 with right sided chest pain and purulent output form surgical drain. Admitted for concern for esophageal leak. Underwent VATS/EGD 08/20/23 with esophageal stent placement. Eventual discharge home on 09/15/23 with complicated course of chylothorax s/p coil embolization, glue  embolization of his thoracic duct.with chest tube for home, tube feedings at 65 ml/hr and dysphagia 1 diet.  He presented as OP MD follow up on 09/19/23. He had low O2 sats and was failure to thrive. He was readmitted to hospital 09/19/23. At home had minimal chest tube drainage, J tube drainage had increased and soaked through his dressings, CBGS were over 500 and poor po intake with coughing episodes.  Underwent EGD 09/22/23 with stent removal. Inspection of esophageal lumen revealed persistent perforation. New esophageal stent placed. Chyle leak persists and poor chest tube output. Chest tube remains. TF restarted for sever protein calorie malnutrition. Bronchoscopy completed 09/25/23 showed evidence of aspiration but no sign of fistula. J tube site inflamed , drainage persists and receiving local wound care. Following Hgb due to anemia and transfused as needed.   ***  Patient's medical record from [facility] has been reviewed by the rehabilitation admission coordinator and physician.  Past Medical History  Past Medical History:  Diagnosis Date   Arthritis    Cancer (HCC)  Chronic kidney disease    Diabetes mellitus (HCC)    Dyslipidemia    Hypertension    Has the patient had major surgery during 100 days prior to admission? Yes  Family History   family history is not on file.  Current Medications  Current Facility-Administered Medications:    acetaminophen  (TYLENOL ) tablet 650 mg, 650 mg, Per Tube, Q6H PRN **OR** acetaminophen  (TYLENOL ) suppository 650 mg, 650 mg, Rectal, Q6H PRN, Lightfoot, Harrell O, MD   amLODipine  (NORVASC ) tablet 10 mg, 10 mg, Per Tube, Daily, Marybelle Smiling, RPH, 10 mg at  09/26/23 1059   atorvastatin  (LIPITOR) tablet 20 mg, 20 mg, Per Tube, Daily, Marybelle Smiling, RPH, 20 mg at 09/26/23 1059   Chlorhexidine  Gluconate Cloth 2 % PADS 6 each, 6 each, Topical, Daily, Lightfoot, Marinell Siad, MD, 6 each at 09/25/23 1623   enoxaparin  (LOVENOX ) injection 40 mg, 40 mg, Subcutaneous, Q24H, Barrett, Erin R, PA-C, 40 mg at 09/25/23 1623   escitalopram  (LEXAPRO ) tablet 5 mg, 5 mg, Per Tube, Daily, Roddenberry, Myron G, PA-C, 5 mg at 09/26/23 1059   famotidine  (PEPCID ) tablet 20 mg, 20 mg, Per Tube, Daily, Bartley Lightning, MD, 20 mg at 09/26/23 1059   feeding supplement (KATE FARMS STANDARD ENT 1.4) liquid 1,000 mL, 1,000 mL, Per Tube, Continuous, Barrett, Erin R, PA-C, Last Rate: 65 mL/hr at 09/26/23 1052, 1,000 mL at 09/26/23 1052   feeding supplement (PROSource TF20) liquid 60 mL, 60 mL, Per Tube, Daily, Lightfoot, Harrell O, MD, 60 mL at 09/26/23 1058   folic acid  (FOLVITE ) tablet 1 mg, 1 mg, Per Tube, Daily, Marybelle Smiling, RPH, 1 mg at 09/26/23 1100   free water  150 mL, 150 mL, Per Tube, Q4H, Lightfoot, Harrell O, MD   Gerhardt's butt cream, , Topical, TID, Barrett, Erin R, PA-C, Given at 09/25/23 1624   insulin  aspart (novoLOG ) injection 0-5 Units, 0-5 Units, Subcutaneous, QHS, Barrett, Erin R, PA-C, 4 Units at 09/25/23 2118   insulin  aspart (novoLOG ) injection 0-9 Units, 0-9 Units, Subcutaneous, Q4H, Roddenberry, Myron G, PA-C, 2 Units at 09/26/23 0900   insulin  aspart (novoLOG ) injection 7 Units, 7 Units, Subcutaneous, Q4H, Roddenberry, Myron G, PA-C, 7 Units at 09/26/23 0900   insulin  glargine-yfgn (SEMGLEE ) injection 24 Units, 24 Units, Subcutaneous, BID, Roddenberry, Myron G, PA-C, 24 Units at 09/26/23 1103   lactulose (CHRONULAC) 10 GM/15ML solution 20 g, 20 g, Per Tube, Once, Roddenberry, Myron G, PA-C   liver oil-zinc  oxide (DESITIN) 40 % ointment, , Topical, TID PRN, Lightfoot, Harrell O, MD   medium chain triglycerides  (MCT OIL) oil 15 mL, 15 mL, Per Tube, TID,  Barrett, Erin R, PA-C, 15 mL at 09/26/23 1101   metoprolol  tartrate (LOPRESSOR ) tablet 25 mg, 25 mg, Per Tube, BID, Marybelle Smiling, RPH, 25 mg at 09/26/23 1059   morphine  (PF) 2 MG/ML injection 2 mg, 2 mg, Intravenous, Q2H PRN, Barrett, Erin R, PA-C, 2 mg at 09/24/23 1100   multivitamin with minerals tablet 1 tablet, 1 tablet, Per Tube, Daily, Marybelle Smiling, RPH, 1 tablet at 09/26/23 1059   ondansetron  (ZOFRAN ) tablet 4 mg, 4 mg, Per Tube, Q6H PRN **OR** ondansetron  (ZOFRAN ) injection 4 mg, 4 mg, Intravenous, Q6H PRN, Lightfoot, Harrell O, MD   oxyCODONE  (Oxy IR/ROXICODONE ) immediate release tablet 5 mg, 5 mg, Per Tube, Q4H PRN, Lightfoot, Harrell O, MD, 5 mg at 09/23/23 2130   potassium chloride  (KLOR-CON ) packet 40 mEq, 40 mEq, Per Tube, Daily, Barrett, Erin R, PA-C, 40 mEq  at 09/26/23 1100   sodium chloride  flush (NS) 0.9 % injection 10-40 mL, 10-40 mL, Intracatheter, Q12H, Lightfoot, Harrell O, MD, 10 mL at 09/25/23 2218   sodium chloride  flush (NS) 0.9 % injection 10-40 mL, 10-40 mL, Intracatheter, PRN, Lightfoot, Harrell O, MD, 30 mL at 09/23/23 0500   tamsulosin  (FLOMAX ) capsule 0.4 mg, 0.4 mg, Oral, Daily, Barrett, Erin R, PA-C, 0.4 mg at 09/21/23 1610   thiamine  (VITAMIN B1) tablet 100 mg, 100 mg, Per Tube, Daily, Marybelle Smiling, RPH, 100 mg at 09/26/23 1100  Patients Current Diet:  Diet Order             Diet NPO time specified  Diet effective now                  Precautions / Restrictions Precautions Precautions: Fall Precaution/Restrictions Comments: L UQ jejunostomy, R chest tube Restrictions Weight Bearing Restrictions Per Provider Order: No   Has the patient had 2 or more falls or a fall with injury in the past year? No  Prior Activity Level Household: Mod I with RW since last hospitalization due to chest tube and weakness; was Independent prior to last hospitalization  Prior Functional Level Self Care: Did the patient need help bathing, dressing, using  the toilet or eating? Needed some help  Indoor Mobility: Did the patient need assistance with walking from room to room (with or without device)? Independent  Stairs: Did the patient need assistance with internal or external stairs (with or without device)? Independent  Functional Cognition: Did the patient need help planning regular tasks such as shopping or remembering to take medications? Needed some help  Patient Information Are you of Hispanic, Latino/a,or Spanish origin?: A. No, not of Hispanic, Latino/a, or Spanish origin What is your race?: A. White Do you need or want an interpreter to communicate with a doctor or health care staff?: 0. No  Patient's Response To:  Health Literacy and Transportation Is the patient able to respond to health literacy and transportation needs?: Yes Health Literacy - How often do you need to have someone help you when you read instructions, pamphlets, or other written material from your doctor or pharmacy?: Never In the past 12 months, has lack of transportation kept you from medical appointments or from getting medications?: No In the past 12 months, has lack of transportation kept you from meetings, work, or from getting things needed for daily living?: No  Journalist, newspaper / Equipment Home Equipment: Shower seat, Agricultural consultant (2 wheels), BSC/3in1  Prior Device Use: Indicate devices/aids used by the patient prior to current illness, exacerbation or injury? Walker  Current Functional Level Cognition  Orientation Level: Oriented X4    Extremity Assessment (includes Sensation/Coordination)  Upper Extremity Assessment: Generalized weakness, Difficult to assess due to impaired cognition  Lower Extremity Assessment: Defer to PT evaluation    ADLs  Overall ADL's : Needs assistance/impaired General ADL Comments: Session limited due to significant confusion, will assess ADL management at next session    Mobility  Overal bed mobility: Needs  Assistance Bed Mobility: Sit to Sidelying, Rolling Rolling: Contact guard assist Sidelying to sit: Min assist, HOB elevated Supine to sit: Min assist, Used rails, HOB elevated Sit to supine: Contact guard assist Sit to sidelying: Contact guard assist General bed mobility comments: min A to reposition once supine    Transfers  Overall transfer level: Needs assistance Equipment used: None, 1 person hand held assist Transfers: Sit to/from Stand, Bed to chair/wheelchair/BSC Sit  to Stand: Min assist Bed to/from chair/wheelchair/BSC transfer type:: Step pivot Step pivot transfers: Min assist General transfer comment: min A to steady on rise from [facility], transitioning to [facility] for step pivot BSC to EOB    Ambulation / Gait / Stairs / Wheelchair Mobility  Ambulation/Gait General Gait Details: pt declining stating due to fatigue    Posture / Balance Dynamic Sitting Balance Sitting balance - Comments: Able to sit for 1 min before requesting to return to supine due to discomfort Balance Overall balance assessment: Needs assistance, Mild deficits observed, not formally tested Sitting-balance support: Feet supported, No upper extremity supported Sitting balance-Leahy Scale: Good Sitting balance - Comments: Able to sit for 1 min before requesting to return to supine due to discomfort Standing balance support: Reliant on assistive device for balance Standing balance-Leahy Scale: Poor Standing balance comment: reliant on external support    Special needs/care consideration   Chest tube right 19 fr placed 08/20/23 J tube 20 rd LUQ placed 08/29/23 Irritant dermatitis around J tube 08/28/23   Previous Home Environment  Living Arrangements: Spouse/significant other  Lives With: Spouse Available Help at Discharge: Family, Available 24 hours/day Type of Home: House Home Layout: One level Home Access: Stairs to enter Entrance Stairs-Rails: Right, Left, Can reach both Entrance Stairs-Number of Steps:  4 Bathroom Shower/Tub: Health visitor: Standard Bathroom Accessibility: Yes How Accessible: Accessible via walker Home Care Services: Yes Type of Home Care Services: Home PT, Home RN Home Care Agency (if known): [phone] Additional Comments: tube feeding supplies through [employer]  Discharge Living Setting Plans for Discharge Living Setting: Patient's home, Lives with (comment) (spouse) Type of Home at Discharge: House Discharge Home Layout: One level Discharge Home Access: Stairs to enter Entrance Stairs-Rails: Right, Left, Can reach both Entrance Stairs-Number of Steps: 4 Discharge Bathroom Shower/Tub: Walk-in shower Discharge Bathroom Toilet: Standard Discharge Bathroom Accessibility: Yes How Accessible: Accessible via walker Does the patient have any problems obtaining your medications?: No  Social/Family/Support Systems Patient Roles: Spouse Contact Information: wife, Melissa Anticipated Caregiver: wife Anticipated Caregiver's Contact Information: see contacts Ability/Limitations of Caregiver: no limitations Caregiver Availability: 24/7 Discharge Plan Discussed with Primary Caregiver: Yes Is Caregiver In Agreement with Plan?: Yes Does Caregiver/Family have Issues with Lodging/Transportation while Pt is in Rehab?: Yes  Goals Patient/Family Goal for Rehab: supervision to Mod I PT, supervision to Min OT Expected length of stay: ELOS 10 to 12 days Pt/Family Agrees to Admission and willing to participate: Yes Program Orientation Provided & Reviewed with Pt/Caregiver Including Roles  & Responsibilities: Yes  Decrease burden of Care through IP rehab admission: n/a  Possible need for SNF placement upon discharge: not anticipated  Patient Condition: I have reviewed medical records from [facility], spoken with CM, and patient and spouse. I met with patient at the bedside for inpatient rehabilitation assessment.  Patient will benefit from ongoing PT, OT, and  SLP, can actively participate in 3 hours of therapy a day 5 days of the week, and can make measurable gains during the admission.  Patient will also benefit from the coordinated team approach during an Inpatient Acute Rehabilitation admission.  The patient will receive intensive therapy as well as Rehabilitation physician, nursing, social worker, and care management interventions.  Due to bladder management, bowel management, safety, skin/wound care, disease management, medication administration, pain management, and patient education the patient requires 24 hour a day rehabilitation nursing.  The patient is currently *** with mobility and basic ADLs.  Discharge setting and therapy  post discharge at home with home health is anticipated.  Patient has agreed to participate in the Acute Inpatient Rehabilitation Program and will admit {Time; today/tomorrow:10263}.  Preadmission Screen Completed By:  Tanya Fantasia RN MSN, 09/26/2023 11:49 AM ______________________________________________________________________   Discussed status with Dr. [name] on *** at *** and received approval for admission today.  Admission Coordinator:  Tanya Fantasia, RN MSN, time Aaron AasAlanna Hu ***   Assessment/Plan: Diagnosis: *** Does the need for close, 24 hr/day Medical supervision in concert with the patient's rehab needs make it unreasonable for this patient to be served in a less intensive setting? {yes_no_potentially:3041433} Co-Morbidities requiring supervision/potential complications: *** Due to {due ZO:1096045}, does the patient require 24 hr/day rehab nursing? {yes_no_potentially:3041433} Does the patient require coordinated care of a physician, rehab nurse, PT, OT, and SLP to address physical and functional deficits in the context of the above medical diagnosis(es)? {yes_no_potentially:3041433} Addressing deficits in the following areas: {deficits:3041436} Can the patient actively participate in an intensive  therapy program of at least 3 hrs of therapy 5 days a week? {yes_no_potentially:3041433} The potential for patient to make measurable gains while on inpatient rehab is {potential:3041437} Anticipated functional outcomes upon discharge from inpatient rehab: {functional outcomes:304600100} PT, {functional outcomes:304600100} OT, {functional outcomes:304600100} SLP Estimated rehab length of stay to reach the above functional goals is: *** Anticipated discharge destination: {anticipated dc setting:21604} 10. Overall Rehab/Functional Prognosis: {potential:3041437}   MD Signature: ***

## 2023-09-27 LAB — GLUCOSE, CAPILLARY
Glucose-Capillary: 107 mg/dL — ABNORMAL HIGH (ref 70–99)
Glucose-Capillary: 118 mg/dL — ABNORMAL HIGH (ref 70–99)
Glucose-Capillary: 130 mg/dL — ABNORMAL HIGH (ref 70–99)
Glucose-Capillary: 152 mg/dL — ABNORMAL HIGH (ref 70–99)
Glucose-Capillary: 87 mg/dL (ref 70–99)

## 2023-09-27 MED ORDER — HYDROCOD POLI-CHLORPHE POLI ER 10-8 MG/5ML PO SUER
5.0000 mL | Freq: Four times a day (QID) | ORAL | Status: DC | PRN
  Administered 2023-09-27 – 2023-10-14 (×28): 5 mL
  Filled 2023-09-27 (×30): qty 5

## 2023-09-27 NOTE — Progress Notes (Signed)
      [address]       [place] [address]             [phone]       Subjective: Seth Burch he feels rough this morning.  Had several episodes of coughing last night.  Said he senses abdominal fullness since the tube feeding was restarted but the abdominal pain has resolved. Tolerating the tube feeding without nausea.  Objective: Vital signs in last 24 hours: Temp:  [97 F (36.1 C)-97.9 F (36.6 C)] 97 F (36.1 C) (05/10 0407) Pulse Rate:  [75-87] 87 (05/10 0809) Cardiac Rhythm: Normal sinus rhythm (05/09 1935) Resp:  [19-20] 20 (05/10 0809) BP: (114-124)/(70-81) 120/75 (05/10 0809) SpO2:  [94 %-100 %] 97 % (05/10 0809)  Intake/Output from previous day: 05/09 0701 - 05/10 0700 In: 1178.7 [NG/GT:1178.7] Out: 1135 [Urine:1100; Chest Tube:35]  General appearance: alert, cooperative, and no distress  Heart: regular rate and rhythm. SR with occasional ST. Lungs: normal work of breathing, breath sounds are clear. CT had 35ml drainage recorded for past 24 hours Abdomen: soft, nontender tender, non-distended.  The J-tube insertion site is dressed with bulky dressings that are currently dry.  Extremities: extremities normal, atraumatic, no cyanosis or edema Wound: right chest tube site is clean and dry  Lab Results: Recent Labs    09/26/23 0500  WBC 14.0*  HGB 9.4*  HCT 29.6*  PLT 405*   BMET:  Recent Labs    09/26/23 0500  NA 138  K 5.0  CL 106  CO2 23  GLUCOSE 327*  BUN 57*  CREATININE 1.41*  CALCIUM  8.3*    PT/INR: No results for input(s): "LABPROT", "INR" in the last 72 hours. ABG    Component Value Date/Time   PHART 7.4 07/29/2023 0514   HCO3 25.8 08/02/2023 2006   TCO2 17 (L) 08/19/2023 2022   ACIDBASEDEF 1.0 08/02/2023 1506   O2SAT 94.5 08/02/2023 2006   CBG (last 3)  Recent Labs    09/26/23 2014 09/27/23 0023 09/27/23 0413  GLUCAP 152* 118* 107*    Assessment/Plan:  S/P Esophagectomy, complicated by Anastomotic Leak S/p  Stent Placement 4/2. EGD 5/5 with stent removal.  Inspection of esophageal lumen revealed persistent perforation. New esoph. stent placed.  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  Chyle leak persists, 35ml chest tube output recordedl past 24 hours, leave CT in place Severe Protein Calorie Malnutrition-  on Johny Nap TF per RD recs today with water  boluses.  Seems to be tolerating okay. Cough-  Dr. Deloise Ferries evaluated with bronch on 09/25/2023.  There was evidence of aspiration but no sign of a fistula.  Will try Tussionex per J-tube for cough suppression. DM-glucose control has been better over the last 24 hours.  Continue insulin  at current dosing.   J Tube site inflamed, drainage persists, continue wound care Deconditioning-  continue PT/OT HEME- transfused with 1 unit PRBC's 5/6, Hgb stable at 9.4 .     LOS: 8 days   Seth Burch G. Laelle Bridgett, PA-C 09/27/2023

## 2023-09-27 NOTE — Plan of Care (Signed)

## 2023-09-27 NOTE — Progress Notes (Signed)
 0400 BS was 107 not transferring in the system

## 2023-09-28 LAB — BASIC METABOLIC PANEL WITH GFR
Anion gap: 9 (ref 5–15)
BUN: 53 mg/dL — ABNORMAL HIGH (ref 8–23)
CO2: 25 mmol/L (ref 22–32)
Calcium: 8.4 mg/dL — ABNORMAL LOW (ref 8.9–10.3)
Chloride: 106 mmol/L (ref 98–111)
Creatinine, Ser: 1.25 mg/dL — ABNORMAL HIGH (ref 0.61–1.24)
GFR, Estimated: >60 mL/min (ref >60)
Glucose, Bld: 89 mg/dL (ref 70–99)
Potassium: 4.5 mmol/L (ref 3.5–5.1)
Sodium: 140 mmol/L (ref 135–145)

## 2023-09-28 LAB — CBC
HCT: 34 % — ABNORMAL LOW (ref 39.0–52.0)
Hemoglobin: 10.5 g/dL — ABNORMAL LOW (ref 13.0–17.0)
MCH: 28.2 pg (ref 26.0–34.0)
MCHC: 30.9 g/dL (ref 30.0–36.0)
MCV: 91.2 fL (ref 80.0–100.0)
Platelets: 453 10*3/uL — ABNORMAL HIGH (ref 150–400)
RBC: 3.73 MIL/uL — ABNORMAL LOW (ref 4.22–5.81)
RDW: 16.9 % — ABNORMAL HIGH (ref 11.5–15.5)
WBC: 17.7 10*3/uL — ABNORMAL HIGH (ref 4.0–10.5)
nRBC: 0 % (ref 0.0–0.2)

## 2023-09-28 LAB — GLUCOSE, CAPILLARY
Glucose-Capillary: 114 mg/dL — ABNORMAL HIGH (ref 70–99)
Glucose-Capillary: 123 mg/dL — ABNORMAL HIGH (ref 70–99)
Glucose-Capillary: 128 mg/dL — ABNORMAL HIGH (ref 70–99)
Glucose-Capillary: 137 mg/dL — ABNORMAL HIGH (ref 70–99)
Glucose-Capillary: 80 mg/dL (ref 70–99)

## 2023-09-28 NOTE — Plan of Care (Signed)
   Problem: Education: Goal: Knowledge of General Education information will improve Description Including pain rating scale, medication(s)/side effects and non-pharmacologic comfort measures Outcome: Progressing

## 2023-09-28 NOTE — Progress Notes (Addendum)
      [address]       Seth Burch [address]             [phone]       Subjective:  Resting pretty well last night, said cough may be improved a little with the Tussionex.  No new complaints or concerns.  Bowel movement this morning.  Objective: Vital signs in last 24 hours: Temp:  [97.5 F (36.4 C)-97.8 F (36.6 C)] 97.6 F (36.4 C) (05/11 0746) Pulse Rate:  [81-95] 91 (05/11 0746) Cardiac Rhythm: Normal sinus rhythm (05/10 1900) Resp:  [17-24] 24 (05/11 0746) BP: (108-130)/(78-87) 130/84 (05/11 0746) SpO2:  [96 %-98 %] 96 % (05/11 0746)  Intake/Output from previous day: 05/10 0701 - 05/11 0700 In: 1830 [NG/GT:1560] Out: 920 [Urine:900; Chest Tube:20]  General appearance: alert, cooperative, and no distress  Heart: regular rate and rhythm. SR with occasional ST. Lungs: normal work of breathing, breath sounds are clear. CT had 20ml drainage recorded for past 24 hours Abdomen: soft, nontender tender, non-distended.  The J-tube insertion site is dressed with bulky dressings that are currently dry.  Extremities: extremities normal, atraumatic, no cyanosis or edema Wound: right chest tube site is clean and dry  Lab Results: Recent Labs    09/26/23 0500  WBC 14.0*  HGB 9.4*  HCT 29.6*  PLT 405*   BMET:  Recent Labs    09/26/23 0500  NA 138  K 5.0  CL 106  CO2 23  GLUCOSE 327*  BUN 57*  CREATININE 1.41*  CALCIUM  8.3*    PT/INR: No results for input(s): "LABPROT", "INR" in the last 72 hours. ABG    Component Value Date/Time   PHART 7.4 07/29/2023 0514   HCO3 25.8 08/02/2023 2006   TCO2 17 (L) 08/19/2023 2022   ACIDBASEDEF 1.0 08/02/2023 1506   O2SAT 94.5 08/02/2023 2006   CBG (last 3)  Recent Labs    09/27/23 2003 09/28/23 0048 09/28/23 0425  GLUCAP 87 137* 128*    Assessment/Plan:  S/P Esophagectomy, complicated by Anastomotic Leak S/p Stent Placement 4/2. EGD 5/5 with stent removal.  Inspection of esophageal lumen  revealed persistent perforation. New esoph. stent placed.  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  Chyle leak persists but seems to have slowed, 20ml chest tube output recorded past 24 hours, leave CT in place Severe Protein Calorie Malnutrition-  on Johny Nap TF per RD recs today with water  boluses.  Seems to be tolerating okay.  Glucose control has improved considerably with this formula. Cough-  Dr. Deloise Ferries evaluated with bronch on 09/25/2023.  There was evidence of aspiration but no sign of a fistula. Continue Tussionex per J-tube for cough suppression. DM-glucose control has been better over the last 24 hours.  Continue insulin  at current dosing.   J Tube site inflamed, drainage persists, continue wound care Deconditioning-  continue PT/OT HEME- transfused with 1 unit PRBC's 5/6, Hgb stable at 9.4 .   A.m. labs pending.   LOS: 9 days   [name], PA-C 09/28/2023  Patient seen and examined, agree with above Continue current care  [name], MD [facility] [phone]

## 2023-09-29 LAB — GLUCOSE, CAPILLARY
Glucose-Capillary: 101 mg/dL — ABNORMAL HIGH (ref 70–99)
Glucose-Capillary: 116 mg/dL — ABNORMAL HIGH (ref 70–99)
Glucose-Capillary: 118 mg/dL — ABNORMAL HIGH (ref 70–99)
Glucose-Capillary: 129 mg/dL — ABNORMAL HIGH (ref 70–99)
Glucose-Capillary: 135 mg/dL — ABNORMAL HIGH (ref 70–99)
Glucose-Capillary: 148 mg/dL — ABNORMAL HIGH (ref 70–99)
Glucose-Capillary: 61 mg/dL — ABNORMAL LOW (ref 70–99)
Glucose-Capillary: 95 mg/dL (ref 70–99)
Glucose-Capillary: 98 mg/dL (ref 70–99)

## 2023-09-29 MED ORDER — NUTRISOURCE FIBER PO PACK
1.0000 | PACK | Freq: Two times a day (BID) | ORAL | Status: DC
  Administered 2023-09-29 – 2023-10-14 (×29): 1
  Filled 2023-09-29 (×33): qty 1

## 2023-09-29 MED ORDER — INSULIN ASPART 100 UNIT/ML IJ SOLN
4.0000 [IU] | INTRAMUSCULAR | Status: DC
  Administered 2023-09-29 – 2023-10-01 (×8): 4 [IU] via SUBCUTANEOUS

## 2023-09-29 MED ORDER — DEXTROSE 50 % IV SOLN
INTRAVENOUS | Status: AC
Start: 2023-09-29 — End: 2023-09-29
  Administered 2023-09-29: 12.5 g via INTRAVENOUS
  Filled 2023-09-29: qty 50

## 2023-09-29 MED ORDER — FINASTERIDE 5 MG PO TABS
5.0000 mg | ORAL_TABLET | Freq: Every day | ORAL | Status: DC
  Administered 2023-09-29 – 2023-10-12 (×14): 5 mg via ORAL
  Filled 2023-09-29 (×15): qty 1

## 2023-09-29 MED ORDER — INSULIN GLARGINE-YFGN 100 UNIT/ML ~~LOC~~ SOLN
18.0000 [IU] | Freq: Two times a day (BID) | SUBCUTANEOUS | Status: DC
  Administered 2023-09-29 – 2023-10-03 (×8): 18 [IU] via SUBCUTANEOUS
  Filled 2023-09-29 (×12): qty 0.18

## 2023-09-29 MED ORDER — DEXTROSE 50 % IV SOLN: 12.5000 g | INTRAVENOUS | Status: AC

## 2023-09-29 NOTE — Consult Note (Signed)
 WOC Nurse wound follow up Wound type: irritant, contact dermatitis to peristomal skin Measurement: see previous note Wound bed: maceration decreased, less painful to touch Drainage moderate tan effluent from tube entry site.  Periwound: red  Dressing procedure/placement/frequency: Current dressing is intact, dressing lifted to view wound site and reapplied.  Continue current orders/interventions.  Gillermo Lack, RN, MSN, [facility] WOC Team

## 2023-09-29 NOTE — Progress Notes (Signed)
      [address]       [place] [address]             [phone]       Subjective:  Awake and alert, says he had a few episodes of coughing last night but overall was less than over past few days.  No new complaints or concerns.   Objective: Vital signs in last 24 hours: Temp:  [97.5 F (36.4 C)-97.6 F (36.4 C)] 97.6 F (36.4 C) (05/12 0358) Pulse Rate:  [78-91] 86 (05/12 0358) Cardiac Rhythm: Normal sinus rhythm (05/11 1900) Resp:  [18-24] 18 (05/12 0358) BP: (113-130)/(72-84) 123/77 (05/12 0358) SpO2:  [95 %-98 %] 96 % (05/12 0358) Weight:  [67.7 kg] 67.7 kg (05/12 0358)  Intake/Output from previous day: 05/11 0701 - 05/12 0700 In: 1376.8 [NG/GT:1376.8] Out: 1500 [Urine:1500]  General appearance: alert, cooperative, and no distress  Heart: regular rate and rhythm. SR with occasional ST. Lungs: normal work of breathing, breath sounds are clear. CT had no drainage recorded for past 24 hours Abdomen: soft, nontender tender, non-distended.  The J-tube insertion site is dressed with bulky dressings. Extremities: extremities normal, atraumatic, no cyanosis or edema Wound: right chest tube site is clean and dry  Lab Results: Recent Labs    09/28/23 1017  WBC 17.7*  HGB 10.5*  HCT 34.0*  PLT 453*   BMET:  Recent Labs    09/28/23 1017  NA 140  K 4.5  CL 106  CO2 25  GLUCOSE 89  BUN 53*  CREATININE 1.25*  CALCIUM  8.4*    PT/INR: No results for input(s): "LABPROT", "INR" in the last 72 hours. ABG    Component Value Date/Time   PHART 7.4 07/29/2023 0514   HCO3 25.8 08/02/2023 2006   TCO2 17 (L) 08/19/2023 2022   ACIDBASEDEF 1.0 08/02/2023 1506   O2SAT 94.5 08/02/2023 2006   CBG (last 3)  Recent Labs    09/28/23 2007 09/29/23 0052 09/29/23 0400  GLUCAP 114* 101* 118*    Assessment/Plan:  S/P Esophagectomy, complicated by Anastomotic Leak S/p Stent Placement 4/2. EGD 5/5 with stent removal.  Inspection of esophageal lumen  revealed persistent perforation. New esoph. stent placed.  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  There was no chest tube output recorded past 24 hours, leave CT in place and monitor. Severe Protein Calorie Malnutrition-  on Johny Nap TF per RD recs  with water  boluses.  Seems to be tolerating okay.  Glucose control much better with this formula. Cough-  Dr. Deloise Ferries evaluated with bronch on 09/25/2023.  There was evidence of aspiration but no sign of a fistula. Continue Tussionex per J-tube for cough suppression. DM-glucose control acceptable.  Continue insulin  at current dosing.   J Tube site inflamed, drainage persists, continue wound care Deconditioning-  continue PT/OT HEME- transfused with 1 unit PRBC's 5/6, Hgb stable at 9.4 .   A.m. labs pending.   LOS: 10 days   Seth Eckstein G. Shaquinta Peruski, PA-C 09/29/2023  Agree Pulm hygiene Continue tube feeds  Harrell O Lightfoot

## 2023-09-29 NOTE — Progress Notes (Signed)
 Nutrition Follow-up  DOCUMENTATION CODES:   Severe malnutrition in context of chronic illness  INTERVENTION:  Continue TF via J-tube: [employer] 1.4 at 72ml/hr ( per day) 60ml ProSource TF20 once daily FWF 150ml q4 hours    TF at goal rate provides: 2264 kcals, 117g protein and 2023ml per day (TF + FWF)   Continue MVI with minerals daily  Re-initiate nutrisource fiber BID to provide 3g soluble fiber per serving to aid in bulking of stool, if indicated Monitor for diet advancement and assess tolerance Discontinue MCT oil now that patient has successfully transitioned to [employer]  NUTRITION DIAGNOSIS:  Severe Malnutrition related to chronic illness (esophageal cancer s/p chemo, radiation and surgery) as evidenced by severe muscle depletion, severe fat depletion, percent weight loss. - remains applicable  GOAL:  Patient will meet greater than or equal to 90% of their needs - meeting w/ tube feeding  MONITOR:  Diet advancement, TF tolerance, Weight trends, Labs, PO intake  REASON FOR ASSESSMENT:  Consult Enteral/tube feeding initiation and management  ASSESSMENT:  68 y.o Male with multiple readmissions due to complications after esophagectomy and J tube placement on 3/10 including, anastomosis leaks, dysphagia, chylothorax, FTT. Now presents with persistent coughing episodes, SOB, J tube drainage, esophageal anastomotic leak. PMH of DM, HTN, dyslipedemia, CKD, esophageal cancer, IgG kappa MGUS.  Previous Admission 07/28/2023-08/19/2023 3/10 EGD, XI Robotic Assisted Ivor Lewis Esophagectomy, J-tube placement 3/11 Trickle TF initiated 3/12 Surgery increased TF to 40 ml/hr 3/13 Surgery increased TF to 55 ml/hr, transferred to ICU  3/14 Surgery increased TF to 65 ml/hr (goal), bowel regimen added and +BM 3/15 Intubated, Bronch (Thick mucus in trachea suctioned, partially occluding L mainstem, ?aspiration, recurrent false vocal cord irritation with swelling leading to stridor and  resp failure) 3/17 Extubated 3/18 Esophogram:  Brisk tracheal aspiration of ingested contrast, into the LEFT mainstem bronchus. SLP eval recommended. Stagnant contrast at diaphragmatic hiatus. No anastomotic leak.  3/19 Diet advanced to Dysphagia 2/Thins, later downgraded to Nectar Thick Liquids 3/20 Transition to Nocturnal TF, family to bring protein shake in from home 3/21 Significant diarrhea overnight, (9 stools in 10 hours), significant J-tube drainage, change to Vivonex with day time feedings 3/24 Diet changed to Dysphagia 2, Thins per SLP 3/26 NPO due to concern for possible esophageal leak 3/27 advanced to Dysphagia 3, thin liquids  3/31 modify TF regimen for d/c 4/1 discharged home Previous Admission 08/19/2023-09/15/2023 4/2 admitted; OR: R VATS, EGD, and esophageal stent placement 4/3 trial Johny Nap TF formula during the day (16 hours), transfusion 4/4 advanced to goal rate; leaking J-tube, TF held  4/5 txr to cardiovascular ICU 4/7 TF re-initiated, transfused  4/9 J-tube replaced by IR 4/10 significant leaking from J-tube; TFs on hold  4/11 PICC placed, TPN initiated, J-tube replaced again, WOUND VAC 4/12 re-initiate trickles s/p second J-tube replacement 4/13 PICC line replaced 4/14 slowly advance TF goal rate 4/16 TF to goal rate; TPN discontinued 4/17 chylothorax: transition to Vivonex and very low fat diet 4/19 TF reduced due to feelings of fullness/diarrhea 4/21 TF rate increased to 23ml/hr 4/24 embolization of chyle leak 4/25 TF increased to 4ml/hr 4/28 discharged home Current Admission 5/2 Admitted Viovonex started @ 65 ml/hr, J tube drainage and chylothorax persists 5/5 EGD w/ esophageal stent replacement 5/6 transfused 5/8  bronchoscopy: no acute findings; evidence of aspiration 5/9 transitioned to [facility] 1.4  Chylothorax resolving. No CT drainage recorded x24 hours. Successfully transitioned to [facility] and is running at goal rate today  when met with  patient at bedside. He continues to endorse some pressure in lower abdomen where TF is infusing. No N/V/C/D. Reports stools are becoming more loose. Will order Nutrisource fiber, as he was on previously, to promote bulking of stool. He had just coughed up significant amount of phlegm today during interaction. Called RN for cough medicine. Continues with bouts of coughing, however frequency is reducing. Seems in better spirits today.    Monitor J-tube site for output. If J-tube continues to leak, would recommend TPN to allow for skin to heal around site.   Per WOC note today, he continues with "moderate tan effluent from tube entry site," however maceration to wound site has decreased and is less painful to the touch.   Noted with dry mouth. Continues to consume ice chips to help. He is receiving free water  flushes to meet hydration needs. No s/s of thrush.     Admit Weight: 73.9kg Current Weight: 67.7kg   Shown with 8.4% weight loss with today's weight. This is significant as it appears to have occurred over the last 4 days. While this may be true body weight loss as Vivonex was not meeting his needs sufficiently, will recommend recollection of weight to assess trend. No edema noted.    Intake/Output Summary (Last 24 hours) at 09/29/2023 1709 Last data filed at 09/29/2023 1638 Gross per 24 hour  Intake 4612.59 ml  Output 1675 ml  Net 2937.59 ml    Net IO Since Admission: 5,425.59 mL [09/29/23 1709]     Drains/Lines: LUQ: Jejunostomy (20 Fr) R Lateral Pleural (19Fr): 10ml x24 hours UOP: + 1 unmeasured occurrence  x24 hours   Blood sugars improved with change in formula. Noted to have required dextrose  infusion today s/p blood sugar of 61. Insulin  dosing continues to be adjusted.    Meds: famotidine , folic acid ,  SSI 4 units + 0-9 q4 hours, Semglee  18 BID,  lactulose, MVI, KCl, thiamine  Drips: 12.5g D50W x1   Labs: No new labs for review  Diet Order:   Diet Order              Diet NPO time specified  Diet effective now            EDUCATION NEEDS:  Education needs have been addressed  Skin:  Skin Assessment: Skin Integrity Issues: Incisions: R chest tube Other: MASD around J-tube  Last BM:  5/12 - type 7 x2  Height:  Ht Readings from Last 1 Encounters:  09/25/23 5\' 10"  (1.778 m)   Weight:  Wt Readings from Last 1 Encounters:  09/29/23 67.7 kg   Ideal Body Weight:  75.5 kg  BMI:  Body mass index is 21.42 kg/m.  Estimated Nutritional Needs:   Kcal:  2200-2400 kcal  Protein:  115-130 gm  Fluid:  >2L/day  Con Decant MS, RD, LDN Registered Dietitian Clinical Nutrition RD Inpatient Contact Info in Amion

## 2023-09-29 NOTE — Progress Notes (Signed)
 PT Cancellation Note  Patient Details Name: Seth Burch MRN: [id number] DOB: 04/25/1956   Cancelled Treatment:    Reason Eval/Treat Not Completed: (P) Patient declined, no reason specified, pt declining mobility, stating he had just woken up from a nap. Verbally reviewed all HEP exercises. Will check back as schedule allows to continue with PT POC.  Beverly Buckler. PTA Acute Rehabilitation Services Office: [phone]    Seth Burch 09/29/2023, 4:02 PM

## 2023-09-29 NOTE — Progress Notes (Signed)
 Inpatient Rehab Admissions Coordinator:   Continuing to follow for potential CIR when patient medically ready and bed available.   Rehab Admissons Coordinator Lovie Zarling, [place], [place] [phone]

## 2023-09-29 NOTE — Progress Notes (Signed)
 CBG was 61, treated with IV dextrose . Rechecked CBG and 135.

## 2023-09-30 ENCOUNTER — Inpatient Hospital Stay (HOSPITAL_COMMUNITY)

## 2023-09-30 LAB — COMPREHENSIVE METABOLIC PANEL WITH GFR
ALT: 19 U/L (ref 0–44)
AST: 40 U/L (ref 15–41)
Albumin: 2 g/dL — ABNORMAL LOW (ref 3.5–5.0)
Alkaline Phosphatase: 177 U/L — ABNORMAL HIGH (ref 38–126)
Anion gap: 9 (ref 5–15)
BUN: 62 mg/dL — ABNORMAL HIGH (ref 8–23)
CO2: 22 mmol/L (ref 22–32)
Calcium: 8.3 mg/dL — ABNORMAL LOW (ref 8.9–10.3)
Chloride: 110 mmol/L (ref 98–111)
Creatinine, Ser: 1.83 mg/dL — ABNORMAL HIGH (ref 0.61–1.24)
GFR, Estimated: 40 mL/min — ABNORMAL LOW (ref >60)
Glucose, Bld: 166 mg/dL — ABNORMAL HIGH (ref 70–99)
Potassium: 5.8 mmol/L — ABNORMAL HIGH (ref 3.5–5.1)
Sodium: 141 mmol/L (ref 135–145)
Total Bilirubin: 0.7 mg/dL (ref 0.0–1.2)
Total Protein: 7.1 g/dL (ref 6.5–8.1)

## 2023-09-30 LAB — EXPECTORATED SPUTUM ASSESSMENT W GRAM STAIN, RFLX TO RESP C

## 2023-09-30 LAB — GLUCOSE, CAPILLARY
Glucose-Capillary: 103 mg/dL — ABNORMAL HIGH (ref 70–99)
Glucose-Capillary: 103 mg/dL — ABNORMAL HIGH (ref 70–99)
Glucose-Capillary: 153 mg/dL — ABNORMAL HIGH (ref 70–99)
Glucose-Capillary: 154 mg/dL — ABNORMAL HIGH (ref 70–99)
Glucose-Capillary: 77 mg/dL (ref 70–99)
Glucose-Capillary: 86 mg/dL (ref 70–99)

## 2023-09-30 LAB — URINALYSIS, ROUTINE W REFLEX MICROSCOPIC
Bilirubin Urine: NEGATIVE
Glucose, UA: NEGATIVE mg/dL
Hgb urine dipstick: NEGATIVE
Ketones, ur: NEGATIVE mg/dL
Leukocytes,Ua: NEGATIVE
Nitrite: NEGATIVE
Protein, ur: 30 mg/dL — AB
Specific Gravity, Urine: 1.017 (ref 1.005–1.030)
pH: 5 (ref 5.0–8.0)

## 2023-09-30 LAB — CBC
HCT: 35.1 % — ABNORMAL LOW (ref 39.0–52.0)
Hemoglobin: 10.8 g/dL — ABNORMAL LOW (ref 13.0–17.0)
MCH: 28.1 pg (ref 26.0–34.0)
MCHC: 30.8 g/dL (ref 30.0–36.0)
MCV: 91.4 fL (ref 80.0–100.0)
Platelets: 485 10*3/uL — ABNORMAL HIGH (ref 150–400)
RBC: 3.84 MIL/uL — ABNORMAL LOW (ref 4.22–5.81)
RDW: 17.2 % — ABNORMAL HIGH (ref 11.5–15.5)
WBC: 24.3 10*3/uL — ABNORMAL HIGH (ref 4.0–10.5)
nRBC: 0 % (ref 0.0–0.2)

## 2023-09-30 LAB — BASIC METABOLIC PANEL WITH GFR
Anion gap: 9 (ref 5–15)
Anion gap: 10 (ref 5–15)
BUN: 47 mg/dL — ABNORMAL HIGH (ref 8–23)
BUN: 66 mg/dL — ABNORMAL HIGH (ref 8–23)
CO2: 24 mmol/L (ref 22–32)
CO2: 22 mmol/L (ref 22–32)
Calcium: 8.2 mg/dL — ABNORMAL LOW (ref 8.9–10.3)
Calcium: 8 mg/dL — ABNORMAL LOW (ref 8.9–10.3)
Chloride: 110 mmol/L (ref 98–111)
Chloride: 110 mmol/L (ref 98–111)
Creatinine, Ser: 1.34 mg/dL — ABNORMAL HIGH (ref 0.61–1.24)
Creatinine, Ser: 1.89 mg/dL — ABNORMAL HIGH (ref 0.61–1.24)
GFR, Estimated: 58 mL/min — ABNORMAL LOW (ref >60)
GFR, Estimated: 39 mL/min — ABNORMAL LOW (ref >60)
Glucose, Bld: 131 mg/dL — ABNORMAL HIGH (ref 70–99)
Glucose, Bld: 80 mg/dL (ref 70–99)
Potassium: 5.1 mmol/L (ref 3.5–5.1)
Potassium: 4.8 mmol/L (ref 3.5–5.1)
Sodium: 143 mmol/L (ref 135–145)
Sodium: 142 mmol/L (ref 135–145)

## 2023-09-30 LAB — PREALBUMIN: Prealbumin: 21 mg/dL (ref 18–38)

## 2023-09-30 MED ORDER — VANCOMYCIN VARIABLE DOSE PER UNSTABLE RENAL FUNCTION (PHARMACIST DOSING): Status: DC

## 2023-09-30 MED ORDER — PIPERACILLIN-TAZOBACTAM 3.375 G IVPB
3.3750 g | Freq: Three times a day (TID) | INTRAVENOUS | Status: DC
  Administered 2023-09-30 – 2023-10-03 (×9): 3.375 g via INTRAVENOUS
  Filled 2023-09-30 (×9): qty 50

## 2023-09-30 MED ORDER — VANCOMYCIN HCL 1500 MG/300ML IV SOLN
1500.0000 mg | Freq: Once | INTRAVENOUS | Status: AC
  Administered 2023-09-30: 1500 mg via INTRAVENOUS
  Filled 2023-09-30: qty 300

## 2023-09-30 MED ORDER — SODIUM ZIRCONIUM CYCLOSILICATE 10 G PO PACK
10.0000 g | PACK | ORAL | Status: AC
  Administered 2023-09-30: 10 g via ORAL
  Filled 2023-09-30: qty 1

## 2023-09-30 NOTE — Progress Notes (Signed)
 The CMP obtained late this morning showed improvement in albumin  from 1.7-2.0 but also is concerning for worsening renal function.  Serum potassium was elevated at 5.8 and Creat continues to rise from 1.34-1.83 within about 6 hours. Ordered Lokelma and will repeat the BMP at 6 PM today.  Starting empiric Zosyn  and vancomycin  for worsening leukocytosis.   Chest x-ray obtained earlier today shows improvement in the right basilar airspace disease and no new findings otherwise. Urinalysis showed some proteinuria but is leukocyte negative and had only few bacteria. On exam, Seth Burch remains afebrile.  Heart rate was 120 and respiratory rate 41 earlier today but he was just evaluated and his heart rate was 103 with respiratory rate in the 20s.  Oxygen saturations are stable.  Has some expected abdominal soreness but this is not changed over the past several days.  The dressing over the JP tube insertion site was pulled back and showing the usual inflammation and maceration of the skin locally but no evidence of abscess at the site.   Will continue supportive care and monitor.  Repeat BMP this evening and CBC with BMP tomorrow morning

## 2023-09-30 NOTE — Progress Notes (Addendum)
 Occupational Therapy Treatment Patient Details Name: Seth Burch MRN: [id number] DOB: May 06, 1956 Today's Date: 09/30/2023   History of present illness 68 y.o. male [facility] after follow-up with Dr.Lightfoot with failure to thrive, low O2 sats/SOB and milky-output of chest tube. Pt with chylothorax, esophageal anastomotic leak, and J tube bilious drainage. EGD on 5/5 with stent removal and inspection of esophageal lumen revealed persistent perforation. New esoph. stent placed. [name] Extensive prior admits 3/10-4/1 and 4/1-4/28. PMHx: 3/10 esophagectomy, 4/2 R VATS, EGD, esophageal stent placement, esophageal adenocarcinoma, 4/24 chylothorax s/p coil/glue emoblization, HTN, DM   OT comments  Session limijted due to fatigue, impaired activity tolerance. Pt required max multimodal cues to initiate sitting EOB with min A for LE mgt. Pt seemed to have difficulty expressing himself as well as giving delayed responses to questions and delayed initiation with following commands, multimodal cues required throughout. Pt reports feeling SOB O2 SATs 92-96% at that time. O2 did  drop to 80s while seated EOB, RN turned O2 up from 2L to 4L with SATs increasing to 95-96%., HR 112-121. Pt sat EOB ~6-8 minutes and was able to wash face/hands, donning gown and LB bathing tasks. OT will continue to follow acutely to maximize level of function and safety      If plan is discharge home, recommend the following:  A little help with walking and/or transfers;A lot of help with bathing/dressing/bathroom;Assistance with cooking/housework;Direct supervision/assist for medications management;Direct supervision/assist for financial management;Assist for transportation;Help with stairs or ramp for entrance;Supervision due to cognitive status   Equipment Recommendations  Other (comment) (defer)    Recommendations for Other Services      Precautions / Restrictions Precautions Precautions: Fall Recall of  Precautions/Restrictions: Intact Precaution/Restrictions Comments: L UQ jejunostomy, R chest tube Restrictions Weight Bearing Restrictions Per Provider Order: No       Mobility Bed Mobility Overal bed mobility: Needs Assistance       Supine to sit: Min assist, Used rails, HOB elevated Sit to supine: Contact guard assist   General bed mobility comments: min A with LEs to intitiate, extra time to initiate and multimodal cues for scooting to EOB    Transfers                   General transfer comment: declined standing/SPTs     Balance Overall balance assessment: Needs assistance, Mild deficits observed, not formally tested Sitting-balance support: Feet supported, No upper extremity supported Sitting balance-Leahy Scale: Fair Sitting balance - Comments: sat EOB x 6 minutes                                   ADL either performed or assessed with clinical judgement   ADL Overall ADL's : Needs assistance/impaired     Grooming: Wash/dry hands;Wash/dry face;Contact guard assist;Sitting       Lower Body Bathing: Minimal assistance;Sitting/lateral leans   Upper Body Dressing : Contact guard assist;Sitting                     General ADL Comments: Session limited due to significant confusion, decreased activity tolerance    Extremity/Trunk Assessment Upper Extremity Assessment Upper Extremity Assessment: Generalized weakness   Lower Extremity Assessment Lower Extremity Assessment: Defer to PT evaluation        Vision Ability to See in Adequate Light: 0 Adequate Patient Visual Report: No change from baseline     Perception  Praxis     Communication Communication Communication: No apparent difficulties Factors Affecting Communication: Difficulty expressing self   Cognition Arousal: Alert Behavior During Therapy: Anxious, Flat affect Cognition: Cognition impaired   Orientation impairments: Situation, Time     Attention  impairment (select first level of impairment): Alternating attention Executive functioning impairment (select all impairments): Initiation, Organization, Sequencing, Reasoning, Problem solving OT - Cognition Comments: Pt seemed to have difficulty expressing himself as well as giving delayed responses to questions and delayed initiation with following commands, multimodal cues required                   Following commands impaired: Follows one step commands inconsistently, Follows one step commands with increased time      Cueing   Cueing Techniques: Verbal cues, Gestural cues, Tactile cues, Visual cues  Exercises      Shoulder Instructions       General Comments      Pertinent Vitals/ Pain       Pain Assessment Pain Assessment: Faces Faces Pain Scale: Hurts little more Pain Location: lateral aspect of L thigh during sup-sit, subsided once sitting EOB Pain Descriptors / Indicators: Sharp, Shooting Pain Intervention(s): Monitored during session, Repositioned, Limited activity within patient's tolerance  Home Living                                          Prior Functioning/Environment              Frequency  Min 2X/week        Progress Toward Goals  OT Goals(current goals can now be found in the care plan section)  Progress towards OT goals: OT to reassess next treatment     Plan      Co-evaluation                 AM-PAC OT "6 Clicks" Daily Activity     Outcome Measure   Help from another person eating meals?: None Help from another person taking care of personal grooming?: A Little Help from another person toileting, which includes using toliet, bedpan, or urinal?: A Lot Help from another person bathing (including washing, rinsing, drying)?: A Lot Help from another person to put on and taking off regular upper body clothing?: A Little Help from another person to put on and taking off regular lower body clothing?: A Lot 6  Click Score: 16    End of Session Equipment Utilized During Treatment: Oxygen  OT Visit Diagnosis: Unsteadiness on feet (R26.81);Muscle weakness (generalized) (M62.81);Other symptoms and signs involving cognitive function;Pain Pain - Right/Left: Left Pain - part of body:  (thigh)   Activity Tolerance Patient limited by fatigue;Other (comment) (confusion)   Patient Left in bed;with call bell/phone within reach;with bed alarm set   Nurse Communication          Time: 1336-1401 OT Time Calculation (min): 25 min  Charges: OT General Charges $OT Visit: 1 Visit OT Treatments $Self Care/Home Management : 8-22 mins $Therapeutic Activity: 8-22 mins    Alfred Ann 09/30/2023, 2:52 PM

## 2023-09-30 NOTE — Progress Notes (Signed)
 Inpatient Rehabilitation Admissions Coordinator   I met with patient, wife and eldest daughter at bedside. Wife states surgeon is looking to discharge him home likely end of week once mobility has improved as well as nutrition. Wife also reports continued issues with [employer] and his tube feeding  supplies. I will alert TOC of [employer] issues. We will sign off.  Jeannetta Millman, RN, MSN Rehab Admissions Coordinator [phone] 09/30/2023 10:58 AM

## 2023-09-30 NOTE — Progress Notes (Signed)
 Mobility Specialist Progress Note:   09/30/23 0919  Mobility  Activity Transferred from chair to bed  Level of Assistance Minimal assist, patient does 75% or more  Assistive Device Front wheel walker  Distance Ambulated (ft) 5 ft  Activity Response Tolerated well  Mobility Referral Yes  Mobility visit 1 Mobility  Mobility Specialist Start Time (ACUTE ONLY) 0912  Mobility Specialist Stop Time (ACUTE ONLY) 0919  Mobility Specialist Time Calculation (min) (ACUTE ONLY) 7 min   Pt received in chair, requesting to transfer back to bed. Required MinA and RW to stand from chair. Tolerated well, asx throughout. Left pt lying comfortably in bed with all needs met.    Jaylyn Iyer Mobility Specialist Please contact via Special educational needs teacher or  Rehab office at [phone]

## 2023-09-30 NOTE — Progress Notes (Signed)
 PT Cancellation Note  Patient Details Name: Seth Burch MRN: [id number] DOB: 12-28-1955   Cancelled Treatment:    Reason Eval/Treat Not Completed: (P) Fatigue/lethargy limiting ability to participate, pt declining mobility as having just worked with OT and with increased fatigue. Pt agreeable for this PTA to return in AM with plan made for 8:15am for optimal pt engagement.   Beverly Buckler. PTA Acute Rehabilitation Services Office: [phone]    [name] 09/30/2023, 4:24 PM

## 2023-09-30 NOTE — TOC Progression Note (Addendum)
 Transition of Care (TOC) - Progression Note  Sherin Dingwall RN, BSN Transitions of Care Unit 4E- RN Case Manager See Treatment Team for direct phone #   Patient Details  Name: Seth Burch MRN: [id number] Date of Birth: 1955-11-03  Transition of Care [facility]) CM/SW Contact  Jearld Min, Myrtle Atta, RN Phone Number: 09/30/2023, 12:13 PM  Clinical Narrative:    Received msg that pt's spouse and daughter needed to speak with CM again regarding home TF issues with [employer] and returning supply they have at home.   CM went to room and spoke with spouse- Melissa, daughter-Nicole. Pt also present.  Spouse and Melissa voiced that they have reached out to [employer] to try and return the TF cases that they have at home, Daughter is going to reach back out to [employer] to assist in coordinating the return and refund of the un-used home TF to [employer]. Per discussion- pt is now on a difference TF and will likely go home on the currently TF. Explained that CM will work with [employer] on the new TF needs for discharge. Family will need to speak with [employer] regarding billing questions and insurance coverage.   Confirmed with family that they want to continue [facility] services with [facility], they state that they would like to have RN-Richard back if able. CM will follow up with [facility] closer to discharge for resumption of services- will need new orders for [facility] (RN/PT)   CM reached out to RD for home TF needs- will need updated note closer to discharge with new home TF recommendations. CM to follow up.   Daughter Peterson Brandt contact is- [phone].    Expected Discharge Plan: Home w Home Health Services Barriers to Discharge: Continued Medical Work up  Expected Discharge Plan and Services   Discharge Planning Services: CM Consult Post Acute Care Choice: Home Health, Resumption of Svcs/PTA Provider Living arrangements for the past 2 months: Single Family Home                                       Social Determinants  of Health (SDOH) Interventions SDOH Screenings   Food Insecurity: No Food Insecurity (09/20/2023)  Housing: Low Risk  (09/20/2023)  Transportation Needs: No Transportation Needs (09/20/2023)  Utilities: Not At Risk (09/20/2023)  Depression (PHQ2-9): Low Risk  (06/12/2023)  Social Connections: Moderately Integrated (09/20/2023)  Tobacco Use: Low Risk  (09/25/2023)    Readmission Risk Interventions    09/15/2023   12:04 PM  Readmission Risk Prevention Plan  Transportation Screening Complete  PCP or Specialist Appt within 5-7 Days Complete  Home Care Screening Complete  Medication Review (RN CM) Complete

## 2023-09-30 NOTE — Progress Notes (Signed)
 The patient has started saying that he's having difficulty breathing this morning. His heart rate has gone to 120-130 and his respirations increased to mid 20s to low 30s. Increased O2 to 3 L which sats at 94%.  Right lower lungs sound more diminished.  Will inform oncoming nurse and PA if able.  Hilton Lucky, RN

## 2023-09-30 NOTE — Progress Notes (Addendum)
      [address]       [place] [address]             [phone]       Subjective:  In the bedside chair.  No new concerns today. Had coughing again last night but less since sitting up in the chair.    Objective: Vital signs in last 24 hours: Temp:  [96.7 F (35.9 C)-97.9 F (36.6 C)] 97.9 F (36.6 C) (05/13 0419) Pulse Rate:  [84-129] 129 (05/13 0639) Cardiac Rhythm: Normal sinus rhythm (05/12 1900) Resp:  [18-22] 22 (05/13 0639) BP: (104-131)/(73-92) 123/92 (05/13 0419) SpO2:  [94 %-99 %] 94 % (05/13 0639)  Intake/Output from previous day: 05/12 0701 - 05/13 0700 In: 4919.8 [P.O.:120; I.V.:10; NG/GT:4639.8] Out: 1120 [Urine:1100; Chest Tube:20]  General appearance: alert, cooperative, and no distress  Heart: regular rate and rhythm. SR with occasional ST. Lungs: breathing seems more labored today, breath sounds are clear on the left, and has coarse crackles on the right. CT had no drainage recorded for past 48  hours Abdomen: soft, nontender tender, non-distended.  The J-tube insertion site is dressed with bulky dressings. Extremities: extremities normal, atraumatic, no cyanosis or edema Wound: right chest tube site is clean and dry  Lab Results: Recent Labs    09/28/23 1017 09/30/23 0458  WBC 17.7* 24.3*  HGB 10.5* 10.8*  HCT 34.0* 35.1*  PLT 453* 485*   BMET:  Recent Labs    09/28/23 1017 09/30/23 0458  NA 140 143  K 4.5 5.1  CL 106 110  CO2 25 24  GLUCOSE 89 131*  BUN 53* 47*  CREATININE 1.25* 1.34*  CALCIUM  8.4* 8.2*    PT/INR: No results for input(s): "LABPROT", "INR" in the last 72 hours. ABG    Component Value Date/Time   PHART 7.4 07/29/2023 0514   HCO3 25.8 08/02/2023 2006   TCO2 17 (L) 08/19/2023 2022   ACIDBASEDEF 1.0 08/02/2023 1506   O2SAT 94.5 08/02/2023 2006   CBG (last 3)  Recent Labs    09/29/23 2157 09/29/23 2358 09/30/23 0421  GLUCAP 129* 116* 103*    Assessment/Plan:  S/P Esophagectomy,  complicated by Anastomotic Leak S/p Stent Placement 4/2. EGD 5/5 with stent removal.  Inspection of esophageal lumen revealed persistent perforation. New esoph. stent placed.  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  There was no chest tube output recorded past 24 hours, leave CT in place and monitor. Severe Protein Calorie Malnutrition-  on Johny Nap TF per RD recs  with water  boluses.  Seems to be tolerating okay.  Glucose control much better with this formula. Cough-  Dr. Deloise Ferries evaluated with bronch on 09/25/2023.  There was evidence of aspiration but no sign of a fistula. Now having gradually increasing WBC.  Re-checking the CXR this morning. Will culture sputum and need to consider starting empiric ABX.  Continue Tussionex per J-tube for cough suppression. DM-glucose control better, decreased the insulin  dosing yesterday.  Continue to monitor closely.   J Tube site inflamed, drainage persists, continue wound care Deconditioning-  continue PT/OT HEME- transfused with 1 unit PRBC's 5/6, Hgb stable at 9.4 .   A.m. labs pending.   LOS: 11 days   Seth G. Roddenberry, PA-C 09/30/2023   Agree Will check nutritional labs today, once pre-albumin  stable, will plan for discharge Setting up outpatient appointment for [facility]  Melvie Paglia [place]

## 2023-10-01 LAB — BASIC METABOLIC PANEL WITH GFR
Anion gap: 10 (ref 5–15)
BUN: 64 mg/dL — ABNORMAL HIGH (ref 8–23)
CO2: 24 mmol/L (ref 22–32)
Calcium: 8.1 mg/dL — ABNORMAL LOW (ref 8.9–10.3)
Chloride: 109 mmol/L (ref 98–111)
Creatinine, Ser: 1.89 mg/dL — ABNORMAL HIGH (ref 0.61–1.24)
GFR, Estimated: 39 mL/min — ABNORMAL LOW (ref >60)
Glucose, Bld: 101 mg/dL — ABNORMAL HIGH (ref 70–99)
Potassium: 4.4 mmol/L (ref 3.5–5.1)
Sodium: 143 mmol/L (ref 135–145)

## 2023-10-01 LAB — GLUCOSE, CAPILLARY
Glucose-Capillary: 62 mg/dL — ABNORMAL LOW (ref 70–99)
Glucose-Capillary: 56 mg/dL — ABNORMAL LOW (ref 70–99)
Glucose-Capillary: 117 mg/dL — ABNORMAL HIGH (ref 70–99)
Glucose-Capillary: 102 mg/dL — ABNORMAL HIGH (ref 70–99)
Glucose-Capillary: 136 mg/dL — ABNORMAL HIGH (ref 70–99)
Glucose-Capillary: 118 mg/dL — ABNORMAL HIGH (ref 70–99)

## 2023-10-01 LAB — CBC
HCT: 28.7 % — ABNORMAL LOW (ref 39.0–52.0)
Hemoglobin: 8.9 g/dL — ABNORMAL LOW (ref 13.0–17.0)
MCH: 28.4 pg (ref 26.0–34.0)
MCHC: 31 g/dL (ref 30.0–36.0)
MCV: 91.7 fL (ref 80.0–100.0)
Platelets: 363 10*3/uL (ref 150–400)
RBC: 3.13 MIL/uL — ABNORMAL LOW (ref 4.22–5.81)
RDW: 17.5 % — ABNORMAL HIGH (ref 11.5–15.5)
WBC: 11 10*3/uL — ABNORMAL HIGH (ref 4.0–10.5)
nRBC: 0 % (ref 0.0–0.2)

## 2023-10-01 LAB — EXPECTORATED SPUTUM ASSESSMENT W GRAM STAIN, RFLX TO RESP C

## 2023-10-01 MED ORDER — DEXTROSE 50 % IV SOLN
INTRAVENOUS | Status: AC
  Administered 2023-10-01: 25 mL
  Filled 2023-10-01: qty 50

## 2023-10-01 MED ORDER — SIMETHICONE 40 MG/0.6ML PO SUSP
80.0000 mg | Freq: Four times a day (QID) | ORAL | Status: DC
Start: 2023-10-01 — End: 2023-10-14
  Administered 2023-10-01 – 2023-10-14 (×47): 80 mg
  Filled 2023-10-01 (×57): qty 1.2

## 2023-10-01 MED ORDER — DEXTROSE 50 % IV SOLN
INTRAVENOUS | Status: AC
Start: 2023-10-01 — End: 2023-10-01
  Administered 2023-10-01: 50 mL
  Filled 2023-10-01: qty 50

## 2023-10-01 NOTE — Progress Notes (Signed)
 Hypoglycemic Event  CBG: 56  Treatment: D50 50 mL (25 gm)  Symptoms: None  Follow-up CBG Time: 0853 CBG Result: 117  Possible Reasons for Event: Other: tube feeding stopped overnight  Comments/MD notified: Myron Roddenberry, PA    Dakia Schifano, RN

## 2023-10-01 NOTE — Plan of Care (Signed)
  Problem: Pain Managment: Goal: General experience of comfort will improve and/or be controlled Outcome: Progressing   Problem: Safety: Goal: Ability to remain free from injury will improve Outcome: Progressing   Problem: Skin Integrity: Goal: Risk for impaired skin integrity will decrease Outcome: Progressing

## 2023-10-01 NOTE — Progress Notes (Signed)
 [address]       Seth Burch [address]             [phone]       Subjective:  Resting in bed. Said he had more abdominal discomfort with bloating last night. The TF was turned off by the RN.  He is more comfortable this morning. He said he has been having regular BM's that are loose most of the time. Also c/o sore dry mouth and throat.    Objective: Vital signs in last 24 hours: Temp:  [97.3 F (36.3 C)-98.3 F (36.8 C)] 97.6 F (36.4 C) (05/14 0421) Pulse Rate:  [89-107] 97 (05/14 0421) Cardiac Rhythm: Sinus tachycardia (05/13 1900) Resp:  [20-41] 20 (05/14 0421) BP: (92-116)/(64-74) 107/74 (05/14 0421) SpO2:  [91 %-96 %] 96 % (05/14 0421)  Intake/Output from previous day: 05/13 0701 - 05/14 0700 In: 1943.2 [P.O.:50; NG/GT:1263.2; IV Piggyback:400] Out: 350 [Urine:350]  General appearance: alert, cooperative, and no distress  Heart: regular rate and rhythm. SR with occasional ST. Lungs: normal work of breathing this morning, maintaining O2 sats on 4Lnc O2.  Abdomen: soft, nontender tender, non-distended.  The J-tube insertion site is dressed with bulky dressings. Extremities: extremities normal, atraumatic, no cyanosis or edema Wound: right chest tube site is clean and dry  Lab Results: Recent Labs    09/30/23 0458 10/01/23 0512  WBC 24.3* 11.0*  HGB 10.8* 8.9*  HCT 35.1* 28.7*  PLT 485* 363   BMET:  Recent Labs    09/30/23 1800 10/01/23 0512  NA 142 143  K 4.8 4.4  CL 110 109  CO2 22 24  GLUCOSE 80 101*  BUN 66* 64*  CREATININE 1.89* 1.89*  CALCIUM  8.0* 8.1*    PT/INR: No results for input(s): "LABPROT", "INR" in the last 72 hours. ABG    Component Value Date/Time   PHART 7.4 07/29/2023 0514   HCO3 25.8 08/02/2023 2006   TCO2 17 (L) 08/19/2023 2022   ACIDBASEDEF 1.0 08/02/2023 1506   O2SAT 94.5 08/02/2023 2006   CBG (last 3)  Recent Labs    09/30/23 2004 09/30/23 2346 10/01/23 0418  GLUCAP 77 103* 62*     Assessment/Plan:  S/P Esophagectomy, complicated by Anastomotic Leak S/p Stent Placement 4/2. EGD 5/5 with stent removal.  Inspection of esophageal lumen revealed persistent perforation. New esoph. stent placed. Remains NPO. 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  There was no chest tube output recorded past 3 days but there is white liquid in the tube.  Leave CT in place and monitor. Severe Protein Calorie Malnutrition-  on Johny Nap TF per RD recs  with water  boluses.  TF on hold last night due to abd discomfort due to bloating. Will resume at lower rat this morning and add scheduled simethicone . If he tolerates this, will ramp the rate back up to goal. Cough-  Dr. Deloise Ferries evaluated with bronch on 09/25/2023.  There was evidence of aspiration but no sign of a fistula. WBC 24,000 5/13, empiric Zosyn  / Vanc added.  WBC 11,000. CXR and UA showed no obvious infectious source.  Sputum cx pending. Continue ABX for now and monitor. DM-has tended to be hypoglycemic (TF on hold) Will d/c the q4h Novolog  for now.   Continue to monitor closely.   J Tube site inflamed, drainage persists, continue wound care Deconditioning-  continue PT/OT HEME- transfused with 1 unit PRBC's 5/6, Hgb stable at 98.9.    RENAL-acute  renal insufficiency.  Hopefully creat has reached plateau. K+ 5.8-4.8-4.4 after 1 dose Lokelma.     LOS: 12 days   Seth Montesinos G. Zulma Court, PA-C 10/01/2023

## 2023-10-01 NOTE — Inpatient Diabetes Management (Signed)
 Inpatient Diabetes Program Recommendations  AACE/ADA: New Consensus Statement on Inpatient Glycemic Control (2015)  Target Ranges:  Prepandial:   less than 140 mg/dL      Peak postprandial:   less than 180 mg/dL (1-2 hours)      Critically ill patients:  140 - 180 mg/dL   Lab Results  Component Value Date   GLUCAP 117 (H) 10/01/2023   HGBA1C 7.1 (H) 07/24/2023    Latest Reference Range & Units 09/30/23 08:08 09/30/23 11:39 09/30/23 16:35 09/30/23 20:04 09/30/23 23:46 10/01/23 04:18 10/01/23 08:12 10/01/23 08:53  Glucose-Capillary 70 - 99 mg/dL 130 (H) 865 (H) 86 77 784 (H) 62 (L) 56 (L) 117 (H)  (H): Data is abnormally high (L): Data is abnormally low   Diabetes history: DM2 Outpatient Diabetes medications: Lantus  14 units every day (not taking), Jardiance 10 mg every day (not taking) Current orders for Inpatient glycemic control: Semglee  18 units BID, Novolog  0-9 units TID and 0-5 units at bedtime  Inpatient Diabetes Program Recommendations:   Tube feeding stopped @ 1:15 am due to patient request (c/o abdominal cramps). Patient had received scheduled tube feed coverage @ 0029 am -patient had hypoglycemia.  Will follow while hospitalized.  Thank you, Tilford Deaton E. Adonay Scheier, RN, MSN, CDCES  Diabetes Coordinator Inpatient Glycemic Control Team Team Pager [phone] (8am-5pm) 10/01/2023 11:31 AM

## 2023-10-01 NOTE — Progress Notes (Signed)
 Nutrition Brief Note  Pt with complaints of abdominal cramping and feelings of fullness early this morning and with diarrhea, although not excessive in frequency. Simethicone  ordered by surgery with guidance to reduce TF rate and attempt to titrate back up to goal rate slowly thereafter.  Last check in on Friday, patient without endorsed complaints. He has continued with c/o of this sensation throughout multiple admissions with variety of formulas. Vivonex does not sufficiently meet his needs. If unable to advance back to goal rate, recommend TPN to adequately meet needs as he has been unable to meet his estimated calorie and protein needs for majority of his hospital admission and has regressed into severe protein calorie malnutrition.  Labs: Na+ 143 (wdl) K+ 4.4 (wdl) Crt 1.34>1.83>1.89>1.89 WBC 17.7>24.3>11.0 (H) CBGs 80-101 x24 hours A1c 7.1 (07/2023)  Acute renal insufficiency noted. Renal function has declined and potassium trended up. Of note, he is at risk for dehydration. This is likely, as he is experiencing losses via stool and J-tube site. Reduced gastric juices can contribute to intolerance of current tube feeding regimen and promote bowel irregularity and perpetuate the loss of fluids. Adequate fluid intake with TF + FWF of >2L.    Recommending TPN if unable to advance back to goal rate of [employer]. Also recommend increased free water  when tube feeds on hold as dehydration can worsen lab trends and renal function.  Wt Readings from Last 15 Encounters:  09/29/23 67.7 kg  09/19/23 73.9 kg  09/15/23 76.1 kg  08/19/23 77.4 kg  07/24/23 82.1 kg  07/18/23 83 kg  07/11/23 83 kg  07/03/23 80.2 kg  06/30/23 79.4 kg  06/12/23 82.6 kg  05/30/23 80.6 kg  05/22/23 81 kg  05/16/23 82.1 kg  05/13/23 80.9 kg  05/09/23 82.6 kg    INTERVENTION:  Continue TF via J-tube: [employer] 1.4 at 29ml/hr ( per day) 60ml ProSource TF20 once daily FWF 150ml q4 hours    TF at goal  rate provides: 2264 kcals, 117g protein and 2023ml per day (TF + FWF)   Recommend TPN if unable to increase back to goal rate of [employer] MVI with minerals daily  Continue nutrisource fiber BID to provide 3g soluble fiber per serving to aid in bulking of stool, if indicated Monitor for diet advancement and assess tolerance  NUTRITION DIAGNOSIS:  Severe Malnutrition related to chronic illness (esophageal cancer s/p chemo, radiation and surgery) as evidenced by severe muscle depletion, severe fat depletion, percent weight loss. - remains applicable   GOAL:  Patient will meet greater than or equal to 90% of their needs - meeting w/ tube feeding   MONITOR:  Diet advancement, TF tolerance, Weight trends, Labs, PO intake  Con Decant MS, RD, LDN Registered Dietitian Clinical Nutrition RD Inpatient Contact Info in Amion

## 2023-10-01 NOTE — Progress Notes (Signed)
 The patient was complaining of abdominal cramps, feeling full of gas, and had another diarrhea bowel movement. Stated he was having abdominal discomfort all evening.  When he asked what type the tube feeding was and was told it was [name], the patient asked for the tube feeding to be stopped.  He stated that he had had problems with this brand before.  Tube feeding has been stopped.  Will continue to monitor the patient.  Seth Lucky, RN

## 2023-10-01 NOTE — Progress Notes (Signed)
 Physical Therapy Treatment Patient Details Name: Seth Burch MRN: [id number] DOB: Sep 29, 1955 Today's Date: 10/01/2023   History of Present Illness 68 y.o. male presents to [facility] 09/19/23 after follow-up with Dr.Lightfoot with failure to thrive, low O2 sats/SOB and milky-output of chest tube. Pt with chylothorax, esophageal anastomotic leak, and J tube bilious drainage. EGD on 5/5 with stent removal and inspection of esophageal lumen revealed persistent perforation. New esoph. stent placed. Seth Burch Extensive prior admits 3/10-4/1 and 4/1-4/28. 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 Pt agreeable to bed level therex with all HEP exercises reviewed and modified leg presses with yellow theraband, supine marches and heel slides added as pt expressing tightness of knees with flexion. Continued education on importance of OOB mobility for cardiopulmomary compliance and strength maintenance with pt verbalizing understanding. Pt continues to benefit from skilled PT services to progress toward functional mobility goals.     If plan is discharge home, recommend the following: Assistance with cooking/housework;Assist for transportation;Help with stairs or ramp for entrance;A little help with walking and/or transfers;A little help with bathing/dressing/bathroom   Can travel by private vehicle        Equipment Recommendations  None recommended by PT    Recommendations for Other Services       Precautions / Restrictions Precautions Precautions: Fall Recall of Precautions/Restrictions: Intact Precaution/Restrictions Comments: L UQ jejunostomy, R chest tube Restrictions Weight Bearing Restrictions Per Provider Order: No     Mobility  Bed Mobility Overal bed mobility: Needs Assistance              General bed mobility comments: pt declining EOB    Transfers                   General transfer comment: declined standing/SPTs    Ambulation/Gait                   Stairs             Wheelchair Mobility     Tilt Bed    Modified Rankin (Stroke Patients Only)       Balance                                            Communication Communication Communication: No apparent difficulties Factors Affecting Communication: Difficulty expressing self  Cognition Arousal: Alert Behavior During Therapy: Anxious, Flat affect   PT - Cognitive impairments: No apparent impairments                         Following commands: Intact Following commands impaired: Follows one step commands inconsistently, Follows one step commands with increased time    Cueing Cueing Techniques: Verbal cues, Gestural cues, Tactile cues, Visual cues  Exercises General Exercises - Lower Extremity Ankle Circles/Pumps: AROM, Both, 10 reps, Supine Quad Sets: AROM, Right, Left, 10 reps, Supine Gluteal Sets: AROM, Both, 10 reps, Supine Short Arc Quad: AROM, Right, Left, 10 reps, Supine Heel Slides: AROM, Right, Left, 5 reps, Supine Hip Flexion/Marching: AROM, Both, Other reps (comment), Supine Other Exercises Other Exercises: supine bridge x5 Other Exercises: modified leg press with yellow Tband x10 ea side  General Comments        Pertinent Vitals/Pain Pain Assessment Pain Assessment: Faces Faces Pain Scale: Hurts little more Pain Location: lateral aspect of L thigh during sup-sit, subsided once sitting EOB Pain Descriptors / Indicators: Sharp, Shooting Pain Intervention(s): Monitored during session, Limited activity within patient's tolerance    Home Living                          Prior Function            PT Goals (current goals can now be found in the care plan section) Acute Rehab PT Goals Patient Stated  Goal: to go home PT Goal Formulation: With patient/family Time For Goal Achievement: 10/04/23 Progress towards PT goals: Progressing toward goals (fatigue)    Frequency    Min 3X/week      PT Plan      Co-evaluation              AM-PAC PT "6 Clicks" Mobility   Outcome Measure  Help needed turning from your back to your side while in a flat bed without using bedrails?: A Little Help needed moving from lying on your back to sitting on the side of a flat bed without using bedrails?: A Little Help needed moving to and from a bed to a chair (including a wheelchair)?: A Little Help needed standing up from a chair using your arms (e.g., wheelchair or bedside chair)?: A Lot Help needed to walk in hospital room?: Total Help needed climbing 3-5 steps with a railing? : Total 6 Click Score: 13    End of Session Equipment Utilized During Treatment: Oxygen Activity Tolerance: Patient limited by fatigue Patient left: in bed;with call bell/phone within reach Nurse Communication: Mobility status PT Visit Diagnosis: Other abnormalities of gait and mobility (R26.89);Unsteadiness on feet (R26.81);Muscle weakness (generalized) (M62.81)     Time: 1610-9604 PT Time Calculation (min) (ACUTE ONLY): 15 min  Charges:    $Therapeutic Exercise: 8-22 mins PT General Charges $$ ACUTE PT VISIT: 1 Visit                     Seth Burch R. PTA Acute Rehabilitation Services Office: [phone]   Seth Burch 10/01/2023, 4:16 PM

## 2023-10-02 ENCOUNTER — Inpatient Hospital Stay: Admitting: Oncology

## 2023-10-02 ENCOUNTER — Inpatient Hospital Stay: Attending: Oncology

## 2023-10-02 LAB — BASIC METABOLIC PANEL WITH GFR
Anion gap: 11 (ref 5–15)
BUN: 53 mg/dL — ABNORMAL HIGH (ref 8–23)
CO2: 23 mmol/L (ref 22–32)
Calcium: 8 mg/dL — ABNORMAL LOW (ref 8.9–10.3)
Chloride: 110 mmol/L (ref 98–111)
Creatinine, Ser: 1.88 mg/dL — ABNORMAL HIGH (ref 0.61–1.24)
GFR, Estimated: 39 mL/min — ABNORMAL LOW (ref >60)
Glucose, Bld: 136 mg/dL — ABNORMAL HIGH (ref 70–99)
Potassium: 4.1 mmol/L (ref 3.5–5.1)
Sodium: 144 mmol/L (ref 135–145)

## 2023-10-02 LAB — CBC
HCT: 28.4 % — ABNORMAL LOW (ref 39.0–52.0)
Hemoglobin: 8.6 g/dL — ABNORMAL LOW (ref 13.0–17.0)
MCH: 27.8 pg (ref 26.0–34.0)
MCHC: 30.3 g/dL (ref 30.0–36.0)
MCV: 91.9 fL (ref 80.0–100.0)
Platelets: 357 10*3/uL (ref 150–400)
RBC: 3.09 MIL/uL — ABNORMAL LOW (ref 4.22–5.81)
RDW: 17.6 % — ABNORMAL HIGH (ref 11.5–15.5)
WBC: 9.5 10*3/uL (ref 4.0–10.5)
nRBC: 0 % (ref 0.0–0.2)

## 2023-10-02 LAB — GLUCOSE, CAPILLARY
Glucose-Capillary: 120 mg/dL — ABNORMAL HIGH (ref 70–99)
Glucose-Capillary: 120 mg/dL — ABNORMAL HIGH (ref 70–99)
Glucose-Capillary: 126 mg/dL — ABNORMAL HIGH (ref 70–99)
Glucose-Capillary: 130 mg/dL — ABNORMAL HIGH (ref 70–99)
Glucose-Capillary: 135 mg/dL — ABNORMAL HIGH (ref 70–99)
Glucose-Capillary: 148 mg/dL — ABNORMAL HIGH (ref 70–99)
Glucose-Capillary: 93 mg/dL (ref 70–99)

## 2023-10-02 LAB — VANCOMYCIN, RANDOM: Vancomycin Rm: 10 ug/mL

## 2023-10-02 MED ORDER — VANCOMYCIN HCL 1500 MG/300ML IV SOLN
1500.0000 mg | INTRAVENOUS | Status: DC
  Administered 2023-10-02 – 2023-10-04 (×2): 1500 mg via INTRAVENOUS
  Filled 2023-10-02 (×2): qty 300

## 2023-10-02 NOTE — Progress Notes (Signed)
 Occupational Therapy Treatment Patient Details Name: Seth Burch MRN: [id number] DOB: [birth year] Today's Date: 10/02/2023   History of present illness 68 y.o. male presents to [facility] 09/19/23 after follow-up with Dr.Lightfoot with failure to thrive, low O2 sats/SOB and milky-output of chest tube. Pt with chylothorax, esophageal anastomotic leak, and J tube bilious drainage. EGD on 5/5 with stent removal and inspection of esophageal lumen revealed persistent perforation. New esoph. stent placed. [name] Extensive prior admits 3/10-4/1 and 4/1-4/28. 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 Reports he is routinely getting OOB to [facility] due to diarrhea and had already completed ADLs with nursing staff. Provided level 3 theraband as pt reports level 1 is not challenging and instructed in UE exercises he can complete between therapy sessions in addition to those PT has instructed. Intends to go home if not transferred to [facility]. Updated d/c recommendation to home health.      If plan is discharge home, recommend the following:  A little help with walking and/or transfers;A lot of help with bathing/dressing/bathroom;Assistance with cooking/housework;Direct supervision/assist for medications management;Direct supervision/assist for financial management;Assist for transportation;Help with stairs or ramp for entrance;Supervision due to cognitive status   Equipment Recommendations  None recommended by OT    Recommendations for Other Services      Precautions / Restrictions Precautions Precautions: Fall Recall of Precautions/Restrictions: Intact Precaution/Restrictions Comments: L UQ jejunostomy, R chest tube Restrictions Weight Bearing Restrictions Per Provider Order: No       Mobility Bed Mobility               General bed mobility comments: pt  declining EOB    Transfers                   General transfer comment: declining OOB     Balance                                           ADL either performed or assessed with clinical judgement   ADL                                         General ADL Comments: declined ADL training, reports, "they already washed me up and changed my bed today."    Extremity/Trunk Assessment              Vision       Perception     Praxis     Communication Communication Communication: No apparent difficulties   Cognition Arousal: Alert Behavior During Therapy: Flat affect Cognition: Cognition impaired     Awareness: Online awareness impaired, Intellectual awareness impaired       OT - Cognition Comments: Pt with decreased awareness of benefits of increaseing activity level throughout the day, remains fairly dependent on nursing staff for ADLs.                 Following commands: Intact        Cueing   Cueing Techniques: Verbal cues, Gestural cues  Exercises Exercises: General Upper Extremity General Exercises - Upper Extremity Shoulder Flexion: Strengthening, Both, 5 reps, Supine, Theraband Theraband Level (Shoulder Flexion): Level 3 (Green)  Shoulder Extension: Strengthening, Both, 5 reps, Supine, Theraband Theraband Level (Shoulder Extension): Level 3 (Green) Shoulder Horizontal ABduction: Strengthening, Both, 5 reps, Theraband Theraband Level (Shoulder Horizontal Abduction): Level 3 (Green)    Shoulder Instructions       General Comments      Pertinent Vitals/ Pain       Pain Assessment Pain Assessment: No/denies pain  Home Living                                          Prior Functioning/Environment              Frequency  Min 2X/week        Progress Toward Goals  OT Goals(current goals can now be found in the care plan section)  Progress towards OT goals: Not  progressing toward goals - comment  Acute Rehab OT Goals OT Goal Formulation: With patient Time For Goal Achievement: 10/04/23 Potential to Achieve Goals: Fair  Plan      Co-evaluation                 AM-PAC OT "6 Clicks" Daily Activity     Outcome Measure   Help from another person eating meals?: Total Help from another person taking care of personal grooming?: A Little Help from another person toileting, which includes using toliet, bedpan, or urinal?: A Lot Help from another person bathing (including washing, rinsing, drying)?: A Lot Help from another person to put on and taking off regular upper body clothing?: A Little Help from another person to put on and taking off regular lower body clothing?: A Lot 6 Click Score: 13    End of Session    OT Visit Diagnosis: Muscle weakness (generalized) (M62.81)   Activity Tolerance Other (comment) (self limiting)   Patient Left in bed;with call bell/phone within reach;with bed alarm set;with family/visitor present   Nurse Communication          Time: 4098-1191 OT Time Calculation (min): 15 min  Charges: OT General Charges $OT Visit: 1 Visit OT Treatments $Therapeutic Exercise: 8-22 mins  Avanell Leigh, OTR/L Acute Rehabilitation Services Office: [phone]   Jonette Nestle 10/02/2023, 12:18 PM

## 2023-10-02 NOTE — Progress Notes (Signed)
 Pharmacy Antibiotic Note  Seth Burch is a 68 y.o. male admitted on 09/19/2023 s/p esophagectomy with anastomotic leak with evidence of aspiration and a cough.  Pharmacy has been consulted for Vancomycin  dosing. Patient placed on Vancomycin  and Zosyn  for coverage. Patient received 1500mg  IV x 1 on 5/13 and placed on variable dosing due to an increase in Scr from 1.25>>1.34>>1.89. Urine o/p <500 ml. Random vancomycin  level ~48 hours post dose is 10 mcg/dl. Scr appears to have stabilized at higher level. Will place patient on scheduled dose. Expectorated sputum growing GPC in clusters, but MRSA PCR negative making MRSA PNA unlikely.   Plan: Vancomycin  1500 mg IV q48 hours starting now (estimated AUC 410, Scr 1.88 used) Would recommend change to cefepime  and flagyl  from zosyn  due to high risk of exacerbating current AKI.  Will f/u dosing with levels and renal function changes F/u sputum cultures F/u de-escalation since MRSA PCR negative  Height: 5\' 10"  (177.8 cm) Weight: 67.7 kg (149 lb 4 oz) IBW/kg (Calculated) : 73  Temp (24hrs), Avg:97.4 F (36.3 C), Min:95.8 F (35.4 C), Max:98.6 F (37 C)  Recent Labs  Lab 09/26/23 0500 09/28/23 1017 09/30/23 0458 09/30/23 1130 09/30/23 1800 10/01/23 0512 10/02/23 0547 10/02/23 1033  WBC 14.0* 17.7* 24.3*  --   --  11.0* 9.5  --   CREATININE 1.41* 1.25* 1.34* 1.83* 1.89* 1.89* 1.88*  --   VANCORANDOM  --   --   --   --   --   --   --  10    Estimated Creatinine Clearance: 37 mL/min (A) (by C-G formula based on SCr of 1.88 mg/dL (H)).    No Known Allergies  Antimicrobials this admission: 5/13 Vancomycin  >>  5/13 Zosyn  >>   Dose adjustments this admission:   Microbiology results:  5/14 Sputum: GPC in clusters  5/5 MRSA PCR: negative  Bethanny Toelle A. Brynn Caras, PharmD, BCPS, FNKF Clinical Pharmacist [place] Please utilize Amion for appropriate phone number to reach the unit pharmacist [facility])  10/02/2023 11:06 AM

## 2023-10-02 NOTE — Progress Notes (Addendum)
      [address]       Seth Burch [id number]             [phone]       Subjective:  Resting in bed. TF infusing at 45ml/hr.  C/O multiple loose stools and incontinence.     Objective: Vital signs in last 24 hours: Temp:  [95.8 F (35.4 C)-97.9 F (36.6 C)] 97.5 F (36.4 C) (05/15 0414) Pulse Rate:  [78-92] 92 (05/15 0414) Cardiac Rhythm: Normal sinus rhythm (05/14 1900) Resp:  [18-24] 18 (05/15 0414) BP: (100-129)/(68-77) 118/70 (05/15 0414) SpO2:  [90 %-98 %] 95 % (05/15 0414)  Intake/Output from previous day: 05/14 0701 - 05/15 0700 In: 160 [I.V.:10; IV Piggyback:150] Out: 475 [Urine:475]  General appearance: alert, cooperative, and no distress  Heart: regular rate and rhythm. SR on monitor Lungs: normal work of breathing this morning, maintaining O2 sats on 4Lnc O2.  Abdomen: soft, nontender tender, non-distended.  The J-tube insertion stable with surrounding inflammation and induration. Getting regular wound care as ordered.  Extremities: extremities normal, atraumatic, no cyanosis or edema Wound: right chest tube site is clean and dry  Lab Results: Recent Labs    10/01/23 0512 10/02/23 0547  WBC 11.0* 9.5  HGB 8.9* 8.6*  HCT 28.7* 28.4*  PLT 363 357   BMET:  Recent Labs    10/01/23 0512 10/02/23 0547  NA 143 144  K 4.4 4.1  CL 109 110  CO2 24 23  GLUCOSE 101* 136*  BUN 64* 53*  CREATININE 1.89* 1.88*  CALCIUM  8.1* 8.0*    PT/INR: No results for input(s): "LABPROT", "INR" in the last 72 hours. ABG    Component Value Date/Time   PHART 7.4 07/29/2023 0514   HCO3 25.8 08/02/2023 2006   TCO2 17 (L) 08/19/2023 2022   ACIDBASEDEF 1.0 08/02/2023 1506   O2SAT 94.5 08/02/2023 2006   CBG (last 3)  Recent Labs    10/01/23 1934 10/02/23 0049 10/02/23 0421  GLUCAP 118* 93 130*    Assessment/Plan:  S/P Esophagectomy, complicated by Anastomotic Leak S/p Stent Placement 4/2. EGD 5/5 with stent removal.  Inspection of  esophageal lumen revealed persistent perforation. New esoph. stent placed. Remains NPO. 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  There was no chest tube output recorded past 3 days but there is white liquid in the tube.  Leave CT in place and monitor. Severe Protein Calorie Malnutrition-  on Johny Nap TF per RD recs  with water  boluses.  TF at 41ml/hr (goal 40ml/hr.  Increase the rate this morning.  Cough-  Dr. Deloise Ferries evaluated with bronch on 09/25/2023.  There was evidence of aspiration but no sign of a fistula. WBC 24,000 5/13, empiric Zosyn  / Vanc added.  WBC has normalized. CXR and UA showed no obvious infectious source.  Sputum cx still pending. Continue ABX for now and monitor. DM-better control without hypoglycemia over night.  Continue current insulin  dosing and  monitor closely.   J Tube site inflamed, drainage persists, continue wound care Deconditioning-  continue PT/OT HEME- transfused with 1 unit PRBC's 5/6, Hct stable at 28 RENAL-acute renal insufficiency.  Hopefully creat has reached plateau. Monitoring.    LOS: 13 days   Seth G. Roddenberry, PA-C 10/02/2023   Agree PT/OT Continue nutrition support  Seth Burch Seth Burch

## 2023-10-03 ENCOUNTER — Other Ambulatory Visit: Payer: Self-pay

## 2023-10-03 ENCOUNTER — Inpatient Hospital Stay (HOSPITAL_COMMUNITY)

## 2023-10-03 LAB — GLUCOSE, CAPILLARY
Glucose-Capillary: 141 mg/dL — ABNORMAL HIGH (ref 70–99)
Glucose-Capillary: 121 mg/dL — ABNORMAL HIGH (ref 70–99)
Glucose-Capillary: 175 mg/dL — ABNORMAL HIGH (ref 70–99)
Glucose-Capillary: 74 mg/dL (ref 70–99)
Glucose-Capillary: 113 mg/dL — ABNORMAL HIGH (ref 70–99)

## 2023-10-03 LAB — BASIC METABOLIC PANEL WITH GFR
Anion gap: 7 (ref 5–15)
BUN: 48 mg/dL — ABNORMAL HIGH (ref 8–23)
CO2: 24 mmol/L (ref 22–32)
Calcium: 8.1 mg/dL — ABNORMAL LOW (ref 8.9–10.3)
Chloride: 114 mmol/L — ABNORMAL HIGH (ref 98–111)
Creatinine, Ser: 1.94 mg/dL — ABNORMAL HIGH (ref 0.61–1.24)
GFR, Estimated: 37 mL/min — ABNORMAL LOW (ref >60)
Glucose, Bld: 130 mg/dL — ABNORMAL HIGH (ref 70–99)
Potassium: 4 mmol/L (ref 3.5–5.1)
Sodium: 145 mmol/L (ref 135–145)

## 2023-10-03 LAB — PHOSPHORUS: Phosphorus: 3.9 mg/dL (ref 2.5–4.6)

## 2023-10-03 LAB — MAGNESIUM: Magnesium: 2.4 mg/dL (ref 1.7–2.4)

## 2023-10-03 MED ORDER — SODIUM CHLORIDE 0.9 % IV SOLN
2.0000 g | Freq: Two times a day (BID) | INTRAVENOUS | Status: DC
  Administered 2023-10-03 – 2023-10-05 (×5): 2 g via INTRAVENOUS
  Filled 2023-10-03 (×5): qty 12.5

## 2023-10-03 MED ORDER — SODIUM CHLORIDE 0.9% FLUSH: 10.0000 mL | INTRAVENOUS | Status: DC | PRN

## 2023-10-03 MED ORDER — TRAVASOL 10 % IV SOLN
INTRAVENOUS | Status: AC
  Filled 2023-10-03: qty 604.8

## 2023-10-03 MED ORDER — SODIUM CHLORIDE 0.9% FLUSH
10.0000 mL | Freq: Two times a day (BID) | INTRAVENOUS | Status: DC
  Administered 2023-10-03: 20 mL
  Administered 2023-10-04 – 2023-10-10 (×13): 10 mL
  Administered 2023-10-10: 20 mL
  Administered 2023-10-11 – 2023-10-14 (×6): 10 mL

## 2023-10-03 MED ORDER — KATE FARMS PEPTIDE 1.5 EN LIQD
25.0000 mL/h | ENTERAL | Status: DC
  Administered 2023-10-03: 25 mL/h via JEJUNOSTOMY
  Filled 2023-10-03 (×3): qty 1000

## 2023-10-03 MED ORDER — METRONIDAZOLE 500 MG/100ML IV SOLN
500.0000 mg | Freq: Two times a day (BID) | INTRAVENOUS | Status: DC
  Administered 2023-10-03 – 2023-10-05 (×5): 500 mg via INTRAVENOUS
  Filled 2023-10-03 (×5): qty 100

## 2023-10-03 MED ORDER — DEXTROSE 70 % IV SOLN: INTRAVENOUS | Status: DC

## 2023-10-03 NOTE — Progress Notes (Signed)
 PHARMACY - TOTAL PARENTERAL NUTRITION CONSULT NOTE   Indication: intolerance of TF   Patient Measurements: Height: 5\' 10"  (177.8 cm) Weight: 67.7 kg (149 lb 4 oz) IBW/kg (Calculated) : 73 TPN AdjBW (KG): 73.9 Body mass index is 21.42 kg/m. 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 He then had a R VATS with decortication, drainage and esophageal stent 08/20/23. He had multiple J tube exchanges that decreased leakage. 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 He was discharged 4/28 but then readmitted 5/2 with failure to thrive, increased J tube leakage, poor po intake, and milky output from chest tube. Johny Nap TF was restarted 5/9 and was tolerating but started developing abdominal pressure/cramping and loose stools around 5/12. TF was turned off 5/13 PM per patient request. During this time chest tube output continued to be milky white but slowed. 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 Per patient, abdomen feels "terrible" with a lot of bloating and pressure. Patient having a lot of flatus and loose stools with a light tan color close to the color of his tube feeds. Pharmacy consulted for TPN to supplement TF. Per IV team, ok to use port for TPN.  Glucose/Insulin : BG < 150, A1C 7.1, used 2 units SSI/24hr and glargine 36 units Electrolytes: Na 145, CoCa 9.7, mag and phos added on and not received prior to TPN order due time  Renal: Scr 1.94 (BL 1.3), BUN 48 Hepatic: alk phos 177, others wnl, albumin  2 Intake / Output; MIVF: TF in; UOP 600 ml charted, LBM 5/15 x5 GI Imaging: 5/9 KUB: nonobstructive bowel gas pattern GI Surgeries / Procedures:  5/5 EGD stent removal, inspect  lumen, new stent placed   Central access: ok to use Port per IV team  TPN start date: 5/16   Nutritional Goals: Goal TPN rate is 45 mL/hr (provides 60 g of protein and 1204 kcals per day)  RD Assessment (TPN + TF): Total Estimated Needs Total Energy Estimated Needs: 2200-2400 kcal Total Protein Estimated Needs: 115-130 gm Total Fluid Estimated Needs: >2L/day  Per RD 5/16, TPN recommendations:  60g protein  1200 kcals   Current Nutrition:  TPN and Tube feeding 5/16 TF at goal 51ml/hr but planning to change to [facility] 1.5 cal/ml at lower rate   Plan:  Start TPN at goal 45 mL/hr at 1800, meeting 100% of TPN goals  Electrolytes in TPN: Na 0 mEq/L, K 20 mEq/L, Ca 0 mEq/L, Mg 0 mEq/L, and Phos 0 mmol/L. Cl:Ac 1:1 No standard MVI and trace elements to TPN since receiving per tube and in TF  Continue Sensitive q4h SSI  Stop glargine 18 units BID, ok per team  Add 20 units insulin  to TPN, titrate per response  Monitor TPN labs on Mon/Thurs, and PRN   Dorene Gang, PharmD, BCPS, BCCP Clinical Pharmacist  Please check AMION for all [facility] phone numbers After 10:00 PM, call Main Pharmacy [phone]

## 2023-10-03 NOTE — Progress Notes (Addendum)
 Nutrition Follow-up  DOCUMENTATION CODES:  Severe malnutrition in context of chronic illness  INTERVENTION:  Mofify TF via J-tube: [employer] 1.5 Peptide at 48ml/hr ( per day) Prosource TF20 60 ml daily FWF q4 hours    TF at goal rate provides: 1002 kcals (46% of estimated minimum needs), 64g protein (56% of minimum estimated needs) and per day (TF + FWF)   Initiate supplemental TPN to meet rest of estimated needs To be managed by pharmacy  TF + TPN at goal rate provide: 2206kcals and 124g protein  Continue MVI with minerals daily  Continue nutrisource fiber BID to provide 3g soluble fiber per serving to aid in bulking of stool, if indicated Monitor for diet advancement and assess tolerance New weight collection to assess trend   NUTRITION DIAGNOSIS:  Severe Malnutrition related to chronic illness (esophageal cancer s/p chemo, radiation and surgery) as evidenced by severe muscle depletion, severe fat depletion, percent weight loss. - remains applicable  GOAL:  Patient will meet greater than or equal to 90% of their needs - to be met via TF and TPN  MONITOR:  Diet advancement, TF tolerance, Weight trends, Labs, PO intake  REASON FOR ASSESSMENT:  Consult Enteral/tube feeding initiation and management  ASSESSMENT:  68 y.o Male with multiple readmissions due to complications after esophagectomy and J tube placement on 3/10 including, anastomosis leaks, dysphagia, chylothorax, FTT. Now presents with persistent coughing episodes, SOB, J tube drainage, esophageal anastomotic leak. PMH of DM, HTN, dyslipedemia, CKD, esophageal cancer, IgG kappa MGUS.  Previous Admission 07/28/2023-08/19/2023 3/10 EGD, XI Robotic Assisted Ivor Lewis Esophagectomy, J-tube placement 3/11 Trickle TF initiated 3/12 Surgery increased TF to 40 ml/hr 3/13 Surgery increased TF to 55 ml/hr, transferred to ICU  3/14 Surgery increased TF to 65 ml/hr (goal), bowel regimen added and +BM 3/15  Intubated, Bronch (Thick mucus in trachea suctioned, partially occluding L mainstem, ?aspiration, recurrent false vocal cord irritation with swelling leading to stridor and resp failure) 3/17 Extubated 3/18 Esophogram:  Brisk tracheal aspiration of ingested contrast, into the LEFT mainstem bronchus. SLP eval recommended. Stagnant contrast at diaphragmatic hiatus. No anastomotic leak.  3/19 Diet advanced to Dysphagia 2/Thins, later downgraded to Nectar Thick Liquids 3/20 Transition to Nocturnal TF, family to bring protein shake in from home 3/21 Significant diarrhea overnight, (9 stools in 10 hours), significant J-tube drainage, change to Vivonex with day time feedings 3/24 Diet changed to Dysphagia 2, Thins per SLP 3/26 NPO due to concern for possible esophageal leak 3/27 advanced to Dysphagia 3, thin liquids  3/31 modify TF regimen for d/c 4/1 discharged home Previous Admission 08/19/2023-09/15/2023 4/2 admitted; OR: R VATS, EGD, and esophageal stent placement 4/3 trial Johny Nap TF formula during the day (16 hours), transfusion 4/4 advanced to goal rate; leaking J-tube, TF held  4/5 txr to cardiovascular ICU 4/7 TF re-initiated, transfused  4/9 J-tube replaced by IR 4/10 significant leaking from J-tube; TFs on hold  4/11 PICC placed, TPN initiated, J-tube replaced again, WOUND VAC 4/12 re-initiate trickles s/p second J-tube replacement 4/13 PICC line replaced 4/14 slowly advance TF goal rate 4/16 TF to goal rate; TPN discontinued 4/17 chylothorax: transition to Vivonex and very low fat diet 4/19 TF reduced due to feelings of fullness/diarrhea 4/21 TF rate increased to 81ml/hr 4/24 embolization of chyle leak 4/25 TF increased to 31ml/hr 4/28 discharged home Current Admission 5/2 Admitted Viovonex started @ 65 ml/hr, J tube drainage and chylothorax persists 5/5 EGD w/ esophageal stent replacement 5/6 transfused 5/8  bronchoscopy: no acute findings; evidence of aspiration 5/9  transitioned to [facility] 1.4 5/15 5 loose stools x24 hours 5/16 transition to [employer] 1.5 Peptide and supplemental TPN   Chylothorax resolving. Low CT output recorded x24 hours. Continues to cough up sputum. Green in color today.   He is not tolerating higher rates of [employer] 1.4 and was documented with 5 loose stools in last 24 hours suggesting malabsorption. Unsure if this is r/t intolerance or loss of gastric juices from J-tube site.    Spoke with surgery via secure chat who is amicable to transition to [employer] 1.5 Peptides at very low rate and initiation of supplemental TPN to meet needs while tolerance is assessed and TF slowly titrated up. Will assess tolerance Monday and begin to slowly titrate up. Discussed plan with patient and his wife at bedside who are amicable to this.  Admit Weight: 73.9kg Current Weight: 67.7kg   Recommend new weight collection to assess recent trend. No edema on exam.   Intake/Output Summary (Last 24 hours) at 10/03/2023 1225 Last data filed at 10/03/2023 1208 Gross per 24 hour  Intake 528.66 ml  Output 780 ml  Net -251.34 ml    Net IO Since Admission: 7,277.58 mL [10/03/23 1225]   Drains/Lines: LUQ: Jejunostomy (20 Fr) R Lateral Pleural (19Fr): 30ml x24 hours UOP: + 2 unmeasured occurrence  x24 hours   Will require insulin  adjustment with transition of TF and reduction of rate. Currently receiving 245g of CHO with [employer] 1.4 at goal rate. Will be receiving 83g CHO with transition to [facility] 1.5 peptide at lower rate.     Meds: famotidine , folic acid ,  SSI 4 units + 0-9 q4 hours, Semglee  18 BID,  lactulose, MVI, thiamine , IV ABX  Labs:  Na+ 145 (wdl) K+ 4.0 (wdl) CBGs 130-136 x24 hours A1c 7.1 (07/2023)  Diet Order:   Diet Order             Diet NPO time specified  Diet effective now            EDUCATION NEEDS:  Education needs have been addressed  Skin:  Skin Assessment: Skin Integrity Issues: Incisions: R chest  tube Other: MASD around J-tube  Last BM:  5/16 - type 6/7 x6  Height:  Ht Readings from Last 1 Encounters:  09/25/23 5\' 10"  (1.778 m)   Weight:  Wt Readings from Last 1 Encounters:  09/29/23 67.7 kg   Ideal Body Weight:  75.5 kg  BMI:  Body mass index is 21.42 kg/m.  Estimated Nutritional Needs:   Kcal:  2200-2400 kcal  Protein:  115-130 gm  Fluid:  >2L/day  Con Decant MS, RD, LDN Registered Dietitian Clinical Nutrition RD Inpatient Contact Info in Amion

## 2023-10-03 NOTE — Progress Notes (Signed)
 Peripherally Inserted Central Catheter Placement  The IV Nurse has discussed with the patient and/or persons authorized to consent for the patient, the purpose of this procedure and the potential benefits and risks involved with this procedure.  The benefits include less needle sticks, lab draws from the catheter, and the patient may be discharged home with the catheter. Risks include, but not limited to, infection, bleeding, blood clot (thrombus formation), and puncture of an artery; nerve damage and irregular heartbeat and possibility to perform a PICC exchange if needed/ordered by physician.  Alternatives to this procedure were also discussed.  Bard Power PICC patient education guide, fact sheet on infection prevention and patient information card has been provided to patient /or left at bedside.    PICC Placement Documentation  PICC Double Lumen 10/03/23 Left Basilic 42 cm 0 cm (Active)  Indication for Insertion or Continuance of Line Administration of hyperosmolar/irritating solutions (i.e. TPN, Vancomycin , etc.) 10/03/23 1647  Exposed Catheter (cm) 0 cm 10/03/23 1647  Site Assessment Clean, Dry, Intact 10/03/23 1647  Lumen #1 Status Saline locked;Blood return noted 10/03/23 1647  Lumen #2 Status Saline locked;Blood return noted 10/03/23 1647  Dressing Type Transparent;Securing device 10/03/23 1647  Dressing Status Antimicrobial disc/dressing in place 10/03/23 1647  Line Care Connections checked and tightened 10/03/23 1647  Line Adjustment (NICU/IV Team Only) No 10/03/23 1647  Dressing Intervention New dressing 10/03/23 1647  Dressing Change Due 10/10/23 10/03/23 1647       Lulu Sales Chenice 10/03/2023, 4:50 PM

## 2023-10-03 NOTE — Progress Notes (Addendum)
 [address]       [place] [address]             [phone]       Subjective:  Resting in bed. TF infusing at 38ml/hr.  Multiple loose stools (5 past 24 hours) with incontinence.    Objective: Vital signs in last 24 hours: Temp:  [97.6 F (36.4 C)-97.8 F (36.6 C)] 97.6 F (36.4 C) (05/16 0759) Pulse Rate:  [73-100] 100 (05/16 0759) Cardiac Rhythm: Normal sinus rhythm (05/15 1957) Resp:  [18-20] 18 (05/16 0759) BP: (107-116)/(71-76) 116/73 (05/16 0759) SpO2:  [96 %-100 %] 96 % (05/16 0759)  Intake/Output from previous day: 05/15 0701 - 05/16 0700 In: 528.7 [NG/GT:528.7] Out: 630 [Urine:600; Chest Tube:30]  General appearance: alert, cooperative, and no distress  Heart: regular rate and rhythm. SR on monitor Lungs: normal work of breathing, maintaining O2 sats on RA currently.  Abdomen: soft, nontender tender, non-distended.  The J-tube insertion stable with surrounding inflammation and induration. Getting regular wound care as ordered.  Extremities: extremities normal, atraumatic, no cyanosis or edema Wound: right chest tube site is clean and dry  Lab Results: Recent Labs    10/01/23 0512 10/02/23 0547  WBC 11.0* 9.5  HGB 8.9* 8.6*  HCT 28.7* 28.4*  PLT 363 357   BMET:  Recent Labs    10/01/23 0512 10/02/23 0547  NA 143 144  K 4.4 4.1  CL 109 110  CO2 24 23  GLUCOSE 101* 136*  BUN 64* 53*  CREATININE 1.89* 1.88*  CALCIUM  8.1* 8.0*    PT/INR: No results for input(s): "LABPROT", "INR" in the last 72 hours. ABG    Component Value Date/Time   PHART 7.4 07/29/2023 0514   HCO3 25.8 08/02/2023 2006   TCO2 17 (L) 08/19/2023 2022   ACIDBASEDEF 1.0 08/02/2023 1506   O2SAT 94.5 08/02/2023 2006   CBG (last 3)  Recent Labs    10/02/23 2340 10/03/23 0427 10/03/23 0801  GLUCAP 120* 141* 121*    Assessment/Plan:  S/P Esophagectomy, complicated by Anastomotic Leak S/p Stent Placement 4/2. EGD 5/5 with stent removal.   Inspection of esophageal lumen revealed persistent perforation. New esoph. stent placed. Remains NPO. 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  ~44ml white fluid from the right chest tube past 24 hours.   Leave CT in place and monitor. Severe Protein Calorie Malnutrition-  on Johny Nap TF per RD recs  with water  boluses.  TF at goal 23ml/hr  Cough-  Dr. Deloise Ferries evaluated with bronch on 09/25/2023.  There was evidence of aspiration but no sign of a fistula. WBC 24,000 5/13, empiric Zosyn  / Vanc added.  WBC has normalized. CXR and UA showed no obvious infectious source.  Sputum cx re-incubated for better growth. Continue ABX for now and monitor. DM-good control without hypoglycemia over past 24 hours.  Continue current insulin  dosing and  monitor closely.   J Tube site inflamed, drainage persists, continue wound care Deconditioning-  continue PT/OT HEME- transfused with 1 unit PRBC's 5/6, Repeat lab in am. RENAL-acute renal insufficiency.  Hopefully creat will begin to normalize. Lab pending.      LOS: 14 days   Seth G. Roddenberry, PA-C 10/03/2023   Addendum 11 AM: Discussed frequent loose stools and potential malabsorption of the tube feeding with Ms. Seth Burch, RD who recommended transition to TPN with gradual reintroduction of the tube feeding in order to optimize nutrition.  Discussed with Dr.  Ryna Burch.  Agree with this plan.  Consult placed to pharmacy to initiate and manage TPN.  Follow-up lab this morning showed creatinine gradually increasing, now 1.9.  Will stop the Zosyn  and replace with cefepime  and Flagyl  per pharmacy recommendations.   Assistance by RD and pharmacist appreciated.   Seth Bonier, PA-C  Agree Will add TPN  Seth Burch

## 2023-10-03 NOTE — Progress Notes (Signed)
 PT Cancellation Note  Patient Details Name: NILAY NIKIRK MRN: [id number] DOB: 02-Jun-1955   Cancelled Treatment:    Reason Eval/Treat Not Completed: (P) Patient declined, no reason specified, pt declining all mobility, encouraged pt to complete exercises throughout day as tolerated. Will check back as schedule allows to continue with PT POC.  Beverly Buckler. PTA Acute Rehabilitation Services Office: [phone]    [name] 10/03/2023, 11:32 AM

## 2023-10-04 LAB — CULTURE, RESPIRATORY W GRAM STAIN

## 2023-10-04 LAB — RENAL FUNCTION PANEL
Albumin: 2 g/dL — ABNORMAL LOW (ref 3.5–5.0)
Anion gap: 10 (ref 5–15)
BUN: 51 mg/dL — ABNORMAL HIGH (ref 8–23)
CO2: 22 mmol/L (ref 22–32)
Calcium: 8.3 mg/dL — ABNORMAL LOW (ref 8.9–10.3)
Chloride: 115 mmol/L — ABNORMAL HIGH (ref 98–111)
Creatinine, Ser: 1.86 mg/dL — ABNORMAL HIGH (ref 0.61–1.24)
GFR, Estimated: 39 mL/min — ABNORMAL LOW (ref >60)
Glucose, Bld: 120 mg/dL — ABNORMAL HIGH (ref 70–99)
Phosphorus: 3.6 mg/dL (ref 2.5–4.6)
Potassium: 4.2 mmol/L (ref 3.5–5.1)
Sodium: 147 mmol/L — ABNORMAL HIGH (ref 135–145)

## 2023-10-04 LAB — CBC
HCT: 29.2 % — ABNORMAL LOW (ref 39.0–52.0)
Hemoglobin: 8.9 g/dL — ABNORMAL LOW (ref 13.0–17.0)
MCH: 27.8 pg (ref 26.0–34.0)
MCHC: 30.5 g/dL (ref 30.0–36.0)
MCV: 91.3 fL (ref 80.0–100.0)
Platelets: 387 10*3/uL (ref 150–400)
RBC: 3.2 MIL/uL — ABNORMAL LOW (ref 4.22–5.81)
RDW: 17.3 % — ABNORMAL HIGH (ref 11.5–15.5)
WBC: 11.7 10*3/uL — ABNORMAL HIGH (ref 4.0–10.5)
nRBC: 0 % (ref 0.0–0.2)

## 2023-10-04 LAB — GLUCOSE, CAPILLARY
Glucose-Capillary: 103 mg/dL — ABNORMAL HIGH (ref 70–99)
Glucose-Capillary: 104 mg/dL — ABNORMAL HIGH (ref 70–99)
Glucose-Capillary: 114 mg/dL — ABNORMAL HIGH (ref 70–99)
Glucose-Capillary: 126 mg/dL — ABNORMAL HIGH (ref 70–99)
Glucose-Capillary: 134 mg/dL — ABNORMAL HIGH (ref 70–99)
Glucose-Capillary: 135 mg/dL — ABNORMAL HIGH (ref 70–99)
Glucose-Capillary: 139 mg/dL — ABNORMAL HIGH (ref 70–99)

## 2023-10-04 LAB — MAGNESIUM: Magnesium: 2.4 mg/dL (ref 1.7–2.4)

## 2023-10-04 MED ORDER — TRAVASOL 10 % IV SOLN
INTRAVENOUS | Status: AC
  Filled 2023-10-04: qty 604.8

## 2023-10-04 MED ORDER — INSULIN GLARGINE-YFGN 100 UNIT/ML ~~LOC~~ SOLN
14.0000 [IU] | Freq: Two times a day (BID) | SUBCUTANEOUS | Status: DC
  Administered 2023-10-04: 14 [IU] via SUBCUTANEOUS
  Filled 2023-10-04 (×2): qty 0.14

## 2023-10-04 NOTE — Plan of Care (Signed)
   Problem: Education: Goal: Knowledge of General Education information will improve Description: Including pain rating scale, medication(s)/side effects and non-pharmacologic comfort measures Outcome: Progressing   Problem: Clinical Measurements: Goal: Respiratory complications will improve Outcome: Progressing   Problem: Activity: Goal: Risk for activity intolerance will decrease Outcome: Progressing

## 2023-10-04 NOTE — Progress Notes (Signed)
 PHARMACY - TOTAL PARENTERAL NUTRITION CONSULT NOTE   Indication: intolerance of TF   Patient Measurements: Height: 5\' 10"  (177.8 cm) Weight: 67.7 kg (149 lb 4 oz) IBW/kg (Calculated) : 73 TPN AdjBW (KG): 73.9 Body mass index is 21.42 kg/m. 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 He then had a R VATS with decortication, drainage and esophageal stent 08/20/23. He had multiple J tube exchanges that decreased leakage. 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 He was discharged 4/28 but then readmitted 5/2 with failure to thrive, increased J tube leakage, poor po intake, and milky output from chest tube. Johny Nap TF was restarted 5/9 and was tolerating but started developing abdominal pressure/cramping and loose stools around 5/12. TF was turned off 5/13 PM per patient request. During this time chest tube output continued to be milky white but slowed. 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 Per patient, abdomen feels "terrible" with a lot of bloating and pressure. Patient having a lot of flatus and loose stools with a light tan color close to the color of his tube feeds. Pharmacy consulted for TPN to supplement TF. Per IV team, ok to use port for TPN.  Glucose/Insulin : BG < 150, A1C 7.1, used 3 units SSI/24hr and received glargine 18 units pm 5/16 and 14 units am 5/17 Electrolytes: Na up 147, K 4.2, Phos 3.6, Mg 2.4, CoCa 9.9 Renal: Scr 1.86 (BL 1.3), BUN 48 Hepatic: alk phos 177, others wnl, albumin  2 Intake / Output; MIVF: TF not charted, FW q4h added; UOP 550 ml + 4 occurrences charted, LBM 5/16 x2 GI Imaging: 5/9 KUB: nonobstructive bowel gas pattern GI Surgeries / Procedures:   5/5 EGD stent removal, inspect lumen, new stent placed   Central access: ok to use Port per IV team, double lumen PICC 5/16  TPN start date: 5/16   Nutritional Goals: Goal TPN rate is 45 mL/hr (provides 60 g of protein and 1204 kcals per day)  RD Assessment (TPN + TF): Total Estimated Needs Total Energy Estimated Needs: 2200-2400 kcal Total Protein Estimated Needs: 115-130 gm Total Fluid Estimated Needs: >2L/day  Per RD 5/16, TPN recommendations:  60g protein  1200 kcals   Current Nutrition:  TPN and Tube feeding 5/16 TF at goal 21ml/hr but planning to change to [facility] 1.5 cal/ml at lower rate   Plan:  Start TPN at goal 45 mL/hr at 1800, meeting 100% of TPN goals  Electrolytes in TPN: Na 0 mEq/L, K 20 mEq/L, Ca 0 mEq/L, Mg 0 mEq/L, and Phos 0 mmol/L. Cl:Ac 1:1 No standard MVI and trace elements to TPN since receiving per tube and in TF  Continue Sensitive q4h SSI  Stop glargine - ok with Marya Smack, PA Add 20 units insulin  to TPN, titrate per response  Monitor TPN labs on Mon/Thurs, and PRN  Thank you for involving pharmacy in this patient's care.  Caroline Cinnamon, PharmD, BCPS Clinical Pharmacist Clinical phone for 10/04/2023 is [phone] 10/04/2023 7:17 AM

## 2023-10-04 NOTE — Plan of Care (Signed)

## 2023-10-04 NOTE — Progress Notes (Addendum)
      [address]       [name] [address]             [phone]       9 Days Post-Op Procedure(s) (LRB): BRONCHOSCOPY, FLEXIBLE (N/A)  Subjective: Patient still with loose stools, "no better and no worse"  Objective: Vital signs in last 24 hours: Temp:  [97.7 F (36.5 C)-97.8 F (36.6 C)] 97.7 F (36.5 C) (05/17 0303) Pulse Rate:  [75-90] 75 (05/17 0303) Cardiac Rhythm: Normal sinus rhythm (05/16 1900) Resp:  [20] 20 (05/17 0303) BP: (109-125)/(69-76) 109/69 (05/17 0303) SpO2:  [96 %-99 %] 97 % (05/17 0303)     Intake/Output from previous day: 05/16 0701 - 05/17 0700 In: 1168.8 [I.V.:468.8; IV Piggyback:700] Out: 590 [Urine:550; Chest Tube:40]   Physical Exam:  Cardiovascular: RRR, no murmurs, gallops, or rubs. Pulmonary: Clear to auscultation bilaterally; no rales, wheezes, or rhonchi. Abdomen: Soft, non tender, bowel sounds present. Extremities: Mild bilateral lower extremity edema. Wounds: J tube wound with inflammation, induration. Right chest Tube: White drainage in the pleura vac  Lab Results: CBC: Recent Labs    10/02/23 0547  WBC 9.5  HGB 8.6*  HCT 28.4*  PLT 357   BMET:  Recent Labs    10/02/23 0547 10/03/23 0725  NA 144 145  K 4.1 4.0  CL 110 114*  CO2 23 24  GLUCOSE 136* 130*  BUN 53* 48*  CREATININE 1.88* 1.94*  CALCIUM  8.0* 8.1*    PT/INR: No results for input(s): "LABPROT", "INR" in the last 72 hours. ABG:  INR: Will add last result for INR, ABG once components are confirmed Will add last 4 CBG results once components are confirmed  Assessment/Plan: CV-SR. On Amlodipine  10 mg daily per tube Pulmonary-on room air. History of chylothorax-s/p coil and glue embolization by IR on 04/24. Right chest tube with 40 cc of white fluid GI-NPO, TPN at 45 ml/hr and TFs at 25 ml/hr Severe malnutrition-per RD, TPN with gradual re introduction of TFs in order to try and optimize nutritional status. Continue free water    5. History of CKD (stage III)-creatinine yesterday slightly increased to 1.94. Await this am's result. 6. DM-CBGs 113/134/103. On Semglee  18 units bid. Will decrease to avoid hypoglycemia. 7. ID-Cefepime , Vancomycin , and Flagyl . Sputum culture shows Staph Aureus. Susceptibilities to follow. 8.  Continue current wound care for J tube wound 9. On Lovenox  for DVT prophylaxis  Donielle M ZimmermanPA-C 10/04/2023,8:08 AM   Chart reviewed, patient examined, agree with above.  He is on TNA  and low rate TF now. Says he is still having loose stools.  CT output low. Still coughing up greenish sputum. Culture growing sensitive staph aureus.  Wife is hoping to get him to [facility] to evaluate the esophageal leak.

## 2023-10-05 LAB — GLUCOSE, CAPILLARY
Glucose-Capillary: 123 mg/dL — ABNORMAL HIGH (ref 70–99)
Glucose-Capillary: 132 mg/dL — ABNORMAL HIGH (ref 70–99)
Glucose-Capillary: 139 mg/dL — ABNORMAL HIGH (ref 70–99)
Glucose-Capillary: 139 mg/dL — ABNORMAL HIGH (ref 70–99)
Glucose-Capillary: 164 mg/dL — ABNORMAL HIGH (ref 70–99)
Glucose-Capillary: 187 mg/dL — ABNORMAL HIGH (ref 70–99)

## 2023-10-05 LAB — BASIC METABOLIC PANEL WITH GFR
Anion gap: 7 (ref 5–15)
BUN: 50 mg/dL — ABNORMAL HIGH (ref 8–23)
CO2: 21 mmol/L — ABNORMAL LOW (ref 22–32)
Calcium: 8 mg/dL — ABNORMAL LOW (ref 8.9–10.3)
Chloride: 113 mmol/L — ABNORMAL HIGH (ref 98–111)
Creatinine, Ser: 1.61 mg/dL — ABNORMAL HIGH (ref 0.61–1.24)
GFR, Estimated: 47 mL/min — ABNORMAL LOW (ref >60)
Glucose, Bld: 138 mg/dL — ABNORMAL HIGH (ref 70–99)
Potassium: 3.5 mmol/L (ref 3.5–5.1)
Sodium: 141 mmol/L (ref 135–145)

## 2023-10-05 MED ORDER — KATE FARMS PEPTIDE 1.5 EN LIQD
25.0000 mL/h | ENTERAL | Status: DC
  Administered 2023-10-06: 25 mL/h via JEJUNOSTOMY
  Filled 2023-10-05 (×2): qty 1000

## 2023-10-05 MED ORDER — HEPARIN SOD (PORK) LOCK FLUSH 100 UNIT/ML IV SOLN
500.0000 [IU] | Freq: Once | INTRAVENOUS | Status: AC
  Administered 2023-10-05: 500 [IU] via INTRAVENOUS

## 2023-10-05 MED ORDER — POTASSIUM CHLORIDE 20 MEQ PO PACK
40.0000 meq | PACK | Freq: Once | ORAL | Status: AC
  Administered 2023-10-05: 40 meq
  Filled 2023-10-05: qty 2

## 2023-10-05 MED ORDER — CEFAZOLIN SODIUM-DEXTROSE 2-4 GM/100ML-% IV SOLN
2.0000 g | Freq: Three times a day (TID) | INTRAVENOUS | Status: DC
Start: 2023-10-05 — End: 2023-10-14
  Administered 2023-10-05 – 2023-10-14 (×27): 2 g via INTRAVENOUS
  Filled 2023-10-05 (×27): qty 100

## 2023-10-05 MED ORDER — TRAVASOL 10 % IV SOLN
INTRAVENOUS | Status: AC
  Filled 2023-10-05 (×2): qty 604.8

## 2023-10-05 NOTE — Plan of Care (Signed)
  Problem: Education: Goal: Knowledge of General Education information will improve Description: Including pain rating scale, medication(s)/side effects and non-pharmacologic comfort measures Outcome: Progressing   Problem: Health Behavior/Discharge Planning: Goal: Ability to manage health-related needs will improve Outcome: Progressing   Problem: Clinical Measurements: Goal: Ability to maintain clinical measurements within normal limits will improve Outcome: Progressing Goal: Will remain free from infection Outcome: Progressing Goal: Diagnostic test results will improve Outcome: Progressing Goal: Respiratory complications will improve Outcome: Progressing Goal: Cardiovascular complication will be avoided Outcome: Progressing   Problem: Activity: Goal: Risk for activity intolerance will decrease Outcome: Progressing   Problem: Nutrition: Goal: Adequate nutrition will be maintained Outcome: Progressing   Problem: Coping: Goal: Level of anxiety will decrease Outcome: Progressing   Problem: Pain Managment: Goal: General experience of comfort will improve and/or be controlled Outcome: Progressing   Problem: Safety: Goal: Ability to remain free from injury will improve Outcome: Progressing   Problem: Skin Integrity: Goal: Risk for impaired skin integrity will decrease Outcome: Progressing   Problem: Education: Goal: Ability to describe self-care measures that may prevent or decrease complications (Diabetes Survival Skills Education) will improve Outcome: Progressing   Problem: Coping: Goal: Ability to adjust to condition or change in health will improve Outcome: Progressing   Problem: Fluid Volume: Goal: Ability to maintain a balanced intake and output will improve Outcome: Progressing   Problem: Health Behavior/Discharge Planning: Goal: Ability to identify and utilize available resources and services will improve Outcome: Progressing Goal: Ability to manage  health-related needs will improve Outcome: Progressing   Problem: Metabolic: Goal: Ability to maintain appropriate glucose levels will improve Outcome: Progressing   Problem: Nutritional: Goal: Maintenance of adequate nutrition will improve Outcome: Progressing Goal: Progress toward achieving an optimal weight will improve Outcome: Progressing   Problem: Skin Integrity: Goal: Risk for impaired skin integrity will decrease Outcome: Progressing   Problem: Tissue Perfusion: Goal: Adequacy of tissue perfusion will improve Outcome: Progressing

## 2023-10-05 NOTE — Progress Notes (Signed)
 PHARMACY - TOTAL PARENTERAL NUTRITION CONSULT NOTE   Indication: intolerance of TF   Patient Measurements: Height: 5\' 10"  (177.8 cm) Weight: 70.8 kg (156 lb 1.4 oz) IBW/kg (Calculated) : 73 TPN AdjBW (KG): 73.9 Body mass index is 22.4 kg/m. 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 He then had a R VATS with decortication, drainage and esophageal stent 08/20/23. He had multiple J tube exchanges that decreased leakage. 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 He was discharged 4/28 but then readmitted 5/2 with failure to thrive, increased J tube leakage, poor po intake, and milky output from chest tube. Seth Burch TF was restarted 5/9 and was tolerating but started developing abdominal pressure/cramping and loose stools around 5/12. TF was turned off 5/13 PM per patient request. During this time chest tube output continued to be milky white but slowed. 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 Per patient, abdomen feels "terrible" with a lot of bloating and pressure. Patient having a lot of flatus and loose stools with a light tan color close to the color of his tube feeds. Pharmacy consulted for TPN to supplement TF. Per IV team, ok to use port for TPN.  Glucose/Insulin : BG < 150, A1C 7.1, used 3 units SSI/24hr, glargine 14 units am 5/17, and 20 units in TPN Electrolytes: Na down 141, K 3.5, CoCa 9.6, others wnl Renal: Scr 1.61 (BL 1.3), BUN 48 Hepatic: alk phos 177, others wnl, albumin  2 Intake / Output; MIVF: TF not charted, FW q4h added; UOP charted, LBM 5/17 x5 (loose stools) GI Imaging: 5/9 KUB: nonobstructive bowel gas pattern GI Surgeries / Procedures:  5/5 EGD stent  removal, inspect lumen, new stent placed   Central access: ok to use Port per IV team, double lumen PICC 5/16  TPN start date: 5/16   Nutritional Goals: Goal TPN rate is 45 mL/hr (provides 60 g of protein and 1204 kcals per day)  RD Assessment (TPN + TF): Total Estimated Needs Total Energy Estimated Needs: 2200-2400 kcal Total Protein Estimated Needs: 115-130 gm Total Fluid Estimated Needs: >2L/day  Per RD 5/16, TPN recommendations:  60g protein  1200 kcals   Current Nutrition:  TPN and Tube feeding 5/16 TF at goal 87ml/hr but planning to change to [facility] 1.5 cal/ml at lower rate   Plan:  Continue TPN at goal 45 mL/hr at 1800, meeting 100% of TPN goals  Electrolytes in TPN: Na 0 mEq/L, increase K 40 mEq/L, Ca 0 mEq/L, Mg 0 mEq/L, and Phos 0 mmol/L. Cl:Ac 1:1 No standard MVI and trace elements to TPN since receiving per tube and in TF  Continue Sensitive q4h SSI  Glargine stopped 5/17 Add 25 units insulin  to TPN, titrate per response  KCl 40 mEq per tube x1 Monitor TPN labs on Mon/Thurs, and PRN  Thank you for involving pharmacy in this patient's care.  Caroline Cinnamon, PharmD, BCPS Clinical Pharmacist Clinical phone for 10/05/2023 is [phone] 10/05/2023 7:07 AM

## 2023-10-05 NOTE — Progress Notes (Addendum)
      [address]       [place] [address]             [phone]       10 Days Post-Op Procedure(s) (LRB): BRONCHOSCOPY, FLEXIBLE (N/A)  Subjective: Patient with fairly persistent loose stools;just had another loose stool.  Objective: Vital signs in last 24 hours: Temp:  [97.6 F (36.4 C)-98.4 F (36.9 C)] 97.7 F (36.5 C) (05/18 0428) Pulse Rate:  [72-87] 74 (05/18 0428) Cardiac Rhythm: Normal sinus rhythm (05/17 1925) Resp:  [18-20] 20 (05/18 0428) BP: (100-134)/(67-76) 115/71 (05/18 0428) SpO2:  [97 %-100 %] 98 % (05/18 0428) Weight:  [70.8 kg] 70.8 kg (05/18 0500)     Intake/Output from previous day: 05/17 0701 - 05/18 0700 In: 6977 [P.O.:240; I.V.:1185.2; NU/UV:2536.6; IV Piggyback:700.2] Out: 1575 [Urine:1575]   Physical Exam:  Cardiovascular: RRR Pulmonary: Clear to auscultation on left and slightly diminished right base Abdomen: Soft, non tender, bowel sounds present. Extremities: Trace bilateral lower extremity edema. Wounds: J tube wound with inflammation, induration. Right chest Tube: White drainage in the pleura vac  Lab Results: CBC: Recent Labs    10/04/23 0955  WBC 11.7*  HGB 8.9*  HCT 29.2*  PLT 387   BMET:  Recent Labs    10/04/23 1020 10/05/23 0420  NA 147* 141  K 4.2 3.5  CL 115* 113*  CO2 22 21*  GLUCOSE 120* 138*  BUN 51* 50*  CREATININE 1.86* 1.61*  CALCIUM  8.3* 8.0*    PT/INR: No results for input(s): "LABPROT", "INR" in the last 72 hours. ABG:  INR: Will add last result for INR, ABG once components are confirmed Will add last 4 CBG results once components are confirmed  Assessment/Plan: CV-SR. On Amlodipine  10 mg daily per tube Pulmonary-on room air. History of chylothorax-s/p coil and glue embolization by IR on 04/24. Right chest tube with scant output last 24 hours ( white fluid) GI-NPO, TPN at 45 ml/hr and TFs at 25 ml/hr. Persistent diarrhea. As discussed with Dr. Sherene Dilling, will hold TFs  today. Severe malnutrition-per RD, TPN with gradual re introduction of TFs in order to try and optimize nutritional status. Continue free water   5. History of CKD (stage III)-creatinine this am decreased to 1.61. 6. DM-CBGs 135/123/139. On Insulin  PRN. 7. ID-Cefepime , Vancomycin , and Flagyl . Sputum culture shows Staph Aureus.  8.  Continue current wound care for J tube wound 9. On Lovenox  for DVT prophylaxis 10. Supplement potassium via tube 11. Once nutrition status improved, hope to be evaluated at [facility] ZimmermanPA-C 10/05/2023,7:22 AM    Chart reviewed, patient examined, agree with above.  I think we just have to stop tube feeds with persistent diarrhea and see what other options there are. Continue TNA at goal.

## 2023-10-06 LAB — COMPREHENSIVE METABOLIC PANEL WITH GFR
ALT: 14 U/L (ref 0–44)
ALT: 15 U/L (ref 0–44)
AST: 24 U/L (ref 15–41)
AST: 26 U/L (ref 15–41)
Albumin: 1.8 g/dL — ABNORMAL LOW (ref 3.5–5.0)
Albumin: 1.9 g/dL — ABNORMAL LOW (ref 3.5–5.0)
Alkaline Phosphatase: 137 U/L — ABNORMAL HIGH (ref 38–126)
Alkaline Phosphatase: 157 U/L — ABNORMAL HIGH (ref 38–126)
Anion gap: 9 (ref 5–15)
Anion gap: 6 (ref 5–15)
BUN: 51 mg/dL — ABNORMAL HIGH (ref 8–23)
BUN: 50 mg/dL — ABNORMAL HIGH (ref 8–23)
CO2: 18 mmol/L — ABNORMAL LOW (ref 22–32)
CO2: 19 mmol/L — ABNORMAL LOW (ref 22–32)
Calcium: 8 mg/dL — ABNORMAL LOW (ref 8.9–10.3)
Calcium: 8.3 mg/dL — ABNORMAL LOW (ref 8.9–10.3)
Chloride: 105 mmol/L (ref 98–111)
Chloride: 116 mmol/L — ABNORMAL HIGH (ref 98–111)
Creatinine, Ser: 1.47 mg/dL — ABNORMAL HIGH (ref 0.61–1.24)
Creatinine, Ser: 1.52 mg/dL — ABNORMAL HIGH (ref 0.61–1.24)
GFR, Estimated: 52 mL/min — ABNORMAL LOW (ref >60)
GFR, Estimated: 50 mL/min — ABNORMAL LOW (ref >60)
Glucose, Bld: 702 mg/dL (ref 70–99)
Glucose, Bld: 151 mg/dL — ABNORMAL HIGH (ref 70–99)
Potassium: 5.7 mmol/L — ABNORMAL HIGH (ref 3.5–5.1)
Potassium: 4 mmol/L (ref 3.5–5.1)
Sodium: 132 mmol/L — ABNORMAL LOW (ref 135–145)
Sodium: 141 mmol/L (ref 135–145)
Total Bilirubin: 0.5 mg/dL (ref 0.0–1.2)
Total Bilirubin: 0.7 mg/dL (ref 0.0–1.2)
Total Protein: 6.3 g/dL — ABNORMAL LOW (ref 6.5–8.1)
Total Protein: 6.4 g/dL — ABNORMAL LOW (ref 6.5–8.1)

## 2023-10-06 LAB — GLUCOSE, CAPILLARY
Glucose-Capillary: 103 mg/dL — ABNORMAL HIGH (ref 70–99)
Glucose-Capillary: 125 mg/dL — ABNORMAL HIGH (ref 70–99)
Glucose-Capillary: 131 mg/dL — ABNORMAL HIGH (ref 70–99)
Glucose-Capillary: 166 mg/dL — ABNORMAL HIGH (ref 70–99)
Glucose-Capillary: 176 mg/dL — ABNORMAL HIGH (ref 70–99)
Glucose-Capillary: 88 mg/dL (ref 70–99)

## 2023-10-06 LAB — CBC
HCT: 28.7 % — ABNORMAL LOW (ref 39.0–52.0)
Hemoglobin: 8.9 g/dL — ABNORMAL LOW (ref 13.0–17.0)
MCH: 29.6 pg (ref 26.0–34.0)
MCHC: 31 g/dL (ref 30.0–36.0)
MCV: 95.3 fL (ref 80.0–100.0)
Platelets: 390 10*3/uL (ref 150–400)
RBC: 3.01 MIL/uL — ABNORMAL LOW (ref 4.22–5.81)
RDW: 17.9 % — ABNORMAL HIGH (ref 11.5–15.5)
WBC: 12.8 10*3/uL — ABNORMAL HIGH (ref 4.0–10.5)
nRBC: 0 % (ref 0.0–0.2)

## 2023-10-06 LAB — PHOSPHORUS: Phosphorus: 1.9 mg/dL — ABNORMAL LOW (ref 2.5–4.6)

## 2023-10-06 LAB — MAGNESIUM: Magnesium: 2.1 mg/dL (ref 1.7–2.4)

## 2023-10-06 LAB — TRIGLYCERIDES: Triglycerides: 446 mg/dL — ABNORMAL HIGH (ref <150)

## 2023-10-06 MED ORDER — INSULIN REGULAR HUMAN 100 UNIT/ML IJ SOLN
INTRAMUSCULAR | Status: AC
  Filled 2023-10-06: qty 864

## 2023-10-06 MED ORDER — TRAVASOL 10 % IV SOLN
INTRAVENOUS | Status: DC
  Filled 2023-10-06: qty 604.8

## 2023-10-06 MED ORDER — SODIUM PHOSPHATES 45 MMOLE/15ML IV SOLN
30.0000 mmol | Freq: Once | INTRAVENOUS | Status: AC
  Administered 2023-10-06: 30 mmol via INTRAVENOUS
  Filled 2023-10-06: qty 10

## 2023-10-06 NOTE — Progress Notes (Addendum)
 Nutrition Follow-up  DOCUMENTATION CODES:   Severe malnutrition in context of chronic illness  INTERVENTION:  Discontinue tube feeding d/t intolerance   Modify TPN regimen to meet 100% estimated needs To be managed by pharmacy    Continue MVI with minerals daily  Continue Pro-Source TF20 60 mL daily , each packet provides 20 grams of protein and 80 kcals.   Continue nutrisource fiber BID to provide 3g soluble fiber per serving to aid in bulking of stool, if indicated Monitor for diet advancement and assess tolerance  NUTRITION DIAGNOSIS:  Severe Malnutrition related to chronic illness (esophageal cancer s/p chemo, radiation and surgery) as evidenced by severe muscle depletion, severe fat depletion, percent weight loss. - remains applicable  GOAL:  Patient will meet greater than or equal to 90% of their needs - to be met via TPN   MONITOR:  Diet advancement, TF tolerance, Weight trends, Labs, PO intake  REASON FOR ASSESSMENT:  Consult Enteral/tube feeding initiation and management  ASSESSMENT:   68 y.o Male with multiple readmissions due to complications after esophagectomy and J tube placement on 3/10 including, anastomosis leaks, dysphagia, chylothorax, FTT. Now presents with persistent coughing episodes, SOB, J tube drainage, esophageal anastomotic leak. PMH of DM, HTN, dyslipedemia, CKD, esophageal cancer, IgG kappa MGUS.  Previous Admission 07/28/2023-08/19/2023 3/10 EGD, XI Robotic Assisted Ivor Lewis Esophagectomy, J-tube placement 3/11 Trickle TF initiated 3/12 Surgery increased TF to 40 ml/hr 3/13 Surgery increased TF to 55 ml/hr, transferred to ICU  3/14 Surgery increased TF to 65 ml/hr (goal), bowel regimen added and +BM 3/15 Intubated, Bronch (Thick mucus in trachea suctioned, partially occluding L mainstem, ?aspiration, recurrent false vocal cord irritation with swelling leading to stridor and resp failure) 3/17 Extubated 3/18 Esophogram:  Brisk tracheal aspiration  of ingested contrast, into the LEFT mainstem bronchus. SLP eval recommended. Stagnant contrast at diaphragmatic hiatus. No anastomotic leak.  3/19 Diet advanced to Dysphagia 2/Thins, later downgraded to Nectar Thick Liquids 3/20 Transition to Nocturnal TF, family to bring protein shake in from home 3/21 Significant diarrhea overnight, (9 stools in 10 hours), significant J-tube drainage, change to Vivonex with day time feedings 3/24 Diet changed to Dysphagia 2, Thins per SLP 3/26 NPO due to concern for possible esophageal leak 3/27 advanced to Dysphagia 3, thin liquids  3/31 modify TF regimen for d/c 4/1 discharged home Previous Admission 08/19/2023-09/15/2023 4/2 admitted; OR: R VATS, EGD, and esophageal stent placement 4/3 trial Johny Nap TF formula during the day (16 hours), transfusion 4/4 advanced to goal rate; leaking J-tube, TF held  4/5 txr to cardiovascular ICU 4/7 TF re-initiated, transfused  4/9 J-tube replaced by IR 4/10 significant leaking from J-tube; TFs on hold  4/11 PICC placed, TPN initiated, J-tube replaced again, WOUND VAC 4/12 re-initiate trickles s/p second J-tube replacement 4/13 PICC line replaced 4/14 slowly advance TF goal rate 4/16 TF to goal rate; TPN discontinued 4/17 chylothorax: transition to Vivonex and very low fat diet 4/19 TF reduced due to feelings of fullness/diarrhea 4/21 TF rate increased to 40ml/hr 4/24 embolization of chyle leak 4/25 TF increased to 66ml/hr 4/28 discharged home Current Admission 5/2 Admitted Viovonex started @ 65 ml/hr, J tube drainage and chylothorax persists 5/5 EGD w/ esophageal stent replacement 5/6 transfused 5/8  bronchoscopy: no acute findings; evidence of aspiration 5/9 transitioned to [facility] 1.4 5/15 5 loose stools x24 hours 5/16 transition to [employer] 1.5 Peptide and supplemental TPN  5/18 TF on hold d/t abdominal pain and discomfort 5/19 TF d/c and  TPN to meet 100% of needs  CT output continues to improve  with 40ml output documented yesterday. Wife hoping to get patient to [facility] for assessment of esophageal leak once nutrition improves.  Pt now not even tolerating trickle rate of tube feed regimen he previously tolerated. Continues with c/o abdominal pain, bloating, and multiple loose stools per day. TF on hold yesterday d/t the same issue. Spoke with surgery PA-C via secure that this morning, who is amicable to increasing TPN regimen to meet 100% of patient's estimated calorie and protein needs. Pharmacy to adjust. Recommend exploring etiology of GI concerns, as patient has previously tolerated this formula and any of his previous formulas at lower rates.  Admit Weight: 73.9kg Current Weight: 70kg   Remains NPO. Bowels remain frequent and loose. Weight trending down as he continues to not sufficiently meet his estimated needs 2/2 holding TF r/t intolerance.    Intake/Output Summary (Last 24 hours) at 10/06/2023 1019 Last data filed at 10/06/2023 0800 Gross per 24 hour  Intake 674.9 ml  Output 550 ml  Net 124.9 ml    Drains/Lines: LUQ: Jejunostomy (20 Fr) R Lateral Pleural (19Fr): 40ml x24 hours UOP: + 3 unmeasured occurrence  x24 hours   Meds: famotidine , folic acid ,  SSI 4 units + 0-9 q4 hours, Semglee  18 BID,  lactulose , MVI, thiamine , IV ABX   Labs:  Na+ 145 (wdl) K+ 4.0 (wdl) CBGs 130-136 x24 hours A1c 7.1 (07/2023)   Diet Order:   Diet Order             Diet NPO time specified  Diet effective now            EDUCATION NEEDS:  Education needs have been addressed  Skin:  Skin Assessment: Skin Integrity Issues: Incisions: R chest tube Other: MASD around J-tube  Last BM:  5/18 - type 7 x3  Height:  Ht Readings from Last 1 Encounters:  09/25/23 5\' 10"  (1.778 m)   Weight:  Wt Readings from Last 1 Encounters:  10/06/23 70 kg   Ideal Body Weight:  75.5 kg  BMI:  Body mass index is 22.14 kg/m.  Estimated Nutritional Needs:   Kcal:  2200-2400  kcal  Protein:  115-130 gm  Fluid:  >2L/day  Con Decant MS, RD, LDN Registered Dietitian Clinical Nutrition RD Inpatient Contact Info in Amion

## 2023-10-06 NOTE — Progress Notes (Signed)
 Physical Therapy Treatment Patient Details Name: RUDOLFO BRANDOW MRN: [id number] DOB: 22-Jan-1956 Today's Date: 10/06/2023   History of Present Illness 68 y.o. male presents to [facility]/19/23 after follow-up with Dr.Lightfoot with failure to thrive, low O2 sats/SOB and milky-output of chest tube. Pt with chylothorax, esophageal anastomotic leak, and J tube bilious drainage. EGD on 5/5 with stent removal and inspection of esophageal lumen revealed persistent perforation. New esoph. stent placed. [name] Extensive prior admits 3/10-4/1 and 4/1-4/28. 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 Pt able to walk with CGA with RW x 75 ft, seated rest, x75 ft, seated rest, x150 ft. Safety/sequencing with RW continues to require cues. Did so well it would be good to work on walking without a device.     If plan is discharge home, recommend the following: Assistance with cooking/housework;Assist for transportation;Help with stairs or ramp for entrance;A little help with walking and/or transfers;A little help with bathing/dressing/bathroom   Can travel by private vehicle        Equipment Recommendations  None recommended by PT    Recommendations for Other Services       Precautions / Restrictions Precautions Precautions: Fall Recall of Precautions/Restrictions: Intact Precaution/Restrictions Comments: L UQ jejunostomy, Rt JP drain Restrictions Weight Bearing Restrictions Per Provider Order: No     Mobility  Bed Mobility Overal bed mobility: Needs Assistance Bed Mobility: Rolling, Sidelying to Sit, Sit to Supine Rolling: Contact guard assist Sidelying to sit: Min assist, HOB elevated, Used rails   Sit to supine: Contact guard assist   General bed mobility comments: cues for technique for up to EOB     Transfers Overall transfer level: Needs assistance Equipment used: Rolling walker (2 wheels) Transfers: Sit to/from Stand Sit to Stand: Contact guard assist           General transfer comment: cues for sequencing; repeated x 3    Ambulation/Gait Ambulation/Gait assistance: Contact guard assist Gait Distance (Feet): 75 Feet (seated rest; 75 seated rest; 150) Assistive device: Rolling walker (2 wheels) (chair follow) Gait Pattern/deviations: WFL(Within Functional Limits) Gait velocity: decr         Stairs             Wheelchair Mobility     Tilt Bed    Modified Rankin (Stroke Patients Only)       Balance Overall balance assessment: Needs assistance, Mild deficits observed, not formally tested Sitting-balance support: Feet supported, No upper extremity supported Sitting balance-Leahy Scale: Fair     Standing balance support: Reliant on assistive device for balance Standing balance-Leahy Scale: Fair                              Hotel manager: No apparent difficulties  Cognition Arousal: Alert Behavior During Therapy: Flat affect   PT - Cognitive impairments: No apparent impairments                         Following commands: Intact Following commands impaired: Follows one step commands inconsistently, Follows one step commands with increased time    Cueing Cueing Techniques: Verbal cues, Gestural cues  Exercises      General Comments General comments (skin integrity, edema, etc.): HR 80s, Sats >93% on RA  Pertinent Vitals/Pain Pain Assessment Pain Assessment: Faces Faces Pain Scale: Hurts little more Pain Location: headache Pain Descriptors / Indicators: Headache Pain Intervention(s): Limited activity within patient's tolerance, Monitored during session    Home Living                          Prior Function            PT Goals (current goals can now be found in the  care plan section) Acute Rehab PT Goals Patient Stated Goal: to go home PT Goal Formulation: With patient/family Time For Goal Achievement: 10/20/23 Potential to Achieve Goals: Good Progress towards PT goals: Progressing toward goals    Frequency    Min 3X/week      PT Plan      Co-evaluation              AM-PAC PT "6 Clicks" Mobility   Outcome Measure  Help needed turning from your back to your side while in a flat bed without using bedrails?: A Little Help needed moving from lying on your back to sitting on the side of a flat bed without using bedrails?: A Little Help needed moving to and from a bed to a chair (including a wheelchair)?: A Little Help needed standing up from a chair using your arms (e.g., wheelchair or bedside chair)?: A Little Help needed to walk in hospital room?: A Little Help needed climbing 3-5 steps with a railing? : A Lot 6 Click Score: 17    End of Session   Activity Tolerance: Patient limited by fatigue Patient left: in bed;with call bell/phone within reach;with nursing/sitter in room Nurse Communication: Mobility status PT Visit Diagnosis: Other abnormalities of gait and mobility (R26.89);Unsteadiness on feet (R26.81);Muscle weakness (generalized) (M62.81)     Time: 8469-6295 PT Time Calculation (min) (ACUTE ONLY): 31 min  Charges:    $Gait Training: 8-22 mins PT General Charges $$ ACUTE PT VISIT: 1 Visit                      Gayle Kava, PT Acute Rehabilitation Services  Office [phone]    Guilford Leep 10/06/2023, 4:42 PM

## 2023-10-06 NOTE — Progress Notes (Addendum)
 PHARMACY - TOTAL PARENTERAL NUTRITION CONSULT NOTE   Indication: intolerance of TF   Patient Measurements: Height: 5\' 10"  (177.8 cm) Weight: 70 kg (154 lb 5.2 oz) IBW/kg (Calculated) : 73 TPN AdjBW (KG): 73.9 Body mass index is 22.14 kg/m. 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 He then had a R VATS with decortication, drainage and esophageal stent 08/20/23. He had multiple J tube exchanges that decreased leakage. 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 He was discharged 4/28 but then readmitted 5/2 with failure to thrive, increased J tube leakage, poor po intake, and milky output from chest tube. Johny Nap TF was restarted 5/9 and was tolerating but started developing abdominal pressure/cramping and loose stools around 5/12. TF was turned off 5/13 PM per patient request. During this time chest tube output continued to be milky white but slowed. 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 Per patient, abdomen feels "terrible" with a lot of bloating and pressure. Patient having a lot of flatus and loose stools with a light tan color close to the color of his tube feeds. Pharmacy consulted for TPN to supplement TF. Per IV team, ok to use port for TPN.  Glucose/Insulin : BG 88-187 [serum glucose at 05:43 702 / CBG 103 at 04:00], A1C 7.1, used 6 units SSI/24hr / 25 units in TPN Electrolytes: Na 132, Ch 105, K 5.7, Ca 8.0 [CoCa 9.76], Phos 1.9, mag 2.1 Renal: Scr 1.47 (BL 1.3), BUN 51 Hepatic: alk phos 137, others wnl, albumin  1.8, TG 446 Intake / Output; MIVF: TF [place] 1.5 cal/mL at 25 ml/hr, FW q4h added; UOP 180 ml + 3 occurrences, Chest tube 40 mL, G-tube 30 mL, LBM 5/18 [3  unmeasured] GI Imaging: 5/9 KUB: nonobstructive bowel gas pattern GI Surgeries / Procedures:  5/5 EGD stent removal, inspect lumen, new stent placed   Central access: ok to use Port per IV team, double lumen PICC 5/16  TPN start date: 5/16   Nutritional Goals: Goal TPN rate is 90 mL/hr (provides 130 g of protein and 2290 kcals per day)  RD Assessment: Total Estimated Needs Total Energy Estimated Needs: 2200-2400 kcal Total Protein Estimated Needs: 115-130 gm Total Fluid Estimated Needs: >2L/day  Current Nutrition:  TPN  Plan:  increase TPN to 60 mL/hr at 1800, meeting ~67% of TPN goals  Electrolytes in TPN: Na 30 mEq/L, K 0 mEq/L, Ca 0 mEq/L, Mg 0 mEq/L, and Phos 11.5 mmol/L. Cl:Ac 1:2 No standard MVI and trace elements to TPN since receiving per tube and in TF  Na Phos 30 mmol iv x1 Bmet / Phos AM labs 5/20 Continue Sensitive q4h SSI  Glargine stopped 5/17 Continue 25 units insulin  to TPN, titrate per response  Monitor TPN labs on Mon/Thurs, and PRN  Thank you for involving pharmacy in this patient's care.  Marleta Simmer BS, PharmD, BCPS Clinical Pharmacist 10/06/2023 10:22 AM  Contact: [phone] after 3 PM  "Be curious, not judgmental..." -Rumalda Counter

## 2023-10-06 NOTE — Progress Notes (Signed)
 Family at bedside requesting to speak with Dr. Deloise Ferries in regards to plan, where we go from here, etc. Spoke on phone to Dr. Deloise Ferries who stated he will be up to see patient and speak with family this afternoon.

## 2023-10-06 NOTE — Plan of Care (Signed)
  Problem: Education: Goal: Knowledge of General Education information will improve Description: Including pain rating scale, medication(s)/side effects and non-pharmacologic comfort measures Outcome: Progressing   Problem: Health Behavior/Discharge Planning: Goal: Ability to manage health-related needs will improve Outcome: Progressing   Problem: Clinical Measurements: Goal: Ability to maintain clinical measurements within normal limits will improve Outcome: Progressing Goal: Will remain free from infection Outcome: Progressing Goal: Diagnostic test results will improve Outcome: Progressing Goal: Respiratory complications will improve Outcome: Progressing Goal: Cardiovascular complication will be avoided Outcome: Progressing   Problem: Activity: Goal: Risk for activity intolerance will decrease Outcome: Progressing   Problem: Nutrition: Goal: Adequate nutrition will be maintained Outcome: Progressing   Problem: Coping: Goal: Level of anxiety will decrease Outcome: Progressing   Problem: Pain Managment: Goal: General experience of comfort will improve and/or be controlled Outcome: Progressing   Problem: Safety: Goal: Ability to remain free from injury will improve Outcome: Progressing   Problem: Skin Integrity: Goal: Risk for impaired skin integrity will decrease Outcome: Progressing   Problem: Education: Goal: Ability to describe self-care measures that may prevent or decrease complications (Diabetes Survival Skills Education) will improve Outcome: Progressing   Problem: Coping: Goal: Ability to adjust to condition or change in health will improve Outcome: Progressing   Problem: Fluid Volume: Goal: Ability to maintain a balanced intake and output will improve Outcome: Progressing   Problem: Health Behavior/Discharge Planning: Goal: Ability to identify and utilize available resources and services will improve Outcome: Progressing Goal: Ability to manage  health-related needs will improve Outcome: Progressing   Problem: Metabolic: Goal: Ability to maintain appropriate glucose levels will improve Outcome: Progressing   Problem: Nutritional: Goal: Maintenance of adequate nutrition will improve Outcome: Progressing Goal: Progress toward achieving an optimal weight will improve Outcome: Progressing   Problem: Skin Integrity: Goal: Risk for impaired skin integrity will decrease Outcome: Progressing   Problem: Tissue Perfusion: Goal: Adequacy of tissue perfusion will improve Outcome: Progressing

## 2023-10-06 NOTE — Progress Notes (Signed)
 11 Days Post-Op Procedure(s) (LRB): BRONCHOSCOPY, FLEXIBLE (N/A) Subjective: Feels ok, not nauseated, no abdominal pain  Objective: Vital signs in last 24 hours: Temp:  [97.1 F (36.2 C)-97.7 F (36.5 C)] 97.6 F (36.4 C) (05/19 0805) Pulse Rate:  [66-99] 74 (05/19 0404) Cardiac Rhythm: Normal sinus rhythm (05/18 1914) Resp:  [18-24] 24 (05/19 0805) BP: (110-139)/(67-84) 119/77 (05/19 0805) SpO2:  [93 %-96 %] 94 % (05/19 0404) Weight:  [70 kg] 70 kg (05/19 0500)  Hemodynamic parameters for last 24 hours:    Intake/Output from previous day: 05/18 0701 - 05/19 0700 In: 1202.9 [P.O.:50; I.V.:402.9; NG/GT:750] Out: 250 [Urine:180; Drains:30; Chest Tube:40] Intake/Output this shift: No intake/output data recorded.  General appearance: alert, cooperative, and no distress Heart: regular rate and rhythm Lungs: mildly dim in right base Abdomen: soft, non tender Extremities: no edema Wound: dressings in place, currently no drainage  Lab Results: Recent Labs    10/04/23 0955 10/06/23 0543  WBC 11.7* 12.8*  HGB 8.9* 8.9*  HCT 29.2* 28.7*  PLT 387 390   BMET:  Recent Labs    10/04/23 1020 10/05/23 0420  NA 147* 141  K 4.2 3.5  CL 115* 113*  CO2 22 21*  GLUCOSE 120* 138*  BUN 51* 50*  CREATININE 1.86* 1.61*  CALCIUM  8.3* 8.0*    PT/INR: No results for input(s): "LABPROT", "INR" in the last 72 hours. ABG    Component Value Date/Time   PHART 7.4 07/29/2023 0514   HCO3 25.8 08/02/2023 2006   TCO2 17 (L) 08/19/2023 2022   ACIDBASEDEF 1.0 08/02/2023 1506   O2SAT 94.5 08/02/2023 2006   CBG (last 3)  Recent Labs    10/05/23 2033 10/06/23 0051 10/06/23 0400  GLUCAP 132* 88 103*    Meds Scheduled Meds:  amLODipine   10 mg Per Tube Daily   atorvastatin   20 mg Per Tube Daily   Chlorhexidine  Gluconate Cloth  6 each Topical Daily   enoxaparin  (LOVENOX ) injection  40 mg Subcutaneous Q24H   escitalopram   5 mg Per Tube Daily   famotidine   20 mg Per Tube Daily    feeding supplement (PROSource TF20)  60 mL Per Tube Daily   fiber  1 packet Per Tube BID   finasteride   5 mg Oral Daily   folic acid   1 mg Per Tube Daily   free water   150 mL Per Tube Q4H   Gerhardt's butt cream   Topical TID   insulin  aspart  0-9 Units Subcutaneous Q4H   lactulose   20 g Per Tube Once   metoprolol  tartrate  25 mg Per Tube BID   multivitamin with minerals  1 tablet Per Tube Daily   simethicone   80 mg Per Tube QID   sodium chloride  flush  10-40 mL Intracatheter Q12H   sodium chloride  flush  10-40 mL Intracatheter Q12H   thiamine   100 mg Per Tube Daily   Continuous Infusions:   ceFAZolin  (ANCEF ) IV 2 g (10/06/23 0159)   [place] Peptide 1.5 25 mL/hr (10/06/23 0607)   TPN ADULT (ION) 45 mL/hr at 10/05/23 1729   PRN Meds:.acetaminophen  **OR** acetaminophen , chlorpheniramine-HYDROcodone, liver oil-zinc  oxide, morphine  injection, ondansetron  **OR** ondansetron  (ZOFRAN ) IV, oxyCODONE , sodium chloride  flush, sodium chloride  flush  Xrays No results found.  Assessment/Plan: S/P Procedure(s) (LRB): BRONCHOSCOPY, FLEXIBLE (N/A)  1 afeb, VSS, sinus rhythm 2 sats ok on RA 3 TF- white drainage, not measured- does not appear to be much- h/o chylothorax w/ IR embolization 4 labs- TG's 446, CMET pending  5 leukocytosis trending higher a litle 12.8 6 H/H fairly stable 7 NPO- TF's stopped and re-started, also on TPN- cont same- currently not tolerating, has had persist diarrhhea, also significant drainage around the tube feeding site continues.  Getting free water .  TPN will be increased to full dose. 8 ID - placed on ancef  yesterday 9 cbg's ok on prn insulin  10 conts J tube wound care 11 lovenox  for DVT ppx 12 pulm hygiene and mobilize as tolerated     LOS: 17 days    Jaylon Grode E Margery Szostak PA-C 10/06/2023

## 2023-10-06 NOTE — Progress Notes (Signed)
 Occupational Therapy Treatment Patient Details Name: Seth Burch MRN: [id number] DOB: 1955/06/12 Today's Date: 10/06/2023   History of present illness 68 y.o. male presents to [facility] 09/19/23 after follow-up with Dr.Lightfoot with failure to thrive, low O2 sats/SOB and milky-output of chest tube. Pt with chylothorax, esophageal anastomotic leak, and J tube bilious drainage. EGD on 5/5 with stent removal and inspection of esophageal lumen revealed persistent perforation. New esoph. stent placed. [name] Extensive prior admits 3/10-4/1 and 4/1-4/28. 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 Chair follow used for 2 seated rest breaks. Pt with good awareness of when he needed seated rest breaks. May benefit from using rollator to increase distance with seated rest breaks. Pt talked about the relationship he has with his grand daughter and how supportive she has been through his illness. Pt apparently frustrated with his long hospitalization but very appreciative of therapy. Will continue to follow.        If plan is discharge home, recommend the following:  A little help with walking and/or transfers;A little help with bathing/dressing/bathroom;Assistance with cooking/housework;Assist for transportation   Equipment Recommendations  None recommended by OT    Recommendations for Other Services      Precautions / Restrictions Precautions Precautions: Fall Recall of Precautions/Restrictions: Intact Precaution/Restrictions Comments: L UQ jejunostomy, Rt JP drain Restrictions Weight Bearing Restrictions Per Provider Order: No       Mobility Bed Mobility Overal bed mobility: Needs Assistance Bed Mobility: Rolling, Sidelying to Sit, Sit to Supine Rolling:  Contact guard assist Sidelying to sit: Min assist, HOB elevated, Used rails   Sit to supine: Contact guard assist   General bed mobility comments: cues for technique for up to EOB    Transfers Overall transfer level: Needs assistance Equipment used: Rolling walker (2 wheels) Transfers: Sit to/from Stand Sit to Stand: Contact guard assist           General transfer comment: cues for sequencing; repeated x 3     Balance Overall balance assessment: Needs assistance, Mild deficits observed, not formally tested Sitting-balance support: Feet supported, No upper extremity supported Sitting balance-Leahy Scale: Fair     Standing balance support: Reliant on assistive device for balance Standing balance-Leahy Scale: Fair                             ADL either performed or assessed with clinical judgement   ADL Overall ADL's : Needs assistance/impaired     Grooming: Set up                               Functional mobility during ADLs: Supervision/safety;+2 for safety/equipment;Rolling walker (2 wheels) (chair follow. May benefit from use of a rollator) General ADL Comments: focus of session on increasing mobility distances; able to talk and walk with majority of the session    Extremity/Trunk Assessment Upper Extremity Assessment Upper Extremity Assessment: Generalized weakness   Lower Extremity Assessment Lower Extremity Assessment: Defer to PT evaluation        Vision       Perception     Praxis     Communication Communication Communication: No apparent difficulties   Cognition Arousal: Alert Behavior During Therapy:  Flat affect Cognition: No apparent impairments                               Following commands: Intact Following commands impaired: Follows one step commands inconsistently, Follows one step commands with increased time      Cueing   Cueing Techniques: Verbal cues  Exercises      Shoulder  Instructions       General Comments VSS on RA; retired Personnel officer; enjoys helping take care of his grand daughter's care repairs    Pertinent Vitals/ Pain       Pain Assessment Pain Assessment: Faces Faces Pain Scale: Hurts little more Pain Location: headache Pain Descriptors / Indicators: Headache Pain Intervention(s): Limited activity within patient's tolerance, Premedicated before session  Home Living                                          Prior Functioning/Environment              Frequency  Min 2X/week        Progress Toward Goals  OT Goals(current goals can now be found in the care plan section)  Progress towards OT goals: Progressing toward goals  Acute Rehab OT Goals Patient Stated Goal: pt wanting to get better adn go home OT Goal Formulation: With patient Time For Goal Achievement: 10/08/23 Potential to Achieve Goals: Good ADL Goals Pt Will Perform Grooming: with modified independence;standing Pt Will Perform Lower Body Bathing: with modified independence;sit to/from stand;sitting/lateral leans Pt Will Perform Lower Body Dressing: with modified independence;sit to/from stand;sitting/lateral leans Pt Will Transfer to Toilet: with modified independence;ambulating;regular height toilet Pt Will Perform Toileting - Clothing Manipulation and hygiene: with modified independence;sitting/lateral leans;sit to/from stand Additional ADL Goal #1: Patient will be able to complete functional task in standing for 5 minutes prior to fatigue in order to promote increased activity tolerance.  Plan      Co-evaluation    PT/OT/SLP Co-Evaluation/Treatment: Yes Reason for Co-Treatment: Other (comment) (to increase activity level)   OT goals addressed during session: ADL's and self-care      AM-PAC OT "6 Clicks" Daily Activity     Outcome Measure   Help from another person eating meals?: Total (NPO) Help from another person taking care of personal  grooming?: A Little Help from another person toileting, which includes using toliet, bedpan, or urinal?: A Little Help from another person bathing (including washing, rinsing, drying)?: A Lot Help from another person to put on and taking off regular upper body clothing?: A Little Help from another person to put on and taking off regular lower body clothing?: A Lot 6 Click Score: 14    End of Session Equipment Utilized During Treatment: Gait belt;Rolling walker (2 wheels)  OT Visit Diagnosis: Muscle weakness (generalized) (M62.81) Pain - part of body:  (headache)   Activity Tolerance Patient tolerated treatment well   Patient Left in bed;with call bell/phone within reach;with bed alarm set   Nurse Communication Mobility status        Time: 1559-1630 OT Time Calculation (min): 31 min  Charges: OT General Charges $OT Visit: 1 Visit OT Treatments $Self Care/Home Management : 8-22 mins  Milburn Aliment, OT/L   Acute OT Clinical Specialist Acute Rehabilitation Services Pager [phone] Office [phone]   [facility] 10/06/2023, 4:58 PM

## 2023-10-06 NOTE — Progress Notes (Signed)
 Spoke to [place], [place] on phone in regards to glucose lab 702. Thinking erroneous due to TPN running. Order to redraw CMP. Also mentioned that patient states overall not feeling well and complaining of abdominal pain/bloating. PA stated to stop tube feed for now.

## 2023-10-07 LAB — GLUCOSE, CAPILLARY
Glucose-Capillary: 138 mg/dL — ABNORMAL HIGH (ref 70–99)
Glucose-Capillary: 140 mg/dL — ABNORMAL HIGH (ref 70–99)
Glucose-Capillary: 147 mg/dL — ABNORMAL HIGH (ref 70–99)
Glucose-Capillary: 149 mg/dL — ABNORMAL HIGH (ref 70–99)
Glucose-Capillary: 171 mg/dL — ABNORMAL HIGH (ref 70–99)
Glucose-Capillary: 190 mg/dL — ABNORMAL HIGH (ref 70–99)
Glucose-Capillary: 218 mg/dL — ABNORMAL HIGH (ref 70–99)
Glucose-Capillary: 249 mg/dL — ABNORMAL HIGH (ref 70–99)

## 2023-10-07 LAB — BASIC METABOLIC PANEL WITH GFR
Anion gap: 3 — ABNORMAL LOW (ref 5–15)
BUN: 53 mg/dL — ABNORMAL HIGH (ref 8–23)
CO2: 21 mmol/L — ABNORMAL LOW (ref 22–32)
Calcium: 8.1 mg/dL — ABNORMAL LOW (ref 8.9–10.3)
Chloride: 114 mmol/L — ABNORMAL HIGH (ref 98–111)
Creatinine, Ser: 1.53 mg/dL — ABNORMAL HIGH (ref 0.61–1.24)
GFR, Estimated: 50 mL/min — ABNORMAL LOW (ref >60)
Glucose, Bld: 141 mg/dL — ABNORMAL HIGH (ref 70–99)
Potassium: 3.3 mmol/L — ABNORMAL LOW (ref 3.5–5.1)
Sodium: 138 mmol/L (ref 135–145)

## 2023-10-07 LAB — PHOSPHORUS: Phosphorus: 3.3 mg/dL (ref 2.5–4.6)

## 2023-10-07 LAB — MAGNESIUM: Magnesium: 1.8 mg/dL (ref 1.7–2.4)

## 2023-10-07 MED ORDER — VIVONEX RTF PO LIQD
ORAL | Status: DC
  Filled 2023-10-07 (×9): qty 1000

## 2023-10-07 MED ORDER — TRAVASOL 10 % IV SOLN
INTRAVENOUS | Status: AC
  Filled 2023-10-07: qty 1296

## 2023-10-07 MED ORDER — NON FORMULARY: 1080.0000 mL | Status: DC

## 2023-10-07 MED ORDER — POTASSIUM CHLORIDE 20 MEQ PO PACK
40.0000 meq | PACK | Freq: Once | ORAL | Status: AC
Start: 2023-10-07 — End: 2023-10-07
  Administered 2023-10-07: 40 meq
  Filled 2023-10-07: qty 2

## 2023-10-07 MED ORDER — MAGNESIUM SULFATE 2 GM/50ML IV SOLN
2.0000 g | Freq: Once | INTRAVENOUS | Status: AC
  Administered 2023-10-07: 2 g via INTRAVENOUS
  Filled 2023-10-07: qty 50

## 2023-10-07 NOTE — Progress Notes (Signed)
 Nutrition Follow-up  DOCUMENTATION CODES:   Severe malnutrition in context of chronic illness  INTERVENTION:  Mofify TF via J-tube: Vivonex at 45ml/hr (1080ml per day) Initiate at 25ml/hr and increase by 10ml/hr q12 hours until goal rate achieved  TF at goal rate provides: 1080 kcals (49% of estimated minimum needs), 54g protein (47% of minimum estimated needs) and 916 ml per day Initiate supplemental TPN to meet rest of estimated needs To be managed by pharmacy     Continue MVI with minerals daily  Continue nutrisource fiber BID to provide 3g soluble fiber per serving to aid in bulking of stool, if indicated Monitor for diet advancement and assess tolerance New weight collection to assess trend   NUTRITION DIAGNOSIS:  Severe Malnutrition related to chronic illness (esophageal cancer s/p chemo, radiation and surgery) as evidenced by severe muscle depletion, severe fat depletion, percent weight loss. - remains applicable  GOAL:  Patient will meet greater than or equal to 90% of their needs - not meeting  MONITOR:  Diet advancement, TF tolerance, Weight trends, Labs, PO intake  REASON FOR ASSESSMENT:  Consult Enteral/tube feeding initiation and management  ASSESSMENT:  68 y.o Male with multiple readmissions due to complications after esophagectomy and J tube placement on 3/10 including, anastomosis leaks, dysphagia, chylothorax, FTT. Now presents with persistent coughing episodes, SOB, J tube drainage, esophageal anastomotic leak. PMH of DM, HTN, dyslipedemia, CKD, esophageal cancer, IgG kappa MGUS.  Previous Admission 07/28/2023-08/19/2023 3/10 EGD, XI Robotic Assisted Ivor Lewis Esophagectomy, J-tube placement 3/11 Trickle TF initiated 3/12 Surgery increased TF to 40 ml/hr 3/13 Surgery increased TF to 55 ml/hr, transferred to ICU  3/14 Surgery increased TF to 65 ml/hr (goal), bowel regimen added and +BM 3/15 Intubated, Bronch (Thick mucus in trachea suctioned, partially  occluding L mainstem, ?aspiration, recurrent false vocal cord irritation with swelling leading to stridor and resp failure) 3/17 Extubated 3/18 Esophogram:  Brisk tracheal aspiration of ingested contrast, into the LEFT mainstem bronchus. SLP eval recommended. Stagnant contrast at diaphragmatic hiatus. No anastomotic leak.  3/19 Diet advanced to Dysphagia 2/Thins, later downgraded to Nectar Thick Liquids 3/20 Transition to Nocturnal TF, family to bring protein shake in from home 3/21 Significant diarrhea overnight, (9 stools in 10 hours), significant J-tube drainage, change to Vivonex with day time feedings 3/24 Diet changed to Dysphagia 2, Thins per SLP 3/26 NPO due to concern for possible esophageal leak 3/27 advanced to Dysphagia 3, thin liquids  3/31 modify TF regimen for d/c 4/1 discharged home Previous Admission 08/19/2023-09/15/2023 4/2 admitted; OR: R VATS, EGD, and esophageal stent placement 4/3 trial Johny Nap TF formula during the day (16 hours), transfusion 4/4 advanced to goal rate; leaking J-tube, TF held  4/5 txr to cardiovascular ICU 4/7 TF re-initiated, transfused  4/9 J-tube replaced by IR 4/10 significant leaking from J-tube; TFs on hold  4/11 PICC placed, TPN initiated, J-tube replaced again, WOUND VAC 4/12 re-initiate trickles s/p second J-tube replacement 4/13 PICC line replaced 4/14 slowly advance TF goal rate 4/16 TF to goal rate; TPN discontinued 4/17 chylothorax: transition to Vivonex and very low fat diet 4/19 TF reduced due to feelings of fullness/diarrhea 4/21 TF rate increased to 75ml/hr 4/24 embolization of chyle leak 4/25 TF increased to 56ml/hr 4/28 discharged home Current Admission 5/2 Admitted Viovonex started @ 65 ml/hr, J tube drainage and chylothorax persists 5/5 EGD w/ esophageal stent replacement 5/6 transfused 5/8  bronchoscopy: no acute findings; evidence of aspiration 5/9 transitioned to [facility] 1.4 5/15 5 loose stools  x24 hours 5/16  transition to [facility] 1.5 Peptide and supplemental TPN  5/18 TF on hold d/t abdominal pain and discomfort 5/19 TF d/c and TPN to meet 100% of needs 5/20 Re-start Vivonex + TPN (50/50)  CT output continues to improve with 20ml output documented yesterday. Spoke with surgery PA-C this morning who would like to start tube feedings back to meet 50% of estimated needs and use supplemental TPN to meet the other 50%. Will re-initiate Vivonex, as this the only formula patient can tolerate at this point. He will need to meet goal rate of 20ml/hr to sufficiently meet needs. Will monitor for presence of diarrhea and increased J-tube output.    Pharmacy to adjust TPN regimen to be started tomorrow. Spoke with pharmacy about ensuring adequate fat content as Vivonex is low fat formula. Will order and start Vivonex at low rate today and slowly titrate up. Surgery stating patient to discharge on this regimen.  Recommend exploring etiology of GI concerns, as patient has previously tolerated this formula and any of his previous formulas at lower rates. While diarrhea has improved with discontinuation of tube feedings, abdominal pain has not.   Admit Weight: 73.9kg Current Weight: 70kg   Remains NPO. Weight trending down as he continues to not sufficiently meet his estimated needs 2/2 holding TF r/t intolerance and malabsorption.    Intake/Output Summary (Last 24 hours) at 10/07/2023 1327 Last data filed at 10/07/2023 1200 Gross per 24 hour  Intake 2819 ml  Output 1595 ml  Net 1224 ml      Drains/Lines: LUQ: Jejunostomy (20 Fr) R Lateral Pleural (19Fr): 20ml x24 hours UOP:  x24 hours   Meds: famotidine , folic acid ,  SSI 0-9 q4 hours, Semglee  18 BID,  lactulose , MVI, thiamine , IV ABX   Labs:  Na+ 145 (wdl) K+ 4.0 (wdl) CBGs 130-136 x24 hours A1c 7.1 (07/2023)   Diet Order:   Diet Order             Diet NPO time specified  Diet effective now            EDUCATION NEEDS:  Education  needs have been addressed  Skin:  Skin Assessment: Skin Integrity Issues: Incisions: R chest tube Other: MASD around J-tube  Last BM:  5/18 - type 7 x3  Height:  Ht Readings from Last 1 Encounters:  09/25/23 5\' 10"  (1.778 m)   Weight:  Wt Readings from Last 1 Encounters:  10/07/23 68.6 kg   Ideal Body Weight:  75.5 kg  BMI:  Body mass index is 21.7 kg/m.  Estimated Nutritional Needs:   Kcal:  2200-2400 kcal  Protein:  115-130 gm  Fluid:  >2L/day  Con Decant MS, RD, LDN Registered Dietitian Clinical Nutrition RD Inpatient Contact Info in Amion

## 2023-10-07 NOTE — Progress Notes (Addendum)
 PHARMACY - TOTAL PARENTERAL NUTRITION CONSULT NOTE   Indication: intolerance of TF   Patient Measurements: Height: 5\' 10"  (177.8 cm) Weight: 68.6 kg (151 lb 3.8 oz) IBW/kg (Calculated) : 73 TPN AdjBW (KG): 73.9 Body mass index is 21.7 kg/m. 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 He then had a R VATS with decortication, drainage and esophageal stent 08/20/23. He had multiple J tube exchanges that decreased leakage. 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 He was discharged 4/28 but then readmitted 5/2 with failure to thrive, increased J tube leakage, poor po intake, and milky output from chest tube. Seth Burch TF was restarted 5/9 and was tolerating but started developing abdominal pressure/cramping and loose stools around 5/12. TF was turned off 5/13 PM per patient request. During this time chest tube output continued to be milky white but slowed. 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 Per patient, abdomen feels "terrible" with a lot of bloating and pressure. Patient having a lot of flatus and loose stools with a light tan color close to the color of his tube feeds. Pharmacy consulted for TPN to supplement TF. Per IV team, ok to use port for TPN.  Glucose/Insulin : BG 141-176, A1C 7.1, used 8 units SSI/24hr / 25 units in TPN Electrolytes: Na 138, Cl 114, K 3.3 (40 mEq x1 ordered), Ca 8.0 [CoCa 9.78], Phos 3.3, mag 1.8 (2 g x1 ordered)  Renal: Scr 1.53 (BL 1.3), BUN 53 Hepatic: alk phos 137, others wnl, albumin  1.8, TG 446 5/19 Intake / Output; MIVF: TF [place] 1.5 cal/mL off since 5/19 AM, FW q4h added; UOP 1.3 L (0.8 mL/kg/h), Chest tube 0 mL, Drain 20 mL, LBM 5/18 [3  unmeasured] GI Imaging: 5/9 KUB: nonobstructive bowel gas pattern GI Surgeries / Procedures:  5/5 EGD stent removal, inspect lumen, new stent placed   Central access: ok to use Port per IV team, double lumen PICC 5/16  TPN start date: 5/16   Nutritional Goals: Goal TPN rate is 90 mL/hr (provides 130 g of protein and 2290 kcals per day)  RD Assessment: Total Estimated Needs Total Energy Estimated Needs: 2200-2400 kcal Total Protein Estimated Needs: 115-130 gm Total Fluid Estimated Needs: >2L/day  Current Nutrition:  TPN  Plan:  Increase TPN to 90 mL/hr at 1800, meeting 100% of TPN goals  Electrolytes in TPN: Na 30 mEq/L, K 30 mEq/L, Ca 0 mEq/L, Mg 5 mEq/L, and Phos 12 mmol/L, max acetate  No standard MVI and trace elements to TPN since receiving per tube and in TF  Continue Sensitive q4h SSI  Glargine stopped 5/17 RFP and mag tomorrow 5/21 given TPN titration / supplementation given today  Increase to 30 units insulin  to TPN given increased rate of TPN today, titrate per response  Monitor TPN labs on Mon/Thurs, and PRN  Thank you for involving pharmacy in this patient's care.  Addendum:  Spoke with RD, tube feeds (vivonex) to start today. Okay to continue FULL TPN for now and will likely advance TF tomorrow (5/21) so as to be able to provide 1/2 TPN and 1/2 tube feeds. Patient likely to need a combination of both TF and TPN at discharge if not able to tolerate full  TFPatience Burch, PharmD, BCPS, BCCCP Clinical Pharmacist

## 2023-10-07 NOTE — Progress Notes (Signed)
 Occupational Therapy Treatment Patient Details Name: Seth Burch MRN: [id number] DOB: 01/05/1956 Today's Date: 10/07/2023   History of present illness 68 y.o. male presents to [facility] 09/19/23 after follow-up with Dr.Lightfoot with failure to thrive, low O2 sats/SOB and milky-output of chest tube. Pt with chylothorax, esophageal anastomotic leak, and J tube bilious drainage. EGD on 5/5 with stent removal and inspection of esophageal lumen revealed persistent perforation. New esoph. stent placed. [name] Extensive prior admits 3/10-4/1 and 4/1-4/28. 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 He was able to complete bed mobility with supervision but then had a small BM in the bed and required hygiene. Pt was set up and started to complete hygiene with sit to stand with unilateral and no BUE support but fatigued and then required assist to finish task. Pt then requesting to go back into bed. At this time recommendation for [facility] to follow up.       If plan is discharge home, recommend the following:  A little help with walking and/or transfers;A little help with bathing/dressing/bathroom;Assistance with cooking/housework;Assist for transportation   Equipment Recommendations  None recommended by OT    Recommendations for Other Services      Precautions / Restrictions Precautions Precautions: Fall Recall of Precautions/Restrictions: Intact Precaution/Restrictions Comments: L UQ jejunostomy, Rt JP drain Restrictions Weight Bearing Restrictions Per Provider Order: No       Mobility Bed Mobility Overal bed mobility: Needs Assistance Bed Mobility: Supine to Sit, Sit to Supine Rolling: Supervision Sidelying to sit: Supervision Supine to sit: Supervision Sit to supine: Supervision Sit to sidelying: Supervision      Transfers Overall  transfer level: Needs assistance Equipment used: Rolling walker (2 wheels) Transfers: Sit to/from Stand Sit to Stand: Contact guard assist                 Balance Overall balance assessment: Needs assistance Sitting-balance support: Feet supported, Bilateral upper extremity supported Sitting balance-Leahy Scale: Fair     Standing balance support: Bilateral upper extremity supported, No upper extremity supported Standing balance-Leahy Scale: Fair Standing balance comment: was able to work on peri care following bm                           ADL either performed or assessed with clinical judgement   ADL Overall ADL's : Needs assistance/impaired Eating/Feeding: NPO   Grooming: Set up   Upper Body Bathing: Set up;Sitting   Lower Body Bathing: Minimal assistance;Sit to/from stand;Sitting/lateral leans   Upper Body Dressing : Set up;Sitting   Lower Body Dressing: Minimal assistance;Sit to/from stand;Sitting/lateral leans   Toilet Transfer: Contact guard assist   Toileting- Clothing Manipulation and Hygiene: Moderate assistance Toileting - Clothing Manipulation Details (indicate cue type and reason): pt fatigued and ended up needing assist     Functional mobility during ADLs: Contact guard assist      Extremity/Trunk Assessment Upper Extremity Assessment Upper Extremity Assessment: Generalized weakness   Lower Extremity Assessment Lower Extremity Assessment: Defer to PT evaluation        Vision   Vision Assessment?: No apparent visual deficits   Perception Perception Perception: Not tested   Praxis Praxis Praxis: Not tested   Communication Communication Communication: No apparent difficulties   Cognition Arousal: Alert Behavior During Therapy: Flat affect  Attention impairment (select first level of impairment): Divided attention Executive functioning impairment (select all impairments): Problem solving OT - Cognition Comments:  Pt needs increase time to sequence                 Following commands: Intact Following commands impaired: Follows one step commands inconsistently, Follows one step commands with increased time      Cueing   Cueing Techniques: Verbal cues  Exercises      Shoulder Instructions       General Comments      Pertinent Vitals/ Pain       Pain Assessment Pain Assessment: Faces Faces Pain Scale: Hurts little more Breathing: normal Pain Location: general aching Pain Descriptors / Indicators: Aching Pain Intervention(s): Limited activity within patient's tolerance  Home Living                                          Prior Functioning/Environment              Frequency  Min 2X/week        Progress Toward Goals  OT Goals(current goals can now be found in the care plan section)  Progress towards OT goals: Progressing toward goals  Acute Rehab OT Goals Patient Stated Goal: to go home OT Goal Formulation: With patient Time For Goal Achievement: 10/21/23 Potential to Achieve Goals: Good ADL Goals Pt Will Perform Grooming: with modified independence;standing Pt Will Perform Lower Body Bathing: with modified independence;sit to/from stand;sitting/lateral leans Pt Will Perform Lower Body Dressing: with modified independence;sit to/from stand;sitting/lateral leans Pt Will Transfer to Toilet: with modified independence;ambulating;regular height toilet Pt Will Perform Toileting - Clothing Manipulation and hygiene: with modified independence;sitting/lateral leans;sit to/from stand Additional ADL Goal #1: Patient will be able to complete functional task in standing for 5 minutes prior to fatigue in order to promote increased activity tolerance.  Plan      Co-evaluation                 AM-PAC OT "6 Clicks" Daily Activity     Outcome Measure   Help from another person eating meals?: Total (NPO) Help from another person taking care of  personal grooming?: A Little Help from another person toileting, which includes using toliet, bedpan, or urinal?: A Little Help from another person bathing (including washing, rinsing, drying)?: A Lot Help from another person to put on and taking off regular upper body clothing?: A Little Help from another person to put on and taking off regular lower body clothing?: A Lot 6 Click Score: 14    End of Session Equipment Utilized During Treatment: Gait belt;Rolling walker (2 wheels)  OT Visit Diagnosis: Muscle weakness (generalized) (M62.81) Pain - part of body:  (general aching)   Activity Tolerance Patient tolerated treatment well   Patient Left in bed;with call bell/phone within reach;with family/visitor present   Nurse Communication Mobility status        Time: 1120-1203 OT Time Calculation (min): 43 min  Charges: OT General Charges $OT Visit: 1 Visit OT Treatments $Self Care/Home Management : 38-52 mins  Erving Heather OTR/L  Acute Rehab Services  [phone] office number   Stevphen Elders 10/07/2023, 12:15 PM

## 2023-10-07 NOTE — Plan of Care (Signed)
  Problem: Education: Goal: Knowledge of General Education information will improve Description: Including pain rating scale, medication(s)/side effects and non-pharmacologic comfort measures Outcome: Progressing   Problem: Health Behavior/Discharge Planning: Goal: Ability to manage health-related needs will improve Outcome: Progressing   Problem: Clinical Measurements: Goal: Ability to maintain clinical measurements within normal limits will improve Outcome: Progressing Goal: Will remain free from infection Outcome: Progressing   Problem: Activity: Goal: Risk for activity intolerance will decrease Outcome: Progressing   Problem: Nutrition: Goal: Adequate nutrition will be maintained Outcome: Progressing   Problem: Pain Managment: Goal: General experience of comfort will improve and/or be controlled Outcome: Progressing   Problem: Skin Integrity: Goal: Risk for impaired skin integrity will decrease Outcome: Progressing

## 2023-10-07 NOTE — Progress Notes (Addendum)
      [address]       [place] [address]             [phone]      12 Days Post-Op Procedure(s) (LRB): BRONCHOSCOPY, FLEXIBLE (N/A)  Subjective:  Patient stable.  Diarrhea much improved.  No N/V.  Abdominal pain  Objective: Vital signs in last 24 hours: Temp:  [97.4 F (36.3 C)-98.1 F (36.7 C)] 97.4 F (36.3 C) (05/20 0405) Pulse Rate:  [67-77] 68 (05/20 0405) Cardiac Rhythm: Normal sinus rhythm (05/19 1900) Resp:  [20-28] 20 (05/20 0405) BP: (108-147)/(67-84) 116/71 (05/20 0405) SpO2:  [95 %-98 %] 97 % (05/20 0405) Weight:  [68.6 kg-71 kg] 68.6 kg (05/20 0500)  Intake/Output from previous day: 05/19 0701 - 05/20 0700 In: 969 [I.V.:819] Out: 1345 [Urine:1325; Drains:20] Intake/Output this shift: Total I/O In: -  Out: 200 [Urine:200]  General appearance: alert, cooperative, and no distress Heart: regular rate and rhythm Lungs: diminished breath sounds bibasilar Abdomen: soft, non-tender; bowel sounds normal; no masses,  no organomegaly Extremities: extremities normal, atraumatic, no cyanosis or edema Wound: drainage much improved around J Tube  Lab Results: Recent Labs    10/04/23 0955 10/06/23 0543  WBC 11.7* 12.8*  HGB 8.9* 8.9*  HCT 29.2* 28.7*  PLT 387 390   BMET:  Recent Labs    10/06/23 0944 10/07/23 0500  NA 141 138  K 4.0 3.3*  CL 116* 114*  CO2 19* 21*  GLUCOSE 151* 141*  BUN 50* 53*  CREATININE 1.52* 1.53*  CALCIUM  8.3* 8.1*    PT/INR: No results for input(s): "LABPROT", "INR" in the last 72 hours. ABG    Component Value Date/Time   PHART 7.4 07/29/2023 0514   HCO3 25.8 08/02/2023 2006   TCO2 17 (L) 08/19/2023 2022   ACIDBASEDEF 1.0 08/02/2023 1506   O2SAT 94.5 08/02/2023 2006   CBG (last 3)  Recent Labs    10/06/23 2356 10/07/23 0404 10/07/23 0624  GLUCAP 140* 147* 138*    Assessment/Plan: S/P Procedure(s) (LRB): BRONCHOSCOPY, FLEXIBLE (N/A)  CV- NSR- BP is stable- Norvasc , Lopressor  Pulm- no  acute issues, not requiring oxygen,- JP drain in place 20 cc output recorded Renal- AKI on CKD Stage3, resolving creatinine down to 1.53 ID- afebrile, sputum culture grew Staph Aureus GI- Malnutrition- tube feedings discontinued due to excessive diarrhea felt to be related to malnutrition.. continue TPN for nutrition DM- sugars are well controlled, continue current care   LOS: 18 days    [name], PA-C 10/07/2023  Agree Will adjutst tube feeds.  Pt will go home on mix of TPN and tube feeds  Donyale Berthold O Jesika Men

## 2023-10-08 ENCOUNTER — Inpatient Hospital Stay (HOSPITAL_COMMUNITY)

## 2023-10-08 LAB — RENAL FUNCTION PANEL
Albumin: 1.7 g/dL — ABNORMAL LOW (ref 3.5–5.0)
Anion gap: 6 (ref 5–15)
BUN: 58 mg/dL — ABNORMAL HIGH (ref 8–23)
CO2: 17 mmol/L — ABNORMAL LOW (ref 22–32)
Calcium: 7.7 mg/dL — ABNORMAL LOW (ref 8.9–10.3)
Chloride: 111 mmol/L (ref 98–111)
Creatinine, Ser: 1.4 mg/dL — ABNORMAL HIGH (ref 0.61–1.24)
GFR, Estimated: 55 mL/min — ABNORMAL LOW (ref >60)
Glucose, Bld: 322 mg/dL — ABNORMAL HIGH (ref 70–99)
Phosphorus: 2.4 mg/dL — ABNORMAL LOW (ref 2.5–4.6)
Potassium: 3.8 mmol/L (ref 3.5–5.1)
Sodium: 134 mmol/L — ABNORMAL LOW (ref 135–145)

## 2023-10-08 LAB — GLUCOSE, CAPILLARY
Glucose-Capillary: 305 mg/dL — ABNORMAL HIGH (ref 70–99)
Glucose-Capillary: 236 mg/dL — ABNORMAL HIGH (ref 70–99)
Glucose-Capillary: 214 mg/dL — ABNORMAL HIGH (ref 70–99)
Glucose-Capillary: 308 mg/dL — ABNORMAL HIGH (ref 70–99)
Glucose-Capillary: 216 mg/dL — ABNORMAL HIGH (ref 70–99)
Glucose-Capillary: 206 mg/dL — ABNORMAL HIGH (ref 70–99)

## 2023-10-08 LAB — MAGNESIUM: Magnesium: 2.2 mg/dL (ref 1.7–2.4)

## 2023-10-08 MED ORDER — SACCHAROMYCES BOULARDII 250 MG PO CAPS
250.0000 mg | ORAL_CAPSULE | Freq: Two times a day (BID) | ORAL | Status: DC
  Administered 2023-10-08 – 2023-10-13 (×9): 250 mg via ORAL
  Filled 2023-10-08 (×10): qty 1

## 2023-10-08 MED ORDER — INSULIN GLARGINE-YFGN 100 UNIT/ML ~~LOC~~ SOLN
10.0000 [IU] | Freq: Two times a day (BID) | SUBCUTANEOUS | Status: DC
  Filled 2023-10-08 (×2): qty 0.1

## 2023-10-08 MED ORDER — DOXAZOSIN MESYLATE 2 MG PO TABS
2.0000 mg | ORAL_TABLET | Freq: Every day | ORAL | Status: DC
  Administered 2023-10-08 – 2023-10-14 (×7): 2 mg
  Filled 2023-10-08 (×7): qty 1

## 2023-10-08 MED ORDER — TRAVASOL 10 % IV SOLN
INTRAVENOUS | Status: AC
  Filled 2023-10-08: qty 648

## 2023-10-08 MED ORDER — IOHEXOL 350 MG/ML SOLN
75.0000 mL | Freq: Once | INTRAVENOUS | Status: AC | PRN
  Administered 2023-10-08: 75 mL via INTRAVENOUS

## 2023-10-08 MED ORDER — SODIUM CHLORIDE 3 % IN NEBU
4.0000 mL | INHALATION_SOLUTION | Freq: Every day | RESPIRATORY_TRACT | Status: AC
  Filled 2023-10-08 (×3): qty 4

## 2023-10-08 MED ORDER — INSULIN ASPART 100 UNIT/ML IJ SOLN
3.0000 [IU] | INTRAMUSCULAR | Status: AC
  Administered 2023-10-08 (×3): 3 [IU] via SUBCUTANEOUS

## 2023-10-08 MED ORDER — SODIUM PHOSPHATES 45 MMOLE/15ML IV SOLN
10.0000 mmol | Freq: Once | INTRAVENOUS | Status: AC
  Administered 2023-10-08: 10 mmol via INTRAVENOUS
  Filled 2023-10-08: qty 3.33

## 2023-10-08 NOTE — Progress Notes (Signed)
      [address]       [place] [address]             [phone]        Contacted via nurse about Mr. Goetting.  His wife wanted to speak with me regarding plan.  Notified of new feeding goals and this will be plan when discharge is appropriate as long as he can tolerate w/o signficant diarrhea.   He has not developed current diarrhea with Vivonex.  She states we are going round and circles with this nutrition and something is not right.  She requests a CT scan of chest and abdomen be done.   I have ordered KUB in meantime, discussed with Dr. Deloise Ferries.  [name], PA-C 11:42 AM 10/08/23

## 2023-10-08 NOTE — Progress Notes (Signed)
 Patient inquiring about his flomax . Stated that, "he hasn't had it and thought maybe that he was being given a substitute to help him urinate".  Patient stated that, "he is having trouble urinating and only puts out 200 ml or so at a time". Not sure if there is something that will help and is able to go down the tube.  Thanks, nursing

## 2023-10-08 NOTE — Progress Notes (Signed)
 PT Cancellation Note  Patient Details Name: THORNE WIRZ MRN: [id number] DOB: 1955-12-04   Cancelled Treatment:    Reason Eval/Treat Not Completed: (P) Other (comment), pt declining due to continued loose stools and fatigue, asking this PTA to return in AM. Will check back as schedule allows to continue with PT POC.  Beverly Buckler. PTA Acute Rehabilitation Services Office: [phone]   [name] 10/08/2023, 2:56 PM

## 2023-10-08 NOTE — Progress Notes (Signed)
 PHARMACY - TOTAL PARENTERAL NUTRITION CONSULT NOTE   Indication: intolerance of TF   Patient Measurements: Height: 5\' 10"  (177.8 cm) Weight: 70.4 kg (155 lb 3.3 oz) IBW/kg (Calculated) : 73 TPN AdjBW (KG): 73.9 Body mass index is 22.27 kg/m. 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 He then had a R VATS with decortication, drainage and esophageal stent 08/20/23. He had multiple J tube exchanges that decreased leakage. 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 He was discharged 4/28 but then readmitted 5/2 with failure to thrive, increased J tube leakage, poor po intake, and milky output from chest tube. Johny Nap TF was restarted 5/9 and was tolerating but started developing abdominal pressure/cramping and loose stools around 5/12. TF was turned off 5/13 PM per patient request. During this time chest tube output continued to be milky white but slowed. 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 Per patient, abdomen feels "terrible" with a lot of bloating and pressure. Patient having a lot of flatus and loose stools with a light tan color close to the color of his tube feeds. Pharmacy consulted for TPN to supplement TF. Per IV team, ok to use port for TPN.  Glucose/Insulin : BG 149-322 likely due to full TPN started 5/20 PM, A1C 7.1, used 18 units SSI/24hr / 30 units in TPN Electrolytes: Na 134, Cl 111, CO2 17, K 3.8 (s/p 40 mEq), CoCa 9.5, Phos 2.4, Mg 2.2  Renal: SCr 1.4 (BL 1.3), BUN 58 Hepatic: alk phos 137, others wnl, albumin  1.8, TG 446 5/19 Intake / Output; MIVF: TF Vivonex RTF, FW 150ml q4h added; UOP 2.1 L (1.3 mL/kg/h), JP Drain 63 mL, LBM 5/20 [1 unmeasured] GI Imaging: 5/9 KUB:  nonobstructive bowel gas pattern GI Surgeries / Procedures:  5/5 EGD stent removal, inspect lumen, new stent placed   Central access: ok to use Port per IV team, double lumen PICC 5/16  TPN start date: 5/16   Nutritional Goals: Goal TPN rate is 90 mL/hr (provides 130 g of protein and 2290 kcals per day)  RD Assessment: Total Estimated Needs Total Energy Estimated Needs: 2200-2400 kcal Total Protein Estimated Needs: 115-130 gm Total Fluid Estimated Needs: >2L/day  Current Nutrition:  TPN and Tube feeding 5/21 Vivonex RTF at 35 ml/hr  Plan:  TF at goal + 1/2 TPN meeting 100% needs Decrease TPN to 45 mL/hr at 1800, meeting 50% of needs Electrolytes in TPN: Na 30 mEq/L, K 35 mEq/L, Ca 0 mEq/L, Mg 5 mEq/L, and Phos 12 mmol/L, max acetate  No standard MVI and trace elements to TPN since receiving per tube and in TF  NaPhos 10 mmol IV x1 Continue Sensitive q4h SSI Add 3 units TF coverage until new TPN hung tonight Glargine stopped 5/17 Continue 30 units insulin  to TPN, titrate per response  Monitor TPN labs on Mon/Thurs, and PRN  Thank you for involving pharmacy in this patient's care.  Caroline Cinnamon, PharmD, BCPS Clinical Pharmacist Clinical phone for 10/08/2023 is [phone] 10/08/2023 7:11 AM

## 2023-10-08 NOTE — Plan of Care (Signed)
   Problem: Clinical Measurements: Goal: Respiratory complications will improve Outcome: Progressing Goal: Cardiovascular complication will be avoided Outcome: Progressing

## 2023-10-08 NOTE — Progress Notes (Signed)
 Nutrition Brief Note  Received secure chat from RN stating patient's wife would like to speak to RD regarding tube feeding orders. Visited patient and his wife at bedside. Patient wife asking the plan for discharge and what tube feeding he would be on as they are currently in process of trying to send Vivonex back to home health company.  Advised that, per surgery, tentative plan is to receive 50% of estimated needs from TPN and 50% of his estimated needs from enteral nutrition via Vivonex as this is the only formula he has been able to tolerate at a reduced rate. Will add Florastor as looser stools may be attributable to abx therapy and loss of good gut bacteria.   Patient states he has had one BM today that was loose. Wife reports they are loose, but more formed than previous baseline. TF running at 51ml/hr at time of bedside visit with goal rate of 100ml/hr. Encouraged patient and family to pass along any s/sx of intolerance. Continues to cough up green sputum. Wife also verbalizes concern around his continued abdominal pain despite stoppage of TF administration. Requesting imaging and are apprehensive to discharge without resolution of this issue.   Estimated Nutritional Needs:  Kcal:  2200-2400 kcal Protein:  115-130 gm Fluid:  >2L/day   INTERVENTION:  Mofify TF via J-tube: Vivonex at 45ml/hr (1080ml per day) Initiate at 25ml/hr and increase by 10ml/hr q12 hours until goal rate achieved   TF at goal rate provides: 1080 kcals (49% of estimated minimum needs), 54g protein (47% of minimum estimated needs) and 916 ml per day  Initiate supplemental TPN to meet rest of estimated needs To be managed by pharmacy   TPN at goal rate provides: 1145 kcals (52% of minimum estimated needs) and 65g protein (57% of minimum estimated needs)  TOTAL NUTRIENT INTAKE: 2225 kcals and 119g protein  Continue MVI with minerals daily  Continue nutrisource fiber BID to provide 3g soluble fiber per serving to  aid in bulking of stool, if indicated Monitor for diet advancement and assess tolerance Add Florastor for gut health and to promote repletion of gut bacteria   NUTRITION DIAGNOSIS:  Severe Malnutrition related to chronic illness (esophageal cancer s/p chemo, radiation and surgery) as evidenced by severe muscle depletion, severe fat depletion, percent weight loss. - remains applicable   GOAL:  Patient will meet greater than or equal to 90% of their needs - progressing   MONITOR:  Diet advancement, TF tolerance, Weight trends, Labs, PO intake  Con Decant MS, RD, LDN Registered Dietitian Clinical Nutrition RD Inpatient Contact Info in Amion

## 2023-10-08 NOTE — Progress Notes (Addendum)
      [address]       [name] [address]             [phone]      13 Days Post-Op Procedure(s) (LRB): BRONCHOSCOPY, FLEXIBLE (N/A)  Subjective:  Patient states he is having difficulty urinating.  He states he doesn't think he has been receiving his flomax .  He is also having continued increase sputum production that is getting thick again and difficult to expectorate.   Objective: Vital signs in last 24 hours: Temp:  [97.5 F (36.4 C)-97.9 F (36.6 C)] 97.7 F (36.5 C) (05/21 0355) Pulse Rate:  [67-80] 76 (05/21 0355) Cardiac Rhythm: Normal sinus rhythm (05/20 2200) Resp:  [20-32] 20 (05/21 0355) BP: (105-124)/(67-77) 115/68 (05/21 0355) SpO2:  [96 %-98 %] 98 % (05/21 0355) Weight:  [70.4 kg] 70.4 kg (05/21 0355)  Intake/Output from previous day: 05/20 0701 - 05/21 0700 In: 2453.7 [I.V.:1196.6; NG/GT:907.1; IV Piggyback:100] Out: 2188 [Urine:2125; Drains:63]  General appearance: alert, cooperative, and no distress Heart: regular rate and rhythm Lungs: diminished breath sounds bibasilar Abdomen: soft, mild distention, tender in suprapublic area Extremities: extremities normal, atraumatic, no cyanosis or edema Wound: clean and dry, JP drain with low amount of milky drainage  Lab Results: Recent Labs    10/06/23 0543  WBC 12.8*  HGB 8.9*  HCT 28.7*  PLT 390   BMET:  Recent Labs    10/07/23 0500 10/08/23 0415  NA 138 134*  K 3.3* 3.8  CL 114* 111  CO2 21* 17*  GLUCOSE 141* 322*  BUN 53* 58*  CREATININE 1.53* 1.40*  CALCIUM  8.1* 7.7*    PT/INR: No results for input(s): "LABPROT", "INR" in the last 72 hours. ABG    Component Value Date/Time   PHART 7.4 07/29/2023 0514   HCO3 25.8 08/02/2023 2006   TCO2 17 (L) 08/19/2023 2022   ACIDBASEDEF 1.0 08/02/2023 1506   O2SAT 94.5 08/02/2023 2006   CBG (last 3)  Recent Labs    10/07/23 1955 10/07/23 2355 10/08/23 0354  GLUCAP 171* 249* 305*    Assessment/Plan: S/P Procedure(s)  (LRB): BRONCHOSCOPY, FLEXIBLE (N/A)  CV- NSR, BP is stable- continue Norvasc , Lopressor  Pulm- very thick sputum.. increasing with difficulty expectorating.. will try Hypertonic Saline to thin secretions.. JP drain in place with milky output remains low Renal- AKI on CKD improving.. creatinine stable at 1.53 Hypokalemia- resolved, K is at 3.8 GU- H/O BPH, mild urinary retention.. will have nurses bladder scan, ... looks like he has been on proscar  since 5/12, discussed with pharmacy unfortunately flomax  can not be crushed/nor can capsule be opened for adminstration.. will add cardura  at 2 mg to see if this helps ID- afebrile, persistent thick sputum expectoration.. culture grew Staph aureus..on Ancef  GI- NPO remains in place.. tube feeds resumed at reduced rate.[name] goal is 50% of caloric intake, 50% via TPN.[name] tolerating for now DM- sugars elevated with resumption of tube feedings...do not adjust insulin  regimen per Jenn with Pharmacy they are handling and adjusting insulin  dosing as needed in TPN  LOS: 19 days    [name], PA-C 10/08/2023  Not feeling well today Family has requested a scan Will obtain  Arcadia Gorgas O Amal Renbarger

## 2023-10-09 LAB — GLUCOSE, CAPILLARY
Glucose-Capillary: 185 mg/dL — ABNORMAL HIGH (ref 70–99)
Glucose-Capillary: 206 mg/dL — ABNORMAL HIGH (ref 70–99)
Glucose-Capillary: 218 mg/dL — ABNORMAL HIGH (ref 70–99)
Glucose-Capillary: 221 mg/dL — ABNORMAL HIGH (ref 70–99)
Glucose-Capillary: 242 mg/dL — ABNORMAL HIGH (ref 70–99)
Glucose-Capillary: 273 mg/dL — ABNORMAL HIGH (ref 70–99)
Glucose-Capillary: 308 mg/dL — ABNORMAL HIGH (ref 70–99)

## 2023-10-09 LAB — COMPREHENSIVE METABOLIC PANEL WITH GFR
ALT: 9 U/L (ref 0–44)
AST: 21 U/L (ref 15–41)
Albumin: 1.8 g/dL — ABNORMAL LOW (ref 3.5–5.0)
Alkaline Phosphatase: 160 U/L — ABNORMAL HIGH (ref 38–126)
Anion gap: 9 (ref 5–15)
BUN: 64 mg/dL — ABNORMAL HIGH (ref 8–23)
CO2: 18 mmol/L — ABNORMAL LOW (ref 22–32)
Calcium: 8 mg/dL — ABNORMAL LOW (ref 8.9–10.3)
Chloride: 106 mmol/L (ref 98–111)
Creatinine, Ser: 1.48 mg/dL — ABNORMAL HIGH (ref 0.61–1.24)
GFR, Estimated: 52 mL/min — ABNORMAL LOW (ref >60)
Glucose, Bld: 289 mg/dL — ABNORMAL HIGH (ref 70–99)
Potassium: 3.7 mmol/L (ref 3.5–5.1)
Sodium: 133 mmol/L — ABNORMAL LOW (ref 135–145)
Total Bilirubin: 0.3 mg/dL (ref 0.0–1.2)
Total Protein: 5.9 g/dL — ABNORMAL LOW (ref 6.5–8.1)

## 2023-10-09 LAB — MAGNESIUM: Magnesium: 2.2 mg/dL (ref 1.7–2.4)

## 2023-10-09 LAB — PHOSPHORUS: Phosphorus: 2.7 mg/dL (ref 2.5–4.6)

## 2023-10-09 MED ORDER — INSULIN GLARGINE-YFGN 100 UNIT/ML ~~LOC~~ SOLN
20.0000 [IU] | Freq: Every day | SUBCUTANEOUS | Status: DC
  Administered 2023-10-09 – 2023-10-10 (×2): 20 [IU] via SUBCUTANEOUS
  Filled 2023-10-09 (×3): qty 0.2

## 2023-10-09 MED ORDER — INSULIN ASPART 100 UNIT/ML IJ SOLN
0.0000 [IU] | INTRAMUSCULAR | Status: DC
  Administered 2023-10-09 – 2023-10-10 (×4): 5 [IU] via SUBCUTANEOUS
  Administered 2023-10-10 (×2): 3 [IU] via SUBCUTANEOUS

## 2023-10-09 MED ORDER — TRAVASOL 10 % IV SOLN
INTRAVENOUS | Status: AC
  Filled 2023-10-09: qty 660

## 2023-10-09 NOTE — Plan of Care (Signed)

## 2023-10-09 NOTE — Progress Notes (Signed)
 Physical Therapy Treatment Patient Details Name: Seth Burch MRN: [id number] DOB: 03/07/1956 Today's Date: 10/09/2023   History of Present Illness 68 y.o. male [facility] to [facility]19/23 after follow-up with Seth Burch with failure to thrive, low O2 sats/SOB and milky-output of chest tube. Pt with chylothorax, esophageal anastomotic leak, and J tube bilious drainage. EGD on 5/5 with stent removal and inspection of esophageal lumen revealed persistent perforation. New esoph. stent placed. Seth Burch Extensive prior admits 3/10-4/1 and 4/1-4/28. 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 Session focused on gait with rollator support for increased activity tolerance with pt able to progress gait distance. Pt benefiting from cues for slowed gait cadence and self pacing as well as rollator safety/sequencing with great carryover throughout session. Encouraged pt to elevate HOB as tolerated as pt with preference for resting <30 degrees, with pt agreeable to elevate after brief recovery from session. Pt continues to benefit from skilled PT services to progress toward functional mobility goals.     If plan is discharge home, recommend the following: Assistance with cooking/housework;Assist for transportation;Help with stairs or ramp for entrance;A little help with walking and/or transfers;A little help with bathing/dressing/bathroom   Can travel by private vehicle        Equipment Recommendations  None recommended by PT    Recommendations for Other Services       Precautions / Restrictions Precautions Precautions: Fall Recall of Precautions/Restrictions: Intact Precaution/Restrictions Comments: L UQ jejunostomy, Rt JP drain Restrictions Weight Bearing Restrictions Per Provider Order: No      Mobility  Bed Mobility Overal bed mobility: Needs Assistance Bed Mobility: Supine to Sit, Sit to Supine     Supine to sit: Supervision Sit to supine: Supervision Sit to sidelying: Supervision      Transfers Overall transfer level: Needs assistance Equipment used: Rollator (4 wheels) Transfers: Sit to/from Stand Sit to Stand: Contact guard assist           General transfer comment: CGA for safety, cues for rollaotr brake use with great carryover throughout session    Ambulation/Gait Ambulation/Gait assistance: Contact guard assist Gait Distance (Feet): 90 Feet (x2) Assistive device: Rollator (4 wheels) Gait Pattern/deviations: WFL(Within Functional Limits) Gait velocity: decr     General Gait Details: cues for slower gait cadence as pt with noted DOE, no LOB, x1 seated rest on rollator seat   Stairs             Wheelchair Mobility     Tilt Bed    Modified Rankin (Stroke Patients Only)       Balance Overall balance assessment: Needs assistance Sitting-balance support: Feet supported, Bilateral upper extremity supported Sitting balance-Leahy Scale: Fair     Standing balance support: Bilateral upper extremity supported, No upper extremity supported Standing balance-Leahy Scale: Fair                              Hotel manager: No apparent difficulties Factors Affecting Communication: Difficulty expressing self  Cognition Arousal: Alert Behavior During Therapy: Flat affect   PT - Cognitive impairments: No apparent impairments                       PT - Cognition Comments: mildly anxious with RR up  to 40s with activity Following commands: Intact Following commands impaired: Follows one step commands inconsistently, Follows one step commands with increased time    Cueing Cueing Techniques: Verbal cues  Exercises      General Comments General comments (skin integrity, edema, etc.): VSS on  RA, spouse present and supportive      Pertinent Vitals/Pain Pain Assessment Pain Assessment: Faces Faces Pain Scale: Hurts a little bit Pain Location: general aching Pain Descriptors / Indicators: Aching Pain Intervention(s): Monitored during session, Limited activity within patient's tolerance    Home Living                          Prior Function            PT Goals (current goals can now be found in the care plan section) Acute Rehab PT Goals Patient Stated Goal: to go home PT Goal Formulation: With patient/family Time For Goal Achievement: 10/20/23 Progress towards PT goals: Progressing toward goals    Frequency    Min 3X/week      PT Plan      Co-evaluation              AM-PAC PT "6 Clicks" Mobility   Outcome Measure  Help needed turning from your back to your side while in a flat bed without using bedrails?: A Little Help needed moving from lying on your back to sitting on the side of a flat bed without using bedrails?: A Little Help needed moving to and from a bed to a chair (including a wheelchair)?: A Little Help needed standing up from a chair using your arms (e.g., wheelchair or bedside chair)?: A Little Help needed to walk in hospital room?: A Little Help needed climbing 3-5 steps with a railing? : A Lot 6 Click Score: 17    End of Session   Activity Tolerance: Patient limited by fatigue;Patient tolerated treatment well Patient left: in bed;with call bell/phone within reach;with family/visitor present Nurse Communication: Mobility status PT Visit Diagnosis: Other abnormalities of gait and mobility (R26.89);Unsteadiness on feet (R26.81);Muscle weakness (generalized) (M62.81)     Time: 1018-1050 PT Time Calculation (min) (ACUTE ONLY): 32 min  Charges:    $Gait Training: 8-22 mins $Therapeutic Activity: 8-22 mins PT General Charges $$ ACUTE PT VISIT: 1 Visit                     Seth Burch Acute Rehabilitation  Services Office: [phone]   Seth Burch 10/09/2023, 11:45 AM

## 2023-10-09 NOTE — Progress Notes (Addendum)
 14 Days Post-Op Procedure(s) (LRB): BRONCHOSCOPY, FLEXIBLE (N/A) Subjective: States significant improvement in diarhhea, cough has worsened, remains productive  Objective: Vital signs in last 24 hours: Temp:  [97.6 F (36.4 C)-98.6 F (37 C)] 97.6 F (36.4 C) (05/22 0321) Pulse Rate:  [76-85] 79 (05/22 0321) Cardiac Rhythm: Normal sinus rhythm (05/21 1900) Resp:  [20-21] 20 (05/22 0321) BP: (96-117)/(55-73) 117/73 (05/22 0321) SpO2:  [95 %-100 %] 98 % (05/22 0321) Weight:  [70.3 kg] 70.3 kg (05/22 0321)  Hemodynamic parameters for last 24 hours:    Intake/Output from previous day: 05/21 0701 - 05/22 0700 In: 1687.6 [I.V.:440.6; NG/GT:1007.4; IV Piggyback:239.6] Out: 1770 [Urine:1730; Drains:40] Intake/Output this shift: No intake/output data recorded.  General appearance: alert, cooperative, and no distress Heart: regular rate and rhythm Lungs: mildly dim in bases Abdomen: soft, non tender or distended Extremities: no edema or calf tenderness Wound: incis well healed, J tube and other dressings CDI  Lab Results: No results for input(s): "WBC", "HGB", "HCT", "PLT" in the last 72 hours. BMET:  Recent Labs    10/08/23 0415 10/09/23 0648  NA 134* 133*  K 3.8 3.7  CL 111 106  CO2 17* 18*  GLUCOSE 322* 289*  BUN 58* 64*  CREATININE 1.40* 1.48*  CALCIUM  7.7* 8.0*    PT/INR: No results for input(s): "LABPROT", "INR" in the last 72 hours. ABG    Component Value Date/Time   PHART 7.4 07/29/2023 0514   HCO3 25.8 08/02/2023 2006   TCO2 17 (L) 08/19/2023 2022   ACIDBASEDEF 1.0 08/02/2023 1506   O2SAT 94.5 08/02/2023 2006   CBG (last 3)  Recent Labs    10/08/23 2035 10/08/23 2326 10/09/23 0319  GLUCAP 216* 206* 273*    Meds Scheduled Meds:  amLODipine   10 mg Per Tube Daily   atorvastatin   20 mg Per Tube Daily   Chlorhexidine  Gluconate Cloth  6 each Topical Daily   doxazosin   2 mg Per Tube Daily   enoxaparin  (LOVENOX ) injection  40 mg Subcutaneous Q24H    escitalopram   5 mg Per Tube Daily   famotidine   20 mg Per Tube Daily   feeding supplement (PROSource TF20)  60 mL Per Tube Daily   fiber  1 packet Per Tube BID   finasteride   5 mg Oral Daily   folic acid   1 mg Per Tube Daily   free water   150 mL Per Tube Q4H   Gerhardt's butt cream   Topical TID   insulin  aspart  0-9 Units Subcutaneous Q4H   metoprolol  tartrate  25 mg Per Tube BID   multivitamin with minerals  1 tablet Per Tube Daily   saccharomyces boulardii  250 mg Oral BID   simethicone   80 mg Per Tube QID   sodium chloride  flush  10-40 mL Intracatheter Q12H   sodium chloride  HYPERTONIC  4 mL Nebulization Daily   thiamine   100 mg Per Tube Daily   Continuous Infusions:   ceFAZolin  (ANCEF ) IV 2 g (10/09/23 0222)   TPN ADULT (ION) 45 mL/hr at 10/08/23 1800   Vivonex RTF 45 mL/hr at 10/09/23 0331   PRN Meds:.acetaminophen  **OR** acetaminophen , chlorpheniramine-HYDROcodone, liver oil-zinc  oxide, morphine  injection, ondansetron  **OR** ondansetron  (ZOFRAN ) IV, oxyCODONE , sodium chloride  flush  Xrays CT CHEST ABDOMEN PELVIS W CONTRAST Result Date: 10/08/2023 CLINICAL DATA:  Abdominal pain, malabsorption diarrhea, esophageal anastomotic leak EXAM: CT CHEST, ABDOMEN, AND PELVIS WITH CONTRAST TECHNIQUE: Multidetector CT imaging of the chest, abdomen and pelvis was performed following the standard protocol during bolus  administration of intravenous contrast. RADIATION DOSE REDUCTION: This exam was performed according to the departmental dose-optimization program which includes automated exposure control, adjustment of the mA and/or kV according to patient size and/or use of iterative reconstruction technique. CONTRAST:  75mL OMNIPAQUE  IOHEXOL  350 MG/ML SOLN COMPARISON:  08/19/2023 FINDINGS: CT CHEST FINDINGS Cardiovascular: No significant coronary artery calcification. Global cardiac size within normal limits. No pericardial effusion. Central pulmonary arteries are of normal caliber. Mild  atherosclerotic calcification within the thoracic aorta. No aortic aneurysm. Right internal jugular chest port tip noted within the superior right atrium. Left upper extremity PICC line has been placed with its tip within the superior right atrium. Mediastinum/Nodes: Visualized thyroid is unremarkable. No pathologic thoracic adenopathy. High density material and metallic coils are seen within the thoracic duct above the level of the aortic arch and there is high density material compatible with embolization glue extending within the more distal thoracic duct throughout its course extending into the superior retroperitoneum with dense embolization of the cisterna chyli. Interval placement of a esophageal stent graft extending from the level of the aortic arch through the distal neo esophagus and encompassing the area of anastomotic leak. There is, however, incomplete apposition of the proximal portion of the stents with the wall of the esophagus. A small amount of gas and debris is seen surrounding the stent graft and extending into the right pleural space, however, the volume debris has significantly decreased in the interval since prior examination. Lungs/Pleura: Interval right chest tube placement with evacuation of the multiloculated right pleural effusion. Minimal residual pleural fluid remains. There is pleural thickening and developing right basilar atelectasis/parenchymal scarring. There is interval development of subtle peribronchial and centrilobular nodularity and developing consolidation at the left lung base suspicious for changes of aspiration. Bronchopneumonia could appear similarly. Small left pleural effusion is unchanged. No pneumothorax. No central obstructing lesion. Musculoskeletal: No chest wall mass or suspicious bone lesions identified. CT ABDOMEN PELVIS FINDINGS Hepatobiliary: No focal liver abnormality is seen. No gallstones, gallbladder wall thickening, or biliary dilatation. Pancreas:  Unremarkable Spleen: Unremarkable Adrenals/Urinary Tract: Adrenal glands are unremarkable. Multiple simple cortical and parapelvic cysts are seen within the left kidney for which no follow-up imaging is recommended. The kidneys are otherwise unremarkable. Bladder is circumferentially thick walled suggesting changes of chronic bladder outlet obstruction. The bladder is not distended. Stomach/Bowel: Surgical changes esophagectomy and gastric pull-up are again identified. Percutaneous jejunostomy is seen within the left lower quadrant with accordion-like compression of the small bowel due to antegrade migration of the retaining balloon. No evidence of obstruction, however. Small volume fluid is seen within the rectal vault in keeping with a diarrheal state. The small and large bowel are otherwise unremarkable. Appendix normal. No free intraperitoneal gas or fluid. Vascular/Lymphatic: Aortic atherosclerosis. No enlarged abdominal or pelvic lymph nodes. Reproductive: Moderate prostatic hypertrophy Other: Residual lipiodol is seen within the inguinal regions bilaterally related to lymphangiography. As noted above, glue embolization of the retroperitoneal lymphatics is seen within the superior retroperitoneum extending into the cisterna chyli and thoracic. Musculoskeletal: No acute or significant osseous findings. IMPRESSION: 1. Interval embolization of the thoracic duct extending through the cisterna chyli and extending into multiple superior retroperitoneal lymphatic tributaries. 2. Interval placement of a esophageal stent graft extending from the aortic arch through the distal neo esophagus and encompassing the area of anastomotic leak. There is, however, incomplete apposition of the proximal portion of the stent graft with the wall of the esophagus. Small amount of debris and fluid is seen  surrounding the esophagus at the level of the anastomotic leak, however, this appears improved since prior examination. 3. Interval  development of subtle peribronchial and centrilobular nodularity and developing consolidation at the left lung base suspicious for changes of aspiration. Bronchopneumonia could appear similarly. Stable small left pleural effusion. 4. Interval evacuation of multiloculated right pleural effusion with chest tube placement. Small residual pleural fluid remains. Developing pleural thickening and basilar scarring in keeping some degree of developing trapped lung. 5. Percutaneous jejunostomy within the left lower quadrant with accordion-like compression of the small bowel due to antegrade migration of the retaining balloon. No evidence of obstruction. Revision of the jejunostomy catheter may be warranted. 6. Small volume fluid within the rectal vault in keeping with a diarrheal state. Aortic Atherosclerosis (ICD10-I70.0). Electronically Signed   By: Worthy Heads M.D.   On: 10/08/2023 23:10   DG Abd 1 View Result Date: 10/08/2023 CLINICAL DATA:  Abdominal pain. EXAM: ABDOMEN - 1 VIEW COMPARISON:  Radiograph 09/26/2023 FINDINGS: No bowel dilatation to suggest obstruction. Air within nondilated colon. No significant formed stool in the colon. Again seen catheter projecting over the left abdomen, reportedly a jejunostomy tube. Again seen contrast in the lymphatic system. IMPRESSION: No radiographic explanation for pain.  No obstruction. Electronically Signed   By: Chadwick Colonel M.D.   On: 10/08/2023 15:51    Assessment/Plan: S/P Procedure(s) (LRB): BRONCHOSCOPY, FLEXIBLE (N/A)  1 afeb, VSS, NSR 2 O2 good sats on RA 3 good UOP currently- has had issues w/ BPH retention 4 FEN- TPN and Vivonex- tolerating a bit better, decreased diarrhea, NPO, significant protein cal malnutrition 5 MD to review the CT scan/Xrays- results as noted above 6 mild hyponatremia- monitor 7 AKI on CKD, relatively stable  8 ID - remains on ancef  , thick sputum s aureus, likely component of aspiration 9 sugars being managed per  pharmacy /TPN dosing 10 push rehab abd pulm hygiene as able 11 drain- minor drainage, continue to keep in place and monitor   LOS: 20 days    Lindi Revering PA-C Pager [phone] 10/09/2023   Agree CT reviewed Nothing concerning Dispo planning  Nikkolas Coomes O Mixtli [place]

## 2023-10-09 NOTE — Progress Notes (Signed)
 PHARMACY - TOTAL PARENTERAL NUTRITION CONSULT NOTE  Indication: Intolerance to EN   Patient Measurements: Height: 5\' 10"  (177.8 cm) Weight: 70.3 kg (154 lb 15.7 oz) IBW/kg (Calculated) : 73 TPN AdjBW (KG): 73.9 Body mass index is 22.24 kg/m. 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 He then had a R VATS with decortication, drainage and esophageal stent 08/20/23. He had multiple J tube exchanges that decreased leakage. 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 He was discharged 4/28 but then readmitted 5/2 with failure to thrive, increased J tube leakage, poor po intake, and milky output from chest tube. Seth Burch TF was restarted 5/9 and was tolerating but started developing abdominal pressure/cramping and loose stools around 5/12. TF was turned off 5/13 PM per patient request. During this time chest tube output continued to be milky white but slowed. 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 Per patient, abdomen feels "terrible" with a lot of bloating and pressure. Patient having a lot of flatus and loose stools with a light tan color close to the color of his tube feeds. Pharmacy consulted for TPN to supplement TF.  Per IV team, ok to use port for TPN.  Glucose/Insulin : A1c 7.1% - CBGs elevated since the initiation of TF Used 26 units SSI with additional 9 units, 30 units insulin  in TPN Electrolytes: Na 133 (FW Q4H per RD), low CO2 (max acetate), others WNL (Phos 2.4 post NaPhos ) Renal: SCr 1.48 (BL 1.3), BUN 64 Hepatic: alk phos up to 160, AST/ALT/tbili WNL, albumin  1.8, TG 446 5/19 Intake / Output; MIVF: UOP 1 ml/kg/hr, JP drain 40mL, BM x3 GI Imaging: 5/9 KUB:  nonobstructive bowel gas pattern GI Surgeries / Procedures:  5/5 EGD stent removal, inspect lumen, new stent placed   Central access: port and double lumen PICC placed 10/03/23 TPN start date: 10/03/23  Nutritional Goals: Goal TPN rate is 90 mL/hr (provides 130g AA and 2290 kCal per day)  RD Estimated Needs Total Energy Estimated Needs: 2200-2400 kcal Total Protein Estimated Needs: 115-130 gm Total Fluid Estimated Needs: >2L/day  Current Nutrition:  TPN to meet 50% of needs Vivonex RTF at 45 ml/hr (goal rate to meet 50% of needs)  Plan:  Continue 1/2 TPN and Vivonex at 45 ml/hr per Surgery to meet 100% of needs Increase TPN to 50 ml/hr to provide volume for electrolyte adjustments TPN provides 1151 kCal and 66g AA per day, meeting ~50% of needs Electrolytes in TPN: Na 25 mEq/L, K 35 mEq/L, add Ca 5 mEq/L, Mg 5 mEq/L, increase Phos to 15 mmol/L, max acetate  No standard MVI and trace elements in TPN (PO multivitamin daily) Increase to moderate SSI Q4H and start Semglee  20 units daily to account for TF Continue 30 units insulin  in TPN since patient was controlled close to this amount previously - hesitant to increase to cover for both TPN and TF since will eventually cycle TPN Hyperglycemia contributing modestly to hypoNa - monitor Na as we're correcting glucose Monitor TPN labs on Mon/Thurs - consider rechecking TG and labs on 5/24 Cycle TPN once CBGs are better controlled  Seth Burch, PharmD, BCPS, BCCCP 10/09/2023, 10:12 AM

## 2023-10-10 LAB — GLUCOSE, CAPILLARY
Glucose-Capillary: 148 mg/dL — ABNORMAL HIGH (ref 70–99)
Glucose-Capillary: 166 mg/dL — ABNORMAL HIGH (ref 70–99)
Glucose-Capillary: 167 mg/dL — ABNORMAL HIGH (ref 70–99)
Glucose-Capillary: 187 mg/dL — ABNORMAL HIGH (ref 70–99)
Glucose-Capillary: 214 mg/dL — ABNORMAL HIGH (ref 70–99)
Glucose-Capillary: 221 mg/dL — ABNORMAL HIGH (ref 70–99)

## 2023-10-10 LAB — BASIC METABOLIC PANEL WITH GFR
Anion gap: 5 (ref 5–15)
BUN: 58 mg/dL — ABNORMAL HIGH (ref 8–23)
CO2: 23 mmol/L (ref 22–32)
Calcium: 8.3 mg/dL — ABNORMAL LOW (ref 8.9–10.3)
Chloride: 107 mmol/L (ref 98–111)
Creatinine, Ser: 1.41 mg/dL — ABNORMAL HIGH (ref 0.61–1.24)
GFR, Estimated: 55 mL/min — ABNORMAL LOW (ref >60)
Glucose, Bld: 195 mg/dL — ABNORMAL HIGH (ref 70–99)
Potassium: 3.7 mmol/L (ref 3.5–5.1)
Sodium: 135 mmol/L (ref 135–145)

## 2023-10-10 LAB — PHOSPHORUS: Phosphorus: 2.4 mg/dL — ABNORMAL LOW (ref 2.5–4.6)

## 2023-10-10 LAB — TRIGLYCERIDES: Triglycerides: 141 mg/dL (ref <150)

## 2023-10-10 MED ORDER — TRAVASOL 10 % IV SOLN
INTRAVENOUS | Status: AC
  Filled 2023-10-10: qty 660

## 2023-10-10 MED ORDER — INSULIN ASPART 100 UNIT/ML IJ SOLN
0.0000 [IU] | INTRAMUSCULAR | Status: DC
  Administered 2023-10-10: 7 [IU] via SUBCUTANEOUS
  Administered 2023-10-10 – 2023-10-11 (×3): 4 [IU] via SUBCUTANEOUS
  Administered 2023-10-11 (×2): 3 [IU] via SUBCUTANEOUS
  Administered 2023-10-11 (×2): 4 [IU] via SUBCUTANEOUS
  Administered 2023-10-12 – 2023-10-13 (×10): 3 [IU] via SUBCUTANEOUS
  Administered 2023-10-13: 4 [IU] via SUBCUTANEOUS
  Administered 2023-10-13 – 2023-10-14 (×3): 3 [IU] via SUBCUTANEOUS

## 2023-10-10 MED ORDER — POTASSIUM PHOSPHATES 15 MMOLE/5ML IV SOLN
15.0000 mmol | Freq: Once | INTRAVENOUS | Status: AC
  Administered 2023-10-10: 15 mmol via INTRAVENOUS
  Filled 2023-10-10: qty 5

## 2023-10-10 NOTE — Progress Notes (Signed)
 PHARMACY - TOTAL PARENTERAL NUTRITION CONSULT NOTE  Indication: Intolerance to EN   Patient Measurements: Height: 5\' 10"  (177.8 cm) Weight: 70.3 kg (154 lb 15.7 oz) IBW/kg (Calculated) : 73 TPN AdjBW (KG): 73.9 Body mass index is 22.24 kg/m. 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 He then had a R VATS with decortication, drainage and esophageal stent 08/20/23. He had multiple J tube exchanges that decreased leakage. 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 He was discharged 4/28 but then readmitted 5/2 with failure to thrive, increased J tube leakage, poor po intake, and milky output from chest tube. Johny Nap TF was restarted 5/9 and was tolerating but started developing abdominal pressure/cramping and loose stools around 5/12. TF was turned off 5/13 PM per patient request. During this time chest tube output continued to be milky white but slowed. 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 Per patient, abdomen feels "terrible" with a lot of bloating and pressure. Patient having a lot of flatus and loose stools with a light tan color close to the color of his tube feeds. Pharmacy consulted for TPN to supplement TF.  Per IV team, ok to use port for TPN.  Glucose/Insulin : A1c 7.1% - CBGs elevated since the initiation of TF. CBGs [id number]. Used 30 units SSI yday, started lantus  20 units yday, also with 30 units insulin  in TPN. Electrolytes: Na 135 (FW Q4H per RD), K 3.7, Phos 2.4  Renal: SCr 1.48 (BL 1.3), BUN 64 Hepatic: alk phos up to 160, AST/ALT/tbili WNL, albumin  1.8, TG down to nl Intake / Output; MIVF: UOP 1400 ml yday, JP drain 45mL GI Imaging: 5/9 KUB: nonobstructive bowel  gas pattern 5/19 CT chest/abd: Small amount of debris and fluid is seen surrounding the esophagus at the level of the anastomotic leak, however, this appears improved since prior examination. No evidence of intestinal obstruction. GI Surgeries / Procedures:  5/5 EGD stent removal, inspect lumen, new stent placed   Central access: port and double lumen PICC placed 10/03/23 TPN start date: 10/03/23  Nutritional Goals: Goal TPN rate is 90 mL/hr (provides 130g AA and 2290 kCal per day)  RD Estimated Needs Total Energy Estimated Needs: 2200-2400 kcal Total Protein Estimated Needs: 115-130 gm Total Fluid Estimated Needs: >2L/day  Current Nutrition:  TPN at 50 ml/hr provides 1150 kcal and 66 g protein Vivonex RTF at 45 ml/hr provides 1080 kcal and 54 gm protein  Plan:  Continue TPN at 50ml/hr and Vivonex at 45 ml/hr per surgery to meet 100% of needs Electrolytes in TPN: Na 25 mEq/L, increase K to 45 mEq/L, Ca 5 mEq/L, Mg 5 mEq/L, increase Phos 20 mmol/L, max acetate  Potassium phosphate 15 mmol IV  No standard MVI and trace elements in TPN (PO multivitamin daily) Change SSI to resistant Q4H  Continue Semglee  20 units daily to account for TF Continue 30 units insulin  in TPN since patient was controlled close to this amount previously - hesitant to increase to cover for both TPN and TF since will eventually cycle TPN Monitor TPN labs on Mon/Thurs - recheck BMET, Phos, Mg 5/25 Cycle TPN once CBGs are more controlled  Enrigue Harvard, PharmD, BCPS Please see amion for complete clinical pharmacist  phone list 10/10/2023, 8:59 AM

## 2023-10-10 NOTE — Progress Notes (Signed)
      [address]       [place] [address]             [phone]      15 Days Post-Op Procedure(s) (LRB): BRONCHOSCOPY, FLEXIBLE (N/A) Subjective:  Patient doing okay.  Hasn't been experiencing diarrhea.  Continues to cough up thickened sputum.  Objective: Vital signs in last 24 hours: Temp:  [96.9 F (36.1 C)-97.9 F (36.6 C)] 97.8 F (36.6 C) (05/23 0406) Pulse Rate:  [72-94] 78 (05/23 0406) Cardiac Rhythm: Normal sinus rhythm (05/22 1901) Resp:  [18-24] 18 (05/23 0406) BP: (95-116)/(59-69) 116/68 (05/23 0406) SpO2:  [94 %-99 %] 94 % (05/23 0406)  Intake/Output from previous day: 05/22 0701 - 05/23 0700 In: 4366.5 [I.V.:1135.2; NG/GT:3041.3] Out: 1425 [Urine:1400; Drains:25]  General appearance: alert, cooperative, and no distress Heart: regular rate and rhythm Lungs: diminished breath sounds bibasilar Abdomen: soft, non-tender; bowel sounds normal; no masses,  no organomegaly Extremities: extremities normal, atraumatic, no cyanosis or edema Wound: Jejunostomy tube with minimal greenish drainage  Lab Results: No results for input(s): "WBC", "HGB", "HCT", "PLT" in the last 72 hours. BMET:  Recent Labs    10/08/23 0415 10/09/23 0648  NA 134* 133*  K 3.8 3.7  CL 111 106  CO2 17* 18*  GLUCOSE 322* 289*  BUN 58* 64*  CREATININE 1.40* 1.48*  CALCIUM  7.7* 8.0*    PT/INR: No results for input(s): "LABPROT", "INR" in the last 72 hours. ABG    Component Value Date/Time   PHART 7.4 07/29/2023 0514   HCO3 25.8 08/02/2023 2006   TCO2 17 (L) 08/19/2023 2022   ACIDBASEDEF 1.0 08/02/2023 1506   O2SAT 94.5 08/02/2023 2006   CBG (last 3)  Recent Labs    10/09/23 2026 10/09/23 2353 10/10/23 0409  GLUCAP 221* 185* 221*    Assessment/Plan: S/P Procedure(s) (LRB): BRONCHOSCOPY, FLEXIBLE (N/A)  CV- NSR, BP stable- continue Lopressor , Norvasc  Pulm- thickened purulent sputum persists, JP drain remains in place 25 cc output Renal- AKI on CKD,  creatinine has been stable GI- NPO on TPN running to meet 50% goal, Tube feedings running to meet 50% goal... no diarrhea thus far ID- Staph Aureus sputum... on Ancef  DM-sugars remain elevated... per pharmacy conversation 48 hours ago this is being managed by them... insulin  needs adjusted Deconditioning- continue PT/OT, home health needs have been ordered   LOS: 21 days    [name], PA-C 10/10/2023

## 2023-10-10 NOTE — Progress Notes (Signed)
 Physical Therapy Treatment Patient Details Name: Seth Burch MRN: [id number] DOB: 10-31-1955 Today's Date: 10/10/2023   History of Present Illness 68 y.o. male presents to [facility] 09/19/23 after follow-up with Dr.Lightfoot with failure to thrive, low O2 sats/SOB and milky-output of chest tube. Pt with chylothorax, esophageal anastomotic leak, and J tube bilious drainage. EGD on 5/5 with stent removal and inspection of esophageal lumen revealed persistent perforation. New esoph. stent placed. Seth Burch Extensive prior admits 3/10-4/1 and 4/1-4/28. 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 Pt progressing gait distance, prior to coughing episode, with rollator support and CGA for safety with pt demonstrating good recall for rollator use safety and fair recall for importance of slowed gait cadence for decreased DOE. Pt expressing appreciation for therapies at end of session. Pt continues to benefit from skilled PT services to progress toward functional mobility goals.     If plan is discharge home, recommend the following: Assistance with cooking/housework;Assist for transportation;Help with stairs or ramp for entrance;A little help with walking and/or transfers;A little help with bathing/dressing/bathroom   Can travel by private vehicle        Equipment Recommendations  None recommended by PT    Recommendations for Other Services       Precautions / Restrictions Precautions Precautions: Fall Recall of Precautions/Restrictions: Intact Precaution/Restrictions Comments: L UQ jejunostomy, Rt JP  drain Restrictions Weight Bearing Restrictions Per Provider Order: No     Mobility  Bed Mobility Overal bed mobility: Needs Assistance Bed Mobility: Supine to Sit, Sit to Supine Rolling: Supervision Sidelying to sit: Supervision   Sit to supine: Supervision        Transfers Overall transfer level: Needs assistance Equipment used: Rollator (4 wheels) Transfers: Sit to/from Stand Sit to Stand: Contact guard assist           General transfer comment: CGA for safety, good carryover for safe rollator use    Ambulation/Gait Ambulation/Gait assistance: Contact guard assist Gait Distance (Feet): 150 Feet Assistive device: Rollator (4 wheels) Gait Pattern/deviations: WFL(Within Functional Limits) Gait velocity: decr     General Gait Details: cues for slower gait cadence, pt with coughing episode during seated rest needing to be pushed back to room on rollator seat   Stairs             Wheelchair Mobility     Tilt Bed    Modified Rankin (Stroke Patients Only)       Balance Overall balance assessment: Needs assistance Sitting-balance support: Feet supported, Bilateral upper extremity supported Sitting balance-Leahy Scale: Fair Sitting balance - Comments: sat EOB ~ 6-8 minutes   Standing balance support: Bilateral upper extremity supported, No upper extremity supported Standing balance-Leahy Scale: Fair Standing balance comment: was able to work on peri care following bm                            Communication Communication Communication: No apparent difficulties Factors Affecting Communication: Difficulty expressing self  Cognition Arousal: Alert Behavior During Therapy: Flat affect   PT - Cognitive impairments: No apparent impairments  Following commands: Intact Following commands impaired: Follows one step commands inconsistently, Follows one step commands with increased time    Cueing Cueing  Techniques: Verbal cues  Exercises      General Comments General comments (skin integrity, edema, etc.): VSS on RA      Pertinent Vitals/Pain Pain Assessment Pain Assessment: Faces Faces Pain Scale: Hurts a little bit Pain Location: general aching, coughing Pain Descriptors / Indicators: Aching Pain Intervention(s): Monitored during session, Limited activity within patient's tolerance    Home Living                          Prior Function            PT Goals (current goals can now be found in the care plan section) Acute Rehab PT Goals Patient Stated Goal: to go home PT Goal Formulation: With patient/family Time For Goal Achievement: 10/20/23 Progress towards PT goals: Progressing toward goals    Frequency    Min 3X/week      PT Plan      Co-evaluation              AM-PAC PT "6 Clicks" Mobility   Outcome Measure  Help needed turning from your back to your side while in a flat bed without using bedrails?: A Little Help needed moving from lying on your back to sitting on the side of a flat bed without using bedrails?: A Little Help needed moving to and from a bed to a chair (including a wheelchair)?: A Little Help needed standing up from a chair using your arms (e.g., wheelchair or bedside chair)?: A Little Help needed to walk in hospital room?: A Little Help needed climbing 3-5 steps with a railing? : A Lot 6 Click Score: 17    End of Session   Activity Tolerance: Patient tolerated treatment well;Other (comment) (limited by coughing) Patient left: in bed;with call bell/phone within reach Nurse Communication: Mobility status PT Visit Diagnosis: Other abnormalities of gait and mobility (R26.89);Unsteadiness on feet (R26.81);Muscle weakness (generalized) (M62.81)     Time: 1478-2956 PT Time Calculation (min) (ACUTE ONLY): 26 min  Charges:    $Gait Training: 23-37 mins PT General Charges $$ ACUTE PT VISIT: 1 Visit                      Seth Burch R. PTA Acute Rehabilitation Services Office: [phone]   Seth Burch 10/10/2023, 3:11 PM

## 2023-10-10 NOTE — Progress Notes (Signed)
 OT Cancellation Note  Patient Details Name: Seth Burch MRN: [id number] DOB: 1955/07/27   Cancelled Treatment:    Reason Eval/Treat Not Completed: Fatigue/lethargy limiting ability to participate (Pt fatigued after long walk with PT. Will continue to follow.)  Jonette Nestle 10/10/2023, 3:09 PM Avanell Leigh, OTR/L Acute Rehabilitation Services Office: [phone]

## 2023-10-10 NOTE — Plan of Care (Signed)
  Problem: Education: Goal: Knowledge of General Education information will improve Description: Including pain rating scale, medication(s)/side effects and non-pharmacologic comfort measures Outcome: Progressing   Problem: Health Behavior/Discharge Planning: Goal: Ability to manage health-related needs will improve Outcome: Progressing   Problem: Clinical Measurements: Goal: Ability to maintain clinical measurements within normal limits will improve Outcome: Progressing Goal: Will remain free from infection Outcome: Progressing Goal: Diagnostic test results will improve Outcome: Progressing Goal: Respiratory complications will improve Outcome: Progressing Goal: Cardiovascular complication will be avoided Outcome: Progressing   Problem: Activity: Goal: Risk for activity intolerance will decrease Outcome: Progressing   Problem: Nutrition: Goal: Adequate nutrition will be maintained Outcome: Progressing   Problem: Coping: Goal: Level of anxiety will decrease Outcome: Progressing   Problem: Pain Managment: Goal: General experience of comfort will improve and/or be controlled Outcome: Progressing   Problem: Safety: Goal: Ability to remain free from injury will improve Outcome: Progressing   Problem: Skin Integrity: Goal: Risk for impaired skin integrity will decrease Outcome: Progressing   Problem: Education: Goal: Ability to describe self-care measures that may prevent or decrease complications (Diabetes Survival Skills Education) will improve Outcome: Progressing   Problem: Coping: Goal: Ability to adjust to condition or change in health will improve Outcome: Progressing   Problem: Fluid Volume: Goal: Ability to maintain a balanced intake and output will improve Outcome: Progressing   Problem: Health Behavior/Discharge Planning: Goal: Ability to identify and utilize available resources and services will improve Outcome: Progressing Goal: Ability to manage  health-related needs will improve Outcome: Progressing   Problem: Metabolic: Goal: Ability to maintain appropriate glucose levels will improve Outcome: Progressing   Problem: Nutritional: Goal: Maintenance of adequate nutrition will improve Outcome: Progressing Goal: Progress toward achieving an optimal weight will improve Outcome: Progressing   Problem: Skin Integrity: Goal: Risk for impaired skin integrity will decrease Outcome: Progressing   Problem: Tissue Perfusion: Goal: Adequacy of tissue perfusion will improve Outcome: Progressing

## 2023-10-11 LAB — GLUCOSE, CAPILLARY
Glucose-Capillary: 197 mg/dL — ABNORMAL HIGH (ref 70–99)
Glucose-Capillary: 130 mg/dL — ABNORMAL HIGH (ref 70–99)
Glucose-Capillary: 155 mg/dL — ABNORMAL HIGH (ref 70–99)
Glucose-Capillary: 182 mg/dL — ABNORMAL HIGH (ref 70–99)
Glucose-Capillary: 169 mg/dL — ABNORMAL HIGH (ref 70–99)
Glucose-Capillary: 146 mg/dL — ABNORMAL HIGH (ref 70–99)

## 2023-10-11 MED ORDER — TRAVASOL 10 % IV SOLN
INTRAVENOUS | Status: AC
  Filled 2023-10-11: qty 660

## 2023-10-11 MED ORDER — INSULIN GLARGINE-YFGN 100 UNIT/ML ~~LOC~~ SOLN
25.0000 [IU] | Freq: Every day | SUBCUTANEOUS | Status: DC
  Administered 2023-10-11 – 2023-10-14 (×4): 25 [IU] via SUBCUTANEOUS
  Filled 2023-10-11 (×4): qty 0.25

## 2023-10-11 NOTE — Progress Notes (Addendum)
 16 Days Post-Op Procedure(s) (LRB): BRONCHOSCOPY, FLEXIBLE (N/A) Subjective: Feels ok, some steady improvement in conditioning but issues with fatigue at times Diarrhea conts to improve Conts to have productive cough Objective: Vital signs in last 24 hours: Temp:  [97.6 F (36.4 C)-98.4 F (36.9 C)] 98.4 F (36.9 C) (05/24 0258) Pulse Rate:  [75-95] 84 (05/24 0258) Cardiac Rhythm: Normal sinus rhythm (05/23 1918) Resp:  [19-20] 20 (05/24 0258) BP: (102-142)/(61-77) 142/77 (05/24 0258) SpO2:  [97 %-100 %] 99 % (05/23 2343) Weight:  [70.4 kg] 70.4 kg (05/24 0258)  Hemodynamic parameters for last 24 hours:    Intake/Output from previous day: 05/23 0701 - 05/24 0700 In: 1846.3 [I.V.:945.3; NG/GT:483; IV Piggyback:258] Out: 1760 [Urine:1750; Drains:10] Intake/Output this shift: No intake/output data recorded.  General appearance: alert, cooperative, and no distress Heart: regular rate and rhythm Lungs: some coarseness in upper airways Abdomen: benign Extremities: no edema or calf tenderness Wound: incis and wounds remain stable   Lab Results: No results for input(s): "WBC", "HGB", "HCT", "PLT" in the last 72 hours. BMET:  Recent Labs    10/09/23 0648 10/10/23 0959  NA 133* 135  K 3.7 3.7  CL 106 107  CO2 18* 23  GLUCOSE 289* 195*  BUN 64* 58*  CREATININE 1.48* 1.41*  CALCIUM  8.0* 8.3*    PT/INR: No results for input(s): "LABPROT", "INR" in the last 72 hours. ABG    Component Value Date/Time   PHART 7.4 07/29/2023 0514   HCO3 25.8 08/02/2023 2006   TCO2 17 (L) 08/19/2023 2022   ACIDBASEDEF 1.0 08/02/2023 1506   O2SAT 94.5 08/02/2023 2006   CBG (last 3)  Recent Labs    10/10/23 2025 10/10/23 2345 10/11/23 0401  GLUCAP 187* 148* 197*    Meds Scheduled Meds:  amLODipine   10 mg Per Tube Daily   atorvastatin   20 mg Per Tube Daily   Chlorhexidine  Gluconate Cloth  6 each Topical Daily   doxazosin   2 mg Per Tube Daily   enoxaparin  (LOVENOX ) injection  40  mg Subcutaneous Q24H   escitalopram   5 mg Per Tube Daily   famotidine   20 mg Per Tube Daily   feeding supplement (PROSource TF20)  60 mL Per Tube Daily   fiber  1 packet Per Tube BID   finasteride   5 mg Oral Daily   folic acid   1 mg Per Tube Daily   free water   150 mL Per Tube Q4H   Gerhardt's butt cream   Topical TID   insulin  aspart  0-20 Units Subcutaneous Q4H   insulin  glargine-yfgn  20 Units Subcutaneous Daily   metoprolol  tartrate  25 mg Per Tube BID   multivitamin with minerals  1 tablet Per Tube Daily   saccharomyces boulardii  250 mg Oral BID   simethicone   80 mg Per Tube QID   sodium chloride  flush  10-40 mL Intracatheter Q12H   sodium chloride  HYPERTONIC  4 mL Nebulization Daily   thiamine   100 mg Per Tube Daily   Continuous Infusions:   ceFAZolin  (ANCEF ) IV 2 g (10/11/23 0224)   TPN ADULT (ION) 50 mL/hr at 10/11/23 0202   Vivonex RTF 45 mL/hr at 10/09/23 0331   PRN Meds:.acetaminophen  **OR** acetaminophen , chlorpheniramine-HYDROcodone, liver oil-zinc  oxide, morphine  injection, ondansetron  **OR** ondansetron  (ZOFRAN ) IV, oxyCODONE , sodium chloride  flush  Xrays No results found.  Assessment/Plan: S/P Procedure(s) (LRB): BRONCHOSCOPY, FLEXIBLE (N/A)  1 afeb, VSS, sinus rhythm 2 O2 sats good on RA 3 Good UOP 4 conts same TPN/TF combination  5 BS improved control- conts same - Pharm making adjustments in TPN 6 no new labs or Xrays 7 conts to work with therapies- limited by fatigue 8 minimal output from drain 9 remains on ancef  for pulm coverage of staph aureus in sputum 10 hopefully can have arrangements in place and clinical condition allows for d/c early next week  LOS: 22 days    Lindi Revering PA-C Pager [phone] 10/11/2023   Agree dispo planning Likely Tuesday  Hilarie Lovely

## 2023-10-11 NOTE — Progress Notes (Signed)
 PHARMACY - TOTAL PARENTERAL NUTRITION CONSULT NOTE  Indication: Intolerance to EN   Patient Measurements: Height: 5\' 10"  (177.8 cm) Weight: 70.4 kg (155 lb 3.3 oz) IBW/kg (Calculated) : 73 TPN AdjBW (KG): 73.9 Body mass index is 22.27 kg/m. 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 He then had a R VATS with decortication, drainage and esophageal stent 08/20/23. He had multiple J tube exchanges that decreased leakage. 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 He was discharged 4/28 but then readmitted 5/2 with failure to thrive, increased J tube leakage, poor po intake, and milky output from chest tube. Johny Nap TF was restarted 5/9 and was tolerating but started developing abdominal pressure/cramping and loose stools around 5/12. TF was turned off 5/13 PM per patient request. During this time chest tube output continued to be milky white but slowed. 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 Per patient, abdomen feels "terrible" with a lot of bloating and pressure. Patient having a lot of flatus and loose stools with a light tan color close to the color of his tube feeds. Pharmacy consulted for TPN to supplement TF.  Per IV team, ok to use port for TPN.  Glucose/Insulin : A1c 7.1% - CBGs elevated since the initiation of TF. CBGs 148-197. Used 25 units SSI yesterday. Lantus  20 units daily and 30 units insulin  in TPN.  Electrolytes: No labs today Renal: 5/23 SCr 1.48 (BL 1.3), BUN 64 Hepatic: 5/23 alk phos up to 160, AST/ALT/tbili WNL, albumin  1.8, TG down to nl Intake / Output; MIVF: UOP 2100 ml, JP drain 10mL GI Imaging: 5/9 KUB: nonobstructive bowel gas pattern 5/21 CT chest/abd: Small  amount of debris and fluid is seen surrounding the esophagus at the level of the anastomotic leak, however, this appears improved since prior examination. No evidence of intestinal obstruction. GI Surgeries / Procedures:  5/5 EGD stent removal, inspect lumen, new stent placed   Central access: port and double lumen PICC placed 10/03/23 TPN start date: 10/03/23  Nutritional Goals: Goal TPN rate is 90 mL/hr (provides 130g AA and 2290 kCal per day)  RD Estimated Needs Total Energy Estimated Needs: 2200-2400 kcal Total Protein Estimated Needs: 115-130 gm Total Fluid Estimated Needs: >2L/day  Current Nutrition:  TPN at 50 ml/hr provides 1150 kcal and 66 g protein Vivonex RTF at 45 ml/hr provides 1080 kcal and 54 gm protein  Plan:  Continue TPN at 50ml/hr and Vivonex at 45 ml/hr per surgery to meet 100% of needs Electrolytes in TPN: Na 25 mEq/L, K 45 mEq/L, Ca 5 mEq/L, Mg 5 mEq/L, Phos 20 mmol/L, max acetate  No standard MVI and trace elements in TPN (PO multivitamin daily) Continue resistant SSI Q4H  Increase Semglee  to 25 units daily (to account for TF) Continue 30 units insulin  in TPN since patient was controlled close to this amount previously - hesitant to increase to cover for both TPN and TF since will eventually cycle TPN Monitor TPN labs on Mon/Thurs - recheck BMET, Phos, Mg tomorrow Plan to cycle TPN once CBGs well controlled  Enrigue Harvard, PharmD, BCPS Please see amion for complete clinical pharmacist phone list 10/11/2023, 8:20 AM

## 2023-10-12 LAB — GLUCOSE, CAPILLARY
Glucose-Capillary: 121 mg/dL — ABNORMAL HIGH (ref 70–99)
Glucose-Capillary: 123 mg/dL — ABNORMAL HIGH (ref 70–99)
Glucose-Capillary: 130 mg/dL — ABNORMAL HIGH (ref 70–99)
Glucose-Capillary: 135 mg/dL — ABNORMAL HIGH (ref 70–99)
Glucose-Capillary: 135 mg/dL — ABNORMAL HIGH (ref 70–99)
Glucose-Capillary: 139 mg/dL — ABNORMAL HIGH (ref 70–99)

## 2023-10-12 LAB — MAGNESIUM: Magnesium: 2 mg/dL (ref 1.7–2.4)

## 2023-10-12 LAB — BASIC METABOLIC PANEL WITH GFR
Anion gap: 9 (ref 5–15)
BUN: 60 mg/dL — ABNORMAL HIGH (ref 8–23)
CO2: 22 mmol/L (ref 22–32)
Calcium: 8.5 mg/dL — ABNORMAL LOW (ref 8.9–10.3)
Chloride: 106 mmol/L (ref 98–111)
Creatinine, Ser: 1.38 mg/dL — ABNORMAL HIGH (ref 0.61–1.24)
GFR, Estimated: 56 mL/min — ABNORMAL LOW (ref >60)
Glucose, Bld: 128 mg/dL — ABNORMAL HIGH (ref 70–99)
Potassium: 4.3 mmol/L (ref 3.5–5.1)
Sodium: 137 mmol/L (ref 135–145)

## 2023-10-12 LAB — PHOSPHORUS: Phosphorus: 3.4 mg/dL (ref 2.5–4.6)

## 2023-10-12 MED ORDER — TRAVASOL 10 % IV SOLN
INTRAVENOUS | Status: AC
Start: 2023-10-12 — End: 2023-10-13
  Filled 2023-10-12: qty 660

## 2023-10-12 NOTE — Plan of Care (Signed)

## 2023-10-12 NOTE — Progress Notes (Signed)
 PHARMACY - TOTAL PARENTERAL NUTRITION CONSULT NOTE  Indication: Intolerance to EN   Patient Measurements: Height: 5\' 10"  (177.8 cm) Weight: 71.7 kg (158 lb 1.1 oz) IBW/kg (Calculated) : 73 TPN AdjBW (KG): 73.9 Body mass index is 22.68 kg/m. 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 He then had a R VATS with decortication, drainage and esophageal stent 08/20/23. He had multiple J tube exchanges that decreased leakage. 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 He was discharged 4/28 but then readmitted 5/2 with failure to thrive, increased J tube leakage, poor po intake, and milky output from chest tube. Johny Nap TF was restarted 5/9 and was tolerating but started developing abdominal pressure/cramping and loose stools around 5/12. TF was turned off 5/13 PM per patient request. During this time chest tube output continued to be milky white but slowed. 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 Per patient, abdomen feels "terrible" with a lot of bloating and pressure. Patient having a lot of flatus and loose stools with a light tan color close to the color of his tube feeds. Pharmacy consulted for TPN to supplement TF.  Per IV team, ok to use port for TPN.  Glucose/Insulin : A1c 7.1% - CBGs elevated since the initiation of TF. CBGs <180. Used 17 units SSI yesterday. Lantus  20 units daily and 30 units insulin  in TPN.  Electrolytes: Lytes wnl Renal: SCr 1.38 (BL 1.3), BUN 60 Hepatic: 5/23 alk phos up to 160, AST/ALT/tbili WNL, albumin  1.8, TG down to nl Intake / Output; MIVF: UOP 1800 ml, JP drain 10mL GI Imaging: 5/9 KUB: nonobstructive bowel gas pattern 5/21 CT chest/abd: Small amount of  debris and fluid is seen surrounding the esophagus at the level of the anastomotic leak, however, this appears improved since prior examination. No evidence of intestinal obstruction. GI Surgeries / Procedures:  5/5 EGD stent removal, inspect lumen, new stent placed   Central access: port and double lumen PICC placed 10/03/23 TPN start date: 10/03/23  Nutritional Goals: Goal TPN rate is 90 mL/hr (provides 130g AA and 2290 kCal per day)  RD Estimated Needs Total Energy Estimated Needs: 2200-2400 kcal Total Protein Estimated Needs: 115-130 gm Total Fluid Estimated Needs: >2L/day  Current Nutrition:  TPN at 50 ml/hr provides 1150 kcal and 66 g protein Vivonex RTF at 45 ml/hr provides 1080 kcal and 54 gm protein  Plan:  Continue TPN at 50ml/hr and Vivonex at 45 ml/hr per surgery to meet 100% of needs Electrolytes in TPN: Na 25 mEq/L, K 45 mEq/L, Ca 5 mEq/L, Mg 5 mEq/L, Phos 20 mmol/L, max acetate  No standard MVI and trace elements in TPN (PO multivitamin daily) Continue resistant SSI Q4H  Continue Semglee  at 25 units daily (to account for TF) Continue 30 units insulin  in TPN since patient was controlled close to this amount previously - hesitant to increase to cover for both TPN and TF since will eventually cycle TPN Monitor TPN labs on Mon/Thurs Plan to cycle TPN Monday if CBGs continue to be well controlled  Enrigue Harvard, PharmD, BCPS Please see amion for complete clinical pharmacist phone list 10/12/2023, 11:53 AM

## 2023-10-12 NOTE — Progress Notes (Addendum)
 17 Days Post-Op Procedure(s) (LRB): BRONCHOSCOPY, FLEXIBLE (N/A) Subjective: Conts to have issues w/ cough but fairly stable  Objective: Vital signs in last 24 hours: Temp:  [97.5 F (36.4 C)-98.3 F (36.8 C)] 98 F (36.7 C) (05/25 0331) Pulse Rate:  [82-96] 96 (05/25 0331) Cardiac Rhythm: Normal sinus rhythm (05/24 1900) Resp:  [19-24] 20 (05/25 0331) BP: (104-137)/(64-82) 114/64 (05/25 0331) SpO2:  [95 %-96 %] 96 % (05/25 0331) Weight:  [71.7 kg] 71.7 kg (05/25 0234)  Hemodynamic parameters for last 24 hours:    Intake/Output from previous day: 05/24 0701 - 05/25 0700 In: -  Out: 1760 [Urine:1750; Drains:10] Intake/Output this shift: No intake/output data recorded.  General appearance: alert, cooperative, and no distress Heart: regular rate and rhythm Lungs: crackles right base Abdomen: benign Extremities: no edema , warm Wound: stable, dressings CDI  Lab Results: No results for input(s): "WBC", "HGB", "HCT", "PLT" in the last 72 hours. BMET:  Recent Labs    10/10/23 0959  NA 135  K 3.7  CL 107  CO2 23  GLUCOSE 195*  BUN 58*  CREATININE 1.41*  CALCIUM  8.3*    PT/INR: No results for input(s): "LABPROT", "INR" in the last 72 hours. ABG    Component Value Date/Time   PHART 7.4 07/29/2023 0514   HCO3 25.8 08/02/2023 2006   TCO2 17 (L) 08/19/2023 2022   ACIDBASEDEF 1.0 08/02/2023 1506   O2SAT 94.5 08/02/2023 2006   CBG (last 3)  Recent Labs    10/11/23 2030 10/11/23 2309 10/12/23 0334  GLUCAP 169* 146* 139*    Meds Scheduled Meds:  amLODipine   10 mg Per Tube Daily   atorvastatin   20 mg Per Tube Daily   Chlorhexidine  Gluconate Cloth  6 each Topical Daily   doxazosin   2 mg Per Tube Daily   enoxaparin  (LOVENOX ) injection  40 mg Subcutaneous Q24H   escitalopram   5 mg Per Tube Daily   famotidine   20 mg Per Tube Daily   feeding supplement (PROSource TF20)  60 mL Per Tube Daily   fiber  1 packet Per Tube BID   finasteride   5 mg Oral Daily   folic  acid  1 mg Per Tube Daily   free water   150 mL Per Tube Q4H   Gerhardt's butt cream   Topical TID   insulin  aspart  0-20 Units Subcutaneous Q4H   insulin  glargine-yfgn  25 Units Subcutaneous Daily   metoprolol  tartrate  25 mg Per Tube BID   multivitamin with minerals  1 tablet Per Tube Daily   saccharomyces boulardii  250 mg Oral BID   simethicone   80 mg Per Tube QID   sodium chloride  flush  10-40 mL Intracatheter Q12H   thiamine   100 mg Per Tube Daily   Continuous Infusions:   ceFAZolin  (ANCEF ) IV 2 g (10/12/23 0239)   TPN ADULT (ION) 50 mL/hr at 10/11/23 1718   Vivonex RTF 45 mL/hr at 10/11/23 0836   PRN Meds:.acetaminophen  **OR** acetaminophen , chlorpheniramine-HYDROcodone, liver oil-zinc  oxide, morphine  injection, ondansetron  **OR** ondansetron  (ZOFRAN ) IV, oxyCODONE , sodium chloride  flush  Xrays No results found.  Assessment/Plan: S/P Procedure(s) (LRB): BRONCHOSCOPY, FLEXIBLE (N/A)  1 afeb, VSS, NSR 2 O2 sats good on RA 3 drain- minimal JP , slightly whitish 4 diarrhea is much improved 5 conts TF/TPN combo  6 no new labs 7 BS control is good pretty good, 130-190 range yesterday- pharmacy adjusting TPN insulin  8 cont to push rehab as able, poss home in next couple days with current  nutrition modalities    LOS: 23 days    Seth Revering PA-C Pager [phone] 10/12/2023  Agree Doing better Dispo planning  Seth Burch

## 2023-10-13 LAB — MAGNESIUM: Magnesium: 2 mg/dL (ref 1.7–2.4)

## 2023-10-13 LAB — COMPREHENSIVE METABOLIC PANEL WITH GFR
ALT: 7 U/L (ref 0–44)
AST: 20 U/L (ref 15–41)
Albumin: 1.9 g/dL — ABNORMAL LOW (ref 3.5–5.0)
Alkaline Phosphatase: 170 U/L — ABNORMAL HIGH (ref 38–126)
Anion gap: 8 (ref 5–15)
BUN: 58 mg/dL — ABNORMAL HIGH (ref 8–23)
CO2: 22 mmol/L (ref 22–32)
Calcium: 8.4 mg/dL — ABNORMAL LOW (ref 8.9–10.3)
Chloride: 104 mmol/L (ref 98–111)
Creatinine, Ser: 1.34 mg/dL — ABNORMAL HIGH (ref 0.61–1.24)
GFR, Estimated: 58 mL/min — ABNORMAL LOW (ref >60)
Glucose, Bld: 108 mg/dL — ABNORMAL HIGH (ref 70–99)
Potassium: 4.3 mmol/L (ref 3.5–5.1)
Sodium: 134 mmol/L — ABNORMAL LOW (ref 135–145)
Total Bilirubin: 0.3 mg/dL (ref 0.0–1.2)
Total Protein: 6.2 g/dL — ABNORMAL LOW (ref 6.5–8.1)

## 2023-10-13 LAB — GLUCOSE, CAPILLARY
Glucose-Capillary: 130 mg/dL — ABNORMAL HIGH (ref 70–99)
Glucose-Capillary: 161 mg/dL — ABNORMAL HIGH (ref 70–99)
Glucose-Capillary: 150 mg/dL — ABNORMAL HIGH (ref 70–99)
Glucose-Capillary: 123 mg/dL — ABNORMAL HIGH (ref 70–99)
Glucose-Capillary: 135 mg/dL — ABNORMAL HIGH (ref 70–99)
Glucose-Capillary: 134 mg/dL — ABNORMAL HIGH (ref 70–99)

## 2023-10-13 LAB — TRIGLYCERIDES: Triglycerides: 123 mg/dL (ref <150)

## 2023-10-13 LAB — PHOSPHORUS: Phosphorus: 3.7 mg/dL (ref 2.5–4.6)

## 2023-10-13 MED ORDER — SACCHAROMYCES BOULARDII 250 MG PO CAPS
250.0000 mg | ORAL_CAPSULE | Freq: Two times a day (BID) | ORAL | Status: DC
  Administered 2023-10-13 – 2023-10-14 (×2): 250 mg
  Filled 2023-10-13 (×2): qty 1

## 2023-10-13 MED ORDER — TRAVASOL 10 % IV SOLN
INTRAVENOUS | Status: AC
  Filled 2023-10-13: qty 660

## 2023-10-13 NOTE — Progress Notes (Signed)
 PT Cancellation Note  Patient Details Name: ROMERO LETIZIA MRN: [id number] DOB: 16-Aug-1955   Cancelled Treatment:    Reason Eval/Treat Not Completed: Fatigue/lethargy limiting ability to participate, pt with continued fatigue after session with OT. Will check back as schedule allows to continue with PT POC.  Beverly Buckler. PTA Acute Rehabilitation Services Office: [phone]    [name] 10/13/2023, 3:04 PM

## 2023-10-13 NOTE — Progress Notes (Signed)
 PHARMACY - TOTAL PARENTERAL NUTRITION CONSULT NOTE  Indication: Intolerance to EN   Patient Measurements: Height: 5\' 10"  (177.8 cm) Weight: 71.5 kg (157 lb 10.1 oz) IBW/kg (Calculated) : 73 TPN AdjBW (KG): 73.9 Body mass index is 22.62 kg/m. 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 He then had a R VATS with decortication, drainage and esophageal stent 08/20/23. He had multiple J tube exchanges that decreased leakage. 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 He was discharged 4/28 but then readmitted 5/2 with failure to thrive, increased J tube leakage, poor po intake, and milky output from chest tube. Seth Burch TF was restarted 5/9 and was tolerating but started developing abdominal pressure/cramping and loose stools around 5/12. TF was turned off 5/13 PM per patient request. During this time chest tube output continued to be milky white but slowed. 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 Per patient, abdomen feels "terrible" with a lot of bloating and pressure. Patient having a lot of flatus and loose stools with a light tan color close to the color of his tube feeds. Pharmacy consulted for TPN to supplement TF.  Per IV team, ok to use port for TPN.  Glucose/Insulin : A1c 7.1% - CBGs <180. Used 18 units SSI yesterday. Lantus  25 units daily and 30 units insulin  in TPN.  Electrolytes: Na 134. Lytes wnl Renal: SCr 1.34 (BL 1.3), BUN 58 Hepatic: ALP 170, AST 20, ALT 7; tbili WNL, albumin  1.9, TG 123 Intake / Output; MIVF: UOP 900 ml, JP drain 15 mL GI Imaging: 5/9 KUB: nonobstructive bowel gas pattern 5/21 CT chest/abd: Small amount of debris and fluid is seen surrounding  the esophagus at the level of the anastomotic leak, however, this appears improved since prior examination. No evidence of intestinal obstruction. GI Surgeries / Procedures:  5/5 EGD stent removal, inspect lumen, new stent placed   Central access: port and double lumen PICC placed 10/03/23 TPN start date: 10/03/23  Nutritional Goals: Goal TPN rate is 90 mL/hr (provides 130g AA and 2290 kCal per day)  RD Estimated Needs Total Energy Estimated Needs: 2200-2400 kcal Total Protein Estimated Needs: 115-130 gm Total Fluid Estimated Needs: >2L/day  Current Nutrition:  TPN at 50 ml/hr provides 1150 kcal and 66 g protein Vivonex RTF at 45 ml/hr provides 1080 kcal and 54 gm protein  Plan:  Cycle TPN over 18 hours starting tonight -taper up 1hr; taper down 1hr  Continue TPN and Vivonex at 45 ml/hr per surgery to meet 100% of needs Electrolytes in TPN: Na 25 mEq/L, K 45 mEq/L, Ca 5 mEq/L, Mg 5 mEq/L, Phos 20 mmol/L, max acetate  No standard MVI and trace elements in TPN (PO multivitamin daily) Continue resistant SSI Q4H  Continue Semglee  at 25 units daily (to account for TF) Continue 30 units insulin  in TPN Monitor TPN labs on Mon/Thurs  Oralee Billow, PharmD, BCCCP Clinical Pharmacist 10/13/2023 7:03 AM

## 2023-10-13 NOTE — TOC Progression Note (Signed)
 Transition of Care (TOC) - Progression Note  Reed Canes, BSN Transitions of Care Unit 4E- RN Case Manager See Treatment Team for direct phone #   Patient Details  Name: Seth Burch MRN: [id number] Date of Birth: [birth year]  Transition of Care [facility]) CM/SW Contact  Jearld Min Myrtle Atta, RN Phone Number: 10/13/2023, 12:30 PM  Clinical Narrative:    Following to coordinate care with anticipated discharge for tomorrow. CM has spoken with [facility] liaison who is coordinating home TPN needs. Liaison has spoken with wife and will plan to do education with wife tomorrow on home TPN.  Pt was active with [facility] for HHRN/PT/OT prior to admit- new orders have been placed to resume HH needs.  CM has reached out to [place] liaison to coordinate [facility]- awaiting return call to see if they can do RN visit tomorrow for TPN needs. Pam w/ [facility] is also coordinating with [facility] liaison.   Pt has tube feeds at home and supplies that were shipped to home previously 3/31 and 5/1- Pt will continue on same tube feed formula at this time.   TOC will follow up in the am to finalize home transition needs.    Expected Discharge Plan: Home w Home Health Services Barriers to Discharge: Continued Medical Work up  Expected Discharge Plan and Services   Discharge Planning Services: CM Consult Post Acute Care Choice: Home Health, Resumption of Svcs/PTA Provider Living arrangements for the past 2 months: Single Family Home                                       Social Determinants of Health (SDOH) Interventions SDOH Screenings   Food Insecurity: No Food Insecurity (09/20/2023)  Housing: Low Risk  (09/20/2023)  Transportation Needs: No Transportation Needs (09/20/2023)  Utilities: Not At Risk (09/20/2023)  Depression (PHQ2-9): Low Risk  (06/12/2023)  Social Connections: Socially Integrated (10/03/2023)  Tobacco Use: Low Risk  (09/25/2023)    Readmission Risk Interventions     09/15/2023   12:04 PM  Readmission Risk Prevention Plan  Transportation Screening Complete  PCP or Specialist Appt within 5-7 Days Complete  Home Care Screening Complete  Medication Review (RN CM) Complete

## 2023-10-13 NOTE — Plan of Care (Signed)
  Problem: Clinical Measurements: Goal: Will remain free from infection Outcome: Progressing   Problem: Clinical Measurements: Goal: Diagnostic test results will improve Outcome: Progressing   Problem: Clinical Measurements: Goal: Respiratory complications will improve Outcome: Progressing   Problem: Coping: Goal: Level of anxiety will decrease Outcome: Progressing   Problem: Safety: Goal: Ability to remain free from injury will improve Outcome: Progressing   Problem: Coping: Goal: Ability to adjust to condition or change in health will improve Outcome: Progressing   Problem: Fluid Volume: Goal: Ability to maintain a balanced intake and output will improve Outcome: Progressing   Problem: Metabolic: Goal: Ability to maintain appropriate glucose levels will improve Outcome: Progressing   Problem: Nutritional: Goal: Maintenance of adequate nutrition will improve Outcome: Progressing Goal: Progress toward achieving an optimal weight will improve Outcome: Progressing   Problem: Skin Integrity: Goal: Risk for impaired skin integrity will decrease Outcome: Progressing   Problem: Tissue Perfusion: Goal: Adequacy of tissue perfusion will improve Outcome: Progressing

## 2023-10-13 NOTE — Progress Notes (Signed)
 Occupational Therapy Treatment Patient Details Name: Seth Burch MRN: [id number] DOB: 11/10/1955 Today's Date: 10/13/2023   History of present illness 68 y.o. male [facility]19[facility]23 after follow-up with Dr.Lightfoot with failure to thrive, low O2 sats/SOB and milky-output of chest tube. Pt with chylothorax, esophageal anastomotic leak, and J tube bilious drainage. EGD on 5/5 with stent removal and inspection of esophageal lumen revealed persistent perforation. New esoph. stent placed. Seth Burch Extensive prior admits 3/10-4/1 and 4/1-4/28. 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 Patient began having a coughing episode before attempting bed mobility and required increased time before he settled down and was able to perform supine to sitting on EOB with supervision. Patient able to stand from EOB and perform peri area bathing with min assist. Patient declined standing at sink for self care tasks due to patient afraid of having another coughing episode and performed grooming tasks seated in recliner. Discharge recommendations continue to be appropriate. Acute OT to continue to follow to address established goals.       If plan is discharge home, recommend the following:  A little help with walking and/or transfers;A little help with bathing/dressing/bathroom;Assistance with cooking/housework;Assist for transportation   Equipment Recommendations  None recommended by OT    Recommendations for Other Services      Precautions / Restrictions Precautions Precautions: Fall Recall of Precautions/Restrictions: Intact Precaution/Restrictions Comments: L UQ jejunostomy, Rt JP drain Restrictions Weight Bearing Restrictions Per Provider Order: No       Mobility Bed Mobility Overal bed mobility: Needs Assistance Bed  Mobility: Supine to Sit     Supine to sit: Supervision     General bed mobility comments: increased time to initiate bed mobility due to coughing episode    Transfers Overall transfer level: Needs assistance Equipment used: Rolling walker (2 wheels) Transfers: Sit to/from Stand, Bed to chair/wheelchair/BSC Sit to Stand: Contact guard assist     Step pivot transfers: Contact guard assist     General transfer comment: cues for hand placement and safety     Balance Overall balance assessment: Needs assistance Sitting-balance support: Feet supported, Bilateral upper extremity supported Sitting balance-Leahy Scale: Fair Sitting balance - Comments: EOB   Standing balance support: Bilateral upper extremity supported, No upper extremity supported Standing balance-Leahy Scale: Fair Standing balance comment: stood from EOB for peri area bathing before transfer to recliner                           ADL either performed or assessed with clinical judgement   ADL Overall ADL's : Needs assistance/impaired Eating/Feeding: NPO   Grooming: Wash/dry hands;Wash/dry face;Oral care;Set up;Sitting       Lower Body Bathing: Minimal assistance;Sit to/from stand Lower Body Bathing Details (indicate cue type and reason): peri area bathing while standing from EOB                       General ADL Comments: increased time to get OOB due to coughing episode    Extremity/Trunk Assessment              Vision       Perception     Praxis     Communication Communication Communication: No apparent difficulties   Cognition Arousal: Alert Behavior During Therapy: Flat affect Cognition: No apparent  impairments     Awareness: Online awareness intact Memory impairment (select all impairments): Working memory Attention impairment (select first level of impairment): Divided attention Executive functioning impairment (select all impairments): Problem solving                    Following commands: Intact Following commands impaired: Follows one step commands inconsistently, Follows one step commands with increased time      Cueing   Cueing Techniques: Verbal cues  Exercises      Shoulder Instructions       General Comments VSS on RA    Pertinent Vitals/ Pain       Pain Assessment Pain Assessment: Faces Faces Pain Scale: Hurts little more Pain Location: general aching, coughing Pain Descriptors / Indicators: Aching Pain Intervention(s): Monitored during session, Repositioned  Home Living                                          Prior Functioning/Environment              Frequency  Min 2X/week        Progress Toward Goals  OT Goals(current goals can now be found in the care plan section)  Progress towards OT goals: Progressing toward goals  Acute Rehab OT Goals Patient Stated Goal: to feel better OT Goal Formulation: With patient Time For Goal Achievement: 10/21/23 Potential to Achieve Goals: Good ADL Goals Pt Will Perform Grooming: with modified independence;standing Pt Will Perform Lower Body Bathing: with modified independence;sit to/from stand;sitting/lateral leans Pt Will Perform Lower Body Dressing: with modified independence;sit to/from stand;sitting/lateral leans Pt Will Transfer to Toilet: with modified independence;ambulating;regular height toilet Pt Will Perform Toileting - Clothing Manipulation and hygiene: with modified independence;sitting/lateral leans;sit to/from stand Additional ADL Goal #1: Patient will be able to complete functional task in standing for 5 minutes prior to fatigue in order to promote increased activity tolerance.  Plan      Co-evaluation                 AM-PAC OT "6 Clicks" Daily Activity     Outcome Measure   Help from another person eating meals?: Total (NPO) Help from another person taking care of personal grooming?: A Little Help from another  person toileting, which includes using toliet, bedpan, or urinal?: A Little Help from another person bathing (including washing, rinsing, drying)?: A Lot Help from another person to put on and taking off regular upper body clothing?: A Little Help from another person to put on and taking off regular lower body clothing?: A Lot 6 Click Score: 14    End of Session Equipment Utilized During Treatment: Gait belt;Rolling walker (2 wheels)  OT Visit Diagnosis: Muscle weakness (generalized) (M62.81) Pain - part of body:  (general aching)   Activity Tolerance Patient tolerated treatment well   Patient Left in chair;with call bell/phone within reach   Nurse Communication Mobility status        Time: 1610-9604 OT Time Calculation (min): 36 min  Charges: OT General Charges $OT Visit: 1 Visit OT Treatments $Self Care/Home Management : 8-22 mins $Therapeutic Activity: 8-22 mins  Seth Burch, OTA Acute Rehabilitation Services  Office [phone]   Seth Burch 10/13/2023, 12:32 PM

## 2023-10-13 NOTE — Discharge Summary (Signed)
 Physician Discharge Summary  Patient ID: Seth Burch MRN: [id number] DOB/AGE: 1955/09/07 68 y.o.  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  He underwent Robotic Assisted Esophagectomy by Dr. Deloise Ferries on 07/28/2023.  He has suffered a long and complicated recovery course.  He was initially discharged home on 08/19/2023.  However, he presented to the Emergency Department later that same day due to increased pain after eating.  CT scan obtained in the ED confirmed a right hydropneumothorax, gas collection with disruption of anastomotic repair most consistent with esophageal.  Also at that time he had elevated creatinine level of 1.9.  He was admitted, made NPO, IV hydration, and broad spectrum antibiotics.  He also suffered large amount of leakage around his J Tube during that admission.  He was taken to the operating room an underwent Right VATS with decortication, drainage of effusion, and EGD with esophageal stent placement.  He underwent multiple J tube exchanges which ultimately decreased the amount of leakage. 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  He also underwent IR coil and glue emobolization of his thoracic duct.  Finally he had  significant drainage from his right chest tube site.  He was felt stable for discharge by Dr. Deloise Ferries on 09/15/2023.  He presented for follow up with Dr. Deloise Ferries on 09/19/2023 at which time he was noted to have low oxygen saturations of 85%.  He was also felt to be experiencing failure to thrive.  His chest tube remained in place and was still showing milky output of 150.  It was felt he would warrant re-admission to the hospital.  Initially the patient did okay at home.  However about 48-72 hours after discharge he developed severe coughing episodes which would last so long he was struggling to breath.  Overall his chest tube output had been minimal.  However today he has dumped about 200 cc of greenish fluid.  Also of note they stated the J tube drainage significantly increased over the past few days and he just soaks through dressings.  He had not been taking his insulin  and was having sugars over 500, they stated he took it today.  Oral intake has really remained poor, especially after development of his coughing episodes.     Hospital Course:  The patient was admitted to the hospital for our office. He was treated with Tussionex which he felt made the cough worse.  This was stopped and Tessalon  was started which was also not tolerated.  He was coughing up extremely thick sputum.  Robitussin DM was started.   Chest tube output was high.  His chest xray overall looks good without evidence of pleural effusion.  His lab work showed no evidence of leukocytosis.  He was noted to have low phosphorus and IV supplementation was ordered.  His blood sugars remained elevated and Semeglee was increased until control could be obtained.  The patient  was taken to the endoscopy suite on 09/22/23 where EGD was performed. The esophageal stent was removed revealing persistent perforation of the esophagus.   A new stent was placed and nutritional support resumed tube feeding by way of the jejunostomy tube.  The white chylous  drainage from the right chest tube gradually decreased but he continued to have drainage around the jejunostomy tube.  Careful attention was given to wound care at the jejunostomy exit site to minimize skin breakdown.  Supportive care continued.  On 09/30/2023 follow-up labs revealed his albumin  had improved from 1.7-2.0.  He also was noted on that date to have worsening renal function with his creatinine rising from 1.3-1.8 over a 6-hour period.  He was hyperkalemic with potassium of 5.8.  He was given a single dose of Lokelma  and follow-up serum potassium had normalized.  On the same date, his white blood count had increased to 24,000.  Follow-up chest x-ray showed improvement in the right basilar airspace densities seen on previous films.  There were no other areas of concern on the chest x-ray.  Urinalysis showed no evidence of UTI.  Sputum cultures were obtained showing Staph Aureus.  Right was started on empiric Zosyn  and vancomycin .  He remained afebrile.  His white count improved substantially over the next 24 hours as did his tachycardia.  As of 05/17, he was on Vancomycin , Flagyl , and Cefepime . Will continue the empiric antibiotics but when his renal function worsened slightly with a creatinine of 1.9, we changed the Zosyn  to cefepime  and Flagyl .  The vancomycin  was continued as well.  Sputum culture showed Staph Aureus (sensitive to Vancomycin ). We observed that [name] was able to tolerate tube feeding at lower rates but when the tube feeding actually increasing rates to find a point where his nutritional needs could be met optimally.`After discussion with RD, he was started on TPN and TFs were slowly increased to try to optimize nutritional status.  His blood sugars increased with resumption of tube feedings.  Pharmacy is managing insulin  regimen.  CT scan of chest and abdomen was obtained and showed incomplete apposition of the proximal portion of the stent graft with the wall of the esophagus.  He  was also noted to have accordion like compression of the small bowel due to antegrade migration of the retaining balloon of the jejunostomy tube.  It was felt a possible revision may be warranted.  However, Dr. Deloise Ferries reviewed CT scan and did not feel the findings were concerning and no further intervention would be required at this time.  He tolerated change in tube feeding regimen.  His diarrhea was much improved.  He continues to have significant issues with cough with some sputum production. He maintained good oxygenation on room air. The JP drain is in place and there is minimal daily drainage. His was tolerated 50% TPN and 50% tube feeds with vivonex. Outpatient TPN and tube feeds were arranged. His blood sugars were under better control and was educated on insulin  management at home. The patient was felt stable for discharge home per Dr. Deloise Ferries on 10/14/23.    Consults: None  Significant Diagnostic Studies:  CLINICAL DATA:  Abdominal pain, malabsorption diarrhea, esophageal anastomotic leak   EXAM: CT CHEST, ABDOMEN, AND PELVIS WITH CONTRAST   TECHNIQUE: Multidetector CT imaging of the chest, abdomen and pelvis was performed following the standard protocol during bolus administration of intravenous contrast.   RADIATION DOSE REDUCTION: This exam was performed according to the departmental dose-optimization program which  includes automated exposure control, adjustment of the mA and/or kV according to patient size and/or use of iterative reconstruction technique.   CONTRAST:  75mL OMNIPAQUE  IOHEXOL  350 MG/ML SOLN   COMPARISON:  08/19/2023   FINDINGS: CT CHEST FINDINGS   Cardiovascular: No significant coronary artery calcification. Global cardiac size within normal limits. No pericardial effusion. Central pulmonary arteries are of normal caliber. Mild atherosclerotic calcification within the thoracic aorta. No aortic aneurysm. Right internal jugular chest port tip noted  within the superior right atrium. Left upper extremity PICC line has been placed with its tip within the superior right atrium.   Mediastinum/Nodes: Visualized thyroid is unremarkable. No pathologic thoracic adenopathy.   High density material and metallic coils are seen within the thoracic duct above the level of the aortic arch and there is high density material compatible with embolization glue extending within the more distal thoracic duct throughout its course extending into the superior retroperitoneum with dense embolization of the cisterna chyli.   Interval placement of a esophageal stent graft extending from the level of the aortic arch through the distal neo esophagus and encompassing the area of anastomotic leak. There is, however, incomplete apposition of the proximal portion of the stents with the wall of the esophagus. A small amount of gas and debris is seen surrounding the stent graft and extending into the right pleural space, however, the volume debris has significantly decreased in the interval since prior examination.   Lungs/Pleura: Interval right chest tube placement with evacuation of the multiloculated right pleural effusion. Minimal residual pleural fluid remains. There is pleural thickening and developing right basilar atelectasis/parenchymal scarring.   There is interval development of subtle peribronchial and centrilobular nodularity and developing consolidation at the left lung base suspicious for changes of aspiration. Bronchopneumonia could appear similarly. Small left pleural effusion is unchanged. No pneumothorax. No central obstructing lesion.   Musculoskeletal: No chest wall mass or suspicious bone lesions identified.   CT ABDOMEN PELVIS FINDINGS   Hepatobiliary: No focal liver abnormality is seen. No gallstones, gallbladder wall thickening, or biliary dilatation.   Pancreas: Unremarkable   Spleen: Unremarkable   Adrenals/Urinary Tract:  Adrenal glands are unremarkable. Multiple simple cortical and parapelvic cysts are seen within the left kidney for which no follow-up imaging is recommended. The kidneys are otherwise unremarkable. Bladder is circumferentially thick walled suggesting changes of chronic bladder outlet obstruction. The bladder is not distended.   Stomach/Bowel: Surgical changes esophagectomy and gastric pull-up are again identified. Percutaneous jejunostomy is seen within the left lower quadrant with accordion-like compression of the small bowel due to antegrade migration of the retaining balloon. No evidence of obstruction, however. Small volume fluid is seen within the rectal vault in keeping with a diarrheal state. The small and large bowel are otherwise unremarkable. Appendix normal. No free intraperitoneal gas or fluid.   Vascular/Lymphatic: Aortic atherosclerosis. No enlarged abdominal or pelvic lymph nodes.   Reproductive: Moderate prostatic hypertrophy   Other: Residual lipiodol is seen within the inguinal regions bilaterally related to lymphangiography. As noted above, glue embolization of the retroperitoneal lymphatics is seen within the superior retroperitoneum extending into the cisterna chyli and thoracic.   Musculoskeletal: No acute or significant osseous findings.   IMPRESSION: 1. Interval embolization of the thoracic duct extending through the cisterna chyli and extending into multiple superior retroperitoneal lymphatic tributaries. 2. Interval placement of a esophageal stent graft extending from the aortic arch through the distal neo esophagus and encompassing the area of anastomotic leak. There is, however,  incomplete apposition of the proximal portion of the stent graft with the wall of the esophagus. Small amount of debris and fluid is seen surrounding the esophagus at the level of the anastomotic leak, however, this appears improved since prior examination. 3. Interval  development of subtle peribronchial and centrilobular nodularity and developing consolidation at the left lung base suspicious for changes of aspiration. Bronchopneumonia could appear similarly. Stable small left pleural effusion. 4. Interval evacuation of multiloculated right pleural effusion with chest tube placement. Small residual pleural fluid remains. Developing pleural thickening and basilar scarring in keeping some degree of developing trapped lung. 5. Percutaneous jejunostomy within the left lower quadrant with accordion-like compression of the small bowel due to antegrade migration of the retaining balloon. No evidence of obstruction. Revision of the jejunostomy catheter may be warranted. 6. Small volume fluid within the rectal vault in keeping with a diarrheal state.   Aortic Atherosclerosis (ICD10-I70.0).     Electronically Signed   By: Worthy Heads M.D.   On: 10/08/2023 23:10   CLINICAL DATA:  Abdominal pain.   EXAM: ABDOMEN - 1 VIEW   COMPARISON:  Radiograph 09/26/2023   FINDINGS: No bowel dilatation to suggest obstruction. Air within nondilated colon. No significant formed stool in the colon. Again seen catheter projecting over the left abdomen, reportedly a jejunostomy tube. Again seen contrast in the lymphatic system.   IMPRESSION: No radiographic explanation for pain.  No obstruction.     Electronically Signed   By: Chadwick Colonel M.D.   On: 10/08/2023 15:51      Treatments: Surgery  09/22/2023   Patient:  Seth Burch Pre-Op Dx: History of Ivor Lewis esophagectomy Anastomotic leak malnutrition   Post-op Dx:  same Procedure: - Esophagogastroscopy - 125 mm covered esophageal stent placement - Esophagram with Omnipaque     Surgeon and Role:      * Lightfoot, Marinell Siad, MD - Primary   Anesthesia  general EBL:  0ml Blood Administration: none Specimen:  none     Counts: correct     Indications: 68yo male with history of a late  anastomotic leak following esophagectomy on 3/10.  On 4/2, he represented with an anastomotic leak which was covered with a stent.  It has been 4 weeks, and the comes today to assess healing.   Findings: Viable mucosa.  Necrotic tissue, at the anastomosis.  Esophagram reveal and ongoing leak.  After placement of the stent, there was no extravasation.     Operative Technique: After the risks, benefits and alternatives were thoroughly discussed, the patient was brought to the operative theatre.  Anesthesia was induced. The patient was prepped and draped in normal sterile fashion.  An appropriate surgical pause was performed, and pre-operative antibiotics were dosed accordingly.   The gastroscope was advanced through the oropharynx into the cervical esophagus under direct visualization.  The scope was passed into the stent.  It was grasped and removed.  The scope was advanced back into the esophagus, and necrotic tissue was noted at the anastomosis.  Esophagram did show an ongoing leak.  This area was marked on the skin with paper clips.    Next a Jag wire was passed through the gastroscope into the stomach with fluoroscopic guidance.  The esophageal stent was then placed over the wire, and positioned under fluoroscopy to cover the mucosal defect.     The patient tolerated the procedure without any immediate complications, and was transferred to the PACU in stable condition.   Marinell Siad  Lightfoot    09/25/2023 Patient:  Seth Burch Pre-Op Dx: Hx of aspiration Hx of esophagectomy   Post-op Dx:  same Procedure: Flexible bronchoscopy       Surgeon and Role:      * Lightfoot, Marinell Siad, MD - Primary   Anesthesia  general EBL:  none       Indications: 68yo male with chronic cough following esophagectomy.  He also has evidence of aspiration.  Toilet bronch performed.     Findings: Thin secretions.  Normal anatomy.   Operative Technique: The patient was brought to the endoscopy  suite.  After anesthesia was induced, and time out was performed.  The video fiberoptic bronchoscope was introduced via the endotracheal tube and a general inspection was performed which showed normal right and left lung anatomy Aspiration of the bilateral mainstems was completed to remove any remaining secretions.  The patient tolerated the procedure without any immediate complications, and was taken to the PACU in stable condition.   Harrell Ala Alice  PATHOLOGY:  None this admission  Discharge Exam: Blood pressure 106/68, pulse 88, temperature 97.9 F (36.6 C), temperature source Oral, resp. rate 18, height 5\' 10"  (1.778 m), weight 71.4 kg, SpO2 100%.  General appearance: alert, cooperative, and no distress Neurologic: intact Heart: regular rate and rhythm Lungs: slightly diminished bibasilar breath sounds Abdomen: soft, non-tender; bowel sounds normal Extremities: thin, well perfused, no cyanosis or edema Wound: JP drain with minimal serous/white tinged drainage, macerated skin around j tube site is stable, minimal drainage around the j tube this AM  Disposition:  Discharged to home with home health services.  Discharge Instructions     Ambulatory referral to Home Health   Complete by: As directed    TPN per [facility] protocol for labs and dosing   Does the patient have Medicare or Medicaid?: No   The encounter with the patient was in whole, or in part, for the following medical condition, which is the primary reason for home health care: s/p esophagectomy   Reason for Medically Necessary Home Health Services: Skilled Nursing- Lab Monitoring Requiring Frequent Medication Adjustment   My clinical findings support the need for the above services: OTHER SEE COMMENTS   I certify that, based on my findings, the following services are medically necessary home health services: Nursing   Further, I certify that my clinical findings support that this patient is homebound due to: Unable to  leave home safely without assistance      Allergies as of 10/14/2023   No Known Allergies      Medication List     STOP taking these medications    Jardiance 10 MG Tabs tablet Generic drug: empagliflozin   Lantus  SoloStar 100 UNIT/ML Solostar Pen Generic drug: insulin  glargine   medium chain triglycerides  oil Commonly known as: MCT OIL   omeprazole 40 MG capsule Commonly known as: PRILOSEC   ramipril 10 MG capsule Commonly known as: ALTACE   tamsulosin  0.4 MG Caps capsule Commonly known as: FLOMAX    thiamine  100 MG tablet Commonly known as: VITAMIN B1       TAKE these medications    acetaminophen  325 MG tablet Commonly known as: TYLENOL  Place 2 tablets (650 mg total) into feeding tube every 6 (six) hours as needed for mild pain (pain score 1-3) (or Fever >/= 101). What changed:  how much to take how to take this when to take this reasons to take this   amLODipine  10 MG tablet Commonly known  as: NORVASC  Place 1 tablet (10 mg total) into feeding tube daily. What changed: how to take this   amoxicillin-clavulanate 600-42.9 MG/5ML suspension Commonly known as: AUGMENTIN Place 5 mLs (600 mg total) into feeding tube 2 (two) times daily for 5 days.   atorvastatin  20 MG tablet Commonly known as: LIPITOR Place 1 tablet (20 mg total) into feeding tube daily. What changed:  how to take this when to take this   chlorpheniramine-HYDROcodone 10-8 MG/5ML Commonly known as: TUSSIONEX Place 5 mLs into feeding tube every 12 (twelve) hours as needed for cough.   doxazosin  2 MG tablet Commonly known as: CARDURA  Place 1 tablet (2 mg total) into feeding tube daily.   escitalopram  5 MG tablet Commonly known as: LEXAPRO  Place 1 tablet (5 mg total) into feeding tube daily.   famotidine  20 MG tablet Commonly known as: PEPCID  Place 1 tablet (20 mg total) into feeding tube daily.   feeding supplement (PROSource TF20) liquid Place 60 mLs into feeding tube 2 (two)  times daily.   fiber Pack packet Place 1 packet into feeding tube 2 (two) times daily.   folic acid  1 MG tablet Commonly known as: FOLVITE  Place 1 tablet (1 mg total) into feeding tube daily.   free water  Soln Place 250 mLs into feeding tube in the morning, at noon, in the evening, and at bedtime.   Gerhardt's butt cream Crea Apply 1 Application topically 3 (three) times daily.   insulin  aspart 100 UNIT/ML injection Commonly known as: novoLOG  Inject 0-20 Units into the skin every 4 (four) hours.   insulin  glargine-yfgn 100 UNIT/ML injection Commonly known as: SEMGLEE  Inject 0.25 mLs (25 Units total) into the skin daily.   liver oil-zinc  oxide 40 % ointment Commonly known as: DESITIN Apply topically 3 (three) times daily as needed for irritation.   metoprolol  tartrate 25 MG tablet Commonly known as: LOPRESSOR  Place 1 tablet (25 mg total) into feeding tube 2 (two) times daily. What changed: how to take this   multivitamin with minerals Tabs tablet Place 1 tablet into feeding tube daily. What changed: how to take this   ondansetron  4 MG tablet Commonly known as: ZOFRAN  Place 1 tablet (4 mg total) into feeding tube every 6 (six) hours as needed for nausea.   oxyCODONE  5 MG immediate release tablet Commonly known as: Oxy IR/ROXICODONE  Place 1 tablet (5 mg total) into feeding tube every 6 (six) hours as needed for moderate pain (pain score 4-6). Do not take if the Tussionex (medication for cough) has been taken within the previous 6 hours. What changed:  how to take this when to take this reasons to take this additional instructions   saccharomyces boulardii 250 MG capsule Commonly known as: FLORASTOR Place 1 capsule (250 mg total) into feeding tube 2 (two) times daily. What changed: how to take this   simethicone  40 MG/0.6ML drops Commonly known as: MYLICON Place 1.2 mLs (80 mg total) into feeding tube 4 (four) times daily as needed (for bloating, or excessive  gas.).   thiamine  100 MG tablet Commonly known as: Vitamin B-1 Place 1 tablet (100 mg total) into feeding tube daily.   Vivonex RTF Liqd Take 45 mLs by mouth every hour. What changed:  how much to take how to take this when to take this        Follow-up Information     [facility] (Tube Feed) team for home Follow up.   Why: Please contact them to reorder each month or for  any questions about home tube feed supplies/billing/coverage Contact information: [phone]        [facility] Follow up.   Why: Lennart Quitter) HHRN/PT/OT- services to resume Contact information: [address] [place] [address] [phone]         [facility] Follow up.   Why: Home TPN arranged- they will contact and provide bedside education prior to discharge Contact information: [address], [place], [place] [address] Phone: [phone]        [facility] Follow up.   Why: Already have pump/IV pole, tube feed supplies (delivered 3/31 and 5/1) Contact information: [phone]- call when you need to re-order tube feed supplies each month        Hilarie Lovely, MD. Call on 10/17/2023.   Specialty: Cardiothoracic Surgery Why: This is a telephone visit at around 3:30pm.  Do not go to the office. Dr. Deloise Ferries will call you. Contact information: [address] [place] [address] [phone]                 Signed: Leata Providence, PA-C  10/14/2023, 10:29 AM

## 2023-10-13 NOTE — Progress Notes (Addendum)
      [address]       [place] [address]             [phone]      18 Days Post-Op Procedure(s) (LRB): BRONCHOSCOPY, FLEXIBLE (N/A) Subjective: Patient states he thinks he had 1 episode of diarrhea last night. He also states his cough is better this morning but he had a bad day coughing up a lot of green sputum yesterday.  Objective: Vital signs in last 24 hours: Temp:  [98 F (36.7 C)-98.6 F (37 C)] 98.6 F (37 C) (05/26 0313) Pulse Rate:  [79-95] 85 (05/26 0313) Cardiac Rhythm: Normal sinus rhythm (05/26 0700) Resp:  [15-22] 20 (05/26 0313) BP: (95-118)/(65-70) 102/67 (05/26 0313) SpO2:  [94 %-98 %] 96 % (05/26 0313) Weight:  [71.5 kg] 71.5 kg (05/26 0141)  Hemodynamic parameters for last 24 hours:    Intake/Output from previous day: 05/25 0701 - 05/26 0700 In: 508.8 [I.V.:508.8] Out: 915 [Urine:900; Drains:15] Intake/Output this shift: No intake/output data recorded.  General appearance: alert, cooperative, and no distress Neurologic: intact Heart: regular rate and rhythm, S1, S2 normal, no murmur, click, rub or gallop Lungs: slightly diminished bibasilar breath sounds Abdomen: soft, non-tender; bowel sounds normal; no masses,  no organomegaly Extremities: extremities normal, atraumatic, no cyanosis or edema Wound: JP drain with minimal serous/white tinged drainage, macerated skin around j tube, minimal drainage around the j tube this AM  Lab Results: No results for input(s): "WBC", "HGB", "HCT", "PLT" in the last 72 hours. BMET:  Recent Labs    10/10/23 0959 10/12/23 1045  NA 135 137  K 3.7 4.3  CL 107 106  CO2 23 22  GLUCOSE 195* 128*  BUN 58* 60*  CREATININE 1.41* 1.38*  CALCIUM  8.3* 8.5*    PT/INR: No results for input(s): "LABPROT", "INR" in the last 72 hours. ABG    Component Value Date/Time   PHART 7.4 07/29/2023 0514   HCO3 25.8 08/02/2023 2006   TCO2 17 (L) 08/19/2023 2022   ACIDBASEDEF 1.0 08/02/2023 1506   O2SAT  94.5 08/02/2023 2006   CBG (last 3)  Recent Labs    10/12/23 2041 10/12/23 2335 10/13/23 0317  GLUCAP 135* 121* 130*    Assessment/Plan: S/P Procedure(s) (LRB): BRONCHOSCOPY, FLEXIBLE (N/A)  CV: NSR, HR 80s. SBP 95-114. On Norvasc  and Lopressor   Pulm: Saturating well on RA. Continues to cough up thick green sputum and signs of aspiration on CT. JP drain output 15cc/24hrs, serous/white tinged drainage.   GI: NPO, goal is for 50/50 tube feed/TPN. Diarrhea improved. J tube with minimal drainage but macerated skin surrounding. Gerhardts cream and dry dressing in place. CT scan 05/21 with nothing concerning per Dr. Deloise Ferries.   Endo: CBGs much better controlled 135/121/130. Pharmacy managing insulin  regimen.  Renal: AKI on CKD, yesterday's Cr 1.38, improved from 1.41. UO 900cc/24hrs. Was having difficulty with urinary retention due to BPH, now on Cardura  and Proscar . No complaints this AM. Per pharmacy Proscar  cannot be crushed and given per tube and no alternatives were found so we will d/c.   ID: On Ancef  for staph aureus in sputum, no new labs this AM. Afebrile.   DVT Prophylaxis: Lovenox   Deconditioning: Continue to work with PT/OT. HH PT/OT ordered.   Dispo: Looks like Dr. Raina Bunting plan is for possible d/c tomorrow   LOS: 24 days    Randa Burton, PA-C 10/13/2023  Agree Dispo planning  Hilarie Lovely

## 2023-10-13 NOTE — Plan of Care (Signed)
  Problem: Education: Goal: Knowledge of General Education information will improve Description: Including pain rating scale, medication(s)/side effects and non-pharmacologic comfort measures Outcome: Progressing   Problem: Health Behavior/Discharge Planning: Goal: Ability to manage health-related needs will improve Outcome: Progressing   Problem: Clinical Measurements: Goal: Ability to maintain clinical measurements within normal limits will improve Outcome: Progressing Goal: Will remain free from infection Outcome: Progressing Goal: Cardiovascular complication will be avoided Outcome: Progressing   Problem: Activity: Goal: Risk for activity intolerance will decrease Outcome: Progressing   Problem: Pain Managment: Goal: General experience of comfort will improve and/or be controlled Outcome: Progressing

## 2023-10-14 ENCOUNTER — Encounter: Payer: Self-pay | Admitting: Oncology

## 2023-10-14 ENCOUNTER — Other Ambulatory Visit (HOSPITAL_COMMUNITY): Payer: Self-pay

## 2023-10-14 LAB — RENAL FUNCTION PANEL
Albumin: 1.8 g/dL — ABNORMAL LOW (ref 3.5–5.0)
Anion gap: 10 (ref 5–15)
BUN: 63 mg/dL — ABNORMAL HIGH (ref 8–23)
CO2: 22 mmol/L (ref 22–32)
Calcium: 8.4 mg/dL — ABNORMAL LOW (ref 8.9–10.3)
Chloride: 104 mmol/L (ref 98–111)
Creatinine, Ser: 1.43 mg/dL — ABNORMAL HIGH (ref 0.61–1.24)
GFR, Estimated: 54 mL/min — ABNORMAL LOW (ref >60)
Glucose, Bld: 130 mg/dL — ABNORMAL HIGH (ref 70–99)
Phosphorus: 4 mg/dL (ref 2.5–4.6)
Potassium: 4.4 mmol/L (ref 3.5–5.1)
Sodium: 136 mmol/L (ref 135–145)

## 2023-10-14 LAB — CBC
HCT: 27.3 % — ABNORMAL LOW (ref 39.0–52.0)
Hemoglobin: 8.6 g/dL — ABNORMAL LOW (ref 13.0–17.0)
MCH: 29.3 pg (ref 26.0–34.0)
MCHC: 31.5 g/dL (ref 30.0–36.0)
MCV: 92.9 fL (ref 80.0–100.0)
Platelets: 278 10*3/uL (ref 150–400)
RBC: 2.94 MIL/uL — ABNORMAL LOW (ref 4.22–5.81)
RDW: 20.8 % — ABNORMAL HIGH (ref 11.5–15.5)
WBC: 8.5 10*3/uL (ref 4.0–10.5)
nRBC: 0 % (ref 0.0–0.2)

## 2023-10-14 LAB — GLUCOSE, CAPILLARY
Glucose-Capillary: 114 mg/dL — ABNORMAL HIGH (ref 70–99)
Glucose-Capillary: 111 mg/dL — ABNORMAL HIGH (ref 70–99)
Glucose-Capillary: 147 mg/dL — ABNORMAL HIGH (ref 70–99)

## 2023-10-14 MED ORDER — HYDROCOD POLI-CHLORPHE POLI ER 10-8 MG/5ML PO SUER
5.0000 mL | Freq: Two times a day (BID) | ORAL | 0 refills | Status: DC | PRN
  Filled 2023-10-14: qty 200, 20d supply, fill #0

## 2023-10-14 MED ORDER — TRAVASOL 10 % IV SOLN
INTRAVENOUS | Status: DC
  Filled 2023-10-14: qty 660

## 2023-10-14 MED ORDER — INSULIN ASPART 100 UNIT/ML IJ SOLN
0.0000 [IU] | INTRAMUSCULAR | 11 refills | Status: DC
Start: 2023-10-14 — End: 2023-10-14
  Filled 2023-10-14: qty 10, 9d supply, fill #0

## 2023-10-14 MED ORDER — INSULIN LISPRO (1 UNIT DIAL) 100 UNIT/ML (KWIKPEN)
0.0000 [IU] | PEN_INJECTOR | SUBCUTANEOUS | 11 refills | Status: DC
  Filled 2023-10-14: qty 15, 13d supply, fill #0

## 2023-10-14 MED ORDER — OXYCODONE HCL 5 MG PO TABS
5.0000 mg | ORAL_TABLET | Freq: Four times a day (QID) | ORAL | 0 refills | Status: DC | PRN
  Filled 2023-10-14: qty 28, 7d supply, fill #0

## 2023-10-14 MED ORDER — THIAMINE HCL 100 MG PO TABS
100.0000 mg | ORAL_TABLET | Freq: Every day | ORAL | 1 refills | Status: DC
  Filled 2023-10-14: qty 30, 30d supply, fill #0

## 2023-10-14 MED ORDER — ACETAMINOPHEN 325 MG PO TABS: 650.0000 mg | ORAL_TABLET | Freq: Four times a day (QID) | ORAL | Status: DC | PRN

## 2023-10-14 MED ORDER — FOLIC ACID 1 MG PO TABS
1.0000 mg | ORAL_TABLET | Freq: Every day | ORAL | 1 refills | Status: DC
  Filled 2023-10-14: qty 30, 30d supply, fill #0

## 2023-10-14 MED ORDER — DOXAZOSIN MESYLATE 2 MG PO TABS
2.0000 mg | ORAL_TABLET | Freq: Every day | ORAL | 1 refills | Status: DC
  Filled 2023-10-14: qty 30, 30d supply, fill #0

## 2023-10-14 MED ORDER — INSULIN PEN NEEDLE 32G X 4 MM MISC
0 refills | Status: DC
  Filled 2023-10-14: qty 200, 28d supply, fill #0

## 2023-10-14 MED ORDER — FAMOTIDINE 20 MG PO TABS
20.0000 mg | ORAL_TABLET | Freq: Every day | ORAL | 1 refills | Status: DC
  Filled 2023-10-14: qty 30, 30d supply, fill #0

## 2023-10-14 MED ORDER — ESCITALOPRAM OXALATE 5 MG PO TABS
5.0000 mg | ORAL_TABLET | Freq: Every day | ORAL | 1 refills | Status: DC
  Filled 2023-10-14: qty 30, 30d supply, fill #0

## 2023-10-14 MED ORDER — AMOXICILLIN-POT CLAVULANATE 600-42.9 MG/5ML PO SUSR
875.0000 mg | Freq: Two times a day (BID) | ORAL | 0 refills | Status: AC
  Filled 2023-10-14: qty 75, 5d supply, fill #0

## 2023-10-14 MED ORDER — METOPROLOL TARTRATE 25 MG PO TABS
25.0000 mg | ORAL_TABLET | Freq: Two times a day (BID) | ORAL | 1 refills | Status: DC
  Filled 2023-10-14: qty 60, 30d supply, fill #0

## 2023-10-14 MED ORDER — FREE WATER: 250.0000 mL | Freq: Four times a day (QID) | Status: DC

## 2023-10-14 MED ORDER — SACCHAROMYCES BOULARDII 250 MG PO CAPS
250.0000 mg | ORAL_CAPSULE | Freq: Two times a day (BID) | ORAL | 1 refills | Status: DC
  Filled 2023-10-14: qty 60, 30d supply, fill #0

## 2023-10-14 MED ORDER — ZINC OXIDE 40 % EX OINT
TOPICAL_OINTMENT | Freq: Three times a day (TID) | CUTANEOUS | 0 refills | Status: DC | PRN
  Filled 2023-10-14: qty 56.7, fill #0

## 2023-10-14 MED ORDER — ONDANSETRON HCL 4 MG PO TABS
4.0000 mg | ORAL_TABLET | Freq: Four times a day (QID) | ORAL | 0 refills | Status: DC | PRN
  Filled 2023-10-14: qty 20, 5d supply, fill #0

## 2023-10-14 MED ORDER — ADULT MULTIVITAMIN W/MINERALS CH
1.0000 | ORAL_TABLET | Freq: Every day | ORAL | 1 refills | Status: DC
  Filled 2023-10-14: qty 30, 30d supply, fill #0

## 2023-10-14 MED ORDER — SIMETHICONE 40 MG/0.6ML PO SUSP
80.0000 mg | Freq: Four times a day (QID) | ORAL | 0 refills | Status: DC | PRN
  Filled 2023-10-14: qty 30, 7d supply, fill #0

## 2023-10-14 MED ORDER — ATORVASTATIN CALCIUM 20 MG PO TABS
20.0000 mg | ORAL_TABLET | Freq: Every day | ORAL | 1 refills | Status: DC
  Filled 2023-10-14: qty 30, 30d supply, fill #0

## 2023-10-14 MED ORDER — INSULIN GLARGINE 100 UNIT/ML SOLOSTAR PEN
25.0000 [IU] | PEN_INJECTOR | Freq: Every day | SUBCUTANEOUS | 2 refills | Status: DC
  Filled 2023-10-14: qty 9, 36d supply, fill #0

## 2023-10-14 MED ORDER — AMLODIPINE BESYLATE 10 MG PO TABS
10.0000 mg | ORAL_TABLET | Freq: Every day | ORAL | 1 refills | Status: DC
  Filled 2023-10-14: qty 30, 30d supply, fill #0

## 2023-10-14 MED ORDER — VIVONEX RTF PO LIQD: 45.0000 mL | ORAL | Status: DC

## 2023-10-14 MED ORDER — INSULIN GLARGINE-YFGN 100 UNIT/ML ~~LOC~~ SOLN
25.0000 [IU] | Freq: Every day | SUBCUTANEOUS | 11 refills | Status: DC
  Filled 2023-10-14: qty 10, 40d supply, fill #0

## 2023-10-14 NOTE — TOC Transition Note (Addendum)
 Transition of Care [facility]) - Discharge Note Sherin Dingwall RN, BSN Transitions of Care Unit 4E- RN Case Manager See Treatment Team for direct phone #   Patient Details  Name: Seth Burch MRN: [id number] Date of Birth: 1955/12/29  Transition of Care [facility]) CM/SW Contact:  Rox Cope, RN Phone Number: 10/14/2023, 11:47 AM   Clinical Narrative:    Pt stable for transition home today, HH orders have been placed for resumption of HH w/ [facility]. CM has spoken with [facility] liaison and they plan to see pt tomorrow in the home.   Pt will go home with TPN and TF this time. Pam with [facility] home infusion coordinating for home TPN needs. She will meet with wife and daughter this afternoon around 4pm for TPN education and hook pt up to home TPN pump. Pt will then be able to d/c home after this.   CM spoke with daughter on 5/26- pt has all needed supplies for home TF including formula. They will call [employer] when more supplies needed. Cm has spoken with [employer] liaison- Mitch to let him know NO tube feed supplies needed at this time.  Spoke with Ashlei- will send updated TF order to reflect new TF rate.   Family to transport home.    Final next level of care: Home w Home Health Services Barriers to Discharge: Barriers Resolved   Patient Goals and CMS Choice Patient states their goals for this hospitalization and ongoing recovery are:: wants to return home and remain  independent CMS Medicare.gov Compare Post Acute Care list provided to:: Patient Choice offered to / list presented to : Patient, Spouse      Discharge Placement               Home w/ [facility]        Discharge Plan and Services Additional resources added to the After Visit Summary for     Discharge Planning Services: CM Consult Post Acute Care Choice: Home Health, Resumption of Svcs/PTA Provider                    HH Arranged: RN, PT [facility] Agency: [facility] Date [facility] Agency Contacted: 10/14/23 Time HH  Agency Contacted: 1146 Representative spoke with at [facility] Agency: Charlann Confer  Social Drivers of Health (SDOH) Interventions SDOH Screenings   Food Insecurity: No Food Insecurity (09/20/2023)  Housing: Low Risk  (09/20/2023)  Transportation Needs: No Transportation Needs (09/20/2023)  Utilities: Not At Risk (09/20/2023)  Depression (PHQ2-9): Low Risk  (06/12/2023)  Social Connections: Socially Integrated (10/03/2023)  Tobacco Use: Low Risk  (09/25/2023)     Readmission Risk Interventions    10/14/2023   11:46 AM 09/15/2023   12:04 PM  Readmission Risk Prevention Plan  Transportation Screening Complete Complete  PCP or Specialist Appt within 5-7 Days  Complete  Home Care Screening  Complete  Medication Review (RN CM)  Complete  Medication Review Oceanographer) Complete   HRI or Home Care Consult Complete   SW Recovery Care/Counseling Consult Complete   Palliative Care Screening Not Applicable   Skilled Nursing Facility Not Applicable

## 2023-10-14 NOTE — Plan of Care (Signed)
  Problem: Clinical Measurements: Goal: Will remain free from infection Outcome: Progressing   Problem: Clinical Measurements: Goal: Diagnostic test results will improve Outcome: Progressing   Problem: Clinical Measurements: Goal: Respiratory complications will improve Outcome: Progressing   Problem: Nutrition: Goal: Adequate nutrition will be maintained Outcome: Progressing   Problem: Coping: Goal: Level of anxiety will decrease Outcome: Progressing   Problem: Pain Managment: Goal: General experience of comfort will improve and/or be controlled Outcome: Progressing   Problem: Coping: Goal: Ability to adjust to condition or change in health will improve Outcome: Progressing   Problem: Metabolic: Goal: Ability to maintain appropriate glucose levels will improve Outcome: Progressing   Problem: Skin Integrity: Goal: Risk for impaired skin integrity will decrease Outcome: Progressing   Problem: Tissue Perfusion: Goal: Adequacy of tissue perfusion will improve Outcome: Progressing

## 2023-10-14 NOTE — Inpatient Diabetes Management (Signed)
 Inpatient Diabetes Program Recommendations  AACE/ADA: New Consensus Statement on Inpatient Glycemic Control (2015)  Target Ranges:  Prepandial:   less than 140 mg/dL      Peak postprandial:   less than 180 mg/dL (1-2 hours)      Critically ill patients:  140 - 180 mg/dL   Lab Results  Component Value Date   GLUCAP 147 (H) 10/14/2023   HGBA1C 7.1 (H) 07/24/2023   Inpatient Diabetes Program Recommendations:   Spoke with patient, wife and daughter regarding when to take Lantus  daily and Novolog  correction scale q 4 hrs. Reviewed hypoglycemia protocol and since patient is NPO will need Glucagon or Baqsimi prn.  Gave patient 3 extra Libre 3+ sensors and reviewed with daughter how to connect [place] view to her phone to be able to see patient's glucose levels. Patient has prescription for [facility] 3 that he has been using and has app downloaded on his phone.  Thank you, Stevie Ertle E. Sabree Nuon, RN, MSN, CDCES  Diabetes Coordinator Inpatient Glycemic Control Team Team Pager [phone] (8am-5pm) 10/14/2023 2:07 PM

## 2023-10-14 NOTE — Progress Notes (Signed)
 [facility] has filled pt's scripts, medications picked up from [facility] pharmacy and delivered to the pt's room and given to pt's wife and daughter. Wife informed which bag of meds needs to go into the refrigerator upon getting home today.    Rebel Laughridge,RN SWOT

## 2023-10-14 NOTE — Progress Notes (Signed)
 Physical Therapy Treatment Patient Details Name: Seth Burch MRN: [id number] DOB: 12-05-1955 Today's Date: 10/14/2023   History of Present Illness 68 y.o. male [facility] to [facility]19/23 after follow-up with Dr.Lightfoot with failure to thrive, low O2 sats/SOB and milky-output of chest tube. Pt with chylothorax, esophageal anastomotic leak, and J tube bilious drainage. EGD on 5/5 with stent removal and inspection of esophageal lumen revealed persistent perforation. New esoph. stent placed. [name] Extensive prior admits 3/10-4/1 and 4/1-4/28. 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 Pt demonstrating and verbalizing understanding of all HEP exercises as well as frequency and compliance. Pt educated on importance of continued mobility and appropriate activity progression post acutely with pt verbalizing understanding. Pt continues to benefit from skilled PT services to progress toward functional mobility goals.     If plan is discharge home, recommend the following: Assistance with cooking/housework;Assist for transportation;Help with stairs or ramp for entrance;A little help with walking and/or transfers;A little help with bathing/dressing/bathroom   Can travel by private vehicle        Equipment Recommendations  None recommended by PT    Recommendations for Other Services       Precautions / Restrictions Precautions Precautions: Fall Recall of Precautions/Restrictions: Intact Precaution/Restrictions Comments: L UQ jejunostomy, Rt JP drain Restrictions Weight Bearing Restrictions Per Provider Order: No     Mobility  Bed Mobility Overal bed mobility: Needs Assistance             General bed mobility comments: pt declining     Transfers                        Ambulation/Gait                   Stairs             Wheelchair Mobility     Tilt Bed    Modified Rankin (Stroke Patients Only)       Balance                                            Communication Communication Communication: No apparent difficulties Factors Affecting Communication: Difficulty expressing self  Cognition Arousal: Alert Behavior During Therapy: Flat affect   PT - Cognitive impairments: No apparent impairments                         Following commands: Intact Following commands impaired: Follows one step commands inconsistently, Follows one step commands with increased time    Cueing Cueing Techniques: Verbal cues  Exercises      General Comments General comments (skin integrity, edema, etc.): 2 HEPs provided for supine and sitting exercises and reviewed with pt, Access Code: [email or web address]  URL: [email or web address]  Date: 10/14/2023  Prepared by: Lucianne Rutherford    Exercises  - Seated Leg Press with Resistance  - 1 x daily - 7 x weekly - 3 sets - 10 reps  - Seated Knee Extension with Anchored Resistance  - 1 x daily - 7 x weekly - 3 sets - 10 reps  - Seated Knee Lifts with Resistance  -  1 x daily - 7 x weekly - 3 sets - 10 reps    Access Code: [email or web address]  URL: [email or web address]  Date: 10/14/2023  Prepared by: Lucianne Rutherford    Exercises  - Supine Bridge  - 1 x daily - 7 x weekly - 3 sets - 10 reps  - Supine Quad Set  - 1 x daily - 7 x weekly - 3 sets - 10 reps  - Supine Heel Slide  - 1 x daily - 7 x weekly - 3 sets - 10 reps  - Supine Gluteal Sets  - 1 x daily - 7 x weekly - 3 sets - 10 reps      Pertinent Vitals/Pain Pain Assessment Pain Assessment: Faces Faces Pain Scale: Hurts little more Pain Location: general aching, coughing Pain Descriptors / Indicators: Aching Pain Intervention(s): Monitored during session, Limited activity within patient's  tolerance    Home Living                          Prior Function            PT Goals (current goals can now be found in the care plan section) Acute Rehab PT Goals Patient Stated Goal: to go home PT Goal Formulation: With patient/family Time For Goal Achievement: 10/20/23    Frequency    Min 3X/week      PT Plan      Co-evaluation              AM-PAC PT "6 Clicks" Mobility   Outcome Measure  Help needed turning from your back to your side while in a flat bed without using bedrails?: A Little Help needed moving from lying on your back to sitting on the side of a flat bed without using bedrails?: A Little Help needed moving to and from a bed to a chair (including a wheelchair)?: A Little Help needed standing up from a chair using your arms (e.g., wheelchair or bedside chair)?: A Little Help needed to walk in hospital room?: A Little Help needed climbing 3-5 steps with a railing? : A Lot 6 Click Score: 17    End of Session   Activity Tolerance: Patient tolerated treatment well;Other (comment);Patient limited by fatigue (limited by coughing) Patient left: in bed;with call bell/phone within reach Nurse Communication: Mobility status PT Visit Diagnosis: Other abnormalities of gait and mobility (R26.89);Unsteadiness on feet (R26.81);Muscle weakness (generalized) (M62.81)     Time: 3086-5784 PT Time Calculation (min) (ACUTE ONLY): 17 min  Charges:    $Therapeutic Exercise: 8-22 mins PT General Charges $$ ACUTE PT VISIT: 1 Visit                     [name] R. PTA Acute Rehabilitation Services Office: [phone]   [name] 10/14/2023, 12:03 PM

## 2023-10-14 NOTE — Plan of Care (Signed)

## 2023-10-14 NOTE — Progress Notes (Signed)
 Nutrition Follow-up  DOCUMENTATION CODES:   Severe malnutrition in context of chronic illness  INTERVENTION:  Mofify TF via J-tube: Vivonex at 45ml/hr (1080ml per day)   TF at goal rate provides: 1080 kcals (49% of estimated minimum needs), 54g protein (47% of minimum estimated needs) and 916 ml per day   Continue to cycle supplemental TPN to meet rest of estimated needs To be managed by pharmacy   TPN at goal rate provides: 1145 kcals (52% of minimum estimated needs) and 65g protein (57% of minimum estimated needs)   TOTAL NUTRIENT INTAKE: 2225 kcals and 119g protein   Continue MVI with minerals daily  Continue nutrisource fiber BID to provide 3g soluble fiber per serving to aid in bulking of stool, if indicated Continue Florastor for gut health and to promote repletion of gut bacteria   NUTRITION DIAGNOSIS:  Severe Malnutrition related to chronic illness (esophageal cancer s/p chemo, radiation and surgery) as evidenced by severe muscle depletion, severe fat depletion, percent weight loss. - remains applicable  GOAL:  Patient will meet greater than or equal to 90% of their needs - meeting via TF/TPN regimen  MONITOR:  Diet advancement, TF tolerance, Weight trends, Labs, PO intake  REASON FOR ASSESSMENT:  Consult Enteral/tube feeding initiation and management  ASSESSMENT:  68 y.o Male with multiple readmissions due to complications after esophagectomy and J tube placement on 3/10 including, anastomosis leaks, dysphagia, chylothorax, FTT. Now presents with persistent coughing episodes, SOB, J tube drainage, esophageal anastomotic leak. PMH of DM, HTN, dyslipedemia, CKD, esophageal cancer, IgG kappa MGUS.  Previous Admission 07/28/2023-08/19/2023 3/10 EGD, XI Robotic Assisted Ivor Lewis Esophagectomy, J-tube placement 3/11 Trickle TF initiated 3/12 Surgery increased TF to 40 ml/hr 3/13 Surgery increased TF to 55 ml/hr, transferred to ICU  3/14 Surgery increased TF to 65 ml/hr  (goal), bowel regimen added and +BM 3/15 Intubated, Bronch (Thick mucus in trachea suctioned, partially occluding L mainstem, ?aspiration, recurrent false vocal cord irritation with swelling leading to stridor and resp failure) 3/17 Extubated 3/18 Esophogram:  Brisk tracheal aspiration of ingested contrast, into the LEFT mainstem bronchus. SLP eval recommended. Stagnant contrast at diaphragmatic hiatus. No anastomotic leak.  3/19 Diet advanced to Dysphagia 2/Thins, later downgraded to Nectar Thick Liquids 3/20 Transition to Nocturnal TF, family to bring protein shake in from home 3/21 Significant diarrhea overnight, (9 stools in 10 hours), significant J-tube drainage, change to Vivonex with day time feedings 3/24 Diet changed to Dysphagia 2, Thins per SLP 3/26 NPO due to concern for possible esophageal leak 3/27 advanced to Dysphagia 3, thin liquids  3/31 modify TF regimen for d/c 4/1 discharged home Previous Admission 08/19/2023-09/15/2023 4/2 admitted; OR: R VATS, EGD, and esophageal stent placement 4/3 trial Johny Nap TF formula during the day (16 hours), transfusion 4/4 advanced to goal rate; leaking J-tube, TF held  4/5 txr to cardiovascular ICU 4/7 TF re-initiated, transfused  4/9 J-tube replaced by IR 4/10 significant leaking from J-tube; TFs on hold  4/11 PICC placed, TPN initiated, J-tube replaced again, WOUND VAC 4/12 re-initiate trickles s/p second J-tube replacement 4/13 PICC line replaced 4/14 slowly advance TF goal rate 4/16 TF to goal rate; TPN discontinued 4/17 chylothorax: transition to Vivonex and very low fat diet 4/19 TF reduced due to feelings of fullness/diarrhea 4/21 TF rate increased to 44ml/hr 4/24 embolization of chyle leak 4/25 TF increased to 62ml/hr 4/28 discharged home Current Admission 5/2 Admitted Viovonex started @ 65 ml/hr, J tube drainage and chylothorax persists 5/5 EGD w/ esophageal  stent replacement 5/6 transfused 5/8  bronchoscopy: no acute  findings; evidence of aspiration 5/9 transitioned to [facility] 1.4 5/15 5 loose stools x24 hours 5/16 transition to [employer] 1.5 Peptide and supplemental TPN  5/18 TF on hold d/t abdominal pain and discomfort 5/19 TF d/c and TPN to meet 100% of needs 5/20 Re-start Vivonex + TPN (50/50) 5/26 TPN transitioned to cyclic schedule   CT output continues to improve with 10ml output documented yesterday. Tolerating current TF/Cyclic TPN regimen. Loose stool frequency has decreased. Coughed up large amount of green sputum yesterday, but reports it is improved today. May discharge today, per surgery.   Maceration of skin to J-tube site stable. Output improved. Abdomen soft, non-distended. Remains on Ancef  for staph aureus in sputum.    Admit Weight: 73.9kg Current Weight: 71.4kg Lowest Weight: 66.8kg on 5/17   Remains NPO. Weight beginning to trend up, from admission low on 5/17. Pt endorses feeling stronger.   Intake/Output Summary (Last 24 hours) at 10/14/2023 0954 Last data filed at 10/14/2023 0700 Gross per 24 hour  Intake 0 ml  Output 2310 ml  Net -2310 ml      Drains/Lines: LUQ: Jejunostomy (20 Fr) R Lateral Pleural (19Fr): 20ml x24 hours UOP: 2.5L  x24 hours  CBGs much better controlled with insulin  regimen being adjusted in TPN. Sodium also improving w/ correction of blood sugars. Crt has stabilized.   Meds: famotidine , folic acid ,  SSI 0-20 q4 hours, Semglee  25 daily, lactulose , Florastor,  MVI, thiamine , IV ABX   Labs:  Na+ 644>034>742 (wdl) K+ 4.4 (wdl) CBGs 108-130 x24 hours A1c 7.1 (07/2023)    Diet Order:   Diet Order             Diet NPO time specified  Diet effective now            EDUCATION NEEDS:  Education needs have been addressed  Skin:  Skin Assessment: Skin Integrity Issues: Incisions: R chest tube Other: MASD around J-tube  Last BM:  5/25  Height:  Ht Readings from Last 1 Encounters:  09/25/23 5\' 10"  (1.778 m)   Weight:  Wt Readings from  Last 1 Encounters:  10/14/23 71.4 kg   Ideal Body Weight:  75.5 kg  BMI:  Body mass index is 22.59 kg/m.  Estimated Nutritional Needs:   Kcal:  2200-2400 kcal  Protein:  115-130 gm  Fluid:  >2L/day  Con Decant MS, RD, LDN Registered Dietitian Clinical Nutrition RD Inpatient Contact Info in Amion

## 2023-10-14 NOTE — Progress Notes (Signed)
 Patient has been discharged, AVS instructions provided to his wife and daughter, wound care demonstrated and educated, PICC line flushed, was patent, tube feeding stopped.  Supplies provided for wound care and feeding. Hand no any concerns during discharge. CCMD notified, all the belongings taken by his family.

## 2023-10-14 NOTE — Progress Notes (Signed)
      [address]       Seth Burch [address]             [phone]       Subjective: Seth Burch he feels he is tolerating the current nutrition support with TPN + TF pretty well. Having less frequent loose stools. Said he feels stronger overall compared with last week.   Objective: Vital signs in last 24 hours: Temp:  [97.7 F (36.5 C)-98.2 F (36.8 C)] 98 F (36.7 C) (05/27 0309) Pulse Rate:  [78-92] 86 (05/27 0309) Cardiac Rhythm: Normal sinus rhythm (05/26 1900) Resp:  [20-26] 26 (05/27 0531) BP: (98-111)/(57-70) 109/57 (05/27 0309) SpO2:  [96 %-99 %] 97 % (05/27 0309) Weight:  [71.4 kg] 71.4 kg (05/27 0531)     Intake/Output from previous day: 05/26 0701 - 05/27 0700 In: 0  Out: 2510 [Urine:2500; Drains:10] Intake/Output this shift: No intake/output data recorded.  General appearance: alert, cooperative, and no distress Neurologic: intact Heart: regular rate and rhythm Lungs: slightly diminished bibasilar breath sounds Abdomen: soft, non-tender; bowel sounds normal Extremities: thin, well perfused, no cyanosis or edema Wound: JP drain with minimal serous/white tinged drainage, macerated skin around j tube site is stable, minimal drainage around the j tube this AM  Lab Results: No results for input(s): "WBC", "HGB", "HCT", "PLT" in the last 72 hours. BMET:  Recent Labs    10/13/23 0500 10/14/23 0651  NA 134* 136  K 4.3 4.4  CL 104 104  CO2 22 22  GLUCOSE 108* 130*  BUN 58* 63*  CREATININE 1.34* 1.43*  CALCIUM  8.4* 8.4*    PT/INR: No results for input(s): "LABPROT", "INR" in the last 72 hours. ABG    Component Value Date/Time   PHART 7.4 07/29/2023 0514   HCO3 25.8 08/02/2023 2006   TCO2 17 (L) 08/19/2023 2022   ACIDBASEDEF 1.0 08/02/2023 1506   O2SAT 94.5 08/02/2023 2006   CBG (last 3)  Recent Labs    10/13/23 2044 10/13/23 2310 10/14/23 0312  GLUCAP 135* 134* 114*    Assessment/Plan: S/P Procedure(s) (LRB): BRONCHOSCOPY,  FLEXIBLE (N/A)  -CV: NSR, HR 75-80s. BP stable on Norvasc  and Lopressor   -Pulm: Saturating well on RA. Continues to cough up thick green sputum but this is less frequent. JP drain output 15cc/24hrs, serous/white tinged drainage.   -GI: Remains NPO, goal is for 50/50 tube feed/TPN. Diarrhea improved. J tube with minimal drainage, stable maceration of skin surrounding the insertion site. Seth Burch's cream and dry dressing in place.   -Endo: CBG control reasonable. Pharmacy managing insulin  regimen.  -Renal: CKDwith creat near baseline. Was having difficulty with urinary retention due to BPH, now on Cardura  and Proscar . No complaints this AM. Per pharmacy Proscar  cannot be crushed and given per tube and no alternatives were found so we will d/c.   -ID: On Ancef  for staph aureus in sputum, no new labs this AM. Afebrile.   -DVT Prophylaxis: Lovenox   -Deconditioning: Continue to work with PT/OT. HH PT/OT ordered.   -Dispo: possible discharge to home with home health for continued nutritional support with TPN and TF.    LOS: 25 days    Seth Amiri G. Kweku Stankey, PA-C 10/14/2023

## 2023-10-14 NOTE — Progress Notes (Signed)
 PHARMACY - TOTAL PARENTERAL NUTRITION CONSULT NOTE  Indication: Intolerance to EN   Patient Measurements: Height: 5\' 10"  (177.8 cm) Weight: 71.4 kg (157 lb 6.5 oz) IBW/kg (Calculated) : 73 TPN AdjBW (KG): 73.9 Body mass index is 22.59 kg/m. 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 He then had a R VATS with decortication, drainage and esophageal stent 08/20/23. He had multiple J tube exchanges that decreased leakage. 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 He was discharged 4/28 but then readmitted 5/2 with failure to thrive, increased J tube leakage, poor po intake, and milky output from chest tube. Johny Nap TF was restarted 5/9 and was tolerating but started developing abdominal pressure/cramping and loose stools around 5/12. TF was turned off 5/13 PM per patient request. During this time chest tube output continued to be milky white but slowed. 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 Per patient, abdomen feels "terrible" with a lot of bloating and pressure. Patient having a lot of flatus and loose stools with a light tan color close to the color of his tube feeds. Pharmacy consulted for TPN to supplement TF.  Per IV team, ok to use port for TPN.  Glucose/Insulin : A1c 7.1% - CBGs <180. Used 18 units SSI yesterday. Lantus  25 units daily and 30 units insulin  in TPN.  Electrolytes: Na 136. Lytes wnl Renal: SCr 1.34 (BL 1.3), BUN 58 Hepatic: ALP 170, AST 20, ALT 7; tbili WNL, albumin  1.9, TG 123 Intake / Output; MIVF: UOP 900 ml, JP drain 15 mL GI Imaging: 5/9 KUB: nonobstructive bowel gas pattern 5/21 CT chest/abd: Small amount of debris and fluid is seen surrounding the esophagus  at the level of the anastomotic leak, however, this appears improved since prior examination. No evidence of intestinal obstruction. GI Surgeries / Procedures:  5/5 EGD stent removal, inspect lumen, new stent placed   Central access: port and double lumen PICC placed 10/03/23 TPN start date: 10/03/23  Nutritional Goals: Goal TPN rate is 90 mL/hr (provides 130g AA and 2290 kCal per day)  RD Estimated Needs Total Energy Estimated Needs: 2200-2400 kcal Total Protein Estimated Needs: 115-130 gm Total Fluid Estimated Needs: >2L/day  Current Nutrition:  TPN at 50 ml/hr provides 1150 kcal and 66 g protein Vivonex RTF at 45 ml/hr provides 1080 kcal and 54 gm protein  Plan:  Continue Cycled TPN over 18 hours starting tonight -taper up 1hr; taper down 1hr  Continue TPN and Vivonex at 45 ml/hr per surgery to meet 100% of needs Electrolytes in TPN: Na 25 mEq/L, K 45 mEq/L, Ca 5 mEq/L, Mg 5 mEq/L, Phos 20 mmol/L, max acetate  No standard MVI and trace elements in TPN (PO multivitamin daily) Continue resistant SSI Q4H  Continue Semglee  at 25 units daily (to account for TF) Continue 30 units insulin  in TPN Monitor TPN labs on Mon/Thurs   Lian Tanori BS, PharmD, BCPS Clinical Pharmacist 10/14/2023 10:31 AM  Contact: [phone] after 3 PM  "Be curious, not judgmental..." -Rumalda Counter

## 2023-10-15 ENCOUNTER — Other Ambulatory Visit (HOSPITAL_COMMUNITY): Payer: Self-pay

## 2023-10-15 ENCOUNTER — Other Ambulatory Visit: Payer: Self-pay

## 2023-10-16 ENCOUNTER — Other Ambulatory Visit (HOSPITAL_COMMUNITY): Payer: Self-pay

## 2023-10-16 ENCOUNTER — Other Ambulatory Visit: Payer: Self-pay

## 2023-10-17 ENCOUNTER — Ambulatory Visit: Admitting: Thoracic Surgery (Cardiothoracic Vascular Surgery)

## 2023-10-17 ENCOUNTER — Ambulatory Visit
Payer: Self-pay | Attending: Thoracic Surgery (Cardiothoracic Vascular Surgery) | Admitting: Thoracic Surgery (Cardiothoracic Vascular Surgery)

## 2023-10-17 DIAGNOSIS — Z9889 Other specified postprocedural states: Secondary | ICD-10-CM

## 2023-10-17 DIAGNOSIS — Z9049 Acquired absence of other specified parts of digestive tract: Secondary | ICD-10-CM

## 2023-10-17 NOTE — Progress Notes (Signed)
     [address]       [name] [address]             [phone]       Patient: Home Provider: Office Consent for Telemedicine visit obtained.  Today's visit was completed via a real-time telehealth (see specific modality noted below). The patient/authorized person provided oral consent at the time of the visit to engage in a telemedicine encounter with the present provider at [facility]. The patient/authorized person was informed of the potential benefits, limitations, and risks of telemedicine. The patient/authorized person expressed understanding that the laws that protect confidentiality also apply to telemedicine. The patient/authorized person acknowledged understanding that telemedicine does not provide emergency services and that he or she would need to call 911 or proceed to the nearest hospital for help if such a need arose.   Total time spent in the clinical discussion 10 minutes.  Telehealth Modality: Phone visit (audio only)  I had a telephone visit with Seth Burch Overall doing well.  He has not heard from [facility] yet.  We will touch base in 2 weeks for stent exchange if he has not be treated.  Devlynn Knoff Ala [place]

## 2023-10-18 ENCOUNTER — Other Ambulatory Visit: Payer: Self-pay

## 2023-10-20 ENCOUNTER — Encounter (HOSPITAL_COMMUNITY): Payer: Self-pay

## 2023-10-20 ENCOUNTER — Other Ambulatory Visit: Payer: Self-pay

## 2023-10-20 ENCOUNTER — Emergency Department (HOSPITAL_COMMUNITY)
Admission: EM | Admit: 2023-10-20 | Discharge: 2023-10-20 | Discharge status: Against Medical Advice | Attending: Emergency Medicine | Admitting: Emergency Medicine

## 2023-10-20 ENCOUNTER — Telehealth: Payer: Self-pay | Admitting: *Deleted

## 2023-10-20 DIAGNOSIS — R531 Weakness: Secondary | ICD-10-CM | POA: Diagnosis not present

## 2023-10-20 DIAGNOSIS — Z5321 Procedure and treatment not carried out due to patient leaving prior to being seen by health care provider: Secondary | ICD-10-CM | POA: Insufficient documentation

## 2023-10-20 DIAGNOSIS — Z8501 Personal history of malignant neoplasm of esophagus: Secondary | ICD-10-CM | POA: Diagnosis not present

## 2023-10-20 DIAGNOSIS — R799 Abnormal finding of blood chemistry, unspecified: Secondary | ICD-10-CM | POA: Diagnosis present

## 2023-10-20 LAB — CBG MONITORING, ED: Glucose-Capillary: 138 mg/dL — ABNORMAL HIGH (ref 70–99)

## 2023-10-20 LAB — COMPREHENSIVE METABOLIC PANEL WITH GFR
ALT: 11 U/L (ref 0–44)
AST: 23 U/L (ref 15–41)
Albumin: 2.3 g/dL — ABNORMAL LOW (ref 3.5–5.0)
Alkaline Phosphatase: 220 U/L — ABNORMAL HIGH (ref 38–126)
Anion gap: 9 (ref 5–15)
BUN: 57 mg/dL — ABNORMAL HIGH (ref 8–23)
CO2: 20 mmol/L — ABNORMAL LOW (ref 22–32)
Calcium: 8.6 mg/dL — ABNORMAL LOW (ref 8.9–10.3)
Chloride: 103 mmol/L (ref 98–111)
Creatinine, Ser: 1.32 mg/dL — ABNORMAL HIGH (ref 0.61–1.24)
GFR, Estimated: 59 mL/min — ABNORMAL LOW (ref >60)
Glucose, Bld: 139 mg/dL — ABNORMAL HIGH (ref 70–99)
Potassium: 4.5 mmol/L (ref 3.5–5.1)
Sodium: 132 mmol/L — ABNORMAL LOW (ref 135–145)
Total Bilirubin: 0.6 mg/dL (ref 0.0–1.2)
Total Protein: 7.5 g/dL (ref 6.5–8.1)

## 2023-10-20 LAB — CBC
HCT: 30.7 % — ABNORMAL LOW (ref 39.0–52.0)
Hemoglobin: 9.7 g/dL — ABNORMAL LOW (ref 13.0–17.0)
MCH: 29.8 pg (ref 26.0–34.0)
MCHC: 31.6 g/dL (ref 30.0–36.0)
MCV: 94.5 fL (ref 80.0–100.0)
Platelets: 316 10*3/uL (ref 150–400)
RBC: 3.25 MIL/uL — ABNORMAL LOW (ref 4.22–5.81)
RDW: 19.8 % — ABNORMAL HIGH (ref 11.5–15.5)
WBC: 10.1 10*3/uL (ref 4.0–10.5)
nRBC: 0 % (ref 0.0–0.2)

## 2023-10-20 NOTE — Telephone Encounter (Signed)
 Received phone call from [facility] [place] Cierra. Per RN, patient was seen this morning for lab draw. States while there, patient's JP drain is putting out milky, white drainage. RN reports this is changed since she saw him last week. States patient is complaining of increased cough with SOB. Denies fevers. States feeding tube is leaking bile and causing skin breakdown around tube. Message sent to Dr. Deloise Ferries to make aware. In interim of sending message. Critical lab values received on patient. Per lab report, glucose is 1047 and potassium is 8.1. Discussed labs with on call PA. Advised patient seek care via ED. Called patient and spoke with spouse. Advised patient and wife of PA recommendations. Wife states they will go to the ED. MD notified.

## 2023-10-20 NOTE — ED Provider Triage Note (Signed)
 Emergency Medicine Provider Triage Evaluation Note  Seth Burch , a 68 y.o. male  was evaluated in triage.  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 Reports feeling somewhat weak but had not felt abnormal initially.  Review of Systems  Positive: Abnormal labs, weakness Negative:   Physical Exam  BP 95/79   Pulse 99   Temp 98.7 F (37.1 C)   Resp 14   Ht 5\' 10"  (1.778 m)   SpO2 95%   BMI 22.59 kg/m  Gen:   Awake, no distress   Resp:  Normal effort  MSK:   Moves extremities without difficulty  Other:    Medical Decision Making  Medically screening exam initiated at 2:02 PM.  Appropriate orders placed.  OLUWASEUN CREMER was informed that the remainder of the evaluation will be completed by another provider, this initial triage assessment does not replace that evaluation, and the importance of remaining in the ED until their evaluation is complete.  Workup initiated in triage    Nelly Banco, [place] 10/20/23 1405

## 2023-10-20 NOTE — ED Triage Notes (Signed)
 Pt came in via POV d/t his home health RN reporting to him that his glucose & Potassium levels were abnormal. A/Ox4.

## 2023-10-23 ENCOUNTER — Encounter: Payer: Self-pay | Admitting: Thoracic Surgery (Cardiothoracic Vascular Surgery)

## 2023-10-24 ENCOUNTER — Telehealth: Payer: Self-pay | Admitting: *Deleted

## 2023-10-24 LAB — LAB REPORT - SCANNED
EGFR (Non-African Amer.): 61
EGFR (Non-African Amer.): 74

## 2023-10-24 NOTE — Telephone Encounter (Signed)
 Received critical labs from [facility]. Na 122, K 8.4, glucose 1260. Per Dr. Deloise Ferries, [facility] Deborha Falls, notified to stick patient in non PICC line arm. RN verbalized understanding and will repeat labs today.

## 2023-10-30 ENCOUNTER — Ambulatory Visit
Attending: Thoracic Surgery (Cardiothoracic Vascular Surgery) | Admitting: Thoracic Surgery (Cardiothoracic Vascular Surgery)

## 2023-10-30 ENCOUNTER — Other Ambulatory Visit (HOSPITAL_COMMUNITY): Payer: Self-pay

## 2023-10-30 DIAGNOSIS — Z9889 Other specified postprocedural states: Secondary | ICD-10-CM

## 2023-10-30 DIAGNOSIS — Z9049 Acquired absence of other specified parts of digestive tract: Secondary | ICD-10-CM

## 2023-10-30 NOTE — Progress Notes (Signed)
     [address]       [name] [address]             [phone]       Patient: Home Provider: Office Consent for Telemedicine visit obtained.  Today's visit was completed via a real-time telehealth (see specific modality noted below). The patient/authorized person provided oral consent at the time of the visit to engage in a telemedicine encounter with the present provider at [facility]. The patient/authorized person was informed of the potential benefits, limitations, and risks of telemedicine. The patient/authorized person expressed understanding that the laws that protect confidentiality also apply to telemedicine. The patient/authorized person acknowledged understanding that telemedicine does not provide emergency services and that he or she would need to call 911 or proceed to the nearest hospital for help if such a need arose.   Total time spent in the clinical discussion 10 minutes.  Telehealth Modality: Phone visit (audio only)  I had a telephone visit with Seth Burch Overall he is doing well.  He has met with [facility], and has his endoscopy scheduled for July 10th.  He has gained 2 lbs and is feeling much better.  I will leave the stent in place for now, and follow-up with him after his endoscopy.  [name]

## 2023-10-31 ENCOUNTER — Other Ambulatory Visit: Payer: Self-pay

## 2023-10-31 LAB — LAB REPORT - SCANNED: EGFR: 83

## 2023-11-03 ENCOUNTER — Encounter: Payer: Self-pay | Admitting: Thoracic Surgery (Cardiothoracic Vascular Surgery)

## 2023-11-06 ENCOUNTER — Other Ambulatory Visit (HOSPITAL_COMMUNITY): Payer: Self-pay

## 2023-11-07 ENCOUNTER — Telehealth: Payer: Self-pay

## 2023-11-07 NOTE — Telephone Encounter (Signed)
 Deborha Falls, [facility] with [facility] contacted the office with concerns about patient having a coughing spell which caused him to cough up a gray solid what they called stitch. He states that he had been feeling something in my throat and after this was out the feeling has since subsided. Patient is feeling well. Patient requested to have a chest xray done through home health as they will come to the home. Order sent to [facility] for chest xray. Cierra with [facility] aware.   She also states that patient has had 10 ml's out of his chest tube drain since Sunday (x6 days). Family and patient were inquiring if chest tube should still remain in place. Advcised that Dr. Deloise Ferries would be notified of the request but patient should continue care as usual until further instructions.

## 2023-11-13 ENCOUNTER — Encounter: Payer: Self-pay | Admitting: Thoracic Surgery (Cardiothoracic Vascular Surgery)

## 2023-11-17 ENCOUNTER — Encounter: Payer: Self-pay | Admitting: Thoracic Surgery (Cardiothoracic Vascular Surgery)

## 2023-11-25 ENCOUNTER — Encounter: Payer: Self-pay | Admitting: Thoracic Surgery (Cardiothoracic Vascular Surgery)

## 2023-11-27 NOTE — Progress Notes (Signed)
Pt arrived to recovery via stretcher from procedure. Report received from RN & CRNA. Airway patent, abdomen soft, non-tender, IV patent, denied pain. Pt connected to monitor; VS monitored per protocol. Family brought back to recovery; d/c instructions reviewed.    MD in to assess patient and speak with patient and family regarding procedure and outcomes. IV removed prior to release. Transport ordered. Pt d/c to home, with family member, in stable condition.

## 2023-11-27 NOTE — Procedures (Signed)
 Patient Name: Seth Burch Attending MD: Abraham Buerger , MD,  Date of Birth: February 27, 1956 Procedure Date: 11/27/2023 11:06 AM Age: 68 Instrument Name: EG - [id number] MRN: [id number] Procedure:             Upper GI endoscopy Indications:           Management of operative complication: Stenting of                         anastomotic leak Providers:             Darin Dufault, MD Referring MD:          Linnie FLETCHER Lightfoot Complications:         No immediate complications. Procedure:             After obtaining informed consent, the endoscope was                         passed under direct vision. Throughout the procedure,                         the patient's blood pressure, pulse, and oxygen                         saturations were monitored continuously. The Flexible                         Endoscope was introduced through the mouth, and                         advanced to the second part of duodenum. The upper GI                         endoscopy was accomplished without difficulty. The                         patient tolerated the procedure well. Findings:              A complete esophagectomy anastomosis was found in the                         proximal esophagus.                        An esophageal stent was found in the proximal                         esophagus. Stent removal was accomplished with a                         Raptor grasping device.                        A suture was found at the esophageal anastomosis.                         Removal was accomplished with a scissors.                        Using a below injecting 20mm sweeping balloon, the  gastric lumen distal to the site of the anastomosis                         was occluded and contrast injected, filling the lumen                         at the site of the anastomosis. There was no leak of                         contrast identified in multiple projections. Estimated Blood Loss:       Estimated blood loss: none. Impression:            - A complete esophagectomy anastomosis was found.                        - Pre-existing esophageal stent, removed.                        - A suture was found in the esophagus. Removal was                         successful. Recommendation:        - Clear liquid diet for 2 days, then advance as                         tolerated to mechanical soft diet.                        - Repeat upper endoscopy in 6 weeks for surveillance. Attending Participation:      I personally performed the entire procedure. Darin Dufault, MD 11/27/2023 12:24:37 PM This report has been signed electronically. Number of Addenda: 0 Note Initiated On: 11/27/2023 11:06 AM

## 2023-11-27 NOTE — Care Plan (Signed)
  Problem: Hemodynamic Status Goal: Hemodynamic status to baseline/discharge criteria met 11/27/2023 1301 by Kristian Buddle, RN Outcome: Met/ Completed 11/27/2023 1004 by Kristian Buddle, RN Outcome: Progressing   Problem: Safety & Psycho Social Goal: Patient returns to baseline activity/meets discharge criteria 11/27/2023 1301 by Kristian Buddle, RN Outcome: Met/ Completed 11/27/2023 1004 by Kristian Buddle, RN Outcome: Progressing   Problem: Nutrition Goal: Patient to baseline / improved nutrition 11/27/2023 1301 by Kristian Buddle, RN Outcome: Met/ Completed 11/27/2023 1004 by Kristian Buddle, RN Outcome: Progressing   Problem: Lab & Diagnostics Goal: Additional tests per physician orders 11/27/2023 1301 by Kristian Buddle, RN Outcome: Met/ Completed 11/27/2023 1004 by Kristian Buddle, RN Outcome: Progressing   Problem: Respiratory Goal: SaO2 sat, airway patency, cough mechanism maintained 11/27/2023 1301 by Kristian Buddle, RN Outcome: Met/ Completed 11/27/2023 1004 by Kristian Buddle, RN Outcome: Progressing   Problem: Knowledge Deficit, Education, Discharge Plan Goal: Patient/S.O.verbalize understanding of procedure/DC instruct 11/27/2023 1301 by Kristian Buddle, RN Outcome: Met/ Completed 11/27/2023 1004 by Kristian Buddle, RN Outcome: Progressing   Problem: Pain Management Goal: Patient return to pre procedure comfort 11/27/2023 1301 by Kristian Buddle, RN Outcome: Met/ Completed 11/27/2023 1004 by Kristian Buddle, RN Outcome: Progressing   Problem: Risk for aspiration r/t sedation/anesthesia or medication administration Goal: Patient verbalizes understanding of risk of aspiration 11/27/2023 1301 by Kristian Buddle, RN Outcome: Met/ Completed 11/27/2023 1004 by Kristian Buddle, RN Outcome: Progressing Goal: Patient will remain free of signs of aspiration and the risk of aspiration is decreased 11/27/2023 1301 by Kristian Buddle, RN Outcome: Met/  Completed 11/27/2023 1004 by Kristian Buddle, RN Outcome: Progressing   Problem: Risk for Injury Goal: The patient will function at optimal level and be free of injury 11/27/2023 1301 by Kristian Buddle, RN Outcome: Met/ Completed 11/27/2023 1004 by Kristian Buddle, RN Outcome: Progressing Goal: Pt is free from signs/symptoms of injury r/t positioning 11/27/2023 1301 by Kristian Buddle, RN Outcome: Met/ Completed 11/27/2023 1004 by Kristian Buddle, RN Outcome: Progressing Goal: Patient will tolerate positioning interventions 11/27/2023 1301 by Kristian Buddle, RN Outcome: Met/ Completed 11/27/2023 1004 by Kristian Buddle, RN Outcome: Progressing   Problem: Risk for Falls Goal: Pt will remain free of falls 11/27/2023 1301 by Kristian Buddle, RN Outcome: Met/ Completed 11/27/2023 1004 by Kristian Buddle, RN Outcome: Progressing Goal: Patient remains free of injury and or falls 11/27/2023 1301 by Kristian Buddle, RN Outcome: Met/ Completed 11/27/2023 1004 by Kristian Buddle, RN Outcome: Progressing Goal: Patient will mobilize safely without falling 11/27/2023 1301 by Kristian Buddle, RN Outcome: Met/ Completed 11/27/2023 1004 by Kristian Buddle, RN Outcome: Progressing

## 2023-11-28 NOTE — Progress Notes (Signed)
   11/28/23 1559  Post Procedure Note  Patient Admission Status Outpatient - This procedure was performed during an outpatient admission  Did you receive procedure discharge instructions? Yes  Do you have contact information for [facility] Yes  Since your procedure, have you received care at an Emergency Department, Urgent Care or with your healthcare provider? No - Comment  Are you experiencing any new pain since your procedure?  No  Pain Score %% Zero  Do you have any new GI symptoms or concerns after your procedure? No  Do you have any new Respiratory symptoms or concerns after your procedure? No  Do you have any concerns about your IV site? No - IV Site appears normal (denies bruising, pain, or swelling)  Do you have any suggestions to improve your procedure visit at [facility]? No

## 2023-11-28 NOTE — Progress Notes (Signed)
   11/28/23 1558  OTHER  Post-op Call Complete Y

## 2023-12-01 ENCOUNTER — Other Ambulatory Visit: Payer: Self-pay

## 2023-12-05 ENCOUNTER — Ambulatory Visit
Attending: Thoracic Surgery (Cardiothoracic Vascular Surgery) | Admitting: Thoracic Surgery (Cardiothoracic Vascular Surgery)

## 2023-12-05 DIAGNOSIS — Z9889 Other specified postprocedural states: Secondary | ICD-10-CM

## 2023-12-05 DIAGNOSIS — Z9049 Acquired absence of other specified parts of digestive tract: Secondary | ICD-10-CM | POA: Diagnosis not present

## 2023-12-05 NOTE — Progress Notes (Signed)
     [address]       [name] [address]             [phone]       Patient: Home Provider: Office Consent for Telemedicine visit obtained.  Today's visit was completed via a real-time telehealth (see specific modality noted below). The patient/authorized person provided oral consent at the time of the visit to engage in a telemedicine encounter with the present provider at [facility]. The patient/authorized person was informed of the potential benefits, limitations, and risks of telemedicine. The patient/authorized person expressed understanding that the laws that protect confidentiality also apply to telemedicine. The patient/authorized person acknowledged understanding that telemedicine does not provide emergency services and that he or she would need to call 911 or proceed to the nearest hospital for help if such a need arose.   Total time spent in the clinical discussion 10 minutes.  Telehealth Modality: Phone visit (audio only)  I had a telephone visit with Seth Burch [place] Overall he is doing well.  He was seen by [facility] gastroenterology and the anastomotic leak it completely healed.  He is currently tolerating a diet.  He is still on nocturnal feeds.  At this point I think that it is safe to get his feeding tube out.  His chest tube will also need to be removed.  I will schedule him to come to clinic for with PA visit for removal of both of those.

## 2023-12-06 ENCOUNTER — Other Ambulatory Visit: Payer: Self-pay

## 2023-12-08 NOTE — Progress Notes (Signed)
 [address] 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 He presents today to the clinic for removal of chest tube and feeding tube.The patient's post operative course was long and complicated.  He developed delayed anastomotic leak and sepsis with empyema.  He required esophageal stent placement which was attempted to be removed in May, however EGD revealed continued leak requiring replacement of stent.  He was transferred to [facility] for GI evaluation.  He was able to have his stent removed on 11/27/23.  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  He ultimately required TPN and Tube feedings to maintain nutritional status.  Today he presents for chest tube removal and removal of his Jejunostomy tube.  Overall the patient is doing pretty well.  He has been able to gain some weight of about 2-3 lbs.  He had stopped tube feedings over the past several days to adjust to not having these.  His chest tube also fell out on July 4.  The patient continues to have a constant issue with coughing.  This is not new, has been present since the hospital.  He continues to use Tussinex prn.  Patient also asked when he can start Chemo.  Current Outpatient Medications  Medication Sig Dispense Refill   acetaminophen  (TYLENOL ) 325 MG tablet Place 2 tablets (650 mg total) into feeding tube every 6 (six) hours as needed for mild pain (pain score 1-3) (or Fever >/= 101).     amLODipine  (NORVASC ) 10 MG tablet Crush and place 1 tablet (10 mg total) into feeding tube daily. 30 tablet 1   atorvastatin  (LIPITOR) 20 MG tablet Crush and place 1 tablet (20 mg total) into feeding tube daily. 30 tablet 1   chlorpheniramine-HYDROcodone (TUSSIONEX) 10-8 MG/5ML Place 5 mLs into feeding tube every 12  (twelve) hours as needed for cough. 200 mL 0   doxazosin  (CARDURA ) 2 MG tablet Crush and place 1 tablet (2 mg total) into feeding tube daily. 30 tablet 1   escitalopram  (LEXAPRO ) 5 MG tablet Crush and place 1 tablet (5 mg total) into feeding tube daily. 30 tablet 1   famotidine  (PEPCID ) 20 MG tablet Crush and place 1 tablet (20 mg total) into feeding tube daily. 30 tablet 1   fiber (NUTRISOURCE FIBER) PACK packet Place 1 packet into feeding tube 2 (two) times daily. (Patient not taking: Reported on 09/21/2023) 75 each 1   folic acid  (FOLVITE ) 1 MG tablet Crush and place 1 tablet (1 mg total) into feeding tube daily. 30 tablet 1   insulin  glargine (LANTUS ) 100 UNIT/ML Solostar Pen Inject 25 Units into the skin daily. 9 mL 2   insulin  lispro (HUMALOG ) 100 UNIT/ML KwikPen Inject 0-20 Units into the skin every 4 (four) hours. CBG 70 - 120: 0 units  CBG 121 - 150:  3 units  CBG 151 - 200: 4 units  CBG 201 - 250: 7 units CBG 251 - 300:  11 units   CBG 301 - 350: 15 units CBG 351 - 400: 20 units 15 mL 11   Insulin  Pen Needle 32G X 4 MM MISC Use with insulin  pens 200 each 0   liver oil-zinc  oxide (DESITIN) 40 % ointment Apply topically 3 (  three) times daily as needed for irritation. 56.7 g 0   metoprolol  tartrate (LOPRESSOR ) 25 MG tablet Crush and place 1 tablet (25 mg total) into feeding tube 2 (two) times daily. 60 tablet 1   Multiple Vitamin (MULTIVITAMIN WITH MINERALS) TABS tablet Crush and place 1 tablet into feeding tube daily. 30 tablet 1   Nutritional Supplements (VIVONEX RTF) LIQD Take 45 mLs by mouth every hour.     Nystatin (GERHARDT'S BUTT CREAM) CREA Apply 1 Application topically 3 (three) times daily. (Patient not taking: Reported on 09/21/2023) 1 each 1   ondansetron  (ZOFRAN ) 4 MG tablet Crush and place 1 tablet (4 mg total) into feeding tube every 6 (six) hours as needed for nausea. 20 tablet 0   oxyCODONE  (OXY IR/ROXICODONE ) 5 MG immediate release tablet Crush and place 1 tablet (5 mg total)  into feeding tube every 6 (six) hours as needed for moderate pain (pain score 4-6). Do not take if the Tussionex (medication for cough) has been taken within the previous 6 hours. 28 tablet 0   Protein (FEEDING SUPPLEMENT, PROSOURCE TF20,) liquid Place 60 mLs into feeding tube 2 (two) times daily. (Patient not taking: Reported on 09/21/2023)     saccharomyces boulardii (FLORASTOR) 250 MG capsule Open 1 capsule (250 mg total) and put into feeding tube 2 (two) times daily. 60 capsule 1   simethicone  (MYLICON) 40 MG/0.6ML drops Place 1.2 mLs (80 mg total) into feeding tube 4 (four) times daily as needed (for bloating, or excessive gas.). 30 mL 0   thiamine  (VITAMIN B1) 100 MG tablet Crush and place 1 tablet (100 mg total) into feeding tube daily. 30 tablet 1   Water  For Irrigation, Sterile (FREE WATER ) SOLN Place 250 mLs into feeding tube in the morning, at noon, in the evening, and at bedtime.     No current facility-administered medications for this visit.    Physical Exam:  Gen: NAD Heart: RRR Lungs: CTA bilaterally Incisions: well healed, J Tube site remains erythematous with mild drainage  Diagnostic Tests:  CXR: looks great, no pleural effusions, port visible, chylothorax coils stable  A/P:  Overall making progress.  He has been able to gain some weight.. In regards to diet, I advised patient to start making a diary of his oral intake to see what his nutritional intake is.  He was also instructed to transition TPN to nightly only to see if this can help with the persistent coughing and being to full.  He was instructed to contact Oncologist and set up appointment and from our standpoint he can likely start chemotherapy, however will defer to Oncology.  I do recommend evaluation by SLP to assess swallowing function and see where we stand in comparison to his last visit.  He remains debilitated and is not yet ready to RTW.  I would recommended at least 4 more weeks off at this time.  RTC as  scheduled with Dr. Shyrl Rocky Shad, PA-C [facility] [phone]

## 2023-12-09 ENCOUNTER — Other Ambulatory Visit

## 2023-12-09 ENCOUNTER — Ambulatory Visit: Admitting: Physician Assistant

## 2023-12-09 ENCOUNTER — Telehealth: Payer: Self-pay

## 2023-12-09 ENCOUNTER — Ambulatory Visit (HOSPITAL_COMMUNITY)
Admission: RE | Admit: 2023-12-09 | Discharge: 2023-12-09 | Disposition: A | Discharge status: Home / Self Care | Source: Ambulatory Visit | Attending: Physician Assistant | Admitting: Physician Assistant

## 2023-12-09 VITALS — BP 104/66 | HR 98 | Resp 20 | Ht 70.0 in | Wt 171.5 lb

## 2023-12-09 DIAGNOSIS — Z9049 Acquired absence of other specified parts of digestive tract: Secondary | ICD-10-CM | POA: Diagnosis present

## 2023-12-09 DIAGNOSIS — Z9889 Other specified postprocedural states: Secondary | ICD-10-CM | POA: Insufficient documentation

## 2023-12-09 DIAGNOSIS — J90 Pleural effusion, not elsewhere classified: Secondary | ICD-10-CM | POA: Diagnosis present

## 2023-12-09 DIAGNOSIS — Z438 Encounter for attention to other artificial openings: Secondary | ICD-10-CM | POA: Diagnosis not present

## 2023-12-09 MED ORDER — HYDROCOD POLI-CHLORPHE POLI ER 10-8 MG/5ML PO SUER: 5.0000 mL | Freq: Two times a day (BID) | ORAL | 0 refills | Status: DC | PRN

## 2023-12-09 NOTE — Telephone Encounter (Signed)
 [facility] contacted the office stating they could only fill 7 day supply of Tussinex for cough. States that he would need a new prescription if he ran out. Prescription changed to 7-day supply per phamracy. Manuelita Rough, PA aware and acknowledged receipt.

## 2023-12-10 ENCOUNTER — Other Ambulatory Visit: Payer: Self-pay | Admitting: Physician Assistant

## 2023-12-11 ENCOUNTER — Other Ambulatory Visit: Payer: Self-pay

## 2023-12-13 ENCOUNTER — Other Ambulatory Visit: Payer: Self-pay | Admitting: Physician Assistant

## 2023-12-13 ENCOUNTER — Other Ambulatory Visit (HOSPITAL_COMMUNITY): Payer: Self-pay

## 2023-12-19 ENCOUNTER — Ambulatory Visit
Attending: Thoracic Surgery (Cardiothoracic Vascular Surgery) | Admitting: Thoracic Surgery (Cardiothoracic Vascular Surgery)

## 2023-12-19 ENCOUNTER — Encounter: Payer: Self-pay | Admitting: Thoracic Surgery (Cardiothoracic Vascular Surgery)

## 2023-12-19 VITALS — BP 103/64 | HR 85 | Resp 20 | Ht 70.0 in | Wt 166.1 lb

## 2023-12-19 DIAGNOSIS — Z9049 Acquired absence of other specified parts of digestive tract: Secondary | ICD-10-CM

## 2023-12-19 DIAGNOSIS — Z9889 Other specified postprocedural states: Secondary | ICD-10-CM

## 2023-12-20 ENCOUNTER — Other Ambulatory Visit: Payer: Self-pay

## 2023-12-22 NOTE — Progress Notes (Signed)
     [address]       [place] [address]             [phone]       Overall doing well.  He admits to having significant reflux and coughing.  He did admit to eating in a recliner, and not using the swallowing techniques from before.  We discussed his new anatomy, and I expressed the importance of heating upright, and remaining so for several hours after eating.  Our goal is to get him off of the TPN at this point.  I will call him back in a few weeks to see how his is doing with his PO intake.  Embry Huss MALVA Rayas

## 2023-12-29 ENCOUNTER — Telehealth: Payer: Self-pay

## 2023-12-29 NOTE — Telephone Encounter (Signed)
 Left message for the pt to call our office to schedule IN OFFICE NEW PT Preop appt.

## 2023-12-29 NOTE — Telephone Encounter (Signed)
   Pre-operative Risk Assessment    Patient Name: Seth Burch  DOB: [birth year] MRN: [id number]   Date of last office visit: NONE Date of next office visit: NONE   Request for Surgical Clearance    Procedure:  EGD -UPPER ENDOSCOPY  Date of Surgery:  Clearance 01/15/24                                Surgeon:  ABRAHAM BUERGER, MD Surgeon's Group or Practice Name:  [facility]- GASTRO[facility] Phone number:  [phone] Fax number:  [phone]   Type of Clearance Requested:   - Medical    Type of Anesthesia:  Not Indicated   Additional requests/questions:    Signed, Lucie DELENA Ku   12/29/2023, 10:16 AM

## 2023-12-29 NOTE — Telephone Encounter (Signed)
   Name: Seth Burch  DOB: 06/22/1955  MRN: [id number]  Primary Cardiologist: None  Chart reviewed as part of pre-operative protocol coverage. Because of Mithran Strike Yehle's past medical history and time since last visit, he will require a follow-up in-office visit in order to better assess preoperative cardiovascular risk.  On chart review it does not appear that patient has been followed by cardiology, he will need a new patient appointment.  Pre-op covering staff: - Please schedule appointment and call patient to inform them. If patient already had an upcoming appointment within acceptable timeframe, please add pre-op clearance to the appointment notes so provider is aware. - Please contact requesting surgeon's office via preferred method (i.e, phone, fax) to inform them of need for appointment prior to surgery.  Amaiya Scruton D Torez Beauregard, NP  12/29/2023, 10:36 AM

## 2023-12-30 NOTE — Telephone Encounter (Signed)
 LVM asking pt to call our office to schedule OV for preop clearance. Please reach out to preop team if pt returns call. Thank you

## 2023-12-31 ENCOUNTER — Inpatient Hospital Stay: Attending: Oncology

## 2023-12-31 ENCOUNTER — Inpatient Hospital Stay

## 2023-12-31 ENCOUNTER — Inpatient Hospital Stay (HOSPITAL_BASED_OUTPATIENT_CLINIC_OR_DEPARTMENT_OTHER): Admitting: Oncology

## 2023-12-31 VITALS — BP 120/78 | HR 109 | Temp 98.6°F | Resp 18 | Ht 70.0 in | Wt 163.7 lb

## 2023-12-31 DIAGNOSIS — Z79899 Other long term (current) drug therapy: Secondary | ICD-10-CM | POA: Insufficient documentation

## 2023-12-31 DIAGNOSIS — E1122 Type 2 diabetes mellitus with diabetic chronic kidney disease: Secondary | ICD-10-CM | POA: Insufficient documentation

## 2023-12-31 DIAGNOSIS — K219 Gastro-esophageal reflux disease without esophagitis: Secondary | ICD-10-CM | POA: Diagnosis not present

## 2023-12-31 DIAGNOSIS — R053 Chronic cough: Secondary | ICD-10-CM | POA: Diagnosis not present

## 2023-12-31 DIAGNOSIS — N189 Chronic kidney disease, unspecified: Secondary | ICD-10-CM | POA: Diagnosis not present

## 2023-12-31 DIAGNOSIS — C155 Malignant neoplasm of lower third of esophagus: Secondary | ICD-10-CM

## 2023-12-31 DIAGNOSIS — E785 Hyperlipidemia, unspecified: Secondary | ICD-10-CM | POA: Insufficient documentation

## 2023-12-31 DIAGNOSIS — D649 Anemia, unspecified: Secondary | ICD-10-CM | POA: Diagnosis not present

## 2023-12-31 DIAGNOSIS — I129 Hypertensive chronic kidney disease with stage 1 through stage 4 chronic kidney disease, or unspecified chronic kidney disease: Secondary | ICD-10-CM | POA: Diagnosis not present

## 2023-12-31 DIAGNOSIS — J90 Pleural effusion, not elsewhere classified: Secondary | ICD-10-CM | POA: Insufficient documentation

## 2023-12-31 DIAGNOSIS — R197 Diarrhea, unspecified: Secondary | ICD-10-CM | POA: Insufficient documentation

## 2023-12-31 DIAGNOSIS — Z923 Personal history of irradiation: Secondary | ICD-10-CM | POA: Diagnosis not present

## 2023-12-31 DIAGNOSIS — Z9221 Personal history of antineoplastic chemotherapy: Secondary | ICD-10-CM | POA: Insufficient documentation

## 2023-12-31 DIAGNOSIS — D72829 Elevated white blood cell count, unspecified: Secondary | ICD-10-CM | POA: Diagnosis not present

## 2023-12-31 LAB — CBC WITH DIFFERENTIAL (CANCER CENTER ONLY)
Abs Immature Granulocytes: 0.05 K/uL (ref 0.00–0.07)
Basophils Absolute: 0.1 K/uL (ref 0.0–0.1)
Basophils Relative: 1 %
Eosinophils Absolute: 0.1 K/uL (ref 0.0–0.5)
Eosinophils Relative: 1 %
HCT: 35.3 % — ABNORMAL LOW (ref 39.0–52.0)
Hemoglobin: 11.4 g/dL — ABNORMAL LOW (ref 13.0–17.0)
Immature Granulocytes: 0 %
Lymphocytes Relative: 18 %
Lymphs Abs: 2.4 K/uL (ref 0.7–4.0)
MCH: 28.9 pg (ref 26.0–34.0)
MCHC: 32.3 g/dL (ref 30.0–36.0)
MCV: 89.6 fL (ref 80.0–100.0)
Monocytes Absolute: 0.8 K/uL (ref 0.1–1.0)
Monocytes Relative: 6 %
Neutro Abs: 9.9 K/uL — ABNORMAL HIGH (ref 1.7–7.7)
Neutrophils Relative %: 74 %
Platelet Count: 225 K/uL (ref 150–400)
RBC: 3.94 MIL/uL — ABNORMAL LOW (ref 4.22–5.81)
RDW: 13.5 % (ref 11.5–15.5)
WBC Count: 13.3 K/uL — ABNORMAL HIGH (ref 4.0–10.5)
nRBC: 0 % (ref 0.0–0.2)

## 2023-12-31 LAB — CMP (CANCER CENTER ONLY)
ALT: 16 U/L (ref 0–44)
AST: 20 U/L (ref 15–41)
Albumin: 3.6 g/dL (ref 3.5–5.0)
Alkaline Phosphatase: 249 U/L — ABNORMAL HIGH (ref 38–126)
Anion gap: 7 (ref 5–15)
BUN: 29 mg/dL — ABNORMAL HIGH (ref 8–23)
CO2: 30 mmol/L (ref 22–32)
Calcium: 9.3 mg/dL (ref 8.9–10.3)
Chloride: 102 mmol/L (ref 98–111)
Creatinine: 1.51 mg/dL — ABNORMAL HIGH (ref 0.61–1.24)
GFR, Estimated: 50 mL/min — ABNORMAL LOW (ref >60)
Glucose, Bld: 175 mg/dL — ABNORMAL HIGH (ref 70–99)
Potassium: 4 mmol/L (ref 3.5–5.1)
Sodium: 139 mmol/L (ref 135–145)
Total Bilirubin: 0.4 mg/dL (ref 0.0–1.2)
Total Protein: 8.2 g/dL — ABNORMAL HIGH (ref 6.5–8.1)

## 2023-12-31 LAB — PHOSPHORUS: Phosphorus: 3.5 mg/dL (ref 2.5–4.6)

## 2023-12-31 LAB — MAGNESIUM: Magnesium: 2 mg/dL (ref 1.7–2.4)

## 2023-12-31 MED ORDER — PROCHLORPERAZINE MALEATE 10 MG PO TABS: 10.0000 mg | ORAL_TABLET | Freq: Four times a day (QID) | ORAL | 1 refills | Status: DC | PRN

## 2023-12-31 MED ORDER — LIDOCAINE-PRILOCAINE 2.5-2.5 % EX CREA: TOPICAL_CREAM | CUTANEOUS | 3 refills | Status: DC

## 2023-12-31 MED ORDER — ONDANSETRON HCL 8 MG PO TABS: 8.0000 mg | ORAL_TABLET | Freq: Three times a day (TID) | ORAL | 1 refills | Status: DC | PRN

## 2023-12-31 NOTE — Telephone Encounter (Signed)
 Unable to contact pt to discuss preop clearance third and final attempt.

## 2023-12-31 NOTE — Progress Notes (Signed)
 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

## 2023-12-31 NOTE — Progress Notes (Signed)
 [facility]  ONCOLOGY CLINIC PROGRESS NOTE   Patient Name:  Seth Burch  MR # [id number]  DOB: 1955-08-31   Patient Care Team: Marelyn Axon, MD as PCP - General (Family Medicine) Misenheimer, Evalene, MD as Consulting Physician (Gastroenterology) Autumn Millman, MD as Consulting Physician (Hematology and Oncology)  Date of visit: 12/31/2023   ASSESSMENT & PLAN:   68 y.o. 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  Primary adenocarcinoma of distal third of esophagus [facility]) Please review oncology history for additional details.  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  Concern for lymph node involvement based on CT scan.   - On 04/04/2023, Dr. Rollin performed upper EUS.  It was at least T3, N 1/2, MX by endosonographic criteria.   -Previously ordered additional testing on biopsy specimen for potential targeted therapy options including MMR testing, PD-L1 testing  -Staging PET scan on 04/07/2023 showed distal esophageal/GE junction adenocarcinoma.  Lower paraesophageal nodal metastasis.  No evidence of distant metastatic disease.  -Referral sent to cardiothoracic surgeon Dr. Shyrl and to Dr. Dewey, radiation oncologist for concurrent chemoradiation planning in the neoadjuvant setting.  -He started radiation treatments under the direction of Dr. Dewey from 04/21/2023.  He started chemotherapy with carboplatin  and paclitaxel  from 04/25/2023.  Completed last dose of chemotherapy on 05/30/2023.  -Restaging PET scan from 06/30/2023 showed excellent response with substantial interval improvement in hypermetabolic activity within the distal esophagus, indicating a positive response to therapy.  FDG activity improved from 26 to 5.7, suggesting  decreased tumor activity. No residual or progressive nodal disease or signs of disease elsewhere.   On 07/28/2023, Dr. Shyrl performed Ivor Lewis esophagectomy, pyloromyotomy, laparoscopic jejunostomy placement.  Final pathology showed very focal minimal residual adenocarcinoma predominantly in the background of scar, near complete response, score 1 of 3.margins were negative.  14 lymph nodes were removed and they were negative for malignancy. ypT1b,pN0.   The patient's post operative course was long and complicated.  He developed delayed anastomotic leak and sepsis with empyema.  He required esophageal stent placement which was attempted to be removed in May 2025, however EGD revealed continued leak requiring replacement of stent.  He was transferred to [facility] for GI evaluation.  He was able to have his stent removed on 11/27/23.  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  He ultimately required TPN and Tube feedings to maintain nutritional status.  He eventually had a jejunostomy tube removed on 12/09/2023.  Reviewed relevant so far with the patient and his wife in detail.  Plan for restaging CT chest abdomen and pelvis.  Plan for adjuvant treatments with nivolumab every 2 weeks for 8 cycles followed by every 4 weeks.   -He was advised to continue omeprazole daily.  Briefly discussed side effect profile with nivolumab treatments including immune mediated side effects.  Patient would prefer to wait at least 4 more weeks before starting treatments, as he will be going out of town on a beach trip.  Will arrange appointments accordingly.  Assessment & Plan  Chronic cough with postprandial expectoration Chronic cough with postprandial expectoration, associated with clear, foamy, or light yellow thick sputum. Coughing fits last up to 45 minutes. No fever and clear lung sounds suggest  irritation rather  than infection. - Recommend liquid Mucinex  for mucus clearance. - Recommend Mucinex  DM for cough suppression.  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  Central venous catheter (PICC line) management with cap malfunction PICC line cap malfunction with one cap stuck and unable to be removed. Home health services have been unable to resolve the issue. - Utilize available devices to remove the stuck cap on the PICC line.  Mild leukocytosis Mild leukocytosis with a white blood cell count of 13,000, slightly elevated from the normal range of 11,000, persistent since May.  Anemia, improving Anemia is improving with hemoglobin levels rising to 11.4, approaching the normal range of 13. Previously, hemoglobin levels were as low as 6.8.  Chronic kidney disease, stage unspecified Chronic kidney disease with creatinine levels at 1.51.    I reviewed lab results and outside records for this visit and discussed relevant results with the patient. Diagnosis, plan of care and treatment options were also discussed in detail with the patient. Opportunity provided to ask questions and answers provided to his apparent satisfaction. Provided instructions to call our clinic with any problems, questions or concerns prior to return visit. I recommended to continue follow-up with PCP and sub-specialists. He verbalized understanding and agreed with the plan.   NCCN guidelines have been consulted in the planning of this patient's care.  I provided 40 minutes of face-to-face time during this encounter and > 50% was spent counseling as documented under my assessment and plan.    Coreen Shippee, MD   [facility] - A DEPT OF [facility] [address] [place] [address] Dept: [phone] Dept Fax: [phone]    CHIEF COMPLAINT/ REASON FOR VISIT:    Adenocarcinoma of lower third of esophagus/GE junction, clinical stage III  Current Treatment: Neoadjuvant concurrent chemoradiation with weekly carboplatin  and paclitaxel .  Radiation treatment started from 04/21/2023.  Chemotherapy started from 04/25/2023 and received last dose on 05/30/2023.  INTERVAL HISTORY:    Discussed the use of AI scribe software for clinical note transcription with the patient, who gave verbal consent to proceed.  History of Present Illness RODERIC LAMMERT is a 68 year old male with esophageal cancer who presents for follow-up after surgery and ongoing symptoms.  He has a history of esophageal cancer and underwent complex surgery, including stent placement and removal. Complications such as a leak and infection required multiple hospitalizations, including a transfer to [facility] for further management.  He experiences a persistent cough that occurs after eating or drinking, and sometimes spontaneously, lasting up to 45 minutes. The expectorated material is described as 'clear and foamy' or 'light yellow and thick.' No fever is present, but coughing fits can occur at night and upon waking.  He is receiving total parenteral nutrition (TPN) through a PICC line, managed by home health services. The dressing for the PICC line was last changed on Friday, and there have been issues with one of the caps being stuck for the past two to three weeks. He also has a port that has not been used for blood draws due to difficulties in obtaining blood, despite previous use during chemotherapy.  His appetite is affected by certain smells and foods that do not agree with him, but most foods he consumes do not cause problems.  Recent blood work shows a white blood cell count of 13,000, hemoglobin  of 11.4, and creatinine of 1.51. He has a history of elevated alkaline phosphatase, possibly due to liver pressure.    I have reviewed the past medical history, past surgical history, social  history and family history with the patient and they are unchanged from previous note.  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 Larene after he presented to his PCP with complaints of dysphagia and weight loss.  He started noticing difficulty in swallowing in January or February 2024.  He also reported unintentional weight loss of about 20 pounds in 1 year.  Previously he used to take ranitidine for intermittent acid reflux but it was stopped approximately in 2018.  He was started on omeprazole and an EGD was planned for further evaluation.  Also screening colonoscopy was recommended since last colonoscopy was in 2012.   03/11/2023 Pathology Results   Biopsy of stomach/gastric cardia: Cardiac mucosa showing at least intramucosal adenocarcinoma.  Biopsy of esophageal mass: Invasive, moderately differentiated adenocarcinoma.   03/11/2023 Procedure   EGD with biopsy by Dr.Misenheimer: Large mass in the distal esophagus between 30 and 38 cm.  Probable infiltration of the mass in the gastric cardia.  Colonoscopy on 03/11/2023: Mild diverticular disease in the left colon.   03/14/2023 Imaging   CT chest, abdomen and pelvis: Circumferential, masslike thickening in the lower third of esophagus, 7 cm in length, consistent with known primary esophageal malignancy.  Enlarged right paraesophageal lymph node measuring 1.2 x 0.8 cm, consistent with nodal metastatic disease.  No other evidence of lymphadenopathy or metastatic disease in the chest, abdomen or pelvis.  Background of very fine centrilobular pulmonary nodules, most concentrated in the lung apices as well as mild diffuse bronchial wall thickening, most commonly seen in smoking-related respiratory bronchiolitis.  Prostatomegaly.   04/04/2023  Procedure   EUS by Dr.Hung: cT3 or cT4, N1/N2,Mx disease.   04/07/2023 PET scan   PET scan: distal esophageal/GE junction adenocarcinoma.  Lower paraesophageal nodal metastasis.  No evidence of distant metastatic disease.   04/24/2023 Cancer Staging   Staging form: Esophagus - Adenocarcinoma, AJCC 8th Edition - Clinical: Stage III (cT3, cN1, cM0, G2) - Signed by Autumn Millman, MD on 04/24/2023 Histologic grading system: 3 grade system   04/25/2023 - 05/30/2023 Chemotherapy   Patient is on Treatment Plan : ESOPHAGUS Carboplatin  + Paclitaxel  Weekly X 6 Weeks with XRT     06/30/2023 PET scan   Substantial interval improvement in hypermetabolic activity within the distal esophagus, consistent with response to therapy. No residual or progressive adjacent nodal disease demonstrated. No evidence of metastatic disease.   01/01/2024 Cancer Staging   Staging form: Esophagus - Adenocarcinoma, AJCC 8th Edition - Pathologic: Stage I (ypT1b, ypN0, cM0, G2) - Signed by Autumn Millman, MD on 01/01/2024 Stage prefix: Post-therapy Total positive nodes: 0 Histologic grading system: 3 grade system Residual tumor (R): R0   01/28/2024 -  Chemotherapy   Patient is on Treatment Plan : GASTROESOPHAGEAL Nivolumab (240) q14d x 8 cycles / Nivolumab (480) q28d       On 07/28/2023, Dr. Shyrl performed Ivor Lewis esophagectomy, pyloromyotomy, laparoscopic jejunostomy placement.  Final pathology showed very focal minimal residual adenocarcinoma predominantly in the background of scar, near complete response, score 1 of 3.margins were negative.  14 lymph nodes were removed and they were negative for malignancy. ypT1b,pN0.  The patient's post operative course was long and complicated.  He developed delayed anastomotic leak and sepsis with empyema.  He required esophageal stent placement which was attempted to be removed in May 2025, however EGD revealed continued leak requiring replacement of stent.  He was transferred to  [facility] for GI evaluation.  He was able to have his stent removed on 11/27/23.  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  He ultimately required TPN and Tube feedings to maintain nutritional status.  He eventually had a jejunostomy tube removed on 12/09/2023.  Plan for restaging CT chest abdomen and pelvis.  Plan for adjuvant treatments with nivolumab  every 2 weeks for 8 cycles followed by every 4 weeks.   REVIEW OF SYSTEMS:   Review of Systems - Oncology  All other pertinent systems were reviewed with the patient and are negative.  ALLERGIES: He has no known allergies.  MEDICATIONS:  Current Outpatient Medications  Medication Sig Dispense Refill   acetaminophen  (TYLENOL ) 325 MG tablet Place 2 tablets (650 mg total) into feeding tube every 6 (six) hours as needed for mild pain (pain score 1-3) (or Fever >/= 101).     amLODipine  (NORVASC ) 10 MG tablet Crush and place 1 tablet (10 mg total) into feeding tube daily. 30 tablet 1   atorvastatin  (LIPITOR) 20 MG tablet Crush and place 1 tablet (20 mg total) into feeding tube daily. 30 tablet 1   chlorpheniramine-HYDROcodone (TUSSIONEX) 10-8 MG/5ML Take 5 mLs by mouth every 12 (twelve) hours as needed for cough. 473 mL 0   doxazosin  (CARDURA ) 2 MG tablet Crush and place 1 tablet (2 mg total) into feeding tube daily. 30 tablet 1   escitalopram  (LEXAPRO ) 5 MG tablet Crush and place 1 tablet (5 mg total) into feeding tube daily. 30 tablet 1   famotidine  (PEPCID ) 20 MG tablet Crush and place 1 tablet (20 mg total) into feeding tube daily. 30 tablet 1   fiber (NUTRISOURCE FIBER) PACK packet Place 1 packet into feeding tube 2 (two) times daily. 75 each 1   folic acid  (FOLVITE ) 1 MG tablet Crush and place 1 tablet (1 mg total) into feeding tube daily. 30 tablet 1   insulin  glargine (LANTUS ) 100 UNIT/ML Solostar Pen Inject 25 Units into the skin daily. (Patient  taking differently: Inject 21 Units into the skin daily.) 9 mL 2   insulin  lispro (HUMALOG ) 100 UNIT/ML KwikPen Inject 0-20 Units into the skin every 4 (four) hours. CBG 70 - 120: 0 units  CBG 121 - 150:  3 units  CBG 151 - 200: 4 units  CBG 201 - 250: 7 units CBG 251 - 300:  11 units   CBG 301 - 350: 15 units CBG 351 - 400: 20 units 15 mL 11   Insulin  Pen Needle 32G X 4 MM MISC Use with insulin  pens 200 each 0   liver oil-zinc  oxide (DESITIN) 40 % ointment Apply topically 3 (three) times daily as needed for irritation. 56.7 g 0   metoprolol  tartrate (LOPRESSOR ) 25 MG tablet Crush and place 1 tablet (25 mg total) into feeding tube 2 (two) times daily. 60 tablet 1   Multiple Vitamin (MULTIVITAMIN WITH MINERALS) TABS tablet Crush and place 1 tablet into feeding tube daily. 30 tablet 1   Nutritional Supplements (VIVONEX RTF) LIQD Take 45 mLs by mouth every hour.     Nystatin (GERHARDT'S BUTT CREAM) CREA Apply 1 Application topically 3 (  three) times daily. 1 each 1   ondansetron  (ZOFRAN ) 4 MG tablet Crush and place 1 tablet (4 mg total) into feeding tube every 6 (six) hours as needed for nausea. 20 tablet 0   oxyCODONE  (OXY IR/ROXICODONE ) 5 MG immediate release tablet Crush and place 1 tablet (5 mg total) into feeding tube every 6 (six) hours as needed for moderate pain (pain score 4-6). Do not take if the Tussionex (medication for cough) has been taken within the previous 6 hours. 28 tablet 0   Protein (FEEDING SUPPLEMENT, PROSOURCE TF20,) liquid Place 60 mLs into feeding tube 2 (two) times daily.     saccharomyces boulardii (FLORASTOR) 250 MG capsule Open 1 capsule (250 mg total) and put into feeding tube 2 (two) times daily. 60 capsule 1   simethicone  (MYLICON) 40 MG/0.6ML drops Place 1.2 mLs (80 mg total) into feeding tube 4 (four) times daily as needed (for bloating, or excessive gas.). 30 mL 0   thiamine  (VITAMIN B1) 100 MG tablet Crush and place 1 tablet (100 mg total) into feeding tube daily. 30  tablet 1   Water  For Irrigation, Sterile (FREE WATER ) SOLN Place 250 mLs into feeding tube in the morning, at noon, in the evening, and at bedtime.     lidocaine -prilocaine  (EMLA ) cream Apply to affected area once 30 g 3   ondansetron  (ZOFRAN ) 8 MG tablet Take 1 tablet (8 mg total) by mouth every 8 (eight) hours as needed for nausea or vomiting. 30 tablet 1   prochlorperazine  (COMPAZINE ) 10 MG tablet Take 1 tablet (10 mg total) by mouth every 6 (six) hours as needed for nausea or vomiting. 30 tablet 1   No current facility-administered medications for this visit.     VITALS:   Blood pressure 120/78, pulse (!) 109, temperature 98.6 F (37 C), temperature source Temporal, resp. rate 18, height 5' 10 (1.778 m), weight 163 lb 11.2 oz (74.3 kg), SpO2 95%.  Wt Readings from Last 3 Encounters:  12/31/23 163 lb 11.2 oz (74.3 kg)  12/19/23 166 lb 1.6 oz (75.3 kg)  12/09/23 171 lb 8 oz (77.8 kg)    Body mass index is 23.49 kg/m.   Onc Performance Status - 12/31/23 1503       ECOG Perf Status   ECOG Perf Status Restricted in physically strenuous activity but ambulatory and able to carry out work of a light or sedentary nature, e.g., light house work, office work      KPS SCALE   KPS % SCORE Normal activity with effort, some s/s of disease           PHYSICAL EXAM:   Physical Exam Constitutional:      General: He is not in acute distress.    Appearance: Normal appearance.  HENT:     Head: Normocephalic and atraumatic.  Eyes:     General: No scleral icterus.    Conjunctiva/sclera: Conjunctivae normal.  Cardiovascular:     Rate and Rhythm: Normal rate and regular rhythm.     Heart sounds: Normal heart sounds.  Pulmonary:     Effort: Pulmonary effort is normal.     Breath sounds: Normal breath sounds.  Chest:     Comments: Right-sided Port-A-Cath in place without any signs of infection  Multiple scars from surgery are well-healed Abdominal:     General: There is no  distension.  Musculoskeletal:     Right lower leg: No edema.     Left lower leg: No edema.  Neurological:  General: No focal deficit present.     Mental Status: He is alert and oriented to person, place, and time.  Psychiatric:        Mood and Affect: Mood normal.        Behavior: Behavior normal.        Thought Content: Thought content normal.      LABORATORY DATA:   I have reviewed the data as listed.   Results for orders placed or performed in visit on 12/31/23 (from the past 72 hours)  CBC with Differential (Cancer Center Only)     Status: Abnormal   Collection Time: 12/31/23  2:10 PM  Result Value Ref Range   WBC Count 13.3 (H) 4.0 - 10.5 K/uL   RBC 3.94 (L) 4.22 - 5.81 MIL/uL   Hemoglobin 11.4 (L) 13.0 - 17.0 g/dL   HCT 64.6 (L) 60.9 - 47.9 %   MCV 89.6 80.0 - 100.0 fL   MCH 28.9 26.0 - 34.0 pg   MCHC 32.3 30.0 - 36.0 g/dL   RDW 86.4 88.4 - 84.4 %   Platelet Count 225 150 - 400 K/uL   nRBC 0.0 0.0 - 0.2 %   Neutrophils Relative % 74 %   Neutro Abs 9.9 (H) 1.7 - 7.7 K/uL   Lymphocytes Relative 18 %   Lymphs Abs 2.4 0.7 - 4.0 K/uL   Monocytes Relative 6 %   Monocytes Absolute 0.8 0.1 - 1.0 K/uL   Eosinophils Relative 1 %   Eosinophils Absolute 0.1 0.0 - 0.5 K/uL   Basophils Relative 1 %   Basophils Absolute 0.1 0.0 - 0.1 K/uL   Immature Granulocytes 0 %   Abs Immature Granulocytes 0.05 0.00 - 0.07 K/uL    Comment: Performed at [facility], [address], [place], [place] [address]  Phosphorus     Status: None   Collection Time: 12/31/23  2:35 PM  Result Value Ref Range   Phosphorus 3.5 2.5 - 4.6 mg/dL    Comment: Performed at [facility], [address] [place], [place] [address]  Magnesium      Status: None   Collection Time: 12/31/23  2:35 PM  Result Value Ref Range   Magnesium  2.0 1.7 - 2.4 mg/dL    Comment: Performed at [facility], [address], [place], [place] [address]   CMP (Cancer Center only)     Status: Abnormal   Collection Time: 12/31/23  2:35 PM  Result Value Ref Range   Sodium 139 135 - 145 mmol/L   Potassium 4.0 3.5 - 5.1 mmol/L   Chloride 102 98 - 111 mmol/L   CO2 30 22 - 32 mmol/L   Glucose, Bld 175 (H) 70 - 99 mg/dL    Comment: Glucose reference range applies only to samples taken after fasting for at least 8 hours.   BUN 29 (H) 8 - 23 mg/dL   Creatinine 8.48 (H) 9.38 - 1.24 mg/dL   Calcium  9.3 8.9 - 10.3 mg/dL   Total Protein 8.2 (H) 6.5 - 8.1 g/dL   Albumin  3.6 3.5 - 5.0 g/dL   AST 20 15 - 41 U/L   ALT 16 0 - 44 U/L   Alkaline Phosphatase 249 (H) 38 - 126 U/L   Total Bilirubin 0.4 0.0 - 1.2 mg/dL   GFR, Estimated 50 (L) >60 mL/min    Comment: (NOTE) Calculated using the CKD-EPI Creatinine Equation (2021)    Anion gap 7 5 - 15  Comment: Performed at [facility], [address] [place], [place] [address]     RADIOGRAPHIC STUDIES:  DG Chest 2 View Result Date: 12/09/2023 CLINICAL DATA:  Chest tube removal. EXAM: CHEST - 2 VIEW COMPARISON:  Radiographs 11/12/2023 and 10/03/2023.  CT 10/08/2023. FINDINGS: 1439 hours. Right IJ Port-A-Cath is unchanged, at the level of the superior cavoatrial junction. Left arm PICC is unchanged at the level of the upper right atrium. Right chest tube and esophageal stent have been removed in the interval. Embolization coils superior to the aortic arch from previous thoracic duct embolization. The heart size and mediastinal contours are stable. Small left greater than right pleural effusions, mildly improved on the left. There is no pneumothorax or confluent airspace disease. IMPRESSION: 1. Interval removal of right chest tube and esophageal stent. No pneumothorax. 2. Small left greater than right pleural effusions, mildly improved on the left. Electronically Signed   By: Elsie Perone M.D.   On: 12/09/2023 14:49     CODE STATUS: Full code.   Orders Placed This Encounter   Procedures   Consent Attestation for Oncology Treatment    The patient is informed of risks, benefits, side-effects of the prescribed oncology treatment. Potential short term and long term side effects and response rates discussed. After a long discussion, the patient made informed decision to proceed.:   Yes   CT CHEST ABDOMEN PELVIS W CONTRAST    Standing Status:   Future    Expected Date:   01/06/2024    Expiration Date:   12/30/2024    If indicated for the ordered procedure, I authorize the administration of contrast media per Radiology protocol:   Yes    Does the patient have a contrast media/X-ray dye allergy?:   No    Preferred imaging location?:   [facility]    If indicated for the ordered procedure, I authorize the administration of oral contrast media per Radiology protocol:   Yes   CBC with Differential (Cancer Center Only)    Standing Status:   Future    Expected Date:   01/28/2024    Expiration Date:   01/27/2025   CMP (Cancer Center only)    Standing Status:   Future    Expected Date:   01/28/2024    Expiration Date:   01/27/2025   T4    Standing Status:   Future    Expected Date:   01/28/2024    Expiration Date:   01/27/2025   TSH    Standing Status:   Future    Expected Date:   01/28/2024    Expiration Date:   01/27/2025   CBC with Differential (Cancer Center Only)    Standing Status:   Future    Expected Date:   02/11/2024    Expiration Date:   02/10/2025   CMP (Cancer Center only)    Standing Status:   Future    Expected Date:   02/11/2024    Expiration Date:   02/10/2025   CBC with Differential (Cancer Center Only)    Standing Status:   Future    Expected Date:   02/25/2024    Expiration Date:   02/24/2025   CMP (Cancer Center only)    Standing Status:   Future    Expected Date:   02/25/2024    Expiration Date:   02/24/2025   T4    Standing Status:   Future    Expected Date:   02/25/2024  Expiration Date:   02/24/2025   TSH    Standing Status:   Future     Expected Date:   02/25/2024    Expiration Date:   02/24/2025   [facility] PHYSICIAN COMMUNICATION 1    Thyroid function tests at baseline and every 3rd cycle.   [facility] [place] PHYSICIAN COMMUNICATION 2    For Via Pathway [id number], treatment for up to a total of 1 Year.     Future Appointments  Date Time Provider Department Center  01/02/2024  2:20 PM Shyrl Linnie KIDD, MD [facility] H&V  01/06/2024  8:00 AM [facility] 1 [facility] [place]  01/28/2024  2:00 PM [facility] FLUSH [facility] None  01/28/2024  2:30 PM Keyla Milone, MD [facility] None  01/28/2024  2:45 PM [facility] INFUSION [facility] None  02/11/2024  2:15 PM [facility] [facility] FLUSH [facility] None  02/11/2024  2:45 PM Ward Boissonneault, MD [facility] None  02/11/2024  3:00 PM [facility] INFUSION [facility] None  02/25/2024  2:15 PM [facility] FLUSH [facility] None  02/25/2024  2:45 PM Firas Guardado, MD [facility] None  02/25/2024  3:00 PM [facility] INFUSION [facility] None      This document was completed utilizing speech recognition software. Grammatical errors, random word insertions, pronoun errors, and incomplete sentences are an occasional consequence of this system due to software limitations, ambient noise, and hardware issues. Any formal questions or concerns about the content, text or information contained within the body of this dictation should be directly addressed to the provider for clarification.

## 2024-01-01 ENCOUNTER — Encounter: Payer: Self-pay | Admitting: Oncology

## 2024-01-01 ENCOUNTER — Other Ambulatory Visit: Payer: Self-pay

## 2024-01-01 NOTE — Assessment & Plan Note (Signed)
 Please review oncology history for additional details.  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  Concern for lymph node involvement based on CT scan.   - On 04/04/2023, Dr. Rollin performed upper EUS.  It was at least T3, N 1/2, MX by endosonographic criteria.   -Previously ordered additional testing on biopsy specimen for potential targeted therapy options including MMR testing, PD-L1 testing  -Staging PET scan on 04/07/2023 showed distal esophageal/GE junction adenocarcinoma.  Lower paraesophageal nodal metastasis.  No evidence of distant metastatic disease.  -Referral sent to cardiothoracic surgeon Dr. Shyrl and to Dr. Dewey, radiation oncologist for concurrent chemoradiation planning in the neoadjuvant setting.  -He started radiation treatments under the direction of Dr. Dewey from 04/21/2023.  He started chemotherapy with carboplatin  and paclitaxel  from 04/25/2023.  Completed last dose of chemotherapy on 05/30/2023.  -Restaging PET scan from 06/30/2023 showed excellent response with substantial interval improvement in hypermetabolic activity within the distal esophagus, indicating a positive response to therapy.  FDG activity improved from 26 to 5.7, suggesting decreased tumor activity. No residual or progressive nodal disease or signs of disease elsewhere.   On 07/28/2023, Dr. Shyrl performed Ivor Lewis esophagectomy, pyloromyotomy, laparoscopic jejunostomy placement.  Final pathology showed very focal minimal residual adenocarcinoma predominantly in the background of scar, near complete response, score 1 of 3.margins were negative.  14 lymph nodes were removed and they were negative for malignancy. ypT1b,pN0.   The patient's post operative course was long and complicated.  He developed delayed anastomotic leak and sepsis with empyema.  He required esophageal stent placement which was attempted to be removed in May 2025, however EGD revealed continued  leak requiring replacement of stent.  He was transferred to [facility] for GI evaluation.  He was able to have his stent removed on 11/27/23.  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  He ultimately required TPN and Tube feedings to maintain nutritional status.  He eventually had a jejunostomy tube removed on 12/09/2023.  Reviewed relevant so far with the patient and his wife in detail.  Plan for restaging CT chest abdomen and pelvis.  Plan for adjuvant treatments with nivolumab every 2 weeks for 8 cycles followed by every 4 weeks.   -He was advised to continue omeprazole daily.  Briefly discussed side effect profile with nivolumab treatments including immune mediated side effects.  Patient would prefer to wait at least 4 more weeks before starting treatments, as he will be going out of town on a beach trip.  Will arrange appointments accordingly.

## 2024-01-02 ENCOUNTER — Ambulatory Visit: Admitting: Thoracic Surgery (Cardiothoracic Vascular Surgery)

## 2024-01-02 NOTE — Progress Notes (Unsigned)
     [address]       [name] [address]             [phone]       Patient: Home Provider: Office Consent for Telemedicine visit obtained.  Today's visit was completed via a real-time telehealth (see specific modality noted below). The patient/authorized person provided oral consent at the time of the visit to engage in a telemedicine encounter with the present provider at [facility]. The patient/authorized person was informed of the potential benefits, limitations, and risks of telemedicine. The patient/authorized person expressed understanding that the laws that protect confidentiality also apply to telemedicine. The patient/authorized person acknowledged understanding that telemedicine does not provide emergency services and that he or she would need to call 911 or proceed to the nearest hospital for help if such a need arose.   Total time spent in the clinical discussion 10 minutes.  Telehealth Modality: Phone visit (audio only)  I had a telephone visit with Seth Burch ***

## 2024-01-05 ENCOUNTER — Telehealth: Payer: Self-pay | Admitting: *Deleted

## 2024-01-05 NOTE — Telephone Encounter (Signed)
 Pt's wife called back to schedule NP Preop appt. Please call her to schedule, rather than pt. Please advise.

## 2024-01-05 NOTE — Telephone Encounter (Signed)
 I s/w the surgery scheduler for Dr. Francois that our office attempted x 3 to reach the pt to schedule a new pt appt. Pt's wife called back today. I informed the requesting office that we do not have a new pt appt until 01/13/24. RN said she will let the team know they will need to post pone pt's procedure until he has been cleared by cardiology.

## 2024-01-05 NOTE — Telephone Encounter (Signed)
 S/w the pt and his wife and they agreed to plan of care; new pt appt Glendia Ferrier, [facility] 01/13/24 @ 11 am.   I will update all parties involved.

## 2024-01-05 NOTE — Telephone Encounter (Signed)
 Patient's wife called stating patient's PICC line was kinked and no longer functioning. States he has not had TPN since Thursday. States he has lot a little weight. States today's weight was 159lb. Per Dr. Shyrl, Dene Caldron with [facility] notified to pull PICC line. [facility] also notified that TPN is no longer needed. Patient and wife made aware.

## 2024-01-06 ENCOUNTER — Ambulatory Visit (HOSPITAL_COMMUNITY)
Admission: RE | Admit: 2024-01-06 | Discharge: 2024-01-06 | Disposition: A | Discharge status: Home / Self Care | Source: Ambulatory Visit | Attending: Oncology | Admitting: Oncology

## 2024-01-06 DIAGNOSIS — C155 Malignant neoplasm of lower third of esophagus: Secondary | ICD-10-CM | POA: Diagnosis present

## 2024-01-06 LAB — POCT I-STAT CREATININE: Creatinine, Ser: 1.7 mg/dL — ABNORMAL HIGH (ref 0.61–1.24)

## 2024-01-06 MED ORDER — IOHEXOL 300 MG/ML  SOLN
100.0000 mL | Freq: Once | INTRAMUSCULAR | Status: AC | PRN
  Administered 2024-01-06: 80 mL via INTRAVENOUS

## 2024-01-09 ENCOUNTER — Ambulatory Visit
Attending: Thoracic Surgery (Cardiothoracic Vascular Surgery) | Admitting: Thoracic Surgery (Cardiothoracic Vascular Surgery)

## 2024-01-09 DIAGNOSIS — Z9889 Other specified postprocedural states: Secondary | ICD-10-CM

## 2024-01-09 DIAGNOSIS — Z9049 Acquired absence of other specified parts of digestive tract: Secondary | ICD-10-CM

## 2024-01-09 NOTE — Progress Notes (Signed)
     [address]       [name] [address]             [phone]       Patient: Home Provider: Office Consent for Telemedicine visit obtained.  Today's visit was completed via a real-time telehealth (see specific modality noted below). The patient/authorized person provided oral consent at the time of the visit to engage in a telemedicine encounter with the present provider at [facility]. The patient/authorized person was informed of the potential benefits, limitations, and risks of telemedicine. The patient/authorized person expressed understanding that the laws that protect confidentiality also apply to telemedicine. The patient/authorized person acknowledged understanding that telemedicine does not provide emergency services and that he or she would need to call 911 or proceed to the nearest hospital for help if such a need arose.   Total time spent in the clinical discussion 10 minutes.  Telehealth Modality: Phone visit (audio only)  I had a telephone visit with Vicenta FORBES Silvan Doing well with eating.  He is off of the TPN now.  He continues to have some coughing spells, but is working with his food choices to help with this.  He would like his picc line removed.  He has a cardiology appointment on 8/26.  If home health has not removed it, he can come to our clinic for removal.  Linnie MALVA Rayas

## 2024-01-13 ENCOUNTER — Encounter: Payer: Self-pay | Admitting: Physician Assistant

## 2024-01-13 ENCOUNTER — Ambulatory Visit: Attending: Physician Assistant | Admitting: Physician Assistant

## 2024-01-13 ENCOUNTER — Ambulatory Visit: Attending: Thoracic Surgery (Cardiothoracic Vascular Surgery) | Admitting: Physician Assistant

## 2024-01-13 VITALS — BP 114/77 | HR 92 | Resp 18 | Ht 70.0 in | Wt 159.0 lb

## 2024-01-13 VITALS — BP 86/62 | HR 108 | Ht 70.0 in | Wt 159.0 lb

## 2024-01-13 DIAGNOSIS — C159 Malignant neoplasm of esophagus, unspecified: Secondary | ICD-10-CM | POA: Diagnosis not present

## 2024-01-13 DIAGNOSIS — Z452 Encounter for adjustment and management of vascular access device: Secondary | ICD-10-CM

## 2024-01-13 DIAGNOSIS — I10 Essential (primary) hypertension: Secondary | ICD-10-CM

## 2024-01-13 DIAGNOSIS — R0602 Shortness of breath: Secondary | ICD-10-CM | POA: Diagnosis not present

## 2024-01-13 DIAGNOSIS — Z01810 Encounter for preprocedural cardiovascular examination: Secondary | ICD-10-CM | POA: Diagnosis not present

## 2024-01-13 NOTE — Progress Notes (Signed)
 OFFICE NOTE:    Date:  01/13/2024  ID:  Seth Burch, DOB 06/26/1955, MRN [id number] PCP: Seth Axon, MD  [facility] Providers Cardiologist:  None       Coronary artery Ca2+ (CT 08/2023) Pharma MPI 07/18/23: no ischemia or infarction, EF62, low risk  IgG Kappa MGUS Esophageal CA  XR, Chemo s/p Ivor Lewis esophagectomy 07/2023 C/b anastomotic leak, sepsis, empyema; s/p esophageal stent >> removed at [facility] 11/2023 Diabetes mellitus  Hypertension  Hyperlipidemia  Chronic kidney disease  Aortic atherosclerosis        Discussed the use of AI scribe software for clinical note transcription with the patient, who gave verbal consent to proceed. History of Present Illness Seth Burch is a 68 y.o. male who is referred by Seth Axon, MD for surgical clearance. He need EGD with Dr. Francois at [facility] 01/15/24.   He has difficulty with physical exertion, such as walking a block or climbing stairs, due to shortness of breath. He can manage four steps at home but becomes short of breath with more exertion, such as walking around a store. He reports coughing since the esophageal surgery. He recently started doxycycline for suspected bronchitis. Since starting the antibiotic, he has experienced nausea, vomiting, and diarrhea, which he believes are side effects of the medication. These symptoms began shortly after starting the medication and worsen after taking it, even when taken with food.  No fever, chest pain, or syncope. He experiences dry throat and mouth, and his oxygen levels drop with exertion and coughing.   He denies alcohol , drug use, and smoking.    ROS-See HPI    Studies Reviewed:  EKG Interpretation Date/Time:  Tuesday January 13 2024 10:42:46 EDT Ventricular Rate:  104 PR Interval:  120 QRS Duration:  78 QT Interval:  342 QTC Calculation: 449 R Axis:   50  Text Interpretation: Sinus tachycardia When compared with ECG of 20-Oct-2023 14:18, No significant  change was found Confirmed by Lelon Hamilton [id number]) on 01/13/2024 10:55:57 AM          Physical Exam:  VS:  BP (!) 86/62 (BP Location: Right Arm, Patient Position: Sitting, Cuff Size: Normal)   Pulse (!) 108   Ht 5' 10 (1.778 m)   Wt 159 lb (72.1 kg)   SpO2 92%   BMI 22.81 kg/m        Wt Readings from Last 3 Encounters:  01/13/24 159 lb (72.1 kg)  01/13/24 159 lb (72.1 kg)  12/31/23 163 lb 11.2 oz (74.3 kg)    Constitutional:      Appearance: Not in distress. Frail. Chronically ill-appearing.  Neck:     Vascular: No JVR. JVD normal.  Pulmonary:     Breath sounds: No wheezing.     Comments: Decreased breath sounds R lung with crackles 1/3 up chest Cardiovascular:     Tachycardia present. Regular rhythm.     Murmurs: There is no murmur.  Edema:    Peripheral edema absent.  Abdominal:     Palpations: Abdomen is soft.  Skin:    General: Skin is warm and dry.        Assessment and Plan:    Assessment & Plan Preoperative cardiovascular examination [name] perioperative risk of a major cardiac event is 0.9% according to the Revised Cardiac Risk Index (RCRI) which indicates low risk for perioperative complications.   His functional capacity is poor at 3.3 METs according to the Duke Activity Status Index (DASI). He had  a nuclear stress test in Feb of this year that was low risk. He is not having any chest pain to suggest angina. He has been short of breath since his initial surgery for cancer. His surgery was complicated by sepsis, empyema. He is weak and debilitated from his cancer. Based on his exam, I had some concern that he has a R pleural effusion. I was able to review his recent CT with Dr. Wonda (attending MD). There was no significant effusion on the R and mild L effusion noted. I reviewed his case with Dr. Wonda as well. Endoscopy is a low risk procedure from a CV standpoint. He is at low risk for CV complications and can proceed without further testing. He was placed  on antibiotics for possible bronchitis. He has been having nausea and vomiting since starting on Doxycycline. I have asked him to to follow up with primary care regarding this.  Shortness of breath He has been short of breath since his initial surgery. I suspect this is related to his complications post op. His EF was normal on nuclear stress testing in Feb 2025. He notes hypoxia with activities at times. It may be beneficial to eventually see pulmonology to evaluate his hypoxia. Follow up with primary care for adjustment of antibiotics given side effects with Doxycycline.  Essential hypertension His blood pressure is running low today in the setting of nausea and vomiting. I have asked him to push fluids, salt and to hold Amlodipine  until his BP recovers.  Malignant neoplasm of esophagus, unspecified location [facility]) S/p esophagectomy, chemotherapy, radiation. Follow up with CT surgery, oncology as planned.          Dispo:  Return for for follow up as needed .  Signed, Glendia Ferrier, PA-C

## 2024-01-13 NOTE — Patient Instructions (Addendum)
 Medication Instructions:  Your physician recommends that you continue on your current medications as directed. Please refer to the Current Medication list given to you today. BUT, HOLD THE AMLODIPINE  until your blood pressure comes back up, to at least 120/80.  *If you need a refill on your cardiac medications before your next appointment, please call your pharmacy*  Lab Work: None ordered  If you have labs (blood work) drawn today and your tests are completely normal, you will receive your results only by: MyChart Message (if you have MyChart) OR A paper copy in the mail If you have any lab test that is abnormal or we need to change your treatment, we will call you to review the results.  Testing/Procedures: None ordered  Follow-Up: At [facility], you and your health needs are our priority.  As part of our continuing mission to provide you with exceptional heart care, our providers are all part of one team.  This team includes your primary Cardiologist (physician) and Advanced Practice Providers or APPs (Physician Assistants and Nurse Practitioners) who all work together to provide you with the care you need, when you need it.  Your next appointment:   As Needed  Provider:   Glendia Ferrier, PA-C          We recommend signing up for the patient portal called MyChart.  Sign up information is provided on this After Visit Summary.  MyChart is used to connect with patients for Virtual Visits (Telemedicine).  Patients are able to view lab/test results, encounter notes, upcoming appointments, etc.  Non-urgent messages can be sent to your provider as well.   To learn more about what you can do with MyChart, go to [email or web address].   Other Instructions Dr. Lang office is on the 4th floor.  Stop by there on your way out.

## 2024-01-13 NOTE — Assessment & Plan Note (Signed)
 S/p esophagectomy, chemotherapy, radiation. Follow up with CT surgery, oncology as planned.

## 2024-01-13 NOTE — Patient Instructions (Signed)
 Follow up in 3 weeks for PICC removal

## 2024-01-13 NOTE — Progress Notes (Signed)
 HPI: [name] is a 19 with history of distal esophageal carcinoma status post minimally invasive esophagectomy by Dr. Shyrl in March of this year.  His postoperative course was complicated by an anastomotic leak and he required prolonged nutritional support both by way of jejunostomy and with TPN by way of a PICC line.  The anastomotic leak is resolved and he is able to maintain adequate nutrition with oral intake.  He had a virtual visit with Dr. Shyrl last week and they discussed removing PICC line.  He presents to the office today for PICC line removal. [name] and his wife report that he has been off TPN for about 1 week now.  He has had some nausea and abdominal discomfort recently that he thinks is related to doxycycline ordered by another provider.  Doxycycline was discontinued today so there has not been enough time to ensure that that was the source of his discomfort and nausea.  He is also going to be starting immunotherapy at the oncologist office in a couple of weeks he is not sure how this is going to affect his appetite and oral intake.  He also expressed concern about removing the catheter until these issues are sorted out and I agree. He no longer has home health services but he and his wife have a supply of saline and heparin  flushes syringes.  I will contact the IV team to get specifics on how frequently and at what volume flushes should be accomplished.    Current Outpatient Medications  Medication Sig Dispense Refill   acetaminophen  (TYLENOL ) 325 MG tablet Place 2 tablets (650 mg total) into feeding tube every 6 (six) hours as needed for mild pain (pain score 1-3) (or Fever >/= 101). (Patient not taking: Reported on 01/13/2024)     amLODipine  (NORVASC ) 10 MG tablet Crush and place 1 tablet (10 mg total) into feeding tube daily. (Patient taking differently: Place 5 mg into feeding tube daily.) 30 tablet 1   atorvastatin  (LIPITOR) 20 MG tablet Crush and place 1  tablet (20 mg total) into feeding tube daily. 30 tablet 1   chlorpheniramine-HYDROcodone (TUSSIONEX) 10-8 MG/5ML Take 5 mLs by mouth every 12 (twelve) hours as needed for cough. 473 mL 0   doxazosin  (CARDURA ) 2 MG tablet Crush and place 1 tablet (2 mg total) into feeding tube daily. (Patient not taking: Reported on 01/13/2024) 30 tablet 1   doxycycline (VIBRAMYCIN) 100 MG capsule Take 1 capsule every 12 hours by oral route for 10 days.     escitalopram  (LEXAPRO ) 5 MG tablet Crush and place 1 tablet (5 mg total) into feeding tube daily. 30 tablet 1   famotidine  (PEPCID ) 20 MG tablet Crush and place 1 tablet (20 mg total) into feeding tube daily. 30 tablet 1   fiber (NUTRISOURCE FIBER) PACK packet Place 1 packet into feeding tube 2 (two) times daily. 75 each 1   folic acid  (FOLVITE ) 1 MG tablet Crush and place 1 tablet (1 mg total) into feeding tube daily. 30 tablet 1   insulin  glargine (LANTUS ) 100 UNIT/ML Solostar Pen Inject 25 Units into the skin daily. (Patient taking differently: Inject 21 Units into the skin daily.) 9 mL 2   insulin  lispro (HUMALOG ) 100 UNIT/ML KwikPen Inject 0-20 Units into the skin every 4 (four) hours. CBG 70 - 120: 0 units  CBG 121 - 150:  3 units  CBG 151 - 200: 4 units  CBG 201 - 250: 7 units CBG 251 - 300:  11 units   CBG 301 - 350: 15 units CBG 351 - 400: 20 units 15 mL 11   Insulin  Pen Needle 32G X 4 MM MISC Use with insulin  pens 200 each 0   lidocaine -prilocaine  (EMLA ) cream Apply to affected area once 30 g 3   liver oil-zinc  oxide (DESITIN) 40 % ointment Apply topically 3 (three) times daily as needed for irritation. 56.7 g 0   metoprolol  tartrate (LOPRESSOR ) 25 MG tablet Crush and place 1 tablet (25 mg total) into feeding tube 2 (two) times daily. (Patient not taking: Reported on 01/13/2024) 60 tablet 1   Multiple Vitamin (MULTIVITAMIN WITH MINERALS) TABS tablet Crush and place 1 tablet into feeding tube daily. 30 tablet 1   Nutritional Supplements (VIVONEX RTF) LIQD  Take 45 mLs by mouth every hour.     Nystatin (GERHARDT'S BUTT CREAM) CREA Apply 1 Application topically 3 (three) times daily. 1 each 1   omeprazole (PRILOSEC) 40 MG capsule Take 40 mg by mouth daily.     ondansetron  (ZOFRAN ) 4 MG tablet Crush and place 1 tablet (4 mg total) into feeding tube every 6 (six) hours as needed for nausea. 20 tablet 0   ondansetron  (ZOFRAN ) 8 MG tablet Take 1 tablet (8 mg total) by mouth every 8 (eight) hours as needed for nausea or vomiting. 30 tablet 1   oxyCODONE  (OXY IR/ROXICODONE ) 5 MG immediate release tablet Crush and place 1 tablet (5 mg total) into feeding tube every 6 (six) hours as needed for moderate pain (pain score 4-6). Do not take if the Tussionex (medication for cough) has been taken within the previous 6 hours. 28 tablet 0   prochlorperazine  (COMPAZINE ) 10 MG tablet Take 1 tablet (10 mg total) by mouth every 6 (six) hours as needed for nausea or vomiting. 30 tablet 1   Protein (FEEDING SUPPLEMENT, PROSOURCE TF20,) liquid Place 60 mLs into feeding tube 2 (two) times daily.     saccharomyces boulardii (FLORASTOR) 250 MG capsule Open 1 capsule (250 mg total) and put into feeding tube 2 (two) times daily. 60 capsule 1   simethicone  (MYLICON) 40 MG/0.6ML drops Place 1.2 mLs (80 mg total) into feeding tube 4 (four) times daily as needed (for bloating, or excessive gas.). 30 mL 0   tamsulosin  (FLOMAX ) 0.4 MG CAPS capsule Take 0.4 mg by mouth daily.     thiamine  (VITAMIN B1) 100 MG tablet Crush and place 1 tablet (100 mg total) into feeding tube daily. 30 tablet 1   Water  For Irrigation, Sterile (FREE WATER ) SOLN Place 250 mLs into feeding tube in the morning, at noon, in the evening, and at bedtime.     No current facility-administered medications for this visit.    Physical Exam Vital signs BP 114/77 Heart rate 92 Respirations 18 SpO2 94% on room air  General: No distress, bright affect.  Abdomen: The G-tube insertion site is clean and  dry.  Extremities: PICC insertion site in his left upper arm appears healthy.  There is no surrounding erythema or tenderness.  The line is well secured.  Diagnostic Tests: None today   Impression / Plan: Mr. Schrieber is status post minimally invasive esophagectomy in March of this year for distal adenocarcinoma.  He had a prolonged and complex postoperative course related to an anastomotic leak managed with esophageal stenting and prolonged nutritional support by way of the G-tube and TPN by way of the PICC line.  He is only been off TPN for 1 week and has had  some nausea and abdominal discomfort over the past few days felt to be related to the doxycycline prescribed by another provider.  He is scheduled for an EGD later this week at [facility].  He will also be starting immunotherapy in a couple weeks.  I will hold off on removing the PICC line until his nausea and abdominal discomfort have resolved, the EGD has been completed, and we are reassured he is not going to require any more nutritional support.    Jarelis Ehlert G. Kerim Statzer, PA-C [facility] [phone]

## 2024-01-14 ENCOUNTER — Other Ambulatory Visit: Payer: Self-pay

## 2024-01-14 NOTE — Telephone Encounter (Signed)
 Note faxed to surgeon Glendia Ferrier, PA-C    01/14/2024 8:08 AM

## 2024-01-20 ENCOUNTER — Ambulatory Visit: Payer: Self-pay | Admitting: Oncology

## 2024-01-21 NOTE — Progress Notes (Signed)
 Pharmacist Chemotherapy Monitoring - Initial Assessment    Anticipated start date: 01/28/24   The following has been reviewed per standard work regarding the patient's treatment regimen: The patient's diagnosis, treatment plan and drug doses, and organ/hematologic function Lab orders and baseline tests specific to treatment regimen  The treatment plan start date, drug sequencing, and pre-medications Prior authorization status  Patient's documented medication list, including drug-drug interaction screen and prescriptions for anti-emetics and supportive care specific to the treatment regimen The drug concentrations, fluid compatibility, administration routes, and timing of the medications to be used The patient's access for treatment and lifetime cumulative dose history, if applicable  The patient's medication allergies and previous infusion related reactions, if applicable   Changes made to treatment plan:  N/A  Follow up needed:  N/A   Tula Schryver, Pharm.D., CPP 01/21/2024@11 :37 AM

## 2024-01-28 ENCOUNTER — Inpatient Hospital Stay: Admitting: Dietician

## 2024-01-28 ENCOUNTER — Inpatient Hospital Stay: Attending: Oncology

## 2024-01-28 ENCOUNTER — Inpatient Hospital Stay: Admitting: Oncology

## 2024-01-28 ENCOUNTER — Encounter: Payer: Self-pay | Admitting: Oncology

## 2024-01-28 ENCOUNTER — Inpatient Hospital Stay

## 2024-01-28 VITALS — BP 132/83 | HR 86 | Temp 97.4°F | Resp 19 | Ht 70.0 in | Wt 157.0 lb

## 2024-01-28 DIAGNOSIS — N400 Enlarged prostate without lower urinary tract symptoms: Secondary | ICD-10-CM | POA: Insufficient documentation

## 2024-01-28 DIAGNOSIS — D649 Anemia, unspecified: Secondary | ICD-10-CM | POA: Diagnosis not present

## 2024-01-28 DIAGNOSIS — R197 Diarrhea, unspecified: Secondary | ICD-10-CM | POA: Diagnosis not present

## 2024-01-28 DIAGNOSIS — R918 Other nonspecific abnormal finding of lung field: Secondary | ICD-10-CM

## 2024-01-28 DIAGNOSIS — R0989 Other specified symptoms and signs involving the circulatory and respiratory systems: Secondary | ICD-10-CM | POA: Insufficient documentation

## 2024-01-28 DIAGNOSIS — I2510 Atherosclerotic heart disease of native coronary artery without angina pectoris: Secondary | ICD-10-CM | POA: Insufficient documentation

## 2024-01-28 DIAGNOSIS — R0609 Other forms of dyspnea: Secondary | ICD-10-CM | POA: Diagnosis not present

## 2024-01-28 DIAGNOSIS — E1122 Type 2 diabetes mellitus with diabetic chronic kidney disease: Secondary | ICD-10-CM | POA: Insufficient documentation

## 2024-01-28 DIAGNOSIS — C155 Malignant neoplasm of lower third of esophagus: Secondary | ICD-10-CM | POA: Insufficient documentation

## 2024-01-28 DIAGNOSIS — I129 Hypertensive chronic kidney disease with stage 1 through stage 4 chronic kidney disease, or unspecified chronic kidney disease: Secondary | ICD-10-CM | POA: Insufficient documentation

## 2024-01-28 DIAGNOSIS — N189 Chronic kidney disease, unspecified: Secondary | ICD-10-CM | POA: Diagnosis not present

## 2024-01-28 DIAGNOSIS — Z79620 Long term (current) use of immunosuppressive biologic: Secondary | ICD-10-CM | POA: Diagnosis not present

## 2024-01-28 DIAGNOSIS — Z9981 Dependence on supplemental oxygen: Secondary | ICD-10-CM | POA: Diagnosis not present

## 2024-01-28 DIAGNOSIS — Z923 Personal history of irradiation: Secondary | ICD-10-CM | POA: Insufficient documentation

## 2024-01-28 DIAGNOSIS — Z79899 Other long term (current) drug therapy: Secondary | ICD-10-CM | POA: Insufficient documentation

## 2024-01-28 DIAGNOSIS — Z9049 Acquired absence of other specified parts of digestive tract: Secondary | ICD-10-CM | POA: Insufficient documentation

## 2024-01-28 DIAGNOSIS — I700 Atherosclerosis of aorta: Secondary | ICD-10-CM | POA: Diagnosis not present

## 2024-01-28 DIAGNOSIS — C779 Secondary and unspecified malignant neoplasm of lymph node, unspecified: Secondary | ICD-10-CM | POA: Insufficient documentation

## 2024-01-28 DIAGNOSIS — Z934 Other artificial openings of gastrointestinal tract status: Secondary | ICD-10-CM | POA: Diagnosis not present

## 2024-01-28 DIAGNOSIS — J869 Pyothorax without fistula: Secondary | ICD-10-CM | POA: Diagnosis not present

## 2024-01-28 DIAGNOSIS — Z9221 Personal history of antineoplastic chemotherapy: Secondary | ICD-10-CM | POA: Insufficient documentation

## 2024-01-28 DIAGNOSIS — A419 Sepsis, unspecified organism: Secondary | ICD-10-CM | POA: Diagnosis not present

## 2024-01-28 DIAGNOSIS — Z5112 Encounter for antineoplastic immunotherapy: Secondary | ICD-10-CM | POA: Insufficient documentation

## 2024-01-28 DIAGNOSIS — R053 Chronic cough: Secondary | ICD-10-CM | POA: Insufficient documentation

## 2024-01-28 DIAGNOSIS — E785 Hyperlipidemia, unspecified: Secondary | ICD-10-CM | POA: Diagnosis not present

## 2024-01-28 DIAGNOSIS — J90 Pleural effusion, not elsewhere classified: Secondary | ICD-10-CM | POA: Insufficient documentation

## 2024-01-28 DIAGNOSIS — R1310 Dysphagia, unspecified: Secondary | ICD-10-CM | POA: Insufficient documentation

## 2024-01-28 DIAGNOSIS — E46 Unspecified protein-calorie malnutrition: Secondary | ICD-10-CM | POA: Insufficient documentation

## 2024-01-28 LAB — CBC WITH DIFFERENTIAL (CANCER CENTER ONLY)
Abs Immature Granulocytes: 0.04 K/uL (ref 0.00–0.07)
Basophils Absolute: 0 K/uL (ref 0.0–0.1)
Basophils Relative: 0 %
Eosinophils Absolute: 0 K/uL (ref 0.0–0.5)
Eosinophils Relative: 0 %
HCT: 29.4 % — ABNORMAL LOW (ref 39.0–52.0)
Hemoglobin: 9.5 g/dL — ABNORMAL LOW (ref 13.0–17.0)
Immature Granulocytes: 0 %
Lymphocytes Relative: 20 %
Lymphs Abs: 1.9 K/uL (ref 0.7–4.0)
MCH: 27.6 pg (ref 26.0–34.0)
MCHC: 32.3 g/dL (ref 30.0–36.0)
MCV: 85.5 fL (ref 80.0–100.0)
Monocytes Absolute: 0.6 K/uL (ref 0.1–1.0)
Monocytes Relative: 7 %
Neutro Abs: 6.8 K/uL (ref 1.7–7.7)
Neutrophils Relative %: 73 %
Platelet Count: 346 K/uL (ref 150–400)
RBC: 3.44 MIL/uL — ABNORMAL LOW (ref 4.22–5.81)
RDW: 13.8 % (ref 11.5–15.5)
WBC Count: 9.4 K/uL (ref 4.0–10.5)
nRBC: 0 % (ref 0.0–0.2)

## 2024-01-28 LAB — MAGNESIUM: Magnesium: 1.8 mg/dL (ref 1.7–2.4)

## 2024-01-28 LAB — CMP (CANCER CENTER ONLY)
ALT: 13 U/L (ref 0–44)
AST: 18 U/L (ref 15–41)
Albumin: 3.2 g/dL — ABNORMAL LOW (ref 3.5–5.0)
Alkaline Phosphatase: 208 U/L — ABNORMAL HIGH (ref 38–126)
Anion gap: 8 (ref 5–15)
BUN: 14 mg/dL (ref 8–23)
CO2: 27 mmol/L (ref 22–32)
Calcium: 8.7 mg/dL — ABNORMAL LOW (ref 8.9–10.3)
Chloride: 105 mmol/L (ref 98–111)
Creatinine: 1.3 mg/dL — ABNORMAL HIGH (ref 0.61–1.24)
GFR, Estimated: >60 mL/min (ref >60)
Glucose, Bld: 142 mg/dL — ABNORMAL HIGH (ref 70–99)
Potassium: 3.5 mmol/L (ref 3.5–5.1)
Sodium: 140 mmol/L (ref 135–145)
Total Bilirubin: 0.4 mg/dL (ref 0.0–1.2)
Total Protein: 7.1 g/dL (ref 6.5–8.1)

## 2024-01-28 LAB — TSH: TSH: 0.727 u[IU]/mL (ref 0.350–4.500)

## 2024-01-28 MED ORDER — SODIUM CHLORIDE 0.9 % IV SOLN
240.0000 mg | Freq: Once | INTRAVENOUS | Status: AC
  Administered 2024-01-28: 240 mg via INTRAVENOUS
  Filled 2024-01-28: qty 24

## 2024-01-28 MED ORDER — SODIUM CHLORIDE 0.9 % IV SOLN: INTRAVENOUS | Status: DC

## 2024-01-28 NOTE — Patient Instructions (Signed)
 [facility] - A DEPT OF [facility]  Discharge Instructions: Thank you for choosing [facility] to provide your oncology and hematology care.   If you have a lab appointment with the Cancer Center, please go directly to the Cancer Center and check in at the registration area.   Wear comfortable clothing and clothing appropriate for easy access to any Portacath or PICC line.   We strive to give you quality time with your provider. You may need to reschedule your appointment if you arrive late (15 or more minutes).  Arriving late affects you and other patients whose appointments are after yours.  Also, if you miss three or more appointments without notifying the office, you may be dismissed from the clinic at the provider's discretion.      For prescription refill requests, have your pharmacy contact our office and allow 72 hours for refills to be completed.    Today you received the following chemotherapy and/or immunotherapy agents: Nivolumab       To help prevent nausea and vomiting after your treatment, we encourage you to take your nausea medication as directed.  BELOW ARE SYMPTOMS THAT SHOULD BE REPORTED IMMEDIATELY: *FEVER GREATER THAN 100.4 F (38 C) OR HIGHER *CHILLS OR SWEATING *NAUSEA AND VOMITING THAT IS NOT CONTROLLED WITH YOUR NAUSEA MEDICATION *UNUSUAL SHORTNESS OF BREATH *UNUSUAL BRUISING OR BLEEDING *URINARY PROBLEMS (pain or burning when urinating, or frequent urination) *BOWEL PROBLEMS (unusual diarrhea, constipation, pain near the anus) TENDERNESS IN MOUTH AND THROAT WITH OR WITHOUT PRESENCE OF ULCERS (sore throat, sores in mouth, or a toothache) UNUSUAL RASH, SWELLING OR PAIN  UNUSUAL VAGINAL DISCHARGE OR ITCHING   Items with * indicate a potential emergency and should be followed up as soon as possible or go to the Emergency Department if any problems should occur.  Please show the CHEMOTHERAPY ALERT CARD or IMMUNOTHERAPY  ALERT CARD at check-in to the Emergency Department and triage nurse.  Should you have questions after your visit or need to cancel or reschedule your appointment, please contact [facility] - A DEPT OF [facility]  Dept: [phone]  and follow the prompts.  Office hours are 8:00 a.m. to 4:30 p.m. Monday - Friday. Please note that voicemails left after 4:00 p.m. may not be returned until the following business day.  We are closed weekends and major holidays. You have access to a nurse at all times for urgent questions. Please call the main number to the clinic Dept: [phone] and follow the prompts.   For any non-urgent questions, you may also contact your provider using MyChart. We now offer e-Visits for anyone 21 and older to request care online for non-urgent symptoms. For details visit [email or web address].   Also download the MyChart app! Go to the app store, search MyChart, open the app, select [facility], and log in with your MyChart username and password.  Nivolumab  Injection What is this medication? NIVOLUMAB  (nye VOL ue mab) treats some types of cancer. It works by helping your immune system slow or stop the spread of cancer cells. It is a monoclonal antibody. This medicine may be used for other purposes; ask your health care provider or pharmacist if you have questions. COMMON BRAND NAME(S): Opdivo  What should I tell my care team before I take this medication? They need to know if you have any of these conditions: Allogeneic stem cell transplant (uses someone else's stem cells) Autoimmune  diseases, such as Crohn disease, ulcerative colitis, lupus History of chest radiation Nervous system problems, such as Guillain-Barre syndrome or myasthenia gravis Organ transplant An unusual or allergic reaction to nivolumab , other medications, foods, dyes, or preservatives Pregnant or trying to get pregnant Breast-feeding How should I use this  medication? This medication is infused into a vein. It is given in a hospital or clinic setting. A special MedGuide will be given to you before each treatment. Be sure to read this information carefully each time. Talk to your care team about the use of this medication in children. While it may be prescribed for children as young as 12 years for selected conditions, precautions do apply. Overdosage: If you think you have taken too much of this medicine contact a poison control center or emergency room at once. NOTE: This medicine is only for you. Do not share this medicine with others. What if I miss a dose? Keep appointments for follow-up doses. It is important not to miss your dose. Call your care team if you are unable to keep an appointment. What may interact with this medication? Interactions have not been studied. This list may not describe all possible interactions. Give your health care provider a list of all the medicines, herbs, non-prescription drugs, or dietary supplements you use. Also tell them if you smoke, drink alcohol , or use illegal drugs. Some items may interact with your medicine. What should I watch for while using this medication? Your condition will be monitored carefully while you are receiving this medication. You may need blood work while taking this medication. This medication may cause serious skin reactions. They can happen weeks to months after starting the medication. Contact your care team right away if you notice fevers or flu-like symptoms with a rash. The rash may be red or purple and then turn into blisters or peeling of the skin. You may also notice a red rash with swelling of the face, lips, or lymph nodes in your neck or under your arms. Tell your care team right away if you have any change in your eyesight. Talk to your care team if you are pregnant or think you might be pregnant. A negative pregnancy test is required before starting this medication. A reliable  form of contraception is recommended while taking this medication and for 5 months after the last dose. Talk to your care team about effective forms of contraception. Do not breast-feed while taking this medication and for 5 months after the last dose. What side effects may I notice from receiving this medication? Side effects that you should report to your care team as soon as possible: Allergic reactions--skin rash, itching, hives, swelling of the face, lips, tongue, or throat Dry cough, shortness of breath or trouble breathing Eye pain, redness, irritation, or discharge with blurry or decreased vision Heart muscle inflammation--unusual weakness or fatigue, shortness of breath, chest pain, fast or irregular heartbeat, dizziness, swelling of the ankles, feet, or hands Hormone gland problems--headache, sensitivity to light, unusual weakness or fatigue, dizziness, fast or irregular heartbeat, increased sensitivity to cold or heat, excessive sweating, constipation, hair loss, increased thirst or amount of urine, tremors or shaking, irritability Infusion reactions--chest pain, shortness of breath or trouble breathing, feeling faint or lightheaded Kidney injury (glomerulonephritis)--decrease in the amount of urine, red or dark brown urine, foamy or bubbly urine, swelling of the ankles, hands, or feet Liver injury--right upper belly pain, loss of appetite, nausea, light-colored stool, dark yellow or brown urine, yellowing skin or  eyes, unusual weakness or fatigue Pain, tingling, or numbness in the hands or feet, muscle weakness, change in vision, confusion or trouble speaking, loss of balance or coordination, trouble walking, seizures Rash, fever, and swollen lymph nodes Redness, blistering, peeling, or loosening of the skin, including inside the mouth Sudden or severe stomach pain, bloody diarrhea, fever, nausea, vomiting Side effects that usually do not require medical attention (report these to your  care team if they continue or are bothersome): Bone, joint, or muscle pain Diarrhea Fatigue Loss of appetite Nausea Skin rash This list may not describe all possible side effects. Call your doctor for medical advice about side effects. You may report side effects to FDA at 1-800-FDA-1088. Where should I keep my medication? This medication is given in a hospital or clinic. It will not be stored at home. NOTE: This sheet is a summary. It may not cover all possible information. If you have questions about this medicine, talk to your doctor, pharmacist, or health care provider.  2024 Elsevier/Gold Standard (2021-09-03 00:00:00)

## 2024-01-28 NOTE — Assessment & Plan Note (Addendum)
 Please review oncology history for additional details.  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  Concern for lymph node involvement based on CT scan.   - On 04/04/2023, Dr. Rollin performed upper EUS.  It was at least T3, N 1/2, MX by endosonographic criteria.   -Previously ordered additional testing on biopsy specimen for potential targeted therapy options including MMR testing, PD-L1 testing  -Staging PET scan on 04/07/2023 showed distal esophageal/GE junction adenocarcinoma.  Lower paraesophageal nodal metastasis.  No evidence of distant metastatic disease.  -Referral sent to cardiothoracic surgeon Dr. Shyrl and to Dr. Dewey, radiation oncologist for concurrent chemoradiation planning in the neoadjuvant setting.  -He started radiation treatments under the direction of Dr. Dewey from 04/21/2023.  He started chemotherapy with carboplatin  and paclitaxel  from 04/25/2023.  Completed last dose of chemotherapy on 05/30/2023.  -Restaging PET scan from 06/30/2023 showed excellent response with substantial interval improvement in hypermetabolic activity within the distal esophagus, indicating a positive response to therapy.  FDG activity improved from 26 to 5.7, suggesting decreased tumor activity. No residual or progressive nodal disease or signs of disease elsewhere.   On 07/28/2023, Dr. Shyrl performed Ivor Lewis esophagectomy, pyloromyotomy, laparoscopic jejunostomy placement.  Final pathology showed very focal minimal residual adenocarcinoma predominantly in the background of scar, near complete response, score 1 of 3.margins were negative.  14 lymph nodes were removed and they were negative for malignancy. ypT1b,pN0.   The patient's post operative course was long and complicated.  He developed delayed anastomotic leak and sepsis with empyema.  He required esophageal stent placement which was attempted to be removed in May 2025, however EGD revealed continued  leak requiring replacement of stent.  He was transferred to [facility] for GI evaluation.  He was able to have his stent removed on 11/27/23.  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  He ultimately required TPN and Tube feedings to maintain nutritional status.  He eventually had a jejunostomy tube removed on 12/09/2023.  Reviewed relevant so far with the patient and his wife in detail.  On 01/06/2024, restaging CT chest, abdomen and pelvis showed no evidence of disease.  Postoperative changes noted.  No evidence of disease.  Postoperative changes noted.  Signs consistent with aspiration was noted in bilateral lungs, right more than left.  Plan for adjuvant treatments with nivolumab  every 2 weeks for 8 cycles followed by every 4 weeks.  Started this from 01/28/2024.  -He was advised to continue omeprazole daily.  RTC in 2 weeks for labs, office visit and continuation of nivolumab  treatments.

## 2024-01-28 NOTE — Progress Notes (Signed)
 [facility]  ONCOLOGY CLINIC PROGRESS NOTE   Patient Name:  Seth Burch  MR # [id number]  DOB: 1955-09-27   Patient Care Team: Marelyn Axon, MD as PCP - General (Family Medicine) Misenheimer, Evalene, MD as Consulting Physician (Gastroenterology) Autumn Millman, MD as Consulting Physician (Hematology and Oncology)  Date of visit: 01/28/2024   ASSESSMENT & PLAN:   68 y.o. 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  Primary adenocarcinoma of distal third of esophagus [facility]) Please review oncology history for additional details.  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  Concern for lymph node involvement based on CT scan.   - On 04/04/2023, Dr. Rollin performed upper EUS.  It was at least T3, N 1/2, MX by endosonographic criteria.   -Previously ordered additional testing on biopsy specimen for potential targeted therapy options including MMR testing, PD-L1 testing  -Staging PET scan on 04/07/2023 showed distal esophageal/GE junction adenocarcinoma.  Lower paraesophageal nodal metastasis.  No evidence of distant metastatic disease.  -Referral sent to cardiothoracic surgeon Dr. Shyrl and to Dr. Dewey, radiation oncologist for concurrent chemoradiation planning in the neoadjuvant setting.  -He started radiation treatments under the direction of Dr. Dewey from 04/21/2023.  He started chemotherapy with carboplatin  and paclitaxel  from 04/25/2023.  Completed last dose of chemotherapy on 05/30/2023.  -Restaging PET scan from 06/30/2023 showed excellent response with substantial interval improvement in hypermetabolic activity within the distal esophagus, indicating a positive response to therapy.  FDG activity improved from 26 to 5.7, suggesting  decreased tumor activity. No residual or progressive nodal disease or signs of disease elsewhere.   On 07/28/2023, Dr. Shyrl performed Ivor Lewis esophagectomy, pyloromyotomy, laparoscopic jejunostomy placement.  Final pathology showed very focal minimal residual adenocarcinoma predominantly in the background of scar, near complete response, score 1 of 3.margins were negative.  14 lymph nodes were removed and they were negative for malignancy. ypT1b,pN0.   The patient's post operative course was long and complicated.  He developed delayed anastomotic leak and sepsis with empyema.  He required esophageal stent placement which was attempted to be removed in May 2025, however EGD revealed continued leak requiring replacement of stent.  He was transferred to [facility] for GI evaluation.  He was able to have his stent removed on 11/27/23.  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  He ultimately required TPN and Tube feedings to maintain nutritional status.  He eventually had a jejunostomy tube removed on 12/09/2023.  Reviewed relevant so far with the patient and his wife in detail.  On 01/06/2024, restaging CT chest, abdomen and pelvis showed no evidence of disease.  Postoperative changes noted.  No evidence of disease.  Postoperative changes noted.  Signs consistent with aspiration was noted in bilateral lungs, right more than left.  Plan for adjuvant treatments with nivolumab  every 2 weeks for 8 cycles followed by every 4 weeks.  Started this from 01/28/2024.  -He was advised to continue omeprazole daily.  RTC in 2 weeks for labs, office visit and continuation of nivolumab  treatments.  Aspiration with chronic cough and abnormal lung findings Chronic cough and abnormal lung findings likely due to aspiration, with changes observed more on the right side. Primary care physician noted rattling sounds  in the  lungs, and he experiences coughing and difficulty breathing. Referral to pulmonology is planned for further evaluation and management. - Refer to pulmonology for further evaluation and management  Dyspnea on exertion Experiences dyspnea on exertion, unable to walk 100 feet without needing to rest. Primary care physician is attempting to arrange home oxygen therapy for ambulation. Referral to pulmonology is expected to assist with this issue. - Refer to pulmonology for further evaluation and management  Anemia Anemia persists with fluctuating hemoglobin levels, currently at 9.5 g/dL. The condition is being monitored, and blood work is being conducted regularly.  Malnutrition with weight loss Significant weight loss from 230 lbs to 157 lbs. No longer on TPN but has a PICC line in place. Able to eat but experiences difficulty due to coughing. Protein shakes are not tolerated. A nutritionist will be involved in his care to address nutritional needs. - Consult with nutritionist Elvie in the treatment room  Short term disability issues Experiencing issues with short term disability claims through [employer]. Information regarding treatment dates is missing, causing denial of claims. Coordination with paperwork department is needed to resolve the issue. - Coordinate with paperwork department to resolve short term disability claim issues with [facility]  Anemia, improving Previously, hemoglobin levels were as low as 6.8.  Labs today showed hemoglobin of 9.5.  Will continue to monitor this.  Chronic kidney disease, stage unspecified Chronic kidney disease with creatinine levels fluctuating.  Creatinine is better at 1.3 today.    I reviewed lab results and outside records for this visit and discussed relevant results with the patient. Diagnosis, plan of care and treatment options were also discussed in detail with the patient. Opportunity provided to ask questions and answers provided to his apparent  satisfaction. Provided instructions to call our clinic with any problems, questions or concerns prior to return visit. I recommended to continue follow-up with PCP and sub-specialists. He verbalized understanding and agreed with the plan.   NCCN guidelines have been consulted in the planning of this patient's care.  I provided 40 minutes of face-to-face time during this encounter and > 50% was spent counseling as documented under my assessment and plan.    Chinita Patten, MD  01/28/2024 6:26 PM  [facility] - A DEPT OF [facility] [address] [place] [address] Dept: [phone] Dept Fax: [phone]    CHIEF COMPLAINT/ REASON FOR VISIT:   Adenocarcinoma of lower third of esophagus/GE junction, clinical stage III  Current Treatment: Neoadjuvant concurrent chemoradiation with weekly carboplatin  and paclitaxel .  Radiation treatment started from 04/21/2023.  Chemotherapy started from 04/25/2023 and received last dose on 05/30/2023.  Status post Ivor Lewis esophagectomy in March 2025.  Started adjuvant immunotherapy with nivolumab  from 01/28/2024.  INTERVAL HISTORY:    Discussed the use of AI scribe software for clinical note transcription with the patient, who gave verbal consent to proceed.  History of Present Illness  He has been experiencing a persistent cough and difficulty breathing for the past four weeks. His family physician noted rattling sounds in the right lung. The cough makes eating difficult, and he cannot walk 100 feet without needing to sit down due to shortness of breath and fatigue.  He was previously on total parenteral nutrition (TPN), which has been discontinued. He still has a PICC line in place. His weight has decreased significantly from his usual 230 pounds to 157 pounds, and he is currently around 160  pounds. He is able to eat certain foods that stay down, but protein shakes cause immediate  vomiting. He has been prescribed Zofran  and Compazine  for nausea, which he uses as needed.  He has been experiencing anemia, with fluctuating hemoglobin levels, currently at 9.5. His creatinine level has improved to 1.3.    I have reviewed the past medical history, past surgical history, social history and family history with the patient and they are unchanged from previous note.  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 Larene after he presented to his PCP with complaints of dysphagia and weight loss.  He started noticing difficulty in swallowing in January or February 2024.  He also reported unintentional weight loss of about 20 pounds in 1 year.  Previously he used to take ranitidine for intermittent acid reflux but it was stopped approximately in 2018.  He was started on omeprazole and an EGD was planned for further evaluation.  Also screening colonoscopy was recommended since last colonoscopy was in 2012.   03/11/2023 Pathology Results   Biopsy of stomach/gastric cardia: Cardiac mucosa showing at least intramucosal adenocarcinoma.  Biopsy of esophageal mass: Invasive, moderately differentiated adenocarcinoma.   03/11/2023 Procedure   EGD with biopsy by Dr.Misenheimer: Large mass in the distal esophagus between 30 and 38 cm.  Probable infiltration of the mass in the gastric cardia.  Colonoscopy on 03/11/2023: Mild diverticular disease in the left colon.   03/14/2023 Imaging   CT chest, abdomen and pelvis: Circumferential, masslike thickening in the lower third of esophagus, 7 cm in length, consistent with known primary esophageal malignancy.  Enlarged right paraesophageal lymph node measuring 1.2 x 0.8 cm, consistent with nodal metastatic disease.  No other evidence of lymphadenopathy or  metastatic disease in the chest, abdomen or pelvis.  Background of very fine centrilobular pulmonary nodules, most concentrated in the lung apices as well as mild diffuse bronchial wall thickening, most commonly seen in smoking-related respiratory bronchiolitis.  Prostatomegaly.   04/04/2023 Procedure   EUS by Dr.Hung: cT3 or cT4, N1/N2,Mx disease.   04/07/2023 PET scan   PET scan: distal esophageal/GE junction adenocarcinoma.  Lower paraesophageal nodal metastasis.  No evidence of distant metastatic disease.   04/24/2023 Cancer Staging   Staging form: Esophagus - Adenocarcinoma, AJCC 8th Edition - Clinical: Stage III (cT3, cN1, cM0, G2) - Signed by Autumn Millman, MD on 04/24/2023 Histologic grading system: 3 grade system   04/25/2023 - 05/30/2023 Chemotherapy   Patient is on Treatment Plan : ESOPHAGUS Carboplatin  + Paclitaxel  Weekly X 6 Weeks with XRT     06/30/2023 PET scan   Substantial interval improvement in hypermetabolic activity within the distal esophagus, consistent with response to therapy. No residual or progressive adjacent nodal disease demonstrated. No evidence of metastatic disease.   01/01/2024 Cancer Staging   Staging form: Esophagus - Adenocarcinoma, AJCC 8th Edition - Pathologic: Stage I (ypT1b, ypN0, cM0, G2) - Signed by Autumn Millman, MD on 01/01/2024 Stage prefix: Post-therapy Total positive nodes: 0 Histologic grading system: 3 grade system Residual tumor (R): R0   01/28/2024 -  Chemotherapy   Patient is on Treatment Plan : GASTROESOPHAGEAL Nivolumab  (240) q14d x 8 cycles / Nivolumab  (480) q28d       On 07/28/2023, Dr. Shyrl performed Ivor Lewis esophagectomy, pyloromyotomy, laparoscopic jejunostomy placement.  Final pathology showed very  focal minimal residual adenocarcinoma predominantly in the background of scar, near complete response, score 1 of 3.margins were negative.  14 lymph nodes were removed and they were negative for malignancy. ypT1b,pN0.   The  patient's post operative course was long and complicated.  He developed delayed anastomotic leak and sepsis with empyema.  He required esophageal stent placement which was attempted to be removed in May 2025, however EGD revealed continued leak requiring replacement of stent.  He was transferred to [facility] for GI evaluation.  He was able to have his stent removed on 11/27/23.  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  He ultimately required TPN and Tube feedings to maintain nutritional status.  He eventually had a jejunostomy tube removed on 12/09/2023.  On 01/06/2024, restaging CT chest, abdomen and pelvis showed no evidence of disease.  Postoperative changes noted.  No evidence of disease.  Postoperative changes noted.  Signs consistent with aspiration was noted in bilateral lungs, right more than left.  Plan for adjuvant treatments with nivolumab  every 2 weeks for 8 cycles followed by every 4 weeks.  Started this from 01/28/2024.  REVIEW OF SYSTEMS:   Review of Systems - Oncology  All other pertinent systems were reviewed with the patient and are negative.  ALLERGIES: He has no known allergies.  MEDICATIONS:  Current Outpatient Medications  Medication Sig Dispense Refill   atorvastatin  (LIPITOR) 20 MG tablet Crush and place 1 tablet (20 mg total) into feeding tube daily. 30 tablet 1   doxazosin  (CARDURA ) 2 MG tablet Crush and place 1 tablet (2 mg total) into feeding tube daily. 30 tablet 1   famotidine  (PEPCID ) 20 MG tablet Crush and place 1 tablet (20 mg total) into feeding tube daily. 30 tablet 1   folic acid  (FOLVITE ) 1 MG tablet Crush and place 1 tablet (1 mg total) into feeding tube daily. 30 tablet 1   lidocaine -prilocaine  (EMLA ) cream Apply to affected area once 30 g 3   liver oil-zinc  oxide (DESITIN) 40 % ointment Apply topically 3 (three) times daily as needed for irritation. 56.7 g 0    Multiple Vitamin (MULTIVITAMIN WITH MINERALS) TABS tablet Crush and place 1 tablet into feeding tube daily. 30 tablet 1   Nutritional Supplements (VIVONEX RTF) LIQD Take 45 mLs by mouth every hour.     omeprazole (PRILOSEC) 40 MG capsule Take 40 mg by mouth daily.     ondansetron  (ZOFRAN ) 4 MG tablet Crush and place 1 tablet (4 mg total) into feeding tube every 6 (six) hours as needed for nausea. 20 tablet 0   ondansetron  (ZOFRAN ) 8 MG tablet Take 1 tablet (8 mg total) by mouth every 8 (eight) hours as needed for nausea or vomiting. 30 tablet 1   oxyCODONE  (OXY IR/ROXICODONE ) 5 MG immediate release tablet Crush and place 1 tablet (5 mg total) into feeding tube every 6 (six) hours as needed for moderate pain (pain score 4-6). Do not take if the Tussionex (medication for cough) has been taken within the previous 6 hours. 28 tablet 0   prochlorperazine  (COMPAZINE ) 10 MG tablet Take 1 tablet (10 mg total) by mouth every 6 (six) hours as needed for nausea or vomiting. 30 tablet 1   Protein (FEEDING SUPPLEMENT, PROSOURCE TF20,) liquid Place 60 mLs into feeding tube 2 (two) times daily.     saccharomyces boulardii (FLORASTOR) 250 MG capsule Open 1 capsule (250 mg total) and put into feeding  tube 2 (two) times daily. 60 capsule 1   simethicone  (MYLICON) 40 MG/0.6ML drops Place 1.2 mLs (80 mg total) into feeding tube 4 (four) times daily as needed (for bloating, or excessive gas.). 30 mL 0   tamsulosin  (FLOMAX ) 0.4 MG CAPS capsule Take 0.4 mg by mouth daily.     thiamine  (VITAMIN B1) 100 MG tablet Crush and place 1 tablet (100 mg total) into feeding tube daily. 30 tablet 1   Water  For Irrigation, Sterile (FREE WATER ) SOLN Place 250 mLs into feeding tube in the morning, at noon, in the evening, and at bedtime.     acetaminophen  (TYLENOL ) 325 MG tablet Place 2 tablets (650 mg total) into feeding tube every 6 (six) hours as needed for mild pain (pain score 1-3) (or Fever >/= 101). (Patient not taking: Reported on  01/13/2024)     chlorpheniramine-HYDROcodone (TUSSIONEX) 10-8 MG/5ML Take 5 mLs by mouth every 12 (twelve) hours as needed for cough. (Patient not taking: Reported on 01/28/2024) 473 mL 0   insulin  glargine (LANTUS ) 100 UNIT/ML Solostar Pen Inject 25 Units into the skin daily. (Patient not taking: Reported on 01/28/2024) 9 mL 2   Insulin  Pen Needle 32G X 4 MM MISC Use with insulin  pens (Patient not taking: Reported on 01/28/2024) 200 each 0   metoprolol  tartrate (LOPRESSOR ) 25 MG tablet Crush and place 1 tablet (25 mg total) into feeding tube 2 (two) times daily. (Patient not taking: Reported on 01/13/2024) 60 tablet 1   No current facility-administered medications for this visit.     VITALS:   Blood pressure 132/83, pulse 86, temperature (!) 97.4 F (36.3 C), temperature source Temporal, resp. rate 19, height 5' 10 (1.778 m), weight 157 lb (71.2 kg), SpO2 94%.  Wt Readings from Last 3 Encounters:  01/28/24 157 lb (71.2 kg)  01/13/24 159 lb (72.1 kg)  01/13/24 159 lb (72.1 kg)    Body mass index is 22.53 kg/m.   Onc Performance Status - 01/28/24 1451       ECOG Perf Status   ECOG Perf Status Restricted in physically strenuous activity but ambulatory and able to carry out work of a light or sedentary nature, e.g., light house work, office work      KPS SCALE   KPS % SCORE Normal activity with effort, some s/s of disease            PHYSICAL EXAM:   Physical Exam Constitutional:      General: He is not in acute distress.    Appearance: Normal appearance.  HENT:     Head: Normocephalic and atraumatic.  Eyes:     General: No scleral icterus.    Conjunctiva/sclera: Conjunctivae normal.  Cardiovascular:     Rate and Rhythm: Normal rate and regular rhythm.     Heart sounds: Normal heart sounds.  Pulmonary:     Effort: Pulmonary effort is normal.     Breath sounds: Normal breath sounds.  Chest:     Comments: Right-sided Port-A-Cath in place without any signs of  infection  Multiple scars from surgery are well-healed Abdominal:     General: There is no distension.  Musculoskeletal:     Right lower leg: No edema.     Left lower leg: No edema.  Neurological:     General: No focal deficit present.     Mental Status: He is alert and oriented to person, place, and time.  Psychiatric:        Mood and Affect: Mood normal.  Behavior: Behavior normal.        Thought Content: Thought content normal.      LABORATORY DATA:   I have reviewed the data as listed.   Results for orders placed or performed in visit on 01/28/24 (from the past 72 hours)  CMP (Cancer Center only)     Status: Abnormal   Collection Time: 01/28/24  2:11 PM  Result Value Ref Range   Sodium 140 135 - 145 mmol/L   Potassium 3.5 3.5 - 5.1 mmol/L   Chloride 105 98 - 111 mmol/L   CO2 27 22 - 32 mmol/L   Glucose, Bld 142 (H) 70 - 99 mg/dL    Comment: Glucose reference range applies only to samples taken after fasting for at least 8 hours.   BUN 14 8 - 23 mg/dL   Creatinine 8.69 (H) 9.38 - 1.24 mg/dL   Calcium  8.7 (L) 8.9 - 10.3 mg/dL   Total Protein 7.1 6.5 - 8.1 g/dL   Albumin  3.2 (L) 3.5 - 5.0 g/dL   AST 18 15 - 41 U/L   ALT 13 0 - 44 U/L   Alkaline Phosphatase 208 (H) 38 - 126 U/L   Total Bilirubin 0.4 0.0 - 1.2 mg/dL   GFR, Estimated >39 >39 mL/min    Comment: (NOTE) Calculated using the CKD-EPI Creatinine Equation (2021)    Anion gap 8 5 - 15    Comment: Performed at [facility], [address] [place], [place] [address]  CBC with Differential (Cancer Center Only)     Status: Abnormal   Collection Time: 01/28/24  2:11 PM  Result Value Ref Range   WBC Count 9.4 4.0 - 10.5 K/uL   RBC 3.44 (L) 4.22 - 5.81 MIL/uL   Hemoglobin 9.5 (L) 13.0 - 17.0 g/dL   HCT 70.5 (L) 60.9 - 47.9 %   MCV 85.5 80.0 - 100.0 fL   MCH 27.6 26.0 - 34.0 pg   MCHC 32.3 30.0 - 36.0 g/dL   RDW 86.1 88.4 - 84.4 %   Platelet Count 346 150 - 400 K/uL   nRBC 0.0  0.0 - 0.2 %   Neutrophils Relative % 73 %   Neutro Abs 6.8 1.7 - 7.7 K/uL   Lymphocytes Relative 20 %   Lymphs Abs 1.9 0.7 - 4.0 K/uL   Monocytes Relative 7 %   Monocytes Absolute 0.6 0.1 - 1.0 K/uL   Eosinophils Relative 0 %   Eosinophils Absolute 0.0 0.0 - 0.5 K/uL   Basophils Relative 0 %   Basophils Absolute 0.0 0.0 - 0.1 K/uL   Immature Granulocytes 0 %   Abs Immature Granulocytes 0.04 0.00 - 0.07 K/uL    Comment: Performed at [facility], [address] [place], [place] [address]  Magnesium      Status: None   Collection Time: 01/28/24  2:11 PM  Result Value Ref Range   Magnesium  1.8 1.7 - 2.4 mg/dL    Comment: Performed at [facility], [address], [place], [place] [address]     RADIOGRAPHIC STUDIES:  CT CHEST ABDOMEN PELVIS W CONTRAST Result Date: 01/18/2024 CLINICAL DATA:  Esophageal cancer restaging, status post esophagectomy * Tracking Code: BO * EXAM: CT CHEST, ABDOMEN, AND PELVIS WITH CONTRAST TECHNIQUE: Multidetector CT imaging of the chest, abdomen and pelvis was performed following the standard protocol during bolus administration of intravenous contrast. RADIATION DOSE REDUCTION: This exam was performed according to the departmental dose-optimization program  which includes automated exposure control, adjustment of the mA and/or kV according to patient size and/or use of iterative reconstruction technique. CONTRAST:  80mL OMNIPAQUE  IOHEXOL  300 MG/ML  SOLN COMPARISON:  10/08/2023 FINDINGS: CT CHEST FINDINGS Cardiovascular: Right chest port catheter. Aortic atherosclerosis. Normal heart size. Scattered left coronary artery calcifications. No pericardial effusion. Mediastinum/Nodes: No enlarged mediastinal, hilar, or axillary lymph nodes. Pull-through esophagectomy. Thyroid gland and trachea demonstrate no significant findings. Lungs/Pleura: Small, loculated appearing pleural effusions, left-greater-than-right similar to  prior examination. Extensive clustered centrilobular and tree-in-bud nodularity as well as small consolidations primarily in the lung bases, right-greater-than-left, worsened compared to prior examination (series 7, image 96). Musculoskeletal: No chest wall abnormality. No acute osseous findings. CT ABDOMEN PELVIS FINDINGS Hepatobiliary: No solid liver abnormality is seen. No gallstones, gallbladder wall thickening, or biliary dilatation. Pancreas: Unremarkable. No pancreatic ductal dilatation or surrounding inflammatory changes. Spleen: Normal in size without significant abnormality. Adrenals/Urinary Tract: Adrenal glands are unremarkable. Kidneys are normal, without renal calculi, solid lesion, or hydronephrosis. Bladder is unremarkable. Stomach/Bowel: Pull-through esophagectomy. Appendix appears normal. No evidence of bowel wall thickening, distention, or inflammatory changes. Vascular/Lymphatic: Aortic atherosclerosis. No enlarged abdominal or pelvic lymph nodes. Sequelae lymphangiography is seen throughout the posterior mediastinum and retroperitoneum including lipiodol and cement. Reproductive: Prostatomegaly. Other: No abdominal wall hernia or abnormality. No ascites. Musculoskeletal: No acute osseous findings. IMPRESSION: 1. Status post pull-through esophagectomy. 2. No convincing evidence of recurrent or metastatic disease in the chest, abdomen, or pelvis. 3. Extensive clustered centrilobular and tree-in-bud nodularity as well as small consolidations primarily in the lung bases, right-greater-than-left, worsened compared to prior examination. Findings are consistent with atypical infection and/or aspiration. 4. Small, loculated appearing pleural effusions, left-greater-than-right similar to prior examination. 5. Sequelae of lymphangiography and lymphatic cement embolization throughout the posterior mediastinum and retroperitoneum. 6. Coronary artery disease. 7. Prostatomegaly. Aortic Atherosclerosis  (ICD10-I70.0). Electronically Signed   By: Marolyn JONETTA Jaksch M.D.   On: 01/18/2024 07:36     CODE STATUS: Full code.   Orders Placed This Encounter  Procedures   Ambulatory referral to Pulmonology    Referral Priority:   Routine    Referral Type:   Consultation    Referral Reason:   Specialty Services Required    Requested Specialty:   Pulmonary Disease    Number of Visits Requested:   1     Future Appointments  Date Time Provider Department Center  02/06/2024 11:00 AM Shyrl Linnie KIDD, MD [facility]  02/11/2024  2:15 PM [facility] None  02/11/2024  2:45 PM Carnesha Maravilla, MD [facility] None  02/11/2024  3:00 PM [facility] INFUSION [facility] None  02/11/2024  3:15 PM Ivonne Harlene RAMAN, RD [facility] None  02/25/2024  2:15 PM [facility] FLUSH [facility] None  02/25/2024  2:45 PM Virga Haltiwanger, MD [facility] None  02/25/2024  3:00 PM [facility] INFUSION [facility] None      This document was completed utilizing speech recognition software. Grammatical errors, random word insertions, pronoun errors, and incomplete sentences are an occasional consequence of this system due to software limitations, ambient noise, and hardware issues. Any formal questions or concerns about the content, text or information contained within the body of this dictation should be directly addressed to the provider for clarification.

## 2024-01-28 NOTE — Progress Notes (Unsigned)
 Nutrition Assessment  Reason for Assessment:   ASSESSMENT: Pt with primary adenocarcinoma of distal third of esophagus. S/p esophagectomy under the care of Dr. Lightfoot (5/2). He is receiving immunotherapy with opdivo .      Nutrition Focused Physical Exam:   Orbital Region: *** Buccal Region: *** Upper Arm Region: *** Thoracic and Lumbar Region: *** Temple Region: *** Clavicle Bone Region: *** Shoulder and Acromion Bone Region: *** Scapular Bone Region: *** Dorsal Hand: *** Patellar Region: *** Anterior Thigh Region: *** Posterior Calf Region: *** Edema (RD assessment): *** Hair: *** Eyes: *** Mouth: *** Skin: *** Nails: ***   Medications: ***   Labs: ***   Anthropometrics:   Height: 5'10 Weight: 157 lb  UBW: *** BMI: 22.53   Estimated Energy Needs  Kcals: *** Protein: *** Fluid: ***   NUTRITION DIAGNOSIS: ***   MALNUTRITION DIAGNOSIS: ***   INTERVENTION:  Suspect essential fatty acid deficiency given dry skin - recommend MCT oil   MONITORING, EVALUATION, GOAL: Pt will tolerate increased calories and protein to promote wt gain   Next Visit: ***

## 2024-01-29 ENCOUNTER — Telehealth: Payer: Self-pay

## 2024-01-29 LAB — T4: T4, Total: 5.5 ug/dL (ref 4.5–12.0)

## 2024-01-29 NOTE — Telephone Encounter (Signed)
 Dr. Autumn, first time Opdivo  f/u call; pt tolerated well Received: Nilsa Metro Katrinka DELENA, RN  P Onc Triage Nurse [facility] Caller: Unspecified Tanis,  4:17 PM)

## 2024-01-30 ENCOUNTER — Encounter: Payer: Self-pay | Admitting: Oncology

## 2024-02-02 ENCOUNTER — Other Ambulatory Visit: Payer: Self-pay | Admitting: Thoracic Surgery (Cardiothoracic Vascular Surgery)

## 2024-02-02 DIAGNOSIS — Z9889 Other specified postprocedural states: Secondary | ICD-10-CM

## 2024-02-06 ENCOUNTER — Ambulatory Visit
Attending: Thoracic Surgery (Cardiothoracic Vascular Surgery) | Admitting: Thoracic Surgery (Cardiothoracic Vascular Surgery)

## 2024-02-06 VITALS — BP 121/73 | HR 84 | Resp 20 | Wt 155.6 lb

## 2024-02-06 DIAGNOSIS — Z9049 Acquired absence of other specified parts of digestive tract: Secondary | ICD-10-CM

## 2024-02-06 DIAGNOSIS — Z9889 Other specified postprocedural states: Secondary | ICD-10-CM

## 2024-02-06 NOTE — Progress Notes (Signed)
     [address]       [name] [address]             [phone]       Presents for PICC line removal.  He recently started his immunotherapy, and has had worsening coughing and shortness of breath.  He also stated that his J-tube site started to leak.  On exam, it appears well healed without any drainage.  His PICC line was removed without complication.  I instructed him to keep a dressing of the J-tube site.  He would like to get a pulm referral from his PCP.    Shastina Rua MALVA Rayas

## 2024-02-11 ENCOUNTER — Inpatient Hospital Stay: Admitting: Oncology

## 2024-02-11 ENCOUNTER — Encounter: Payer: Self-pay | Admitting: Oncology

## 2024-02-11 ENCOUNTER — Encounter: Admitting: Dietician

## 2024-02-11 ENCOUNTER — Inpatient Hospital Stay

## 2024-02-11 VITALS — BP 98/65 | HR 103 | Temp 98.3°F | Resp 18

## 2024-02-11 VITALS — BP 115/66 | HR 103 | Temp 97.9°F | Resp 17 | Ht 70.0 in | Wt 152.0 lb

## 2024-02-11 DIAGNOSIS — C155 Malignant neoplasm of lower third of esophagus: Secondary | ICD-10-CM

## 2024-02-11 DIAGNOSIS — Z5112 Encounter for antineoplastic immunotherapy: Secondary | ICD-10-CM | POA: Diagnosis not present

## 2024-02-11 LAB — CBC WITH DIFFERENTIAL (CANCER CENTER ONLY)
Abs Immature Granulocytes: 0.05 K/uL (ref 0.00–0.07)
Basophils Absolute: 0.1 K/uL (ref 0.0–0.1)
Basophils Relative: 0 %
Eosinophils Absolute: 0 K/uL (ref 0.0–0.5)
Eosinophils Relative: 0 %
HCT: 29.4 % — ABNORMAL LOW (ref 39.0–52.0)
Hemoglobin: 9.1 g/dL — ABNORMAL LOW (ref 13.0–17.0)
Immature Granulocytes: 0 %
Lymphocytes Relative: 14 %
Lymphs Abs: 1.8 K/uL (ref 0.7–4.0)
MCH: 26.4 pg (ref 26.0–34.0)
MCHC: 31 g/dL (ref 30.0–36.0)
MCV: 85.2 fL (ref 80.0–100.0)
Monocytes Absolute: 1 K/uL (ref 0.1–1.0)
Monocytes Relative: 8 %
Neutro Abs: 9.4 K/uL — ABNORMAL HIGH (ref 1.7–7.7)
Neutrophils Relative %: 78 %
Platelet Count: 431 K/uL — ABNORMAL HIGH (ref 150–400)
RBC: 3.45 MIL/uL — ABNORMAL LOW (ref 4.22–5.81)
RDW: 14.6 % (ref 11.5–15.5)
WBC Count: 12.3 K/uL — ABNORMAL HIGH (ref 4.0–10.5)
nRBC: 0 % (ref 0.0–0.2)

## 2024-02-11 LAB — CMP (CANCER CENTER ONLY)
ALT: 10 U/L (ref 0–44)
AST: 15 U/L (ref 15–41)
Albumin: 3 g/dL — ABNORMAL LOW (ref 3.5–5.0)
Alkaline Phosphatase: 242 U/L — ABNORMAL HIGH (ref 38–126)
Anion gap: 6 (ref 5–15)
BUN: 17 mg/dL (ref 8–23)
CO2: 31 mmol/L (ref 22–32)
Calcium: 8.8 mg/dL — ABNORMAL LOW (ref 8.9–10.3)
Chloride: 101 mmol/L (ref 98–111)
Creatinine: 1.48 mg/dL — ABNORMAL HIGH (ref 0.61–1.24)
GFR, Estimated: 52 mL/min — ABNORMAL LOW (ref >60)
Glucose, Bld: 184 mg/dL — ABNORMAL HIGH (ref 70–99)
Potassium: 4 mmol/L (ref 3.5–5.1)
Sodium: 138 mmol/L (ref 135–145)
Total Bilirubin: 0.5 mg/dL (ref 0.0–1.2)
Total Protein: 7.3 g/dL (ref 6.5–8.1)

## 2024-02-11 MED ORDER — SODIUM CHLORIDE 0.9 % IV SOLN: INTRAVENOUS | Status: DC

## 2024-02-11 MED ORDER — SODIUM CHLORIDE 0.9% FLUSH
10.0000 mL | INTRAVENOUS | Status: DC | PRN
  Administered 2024-02-11: 10 mL

## 2024-02-11 MED ORDER — SODIUM CHLORIDE 0.9 % IV SOLN
240.0000 mg | Freq: Once | INTRAVENOUS | Status: AC
  Administered 2024-02-11: 240 mg via INTRAVENOUS
  Filled 2024-02-11: qty 24

## 2024-02-11 MED ORDER — ONDANSETRON 8 MG PO TBDP: 8.0000 mg | ORAL_TABLET | Freq: Three times a day (TID) | ORAL | 3 refills | Status: DC | PRN

## 2024-02-11 NOTE — Patient Instructions (Signed)
 [facility] - A DEPT OF [facility]  Discharge Instructions: Thank you for choosing [facility] to provide your oncology and hematology care.   If you have a lab appointment with the Cancer Center, please go directly to the Cancer Center and check in at the registration area.   Wear comfortable clothing and clothing appropriate for easy access to any Portacath or PICC line.   We strive to give you quality time with your provider. You may need to reschedule your appointment if you arrive late (15 or more minutes).  Arriving late affects you and other patients whose appointments are after yours.  Also, if you miss three or more appointments without notifying the office, you may be dismissed from the clinic at the provider's discretion.      For prescription refill requests, have your pharmacy contact our office and allow 72 hours for refills to be completed.    Today you received the following chemotherapy and/or immunotherapy agents: Nivolumab       To help prevent nausea and vomiting after your treatment, we encourage you to take your nausea medication as directed.  BELOW ARE SYMPTOMS THAT SHOULD BE REPORTED IMMEDIATELY: *FEVER GREATER THAN 100.4 F (38 C) OR HIGHER *CHILLS OR SWEATING *NAUSEA AND VOMITING THAT IS NOT CONTROLLED WITH YOUR NAUSEA MEDICATION *UNUSUAL SHORTNESS OF BREATH *UNUSUAL BRUISING OR BLEEDING *URINARY PROBLEMS (pain or burning when urinating, or frequent urination) *BOWEL PROBLEMS (unusual diarrhea, constipation, pain near the anus) TENDERNESS IN MOUTH AND THROAT WITH OR WITHOUT PRESENCE OF ULCERS (sore throat, sores in mouth, or a toothache) UNUSUAL RASH, SWELLING OR PAIN  UNUSUAL VAGINAL DISCHARGE OR ITCHING   Items with * indicate a potential emergency and should be followed up as soon as possible or go to the Emergency Department if any problems should occur.  Please show the CHEMOTHERAPY ALERT CARD or IMMUNOTHERAPY  ALERT CARD at check-in to the Emergency Department and triage nurse.  Should you have questions after your visit or need to cancel or reschedule your appointment, please contact [facility] - A DEPT OF [facility]  Dept: [phone]  and follow the prompts.  Office hours are 8:00 a.m. to 4:30 p.m. Monday - Friday. Please note that voicemails left after 4:00 p.m. may not be returned until the following business day.  We are closed weekends and major holidays. You have access to a nurse at all times for urgent questions. Please call the main number to the clinic Dept: [phone] and follow the prompts.   For any non-urgent questions, you may also contact your provider using MyChart. We now offer e-Visits for anyone 21 and older to request care online for non-urgent symptoms. For details visit [email or web address].   Also download the MyChart app! Go to the app store, search MyChart, open the app, select [facility], and log in with your MyChart username and password.  Nivolumab  Injection What is this medication? NIVOLUMAB  (nye VOL ue mab) treats some types of cancer. It works by helping your immune system slow or stop the spread of cancer cells. It is a monoclonal antibody. This medicine may be used for other purposes; ask your health care provider or pharmacist if you have questions. COMMON BRAND NAME(S): Opdivo  What should I tell my care team before I take this medication? They need to know if you have any of these conditions: Allogeneic stem cell transplant (uses someone else's stem cells) Autoimmune  diseases, such as Crohn disease, ulcerative colitis, lupus History of chest radiation Nervous system problems, such as Guillain-Barre syndrome or myasthenia gravis Organ transplant An unusual or allergic reaction to nivolumab , other medications, foods, dyes, or preservatives Pregnant or trying to get pregnant Breast-feeding How should I use this  medication? This medication is infused into a vein. It is given in a hospital or clinic setting. A special MedGuide will be given to you before each treatment. Be sure to read this information carefully each time. Talk to your care team about the use of this medication in children. While it may be prescribed for children as young as 12 years for selected conditions, precautions do apply. Overdosage: If you think you have taken too much of this medicine contact a poison control center or emergency room at once. NOTE: This medicine is only for you. Do not share this medicine with others. What if I miss a dose? Keep appointments for follow-up doses. It is important not to miss your dose. Call your care team if you are unable to keep an appointment. What may interact with this medication? Interactions have not been studied. This list may not describe all possible interactions. Give your health care provider a list of all the medicines, herbs, non-prescription drugs, or dietary supplements you use. Also tell them if you smoke, drink alcohol , or use illegal drugs. Some items may interact with your medicine. What should I watch for while using this medication? Your condition will be monitored carefully while you are receiving this medication. You may need blood work while taking this medication. This medication may cause serious skin reactions. They can happen weeks to months after starting the medication. Contact your care team right away if you notice fevers or flu-like symptoms with a rash. The rash may be red or purple and then turn into blisters or peeling of the skin. You may also notice a red rash with swelling of the face, lips, or lymph nodes in your neck or under your arms. Tell your care team right away if you have any change in your eyesight. Talk to your care team if you are pregnant or think you might be pregnant. A negative pregnancy test is required before starting this medication. A reliable  form of contraception is recommended while taking this medication and for 5 months after the last dose. Talk to your care team about effective forms of contraception. Do not breast-feed while taking this medication and for 5 months after the last dose. What side effects may I notice from receiving this medication? Side effects that you should report to your care team as soon as possible: Allergic reactions--skin rash, itching, hives, swelling of the face, lips, tongue, or throat Dry cough, shortness of breath or trouble breathing Eye pain, redness, irritation, or discharge with blurry or decreased vision Heart muscle inflammation--unusual weakness or fatigue, shortness of breath, chest pain, fast or irregular heartbeat, dizziness, swelling of the ankles, feet, or hands Hormone gland problems--headache, sensitivity to light, unusual weakness or fatigue, dizziness, fast or irregular heartbeat, increased sensitivity to cold or heat, excessive sweating, constipation, hair loss, increased thirst or amount of urine, tremors or shaking, irritability Infusion reactions--chest pain, shortness of breath or trouble breathing, feeling faint or lightheaded Kidney injury (glomerulonephritis)--decrease in the amount of urine, red or dark brown urine, foamy or bubbly urine, swelling of the ankles, hands, or feet Liver injury--right upper belly pain, loss of appetite, nausea, light-colored stool, dark yellow or brown urine, yellowing skin or  eyes, unusual weakness or fatigue Pain, tingling, or numbness in the hands or feet, muscle weakness, change in vision, confusion or trouble speaking, loss of balance or coordination, trouble walking, seizures Rash, fever, and swollen lymph nodes Redness, blistering, peeling, or loosening of the skin, including inside the mouth Sudden or severe stomach pain, bloody diarrhea, fever, nausea, vomiting Side effects that usually do not require medical attention (report these to your  care team if they continue or are bothersome): Bone, joint, or muscle pain Diarrhea Fatigue Loss of appetite Nausea Skin rash This list may not describe all possible side effects. Call your doctor for medical advice about side effects. You may report side effects to FDA at 1-800-FDA-1088. Where should I keep my medication? This medication is given in a hospital or clinic. It will not be stored at home. NOTE: This sheet is a summary. It may not cover all possible information. If you have questions about this medicine, talk to your doctor, pharmacist, or health care provider.  2024 Elsevier/Gold Standard (2021-09-03 00:00:00)

## 2024-02-11 NOTE — Assessment & Plan Note (Signed)
 Please review oncology history for additional details.  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  Concern for lymph node involvement based on CT scan.   - On 04/04/2023, Dr. Rollin performed upper EUS.  It was at least T3, N 1/2, MX by endosonographic criteria.   -Previously ordered additional testing on biopsy specimen for potential targeted therapy options including MMR testing, PD-L1 testing  -Staging PET scan on 04/07/2023 showed distal esophageal/GE junction adenocarcinoma.  Lower paraesophageal nodal metastasis.  No evidence of distant metastatic disease.  -Referral sent to cardiothoracic surgeon Dr. Shyrl and to Dr. Dewey, radiation oncologist for concurrent chemoradiation planning in the neoadjuvant setting.  -He started radiation treatments under the direction of Dr. Dewey from 04/21/2023.  He started chemotherapy with carboplatin  and paclitaxel  from 04/25/2023.  Completed last dose of chemotherapy on 05/30/2023.  -Restaging PET scan from 06/30/2023 showed excellent response with substantial interval improvement in hypermetabolic activity within the distal esophagus, indicating a positive response to therapy.  FDG activity improved from 26 to 5.7, suggesting decreased tumor activity. No residual or progressive nodal disease or signs of disease elsewhere.   On 07/28/2023, Dr. Shyrl performed Ivor Lewis esophagectomy, pyloromyotomy, laparoscopic jejunostomy placement.  Final pathology showed very focal minimal residual adenocarcinoma predominantly in the background of scar, near complete response, score 1 of 3.margins were negative.  14 lymph nodes were removed and they were negative for malignancy. ypT1b,pN0.   The patient's post operative course was long and complicated.  He developed delayed anastomotic leak and sepsis with empyema.  He required esophageal stent placement which was attempted to be removed in May 2025, however EGD revealed continued  leak requiring replacement of stent.  He was transferred to [facility] for GI evaluation.  He was able to have his stent removed on 11/27/23.  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  He ultimately required TPN and Tube feedings to maintain nutritional status.  He eventually had a jejunostomy tube removed on 12/09/2023.  Reviewed relevant so far with the patient and his wife in detail.  On 01/06/2024, restaging CT chest, abdomen and pelvis showed no evidence of disease.  Postoperative changes noted.  No evidence of disease.  Postoperative changes noted.  Signs consistent with aspiration was noted in bilateral lungs, right more than left.  Plan for adjuvant treatments with nivolumab  every 2 weeks for 8 cycles followed by every 4 weeks.  Started this from 01/28/2024.  Labs today reveal no dose-limiting toxicities.  Patient was given the option to take a break from treatment this week but patient would like to continue with the treatments.  Proceed with nivolumab  dose today.  -He was advised to continue omeprazole daily.  RTC in 2 weeks for labs, office visit and continuation of nivolumab  treatments.

## 2024-02-11 NOTE — Progress Notes (Signed)
 Dr. Autumn aware that patient had been running a temperature previous day- CXR ordered- patient/wife aware they need to go have it done.

## 2024-02-11 NOTE — Progress Notes (Signed)
 [facility]  ONCOLOGY CLINIC PROGRESS NOTE   Patient Name:  Seth Burch  MR # [id number]  DOB: [birth year]   Patient Care Team: Marelyn Axon, MD as PCP - General (Family Medicine) Misenheimer, Evalene, MD as Consulting Physician (Gastroenterology) Autumn Millman, MD as Consulting Physician (Hematology and Oncology)  Date of visit: 02/11/2024   ASSESSMENT & PLAN:   68 y.o. 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  Primary adenocarcinoma of distal third of esophagus [facility]) Please review oncology history for additional details.  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  Concern for lymph node involvement based on CT scan.   - On 04/04/2023, Dr. Rollin performed upper EUS.  It was at least T3, N 1/2, MX by endosonographic criteria.   -Previously ordered additional testing on biopsy specimen for potential targeted therapy options including MMR testing, PD-L1 testing  -Staging PET scan on 04/07/2023 showed distal esophageal/GE junction adenocarcinoma.  Lower paraesophageal nodal metastasis.  No evidence of distant metastatic disease.  -Referral sent to cardiothoracic surgeon Dr. Shyrl and to Dr. Dewey, radiation oncologist for concurrent chemoradiation planning in the neoadjuvant setting.  -He started radiation treatments under the direction of Dr. Dewey from 04/21/2023.  He started chemotherapy with carboplatin  and paclitaxel  from 04/25/2023.  Completed last dose of chemotherapy on 05/30/2023.  -Restaging PET scan from 06/30/2023 showed excellent response with substantial interval improvement in hypermetabolic activity within the distal esophagus, indicating a positive response to therapy.  FDG activity improved from 26 to 5.7, suggesting  decreased tumor activity. No residual or progressive nodal disease or signs of disease elsewhere.   On 07/28/2023, Dr. Shyrl performed Ivor Lewis esophagectomy, pyloromyotomy, laparoscopic jejunostomy placement.  Final pathology showed very focal minimal residual adenocarcinoma predominantly in the background of scar, near complete response, score 1 of 3.margins were negative.  14 lymph nodes were removed and they were negative for malignancy. ypT1b,pN0.   The patient's post operative course was long and complicated.  He developed delayed anastomotic leak and sepsis with empyema.  He required esophageal stent placement which was attempted to be removed in May 2025, however EGD revealed continued leak requiring replacement of stent.  He was transferred to [facility] for GI evaluation.  He was able to have his stent removed on 11/27/23.  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  He ultimately required TPN and Tube feedings to maintain nutritional status.  He eventually had a jejunostomy tube removed on 12/09/2023.  Reviewed relevant so far with the patient and his wife in detail.  On 01/06/2024, restaging CT chest, abdomen and pelvis showed no evidence of disease.  Postoperative changes noted.  No evidence of disease.  Postoperative changes noted.  Signs consistent with aspiration was noted in bilateral lungs, right more than left.  Plan for adjuvant treatments with nivolumab  every 2 weeks for 8 cycles followed by every 4 weeks.  Started this from 01/28/2024.  Labs today reveal no dose-limiting toxicities.  Patient was given the option to take a break from treatment this week but patient would like to continue with the treatments.  Proceed with nivolumab  dose today.  -He was advised to continue omeprazole daily.  RTC in 2 weeks for labs, office visit and continuation  of nivolumab  treatments.  Aspiration  with chronic cough and abnormal lung findings Chronic cough and abnormal lung findings likely due to aspiration, with changes observed more on the right side. Primary care physician noted rattling sounds in the lungs, and he experiences coughing and difficulty breathing. Referral to pulmonology is planned for further evaluation and management. - Referred to pulmonology for further evaluation and management - Will also obtain chest x-ray as requested by Dr. Shyrl previously  Dyspnea on exertion Experiences dyspnea on exertion, unable to walk 100 feet without needing to rest. Primary care physician is attempting to arrange home oxygen therapy for ambulation. Referral to pulmonology is expected to assist with this issue. - Referred to pulmonology for further evaluation and management  Anemia Anemia persists with fluctuating hemoglobin levels, currently at 9.1 g/dL. The condition is being monitored, and blood work is being conducted regularly.  Malnutrition with weight loss Significant weight loss from 230 lbs to 157 lbs. No longer on TPN but has a PICC line in place. Able to eat but experiences difficulty due to coughing. Protein shakes are not tolerated. A nutritionist will be involved in his care to address nutritional needs. - Consult with nutritionist Elvie in the treatment room  Chronic kidney disease, stage unspecified Chronic kidney disease with creatinine levels fluctuating.  Creatinine is stable at 1.48 today.    I reviewed lab results and outside records for this visit and discussed relevant results with the patient. Diagnosis, plan of care and treatment options were also discussed in detail with the patient. Opportunity provided to ask questions and answers provided to his apparent satisfaction. Provided instructions to call our clinic with any problems, questions or concerns prior to return visit. I recommended to continue follow-up with PCP and sub-specialists. He verbalized  understanding and agreed with the plan.   NCCN guidelines have been consulted in the planning of this patient's care.  I provided 30 minutes of face-to-face time during this encounter and > 50% was spent counseling as documented under my assessment and plan.    Chinita Patten, MD  02/11/2024 5:32 PM   [facility] - A DEPT OF [facility] [address] [place] [address] Dept: [phone] Dept Fax: [phone]    CHIEF COMPLAINT/ REASON FOR VISIT:   Adenocarcinoma of lower third of esophagus/GE junction, clinical stage III  Current Treatment: Neoadjuvant concurrent chemoradiation with weekly carboplatin  and paclitaxel .  Radiation treatment started from 04/21/2023.  Chemotherapy started from 04/25/2023 and received last dose on 05/30/2023.  Status post Ivor Lewis esophagectomy in March 2025.  Started adjuvant immunotherapy with nivolumab  from 01/28/2024.  INTERVAL HISTORY:    Discussed the use of AI scribe software for clinical note transcription with the patient, who gave verbal consent to proceed.  History of Present Illness  He has experienced a fever since the Saturday following his treatment, with temperatures reaching 101.58F. Tylenol  was effective in reducing the fever after about an hour, but he continued to feel unwell and did not eat until late in the day.  Significant nausea has been present, making it difficult for him to eat. He describes that when he does eat, the food 'comes back on me'. Zofran  and Compazine  were administered for nausea, which, along with Tylenol , improved his condition after about an hour.  He continues to have a persistent and severe cough, which he describes as being as bad as it has been since his hospital discharge. This symptom  has been ongoing and was present before his current treatment regimen.  No issues with swallowing or choking.    I have reviewed the past medical  history, past surgical history, social history and family history with the patient and they are unchanged from previous note.  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 Larene after he presented to his PCP with complaints of dysphagia and weight loss.  He started noticing difficulty in swallowing in January or February 2024.  He also reported unintentional weight loss of about 20 pounds in 1 year.  Previously he used to take ranitidine for intermittent acid reflux but it was stopped approximately in 2018.  He was started on omeprazole and an EGD was planned for further evaluation.  Also screening colonoscopy was recommended since last colonoscopy was in 2012.   03/11/2023 Pathology Results   Biopsy of stomach/gastric cardia: Cardiac mucosa showing at least intramucosal adenocarcinoma.  Biopsy of esophageal mass: Invasive, moderately differentiated adenocarcinoma.   03/11/2023 Procedure   EGD with biopsy by Dr.Misenheimer: Large mass in the distal esophagus between 30 and 38 cm.  Probable infiltration of the mass in the gastric cardia.  Colonoscopy on 03/11/2023: Mild diverticular disease in the left colon.   03/14/2023 Imaging   CT chest, abdomen and pelvis: Circumferential, masslike thickening in the lower third of esophagus, 7 cm in length, consistent with known primary esophageal malignancy.  Enlarged right paraesophageal lymph node measuring 1.2 x 0.8 cm, consistent with nodal metastatic disease.  No other evidence of lymphadenopathy or metastatic disease in the chest, abdomen or pelvis.  Background of very fine centrilobular pulmonary nodules, most concentrated in the lung apices as well as mild diffuse bronchial wall thickening, most commonly seen in smoking-related respiratory  bronchiolitis.  Prostatomegaly.   04/04/2023 Procedure   EUS by Dr.Hung: cT3 or cT4, N1/N2,Mx disease.   04/07/2023 PET scan   PET scan: distal esophageal/GE junction adenocarcinoma.  Lower paraesophageal nodal metastasis.  No evidence of distant metastatic disease.   04/24/2023 Cancer Staging   Staging form: Esophagus - Adenocarcinoma, AJCC 8th Edition - Clinical: Stage III (cT3, cN1, cM0, G2) - Signed by Autumn Millman, MD on 04/24/2023 Histologic grading system: 3 grade system   04/25/2023 - 05/30/2023 Chemotherapy   Patient is on Treatment Plan : ESOPHAGUS Carboplatin  + Paclitaxel  Weekly X 6 Weeks with XRT     06/30/2023 PET scan   Substantial interval improvement in hypermetabolic activity within the distal esophagus, consistent with response to therapy. No residual or progressive adjacent nodal disease demonstrated. No evidence of metastatic disease.   01/01/2024 Cancer Staging   Staging form: Esophagus - Adenocarcinoma, AJCC 8th Edition - Pathologic: Stage I (ypT1b, ypN0, cM0, G2) - Signed by Autumn Millman, MD on 01/01/2024 Stage prefix: Post-therapy Total positive nodes: 0 Histologic grading system: 3 grade system Residual tumor (R): R0   01/28/2024 -  Chemotherapy   Patient is on Treatment Plan : GASTROESOPHAGEAL Nivolumab  (240) q14d x 8 cycles / Nivolumab  (480) q28d       On 07/28/2023, Dr. Shyrl performed Ivor Lewis esophagectomy, pyloromyotomy, laparoscopic jejunostomy placement.  Final pathology showed very focal minimal residual adenocarcinoma predominantly in the background of scar, near complete response, score 1 of 3.margins were negative.  14 lymph nodes were removed and they were negative for malignancy. ypT1b,pN0.  The patient's post operative course was long and complicated.  He developed delayed anastomotic leak and sepsis with empyema.  He required esophageal stent placement which was attempted to be removed in May 2025, however EGD revealed continued leak  requiring replacement of stent.  He was transferred to [facility] for GI evaluation.  He was able to have his stent removed on 11/27/23.  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  He ultimately required TPN and Tube feedings to maintain nutritional status.  He eventually had a jejunostomy tube removed on 12/09/2023.  On 01/06/2024, restaging CT chest, abdomen and pelvis showed no evidence of disease.  Postoperative changes noted.  No evidence of disease.  Postoperative changes noted.  Signs consistent with aspiration was noted in bilateral lungs, right more than left.  Plan for adjuvant treatments with nivolumab  every 2 weeks for 8 cycles followed by every 4 weeks.  Started this from 01/28/2024.  REVIEW OF SYSTEMS:   Review of Systems - Oncology  All other pertinent systems were reviewed with the patient and are negative.  ALLERGIES: He has no known allergies.  MEDICATIONS:  Current Outpatient Medications  Medication Sig Dispense Refill   acetaminophen  (TYLENOL ) 325 MG tablet Place 2 tablets (650 mg total) into feeding tube every 6 (six) hours as needed for mild pain (pain score 1-3) (or Fever >/= 101).     atorvastatin  (LIPITOR) 20 MG tablet Crush and place 1 tablet (20 mg total) into feeding tube daily. 30 tablet 1   chlorpheniramine-HYDROcodone (TUSSIONEX) 10-8 MG/5ML Take 5 mLs by mouth every 12 (twelve) hours as needed for cough. 473 mL 0   doxazosin  (CARDURA ) 2 MG tablet Crush and place 1 tablet (2 mg total) into feeding tube daily. 30 tablet 1   famotidine  (PEPCID ) 20 MG tablet Crush and place 1 tablet (20 mg total) into feeding tube daily. 30 tablet 1   folic acid  (FOLVITE ) 1 MG tablet Crush and place 1 tablet (1 mg total) into feeding tube daily. 30 tablet 1   lidocaine -prilocaine  (EMLA ) cream Apply to affected area once 30 g 3   liver oil-zinc  oxide (DESITIN) 40 % ointment Apply topically 3  (three) times daily as needed for irritation. 56.7 g 0   Multiple Vitamin (MULTIVITAMIN WITH MINERALS) TABS tablet Crush and place 1 tablet into feeding tube daily. 30 tablet 1   Nutritional Supplements (VIVONEX RTF) LIQD Take 45 mLs by mouth every hour.     omeprazole (PRILOSEC) 40 MG capsule Take 40 mg by mouth daily.     ondansetron  (ZOFRAN -ODT) 8 MG disintegrating tablet Take 1 tablet (8 mg total) by mouth every 8 (eight) hours as needed for nausea or vomiting. 30 tablet 3   oxyCODONE  (OXY IR/ROXICODONE ) 5 MG immediate release tablet Crush and place 1 tablet (5 mg total) into feeding tube every 6 (six) hours as needed for moderate pain (pain score 4-6). Do not take if the Tussionex (medication for cough) has been taken within the previous 6 hours. 28 tablet 0   prochlorperazine  (COMPAZINE ) 10 MG tablet Take 1 tablet (10 mg total) by mouth every 6 (six) hours as needed for nausea or vomiting. 30 tablet 1   Protein (FEEDING SUPPLEMENT, PROSOURCE TF20,) liquid Place 60 mLs into feeding tube 2 (two) times daily.     saccharomyces boulardii (FLORASTOR) 250 MG capsule Open 1 capsule (250 mg total) and put into feeding tube 2 (two) times daily. 60  capsule 1   simethicone  (MYLICON) 40 MG/0.6ML drops Place 1.2 mLs (80 mg total) into feeding tube 4 (four) times daily as needed (for bloating, or excessive gas.). (Patient not taking: Reported on 02/11/2024) 30 mL 0   tamsulosin  (FLOMAX ) 0.4 MG CAPS capsule Take 0.4 mg by mouth daily.     thiamine  (VITAMIN B1) 100 MG tablet Crush and place 1 tablet (100 mg total) into feeding tube daily. 30 tablet 1   Water  For Irrigation, Sterile (FREE WATER ) SOLN Place 250 mLs into feeding tube in the morning, at noon, in the evening, and at bedtime.     insulin  glargine (LANTUS ) 100 UNIT/ML Solostar Pen Inject 25 Units into the skin daily. (Patient not taking: Reported on 02/11/2024) 9 mL 2   Insulin  Pen Needle 32G X 4 MM MISC Use with insulin  pens (Patient not taking:  Reported on 02/11/2024) 200 each 0   metoprolol  tartrate (LOPRESSOR ) 25 MG tablet Crush and place 1 tablet (25 mg total) into feeding tube 2 (two) times daily. (Patient not taking: Reported on 02/11/2024) 60 tablet 1   No current facility-administered medications for this visit.   Facility-Administered Medications Ordered in Other Visits  Medication Dose Route Frequency Provider Last Rate Last Admin   0.9 %  sodium chloride  infusion   Intravenous Continuous Armas Mcbee, MD   Stopped at 02/11/24 1710   sodium chloride  flush (NS) 0.9 % injection 10 mL  10 mL Intracatheter PRN Dorien Bessent, MD   10 mL at 02/11/24 1711     VITALS:   Blood pressure 115/66, pulse (!) 103, temperature 97.9 F (36.6 C), temperature source Temporal, resp. rate 17, height 5' 10 (1.778 m), weight 152 lb (68.9 kg), SpO2 93%.  Wt Readings from Last 3 Encounters:  02/11/24 152 lb (68.9 kg)  02/06/24 155 lb 9.6 oz (70.6 kg)  01/28/24 157 lb (71.2 kg)    Body mass index is 21.81 kg/m.   Onc Performance Status - 02/11/24 1515       ECOG Perf Status   ECOG Perf Status Restricted in physically strenuous activity but ambulatory and able to carry out work of a light or sedentary nature, e.g., light house work, office work      KPS SCALE   KPS % SCORE Normal activity with effort, some s/s of disease            PHYSICAL EXAM:   Physical Exam Constitutional:      General: He is not in acute distress.    Appearance: Normal appearance.  HENT:     Head: Normocephalic and atraumatic.  Eyes:     General: No scleral icterus.    Conjunctiva/sclera: Conjunctivae normal.  Cardiovascular:     Rate and Rhythm: Normal rate and regular rhythm.     Heart sounds: Normal heart sounds.  Pulmonary:     Effort: Pulmonary effort is normal.     Breath sounds: Normal breath sounds.  Chest:     Comments: Right-sided Port-A-Cath in place without any signs of infection  Multiple scars from surgery are  well-healed Abdominal:     General: There is no distension.  Musculoskeletal:     Right lower leg: No edema.     Left lower leg: No edema.  Neurological:     General: No focal deficit present.     Mental Status: He is alert and oriented to person, place, and time.  Psychiatric:        Mood and Affect: Mood normal.  Behavior: Behavior normal.        Thought Content: Thought content normal.      LABORATORY DATA:   I have reviewed the data as listed.   Results for orders placed or performed in visit on 02/11/24 (from the past 72 hours)  CMP (Cancer Center only)     Status: Abnormal   Collection Time: 02/11/24  2:29 PM  Result Value Ref Range   Sodium 138 135 - 145 mmol/L   Potassium 4.0 3.5 - 5.1 mmol/L   Chloride 101 98 - 111 mmol/L   CO2 31 22 - 32 mmol/L   Glucose, Bld 184 (H) 70 - 99 mg/dL    Comment: Glucose reference range applies only to samples taken after fasting for at least 8 hours.   BUN 17 8 - 23 mg/dL   Creatinine 8.51 (H) 9.38 - 1.24 mg/dL   Calcium  8.8 (L) 8.9 - 10.3 mg/dL   Total Protein 7.3 6.5 - 8.1 g/dL   Albumin  3.0 (L) 3.5 - 5.0 g/dL   AST 15 15 - 41 U/L   ALT 10 0 - 44 U/L   Alkaline Phosphatase 242 (H) 38 - 126 U/L   Total Bilirubin 0.5 0.0 - 1.2 mg/dL   GFR, Estimated 52 (L) >60 mL/min    Comment: (NOTE) Calculated using the CKD-EPI Creatinine Equation (2021)    Anion gap 6 5 - 15    Comment: Performed at [facility], [address], [place], [place] [address]  CBC with Differential (Cancer Center Only)     Status: Abnormal   Collection Time: 02/11/24  2:29 PM  Result Value Ref Range   WBC Count 12.3 (H) 4.0 - 10.5 K/uL   RBC 3.45 (L) 4.22 - 5.81 MIL/uL   Hemoglobin 9.1 (L) 13.0 - 17.0 g/dL   HCT 70.5 (L) 60.9 - 47.9 %   MCV 85.2 80.0 - 100.0 fL   MCH 26.4 26.0 - 34.0 pg   MCHC 31.0 30.0 - 36.0 g/dL   RDW 85.3 88.4 - 84.4 %   Platelet Count 431 (H) 150 - 400 K/uL   nRBC 0.0 0.0 - 0.2 %   Neutrophils  Relative % 78 %   Neutro Abs 9.4 (H) 1.7 - 7.7 K/uL   Lymphocytes Relative 14 %   Lymphs Abs 1.8 0.7 - 4.0 K/uL   Monocytes Relative 8 %   Monocytes Absolute 1.0 0.1 - 1.0 K/uL   Eosinophils Relative 0 %   Eosinophils Absolute 0.0 0.0 - 0.5 K/uL   Basophils Relative 0 %   Basophils Absolute 0.1 0.0 - 0.1 K/uL   Immature Granulocytes 0 %   Abs Immature Granulocytes 0.05 0.00 - 0.07 K/uL    Comment: Performed at [facility], [address] [place], [place] [address]     RADIOGRAPHIC STUDIES:  No results found.    CODE STATUS: Full code.   No orders of the defined types were placed in this encounter.    Future Appointments  Date Time Provider Department Center  02/25/2024  2:15 PM [facility] FLUSH [facility] None  02/25/2024  2:45 PM Eliannah Hinde, MD [facility] None  02/25/2024  3:00 PM [facility] INFUSION [facility] None  02/25/2024  3:15 PM Ivonne Harlene RAMAN, RD [facility] None      This document was completed utilizing speech recognition software. Grammatical errors, random word insertions, pronoun errors, and incomplete sentences are an occasional consequence of this system due to software limitations, ambient  noise, and hardware issues. Any formal questions or concerns about the content, text or information contained within the body of this dictation should be directly addressed to the provider for clarification.

## 2024-02-12 ENCOUNTER — Other Ambulatory Visit: Payer: Self-pay | Admitting: Oncology

## 2024-02-12 DIAGNOSIS — C155 Malignant neoplasm of lower third of esophagus: Secondary | ICD-10-CM

## 2024-02-24 ENCOUNTER — Encounter (HOSPITAL_COMMUNITY): Payer: Self-pay

## 2024-02-24 ENCOUNTER — Telehealth: Payer: Self-pay

## 2024-02-24 NOTE — Telephone Encounter (Signed)
 Returned patient's wife's call. Melissa reported that her husband was currently at [facility] in the ICU for Pneumonia. All Oncology appointment for tomorrow cancelled.

## 2024-02-25 ENCOUNTER — Inpatient Hospital Stay

## 2024-02-25 ENCOUNTER — Inpatient Hospital Stay: Admitting: Dietician

## 2024-02-25 ENCOUNTER — Inpatient Hospital Stay: Admitting: Oncology

## 2024-03-08 NOTE — Progress Notes (Signed)
 General Medicine Progress Note  Hospital Day: 9 with principal problem of Aspiration pneumonia of both lower lobes (CMS/HHS-HCC)    Subjective:   - NAEO. Patient is still regurgitating on 43ml/hr. Discussed with CTS yesterday and they noted that if TF are not being tolerated, TPN would be the next step. Had been on TPN for many months outpatient. CT abdomen with potential aspirated contents in the R pleural space, otherwise no ileus or SBO. Remains on 2L  - Nutrition assisting with TPN, PICC ordered  Objective:     Current Vital Signs 24h Vital Sign Ranges  T 36.3 C (97.4 F) (03/08/24 0521) Temp  Avg: 36.6 C (97.8 F)  Min: 36.3 C (97.4 F)  Max: 36.8 C (98.2 F)  BP (!) 140/82 (03/07/24 2351) BP  Min: 140/82  Max: 185/74  HR 73 (03/08/24 0521) Pulse  Avg: 72.3  Min: 70  Max: 75  RR 20 (03/08/24 0521) Resp  Avg: 20  Min: 20  Max: 20  O2sat 95 % Nasal cannula (03/07/24 2351) SpO2  Avg: 95.5 %  Min: 95 %  Max: 96 %       Current Shift Ins/Outs 24h Total Ins/Outs  Time Ins Outs No intake/output data recorded. 10/19 0701 - 10/20 0700 In: 2571.2 [I.V.:1924.2] Out: 1175 [Urine:1175]       Weights   First 70.3 kg (155 lb) (02/29/24 1728)   Last 70.3 kg (155 lb) (03/02/24 1312)    Gen: alert, sitting up in bed, no acute distress HEENT: EOMI, MMM Neck: soft, NT  CV: normal rate, regular rhythm, normal S1/S2, no m/r/g Resp: lungs CTAB, no wheezes or crackles, comfortable with [place] partially out of nose  Abd: soft, mild epigastric tenderness. ND, +BS. J tube c/d/i Skin: no rash Extr: no peripheral edema Neuro: no gross focal deficits, moving all extremities, face symmetric   Scheduled Medications   acetaminophen , 650 mg, Via Tube, Q6H SCH enoxaparin , 40 mg, Subcutaneous, Daily multivitamin, 15 mL, Via Tube, Daily thiamine , 100 mg, Via Tube, Daily     Infusions   lactated Ringers , Last Rate: 50 mL/hr at 03/08/24 0014       PRN Meds   dextrose , 50 mL/hr,  Intravenous, As Directed dextrose  50% in water , 12.5-25 g, Intravenous, As Directed glucagon, 1 mg, Subcutaneous, As Directed lidocaine , 0.5 mL, Subcutaneous, As Directed morphine , 1 mg, Intravenous, Q4H PRN ondansetron , 4 mg, Intravenous, Q8H PRN oxyCODONE , 5 mg, Oral, Q6H PRN        Selected Labs and Data: Recent Labs  Lab 03/06/24 0613 03/07/24 0547 03/08/24 0508  NA 140 138 139  K 3.8 3.5 3.6  CL 106 102 101  CO2 27 29 31*  BUN 3* 2* 4*  CREATININE 1.0 0.9 1.0  GLUCOSE 147* 101 101  CALCIUM  7.9* 7.6* 7.9*   Recent Labs  Lab 03/02/24 0554  AST 23  ALT 12*  ALKPHOS 197*  TBILI 0.6     Recent Labs  Lab 03/04/24 0332 03/06/24 0613 03/08/24 0508  WBC 7.3 10.8* 6.9  HGB 7.6* 7.8* 7.3*  HCT 25.6* 26.3* 25.1*  PLT 345 420 447   Recent Labs  Lab 03/02/24 0554  INR 1.1      Microbiology: Sputum culture 10/6 at [facility] (redemonstrated on CF culture 10/7):  E coli: pansensitive Enterococcus: susceptible to vanc   Pertinent Imaging:  Barium swallow 10/15: Findings/Impression: After administration of contrast, majority of contrast passes through the stent and into the gastric pull-through. However, there is some contrast  that is seen external to the stent (i.e. series 3) with subsequent leakage of contrast into the right pleural space at the level of the inferior aspect of the stent. The site of extravasation/leak corresponds to the defect seen within the right posterior aspect of the gastric conduit on the prior CT.   Contrast is also seen within bilateral main stem bronchi, presumably owing to a component of aspiration.   After this initial swallow, patient was unable to tolerate further administration of contrast.    Assessment and Plan   Seth Burch is a 68 y.o. male who was admitted for the following problems: Principal Problem:   Aspiration pneumonia of both lower lobes (CMS/HHS-HCC) Active Problems:   Anemia of chronic disease    Esophageal anastomotic leak   Esophageal cancer (CMS/HHS-HCC)   Sepsis (CMS/HHS-HCC)  Aspiration pneumonia Tracheoesophageal fistula Chronic hypoxemic respiratory failure Procedure at [facility] March 2025 was c/b tracheoesophageal fistula and patient now presenting with cough and regurgitation, with persistent leak/concern for TEF with barium contents in RLL on modified barium swallow done at OSH on 10/7. He was started on cefepime  and vancomycin  at OSH on 10/6 but transitioned to Zosyn  on October 7 after sputum cultures grew E. coli and Enterococcus. - Advanced GI/biliary consulted, appreciate assistance. S/p esophageal stenting on 10/14 with persistent leak on subsequent esophagram - CT surgery discussed possible EndoVac vs repeating imaging after a month of JT, family opting for conservative management with repeat imaging in a month  - OSH CT scan was concerning for possible fluid collection adjacent to the esophagus, not reported on overread. This was discussed with Radiology and no concerns for infection. Vanc/zosyn  discontinued 10/16 - continue supplemental O2 as tolerated  Leukocytosis  Regurgitation  Patient started tube feeds on 10/17 and shortly thereafter starting to experience regurgitation of gastric contents. No evidence of bowel obstruction clinically though could have ileus. Given his persistent leak despite TEF stent, he is at risk for aspiration. Currently stable on 2L and has no new respiratory complaints other than the regurgitation episodes. No fevers. CXR obtained with bilateral opacities. He is still regurgitating despite being on only 41ml/hr of tube feeds.  - Paged CT surgery to discuss if his regurgitation- recommending TPN - If he were to fever or have any increase in O2 requirement today, STOP tube feeds, start Vanc/Zosyn   - CT abdomen 10/19 without SBO or ileus, J tube in appropriate place   Hypophosphatemia Hypokalemia, improving Hypomagnesemia,  improving Hypoalbuminemia Severe protein-calorie malnutrition Malnutrition in the context of chronic illness Criteria: >7.5% unintentional weight loss in 3 months, energy intake meeting < 75% of estimated requirement for > or equal to 1 month, moderate subcutaneous fat loss, moderate muscle mass loss. Patient previously on TPN at home, had been trying PO/NG but now with persistent leak / aspiration pneumonia.  - Having to transition to TPN given that he is not tolerating enteral nutrition - anticipate outpatient TPN for at least several weeks before repeat imaging and CT surgery follow up appointment. Appreciate nutrition and CM assistance.  - J tube placed by IR on 10/16, started feeds 10/17 - tube feeds: Nutren 1.5 at 10/hr - may just try to do trickle feeds indefinitely, discussing with nutrition  - IV thiamine  200 mg x 3, followed by VT thiamine  100 mg daily - MVI now will be included in TPN  - monitor for refeeding: K, phos, Mg until feeds are at goal - daily while on TPN  - trend weights -  Nutrition consulted, greatly appreciate help  - post JT pain: scheduled tylenol , oxycodone  liquid q6h prn. If having acute pain after tube feeds, notify IR  Normocytic Anemia Hgb low at 7.8 on admission, dropped further . Recent baseline Hgb since 10/2023 in Care Everywhere ~9-10. Reportedly required 1 unit pRBC at OSH, no evidence of bleeding. B12 wnl, ferritin 351 and %transferrin sat 351. Possibly 2/2 sepsis or anemia of inflammation, may also have component of blood loss anemia from frequent lab draws since he has been hospitalized for 1 week.  - follow CBC q48h - transfusion goal Hgb > 7 - monitor for bleeding  BPH Concern for urinary retention Patient reports pain and straining with urination over the past couple of days while off his flomax . He reports a history of urinary retention in the past while hospitalized. PVRs persistently ~200s despite attempting to urinate more and with a lot of  difficulty, so Foley placed 10/13. - keep Foley for now   - Start Doxazosin  2mg  tonight, will attempt TOV after 1-2 doses   Esophageal cancer s/p esophagectomy with reconstruction and gastric anastomosis and pull-through Patient follows with Oncology at [facility] long, per chart review started on immunotherapy in September. - outpatient f/u   Diet-controlled type 2 diabetes A1C 7. - monitor POC glucose q6h  # VTE Prophylaxis: low molecular weight heparin   # Code Status: Full Code   Discharge Planning: Expected Discharge Date: 03/09/2024  Current Activity: Walks occasionally (03/07/24 2021) Current Mobility: No limitation (03/07/24 2021) Anticipated Discharge Destination: Home Anticipated Discharge Needs: TBD PT DC recommendation: Home;Home Health PT (03/05/24 1506) Follow-up appointments scheduled:   No future appointments.  Attestation Statement:   I personally performed the service. (TP) Reviewed the following unique tests: BMP, phos, Mag, CBC and Discussed management of TPN with nutrition, CM and Treating the patient for recurrent aspiration that poses a threat to life or bodily functions. This increases the complexity of data to be reviewed and analyzed and the risk of complications/mortality/morbidity of patient management   GRACE DOROTHYANN HEART, MD 03/08/2024

## 2024-03-10 ENCOUNTER — Inpatient Hospital Stay: Admitting: Oncology

## 2024-03-10 ENCOUNTER — Telehealth: Payer: Self-pay

## 2024-03-10 ENCOUNTER — Inpatient Hospital Stay

## 2024-03-10 NOTE — Progress Notes (Signed)
 General Medicine Progress Note  Hospital Day: 11 with principal problem of Aspiration pneumonia of both lower lobes (CMS/HHS-HCC)    Subjective:   NAEO, tolerating TPN, should reach goal today. Discharge may not be for a few days, appreciate CM and nutrition. Working to get OP prescriber.   Objective:     Current Vital Signs 24h Vital Sign Ranges  T 36.4 C (97.5 F) (03/09/24 2127) Temp  Avg: 36.5 C (97.7 F)  Min: 36.4 C (97.5 F)  Max: 36.6 C (97.8 F)  BP (!) 152/83 (03/10/24 0028) BP  Min: 139/87  Max: 158/88  HR 81 (03/10/24 0028) Pulse  Avg: 81.8  Min: 74  Max: 86  RR 18 (03/10/24 0028) Resp  Avg: 18.8  Min: 18  Max: 20  O2sat 95 % Nasal cannula (03/10/24 0028) SpO2  Avg: 93 %  Min: 88 %  Max: 97 %       Current Shift Ins/Outs 24h Total Ins/Outs  Time Ins Outs No intake/output data recorded. 10/21 0701 - 10/22 0700 In: 3231.7 [I.V.:774.2] Out: 1775 [Urine:1775]       Weights   First 70.3 kg (155 lb) (02/29/24 1728)   Last 70.4 kg (155 lb 3.2 oz) (03/09/24 1428)    Gen: alert, sitting up in bed, no acute distress HEENT: EOMI, MMM Neck: soft, NT  CV: normal rate, regular rhythm, normal S1/S2, no m/r/g Resp: lungs CTAB, no wheezes or crackles, comfortable with [place] partially out of nose  Abd: soft, mild epigastric tenderness (stable). ND, +BS. J tube c/d/i Skin: no rash GU: foley, clear yellow urine  Extr: no peripheral edema Neuro: no gross focal deficits, moving all extremities, face symmetric   Scheduled Medications   acetaminophen , 650 mg, Via Tube, Q6H SCH doxazosin , 2 mg, Via Tube, QHS enoxaparin , 40 mg, Subcutaneous, Daily insulin  REGULAR (HumuLIN R , NovoLIN R) injection, 0-12 Units, Subcutaneous, Q6H SCH     Infusions   Adult TPN - DUH, Last Rate: 75 mL/hr at 03/10/24 0012 dextrose        PRN Meds   dextrose , , Intravenous, Continuous PRN dextrose  50% in water , 12.5-25 g, Intravenous, As Directed glucagon, 1 mg, Subcutaneous, As  Directed lidocaine , 0.5 mL, Subcutaneous, As Directed lidocaine , 3 mL, Subcutaneous, As Directed morphine , 1 mg, Intravenous, Q4H PRN ondansetron , 4 mg, Intravenous, Q8H PRN oxyCODONE , 5 mg, Oral, Q6H PRN        Selected Labs and Data: Recent Labs  Lab 03/08/24 0508 03/09/24 0619 03/10/24 0527  NA 139 138 140  K 3.6 3.9 4.5  CL 101 101 103  CO2 31* 29 27  BUN 4* 6* 13  CREATININE 1.0 1.0 1.0  GLUCOSE 101 167* 146*  CALCIUM  7.9* 8.0* 8.0*   No results for input(s): AST, ALT, ALKPHOS, TBILI in the last 168 hours.    Recent Labs  Lab 03/08/24 0508 03/09/24 0619 03/10/24 0527  WBC 6.9 7.4 7.4  HGB 7.3* 7.6* 7.5*  HCT 25.1* 25.0* 25.1*  PLT 447 426 418   No results for input(s): APTT, INR in the last 168 hours.     Microbiology: Sputum culture 10/6 at [facility] (redemonstrated on CF culture 10/7):  E coli: pansensitive Enterococcus: susceptible to vanc   Pertinent Imaging:  Barium swallow 10/15: Findings/Impression: After administration of contrast, majority of contrast passes through the stent and into the gastric pull-through. However, there is some contrast that is seen external to the stent (i.e. series 3) with subsequent leakage of contrast into the right pleural space  at the level of the inferior aspect of the stent. The site of extravasation/leak corresponds to the defect seen within the right posterior aspect of the gastric conduit on the prior CT.   Contrast is also seen within bilateral main stem bronchi, presumably owing to a component of aspiration.   After this initial swallow, patient was unable to tolerate further administration of contrast.    Assessment and Plan   Seth Burch is a 68 y.o. male who was admitted for the following problems: Principal Problem:   Aspiration pneumonia of both lower lobes (CMS/HHS-HCC) Active Problems:   Anemia of chronic disease   Esophageal anastomotic leak   Esophageal cancer (CMS/HHS-HCC)    Sepsis (CMS/HHS-HCC)  Aspiration pneumonia Tracheoesophageal fistula Chronic hypoxemic respiratory failure Procedure at [facility] March 2025 was c/b tracheoesophageal fistula and patient now presenting with cough and regurgitation, with persistent leak/concern for TEF with barium contents in RLL on modified barium swallow done at OSH on 10/7. He was started on cefepime  and vancomycin  at OSH on 10/6 but transitioned to Zosyn  on October 7 after sputum cultures grew E. coli and Enterococcus. - Advanced GI/biliary consulted, appreciate assistance.  - S/p esophageal stenting on 10/14 with persistent leak on subsequent esophagram - CT surgery consulted for leak  -  discussed possible EndoVac vs repeating imaging after a month of JT -> family opting for conservative management with repeat imaging in a month  - OSH CT scan was concerning for possible fluid collection adjacent to the esophagus, not reported on overread. This was discussed with Radiology and no concerns for infection. Vanc/zosyn  discontinued 10/16 - continue supplemental O2 as tolerated  Leukocytosis, resolved  Regurgitation  Patient started tube feeds on 10/17 and shortly thereafter starting to experience regurgitation of gastric contents. No evidence of bowel obstruction clinically though could have ileus. Given his persistent leak despite TEF stent, he is at risk for aspiration. Currently stable on 2L and has no new respiratory complaints other than the regurgitation episodes. No fevers. CXR obtained with bilateral opacities. He is still regurgitating despite being on only 50ml/hr of tube feeds.  - Paged CT surgery to discuss if his regurgitation- recommending TPN - If he were to fever or have any increase in O2 requirement today, STOP tube feeds, start Vanc/Zosyn   - CT abdomen 10/19 without SBO or ileus, J tube in appropriate place   Hypophosphatemia Hypokalemia, improving Hypomagnesemia,  improving Hypoalbuminemia Severe protein-calorie malnutrition Malnutrition in the context of chronic illness Criteria: >7.5% unintentional weight loss in 3 months, energy intake meeting < 75% of estimated requirement for > or equal to 1 month, moderate subcutaneous fat loss, moderate muscle mass loss. Patient previously on TPN at home, had been trying PO/NG but now with persistent leak / aspiration pneumonia.  - Having to transition to TPN given that he is not tolerating enteral nutrition - anticipate outpatient TPN for at least several weeks before repeat imaging and CT surgery follow up appointment.  - TPN to goal today, need to determine outpatient provider  - Appreciate nutrition and CM assistance.  - J tube placed by IR on 10/16, holding on tube feeds for now but ok for meds, flush for patency  - IV thiamine  200 mg x 3, followed by VT thiamine  100 mg daily - MVI now will be included in TPN  - monitor for refeeding: K, phos, Mg until feeds are at goal - daily while on TPN  - trend weights - post JT pain: scheduled tylenol , oxycodone   liquid q6h prn. If having acute pain after tube feeds, notify IR  Normocytic Anemia Hgb low at 7.8 on admission, dropped further . Recent baseline Hgb since 10/2023 in Care Everywhere ~9-10. Reportedly required 1 unit pRBC at OSH, no evidence of bleeding. B12 wnl, ferritin 351 and %transferrin sat 351. Possibly 2/2 sepsis or anemia of inflammation, may also have component of blood loss anemia from frequent lab draws since he has been hospitalized for 1 week.  - follow CBC q48h - transfusion goal Hgb > 7 - monitor for bleeding  BPH Concern for urinary retention Patient reports pain and straining with urination over the past couple of days while off his flomax . He reports a history of urinary retention in the past while hospitalized. PVRs persistently ~200s despite attempting to urinate more and with a lot of difficulty, so Foley placed 10/13. - keep Foley for now    - Start Doxazosin  2mg  tonight, will attempt TOV after 1-2 doses   Esophageal cancer s/p esophagectomy with reconstruction and gastric anastomosis and pull-through Patient follows with Oncology at [facility], per chart review started on immunotherapy in September. - outpatient f/u   Diet-controlled type 2 diabetes A1C 7. - monitor POC glucose q6h  # VTE Prophylaxis: low molecular weight heparin   # Code Status: Full Code   Discharge Planning: Expected Discharge Date: 03/11/2024  Current Activity: Walks occasionally (03/09/24 2130) Current Mobility: No limitation (03/09/24 2130) Anticipated Discharge Destination: Home Anticipated Discharge Needs: TBD PT DC recommendation: Home;Home Health PT (03/09/24 1150) Follow-up appointments scheduled:   No future appointments. Attestation Statement:   I personally performed the service. (TP) Reviewed the following unique tests: RFP, CBC, Mg and Discussed management of TPN with nutrition and Treating the patient for aspiration risk/PNA, enteral nutrition intolerance that poses a threat to life or bodily functions. This increases the complexity of data to be reviewed and analyzed and the risk of complications/mortality/morbidity of patient management   GRACE DOROTHYANN HEART, MD

## 2024-03-10 NOTE — Telephone Encounter (Signed)
 Received a voicemail from patient's wife stating that is appointments with Oncology need to be cancelled for now as he is hospitalized at [facility] for now. Dr. Autumn aware.

## 2024-03-12 ENCOUNTER — Ambulatory Visit

## 2024-03-13 NOTE — Progress Notes (Addendum)
 General Medicine Progress Note  Hospital Day: 14 with principal problem of Aspiration pneumonia of both lower lobes (CMS/HHS-HCC)    Subjective:   - Assumed care of pt this morning, chart reviewed - NAEO - This morning he is doing well. He's eager to leave the hospital. Reports that his cough is more productive of thick sputum with a slight change in color. Reports breathing can be rougher, more labored.   Objective:     Current Vital Signs 24h Vital Sign Ranges  T 36.3 C (97.3 F) (03/12/24 2305) Temp  Avg: 36.5 C (97.7 F)  Min: 36.3 C (97.3 F)  Max: 36.6 C (97.9 F)  BP (!) 149/83 (03/12/24 2305) BP  Min: 145/79  Max: 149/83  HR 91 (03/12/24 2305) Pulse  Avg: 91  Min: 91  Max: 91  RR 20 (03/12/24 2305) Resp  Avg: 19.7  Min: 19  Max: 20  O2sat 97 % Nasal cannula (03/13/24 0545) SpO2  Avg: 95.3 %  Min: 94 %  Max: 97 %       Current Shift Ins/Outs 24h Total Ins/Outs  Time Ins Outs No intake/output data recorded. 10/24 0701 - 10/25 0700 In: 684  Out: 1350 [Urine:1350]       Weights   First 70.3 kg (155 lb) (02/29/24 1728)   Last 66.4 kg (146 lb 6.4 oz) (03/13/24 0340)    Gen: alert, sitting up in bed, no acute distress HEENT: EOMI, MMM Neck: soft, NT  CV: normal rate, regular rhythm, normal S1/S2, no m/r/g Resp: lungs CTAB, no wheezes or crackles, comfortable with [place] partially out of nose  Abd: soft, mild epigastric tenderness (stable). ND, +BS. J tube c/d/i Skin: no rash GU: foley, clear yellow urine  Extr: no peripheral edema Neuro: no gross focal deficits, moving all extremities, face symmetric   Scheduled Medications   acetaminophen , 650 mg, Via Tube, Q6H SCH doxazosin , 2 mg, Via Tube, QHS enoxaparin , 40 mg, Subcutaneous, Daily insulin  REGULAR (HumuLIN R , NovoLIN R) injection, 0-12 Units, Subcutaneous, Q6H SCH     Infusions   Adult TPN - [facility], Last Rate: 120 mL/hr at 03/13/24 0058 dextrose        PRN Meds   dextrose , , Intravenous, Continuous  PRN dextrose  50% in water , 12.5-25 g, Intravenous, As Directed glucagon, 1 mg, Subcutaneous, As Directed guaiFENesin , 200 mg, Via Tube, Q4H PRN lidocaine , 0.5 mL, Subcutaneous, As Directed lidocaine , 3 mL, Subcutaneous, As Directed loperamide, 2 mg, Oral, TID PRN ondansetron , 4 mg, Intravenous, Q8H PRN oxyCODONE , 5 mg, Via Tube, Q6H PRN        Selected Labs and Data: Recent Labs  Lab 03/11/24 0611 03/12/24 0519 03/13/24 0536  NA 139 137 139  K 4.3 4.3 4.1  CL 103 104 106  CO2 27 25 23   BUN 17 21* 26*  CREATININE 1.0 0.9 0.9  GLUCOSE 150* 202* 183*  CALCIUM  8.2* 8.1* 8.4*   No results for input(s): AST, ALT, ALKPHOS, TBILI in the last 168 hours.    Recent Labs  Lab 03/11/24 0611 03/12/24 0519 03/13/24 0536  WBC 7.1 7.1 7.8  HGB 7.4* 7.4* 7.7*  HCT 24.6* 24.7* 25.1*  PLT 421 348 352   No results for input(s): APTT, INR in the last 168 hours.     Microbiology: Sputum culture 10/6 at [facility] (redemonstrated on CF culture 10/7):  E coli: pansensitive Enterococcus: susceptible to vanc   Pertinent Imaging:  Barium swallow 10/15: Findings/Impression: After administration of contrast, majority of contrast passes through the  stent and into the gastric pull-through. However, there is some contrast that is seen external to the stent (i.e. series 3) with subsequent leakage of contrast into the right pleural space at the level of the inferior aspect of the stent. The site of extravasation/leak corresponds to the defect seen within the right posterior aspect of the gastric conduit on the prior CT.   Contrast is also seen within bilateral main stem bronchi, presumably owing to a component of aspiration.   After this initial swallow, patient was unable to tolerate further administration of contrast.    Assessment and Plan   Seth Burch is a 68 y.o. male who was admitted for the following problems: Principal Problem:   Aspiration pneumonia of both  lower lobes (CMS/HHS-HCC) Active Problems:   Anemia of chronic disease   Esophageal anastomotic leak   Esophageal cancer (CMS/HHS-HCC)   Sepsis (CMS/HHS-HCC)  68 yo M w/hx esophageal cancer on immunotherapy, s/p esophagectomy with reconstruction and gastric pull through in 07/2023 c/b esophageal leak requiring stent, prolonged J-feeding (no G due to difficult anatomy) with home supplemental TPN, eating by mouth for a couple of months before he began to cough with eating, regurgitation, originally admitted to OSH [facility]) for aspiration pneumonia and found to have a leak again on modified barium swallow. Transferred to [facility] on 10/12 for advanced GI services, s/p stent placement on 10/14 though hospital course complicated by persistent leak now strict NPO and started on TPN.   Tracheoesophageal fistula Chronic hypoxemic respiratory failure (baseline 2L [place])  Aspiration pneumonia, TREATED Procedure at [facility] March 2025 was c/b tracheoesophageal fistula and patient now presenting with cough and regurgitation, with persistent leak/concern for TEF with barium contents in RLL on modified barium swallow done at OSH on 10/7. He was started on cefepime  and vancomycin  at OSH on 10/6 but transitioned to Zosyn  on October 7 after sputum cultures grew E. coli and Enterococcus. OSH CT scan was concerning for possible fluid collection adjacent to the esophagus, not reported on overread - this was discussed with radiology and no concerns for infection.  - continue 2L [place] supplemental O2 as tolerated - Advanced GI/biliary consulted, appreciate assistance.  - S/p esophageal stenting on 10/14 with persistent leak on subsequent esophagram - Completed course of abx, Vanc/zosyn  discontinued 10/16 - CT surgery consulted for leak: discussed possible EndoVac vs repeating imaging after a month of JT -> family opting for conservative management with repeat imaging in a month  [ ]  page CTS Monday for f/u  appt   Productive cough, acute on chronic  Leukocytosis, resolved  Regurgitation  Patient started tube feeds on 10/17 and shortly thereafter starting to experience regurgitation of gastric contents. No evidence of bowel obstruction clinically though could have ileus. Given his persistent leak despite TEF stent, he is at risk for aspiration. Currently stable on 2L  and has no new respiratory complaints other than the regurgitation episodes. No fevers. CXR obtained with bilateral opacities. He is still regurgitating despite being on only 70ml/hr of tube feeds so CTS now recommended for strict NPO/TPN.  - TPN initiated as below  - CT abdomen 10/19 without SBO or ileus, J tube in appropriate place  - Reports worsening sputum production 10/25 with more labored breathing with cough, but normal WBC count, afebrile, no worsening O2 requirement so will hold off on restarting abx for now   Hypophosphatemia Hypokalemia, improving Hypomagnesemia, improving Hypoalbuminemia Severe protein-calorie malnutrition Malnutrition in the context of chronic illness Criteria: >7.5% unintentional  weight loss in 3 months, energy intake meeting < 75% of estimated requirement for > or equal to 1 month, moderate subcutaneous fat loss, moderate muscle mass loss. Patient previously on TPN at home, had been trying PO/NG but now with persistent leak / regurgitation with enteral feeds so transitioned to TPN on 10/20. Anticipate outpatient TPN for at least several weeks before repeat imaging and CT surgery follow up appointment.  - Appreciate nutrition and CM assistance - J tube placed by IR on 10/16, holding on tube feeds for now but ok for meds, flush for patency  - TPN initiated 10/20, now cycled  [ ]  need to determine outpatient provider / agency that can provide this, to be discussed with CM after weekend  - S/p IV thiamine  200 mg x 3, followed by VT thiamine  100 mg daily - MVI now will be included in TPN  - monitor for  refeeding: K, phos, Mg until feeds are at goal - daily while on TPN  - trend weights - post JT pain: scheduled tylenol , oxycodone  liquid q6h prn.  [ ]  Patient working to space this, knows he will get limited supply on discharge   Normocytic Anemia Hgb low at 7.8 on admission, dropped further . Recent baseline Hgb since 10/2023 in Care Everywhere ~9-10. Reportedly required 1 unit pRBC at OSH, no evidence of bleeding. B12 wnl, ferritin 351 and %transferrin sat 351. Possibly 2/2 sepsis or anemia of inflammation, may also have component of blood loss anemia from frequent lab draws since he has been hospitalized for 1 week.  - follow CBC q48h - transfusion goal Hgb > 7 - monitor for bleeding  BPH Concern for urinary retention Patient reports pain and straining with urination over the past couple of days while off his flomax . He reports a history of urinary retention in the past while hospitalized. PVRs persistently ~200s despite attempting to urinate more and with a lot of difficulty, so Foley placed 10/13. - keep Foley for now   - Started Doxazosin , passed TOV.  [ ]  will need RX for doxazosin  on dc   Esophageal cancer s/p esophagectomy with reconstruction and gastric anastomosis and pull-through Patient follows with Oncology at [facility] long, per chart review started on immunotherapy in September. - outpatient f/u   Diet-controlled type 2 diabetes A1C 7. - monitor POC glucose q6h  # VTE Prophylaxis: low molecular weight heparin   # Code Status: Full Code   Discharge Planning: Expected Discharge Date: 03/16/2024  Current Activity: Walks occasionally (03/12/24 2100) Current Mobility: No limitation (03/12/24 2100) Anticipated Discharge Destination: Home Anticipated Discharge Needs: TBD PT DC recommendation: Home;Home Health PT (03/12/24 0944) Follow-up appointments scheduled:   No future appointments.   Attestation Statement:   I personally performed the service. (TP) I spent a total  of 55 minutes in both face-to-face and non-face-to-face activities, excluding procedures performed, for this visit on the date of this encounter.   Mercer Cline, MD [facility] Medicine [facility]

## 2024-03-14 NOTE — Progress Notes (Addendum)
 General Medicine Progress Note  Hospital Day: 15 with principal problem of Aspiration pneumonia of both lower lobes (CMS/HHS-HCC)    Subjective:   - NAEO, on room air - Slightly more tachycardic and BUN trending up, discussing with RD to add fluids to TPN. He also feels like his mouth is dry and thirsty - Otherwise had a rough morning, had a big coughing spell and then threw up. Afterwards feeling okay. Currently right now feels like his breathing is better than yesterday. Remains afebrile, no leukocytosis.   Objective:     Current Vital Signs 24h Vital Sign Ranges  T 36.6 C (97.8 F) (03/14/24 0737) Temp  Avg: 36.6 C (97.9 F)  Min: 36.6 C (97.8 F)  Max: 36.7 C (98 F)  BP (!) 147/79 (03/14/24 0737) BP  Min: 141/80  Max: 152/86  HR 106 (03/14/24 0737) Pulse  Avg: 98  Min: 90  Max: 106  RR 18 (03/14/24 0737) Resp  Avg: 18.3  Min: 18  Max: 19  O2sat 94 % Nasal cannula (03/14/24 0535) SpO2  Avg: 94.6 %  Min: 94 %  Max: 95 %       Current Shift Ins/Outs 24h Total Ins/Outs  Time Ins Outs No intake/output data recorded. 10/25 0701 - 10/26 0700 In: 1741  Out: 1530 [Urine:1530]       Weights   First 70.3 kg (155 lb) (02/29/24 1728)   Last 66.3 kg (146 lb 1.6 oz) (03/14/24 0535)    Gen: alert, sitting up in bed, no acute distress HEENT: EOMI, MMM Neck: soft, NT  CV: normal rate, regular rhythm, normal S1/S2, no m/r/g Resp: lungs CTAB, no wheezes or crackles, comfortable with [place] partially out of nose  Abd: soft, mild epigastric tenderness (stable). ND, +BS. J tube c/d/i Skin: no rash GU: foley, clear yellow urine  Extr: no peripheral edema Neuro: no gross focal deficits, moving all extremities, face symmetric   Scheduled Medications   acetaminophen , 650 mg, Via Tube, Q6H SCH doxazosin , 2 mg, Via Tube, QHS enoxaparin , 40 mg, Subcutaneous, Daily insulin  REGULAR (HumuLIN R , NovoLIN R) injection, 0-12 Units, Subcutaneous, Q6H SCH sodium chloride , 2 spray, Both Nares,  QID     Infusions   Adult TPN - [facility], Last Rate: 82 mL/hr at 03/14/24 9347 dextrose        PRN Meds   dextrose , , Intravenous, Continuous PRN dextrose  50% in water , 12.5-25 g, Intravenous, As Directed glucagon, 1 mg, Subcutaneous, As Directed guaiFENesin , 200 mg, Via Tube, Q4H PRN lidocaine , 0.5 mL, Subcutaneous, As Directed lidocaine , 3 mL, Subcutaneous, As Directed loperamide, 2 mg, Oral, TID PRN ondansetron , 4 mg, Intravenous, Q8H PRN oxyCODONE , 5 mg, Via Tube, Q6H PRN        Selected Labs and Data: Recent Labs  Lab 03/12/24 0519 03/13/24 0536 03/14/24 0533  NA 137 139 141  K 4.3 4.1 3.7  CL 104 106 111*  CO2 25 23 20*  BUN 21* 26* 33*  CREATININE 0.9 0.9 0.9  GLUCOSE 202* 183* 197*  CALCIUM  8.1* 8.4* 8.5*   No results for input(s): AST, ALT, ALKPHOS, TBILI in the last 168 hours.    Recent Labs  Lab 03/12/24 0519 03/13/24 0536 03/14/24 0533  WBC 7.1 7.8 8.7  HGB 7.4* 7.7* 7.6*  HCT 24.7* 25.1* 25.4*  PLT 348 352 335   No results for input(s): APTT, INR in the last 168 hours.     Microbiology: Sputum culture 10/6 at [facility] (redemonstrated on CF culture 10/7):  E coli:  pansensitive Enterococcus: susceptible to vanc   Pertinent Imaging:  Barium swallow 10/15: Findings/Impression: After administration of contrast, majority of contrast passes through the stent and into the gastric pull-through. However, there is some contrast that is seen external to the stent (i.e. series 3) with subsequent leakage of contrast into the right pleural space at the level of the inferior aspect of the stent. The site of extravasation/leak corresponds to the defect seen within the right posterior aspect of the gastric conduit on the prior CT.   Contrast is also seen within bilateral main stem bronchi, presumably owing to a component of aspiration.   After this initial swallow, patient was unable to tolerate further administration of contrast.     Assessment and Plan   Seth Burch is a 68 y.o. male who was admitted for the following problems: Principal Problem:   Aspiration pneumonia of both lower lobes (CMS/HHS-HCC) Active Problems:   Anemia of chronic disease   Esophageal anastomotic leak   Esophageal cancer (CMS/HHS-HCC)   Sepsis (CMS/HHS-HCC)  69 yo M w/hx esophageal cancer on immunotherapy, s/p esophagectomy with reconstruction and gastric pull through in 07/2023 c/b esophageal leak requiring stent, prolonged J-feeding (no G due to difficult anatomy) with home supplemental TPN, eating by mouth for a couple of months before he began to cough with eating, regurgitation, originally admitted to OSH [facility]) for aspiration pneumonia and found to have a leak again on modified barium swallow. Transferred to [facility] on 10/12 for advanced GI services, s/p stent placement on 10/14 though hospital course complicated by persistent leak now strict NPO and started on TPN.   Tracheoesophageal fistula Chronic hypoxemic respiratory failure (baseline 2L [place])  Aspiration pneumonia, TREATED Procedure at [facility] March 2025 was c/b tracheoesophageal fistula and patient now presenting with cough and regurgitation, with persistent leak/concern for TEF with barium contents in RLL on modified barium swallow done at OSH on 10/7. He was started on cefepime  and vancomycin  at OSH on 10/6 but transitioned to Zosyn  on October 7 after sputum cultures grew E. coli and Enterococcus. OSH CT scan was concerning for possible fluid collection adjacent to the esophagus, not reported on overread - this was discussed with radiology and no concerns for infection.  - O2: continue 2L [place] supplemental O2 as tolerated - Abx: Completed course Vanc/zosyn , discontinued 10/16 - Advanced GI/biliary consulted, appreciate assistance.  - S/p esophageal stenting on 10/14 with persistent leak on subsequent esophagram - CT surgery consulted for leak: discussed possible  EndoVac vs repeating imaging after a month of JT -> family opting for conservative management with repeat imaging in a month  [ ]  page CTS Monday for f/u appt   Productive cough, acute on chronic  Leukocytosis, resolved  Regurgitation  Patient started tube feeds on 10/17 and shortly thereafter starting to experience regurgitation of gastric contents. No evidence of bowel obstruction clinically though could have ileus. Given his persistent leak despite TEF stent, he is at risk for aspiration. Currently stable on 2L [place] and has no new respiratory complaints other than the regurgitation episodes. No fevers. CXR obtained with bilateral opacities. He is still regurgitating despite being on only 35ml/hr of tube feeds so CTS now recommended for strict NPO/TPN.  - TPN initiated as below  - CT abdomen 10/19 without SBO or ileus, J tube in appropriate place  - Reports worsening sputum production 10/25 with more labored breathing with cough, but normal WBC count, afebrile, no worsening O2 requirement and stable CXR so will hold  off on restarting abx for now  Hypophosphatemia Hypokalemia, improving Hypomagnesemia, improving Hypoalbuminemia Severe protein-calorie malnutrition Malnutrition in the context of chronic illness Criteria: >7.5% unintentional weight loss in 3 months, energy intake meeting < 75% of estimated requirement for > or equal to 1 month, moderate subcutaneous fat loss, moderate muscle mass loss. Patient previously on TPN at home, had been trying PO/NG but now with persistent leak / regurgitation with enteral feeds so transitioned to TPN on 10/20. Anticipate outpatient TPN for at least several weeks before repeat imaging and CT surgery follow up appointment.  - J tube placed by IR on 10/16, holding on tube feeds for now but ok for meds, flush for patency  - Appreciate nutrition assistance for TPN: will add additional volume in TPN tonight from 1.8 L to 2.3 L 10/26 for uptrending BUN/Cr + ordered  for additional 500 cc IVF today  - TPN initiated 10/20, now cycled  [ ]  need to determine outpatient provider / agency that can provide this, to be discussed with CM after weekend  - S/p IV thiamine  200 mg x 3, followed by VT thiamine  100 mg daily - MVI now will be included in TPN  - monitor for refeeding: K, phos, Mg until feeds are at goal - daily while on TPN  - trend weights - post JT pain: scheduled tylenol , d/c PRN oxy 10/26 given no use x48 hours   Normocytic Anemia Hgb low at 7.8 on admission, dropped further . Recent baseline Hgb since 10/2023 in Care Everywhere ~9-10. Reportedly required 1 unit pRBC at OSH, no evidence of bleeding. B12 wnl, ferritin 351 and %transferrin sat 351. Possibly 2/2 sepsis or anemia of inflammation, may also have component of blood loss anemia from frequent lab draws since he has been hospitalized for 1 week.  - follow CBC q48h - transfusion goal Hgb > 7 - monitor for bleeding  BPH Concern for urinary retention Patient reports pain and straining with urination over the past couple of days while off his flomax . He reports a history of urinary retention in the past while hospitalized. PVRs persistently ~200s despite attempting to urinate more and with a lot of difficulty, so Foley placed 10/13. - keep Foley for now   - Started Doxazosin , passed TOV.  [ ]  will need RX for doxazosin  on dc   Esophageal cancer s/p esophagectomy with reconstruction and gastric anastomosis and pull-through Patient follows with Oncology at [facility] long, per chart review started on immunotherapy in September. - outpatient f/u   Diet-controlled type 2 diabetes A1C 7. - monitor POC glucose q6h  VTE Prophylaxis  + Anticoagulant Ordered  enoxaparin , 40 mg, Subcutaneous, Daily, 40 mg at 03/14/24 9157      # Code Status: Full Code   Discharge Planning: Expected Discharge Date: 03/16/2024  Current Activity: Walks occasionally (03/13/24 2110) Current Mobility: No limitation  (03/13/24 2110) Anticipated Discharge Destination: Home Anticipated Discharge Needs: TBD PT DC recommendation: Home;Home Health PT (03/12/24 0944) Follow-up appointments scheduled:   No future appointments.   Attestation Statement:   I personally performed the service. (TP) Reviewed the following unique tests: CBC, RFP, Mg, Independently interpreted test CXR which shows stable bilateral opacities, and Discussed management of TPN/IVF status with RD and Treating the patient for persistent esophageal leak s/p stenting requiring parental nutrition that poses a threat to life or bodily functions. This increases the complexity of data to be reviewed and analyzed and the risk of complications/mortality/morbidity of patient management    Vivian Chen-Andrews,  MD Hospital Medicine [facility]

## 2024-03-15 NOTE — Care Plan (Signed)
 Pt alert and oriented x 4. VSS, remains afebrile, except for BP of 146/86 @ 2123 and 155/80 @ 2330. Received pt on RA and remains on RA, with saturation in the high 90's. TPN started around 2130. Pt is tolerating it well. Denied pain and declined getting the scheduled Tylenol . Continued to void without any problem. Will continue to monitor.   Problem: Respiratory: Goal: Respiratory status will improve by discharge. Outcome: Progressing   Problem: Blood glucose imbalance Goal: Patient's blood glucose will remain within expected range Outcome: Progressing  Pt's blood sugar around midnight 225 mg/dl  and 772 mg/dl around 9478.The correctional dose insulin  given per [facility].  Will continue to monitor.  Problem: Nutritional: Goal: Signs and symptoms of  aspiration will decrease Outcome: Progressing   Problem: Risk for Infection: Goal: Will remain free from infection Outcome: Progressing   Problem: Electrolyte Imbalance: Goal: Electrolyte Imbalance will  improve by discharge Outcome: Progressing Goal: Patient   will understand  individual risk factors and will adopt life style behaviors to prevent or reduce frequency of electrolyte imbalance. Outcome: Met/ Completed

## 2024-03-23 LAB — LAB REPORT - SCANNED: EGFR: 94

## 2024-03-24 ENCOUNTER — Inpatient Hospital Stay

## 2024-03-24 ENCOUNTER — Ambulatory Visit

## 2024-03-24 ENCOUNTER — Inpatient Hospital Stay: Admitting: Oncology

## 2024-03-24 ENCOUNTER — Encounter: Payer: Self-pay | Admitting: Thoracic Surgery (Cardiothoracic Vascular Surgery)

## 2024-03-31 NOTE — Discharge Summary (Addendum)
 "  [facility] Medicine Discharge Summary  Admit Date: 03/30/2024 Discharge Date: 04/26/2024  Admitting Physician: Mackey Christinia Clayman, MD Discharge Physician: Dominica Chiquita Niemann, MD  Primary Care Provider: Marelyn Axon, MD, Phone [phone]  Discharge Destination: Hospice  Admission Diagnoses:  Acute dyspnea [R06.00]  Discharge Diagnoses:  Principal Problem:   Aspiration pneumonia of both lower lobes (CMS/HHS-HCC) Active Problems:   CKD (chronic kidney disease) stage 3, GFR 30-59 ml/min (CMS-HCC)   Esophageal anastomotic leak   Esophageal cancer (CMS/HHS-HCC)   Malnutrition of moderate degree (HHS-HCC) Resolved Problems:   * No resolved hospital problems. *  Primary Diagnosis: Admitted for acute hypoxic respiratory failure secondary to aspiration with complex empyema requiring chest tube placement. GOC conversation held - patient opted to transition to comfort care and is discharged to hospice.   Changes Made (with rationale):  Discontinued all medications not focused on comfort Added pain regimen as below  To-Do List (incidental findings, follow-up studies, etc.): N/a  Anticipatory Guidance for Outpatient Care:  N/a     Results Pending at Discharge:  None Please see phone numbers at end of this summary for lab contact information.   Follow-up/Care Transition Plan: Sched. appts: Future Appointments  Date Time Provider Department Center  05/24/2024 10:00 AM Mardy Rexene Hun, PA [facility]    Follow-up info: No follow-up provider specified.     Allergies/Intolerances:  No Known Allergies   New Adverse Drug Events: none  Medications:     Current Discharge Medication List     START taking these medications      Instructions  buprenorphine 150 mcg buccal film Quantity: 60 Film Refills: 0  Commonly known as: BELBUCA Place 1 Film (150 mcg total) inside cheek every 12 (twelve) hours Last time this was given: Ask your  nurse or doctor   buprenorphine 150 mcg buccal film Refills: 0  Commonly known as: BELBUCA Place 1 Film (150 mcg total) inside cheek 3 (three) times daily Last time this was given: Ask your nurse or doctor   HYDROmorphone  0.5 mg/0.5 mL inj syringe Refills: 0 Stop taking on: May 01, 2024  Commonly known as: DILAUDID  Inject 0.5 mg into the vein every 2 (two) hours as needed (air hunger) for up to 5 days Last time this was given: 0.5 mg on April 26, 2024 10:13 AM   ondansetron  (PF) injection Refills: 0  Commonly known as: ZOFRAN  Inject 2 mLs (4 mg total) into the vein every 6 (six) hours as needed Last time this was given: 4 mg on April 21, 2024  1:18 PM   pantoprazole  40 mg injection Refills: 0  Commonly known as: PROTONIX  Inject 10 mLs (40 mg total) into the vein every 12 (twelve) hours Last time this was given: 40 mg on April 25, 2024  8:41 AM   xylitol-yerba santa spray Refills: 0  Commonly known as: MOUTH KOTE Take by mouth as directed (q4h PRN)       STOP taking these medications    acetaminophen  500 MG tablet Commonly known as: TYLENOL    atorvastatin  20 MG tablet Commonly known as: LIPITOR   doxazosin  2 MG tablet Commonly known as: CARDURA    famotidine  20 MG tablet Commonly known as: PEPCID    Home TPN   insulin  REGULAR injection (concentration 100 units/mL) Commonly known as: HumuLIN R          Brief History of Present Illness:  Seth Burch is a 68 y.o. 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 Recent admission from 10/12-10/28/25 admitted for evaluation and management of tracheoesophageal fistula, underwent stenting though still with persistent  leak. Treated for aspiration pneumonia.    He has been experiencing hemoptysis since Saturday night into Sunday, a streak of bright red blood that was about 1-2 inches long. He has a history of esophageal cancer and aspiration pneumonia, and his chemotherapy is currently on hold pending further investigation.He experiences sharp chest pain close to the middle of his sternum, particularly when coughing or taking a deep breath. The pain began around the same time as the hemoptysis. There is no history of blood clots or recent travel. The pain does not worsen with physical activity but is exacerbated by coughing. He has noticed his physical activity and functional ability has decreased since Saturday due to coughing, however he denies any worsening in the frequency or quantity of phlegm/sputum in his cough. On 2L [place] O2 at home.His at home temperature over the weekend reached 28F. Denies feeling feverish or having rigors, endorses occasional chills. His blood sugar levels are high overnight, with recent readings of 320-360 mg/dL, despite using fast-acting insulin , which has not been effective in lowering his blood sugar.He has a history of a fistula for which a stent was placed, but it did not seal the hole. He was discharged home for a few weeks and is now due for an appointment with CT surgery for evaluation of Endovac. He experiences constant abdominal pain since his esophagus was removed and his stomach pulled up, which worsens with coughing. He takes Pepcid  at night but reports it does not alleviate his cough.   In the ED, temperature was 36.6 C (97.9 F), HR (!) 124, BP 104/62, RR 22, and SpO2 94 % on 7L, titrated down to 4L. Labs were notable for lactate 1.5, leukocytes 16.9*, hemoglobin 8.6*, platelets 272, sodium 138, potassium 4.2, magnesium  1.9, calcium  8.4*, ionized calcium   , bicarb 20*, creatinine 1.2, BUN 48*, glucose 284*, anion gap 13*, AST 23, ALT 24, Alk Phos 161*, total bilirubin 0.5, albumin   1.8*, hsTNI 19, hsTNI delta 2, and hsTNI Rule-Out. CT chest shows c/f acute on chronic aspiration/infection. CT additionally shows moderate sized partially  loculated pleural effusion in the left lung. No evidence to suggest leak from stent. He was started on CTX and azithro and admitted for further evaluation and management. _____________________   Hospital Course by Problem:  # Esophageal cancer s/p esophagectomy w/ reconstruction and gastric anastomosis and pull-through 07/2023 # Tracheoesophageal fistula, s/p esophageal stenting x2 complicated by persistent leak  # Comfort care Esophagectomy with reconstruction and gastric pull-through at [facility] March 2025 was c/b tracheoesophageal fistula requiring stenting, though still with persistent leak 08/2023 and later removed 11/2023 (note that patient was supposed to have an appointment with CTS 11/11 re: Endovac placement to possibly help with leak). He re-presented in October with aspiration pneumonia, barium swallow showing TE fistula and underwent stenting with biliary, though still with large, persistent leak. He continues to have persistent leak this admission as demonstrated on repeat barium swallow. Discussed with biliary and CTS regarding both surgical and non-surgical options. Biliary unable to perform any endoscopic intervention given extent of leak and patient is not a good surgical candidate given frailty and nutrition status, although if patient continues to have clinically  significant complications related to fistula, can consider a diversion as a last resort. Deemed not a current surgical candidate by CTS. After further discussion, patient and wife have elected to transition to comfort care, DNAR 12/7. He was started on the below regimen for comfort:  - Dilaudid  0.5mg  q2h prn - Belbuca 150mcg TID - IV Pantoprazole  40mg  BID  - IV zofran  4mg  q6h prn - Continue O2 for comfort    # Acute on chronic hypoxemic respiratory  failure # Acute hypercarbic respiratory failure # Pulmonary edema # HFpEF # S. Aureus PNA with empyema s/p chest tube placement 11/12-11/21 #Sepsis ruled in from S Aureus PNA with empyema w/ pulmonary dysfunction Initially admitted with aspiration pneumonia with empyema due to continued aspiration. Received multiple courses of antibiotics during admission, ultimately stopped 12/7 with transition to comfort care.    #Atrial flutter w/ RVR, resolved Patient developed atrial flutter with RVR. Anticoagulation ultimately stopped due to transition to comfort care.    # TPN via PICC # S/p J-tube exchange 11/26, removal 11/28 # Leakage around prior J tube site, stable TPN initially continued during admission but stopped on 12/7 after transition to comfort care.   # AKI Cr generally wnl throughout admission, increased from 0.9 -> 1.3 from 11/29-12/5. FEurea 60.7% 12/5 c/w intrinsic process. Trace proteinuria, 1+ blood with 4 RBCs on UA. Suspect possible component of ATN given softer pressures during 12/4 RRT.    Malnutrition: He has been diagnosed with severe protein-calorie malnutrition in the context of chronic illness, based on >7.5% unintentional weight loss in 3 months, moderate subcutaneous fat loss, moderate muscle mass loss.  - Recommend parenteral nutrition support, Recommend trend weight status.   Social Drivers of Health with Concerns   Alcohol  Use: Unknown (01/01/2024)   AUDIT-C    Average Number of Drinks: Patient does not drink    Surgeries and Procedures Performed:  Procedure(s): PLEURAL DRAINAGE, PERCUTANEOUS, WITH INSERTION OF INDWELLING CATHETER; WITH IMAGING GUIDANCE 03/31/24 _____________________  Discharge Exam:  BP 138/85 (BP Location: Right upper arm, Patient Position: Lying)   Pulse 85   Temp 36.6 C (97.8 F) (Oral)   Resp 21   Ht 177.8 cm (5' 10)   Wt 80.7 kg (177 lb 14.6 oz)   SpO2 98%   BMI 25.53 kg/m  O2 Device: High flow nasal cannula (04/25/24  1111) Gen:      Alert, no acute distress but chronically ill  HENT:   atraumatic, sclera clear without icterus, external ears normal, MMM CV:       warm Pulm:   normal work of breathing on [place] Abd:      nondistended Ext:       warm, well-perfused. Skin:     no rashes noted Neuro:   conversant  Psych:   appropriate mood and affect    Pertinent Lab Testing: Recent Labs  Lab 04/23/24 2024 04/24/24 0434 04/24/24 0828 04/25/24 0438  NA 146* 145   < > 146*  K 3.7 3.9  --  3.5  CL 109* 110*  --  111*  CO2 28 29  --  29  BUN 43* 43*  --  44*  CREATININE 1.3 1.3  --  1.2  GLUCOSE 126 194*  --  134  CALCIUM  8.5* 8.7  --  8.6*   < > = values in this interval not displayed.   Recent Labs  Lab 04/23/24 0615 04/24/24 0434 04/25/24 0438  AST 19 21 22   ALT 12* 12* 13*  ALKPHOS  244* 272* 348*  TBILI 0.6 0.3* 0.4    Recent Labs  Lab 04/23/24 1520 04/24/24 0434 04/25/24 0438  WBC 11.2* 10.8* 10.7*  HGB 8.1* 6.9* 6.9*  HCT 28.7* 23.5* 23.9*  PLT 391 388 411   No results for input(s): APTT, INR in the last 168 hours.    Other Pertinent Labs:  Reviewed in hospital course above  Micro:  Susceptibility data from last 90 days. Collected Specimen Info Organism Amikacin Amoxicillin  + Clavulanic Acid Ampicillin  Ampicillin  + Sulbactam Cefazolin  Ceftriaxone  Ciprofloxacin Clindamycin Daptomycin Doxycycline Erythromycin Gentamicin Levofloxacin  Linezolid  04/01/24 Other from Sputum Expectorated Escherichia coli  S  S  S  S  S  S  S      S  S     Methicillin Resistant Staphylococcus aureus (MRSA)         S   S  R    S    Oropharyngeal flora                03/31/24 Body Fluid from Pleural Fluid Methicillin Resistant Staphylococcus aureus (MRSA) (2)                  Methicillin Resistant Staphylococcus aureus (MRSA) (4)                03/30/24 Other from Pleural Fluid Methicillin Resistant Staphylococcus aureus (MRSA) (4)         S  S  S  R    S    Methicillin Resistant Staphylococcus  aureus (MRSA) (2)         S  S  S  R    S   Collected Specimen Info Organism Oxacillin Piperacillin  + Tazobactam Tobramycin Trimeth/Sulfa Vancomycin   04/01/24 Other from Sputum Expectorated Escherichia coli   S  S  S     Methicillin Resistant Staphylococcus aureus (MRSA)  R    R  S    Oropharyngeal flora       03/31/24 Body Fluid from Pleural Fluid Methicillin Resistant Staphylococcus aureus (MRSA) (2)         Methicillin Resistant Staphylococcus aureus (MRSA) (4)       03/30/24 Other from Pleural Fluid Methicillin Resistant Staphylococcus aureus (MRSA) (4)  R    R  S    Methicillin Resistant Staphylococcus aureus (MRSA) (2)  R    R  S       Pertinent Imaging:   X-ray chest single view portable Result Date: 04/24/2024 XR CHEST SINGLE VIEW PORTABLE INDICATION: Heart Size, Tube Position, Line Position, Lung Aeration, R06.00 Dyspnea, unspecified, J86.9 Pyothorax without fistula (CMS/HHS-HCC), J90 Pleural effusion, not elsewhere classified, J69.0 Pneumonitis due to inhalation of food and vomit (CMS/HHS-HCC), K91.89 Other postprocedural complications and disorders of digestive system COMPARISON: Comparison exams, the most recent chest radiographs dated 04/23/2024, CT chest 12-4 25 FINDINGS/IMPRESSION: Stable right anterior chest wall internal jugular port catheter. Increased mild pulmonary edema and bibasilar atelectasis. Similar small bilateral pleural effusions. No pneumothorax. Increased size of the cardiac silhouette likely relating to underlying pericardial effusion. Widened vascular pedicle. Esophageal stent. Embolization coil in the left superior mediastinum. Electronically Signed by:  Dale Bob, DO, [facility] Electronically Signed on:  04/24/2024 12:07 PM  X-ray chest single view portable Result Date: 04/23/2024 PROCEDURE:  One-view (anteroposterior projection) chest. INDICATION: Heart Size, Tube Position, Line Position, Lung Aeration, J86.9 Pyothorax without fistula (CMS/HHS-HCC), J90  Pleural effusion, not elsewhere classified, J69.0 Pneumonitis due to inhalation of food and vomit (CMS/HHS-HCC) COMPARISON:  Previous  day FINDINGS AND IMPRESSION:  1. Cardiac and mediastinal silhouette is enlarged and unchanged. 2. Mild edema with bibasilar atelectasis. 3. Small effusions. No pneumothorax. 4. Support appliances are unchanged. Electronically Signed by:  Dorise Pali, MDPhD,[facility] Electronically Signed on:  04/23/2024 8:42 AM  US  lower extremity venous bilateral Result Date: 04/22/2024 Procedure: US  LOWER EXTREMITY VENOUS BILATERAL Indication:  check for DVT, R06.00 Dyspnea, unspecified Comparison: None Technique: Gray-scale ultrasound and color Doppler images of the deep veins of the bilateral lower extremities from the level of the common femoral vein to the level of the popliteal vein. Spectral Doppler evaluation of bilateral common femoral and popliteal veins. Gray-scale and color Doppler images of bilateral posterior tibial and peroneal veins. Findings: Spectral Doppler evaluation of the bilateral common femoral veins demonstrates symmetric waveforms. RIGHT:     Common femoral vein: Compressible. No thrombus seen.     Proximal portion of the deep femoral vein: No thrombus seen.     Proximal portion of the great saphenous vein: No thrombus seen.     Femoral vein: Compressible. No thrombus seen.     Popliteal vein: Compressible. No thrombus seen.     Visualized portions of the posterior tibial and peroneal veins: No thrombus seen. LEFT:     Common femoral vein: Compressible. No thrombus seen.     Proximal portion of the deep femoral vein: No thrombus seen.     Proximal portion of the great saphenous vein: No thrombus seen.     Femoral vein: Compressible. No thrombus seen.     Popliteal vein: Compressible. No thrombus seen.     Visualized portions of the posterior tibial and peroneal veins: No thrombus seen. Impression: 1. No deep vein thrombosis identified in the bilateral  common femoral, femoral, and popliteal veins. 2. No deep vein thrombosis identified in the visualized portions of the bilateral posterior tibial and peroneal veins. Electronically Reviewed by:  Jordan Coburn, MD, [facility] Electronically Reviewed on:  04/22/2024 3:15 PM I have reviewed the images and concur with the above findings. Electronically Signed by:  Olam Rouse, MD, [facility] Electronically Signed on:  04/22/2024 4:28 PM  CT chest PE protocol incl CT angiogram chest w wo contrast Result Date: 04/22/2024 CT CHEST PE  PROTOCOL INCL CT CHEST ANGIOGRAM W WO CONTRAST INDICATION: Mediastinitis, Eval for PE, R07.9 Chest pain, unspecified COMPARISON:  03/30/2024 CT chest PROTOCOL:  Chest CTA PE Protocol.  Contiguous axial images were obtained from the neck base through the upper abdomen following intravenous administration of iodinated contrast material.  If IV contrast material had not been administered, the likelihood of detecting abnormalities relevant to the patient's condition would have been substantially decreased. In addition, 3D images were created and reviewed. 3-D reconstructions were likewise performed and indicated to increase the sensitivity of detecting clinically relevant pathology. FINDINGS: SCOUT IMAGES: Reviewed. PULMONARY ARTERIES: No acute pulmonary embolism. LOWER NECK: Unremarkable thyroid . No axillary or supraclavicular lymphadenopathy. LUNGS/AIRWAYS/PLEURA: Patent central airways. Lower lobe predominant bronchial wall thickening and distal airways mucoid impaction. There lower lung predominant cluster tree-in-bud nodularity with confluent consolidation at the lung bases progressed from prior. Background of diffuse interlobular septal thickening. There are moderate bilateral pleural effusions with an air-fluid level in the left pleural space. Right-sided pleural calcification appears unchanged from prior. There is pronounced pleural enhancement and septations within the left pleural  effusion, better appreciated on the concurrently obtained CT abdomen pelvis. MEDIASTINUM/HEART/VESSELS: Left-sided three-vessel arch. Normal caliber thoracic aorta. A left upper extremity PICC terminates in the right  atrium. Moderate pericardial effusion. Query subtle pericardial enhancement which is better appreciated on the same day CT abdomen pelvis. Enlarging mediastinal and hilar nodes. A reference right paratracheal node measures 1.4 cm (4:130), previously 0.6 cm. An additional precarinal node measures 1.1 cm (4:186), previously 0.5 cm. Mild coronary artery calcifications. Status post esophagectomy with a stent in the esophagus. VISIBLE ABDOMEN: Please see separately dictated same-day abdominopelvic CT for findings below the diaphragm. BONES/SOFT TISSUES: No aggressive appearing osseous lesions. The soft tissues are unremarkable. IMPRESSION: 1.  No acute pulmonary embolism. 2.  Progression in multifocal aspiration/infection compared to prior, superimposed upon a background of moderate pulmonary edema. 3.  A moderate left empyema has decreased from prior. A small right pleural effusion with associated calcification appears similar to prior. 4.  Redemonstrated findings of esophagectomy with a stent in the neoesophagus and no indirect evidence of leak. 5.  Moderate pericardial effusion, increased from prior, possibly with some subtle pericardial enhancement. Correlate with clinical evidence of pericarditis. 6.  Enlarging mediastinal and hilar nodes which are likely reactive. 7.  Please see separately dictated same-day abdominopelvic CT for findings below the diaphragm. Electronically Reviewed by:  Josefa Fruits, MD, [facility] Electronically Reviewed on:  04/22/2024 9:43 AM I have reviewed the images and concur with the above findings. Electronically Signed by:  Emeline Ni, MD, [facility] Electronically Signed on:  04/22/2024 12:00 PM  X-ray chest single view portable Result Date:  04/22/2024 PROCEDURE: XR CHEST SINGLE VIEW PORTABLE INDICATION: Lung Aeration, Heart Size, Disease Progression, R06.00 Dyspnea, unspecified COMPARISON: Chest radiograph 04/20/2024 FINDINGS/IMPRESSION: Unchanged right chest wall port and left upper extremity PICC. Stable position of esophageal stent. Unchanged enlarged cardiomediastinal contours. Low lung volumes and vascular crowding. Similar appearance of bilateral hilar and basilar predominant heterogeneous opacities likely a combination of edema and atelectasis with small bilateral pleural effusions. No definite pneumothorax. The preliminary report (critical or emergent communication) was reviewed prior to this dictation and there are no critical differences between the preliminary results and the impressions in this final report. Electronically Reviewed by:  Mabel Layer, MD, [facility] Electronically Reviewed on:  04/22/2024 10:04 AM I have reviewed the images and concur with the above findings. Electronically Signed by:  Rea Marc, MD, [facility] Electronically Signed on:  04/22/2024 10:32 AM  Echo complete Result Date: 04/22/2024               [facility]                         [place]                             [facility]                              [phone]  DOB: [birth year]  Age: 68                   ECHO-DOPPLER REPORT                             Date: 04/22/2024                                                                                 Male                                                                                      Inpatient                                                                       LOCATION: Patient Room                                                                         MD1: ERIC KEIL POLLAK             STUDY:  ECHO COMPLETE                             SOUND QLTY: Moderate                      ECHO: Yes                                           STRAIN: Yes                          COLOR: Yes                                               3D: Yes                        DOPPLER: Yes  BP: 88 / 55                  RV BIOPSY: No                                                HR: 78 BPM                    CONTRAST: No                                            Height: 70 in                       MEDIUM: N/A                                           Weight: 163 lbs                    MACHINE: CDU - E95-9                                      BSA: 1.9                   ------------------------------------------------------------------------------------------     History: SOB and Effusion     Reason: Assess LV function Assess RV Function Indication: R06.00- Dyspnea, unspecified.                                                       J90- Pleural effusion, not elsewhere classified.                                    R00.0- Tachycardia, unspecified.                                                    CONCLUSION ------------------------------------------------------------------------------- NORMAL LEFT VENTRICULAR SYSTOLIC FUNCTION WITH NO LVH ESTIMATED EF: 55%, CALC EF(3D): 50% ELEVATED LA PRESSURES WITH DIASTOLIC DYSFUNCTION (GRADE 2) MILD RIGHT VENTRICULAR SYSTOLIC DYSFUNCTION VALVULAR REGURGITATION: TRIVIAL AR, TRIVIAL MR, TRIVIAL PR, TRIVIAL TR                   ESTIMATED RVSP: 26 mmHg (Normal) NO VALVULAR STENOSIS                                                                     SMALL PERICARDIAL EFFUSION ------------------------------------------------------------------------------------------  LVEF IS LOWER LIMITS OF NORMAL                                                           MILD RV DYSFUNCTION                                                                      PERICARDIUM: MILD  CIRCUMFERENTIAL EFFUSION  NO PRIOR ECHO FOR COMPARISON                                                                                          ECHOCARDIOGRAPHIC DESCRIPTIONS ----------------------------------------------------------- AORTIC ROOT         Asc Ao Size: Normal                               Dissection: INDETERMINATE FOR DISSECTION                                             AORTIC VALVE            Leaflets: Tricuspid                               Mobility: Fully Mobile                    Morphology: Normal                                                                                    AR: TRIVIAL AR                                    AS: No AS                              AV Mass: No Masses                   LEFT VENTRICLE                Size: SMALL  LVH: None                                Contraction: Normal                               Closest EF: 55%                                     Calc. EF: 50%(3D)                        LV GLS (GE): -13.8%   Normal Range <-18%     Strain Analysis: GLS > -16% Global longitudinal strain is abnormal. As an adjunctive                         assessment of myocardial function, this strain value is consistent                          with subclinical impairment of myocardial contractility in the                              setting of preserved EF.                                                           LV Mass: No Masses                         Dias. FxClass: RELAXATION ABNORMALITY (GRADE 2) CORRESPONDS TO PSEUDONORMAL             WALL MOTION                            Basal             Mid               Apical              Anterior Septum: Normal            Normal            Normal                Anterior Wall: Normal            Normal            Normal                 Lateral Wall: Normal            Normal            Normal               Posterior Wall: Normal             Normal  Inferior Wall: Normal            Normal            Normal              Inferior Septum: Normal            Normal                            Rest Rest Score Index: 1.00 MITRAL VALVE            Leaflets: Normal                                  Mobility: Fully Mobile                    Morphology: Normal                                                                                     MR: TRIVIAL MR                                    MS: No MS                            MV masses: No Masses                   LEFT ATRIUM                Size: MODERATELY ENLARGED                                                                LA masses: No Masses                   MAIN PA                Size: Normal                                  Diameter: 1.9 cm                 PULMONIC VALVE            Leaflets: UNKNOWN                                 Mobility: Fully Mobile                    Morphology: Normal  PR: TRIVIAL PR                                    PS: No PS                            PV masses: No Masses                   RIGHT VENTRICLE                Size: Normal                                 Free Wall: HYPOCONTRACTILE                Contraction: MILD DECREASE                                                 TAPSE: 1.1 cm                                                    RV masses: No Masses                   TRICUSPID VALVE            Leaflets: Normal                                  Mobility: Fully Mobile                    Morphology: Normal                                       TR: TRIVIAL TR                                    TS: No TS                            TV masses: No Masses                   RIGHT ATRIUM                Size: Normal                                RA masses: No Masses                   PERICARDIUM               Fluid: MILD EFFUSION        UNIFORM  Peri Note: MILD  CIRCUMFERENTIAL EFFUSION                                            INFERIOR VENA CAVA                Size: Normal                                 Max Diam: 2.1 cm                            Resp.Collapse: No Respiratory Collapse                                              RESTING ECHOCARDIOGRAPHIC MEASUREMENTS --------------------------------------------------- AORTA Measurements            Values    Units     Normal Range                              Asc.Aorta: 2.6       cm        [2.2 - 3.8]                          Asc. Aorta BSA: 1.4       cm/m2     [1.1 - 1.9]                  LEFT VENTRICLE                  LVIDd: 3.4       cm        [4.2 - 5.8]                                   LVIDs: 2.7       cm        [2.5- 4]                                  LVIDd/BSA: 1.7       cm/m2                                                     SWT: 0.9       cm        [0.6 - 1]                                       PWT: 0.8       cm        [0.6 - 1]  LV EF 3D: 50        %                                                   LV EDV 3D: 104       mL                                                 LV EDV 3Di: 54        mL/m2                                               LV ESV 3D: 52        mL                                                 LV ESV 3Di: 27        mL/m2                                  DIASTOLIC FUNCTION          MV Pk. E Vel.: 79.2      cm/s                                            MV Pk. A Vel.: 44.3      cm/s                                                   MV E/A: 1.8                                                               MV DT: 166       msec                                           MV Med E' Vel.: 7.7       cm/s                                              MV Med E/e': 10.3  MV Lat E'Vel.: 8.6       cm/s                                              MV Lat E/e': 9.2                                                          MV Avg E/e': 9.7       cm/s                                   LEFT ATRIUM                LA Diam: 3.6       cm        [3 - 4]                                     LA Area: 25.1      cm2       [<= 20]                                   LA Volume: 81        ml        [18 - 58]                                      LAVi: 42        ml/m2     [16 - 34]                    RIGHT VENTRICLE               RV TAPSE: 1.1       cm                                                    RV Base: 4.2       cm        [2.5 - 4.1]                                  RV Mid: 4         cm        [1.9 - 3.5]                  RIGHT ATRIUM                RA Area: 14.3      cm2       [ <= 20]  RA Volume: 36        mL                                                       RAVi: 19        mL/m2     [11 - 39]                    Pressures, Gradients, and DOPPLER ECHO --------------------------------------------------- Mitral Valve          MV Pk. E Vel.: 79.2      cm/s                                            MV Pk. A Vel.: 44.3      cm/s                                                   MV E/A: 1.8                                                    MV Inflow E Vel.: 79.2      cm/s                                        MV Annulus E'Vel.: 7.7       cm/s                                                E/E'Ratio: 10                                               Pulmonic Valve                  PV AT: 63        msec                                   Tricuspid Regurgitation Values            TR Pk. Vel.: 2.1       m/s                                               RA Pressure: 8         mmHg  RVSP: 26        mmHg      Peak                         3D acquisition and reconstructions were performed as part of this examination to more accurately quantify the effects of identified structural abnormalities as part of the exam with independent workstation.        Perform By: Kathryne  Nobles, RCS                                                     Res. Person: Kathryne Nobles, RCS                                               Electronically signed by Steffi Thera Marcelyn Nola, M.D. on:04/22/2024 3:42:49 PM with status of Final The images are stored in the [facility] system, please contact the clinical provider for images related to this study.                                                                      CT abdomen pelvis with contrast Result Date: 04/22/2024 CT abdomen and pelvis with IV contrast Comparison:  04/13/2024 CT abdomen pelvis with contrast. 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 Technique:  CT imaging was performed of the abdomen and pelvis following the administration of intravenous contrast.  Iodinated contrast was used due to the indications for the examination, to improve disease detection and further define anatomy. Coronal and sagittal reformatted images were generated and reviewed. Findings: - Lower Thorax: See separate CT chest for findings above the diaphragm. Small pericardial effusion partially visualized with evidence of pericardial enhancement (series 7; image 20). - Liver: Normal in morphology and enhancement.  No suspicious hepatic masses are identified.  The portal and hepatic veins are patent. - Biliary and Gallbladder: No intrahepatic or extrahepatic bile duct dilatation. Small amount of high density material within the gallbladder may reflect sludge versus vicarious excretion of contrast. Mild gallbladder wall thickening. - Spleen: Normal in appearance.  - Pancreas: Normal in appearance. - Adrenal Glands: Normal in appearance. - Kidneys: Symmetric in size and enhancement. Left renal cysts. No hydronephrosis. - Abdominal and Pelvic Vasculature: No abdominal aortic aneurysm. Mild  atherosclerotic plaque. - Gastrointestinal Tract: Partially fluid-filled colon. 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 Interval removal of the J-tube. No evidence of bowel obstruction. Diverticulosis. - Peritoneum/Mesentery/Retroperitoneum: Trace free fluid in the left upper quadrant. Small volume pelvic free fluid.  No free intraperitoneal air. - Lymph Nodes: No retroperitoneal, mesenteric, pelvic, or inguinal lymphadenopathy.  - Bladder: Similar mild circumferential bladder wall thickening. - Pelvic Organs: Unremarkable. - Body Wall: Anasarca. Postprocedural changes in the bilateral groin. - Musculoskeletal:  No aggressive appearing osseous lesions. Impression: 1.  No definite acute findings in  the abdomen or pelvis. In particular, no evidence of intra-abdominal abscess. 2.  Small pericardial effusion with pericardial enhancement suggestive of pericarditis. 3.  Findings of volume overload including anasarca and slightly increased small volume free fluid. 4.  See separate CT chest for findings above the diaphragm. Electronically Reviewed by:  Rumalda Kaplan, MD, [facility] Electronically Reviewed on:  04/22/2024 9:09 AM I have reviewed the images and concur with the above findings. Electronically Signed by:  Morene Fishman, MD, [facility] Electronically Signed on:  04/22/2024 9:18 AM  X-ray abdomen AP portable Result Date: 04/22/2024 XR abdomen AP Comparison: 04/15/2024 Indication: abdominal pain, R06.00 Dyspnea, unspecified Number of views: One view, portable upright abdomen AP Findings: No gas-filled, dilated loops of bowel. No free intraperitoneal gas. Pleural effusions and bibasilar atelectasis. Degenerative changes of the spine. Impression: No gas-filled, dilated loops of bowel to suggest ileus or obstruction. Electronically Signed by:  Redell Dawn, MD, [facility] Electronically Signed on:  04/22/2024 6:50 AM  US  upper extremity venous  left Result Date: 04/21/2024 Procedure: US  UPPER EXTREMITY VEIN LEFT Indication: Left arm swelling, c/f line associated clot w/ PICC, M79.89 Other specified soft tissue disorders Comparison: None Technique: Gray-scale, color Doppler, and spectral waveform tracings of left internal jugular vein, brachiocephalic vein, subclavian vein, axillary vein, and brachial veins. In addition, superficial veins including the basilic veins and cephalic veins in the left upper arm were imaged. Spectral Doppler evaluation of the bilateral subclavian veins. Findings: Spectral Doppler evaluation of the bilateral subclavian veins demonstrates symmetric waveforms. Left upper extremity soft tissue edema. LEFT:   Internal jugular vein: Compressible. No thrombus visualized.   Brachiocephalic vein: No thrombus visualized.   Subclavian vein: Partially visualized basilar catheter. No thrombus visualized.   Axillary vein: Compressible. No thrombus visualized.   Brachial veins: Not visualized peripherally secondary to overlying bandages. Visualized segments are compressible without thrombus visualized.   Visualized portions of the basilic and cephalic veins: The basilic vein is only well-visualized peripherally in which no thrombus is visualized. The cephalic vein is not visualized on the images provided and reviewed. Impression: No deep venous thrombosis identified in the the visualized veins of the left upper extremity. The preliminary report (critical or emergent communication) was reviewed prior to this dictation and there are no critical differences between the preliminary results and the impressions in this final report. Electronically Signed by:  Franky Martin, MD, [facility] Electronically Signed on:  04/21/2024 8:29 AM  X-ray chest single view portable Result Date: 04/20/2024 Procedure: XR CHEST SINGLE VIEW PORTABLE Indication:  Lung Aeration, R06.00 Dyspnea, unspecified Comparisons:  04/19/2024 Findings and Impression: 1. Cardiac  and mediastinal contours within normal limits for AP technique. 2. Unchanged support apparatus. 3. No significant change in small pleural effusions with underlying atelectasis and mild edema. Electronically Signed by:  Emeline Ni, MD, [facility] Electronically Signed on:  04/20/2024 8:58 AM  X-ray chest single view portable Result Date: 04/19/2024 XR CHEST SINGLE VIEW PORTABLE INDICATION: Heart Size, Lung Aeration, Tube Position, Disease Progression, R06.00 Dyspnea, unspecified, J90 Pleural effusion, not elsewhere classified, K91.89 Other postprocedural complications and disorders of digestive system COMPARISON: Most recent prior FINDINGS/IMPRESSION: Stable cardiac silhouette and support devices. No visible pneumothorax. Stable left and increased small right pleural effusions. Increased mid to lower lung predominant linear interstitial opacities likely a combination of atelectasis. Electronically Signed by:  Jayson Sprinkle, MD, [facility] Electronically Signed on:  04/19/2024 1:50 PM  IR G or GJ tube removal Result Date: 04/18/2024 EXAM: Jejunostomy tube removal  INDICATION: Planned replacement Jejunostomy tube leak (CMS/HHS-HCC) [X05.86 (ICD-10-CM)] SEDATION / ANESTHESIA / MEDICATION: Level of sedation/anesthesia: No sedation Sedation / Anesthesia / Medication Administered:  Totals unavailable because the procedure time range is not set FLUOROSCOPY DOSE: Total fluoroscopy time: 0 minutes Dose area product: 0 mGy-cm2 Air kerma: 0 mGy COMPLICATIONS: None immediate PREPROCEDURE: Informed written consent was obtained after a discussion of the risks, benefits, and alternatives to the procedure.  A Time-Out was performed immediately prior to the procedure. TECHNIQUE AND FINDINGS: The patient was positioned supine on the procedure table. The retention balloon of the indwelling enteric tube was deflated. The tube was removed using gentle traction. The tube site was cleaned and dressed. The patient  tolerated this procedure well.   Successful removal of a balloon-retained jejunostomy tube.  Electronically reviewed by: THERSIA EARNIE LYELL, MD I, Colby Ashdown, MD, MPH, attest that I was present for the key elements of the procedure and immediately available.  I reviewed the stored images and concur with the above findings.   X-ray chest single view portable Result Date: 04/16/2024 EXAM: XR CHEST SINGLE VIEW PORTABLE [id number] HISTORY/INDICATION: 68 years-old Male with Heart Size, Lung Aeration, Tube Position, Disease Progression, R06.00 Dyspnea, unspecified COMPARISON: 04/15/2024, CT chest 03/30/2024 FINDINGS: Support apparatus: Right chest port with catheter with tip near the superior cavoatrial junction. Cardiac and mediastinal contours: Unchanged. Esophageal stent. Superior mediastinal embolization coils. Lungs: Bilateral perihilar hazy opacities. Left greater than right linear basilar opacities. Pleural space: Small left pleural effusion. No pneumothorax. Abdomen: Embolization material projects over the spine. IMPRESSION: 1.  Stable support apparatus. 2.  Bilateral pulmonary edema and basilar atelectasis with small left pleural effusion. Electronically Reviewed by:  Eduard Bring, MD, [facility] Electronically Reviewed on:  04/16/2024 10:06 AM I have reviewed the images and concur with the above findings. Electronically Signed by:  Dorise Pali, MDPhD,[facility] Electronically Signed on:  04/16/2024 10:23 AM  X-ray chest single view portable Result Date: 04/15/2024 XR CHEST SINGLE VIEW PORTABLE INDICATION: Heart Size, Lung Aeration, Tube Position, Disease Progression, J86.9 Pyothorax without fistula (CMS/HHS-HCC), J90 Pleural effusion, not elsewhere classified, J69.0 Pneumonitis due to inhalation of food and vomit (CMS/HHS-HCC), K91.89 Other postprocedural complications and disorders of digestive system. COMPARISON: April 09, 2024 chest radiograph FINDINGS/IMPRESSION: 1.   Unchanged support apparatus. 2.  Stable upper level normal cardiac and mediastinal contours. 3.  Similar bibasilar heterogeneous opacities possibly representing atelectasis or sequela of aspiration. 4.  Small left greater than right pleural effusion.  No pneumothorax. Electronically Reviewed by:  Yocelin Joya Sosa, MD, [facility] Electronically Reviewed on:  04/15/2024 11:42 AM I have reviewed the images and concur with the above findings. Electronically Signed by:  Jayson Sprinkle, MD, [facility] Electronically Signed on:  04/15/2024 3:43 PM  X-ray abdomen AP portable Result Date: 04/15/2024 XR abdomen AP Comparison: 04/14/2024. Indication: abdominal pain, K94.13 Enterostomy malfunction (CMS/HHS-HCC) Number of views: One view, portable supine abdomen AP Findings: Enteric contrast present throughout the colon. Jejunostomy tube projects over the left lower quadrant. Paucity of small bowel gas. See same day chest for findings above the diaphragm. Lymphangiogram contrast present in bilateral groins and overlying the lower thoracic spine. Impression: Jejunostomy tube projects over the left lower quadrant of the abdomen. Enteric contrast present throughout the colon. Paucity of small bowel gas limits evaluation. 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 Partially visualized jejunostomy tube overlies the left lower quadrant, with tip not in view of this exam. Majority the pelvis is excluded from view limiting evaluation. Paucity of bowel gas. Limited  evaluation for pneumoperitoneum on this supine exam. 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  Sedation / Anesthesia / Medication Administered:  iopamidoL  (ISOVUE -300) 300 mg iodine /mL (61 %) injection 15 mL - 15 mL ondansetron  (PF) (ZOFRAN ) injection - 4 mg midazolam  (VERSED ) 1 mg/mL injection - 1 mg fentaNYL  (PF) (SUBLIMAZE ) injection - 50 mcg (Totals for administrations occurring from 1010 to 1036 on 04/14/24) FLUOROSCOPY DOSE: Total fluoroscopy time: 1.3 minutes Dose area product: 2930 mGy-cm2 Air kerma: 11.95 mGy COMPLICATIONS: None immediate PREPROCEDURE: Informed written consent was obtained after a discussion of the risks, benefits, and alternatives to the procedure. A pre-procedure assessment was documented in the medical record. A Time-Out was performed immediately prior to the procedure. TECHNIQUE AND FINDINGS: The patient was positioned supine on the fluoroscopy table. The abdomen was prepped and draped in the usual sterile fashion. Fluoroscopy was used to demonstrate positioning of the indwelling jejunostomy tube. A guidewire was inserted through the tube and coiled in the small bowel under fluoroscopic guidance. The retention balloon was deflated and the tube was removed over a guidewire. A new jejunostomy tube was cut to appropriate length and advanced over the guidewire. The balloon was inflated  to 3 mL and pulled back to the jejunostomy. The external retention disc was cinche to provide a snug pexy. Intraluminal position was confirmed fluoroscopically. The patient tolerated the procedure well.    1. Successful exchange of a balloon-retained 12 French Enfit jejunostomy tube.  2. The tube is ready for use. Electronically reviewed by: LAMAR BERNARDINO GU, MD PHD I, Adina Shoulder, MD, attest that I was present for the key elements of the procedure and immediately available.  I reviewed the stored images and concur with the above findings.   CT abdomen pelvis with contrast Result Date: 04/13/2024 CT abdomen and pelvis with IV contrast Comparison:  CT abdomen and pelvis 03/07/2024 and 02/23/2024. 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 Technique:  CT imaging was performed of the abdomen and pelvis following the administration of intravenous contrast.  Iodinated contrast was used due to the indications for the examination, to improve disease detection and further define anatomy. Coronal and sagittal reformatted images were generated and reviewed. Findings: - Lower Thorax: Similar consolidation within the right lung base. Nonloculated left pleural effusion with pleural enhancement and gas concerning for empyema, new from prior. - Liver: Normal in  morphology and enhancement.  No suspicious hepatic masses are identified.  The portal and hepatic veins are patent. - Biliary and Gallbladder: No intrahepatic or extrahepatic bile duct dilatation. The gallbladder contains radiopaque densities that may reflect gallstones, sludge vs vicarious  excretion of contrast. No evidence of acute cholecystitis. - Spleen: Normal in appearance.  - Pancreas: Atrophic appearance. - Adrenal Glands: Normal in appearance. - Kidneys: Symmetric in enhancement. No suspicious renal lesions. Peripelvic cyst in the right kidney is unchanged. Partial visualized hydropneumothorax within the left lung base. - Abdominal and Pelvic Vasculature: No abdominal aortic aneurysm. Mild atherosclerotic plaque. - Gastrointestinal Tract: No abnormal dilation or wall thickening. Partially imaged gastric pull-through. Contrast within the colon. Normal appendix. - Peritoneum/Mesentery/Retroperitoneum: Mild increase in free fluid.  No free intraperitoneal air.  Small amount of stranding in the right pericolic gutter. - Lymph Nodes: No retroperitoneal, mesenteric, pelvic, or inguinal lymphadenopathy.  - Bladder: Interval removal of indwelling Foley catheter with slight interval decrease in urinary bladder wall thickening. - Pelvic Organs: Unremarkable. - Body Wall: Interval retraction of the gastrojejunostomy tube with the retention balloon within the soft tissues of the abdomen. Postsurgical changes in the inguinal regions. - Musculoskeletal:  No aggressive appearing osseous lesions. Impression: 1.  Findings worrisome for empyema on the left where nonloculated free fluid was present previously. 2.  Interval retraction of GJ tube with retention balloon in the subcutaneous fat of the anterior abdominal wall. Consider repositioning. Findings were Honora Ramus, MD and a by Kristie Quaker, MD at 04/13/2024 10:25 AM. Electronically Reviewed by:  Larri Quaker, MD, [facility] Electronically Reviewed on:  04/13/2024 10:32 AM I have reviewed the images and concur with the above findings. Electronically Signed by:  Joesph JONETTA Luis, MD, [facility] Electronically Signed on:  04/13/2024 12:00 PM  X-ray abdomen flat and upright Result Date: 04/12/2024 XR abdomen AP Comparison: Abdominal  radiograph 03/05/2024, CT abdomen and pelvis 03/07/2024 Indication: Abdominal tenderness, eval free air, ileus, R10.13 Epigastric pain Number of views: One view, supine and upright abdomen AP Findings/Impression: Jejunostomy tube projects over the left lower quadrant. Paucity of bowel gas limits evaluation of bowel gas pattern. No evidence of free air on upright images. Left greater than right lower lung opacities likely reflect atelectasis and/or aspiration. No acute osseous abnormality. Degenerative changes of the spine. Vascular calcific lesions present. Densities projecting over the bilateral femoral heads likely postprocedural from prior hernia repairs. The preliminary report (critical or emergent communication) was reviewed prior to this dictation and there are no substantial differences between the preliminary results and the impressions in this final report.  Electronically Reviewed by:  Margurite Hope, MD, [facility] Electronically Reviewed on:  04/12/2024 8:56 AM I have reviewed the images and concur with the above findings. Electronically Signed by:  Edsel Isaacs, MD, [facility] Electronically Signed on:  04/12/2024 9:19 AM  X-ray chest single view portable Result Date: 04/09/2024 Procedure: XR CHEST SINGLE VIEW PORTABLE Indication: Lung Aeration, J90 Pleural effusion, not elsewhere classified Compare: April 09, 2024. CONCLUSIONS: Left pleural chest tube removed. Unchanged small left pleural effusion. Negative for pneumothorax. Stable appearance to central catheters, esophageal stent, embolization material noted overlying the mediastinum. Stable cardiomediastinal contours. Similar bibasilar heterogeneous opacities possibly atelectasis or sequela of aspiration. Electronically Signed by:  Chauncy Armour, MD, [facility] Electronically Signed on:  04/09/2024 10:56 AM  X-ray chest single view portable Result Date: 04/09/2024 XR CHEST SINGLE VIEW PORTABLE INDICATION: Lung Aeration, J86.9  Pyothorax without fistula (CMS/HHS-HCC) COMPARISON: 04/07/2024 FINDINGS/IMPRESSION: 1.  Stable support apparatus. 2.  Stable cardiac and mediastinal contours . 3.  Increased basilar predominant heterogeneous opacities favored to reflect worsening pulmonary edema. 4.  Stable small left greater than right pleural effusions. No sizable pneumothorax. Electronically Signed by:  Silvano Borer, MD, [facility] Electronically Signed on:  04/09/2024 9:54 AM  X-ray barium swallow Result Date: 04/07/2024 ESOPHAGRAM Indication: Assess for esophageal leak, K91.89 Other postprocedural complications and disorders of digestive system Comparison: Barium swallow 03/03/2024 Technique: Esophagram to rule out leak was performed under fluoroscopic guidance with videography. A scout film was obtained. Orally administered contrast is: 1. 100 mL ISOVUE -300 2. Approximately 150 mL of LIQUID E-Z-PAQUE FT 00:43 DAP 922.28 uGym2 Air Kerma 29.90 mGy FINDINGS: Scout radiograph demonstrates no pneumomediastinum. Small bilateral pleural effusions with a left pigtail pleural catheter in place. A right IJ central venous catheter terminates near the superior cavoatrial junction. Esophageal stent in place. After administration of oral contrast, the majority of contrast passes through the esophageal stent and gastric pull-through. However, there is noted extraluminal accumulation of contrast in the right lower lobe/pleural space with suspected defect at the inferior aspect of the stent. Additional contrast possibly tracking outside the right lateral aspect of the stent. Postprocedural chest radiograph demonstrates contrast within the right lower lobe airways. There is no apparent communication between the left lung/pleural space with the esophagus to suggest leak or fistula. IMPRESSION: 1.  Leakage of oral contrast into the right lower lobe with suspected defect along the mid right lateral neoesophagus and contrast possibly tracking outside the  right lateral aspect of the esophageal stent. 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 Component of aspiration is less likely as no contrast is seen in the trachea. No fluoroscopic findings to suggest leak or fistula into the left lung/pleural space. Follow-up imaging with CT chest as clinically indicated. Findings were discussed via Epic secure message with HERMENIA DAWN MD by Elston Mcbride, MD at 04/07/2024 2:10 PM. Electronically Reviewed by:  Devin Cao, MD, [facility] Electronically Reviewed on:  04/07/2024 2:23 PM I have reviewed the images and concur with the above findings. Electronically Signed by:  Olam Rouse, MD, [facility] Electronically Signed on:  04/07/2024 3:22 PM  X-ray chest single view portable Result Date: 04/07/2024 Exam: XR CHEST SINGLE VIEW PORTABLE Indication:Lung Aeration, R06.00 Dyspnea, unspecified Comparison: Chest x-ray dated April 05, 2024 Findings/impression: Stable support devices. Cardiac and mediastinal contours are unchanged. Mild bibasilar opacities, likely atelectasis. Stable small left pleural effusion. No evidence of pneumothorax. Electronically Signed by:  Rea Marc, MD, [facility] Electronically Signed on:  04/07/2024 8:20 AM  X-ray chest single view portable Result Date: 04/06/2024 Procedure:  One-view (anteroposterior projection) chest. Indication: Lung Aeration, R06.00 Dyspnea, unspecified Compare: 04/06/2024. Findings and Impression: 1. Stable support apparatus. 2. Stable bilateral heterogeneous lung opacities and persistent left-sided pleural effusion, no pneumothorax. Electronically Signed by:  Hendricks Saint, MD, [facility] Electronically Signed on:  04/06/2024 3:16 PM  X-ray chest single view portable Result Date: 04/06/2024 Procedure:  One-view (anteroposterior projection) chest. Indication: Lung Aeration, R06.00 Dyspnea, unspecified Compare: 04/05/2024. Findings and Impression: 1. Stable support  apparatus. 2. Possibly some slight worsening mild edema or bronchitis with a stable left-sided pleural effusion. No definite pneumothorax. Electronically Signed by:  Hendricks Saint, MD, [facility] Electronically Signed on:  04/06/2024 10:55 AM  X-ray chest single view portable Result Date: 04/06/2024 XR CHEST SINGLE VIEW PORTABLE INDICATION: Disease Progression, J90 Pleural effusion, not elsewhere classified COMPARISON: Chest radiograph 04/04/2024 FINDINGS/IMPRESSION: 1.  Unchanged support apparatus including left upper extremity approach PICC terminating in the right atrium. Stable position of esophageal stent. Embolization material projects over the upper mediastinum. 2.  Increased retrocardiac opacities, most likely combination of pleural fluid and atelectasis. Improved right basilar lung aeration. Otherwise no new focal airspace opacities. 3.  Decreased size of loculated left pleural effusion with residual small volume layering pleural fluid. No pneumothorax. 4.  Stable cardiomediastinal silhouette. The preliminary report (critical or emergent communication) was reviewed prior to this dictation and there are no critical differences between the preliminary results and the impressions in this final report. Electronically Reviewed by:  Devin Cao, MD, [facility] Electronically Reviewed on:  04/06/2024 9:49 AM I have reviewed the images and concur with the above findings. Electronically Signed by:  Jayson Sprinkle, MD, [facility] Electronically Signed on:  04/06/2024 10:50 AM  X-ray chest single view portable Result Date: 04/04/2024 XR CHEST SINGLE VIEW PORTABLE INDICATION: Disease Progression, R06.00 Dyspnea, unspecified, J86.9 Pyothorax without fistula (CMS/HHS-HCC), J90 Pleural effusion, not elsewhere classified COMPARISON: Chest radiograph 04/03/2024 FINDINGS: Stable support devices. Redemonstrated coil embolization material overlying the upper mediastinum. Unchanged esophageal stent.  Unchanged cardiomediastinal silhouette. Redemonstrated large left pleural effusion with a a few small locules of air which are favored to be secondary to instrumentation. IMPRESSION: Stable large left loculated hydropneumothorax. Electronically Reviewed by:  Norleen Quale, MD, [facility] Electronically Reviewed on:  04/04/2024 2:31 PM I have reviewed the images and concur with the above findings. Electronically Signed by:  Rea Marc, MD, [facility] Electronically Signed on:  04/04/2024 2:31 PM  X-ray chest single view portable Result Date: 04/03/2024 Procedure:  One-view (anteroposterior projection) chest. Indication: Lung Aeration, J69.0 Pneumonitis due to inhalation of food and vomit (CMS/HHS-HCC) Compare: 04/01/2024. Findings and Impression: 1. Stable support apparatus. 2. Persistent and complex large left-sided hydropneumothorax. Electronically Signed by:  Hendricks Saint, MD, [facility] Electronically Signed on:  04/03/2024 7:35 AM  X-ray chest single view portable Result Date: 04/01/2024 XR CHEST SINGLE VIEW PORTABLE INDICATION: Disease Progression, Line Position, Other ( add clinical information to comment box below), R06.00 Dyspnea, unspecified COMPARISON: Most recent prior FINDINGS/IMPRESSION: Right internal jugular port catheter, left upper extremity PICC and left detail pleural catheter are unchanged. No change since the recent radiograph, decrease in the left pleural effusion compared to the CT of 03/30/2024. No pneumothorax. Electronically Signed by:  Caron Shove, MD, [facility] Electronically Signed on:  04/01/2024 11:02 AM  X-ray chest single view portable Result Date: 03/31/2024 XR CHEST SINGLE VIEW PORTABLE INDICATION: Tube Position, J90 Pleural effusion, not elsewhere classified COMPARISON: 03/13/2024 chest x-ray and CT performed yesterday FINDINGS/IMPRESSION: Stable chest port catheter and left PICC line with tips near the superior cavoatrial junction. Moderate  loculated left pleural effusion similar to mildly decreased since yesterday's CT with a new left basilar chest tube in place. Decreased right pleural effusion and similar to decreased right basilar atelectasis/consolidation since yesterday's CT. Electronically Signed by:  Jayson Sprinkle, MD, [facility] Electronically Signed on:  03/31/2024 3:45 PM  US  POC thoracic images Result Date: 03/30/2024 Large left pleural effusion with significant loculations. Marked for diagnostic thoracentesis on the left. See corresponding procedure note.   CT chest PE protocol incl CT angiogram chest w wo contrast Result Date: 03/30/2024 CT CHEST PE  PROTOCOL INCL CT CHEST ANGIOGRAM W WO CONTRAST INDICATION: Pulmonary embolism (PE) suspected, high prob, R06.00 Dyspnea, unspecified COMPARISON EXAMS:  CT chest outside interpretation 02/23/2024 and CT abdomen pelvis 03/07/2024 Protocol:  Chest CTA PE Protocol.  Contiguous 1.25 mm axial images were obtained from the neck base through the upper abdomen following intravenous administration of iodinated contrast material.  If IV contrast material had not been administered, the likelihood of detecting abnormalities relevant to the patient's condition would have been substantially decreased.  3-D reconstructions were likewise performed and indicated to increase the sensitivity of detecting clinically relevant pathology. FINDINGS: PULMONARY ARTERIES: Adequate contrast opacification of the main pulmonary artery. No evidence of acute pulmonary embolism. Suboptimal evaluation of the left lower lobe segmental and subsegmental branches due to adjacent atelectasis. LUNGS/AIRWAYS/PLEURA: Central airways are patent. Moderate sized loculated left pleural effusion is unchanged from more recent CT abdomen pelvis with increased conspicuity of adjacent mild left pleural thickening and enhancement. Calcification of the right posterior pleura with small effusion. No pneumothorax. Partial  atelectasis of the left lower lobe. Persistent right lower lobe atelectasis/scarring with volume loss and bronchiectasis. Partial resolution of previously seen tree-in-bud nodularity with new scattered areas of solid/subsolid pulmonary nodules including reference 6 mm nodule in the right upper lobe (6, 241). MEDIASTINUM: Unremarkable visualized thyroid  and neck base. No axillary, mediastinal, or hilar adenopathy. Borderline enlarged right hilar node (6, 229) measuring up to 10 mm is unchanged. New left cardiophrenic and anterior mediastinal fat stranding. 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 HEART/VESSELS: Stable heart size. No pericardial effusion. Minimal coronary at this carotid calcifications. Right chest wall port and left upper extremity approach PICC terminating at the superior cavoatrial junction. Normal caliber thoracic aorta and main pulmonary artery. Mild thoracic aortic vascular calcifications. VISIBLE ABDOMEN: Unchanged right adrenal nodularity. Left renal parapelvic cysts. Postembolization changes of the visualized retroperitoneal lymphatics. BONES/SOFT TISSUES/OTHER: Unremarkable soft tissues. No aggressive appearing osseous lesions. Degenerative changes spine. IMPRESSION: 1.  No pulmonary embolism. 2.  Moderate sized partially loculated left pleural effusion with mild pleural thickening/enhancement and new adjacent cardiophrenic fat stranding, suggestive of an exudative component. Correlate with clinical exam and consider fluid sampling if indicated. 3.  Mixed resolving and new pulmonary nodules/tree-in-bud opacities are suggestive of ongoing acute on chronic aspiration/infection. 4.  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 Electronically Reviewed by:  Devin Cao, MD, [facility] Electronically Reviewed on:  03/30/2024 11:18 AM I have reviewed the images and  concur with the above findings. Electronically Signed by:  Caron Shove, MD, [facility] Electronically Signed on:  03/30/2024 11:51 AM  _____________________  Code Status: DNAR Goals of care were addressed during this admission: and patient transitioned to comfort care as above.   Status on Discharge:  Current activity: Bedfast (04/25/24 0940) Current mobility: Very limited (04/25/24 0940)  Activity Recommendation: activity as tolerated  Other Discharge Instructions: Services setup at discharge: inpatient hospice Tubes/lines at discharge: PIV, PICCs  Diet: DIET NPO Effective Now  Wound Care Order Instructions             Wound Care  As directed       Comments: Surrounding skin  Clean with vashe, pat dry and reapply gauze dressing. Surrounding skin:  1.Gently clean top layer of drainage off with warm water .   2.If skin visible, clean with Vashe (SAP [id number]), pat dry. 3.Apply a thick layer of Barrier Cream to all raw skin. If tube site continues to leak large amounts: Apply a pediatric urostomy pouch over the pouch opening: 1. Cut wafer opening at the 15mm mark 2. Clean skin, pat dry 3. Apply a light layer of stoma powder (DJE#[id number])  to surrounding skin and seal with no sting barrier film (DJE#[id number])  Question Answer Comment  Body Site Dressing at old Jtube site: Change daily and prn for saturation   Instructions Use Convatec SensiCare Adhesive Releaser (sap# [id number]) when removing adhesive dressing from skin.         Wound Care  As directed       Comments: Clean under the J tube with normal saline and allow to dry.  Apply a light layer of stoma powder and spray with no sting barrier spray.  Cover with a Mepilex UP foam([id number]) and change every shift.  Question:  Body Site  Answer:  J tube              _____________________  Time spent on discharge process: 45 minutes    CHIQUITA ZEBEDEE SAUERS, MD [facility]  04/25/2024   Hospital  Contact Information:  [facility]) [facility]) [employer] [facility])  Pending tests:  Laboratory: [phone] Microbiology: [phone] Pathology: [phone] Radiology: [phone]  General questions: [phone] Pending tests: Laboratory: [phone] Microbiology: [phone] Pathology: [phone] Radiology: [phone]  General questions:  [phone] Pending tests:  Laboratory: [phone] Microbiology: [phone] Pathology: [phone] Radiology: [phone]  General questions:  [phone] Pending tests: Laboratory: [phone]) [phone] Microbiology: [phone] Pathology: [phone] Radiology: [phone]  General questions:  [phone]    "

## 2024-04-07 ENCOUNTER — Ambulatory Visit: Admitting: Oncology

## 2024-04-07 ENCOUNTER — Ambulatory Visit

## 2024-04-07 ENCOUNTER — Other Ambulatory Visit

## 2024-05-20 DEATH — deceased

## 2024-07-13 ENCOUNTER — Ambulatory Visit
